# Patient Record
Sex: Male | Born: 1945
Health system: Southern US, Community
[De-identification: ages and names within clinical notes are randomized; demographics above are authoritative.]

## PROBLEM LIST (undated history)

## (undated) DIAGNOSIS — Z85528 Personal history of other malignant neoplasm of kidney: Secondary | ICD-10-CM

## (undated) DIAGNOSIS — I739 Peripheral vascular disease, unspecified: Secondary | ICD-10-CM

## (undated) DIAGNOSIS — C679 Malignant neoplasm of bladder, unspecified: Secondary | ICD-10-CM

## (undated) DIAGNOSIS — F32A Depression, unspecified: Secondary | ICD-10-CM

## (undated) DIAGNOSIS — J189 Pneumonia, unspecified organism: Secondary | ICD-10-CM

## (undated) DIAGNOSIS — F419 Anxiety disorder, unspecified: Secondary | ICD-10-CM

## (undated) DIAGNOSIS — J449 Chronic obstructive pulmonary disease, unspecified: Secondary | ICD-10-CM

## (undated) DIAGNOSIS — K219 Gastro-esophageal reflux disease without esophagitis: Secondary | ICD-10-CM

## (undated) DIAGNOSIS — F329 Major depressive disorder, single episode, unspecified: Secondary | ICD-10-CM

## (undated) HISTORY — PX: CHOLECYSTECTOMY: SHX55

## (undated) HISTORY — PX: HERNIA REPAIR: SHX51

## (undated) HISTORY — DX: Pneumonia, unspecified organism: J18.9

## (undated) HISTORY — DX: Personal history of other malignant neoplasm of kidney: Z85.528

## (undated) HISTORY — DX: Malignant neoplasm of bladder, unspecified: C67.9

## (undated) HISTORY — PX: TONSILLECTOMY: SUR1361

---

## 1994-03-25 HISTORY — PX: PROSTATECTOMY: SHX69

## 1994-03-25 HISTORY — PX: CYSTECTOMY W/ CONTINENT DIVERSION: SUR360

## 1997-08-15 ENCOUNTER — Ambulatory Visit (HOSPITAL_COMMUNITY): Admission: RE | Admit: 1997-08-15 | Discharge: 1997-08-16 | Payer: Self-pay | Admitting: Surgery

## 1998-03-25 HISTORY — PX: COLONOSCOPY: SHX174

## 2002-01-08 ENCOUNTER — Inpatient Hospital Stay (HOSPITAL_COMMUNITY): Admission: EM | Admit: 2002-01-08 | Discharge: 2002-01-14 | Payer: Self-pay

## 2002-04-29 ENCOUNTER — Ambulatory Visit (HOSPITAL_COMMUNITY): Admission: RE | Admit: 2002-04-29 | Discharge: 2002-04-29 | Payer: Self-pay | Admitting: Gastroenterology

## 2005-02-11 ENCOUNTER — Emergency Department: Payer: Self-pay | Admitting: Emergency Medicine

## 2005-02-11 ENCOUNTER — Other Ambulatory Visit: Payer: Self-pay

## 2005-09-20 ENCOUNTER — Emergency Department: Payer: Self-pay | Admitting: Emergency Medicine

## 2007-09-16 ENCOUNTER — Other Ambulatory Visit: Payer: Self-pay

## 2007-09-16 ENCOUNTER — Emergency Department: Payer: Self-pay | Admitting: Emergency Medicine

## 2008-06-12 ENCOUNTER — Emergency Department: Payer: Self-pay | Admitting: Emergency Medicine

## 2010-09-25 ENCOUNTER — Inpatient Hospital Stay: Payer: Self-pay | Admitting: Internal Medicine

## 2011-03-29 ENCOUNTER — Emergency Department: Payer: Self-pay | Admitting: *Deleted

## 2011-03-29 LAB — COMPREHENSIVE METABOLIC PANEL
Alkaline Phosphatase: 70 U/L (ref 50–136)
Bilirubin,Total: 0.6 mg/dL (ref 0.2–1.0)
Calcium, Total: 9.4 mg/dL (ref 8.5–10.1)
Chloride: 109 mmol/L — ABNORMAL HIGH (ref 98–107)
Co2: 21 mmol/L (ref 21–32)
Creatinine: 1.08 mg/dL (ref 0.60–1.30)
EGFR (African American): >60
EGFR (Non-African Amer.): >60
Osmolality: 281 (ref 275–301)
Potassium: 4.2 mmol/L (ref 3.5–5.1)
SGPT (ALT): 18 U/L

## 2011-03-29 LAB — CBC
HCT: 48.2 % (ref 40.0–52.0)
MCV: 90 fL (ref 80–100)
Platelet: 158 10*3/uL (ref 150–440)
RBC: 5.37 10*6/uL (ref 4.40–5.90)
WBC: 6.7 10*3/uL (ref 3.8–10.6)

## 2011-03-29 LAB — TROPONIN I: Troponin-I: <0.02 ng/mL

## 2011-04-12 ENCOUNTER — Inpatient Hospital Stay: Payer: Self-pay | Admitting: Surgery

## 2011-04-12 LAB — COMPREHENSIVE METABOLIC PANEL
Albumin: 4.1 g/dL (ref 3.4–5.0)
Anion Gap: 11 (ref 7–16)
BUN: 15 mg/dL (ref 7–18)
Bilirubin,Total: 0.8 mg/dL (ref 0.2–1.0)
Chloride: 102 mmol/L (ref 98–107)
Co2: 25 mmol/L (ref 21–32)
EGFR (African American): >60
EGFR (Non-African Amer.): >60
Glucose: 134 mg/dL — ABNORMAL HIGH (ref 65–99)
Osmolality: 278 (ref 275–301)
Potassium: 4.9 mmol/L (ref 3.5–5.1)
SGOT(AST): 15 U/L (ref 15–37)
Sodium: 138 mmol/L (ref 136–145)

## 2011-04-12 LAB — CBC WITH DIFFERENTIAL/PLATELET
Basophil #: 0 10*3/uL (ref 0.0–0.1)
Basophil %: 0.2 %
Eosinophil #: 0 10*3/uL (ref 0.0–0.7)
HCT: 50 % (ref 40.0–52.0)
HGB: 16.8 g/dL (ref 13.0–18.0)
Lymphocyte #: 0.8 10*3/uL — ABNORMAL LOW (ref 1.0–3.6)
Lymphocyte %: 6.5 %
MCH: 30.1 pg (ref 26.0–34.0)
MCHC: 33.7 g/dL (ref 32.0–36.0)
MCV: 89 fL (ref 80–100)
Neutrophil #: 10.7 10*3/uL — ABNORMAL HIGH (ref 1.4–6.5)

## 2011-04-13 LAB — CBC WITH DIFFERENTIAL/PLATELET
Basophil %: 0.1 %
Eosinophil #: 0 10*3/uL (ref 0.0–0.7)
Eosinophil %: 0.2 %
HCT: 50.3 % (ref 40.0–52.0)
HGB: 16.8 g/dL (ref 13.0–18.0)
Lymphocyte #: 1.5 10*3/uL (ref 1.0–3.6)
Lymphocyte %: 12.2 %
MCH: 29.9 pg (ref 26.0–34.0)
MCV: 90 fL (ref 80–100)
Monocyte #: 0.7 10*3/uL (ref 0.0–0.7)
Monocyte %: 5.3 %
Neutrophil #: 10.3 10*3/uL — ABNORMAL HIGH (ref 1.4–6.5)
Neutrophil %: 82.2 %
Platelet: 227 10*3/uL (ref 150–440)
RBC: 5.62 10*6/uL (ref 4.40–5.90)
WBC: 12.6 10*3/uL — ABNORMAL HIGH (ref 3.8–10.6)

## 2011-04-13 LAB — URINALYSIS, COMPLETE
Bacteria: NONE SEEN
Bilirubin,UR: NEGATIVE
Glucose,UR: NEGATIVE mg/dL (ref 0–75)
Nitrite: NEGATIVE
Protein: 100
Specific Gravity: 1.034 (ref 1.003–1.030)
Squamous Epithelial: NONE SEEN

## 2011-04-13 LAB — COMPREHENSIVE METABOLIC PANEL
Albumin: 3.6 g/dL (ref 3.4–5.0)
Alkaline Phosphatase: 62 U/L (ref 50–136)
Anion Gap: 14 (ref 7–16)
BUN: 17 mg/dL (ref 7–18)
Bilirubin,Total: 0.7 mg/dL (ref 0.2–1.0)
Calcium, Total: 9.2 mg/dL (ref 8.5–10.1)
Chloride: 99 mmol/L (ref 98–107)
Co2: 27 mmol/L (ref 21–32)
Creatinine: 1.2 mg/dL (ref 0.60–1.30)
EGFR (African American): >60
Glucose: 149 mg/dL — ABNORMAL HIGH (ref 65–99)
Osmolality: 284 (ref 275–301)
Potassium: 4.6 mmol/L (ref 3.5–5.1)
Sodium: 140 mmol/L (ref 136–145)

## 2011-04-14 LAB — CBC WITH DIFFERENTIAL/PLATELET
Basophil #: 0 10*3/uL (ref 0.0–0.1)
Basophil %: 0.4 %
Eosinophil %: 1.1 %
HCT: 42.7 % (ref 40.0–52.0)
HGB: 14.3 g/dL (ref 13.0–18.0)
Lymphocyte #: 1.6 10*3/uL (ref 1.0–3.6)
MCH: 29.7 pg (ref 26.0–34.0)
MCV: 89 fL (ref 80–100)
Monocyte #: 0.6 10*3/uL (ref 0.0–0.7)
Monocyte %: 8 %
Neutrophil #: 4.7 10*3/uL (ref 1.4–6.5)
RDW: 14.4 % (ref 11.5–14.5)
WBC: 6.9 10*3/uL (ref 3.8–10.6)

## 2011-06-07 ENCOUNTER — Telehealth: Payer: Self-pay

## 2011-06-07 NOTE — Telephone Encounter (Signed)
I spoke with pt and he stated he has his last CXR on disk and will bring it to his OV on Monday with MW. Nothing further was needed

## 2011-06-10 ENCOUNTER — Ambulatory Visit (INDEPENDENT_AMBULATORY_CARE_PROVIDER_SITE_OTHER): Payer: Medicare Other | Admitting: Internal Medicine

## 2011-06-10 ENCOUNTER — Encounter: Payer: Self-pay | Admitting: Internal Medicine

## 2011-06-10 VITALS — BP 132/88 | HR 88 | Temp 97.8°F | Ht 68.0 in | Wt 181.2 lb

## 2011-06-10 DIAGNOSIS — R911 Solitary pulmonary nodule: Secondary | ICD-10-CM

## 2011-06-10 DIAGNOSIS — J449 Chronic obstructive pulmonary disease, unspecified: Secondary | ICD-10-CM

## 2011-06-10 DIAGNOSIS — F17200 Nicotine dependence, unspecified, uncomplicated: Secondary | ICD-10-CM

## 2011-06-10 NOTE — Progress Notes (Signed)
  Subjective:    Patient ID: Brady Smith, male    DOB: 15-Mar-1946  MRN: [id number]  HPI  42 yowm smoker with new onset cough/sob Aug 2012 > admitted to [facility] x 3 with dx of ? pna vs aecopd  with last admit Apr 24 2011(records requested) > some better refer by Dr Asencion Partridge 06/10/11 to pulmonary clinic  06/10/2011 1st pulmonary ov still actively smoking very sendentary denies cough but sob with more than slow adls.  No active sinus or hb symptoms. SOB variable and not directly related to coughing, some better p saba but technique very poor (see below).  All started acutely in Aug 2012 and never the same since but admits he is quite a bit better each time he leaves hosp p exacerbation and continues baseline doe at time of first ov s another flare since d/c despite still actively smoking.   Sleeping ok without nocturnal  or early am exacerbation  of respiratory  c/o's or need for noct saba. Also denies any obvious fluctuation of symptoms with weather or environmental changes or other aggravating or alleviating factors except as outlined above   Review of Systems  Constitutional: Negative for fever and unexpected weight change.  HENT: Negative for ear pain, nosebleeds, congestion, sore throat, rhinorrhea, sneezing, trouble swallowing, dental problem, postnasal drip and sinus pressure.   Eyes: Negative for redness and itching.  Respiratory: Positive for shortness of breath. Negative for cough, chest tightness and wheezing.   Cardiovascular: Negative for palpitations and leg swelling.  Gastrointestinal: Negative for nausea and vomiting.  Genitourinary: Negative for dysuria.  Musculoskeletal: Negative for joint swelling.  Skin: Negative for rash.  Neurological: Negative for headaches.  Hematological: Does not bruise/bleed easily.  Psychiatric/Behavioral: Negative for dysphoric mood. The patient is not nervous/anxious.        Objective:   Physical Exam Obese wm poor dentition ambulatory nad  but  failed to answer a single question asked in a straightforward manner, tending to go off on tangents or answer questions with ambiguous medical terms or diagnoses and seemed perplexed to the point of bewilderment  when asked the same question more than once for clarification.  HEENT mild turbinate edema.  Oropharynx no thrush or excess pnd or cobblestoning.  No JVD or cervical adenopathy. Mild accessory muscle hypertrophy. Trachea midline, nl thryroid. Chest was mildly  hyperinflated by percussion with diminished breath sounds and moderate increased exp time without wheeze. Hoover sign positive at inspinspiration. Regular rate and rhythm without murmur gallop or rub or increase P2 or edema.  Abd:pot belly deformity  no hsm, nl excursion. Ext warm without cyanosis or clubbing.    cxr 03/29/11 7 mm LL SPN     Assessment & Plan:

## 2011-06-10 NOTE — Patient Instructions (Signed)
Stop smoking before it stops you, this is the most important aspect of your care  We are obtaining your records from your last hospitalization  Please schedule a follow up office visit in 4 weeks, sooner if needed with cxr and pfts

## 2011-06-11 DIAGNOSIS — R911 Solitary pulmonary nodule: Secondary | ICD-10-CM | POA: Insufficient documentation

## 2011-06-11 DIAGNOSIS — J449 Chronic obstructive pulmonary disease, unspecified: Secondary | ICD-10-CM | POA: Insufficient documentation

## 2011-06-11 DIAGNOSIS — J441 Chronic obstructive pulmonary disease with (acute) exacerbation: Secondary | ICD-10-CM | POA: Insufficient documentation

## 2011-06-11 DIAGNOSIS — F17200 Nicotine dependence, unspecified, uncomplicated: Secondary | ICD-10-CM | POA: Insufficient documentation

## 2011-06-11 HISTORY — DX: Chronic obstructive pulmonary disease with (acute) exacerbation: J44.1

## 2011-06-11 NOTE — Assessment & Plan Note (Signed)
Discussed in detail all the  indications, usual  risks and alternatives  relative to the benefits with patient who agrees to proceed with conservative f/u for now, placed in tickle file for recall

## 2011-06-11 NOTE — Assessment & Plan Note (Signed)
Extremely poor hfa technique gives me hope that he really isn't that bad and needs to focus his attention on smoking cessation  I took an extended  opportunity with this patient to outline the consequences of continued cigarette use  in airway disorders based on all the data we have from the multiple national lung health studies (perfomed over decades at millions of dollars in cost)  indicating that smoking cessation, not choice of inhalers or physicians, is the most important aspect of care.    We need a baseline pft  The proper method of use, as well as anticipated side effects, of a metered-dose inhaler are discussed and demonstrated to the patient. Improved effectiveness after extensive coaching during this visit to a level of approximately  50% but no better

## 2011-06-11 NOTE — Assessment & Plan Note (Signed)

## 2011-06-24 ENCOUNTER — Telehealth: Payer: Self-pay | Admitting: Internal Medicine

## 2011-06-24 NOTE — Telephone Encounter (Signed)
Received copies from [facility] ,on  06/24/11. Forwarded 3 pages to Dr. Leota Jacobsen review.

## 2011-07-12 ENCOUNTER — Ambulatory Visit: Payer: Medicare Other | Admitting: Internal Medicine

## 2011-07-19 ENCOUNTER — Telehealth: Payer: Self-pay | Admitting: Internal Medicine

## 2011-07-19 ENCOUNTER — Telehealth: Payer: Self-pay | Admitting: *Deleted

## 2011-07-19 DIAGNOSIS — J449 Chronic obstructive pulmonary disease, unspecified: Secondary | ICD-10-CM

## 2011-07-19 DIAGNOSIS — E854 Organ-limited amyloidosis: Secondary | ICD-10-CM

## 2011-07-19 DIAGNOSIS — R911 Solitary pulmonary nodule: Secondary | ICD-10-CM

## 2011-07-19 NOTE — Telephone Encounter (Signed)
LMTCB for pt- he just needs ov with May with CXR and PFT's.

## 2011-07-19 NOTE — Telephone Encounter (Signed)
Pt requests to schedule his pft and cxr at [facility], he also wants to schedule appt after this with dr Kendrick Fries.Raylene Everts

## 2011-07-19 NOTE — Telephone Encounter (Signed)
Error.Brady Smith ° °

## 2011-07-19 NOTE — Telephone Encounter (Signed)
Dr. Sherene Sires, is this okay to schedule PFT and cxr at [facility] and then have the pt f/u with Dr. Kendrick Fries afterwards- he lives in [place]. Please advise, thanks!

## 2011-07-19 NOTE — Telephone Encounter (Signed)
Message copied by Christen Butter on Fri Jul 19, 2011 12:03 PM ------      Message from: Nyoka Cowden      Created: Tue Jun 11, 2011  9:05 AM       Make sure he's had ov with cxr by now

## 2011-07-19 NOTE — Telephone Encounter (Signed)
Yes ok with me

## 2011-07-22 NOTE — Telephone Encounter (Signed)
Orders sent to [facility] for cxr and PFT LMTCB for pt

## 2011-07-23 ENCOUNTER — Telehealth: Payer: Self-pay | Admitting: Internal Medicine

## 2011-07-23 NOTE — Telephone Encounter (Signed)
ATC pt at # provided above - NA and no option to leave VM.  WCB

## 2011-07-23 NOTE — Telephone Encounter (Signed)
I spoke with pt and he states that he has not heard anything regarding the past 9 cxr he has had sine august. According to phone note 07/19/11 pt is going to be scheduled for PFT and CXR and a consult apt will be made with Dr. Kendrick Fries since pt is wanting to see him in [place] since that is where he lives. I triad calling pt back but NA [facility]

## 2011-07-24 NOTE — Telephone Encounter (Signed)
ATC pt NA WCB 

## 2011-07-24 NOTE — Telephone Encounter (Signed)
Called cell number and left message for pt to return my call to schedule appointment for PFT, CXR and appointment with Dr. Kendrick Fries in [place].

## 2011-07-25 NOTE — Telephone Encounter (Signed)
Called sister's number again today and sister gave patient my message, however, she stated that she can't make him call. Sister stated that she felt like patient was afraid to have any tests and maybe that is why he is not returning our calls. Sister stated that she would tell patient that I called again. I will attempt to contact patient after 5:00 pm at the other phone number listed.

## 2011-07-26 NOTE — Telephone Encounter (Signed)
ATc pt na [facility]

## 2011-07-26 NOTE — Telephone Encounter (Signed)
Attempted to reach patient on [phone] and did not reach anyone.

## 2011-07-30 NOTE — Telephone Encounter (Signed)
ATC NA wcb 

## 2011-07-31 ENCOUNTER — Encounter: Payer: Self-pay | Admitting: *Deleted

## 2011-07-31 NOTE — Telephone Encounter (Signed)
We have made several attempts to reach this pt, I have called his brother back again this morning and the pt does not live at this address or phone number but he does get his mail there, the pt does not have a permanent address this is why he uses his brother's information--per the brother. I explained as much as I could about why we are calling without breaking any HIPPA violations, by this I mean I told the brother it is very important that he call us back to set him an appt with dr Kendrick Fries in [place], which was a request from the patient since this would be closer to home, I also told the brother that there were repeated test that were needed and we really needed to take care of this for the patient.  I told the brother at this point we will sign off on this message since the brother and the sister are getting tired of our repeated calls to them, the brother states he understands why we are calling and both him and the sister have given the patient the repeated messages we have left, the brother states " you guys have done your part and so have we but we can not make him call back to take care of this". I stated to the brother that I understand his frustration and will not call back for this matter but instead we will mail a letter to the address we have on file and if this is his (the brother) Brady Smith he would just give the letter to patient when he sees him next. At this point we will sign off on this message. After this conversation with the patient's brother I spoke to [facility] and she will generate the letter and will mail this out today. At this point we will wait on the patient to call us back since we have no other numbers or addresses to contact him at. Letter was done while I typed this and has Smith placed in the outgoing mail box.

## 2011-07-31 NOTE — Telephone Encounter (Signed)
Called [phone] - a gentleman answered and was advised that Lacinda Axon does not live there.    Called pt's sister, Nickolas Madrid, at [phone] - spoke with pt's sister and she will ask him to call our office back.

## 2013-02-01 ENCOUNTER — Observation Stay: Payer: Self-pay | Admitting: Surgery

## 2013-02-01 LAB — CK TOTAL AND CKMB (NOT AT ARMC): CK, Total: 79 U/L (ref 35–232)

## 2013-02-01 LAB — COMPREHENSIVE METABOLIC PANEL
Albumin: 4 g/dL (ref 3.4–5.0)
Anion Gap: 5 — ABNORMAL LOW (ref 7–16)
BUN: 19 mg/dL — ABNORMAL HIGH (ref 7–18)
Calcium, Total: 9.6 mg/dL (ref 8.5–10.1)
Chloride: 102 mmol/L (ref 98–107)
Creatinine: 1.1 mg/dL (ref 0.60–1.30)
EGFR (African American): >60
EGFR (Non-African Amer.): >60
Glucose: 130 mg/dL — ABNORMAL HIGH (ref 65–99)
Potassium: 4.2 mmol/L (ref 3.5–5.1)
SGOT(AST): 19 U/L (ref 15–37)
SGPT (ALT): 23 U/L (ref 12–78)
Total Protein: 7.6 g/dL (ref 6.4–8.2)

## 2013-02-01 LAB — URINALYSIS, COMPLETE
Bilirubin,UR: NEGATIVE
Blood: NEGATIVE
Nitrite: NEGATIVE
Specific Gravity: 1.01 (ref 1.003–1.030)
Squamous Epithelial: NONE SEEN
WBC UR: 23 /HPF (ref 0–5)

## 2013-02-01 LAB — CBC
HCT: 48.5 % (ref 40.0–52.0)
HGB: 16.8 g/dL (ref 13.0–18.0)
MCHC: 34.7 g/dL (ref 32.0–36.0)
MCV: 92 fL (ref 80–100)
RDW: 13.9 % (ref 11.5–14.5)

## 2013-02-02 LAB — CBC WITH DIFFERENTIAL/PLATELET
Basophil #: 0 10*3/uL (ref 0.0–0.1)
Basophil %: 0.5 %
Eosinophil %: 0.8 %
HCT: 45.6 % (ref 40.0–52.0)
Lymphocyte #: 0.7 10*3/uL — ABNORMAL LOW (ref 1.0–3.6)
Lymphocyte %: 8.8 %
MCH: 31.9 pg (ref 26.0–34.0)
MCHC: 34.4 g/dL (ref 32.0–36.0)
Monocyte #: 0.7 x10 3/mm (ref 0.2–1.0)
Monocyte %: 9.2 %
Neutrophil %: 80.7 %
Platelet: 190 10*3/uL (ref 150–440)
RBC: 4.91 10*6/uL (ref 4.40–5.90)
RDW: 13.8 % (ref 11.5–14.5)
WBC: 7.7 10*3/uL (ref 3.8–10.6)

## 2013-02-02 LAB — BASIC METABOLIC PANEL
BUN: 18 mg/dL (ref 7–18)
Calcium, Total: 8.9 mg/dL (ref 8.5–10.1)
Chloride: 106 mmol/L (ref 98–107)
Co2: 26 mmol/L (ref 21–32)
Creatinine: 1.23 mg/dL (ref 0.60–1.30)
EGFR (African American): >60
EGFR (Non-African Amer.): >60
Glucose: 127 mg/dL — ABNORMAL HIGH (ref 65–99)
Potassium: 4.4 mmol/L (ref 3.5–5.1)
Sodium: 136 mmol/L (ref 136–145)

## 2013-07-27 ENCOUNTER — Inpatient Hospital Stay: Payer: Self-pay | Admitting: Internal Medicine

## 2013-07-27 LAB — COMPREHENSIVE METABOLIC PANEL
ALBUMIN: 3.9 g/dL (ref 3.4–5.0)
ALK PHOS: 81 U/L
AST: 17 U/L (ref 15–37)
Anion Gap: 4 — ABNORMAL LOW (ref 7–16)
BILIRUBIN TOTAL: 0.5 mg/dL (ref 0.2–1.0)
BUN: 8 mg/dL (ref 7–18)
CHLORIDE: 105 mmol/L (ref 98–107)
CO2: 26 mmol/L (ref 21–32)
Calcium, Total: 9.2 mg/dL (ref 8.5–10.1)
Creatinine: 1.26 mg/dL (ref 0.60–1.30)
EGFR (African American): >60
EGFR (Non-African Amer.): 59 — ABNORMAL LOW
GLUCOSE: 106 mg/dL — AB (ref 65–99)
Osmolality: 269 (ref 275–301)
POTASSIUM: 4.6 mmol/L (ref 3.5–5.1)
SGPT (ALT): 20 U/L (ref 12–78)
Sodium: 135 mmol/L — ABNORMAL LOW (ref 136–145)
TOTAL PROTEIN: 7.5 g/dL (ref 6.4–8.2)

## 2013-07-27 LAB — CBC WITH DIFFERENTIAL/PLATELET
Basophil #: 0.1 x10 3/mm 3
Basophil %: 0.8 %
Eosinophil #: 0 x10 3/mm 3
Eosinophil %: 0.2 %
HCT: 50.5 %
HGB: 16.7 g/dL
Lymphocyte %: 7.6 %
Lymphs Abs: 0.9 x10 3/mm 3 — ABNORMAL LOW
MCH: 31.2 pg
MCHC: 33.2 g/dL
MCV: 94 fL
Monocyte #: 0.6 "x10 3/mm "
Monocyte %: 5.5 %
Neutrophil #: 9.7 x10 3/mm 3 — ABNORMAL HIGH
Neutrophil %: 85.9 %
Platelet: 211 x10 3/mm 3
RBC: 5.37 x10 6/mm 3
RDW: 14.4 %
WBC: 11.3 x10 3/mm 3 — ABNORMAL HIGH

## 2013-07-27 LAB — URINALYSIS, COMPLETE
BILIRUBIN, UR: NEGATIVE
GLUCOSE, UR: NEGATIVE mg/dL (ref 0–75)
Hyaline Cast: 1
Ketone: NEGATIVE
LEUKOCYTE ESTERASE: NEGATIVE
Nitrite: NEGATIVE
PH: 7 (ref 4.5–8.0)
PROTEIN: NEGATIVE
RBC,UR: 6 /HPF (ref 0–5)
Specific Gravity: 1.01 (ref 1.003–1.030)
Squamous Epithelial: NONE SEEN
WBC UR: 15 /HPF (ref 0–5)

## 2013-07-27 LAB — LIPASE, BLOOD: Lipase: 99 U/L (ref 73–393)

## 2013-07-28 LAB — CBC WITH DIFFERENTIAL/PLATELET
BASOS ABS: 0 10*3/uL (ref 0.0–0.1)
Basophil %: 0.5 %
EOS ABS: 0.1 10*3/uL (ref 0.0–0.7)
EOS PCT: 1.1 %
HCT: 45 % (ref 40.0–52.0)
HGB: 14.9 g/dL (ref 13.0–18.0)
Lymphocyte #: 1.3 10*3/uL (ref 1.0–3.6)
Lymphocyte %: 16.1 %
MCH: 31.2 pg (ref 26.0–34.0)
MCHC: 33.2 g/dL (ref 32.0–36.0)
MCV: 94 fL (ref 80–100)
Monocyte #: 0.6 x10 3/mm (ref 0.2–1.0)
Monocyte %: 7 %
Neutrophil #: 6 10*3/uL (ref 1.4–6.5)
Neutrophil %: 75.3 %
PLATELETS: 164 10*3/uL (ref 150–440)
RBC: 4.79 10*6/uL (ref 4.40–5.90)
RDW: 14.6 % — AB (ref 11.5–14.5)
WBC: 8 10*3/uL (ref 3.8–10.6)

## 2013-07-28 LAB — BASIC METABOLIC PANEL
Anion Gap: 4 — ABNORMAL LOW (ref 7–16)
BUN: 6 mg/dL — ABNORMAL LOW (ref 7–18)
CALCIUM: 8.7 mg/dL (ref 8.5–10.1)
Chloride: 111 mmol/L — ABNORMAL HIGH (ref 98–107)
Co2: 26 mmol/L (ref 21–32)
Creatinine: 1.13 mg/dL (ref 0.60–1.30)
EGFR (African American): >60
GLUCOSE: 86 mg/dL (ref 65–99)
OSMOLALITY: 278 (ref 275–301)
POTASSIUM: 4.6 mmol/L (ref 3.5–5.1)
SODIUM: 141 mmol/L (ref 136–145)

## 2014-07-14 ENCOUNTER — Emergency Department
Admit: 2014-07-14 | Disposition: A | Discharge status: Home / Self Care | Payer: Self-pay | Admitting: Emergency Medicine

## 2014-07-15 NOTE — H&P (Signed)
Subjective/Chief Complaint vomiting and abdominal pain   History of Present Illness 69 y/o male with history of large ventral hernia for many years presents with 2 days of abdominal pain , nausea and emesis.  passing flatus and had small BM this am. work-up in ER concerning for sbo secondary to incisional ventral hernia. He does not think that hernia is any larger than it has been over the last several years. complex past surgical hx consisting of cystectomy and bladder reconstruction 20 years ago and at lest two previous hernia repairs with mesh. he tends to meander during the history taking.   Past History bladder CA tobacco abuse and dependence cholesyctectomy  hernia.   Past Med/Surgical Hx:  Raynaud's Disease:   Pancreatitis:   Cancer, Bladder:   Hypercholesterolemia:   Other, see comments: Pt reports Dr Humphrey Rolls told him he has a clogged artery.  Cholecystectomy:   ALLERGIES:  NKA: None   Other Allergies none   Family and Social History:  Family History Non-Contributory   Social History positive  tobacco, positive  tobacco (Current within 1 year), positive ETOH, negative Illicit drugs   + Tobacco Current (within 1 year)   Place of Living Home   Review of Systems:  Subjective/Chief Complaint see above   Abdominal Pain Yes   Diarrhea No   Constipation No   Nausea/Vomiting Yes   Physical Exam:  GEN obese   HEENT pale conjunctivae, PERRL   NECK supple   RESP normal resp effort  clear BS   CARD regular rate   ABD denies tenderness  positive hernia  soft  massive ventral hernia, soft and non-tender, partially reducible.   LYMPH negative neck   EXTR negative cyanosis/clubbing, negative edema   SKIN normal to palpation   PSYCH A+O to time, place, person   Lab Results: Hepatic:  10-Nov-14 10:50   Bilirubin, Total 0.9  Alkaline Phosphatase 79  SGPT (ALT) 23  SGOT (AST) 19  Total Protein, Serum 7.6  Albumin, Serum 4.0  Lab:  10-Nov-14 11:00    Lactic Acid, Cardiopulmonary 1.3 (Result(s) reported on 01 Feb 2013 at 11:05AM.)  Routine Chem:  10-Nov-14 10:50   Lipase 81 (Result(s) reported on 01 Feb 2013 at 11:30AM.)  Glucose, Serum  130  BUN  19  Creatinine (comp) 1.10  Sodium, Serum  135  Potassium, Serum 4.2  Chloride, Serum 102  CO2, Serum 28  Calcium (Total), Serum 9.6  Osmolality (calc) 274  eGFR (African American) >60  eGFR (Non-African American) >60 (eGFR values <45m/min/1.73 m2 may be an indication of chronic kidney disease (CKD). Calculated eGFR is useful in patients with stable renal function. The eGFR calculation will not be reliable in acutely ill patients when serum creatinine is changing rapidly. It is not useful in  patients on dialysis. The eGFR calculation may not be applicable to patients at the low and high extremes of body sizes, pregnant women, and vegetarians.)  Anion Gap  5  Cardiac:  10-Nov-14 10:50   CK, Total 79  CPK-MB, Serum 1.4 (Result(s) reported on 01 Feb 2013 at 11:33AM.)  Routine UA:  10-Nov-14 12:58   Color (UA) Amber  Clarity (UA) Cloudy  Glucose (UA) Negative  Bilirubin (UA) Negative  Ketones (UA) Trace  Specific Gravity (UA) 1.010  Blood (UA) Negative  pH (UA) 7.0  Protein (UA) 100 mg/dL  Nitrite (UA) Negative  Leukocyte Esterase (UA) Negative (Result(s) reported on 01 Feb 2013 at 01:41PM.)  RBC (UA) 5 /HPF  WBC (UA) 23 /  HPF  Bacteria (UA) TRACE  Epithelial Cells (UA) NONE SEEN  Mucous (UA) PRESENT  Amorphous Crystal (UA) PRESENT (Result(s) reported on 01 Feb 2013 at 01:41PM.)  Routine Hem:  10-Nov-14 10:50   WBC (CBC)  12.6  RBC (CBC) 5.28  Hemoglobin (CBC) 16.8  Hematocrit (CBC) 48.5  Platelet Count (CBC) 188 (Result(s) reported on 01 Feb 2013 at 11:18AM.)  MCV 92  MCH 31.9  MCHC 34.7  RDW 13.9   Radiology Results: XRay:    10-Nov-14 11:26, Abdomen 3 Way Includes PA Chest  Abdomen 3 Way Includes PA Chest  REASON FOR EXAM:    ab pain, vomiting  COMMENTS:        PROCEDURE: DXR - DXR ABDOMEN 3-WAY (INCL PA CXR)  - Feb 01 2013 11:26AM     CLINICAL DATA:  Abdominal pain, vomiting    EXAM:  ACUTE ABDOMEN SERIES    COMPARISON:  04/13/2011    FINDINGS:  Cardiomediastinal silhouette is unremarkable. No acute infiltrate or  pleural effusion. No pulmonary edema.  No free abdominal air. Distended small bowel loops are noted mid  abdomen with some air-fluid level suspicious for early bowel  obstruction or significant ileus.     IMPRESSION:  Distended small bowel loops are noted mid abdomen with air-fluid  level suspicious for early bowel obstruction or significant ileus.  Unremarkable stress that no acute disease within chest. No acute  disease within chest. No free abdominal air.      Electronically Signed    By: Lahoma Crocker M.D.    On: 02/01/2013 13:16     Verified By: Ephraim Hamburger, M.D.,  [place]:  PACS Image    10-Nov-14 13:06, CT Abdomen and Pelvis With Contrast  PACS Image  CT:  CT Abdomen and Pelvis With Contrast  REASON FOR EXAM:    (1) ab pain r/o SBO; (2) ab pain r/o SBo  COMMENTS:       PROCEDURE: CT  - CT ABDOMEN / PELVIS  W  - Feb 01 2013  1:06PM     CLINICAL DATA:  Generalized abdominal pain. Nausea and vomiting.  Bloating. History pancreatitis. Bladdercancer. Prior  cholecystectomy. Rule out small bowel obstruction.    EXAM:  CT ABDOMEN AND PELVIS WITH CONTRAST    TECHNIQUE:  Multidetector CT imaging of the abdomen and pelvis was performed  using the standard protocol following bolus administration of  intravenous contrast.    CONTRAST:  125 cc Isovue 370.    COMPARISON:  02/01/2013 plain film examination. 04/12/2011 CT.    FINDINGS:  Anterior abdominal wall hernia contains transverse colon, omentum  and small bowel loops. The small bowel loops enter the hernia twice.  The 2nd time small bowel enters the hernia causes obstruction with  proximal small bowel dilation and distal decompression. Prior  distal  small bowel resection with surgical staples noted.    No free intraperitoneal air or abnormal fluid collection.  Right base parenchymal changes have changed minimally since prior  exam with pleural thickening which may represent scarring.    Right coronary artery calcification.    Atherosclerotic type changes of the abdominal aorta with abdominal  aortic aneurysm measuring 3.2 x 2.9 cm versus prior 3 x 2.6 cm.  Complex plaque of the abdominal aorta. Fusiform dilation of the  celiac artery and moderate to marked narrowing of the superior  mesenteric artery. Narrowing of the right renal artery. Prominent  narrowing of the iliac arteries.    Thickening of the bladder wall  previously noted is not appreciated  on present examination. Left bladder wall diverticulum. Limited for  detecting bladder wall mucosal abnormality. No pelvic adenopathy.  Top-normal size gastrohepatic lymph node with short axis dimension  of 1 cm minimally changed.    Mild thickening distal esophagus. Etiology indeterminate.    Nonspecific tiny low-density lesion within the right aspect the  liver stable. Post cholecystectomy.    Splenomegaly with the spleen spanning over 15.8 cm.    Tiny renal lesions too small to adequately characterize and without  change possibly small cysts. No hydronephrosis.    No worrisome pancreatic lesion.  Adrenal gland enlargement greater left may represent hyperplasia and  is unchanged.    L5 pars defect bilaterally.  No worrisome bony destructive lesion.     IMPRESSION:  Anterior abdominal wall hernia contains transverse colon, omentum  and small bowel loops. Thesmallbowel loops enter the hernia twice.  The 2nd time small bowel enters the hernia causes obstruction with  proximal small bowel dilation and distal decompression. Prior distal  small bowel resection with surgical staples noted.    Atherosclerotic type changes of the abdominal aorta with abdominal  aortic  aneurysm measuring 3.2 x 2.9 cm versus prior 3 x 2.6 cm.  Complex plaque of the abdominal aorta. Fusiform dilation of the  celiac artery and moderate to marked narrowing of the superior  mesentericartery. Narrowing of the right renal artery. Prominent  narrowing of the iliac arteries.    Thickening of the bladder wall previously noted is not appreciated  on present examination. Left bladder wall diverticulum. Limited for  detecting bladder wall mucosal abnormality. No pelvic adenopathy.    Top-normal size gastrohepatic lymph node with short axis dimension  of 1 cm minimally changed.    Mild thickening distal esophagus. Etiology indeterminate.    Nonspecific tiny low-density lesion within the right aspect the  liver stable. Post cholecystectomy.  Splenomegaly with the spleen spanning over 15.8 cm.    Tiny renal lesions too small to adequately characterize and without  change possibly small cysts. No hydronephrosis.    No worrisome pancreatic lesion.    Adrenal gland enlargement greater left may represent hyperplasia and  is unchanged.      Electronically Signed    By: Chauncey Cruel M.D.    On: 02/01/2013 14:10     Verified By: Doug Sou, M.D.,    Assessment/Admission Diagnosis 69 y/o male with chronic massive hernia and nausea and emesis. although CT scan shows a transition point clinically patient does not appear obstructed.   Plan admit, hydrate, NGT if emesis continues and re-exam.  Overall very poor surgical candidate given size of hernia and significant tobacco abuse.  explained to him in detail.   Electronic Signatures: Sherri Rad (MD)  (Signed [phone] 17:16)  Authored: CHIEF COMPLAINT and HISTORY, PAST MEDICAL/SURGIAL HISTORY, ALLERGIES, Other Allergies, FAMILY AND SOCIAL HISTORY, REVIEW OF SYSTEMS, PHYSICAL EXAM, LABS, Radiology, ASSESSMENT AND PLAN   Last Updated: 10-Nov-14 17:16 by Sherri Rad (MD)

## 2014-07-15 NOTE — Discharge Summary (Signed)
PATIENT NAME:  Brady Smith, Brady Smith MR#:  [id number] DATE OF BIRTH:  [birth year]  DATE OF ADMISSION:  02/01/2013  DATE OF DISCHARGE:  02/02/2013  HOSPITAL COURSE SUMMARY: The patient was admitted with increasingly symptomatic ventral hernia. He had no signs of bowel obstruction. On hospital day #1, the patient continued to refuse an NG tube, despite having emesis. I did not think, based on his physical examination, that the patient required operative intervention. I recommended a nasogastric tube, as he continued to have emesis. X-rays at the time demonstrated passage of contrast in the colon along with several loops of dilated bowel. The patient desires an opinion elsewhere. I recommended to him continued observation as well as nasogastric tube insertion. The patient refused later, then signed himself out [place].     ____________________________ Jeannette How. Marina Gravel, MD mab:mr D: 02/07/2013 16:54:22 ET T: 02/07/2013 20:39:03 ET JOB#: [id number]  cc: Elta Guadeloupe A. Marina Gravel, MD, <Dictator> Tristan Proto A Raylynn Hersh MD ELECTRONICALLY SIGNED 02/08/2013 21:26

## 2014-07-16 NOTE — Consult Note (Signed)
PATIENT NAME:  Brady Smith, Brady Smith MR#:  [id number] DATE OF BIRTH:  1945-06-05  DATE OF CONSULTATION:  07/28/2013  CONSULTING PHYSICIAN:  Elta Guadeloupe A. Marina Gravel, MD  HISTORY OF PRESENT ILLNESS: A 69 year old white male with a known, chronically incarcerated massive ventral hernia, who I admitted back in November of last year with a similar type episode of abdominal pain and distention, who then gets admitted to the medical service with a several day history of nausea, abdominal pain, and worsening pain in his abdomen. Work-up in the Emergency Room consisted of a CT scan of the abdomen and pelvis with oral and IV contrast, which demonstrated a stable-appearing, large anterior abdominal wall hernia with partial small bowel obstruction. 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 He has had a cholecystectomy and appendectomy in the past. The hernia is of long-standing duration. Of note, the patient continues to smoke daily. He drinks beer on a daily basis. He smokes anywhere between 1/2 pack and 1 pack of cigarettes a day. He states that he is slowly diminishing how much he smokes. Of note, during his last admission, the patient was recommended for further treatment and left AGAINST MEDICAL ADVICE. The patient admits to passing gas today. No x-rays as yet have been performed.   ALLERGIES: None.   MEDICATIONS: At present none.   PAST MEDICAL HISTORY: As described above.   PAST SURGICAL HISTORY: As described above.   SOCIAL HISTORY: Smokes and drinks. No drug use.  REVIEW OF SYSTEMS: As described above.   PHYSICAL EXAMINATION: GENERAL: The patient is alert and oriented. His history is rather meandering during the process of the investigation.  VITAL SIGNS: Stable.  LUNGS: Clear.  ABDOMEN: Soft, distended with a large complex ventral wall hernia. It is partially reducible. It is unchanged in my opinion from previous examination in November.  EXTREMITIES: Warm and  well perfused.   DIAGNOSTIC DATA:  Laboratory values were reviewed. CT scan was reviewed.   IMPRESSION: Partial small bowel obstruction, intermittent. Chronically incarcerated massive ventral hernia and a very poor surgical candidate due to his continued tobacco abuse and dependence.   PLAN: Obtain 2-way of the abdomen. I suspect that as he is passing gas and really has a soft abdomen, his diet can be advanced to clears. I would recommend a tertiary care referral to a hernia center either at [place], [facility], or [facility] for consideration of complex abdominal wall hernia repair in this patient with significant comorbidities.   TOTAL TIME SPENT: 60 minutes.   ____________________________ Jeannette How Marina Gravel, MD mab:jcm D: 07/28/2013 17:20:13 ET T: 07/28/2013 20:27:15 ET JOB#: [id number]  cc: Elta Guadeloupe A. Marina Gravel, MD, <Dictator> Rube Sanchez A Rapheal Masso MD ELECTRONICALLY SIGNED 07/30/2013 10:10

## 2014-07-16 NOTE — H&P (Signed)
PATIENT NAME:  Brady Smith, Brady Smith MR#:  [id number] DATE OF BIRTH:  [birth year]  DATE OF ADMISSION:  07/27/2013  PRIMARY CARE PHYSICIAN:  Dr. Halina Maidens.  CHIEF COMPLAINT:  Abdominal pain in his hernia.   HISTORY OF PRESENT ILLNESS:  This is a 69 year old man who has had a hernia for numerous years.  He comes in with 10 out of 10 in intensity pain in his hernia site for the past 2-1/2 to three days.  He has had some intermittent nausea and vomiting.  He had a bowel movement yesterday at 6:00 p.m.  In the ER, he had a CT scan of the abdomen and pelvis with contrast that showed a large anterior wall hernia containing small bowel and colon and partial small bowel obstruction.  Stable surgical changes with reconstructed bladder, moderate hydronephrosis and bladder distention.  ER physician spoke with Dr. Marina Gravel on call who recommended a medical admission.   PAST MEDICAL HISTORY:   1.  Bladder cancer status post reconstructive surgery.   2.  Tobacco abuse.  3.  Ventral hernia.   PAST SURGICAL HISTORY:  Bladder reconstruction surgery with cholecystectomy and appendectomy at the same time.   ALLERGIES:  No known drug allergies.   MEDICATIONS:  None.   FAMILY HISTORY:  Father killed in a train accident.  Mother died at 63, possible MI.  She had breast cancer and stomach cancer.   SOCIAL HISTORY:  The patient smokes 1 pack per day, occasional beer.  No drug use.  Retired Environmental health practitioner.   REVIEW OF SYSTEMS:  CONSTITUTIONAL:  No fever, chills, or sweats.  Positive for fatigue.  EYES:  Decreased vision.  EARS, NOSE, MOUTH AND THROAT:  Decreased hearing, left ear.  Positive for sore throat.  No difficulty swallowing.  CARDIOVASCULAR:  Positive for chest pain.  No palpitations.  RESPIRATORY:  Positive for shortness of breath.  No cough.  No sputum.  No hemoptysis.  GASTROINTESTINAL:  Positive for nausea.  Positive for vomiting.  Positive for abdominal pain.  GENITOURINARY:  No burning on urination.  No  hematuria.  MUSCULOSKELETAL:  Positive for joint pain.  INTEGUMENT:  No rashes.  Positive for itching.  NEUROLOGIC:  No fainting or blackouts.  PSYCHIATRIC:  No anxiety or depression.  ENDOCRINE:  No thyroid problems.  HEMATOLOGIC AND LYMPHATIC:  No anemia.   PHYSICAL EXAMINATION: VITAL SIGNS:  Temperature 97.6, pulse 70, respirations 18, blood pressure 165/86, pulse ox 99% on room air.  GENERAL:  No respiratory distress.  EYES:  Conjunctivae and lids normal.  Pupils equal, round, and reactive to light.  Extraocular muscles intact.  No nystagmus.  EARS, NOSE, MOUTH AND THROAT:  Tympanic membranes:  No erythema.  Nasal mucosa:  No erythema.  Throat:  No erythema.  No exudate seen.  Lips and gums:  No lesions.  LUNGS:  Decreased breath sounds bilaterally.  No rhonchi, rales, or wheeze heard.  CARDIOVASCULAR:  S1, S2 soft.  No gallops, rubs, or murmurs heard.  Carotid upstroke 2+ bilaterally and no bruits.  Dorsalis pedis pulses 1+ bilaterally.  ABDOMEN:  Soft.  Positive tenderness and guarding over the hernia.  Decreased bowel sounds.  No organomegaly/splenomegaly.  EXTREMITIES:  No edema.  No cyanosis.  SKIN:  No ulcers or lesions.  Face looks like a long time smoker.  PSYCHIATRIC:  The patient is oriented to person, place, and time.  NEUROLOGIC:  Cranial nerves II through XII grossly intact.  Deep tendon reflexes 1+ bilateral lower extremities.   LABORATORY  AND RADIOLOGICAL DATA:  Lipase 99, glucose 106, BUN 8, creatinine 1.26, sodium 135, potassium 4.6, chloride 105, CO2 26, calcium 9.2.  Liver function tests normal range.  White blood cell count 11.3, hemoglobin and hematocrit 16.7 and 50.5, platelet count of 211.  Urinalysis 1+ blood, otherwise negative.  CT scan of the abdomen and pelvis shows large anterior abdominal wall hernia, appears stable, contains small bowel and colon and there is a partial small bowel obstruction.   ASSESSMENT AND PLAN: 1.  Partial small bowel obstruction in a  large ventral hernia.  Surgical consult ordered by Emergency Room physician.  Management as per surgery, if surgery is needed.  The patient is a moderate risk secondary to chronic obstructive pulmonary disease.  Trial of conservative management with intravenous fluids, NG tube and pain medications.  I am not sure if the patient will want to undergo surgery.  2.  Tobacco abuse and likely chronic obstructive pulmonary disease.  We will order a nicotine patch.  Smoking cessation counseling done three minutes by me.  3.  Elevated blood pressure, likely secondary to pain.  We will continue to monitor.  4.  As needed pain medications intravenous, as needed intravenous nausea medications.  Time spent on admission 55 minutes.   CODE STATUS:  THE PATIENT IS A FULL CODE.    ____________________________ Tana Conch. Leslye Peer, MD rjw:ea D: 07/27/2013 16:51:25 ET T: 07/27/2013 17:31:31 ET JOB#: [id number]  cc: Tana Conch. Leslye Peer, MD, <Dictator> Halina Maidens, MD Marisue Brooklyn MD ELECTRONICALLY SIGNED 08/01/2013 14:46

## 2014-07-16 NOTE — Discharge Summary (Signed)
Dates of Admission and Diagnosis:  Date of Admission 27-Jul-2013   Date of Discharge 29-Jul-2013   Admitting Diagnosis abdominal pain.   Final Diagnosis large abdominal wall hernea- with partial small bowel obstruction- resolving obstruction. Abdominal wall hernea- need to go to tertiary care surgical center as it will be a complicated sx. Tobacco abuse. High blood pressure due to pain.    Chief Complaint/History of Present Illness a 69 year old man who has had a hernia for numerous years.  He comes in with 10 out of 10 in intensity pain in his hernia site for the past 2-1/2 to three days.  He has had some intermittent nausea and vomiting.  He had a bowel movement yesterday at 6:00 p.m.  In the ER, he had a CT scan of the abdomen and pelvis with contrast that showed a large anterior wall hernia containing small bowel and colon and partial small bowel obstruction.  Stable surgical changes with reconstructed bladder, moderate hydronephrosis and bladder distention.  ER physician spoke with Dr. Marina Gravel on call who recommended a medical admission.   Allergies:  No Known Allergies:   Hepatic:  05-May-15 10:41   Bilirubin, Total 0.5  Alkaline Phosphatase 81 (45-117 NOTE: New Reference Range 02/12/13)  SGPT (ALT) 20  SGOT (AST) 17  Total Protein, Serum 7.5  Albumin, Serum 3.9  Routine Chem:  05-May-15 10:41   Glucose, Serum  106  BUN 8  Creatinine (comp) 1.26  Sodium, Serum  135  Potassium, Serum 4.6  Chloride, Serum 105  CO2, Serum 26  Calcium (Total), Serum 9.2  Anion Gap  4  Osmolality (calc) 269  eGFR (African American) >60  eGFR (Non-African American)  59 (eGFR values <60m/min/1.73 m2 may be an indication of chronic kidney disease (CKD). Calculated eGFR is useful in patients with stable renal function. The eGFR calculation will not be reliable in acutely ill patients when serum creatinine is changing rapidly. It is not useful in  patients on dialysis. The eGFR calculation  may not be applicable to patients at the low and high extremes of body sizes, pregnant women, and vegetarians.)  Lipase 99 (Result(s) reported on 27 Jul 2013 at 12:20PM.)  06-May-15 04:44   Glucose, Serum 86  BUN  6  Creatinine (comp) 1.13  Sodium, Serum 141  Potassium, Serum 4.6  Chloride, Serum  111  CO2, Serum 26  Calcium (Total), Serum 8.7  Anion Gap  4  Osmolality (calc) 278  eGFR (African American) >60  eGFR (Non-African American) >60 (eGFR values <664mmin/1.73 m2 may be an indication of chronic kidney disease (CKD). Calculated eGFR is useful in patients with stable renal function. The eGFR calculation will not be reliable in acutely ill patients when serum creatinine is changing rapidly. It is not useful in  patients on dialysis. The eGFR calculation may not be applicable to patients at the low and high extremes of body sizes, pregnant women, and vegetarians.)  Routine UA:  05-May-15 10:41   Color (UA) Yellow  Clarity (UA) Hazy  Glucose (UA) Negative  Bilirubin (UA) Negative  Ketones (UA) Negative  Specific Gravity (UA) 1.010  Blood (UA) 1+  pH (UA) 7.0  Protein (UA) Negative  Nitrite (UA) Negative  Leukocyte Esterase (UA) Negative (Result(s) reported on 27 Jul 2013 at 11:37AM.)  RBC (UA) 6 /HPF  WBC (UA) 15 /HPF  Bacteria (UA) TRACE  Epithelial Cells (UA) NONE SEEN  Mucous (UA) PRESENT  Hyaline Cast (UA) 1 /LPF (Result(s) reported on 27 Jul 2013 at 11:37AM.)  Routine Hem:  05-May-15 10:41   WBC (CBC)  11.3  RBC (CBC) 5.37  Hemoglobin (CBC) 16.7  Hematocrit (CBC) 50.5  Platelet Count (CBC) 211  MCV 94  MCH 31.2  MCHC 33.2  RDW 14.4  Neutrophil % 85.9  Lymphocyte % 7.6  Monocyte % 5.5  Eosinophil % 0.2  Basophil % 0.8  Neutrophil #  9.7  Lymphocyte #  0.9  Monocyte # 0.6  Eosinophil # 0.0  Basophil # 0.1 (Result(s) reported on 27 Jul 2013 at 11:14AM.)  06-May-15 04:44   WBC (CBC) 8.0  RBC (CBC) 4.79  Hemoglobin (CBC) 14.9  Hematocrit (CBC)  45.0  Platelet Count (CBC) 164  MCV 94  MCH 31.2  MCHC 33.2  RDW  14.6  Neutrophil % 75.3  Lymphocyte % 16.1  Monocyte % 7.0  Eosinophil % 1.1  Basophil % 0.5  Neutrophil # 6.0  Lymphocyte # 1.3  Monocyte # 0.6  Eosinophil # 0.1  Basophil # 0.0 (Result(s) reported on 28 Jul 2013 at 05:27AM.)   PERTINENT RADIOLOGY STUDIES: LabUnknown:    07-May-15 08:21, Abdomen Flat and Erect  PACS Image   CT:    05-May-15 15:13, CT Abdomen and Pelvis With Contrast  CT Abdomen and Pelvis With Contrast   REASON FOR EXAM:    (1) Abdominal pain; (2) Abdominal pain;    NOTE:   Nursing to Give Oral CT Contras  COMMENTS:       PROCEDURE: CT  - CT ABDOMEN / PELVIS  W  - Jul 27 2013  3:13PM     CLINICAL DATA:  Mid abdominal pain.    EXAM:  CT ABDOMEN AND PELVIS WITH CONTRAST    TECHNIQUE:  Multidetector CT imaging of the abdomen and pelvis was performed  using the standard protocol following bolus administration of  intravenous contrast.  CONTRAST:  100 cc Isovue 370    COMPARISON:  02/01/2013    FINDINGS:  The lung bases demonstrate a small right effusion with overlying  atelectasis. The left lung base is clear. The heart is normal in  size. No pericardial effusion.    The liver demonstrates mild diffuse fatty infiltration. No focal  hepatic lesions or intrahepatic biliary dilatation. The gallbladder  is surgically absent. No common bile duct dilatation. The pancreas  is normal. The spleen is normal in size. No focal lesions. Stable  small adrenal gland nodules, likely benign adenomas. The kidneys are  unremarkable. There is mild bilateral hydroureter and  hydronephrosis. The neobladder is distended.    The stomach and duodenum are unremarkable. There is a partial small  bowel obstruction due to the anterior abdominal wall hernia which  contains transverse colon and small bowel loops. The small bowel  loops exiting hernia are normal in caliber/decompressed and  proximally loops  are dilated in hernia and proximal to the hernia.  There is also fluid in the hernia sac and some inflammationof the  fat. No pneumatosis or free air.    No mesenteric or retroperitoneal mass or adenopathy. There is a  stable focal saccular aneurysm of the infrarenal aorta with maximal  measurements of 3.0 x 2.8 cm.    The reconstructed bladder is unremarkable except for mild  distention. No pelvic mass, adenopathy or free pelvic fluid  collections.     IMPRESSION:  .    1. Large anterior abdominal wall hernia appears stable. It contains  small bowel and colon and there is a partial small bowel  obstruction.  2.  Stable focal saccular aneurysm of the infrarenal abdominal aorta.  3. Small right effusion overlying atelectasis.  4. Stable surgical changes with reconstructed bladder. Moderate  hydroureteronephrosis and bladder distention.    Electronically Signed    By: Kalman Jewels M.D.    On: 07/27/2013 15:24         Verified By: Marlane Hatcher, M.D.,   Pertinent Past History:  Pertinent Past History 1.  Bladder cancer status post reconstructive surgery.   2.  Tobacco abuse.  3.  Ventral hernia.   Hospital Course:  Hospital Course *  Partial small bowel obstruction in a large ventral hernia.  Surgical consult appreciated, not a good candidate.    Conservative management-tolerated diet now.   need to go to [facility] care center as out pt.    * Tobacco abuse and likely chronic obstructive pulmonary disease.  nicotine patch.      curently no wheezing.  *  Elevated blood pressure, likely secondary to pain. continue to monitor.   Condition on Discharge Stable   Code Status:  Code Status Full Code   DISCHARGE INSTRUCTIONS HOME MEDS:  Medication Reconciliation: Patient's Home Medications at Discharge:     Medication Instructions  nicotine 21 mg/24 hr transdermal film, extended release  1 patch transdermal once a day     Physician's Instructions:  Diet  Regular   Activity Limitations As tolerated   Return to Work Not Applicable   Time frame for Follow Up Appointment 2-4 weeks   Other Comments advised to follow at [facility] for hernea repair surgery- if pt wish, But pt refusing to have a surgery.   Electronic Signatures: Vaughan Basta (MD)  (Signed 11-May-15 00:08)  Authored: ADMISSION DATE AND DIAGNOSIS, CHIEF COMPLAINT/HPI, Allergies, PERTINENT LABS, PERTINENT RADIOLOGY STUDIES, PERTINENT PAST HISTORY, HOSPITAL COURSE, DISCHARGE INSTRUCTIONS HOME MEDS, PATIENT INSTRUCTIONS   Last Updated: 11-May-15 00:08 by Vaughan Basta (MD)

## 2014-07-17 NOTE — Consult Note (Signed)
Brief Consult Note: Diagnosis: abd pain and VH.   Patient was seen by consultant.   Consult note dictated.   Recommend further assessment or treatment.   Discussed with Attending MD.   Comments: Noncompliant pt refusing NG tube for multiple emesis. "I just want pain medecine". Pt also has signif orthopnea and noct dyspnea suggestive of untreated CHF. Discussed with ED MD. Rec IM see pt for medical problems. If pt agrees to admission, will again rec NG decompression and repeat films.  Electronic Signatures: Florene Glen (MD)  (Signed 18-Jan-13 20:05)  Authored: Brief Consult Note   Last Updated: 18-Jan-13 20:05 by Florene Glen (MD)

## 2014-07-17 NOTE — Consult Note (Signed)
PATIENT NAME:  Brady Smith, Brady Smith MR#:  [id number] DATE OF BIRTH:  1945/09/14  DATE OF CONSULTATION:  04/12/2011  REFERRING PHYSICIAN:  Dr Burt Knack CONSULTING PHYSICIAN:  Baxter Hire, MD PRIMARY CARE PHYSICIAN:  Was Dr Lamonte Sakai but patient says he does not see her anymore and currently is unassigned.    REASON FOR CONSULTATION: Preop evaluation.   HISTORY OF PRESENT ILLNESS: This is a 69 year old male who comes in with abdominal pain and has a very large ventral hernia which is going to require surgical repair. The patient does have a history of chronic obstructive pulmonary disease although he says he does not know it.  He smokes 2 packs of cigarettes a day, wheezes occasionally, uses an inhaler at night that he has had ever since he got out of the hospital in July with chronic obstructive pulmonary disease exacerbation. He has had no recent history of chest pain or any other type of cardiac symptoms. He states he was catheterized by Dr. Humphrey Rolls years ago and he thought he said he had heart disease. I can find no records back to 2000 of any heart catheterizations.     PAST MEDICAL HISTORY:  1. Raynaud disease.  2. Chronic obstructive pulmonary disease.  3. History of bladder cancer.   SURGICAL HISTORY: Cholecystectomy.   ALLERGIES: No known drug allergies.   CURRENT MEDICATIONS:  An inhaler of some type at bedtime.   FAMILY HISTORY: Significant for diabetes and hypertension.   SOCIAL HISTORY: He smokes 2 packs of cigarettes a day. Uses alcohol occasionally.   REVIEW OF SYSTEMS: CONSTITUTIONAL: He has had no fever or chills. EYES: No blurred vision. ENT: No hearing loss. CARDIOVASCULAR: No chest pain. PULMONARY: He has had wheezing and shortness of breath. GI: He has had abdominal pain but no nausea, vomiting, or diarrhea. GU: No dysuria. ENDOCRINE: No heat or cold intolerance. INTEGUMENT: No rash. MUSCULOSKELETAL: Occasional joint pain. NEUROLOGIC: No numbness or weakness.   PHYSICAL  EXAMINATION:  VITAL SIGNS: Temperature is 97.9, pulse is 71, respirations 18, blood pressure 162/87.   GENERAL: This is a well-nourished white male in no acute distress.   HEENT: The pupils are equal, round, and reactive to light. The sclerae are mildly injected. Oral mucosa is moist. Oropharynx is clear. Nasopharynx is clear.   NECK: Supple. No JVD, lymphadenopathy, no thyromegaly.   CARDIOVASCULAR: Regular rate and rhythm. No murmurs, rubs, or gallops.   LUNGS: Very faint expiratory wheezing. No dullness to percussion. He is not using accessory muscles.   ABDOMEN: Soft, is distended around the umbilical area with a large ventral hernia that is tender. Bowel sounds are positive. No hepatosplenomegaly.   EXTREMITIES: There is no edema. No joint deformity.   NEUROLOGIC: Cranial nerves II through XII are intact. He is alert and oriented x4.   SKIN: Moist with no rash.   LABORATORY, DIAGNOSTIC, AND RADIOLOGICAL DATA: BUN is 15, creatinine is 1.12, sodium 138, potassium 4.9. White blood cells 11.9, hemoglobin 16.8.   ASSESSMENT AND PLAN:  1. Preoperative evaluation. Patient is at moderate risk for surgical procedures because of his chronic obstructive pulmonary disease history.  He is currently not having any exacerbation. I am going to go ahead and boost him up to some maintenance medications. EKG does not show any abnormalities so at this point I would recommend proceeding with the surgery.  2. Chronic obstructive pulmonary disease. He is not having an exacerbation.  I am going boost up his maintenance medications to include Advair and Spiriva  and just add some nebulizers p.r.n.  3. Tobacco abuse. We will go ahead and give him a nicotine patch. I counseled him on smoking cessation for about 10 minutes.  4. Ventral hernia. He is scheduled for surgical repair by Dr. Burt Knack and we will follow him postoperatively.   TIME SPENT ON CONSULTATION: 45 minutes.        ____________________________ Baxter Hire, MD jdj:vtd D: 04/12/2011 21:23:16 ET T: 04/13/2011 08:23:34 ET  Job entered as incorrect work type JOB#: [id number]  cc: Baxter Hire, MD, <Dictator> Baxter Hire MD ELECTRONICALLY SIGNED 04/15/2011 9:07

## 2014-07-17 NOTE — Consult Note (Signed)
Brief Consult Note: Diagnosis: 1. Preop, 2. COPD, 3. Tob Abuse, 4. Hernia.   Patient was seen by consultant.   Consult note dictated.   Recommend to proceed with surgery or procedure.   Orders entered.   Comments: Moderate risk because of COPD history. Currently controlled. Will boost up maintanace meds and add PRN.  Electronic Signatures: Harrel Lemon D (MD)  (Signed 18-Jan-13 21:18)  Authored: Brief Consult Note   Last Updated: 18-Jan-13 21:18 by Baxter Hire (MD)

## 2014-07-17 NOTE — H&P (Signed)
PATIENT NAME:  Brady Smith, Brady Smith MR#:  [id number] DATE OF BIRTH:  07-12-1945  DATE OF ADMISSION:  04/12/2011  CHIEF COMPLAINT: Abdominal pain.   HISTORY OF PRESENT ILLNESS: This is a patient with the acute onset of abdominal pain started approximately 12 hours ago. He has never had an episode like this before but he does state that he has had a ventral hernia which is essentially unchanged for 20 years. He had some sort of complex reconstructive procedure for bladder cancer involving bowel, kidney, ureter and bladder in [place] 20 years ago. He may have also had a ventral hernia repair subsequent to that or at the same time, possibly with mesh. It is not clear to me at this point based on the patient's difficult history.   He describes multiple emeses. He is vague in his questioning and answering but has apparently had multiple emeses today. He is passing flatus. Has not had a bowel movement in two days. When asked where his pain is he point from his sternal notch at his neck all the way to his pubis and to his anterior axillary lines, basically his whole abdomen and chest but when questioned he points to an incarcerated ventral hernia but states that that is unchanged from his 20 year appearance. He denies melena or hematochezia, no hematemesis. Denies fevers or chills.   PAST MEDICAL HISTORY: Patient has not seen a doctor in at least eight years. He apparently has coronary artery disease and has had a cardiac catheterization in the past by Dr. Humphrey Rolls but has not seen Dr. Humphrey Rolls in eight years. He has had bladder cancer but because he does not see a physician on a regular basis he is not taking any medicines at this time. Is not medicated for any problems. States that he also has bronchitis and certainly uses tobacco in excess.   PAST SURGICAL HISTORY: Complex repair for bladder cancer as above. No other surgery is noted. Possibly gallbladder removal and possible ventral hernia.   ALLERGIES: None.    MEDICATIONS: None.   FAMILY HISTORY: Noncontributory.   SOCIAL HISTORY: Patient states he smokes 2 packs of cigarettes per day. Does not drink much alcohol.   REVIEW OF SYSTEMS: Other than that mentioned in the history of present illness most of the review of systems is negative. A 10 system review is performed, however, the patient does have significant orthopnea and nocturnal dyspnea when lying flat suggestive of congestive heart failure.   PHYSICAL EXAMINATION:  GENERAL: Healthy-appearing patient, no acute distress.   VITAL SIGNS: Temperature 97.9, pulse 73, respirations 18, blood pressure 165/90, 96% room air sat.   HEENT: Poor dentition. No scleral icterus.   NECK: No palpable neck nodes.   CHEST: Bilateral rhonchi, bilateral rales.   CARDIAC: Regular rate and rhythm.   ABDOMEN: Soft, nondistended. There is a huge chronic-appearing incarcerated ventral hernia involving most of the periumbilical area measuring at least 30 cm in size. It is not completely reducible. It is minimally tender without peritoneal signs. No other masses. No other changes to his abdomen. He does have a long midline scar.   EXTREMITIES: Moderate edema.   NEUROLOGIC: Grossly intact.   INTEGUMENT: No jaundice.   LABORATORY, DIAGNOSTIC AND RADIOLOGICAL DATA: Laboratory values are within normal limits with the exception of a white blood cell count at 11.9, hemoglobin and hematocrit 16.8 and 50 with a platelet count of 247. CT scan is personally reviewed. It demonstrates a large widemouthed ventral hernia involving stomach and mostly small  bowel, possibly colonic and a small pleural effusion on the right.   ASSESSMENT AND PLAN: This is a patient with multiple medical problems that are not currently being treated. He clearly has tobacco abuse and coronary artery disease by history, probable chronic obstructive pulmonary disease and bronchitis. The patient does not have signs on the CT scan suggestive of  obstruction, but he is vomiting. He thinks he has food poisoning and just wants pain medicine. Initially refused hospitalization and refused nasogastric decompression. I left him with his family to discuss his options after discussing his options with him in detail. I received a call back stating that he was agreeable to nasogastric decompression and admission. I came back to the Emergency Room and saw him a second time and discussed plans for admission.  I would recommend NG decompression, hydration and re-examination as well as repeat KUB. I do not see signs of an acute process in his abdomen except that he is having abdominal pain at this time. There is no sign of exacerbation of his incarceration, certainly no signs of dead bowel. With that in mind plan would be as above.   With his multiple medical problems and probable congestive heart failure I have discussed with and asked internal medicine to see the patient in consultation for general care as well as potential for clearance if surgery is needed.  ____________________________ Jerrol Banana Burt Knack, MD rec:cms D: 04/12/2011 21:32:33 ET T: 04/13/2011 07:52:28 ET JOB#: [id number]  cc: Jerrol Banana. Burt Knack, MD, <Dictator> Florene Glen MD ELECTRONICALLY SIGNED 04/15/2011 6:51

## 2014-07-17 NOTE — H&P (Signed)
Subjective/Chief Complaint abd pain    History of Present Illness 12 hours of abd [pain, multiple emesis 20 years of inc VH that has not changed no BM in 2-3 days passing flatus no F/C    Past History PSH: colon bladder kidney surgery with complex reconstruction of unclear type 20 years ago in [place], [facility] repair with mesh PMH : CAD, Tob abuse, bronchitis No meds at this time, has not seen a doctor in 8 years +    Past Medical Health Coronary Artery Disease, Smoking   Past Med/Surgical Hx:  Raynaud's Disease:   Pancreatitis:   Cancer, Bladder:   Hypercholesterolemia:   Other, see comments: Pt reports Dr Humphrey Rolls told him he has a clogged artery.  Cholecystectomy:   Gall Bladder:   ALLERGIES:  NKA: None  Family and Social History:   Family History Negative    Social History positive  tobacco, negative ETOH, retired    + Tobacco Current (within 1 year)  2 packs per day    Place of Living Home   Review of Systems:   Subjective/Chief Complaint abd pain    Fever/Chills No    Cough Yes    Sputum No    Abdominal Pain Yes    Diarrhea No    Constipation Yes    Nausea/Vomiting Yes    SOB/DOE Yes    Chest Pain Yes    Dysuria No    Tolerating Diet No  Nauseated  Vomiting   Physical Exam:   GEN NAD, smells of smoke    HEENT pink conjunctivae    NECK supple    RESP normal resp effort  wheezing  rhonchi    CARD regular rate    ABD positive tenderness  positive hernia  soft  soft, inc VH min tenderness, no peritoneal signs    LYMPH negative neck    EXTR positive cyanosis/clubbing    SKIN normal to palpation    [facility] alert   Routine Chem:  18-Jan-13 15:00    Glucose, Serum 134   BUN 15   Creatinine (comp) 1.12   Sodium, Serum 138   Potassium, Serum 4.9   Chloride, Serum 102   CO2, Serum 25   Calcium (Total), Serum 9.8  Hepatic:  18-Jan-13 15:00    Bilirubin, Total 0.8   Alkaline Phosphatase 63   SGPT (ALT) 21   SGOT (AST) 15    Total Protein, Serum 8.0   Albumin, Serum 4.1  Routine Chem:  18-Jan-13 15:00    Osmolality (calc) 278   eGFR (African American) >60   eGFR (Non-African American) >60   Anion Gap 11  Routine Hem:  18-Jan-13 15:00    WBC (CBC) 11.9   RBC (CBC) 5.59   Hemoglobin (CBC) 16.8   Hematocrit (CBC) 50.0   Platelet Count (CBC) 247   MCV 89   MCH 30.1   MCHC 33.7   RDW 14.7   Neutrophil % 90.1   Lymphocyte % 6.5   Monocyte % 3.1   Eosinophil % 0.1   Basophil % 0.2   Neutrophil # 10.7   Lymphocyte # 0.8   Monocyte # 0.4   Eosinophil # 0.0   Basophil # 0.0  Routine Chem:  18-Jan-13 15:00    Lipase 86   Radiology Results: XRay:    04-Jul-12 08:24, Chest PA and Lateral   Chest PA and Lateral   REASON FOR EXAM:    cough, pneumonia, f/u cxr  COMMENTS:  PROCEDURE: DXR - DXR CHEST PA (OR AP) AND LATERAL  - Sep 26 2010  8:24AM     RESULT: Comparison is made to a prior study dated 09/24/2010.  Persistent   area of increased density projects in the right lower lobe and right   middle lobe. There is blunting of the costophrenic angle. No few focal   regions of consolidation are identified. The cardiac silhouette is within   normal limits. There is volume loss within the right lung base. The  visualized bony skeleton is unremarkable.     IMPRESSION:      Right lower and middle lobe infiltrates. Continued surveillance     evaluation is recommended. There also appears to be  findings consistent   with an concomitant small effusion.    Thank you for the opportunity to contribute to the care of your patient.           Verified By: Mikki Santee, M.D., MD    04-Jan-13 06:31, Chest PA and Lateral   Chest PA and Lateral   REASON FOR EXAM:    SOB  COMMENTS:       PROCEDURE: DXR - DXR CHEST PA (OR AP) AND LATERAL  - Mar 29 2011  6:31AM     RESULT: Comparison: None    Findings:     PA and lateral chest radiographs are provided. There is right perihilar   interstitial  hazy airspace disease concerning for pneumonia. The   remainder the lungs are clear. There is no pleural effusion or   pneumothorax. There is a small 7 mm left lower lobe pulmonary nodule. The   heart and mediastinum are unremarkable.  The osseous structures are   unremarkable.  IMPRESSION:     Right perihilar interstitial hazy airspace disease concerning for   pneumonia. Recommend followup radiography to document complete resolution   following adequate medical therapy. If there is not complete resolution,   then recommend further evaluation with CT of the chest to exclude   underlying pathology.    7 mm left lower lobe pulmonary nodule. Recommend followup radiography in   6 months to document stability.          Verified By: Jennette Banker, M.D., MD  CT:    18-Jan-13 18:08, CT Abdomen and Pelvis With Contrast   CT Abdomen and Pelvis With Contrast   REASON FOR EXAM:    (1) hernia, eval for obstruction; (2) hernia, eval   for obstruction  COMMENTS:       PROCEDURE: CT  - CT ABDOMEN / PELVIS  W  - Apr 12 2011  6:08PM     RESULT: History: Hernia. Evaluate for obstruction.    Comparison Study: PriorCT of 09/17/2007.    Findings: Standard CT performed with 100 cc of Isovue-[id number]. Small   right-sided pleural effusion is present. There is mild right base   atelectasis versus pneumonia. Liver normal. Surgical clips gallbladder   fossa. Spleen normal. Mild adrenal hyperplasia. By history the patient   has a neobladder.  No hydronephrosis. Mild ureteral prominence noted.     Noted in the bladder or neobladder is left bladder lateral wall   thickening. This could be tumor. Also noted is herniation ofthe anterior   stomach in the upper anterior abdominal wall in the midline. There is a   large supraumbilical hernia with herniation of small and large bowel.   There is associated small bowel distention. Mesenteric vessels and portal   vein  patent. No free air. Aorta measures 2.9 cm in  maximal diameter and   contains  chronic vascular changes. Renal vascular disease noted.    IMPRESSION:   1. Upper anterior abdominal wall new gastric hernia with herniation of a   small portion of the stomach. Noevidence of gastric obstruction.  2. Persistent supraumbilical large hernia with herniation of small and   large bowel. There is associated small bowel distention and/or   obstruction. The colon is nondistended.  3. Thickening of the left lateral bladder/neobladder wall ,tumor cannot     be excluded.  4. Small right pleural effusion with right base atelectasis and/or   pneumonia.          Verified By: Osa Craver, M.D., MD     Assessment/Admission Diagnosis Chronic inc VH, possibly recurrent. New abd pain. No peritoneal signs. Mult emesis but no signs of obstruction on CT. Will admit and repeat exam and KUB. Mult untreated med probs incl CAD and prob CHF, tob abuse. Has orthopnea and noct dyspnea Has not seen MD in 8 years. Will ask IM to see pt as well.   Electronic Signatures: Florene Glen (MD)  (Signed 18-Jan-13 21:25)  Authored: CHIEF COMPLAINT and HISTORY, PAST MEDICAL/SURGIAL HISTORY, ALLERGIES, FAMILY AND SOCIAL HISTORY, REVIEW OF SYSTEMS, PHYSICAL EXAM, LABS, Radiology, ASSESSMENT AND PLAN   Last Updated: 18-Jan-13 21:25 by Florene Glen (MD)

## 2014-08-29 ENCOUNTER — Encounter: Payer: Self-pay | Admitting: Internal Medicine

## 2014-08-30 ENCOUNTER — Ambulatory Visit (INDEPENDENT_AMBULATORY_CARE_PROVIDER_SITE_OTHER): Payer: Medicare PPO | Admitting: Internal Medicine

## 2014-08-30 ENCOUNTER — Encounter: Payer: Self-pay | Admitting: Internal Medicine

## 2014-08-30 VITALS — BP 120/70 | HR 72 | Ht 67.0 in | Wt 163.4 lb

## 2014-08-30 DIAGNOSIS — R1012 Left upper quadrant pain: Secondary | ICD-10-CM

## 2014-08-30 MED ORDER — OMEPRAZOLE 20 MG PO CPDR: 20.0000 mg | DELAYED_RELEASE_CAPSULE | Freq: Every day | ORAL | Status: DC

## 2014-08-30 NOTE — Patient Instructions (Signed)
Reduce intake of alcohol - limit to 1-2 beers per day Avoid aspirin, except for one 81 mg baby aspirin, avoid advil, aleve, Goody powders and BC powdersPeptic Ulcer A peptic ulcer is a sore in the lining of your esophagus (esophageal ulcer), stomach (gastric ulcer), or in the first part of your small intestine (duodenal ulcer). The ulcer causes erosion into the deeper tissue. CAUSES  Normally, the lining of the stomach and the small intestine protects itself from the acid that digests food. The protective lining can be damaged by:  An infection caused by a bacterium called Helicobacter pylori (H. pylori).  Regular use of nonsteroidal anti-inflammatory drugs (NSAIDs), such as ibuprofen or aspirin.  Smoking tobacco. Other risk factors include being older than 53, drinking alcohol excessively, and having a family history of ulcer disease.  SYMPTOMS   Burning pain or gnawing in the area between the chest and the belly button.  Heartburn.  Nausea and vomiting.  Bloating. The pain can be worse on an empty stomach and at night. If the ulcer results in bleeding, it can cause:  Black, tarry stools.  Vomiting of bright red blood.  Vomiting of coffee-ground-looking materials. DIAGNOSIS  A diagnosis is usually made based upon your history and an exam. Other tests and procedures may be performed to find the cause of the ulcer. Finding a cause will help determine the best treatment. Tests and procedures may include:  Blood tests, stool tests, or breath tests to check for the bacterium H. pylori.  An upper gastrointestinal (GI) series of the esophagus, stomach, and small intestine.  An endoscopy to examine the esophagus, stomach, and small intestine.  A biopsy. TREATMENT  Treatment may include:  Eliminating the cause of the ulcer, such as smoking, NSAIDs, or alcohol.  Medicines to reduce the amount of acid in your digestive tract.  Antibiotic medicines if the ulcer is caused by the H.  pylori bacterium.  An upper endoscopy to treat a bleeding ulcer.  Surgery if the bleeding is severe or if the ulcer created a hole somewhere in the digestive system. HOME CARE INSTRUCTIONS   Avoid tobacco, alcohol, and caffeine. Smoking can increase the acid in the stomach, and continued smoking will impair the healing of ulcers.  Avoid foods and drinks that seem to cause discomfort or aggravate your ulcer.  Only take medicines as directed by your caregiver. Do not substitute over-the-counter medicines for prescription medicines without talking to your caregiver.  Keep any follow-up appointments and tests as directed. SEEK MEDICAL CARE IF:   Your do not improve within 7 days of starting treatment.  You have ongoing indigestion or heartburn. SEEK IMMEDIATE MEDICAL CARE IF:   You have sudden, sharp, or persistent abdominal pain.  You have bloody or dark black, tarry stools.  You vomit blood or vomit that looks like coffee grounds.  You become light-headed, weak, or feel faint.  You become sweaty or clammy. MAKE SURE YOU:   Understand these instructions.  Will watch your condition.  Will get help right away if you are not doing well or get worse. Document Released: 03/08/2000 Document Revised: 07/26/2013 Document Reviewed: 10/09/2011 [facility] Patient Information 2015 [place], [place]. This information is not intended to replace advice given to you by your health care provider. Make sure you discuss any questions you have with your health care provider.

## 2014-08-30 NOTE — Progress Notes (Signed)
Date:  08/30/2014   Name:  Brady Smith   DOB:  06-Apr-1945   MRN:  [id number]   Chief Complaint: No chief complaint on file.   History of Present Illness:  This is a 69 y.o. male who is presenting with complaint of nausea.  It started three weeks ago after taking a pain medication that came from the ER for broken rib.  He took 9 pills over a couple of days.  He has had no vomiting.  No associated abdominal pain, no change in symptoms with food, no change in bowel habits.  He has hx of SBO but no hx of ulcer.  Review of Systems:  Review of Systems  Constitutional: Negative for fever, chills and appetite change (eating regular diet without problems).  Respiratory: Negative for cough and shortness of breath.   Gastrointestinal: Positive for nausea, abdominal pain and constipation (after drinking milk). Negative for vomiting, diarrhea and blood in stool.  Skin: Positive for rash.    Patient Active Problem List   Diagnosis Date Noted  . COPD (chronic obstructive pulmonary disease) 06/11/2011  . Smoker 06/11/2011  . Pulmonary nodule 06/11/2011    Prior to Admission medications   Medication Sig Start Date End Date Taking? Authorizing Provider  albuterol (VENTOLIN HFA) 108 (90 BASE) MCG/ACT inhaler Inhale 2 puffs into the lungs every 6 (six) hours as needed.   Yes Historical Provider, MD    No Known Allergies  Past Surgical History  Procedure Laterality Date  . Hernia repair    . Cholecystectomy    . Cystectomy w/ continent diversion  1996  . Prostatectomy  1996  . Tonsillectomy      History  Substance Use Topics  . Smoking status: Current Every Day Smoker -- 2.00 packs/day for 57 years    Types: Cigarettes  . Smokeless tobacco: Not on file     Comment: patient not ready to quit  . Alcohol Use: 8.4 oz/week    14 Standard drinks or equivalent per week     Medication list has been reviewed and updated.  Physical Examination:  Physical Exam  Constitutional: He appears  well-developed and well-nourished.  Neck: Normal range of motion. Neck supple.  Cardiovascular: Normal rate, regular rhythm and normal heart sounds.   Pulmonary/Chest: Effort normal and breath sounds normal. No respiratory distress. He has no wheezes.  Abdominal: Soft. Bowel sounds are normal. He exhibits distension. There is tenderness (mild to palpation; no guarding) in the right upper quadrant. There is no rebound. A hernia is present. Hernia confirmed positive in the ventral area (large soft ventra hernia).  Lymphadenopathy:    He has no cervical adenopathy.  Nursing note and vitals reviewed.   BP 120/70 mmHg  Pulse 72  Ht 5\' 7"  (1.702 m)  Wt 163 lb 6.4 oz (74.118 kg)  BMI 25.59 kg/m2  Assessment and Plan:  1. Left upper quadrant pain Suspect gastritis Pt cautioned against alcohol and nsaids - H. pylori antibody, IgG - CBC with Differential - omeprazole (PRILOSEC) 20 MG capsule; Take 1 capsule (20 mg total) by mouth daily.  Dispense: 30 capsule; Refill: [place], MD [facility]  08/30/2014

## 2014-08-31 ENCOUNTER — Other Ambulatory Visit: Payer: Self-pay | Admitting: Internal Medicine

## 2014-08-31 DIAGNOSIS — K2970 Gastritis, unspecified, without bleeding: Principal | ICD-10-CM

## 2014-08-31 DIAGNOSIS — B9681 Helicobacter pylori [H. pylori] as the cause of diseases classified elsewhere: Secondary | ICD-10-CM

## 2014-08-31 LAB — CBC WITH DIFFERENTIAL/PLATELET
Basophils Absolute: 0 x10E3/uL (ref 0.0–0.2)
Basos: 0 %
EOS (ABSOLUTE): 0.1 x10E3/uL (ref 0.0–0.4)
Eos: 2 %
Hematocrit: 45.5 % (ref 37.5–51.0)
Hemoglobin: 15.5 g/dL (ref 12.6–17.7)
Immature Grans (Abs): 0 x10E3/uL (ref 0.0–0.1)
Immature Granulocytes: 0 %
Lymphocytes Absolute: 1.6 x10E3/uL (ref 0.7–3.1)
Lymphs: 19 %
MCH: 32 pg (ref 26.6–33.0)
MCHC: 34.1 g/dL (ref 31.5–35.7)
MCV: 94 fL (ref 79–97)
Monocytes Absolute: 0.7 x10E3/uL (ref 0.1–0.9)
Monocytes: 8 %
Neutrophils Absolute: 6.3 x10E3/uL (ref 1.4–7.0)
Neutrophils: 71 %
Platelets: 176 x10E3/uL (ref 150–379)
RBC: 4.85 x10E6/uL (ref 4.14–5.80)
RDW: 13.4 % (ref 12.3–15.4)
WBC: 8.8 x10E3/uL (ref 3.4–10.8)

## 2014-08-31 LAB — H. PYLORI ANTIBODY, IGG: H Pylori IgG: >8 U/mL — ABNORMAL HIGH (ref 0.0–0.8)

## 2014-08-31 MED ORDER — CLARITHROMYCIN 500 MG PO TABS: 500.0000 mg | ORAL_TABLET | Freq: Two times a day (BID) | ORAL | Status: DC

## 2014-08-31 MED ORDER — AMOXICILLIN 500 MG PO TABS: 1000.0000 mg | ORAL_TABLET | Freq: Two times a day (BID) | ORAL | Status: DC

## 2015-01-12 DIAGNOSIS — D3131 Benign neoplasm of right choroid: Secondary | ICD-10-CM | POA: Diagnosis not present

## 2015-01-13 DIAGNOSIS — E7849 Other hyperlipidemia: Secondary | ICD-10-CM | POA: Insufficient documentation

## 2015-01-13 DIAGNOSIS — E785 Hyperlipidemia, unspecified: Secondary | ICD-10-CM | POA: Insufficient documentation

## 2015-01-13 DIAGNOSIS — G5600 Carpal tunnel syndrome, unspecified upper limb: Secondary | ICD-10-CM | POA: Insufficient documentation

## 2015-01-13 DIAGNOSIS — F17200 Nicotine dependence, unspecified, uncomplicated: Secondary | ICD-10-CM | POA: Insufficient documentation

## 2015-01-13 DIAGNOSIS — F409 Phobic anxiety disorder, unspecified: Secondary | ICD-10-CM | POA: Insufficient documentation

## 2015-01-13 DIAGNOSIS — Z8603 Personal history of neoplasm of uncertain behavior: Secondary | ICD-10-CM | POA: Insufficient documentation

## 2015-01-13 DIAGNOSIS — Z87898 Personal history of other specified conditions: Secondary | ICD-10-CM | POA: Insufficient documentation

## 2015-01-13 DIAGNOSIS — Z8551 Personal history of malignant neoplasm of bladder: Secondary | ICD-10-CM | POA: Insufficient documentation

## 2015-01-13 DIAGNOSIS — Z8719 Personal history of other diseases of the digestive system: Secondary | ICD-10-CM | POA: Insufficient documentation

## 2015-01-16 ENCOUNTER — Telehealth: Payer: Self-pay

## 2015-01-16 ENCOUNTER — Other Ambulatory Visit: Payer: Self-pay | Admitting: Internal Medicine

## 2015-01-16 ENCOUNTER — Encounter: Payer: Self-pay | Admitting: Internal Medicine

## 2015-01-16 DIAGNOSIS — Z1211 Encounter for screening for malignant neoplasm of colon: Secondary | ICD-10-CM

## 2015-01-16 NOTE — Telephone Encounter (Signed)
Patient states went to eye doctor at [facility] and they told him he needed to call you for referral for colonoscopy. dr

## 2015-01-19 ENCOUNTER — Encounter: Payer: Self-pay | Admitting: Gastroenterology

## 2015-01-24 NOTE — Telephone Encounter (Signed)
error 

## 2015-02-03 ENCOUNTER — Telehealth: Payer: Self-pay | Admitting: Gastroenterology

## 2015-02-03 NOTE — Telephone Encounter (Signed)
Patient has an office appt 11/29

## 2015-02-03 NOTE — Telephone Encounter (Signed)
colonoscopy

## 2015-02-08 ENCOUNTER — Ambulatory Visit (INDEPENDENT_AMBULATORY_CARE_PROVIDER_SITE_OTHER): Payer: Medicare PPO | Admitting: Internal Medicine

## 2015-02-08 ENCOUNTER — Encounter: Payer: Self-pay | Admitting: Internal Medicine

## 2015-02-08 VITALS — BP 120/70 | HR 72 | Ht 67.0 in | Wt 166.8 lb

## 2015-02-08 DIAGNOSIS — J449 Chronic obstructive pulmonary disease, unspecified: Secondary | ICD-10-CM | POA: Diagnosis not present

## 2015-02-08 DIAGNOSIS — C678 Malignant neoplasm of overlapping sites of bladder: Secondary | ICD-10-CM

## 2015-02-08 DIAGNOSIS — D3131 Benign neoplasm of right choroid: Secondary | ICD-10-CM | POA: Diagnosis not present

## 2015-02-08 DIAGNOSIS — D3130 Benign neoplasm of unspecified choroid: Secondary | ICD-10-CM | POA: Insufficient documentation

## 2015-02-08 NOTE — Progress Notes (Signed)
Date:  02/08/2015   Name:  Brady Smith   DOB:  [birth year]   MRN:  [id number]   Chief Complaint: No chief complaint on file.  Patient is here to discuss findings from his ophthalmology visit. The ophthalmologist noted a choroidal nevus which he states indicates high risk of colon cancer. It appears that his last colonoscopy was in 2000 and was normal. He denies any change in his bowel habits or blood in his stool. He has an appointment in 2 weeks for a GI consultation. COPD - patient reports doing well with respect to his breathing. He denies productive cough, wheezing or change in shortness of breath. He has a rescue inhaler that he uses intermittently. He had stopped using it when a friend told him it might make his eye disease worse. On review of the ophthalmology note, he has early cataracts, the choroidal nevus but no other abnormality. History of bladder cancer - he is status post radical prostatectomy and urinary bladder resection. He had an ileostomy diverted to the urethra in 1996. He reports no current urinary issues with bleeding, odor, or dysuria.   Review of Systems  Constitutional: Negative for fever and unexpected weight change.  HENT: Negative for hearing loss.   Eyes: Negative for visual disturbance.  Respiratory: Negative for cough and chest tightness.   Cardiovascular: Positive for chest pain.  Gastrointestinal: Positive for abdominal distention. Negative for abdominal pain, diarrhea, constipation and blood in stool.  Genitourinary: Negative for dysuria, hematuria, difficulty urinating and penile pain.  Psychiatric/Behavioral: Negative for dysphoric mood. The patient is not nervous/anxious.     Patient Active Problem List   Diagnosis Date Noted  . Choroidal nevus, right eye 02/08/2015  . Carpal tunnel syndrome 01/13/2015  . H/O gastrointestinal disease 01/13/2015  . Phobia 01/13/2015  . Malignant neoplasm of overlapping sites of bladder ([place]) 01/13/2015  . Compulsive  tobacco user syndrome 01/13/2015  . Familial multiple lipoprotein-type hyperlipidemia 01/13/2015  . COPD (chronic obstructive pulmonary disease) ([place]) 06/11/2011  . Smoker 06/11/2011  . Pulmonary nodule 06/11/2011    Prior to Admission medications   Medication Sig Start Date End Date Taking? Authorizing Provider  albuterol (VENTOLIN HFA) 108 (90 BASE) MCG/ACT inhaler Inhale 2 puffs into the lungs every 6 (six) hours as needed.   Yes Historical Provider, MD  omeprazole (PRILOSEC) 20 MG capsule Take 1 capsule (20 mg total) by mouth daily. Patient not taking: Reported on 02/08/2015 08/30/14   Glean Hess, MD    No Known Allergies  Past Surgical History  Procedure Laterality Date  . Hernia repair    . Cholecystectomy    . Cystectomy w/ continent diversion  1996  . Prostatectomy  1996  . Tonsillectomy    . Colonoscopy  2000    Social History  Substance Use Topics  . Smoking status: Current Every Day Smoker -- 2.00 packs/day for 57 years    Types: Cigarettes  . Smokeless tobacco: None     Comment: patient not ready to quit  . Alcohol Use: 8.4 oz/week    14 Standard drinks or equivalent per week    Medication list has been reviewed and updated.   Physical Exam  Constitutional: He appears well-developed and well-nourished.  Neck: Normal range of motion. Neck supple.  Cardiovascular: Normal rate, regular rhythm and normal heart sounds.   Pulmonary/Chest: Effort normal. He has decreased breath sounds. He has no wheezes. He has no rhonchi.  Abdominal: Soft. Bowel sounds are normal. He exhibits distension.  Lymphadenopathy:    He has no cervical adenopathy.  Nursing note and vitals reviewed.   BP 120/70 mmHg  Pulse 72  Ht 5\' 7"  (1.702 m)  Wt 166 lb 12.8 oz (75.66 kg)  BMI 26.12 kg/m2  Assessment and Plan: 1. Choroidal nevus, right eye GI consultation for possible colonoscopy I recommend he keep the consultation appointment as his procedure might be more complicated  from his previous abdominal surgeries  2. Chronic obstructive pulmonary disease, unspecified COPD type ([place]) Stable; may resume use of albuterol as needed  3. Malignant neoplasm of overlapping sites of bladder [facility]) Doing well status post surgery in [place], MD [facility]  02/08/2015

## 2015-02-21 ENCOUNTER — Ambulatory Visit: Payer: Medicare PPO | Admitting: Gastroenterology

## 2015-03-10 ENCOUNTER — Encounter: Payer: Self-pay | Admitting: Internal Medicine

## 2015-05-17 ENCOUNTER — Inpatient Hospital Stay
Admission: EM | Admit: 2015-05-17 | Discharge: 2015-06-08 | DRG: 329 | Disposition: A | Discharge status: Skilled Nursing Facility | Payer: Medicare PPO | Attending: Internal Medicine | Admitting: Internal Medicine

## 2015-05-17 ENCOUNTER — Emergency Department: Payer: Medicare PPO

## 2015-05-17 DIAGNOSIS — K660 Peritoneal adhesions (postprocedural) (postinfection): Secondary | ICD-10-CM | POA: Diagnosis present

## 2015-05-17 DIAGNOSIS — Z906 Acquired absence of other parts of urinary tract: Secondary | ICD-10-CM

## 2015-05-17 DIAGNOSIS — M6281 Muscle weakness (generalized): Secondary | ICD-10-CM | POA: Diagnosis not present

## 2015-05-17 DIAGNOSIS — I4891 Unspecified atrial fibrillation: Secondary | ICD-10-CM | POA: Diagnosis present

## 2015-05-17 DIAGNOSIS — J690 Pneumonitis due to inhalation of food and vomit: Secondary | ICD-10-CM | POA: Diagnosis not present

## 2015-05-17 DIAGNOSIS — J441 Chronic obstructive pulmonary disease with (acute) exacerbation: Secondary | ICD-10-CM | POA: Diagnosis not present

## 2015-05-17 DIAGNOSIS — J158 Pneumonia due to other specified bacteria: Secondary | ICD-10-CM | POA: Diagnosis not present

## 2015-05-17 DIAGNOSIS — R739 Hyperglycemia, unspecified: Secondary | ICD-10-CM | POA: Diagnosis not present

## 2015-05-17 DIAGNOSIS — R502 Drug induced fever: Secondary | ICD-10-CM | POA: Diagnosis not present

## 2015-05-17 DIAGNOSIS — F1010 Alcohol abuse, uncomplicated: Secondary | ICD-10-CM | POA: Diagnosis not present

## 2015-05-17 DIAGNOSIS — J9690 Respiratory failure, unspecified, unspecified whether with hypoxia or hypercapnia: Secondary | ICD-10-CM | POA: Diagnosis not present

## 2015-05-17 DIAGNOSIS — F1014 Alcohol abuse with alcohol-induced mood disorder: Secondary | ICD-10-CM | POA: Diagnosis not present

## 2015-05-17 DIAGNOSIS — K450 Other specified abdominal hernia with obstruction, without gangrene: Secondary | ICD-10-CM | POA: Diagnosis present

## 2015-05-17 DIAGNOSIS — T361X5A Adverse effect of cephalosporins and other beta-lactam antibiotics, initial encounter: Secondary | ICD-10-CM | POA: Diagnosis not present

## 2015-05-17 DIAGNOSIS — E860 Dehydration: Secondary | ICD-10-CM | POA: Diagnosis present

## 2015-05-17 DIAGNOSIS — K469 Unspecified abdominal hernia without obstruction or gangrene: Secondary | ICD-10-CM | POA: Diagnosis not present

## 2015-05-17 DIAGNOSIS — R0602 Shortness of breath: Secondary | ICD-10-CM | POA: Diagnosis not present

## 2015-05-17 DIAGNOSIS — J9621 Acute and chronic respiratory failure with hypoxia: Secondary | ICD-10-CM | POA: Diagnosis not present

## 2015-05-17 DIAGNOSIS — Y92230 Patient room in hospital as the place of occurrence of the external cause: Secondary | ICD-10-CM | POA: Diagnosis not present

## 2015-05-17 DIAGNOSIS — D696 Thrombocytopenia, unspecified: Secondary | ICD-10-CM | POA: Diagnosis present

## 2015-05-17 DIAGNOSIS — F17210 Nicotine dependence, cigarettes, uncomplicated: Secondary | ICD-10-CM | POA: Diagnosis present

## 2015-05-17 DIAGNOSIS — G928 Other toxic encephalopathy: Secondary | ICD-10-CM

## 2015-05-17 DIAGNOSIS — R339 Retention of urine, unspecified: Secondary | ICD-10-CM | POA: Diagnosis not present

## 2015-05-17 DIAGNOSIS — G9340 Encephalopathy, unspecified: Secondary | ICD-10-CM

## 2015-05-17 DIAGNOSIS — I10 Essential (primary) hypertension: Secondary | ICD-10-CM | POA: Diagnosis present

## 2015-05-17 DIAGNOSIS — J449 Chronic obstructive pulmonary disease, unspecified: Secondary | ICD-10-CM | POA: Diagnosis not present

## 2015-05-17 DIAGNOSIS — R579 Shock, unspecified: Secondary | ICD-10-CM | POA: Diagnosis not present

## 2015-05-17 DIAGNOSIS — J9600 Acute respiratory failure, unspecified whether with hypoxia or hypercapnia: Secondary | ICD-10-CM

## 2015-05-17 DIAGNOSIS — F10231 Alcohol dependence with withdrawal delirium: Secondary | ICD-10-CM | POA: Diagnosis not present

## 2015-05-17 DIAGNOSIS — J9811 Atelectasis: Secondary | ICD-10-CM | POA: Diagnosis not present

## 2015-05-17 DIAGNOSIS — I739 Peripheral vascular disease, unspecified: Secondary | ICD-10-CM | POA: Diagnosis present

## 2015-05-17 DIAGNOSIS — E870 Hyperosmolality and hypernatremia: Secondary | ICD-10-CM | POA: Diagnosis present

## 2015-05-17 DIAGNOSIS — Z85528 Personal history of other malignant neoplasm of kidney: Secondary | ICD-10-CM

## 2015-05-17 DIAGNOSIS — Z7982 Long term (current) use of aspirin: Secondary | ICD-10-CM | POA: Diagnosis not present

## 2015-05-17 DIAGNOSIS — R059 Cough, unspecified: Secondary | ICD-10-CM

## 2015-05-17 DIAGNOSIS — K56609 Unspecified intestinal obstruction, unspecified as to partial versus complete obstruction: Secondary | ICD-10-CM

## 2015-05-17 DIAGNOSIS — R509 Fever, unspecified: Secondary | ICD-10-CM | POA: Diagnosis not present

## 2015-05-17 DIAGNOSIS — Z8551 Personal history of malignant neoplasm of bladder: Secondary | ICD-10-CM

## 2015-05-17 DIAGNOSIS — K5660 Unspecified intestinal obstruction: Secondary | ICD-10-CM | POA: Diagnosis not present

## 2015-05-17 DIAGNOSIS — R0989 Other specified symptoms and signs involving the circulatory and respiratory systems: Secondary | ICD-10-CM | POA: Diagnosis not present

## 2015-05-17 DIAGNOSIS — R1312 Dysphagia, oropharyngeal phase: Secondary | ICD-10-CM | POA: Diagnosis not present

## 2015-05-17 DIAGNOSIS — R471 Dysarthria and anarthria: Secondary | ICD-10-CM | POA: Diagnosis not present

## 2015-05-17 DIAGNOSIS — R079 Chest pain, unspecified: Secondary | ICD-10-CM | POA: Diagnosis not present

## 2015-05-17 DIAGNOSIS — R05 Cough: Secondary | ICD-10-CM | POA: Diagnosis not present

## 2015-05-17 DIAGNOSIS — K436 Other and unspecified ventral hernia with obstruction, without gangrene: Secondary | ICD-10-CM

## 2015-05-17 DIAGNOSIS — K55069 Acute infarction of intestine, part and extent unspecified: Secondary | ICD-10-CM | POA: Diagnosis not present

## 2015-05-17 DIAGNOSIS — R140 Abdominal distension (gaseous): Secondary | ICD-10-CM | POA: Diagnosis not present

## 2015-05-17 DIAGNOSIS — R4701 Aphasia: Secondary | ICD-10-CM

## 2015-05-17 DIAGNOSIS — R1084 Generalized abdominal pain: Secondary | ICD-10-CM | POA: Diagnosis not present

## 2015-05-17 DIAGNOSIS — Z8249 Family history of ischemic heart disease and other diseases of the circulatory system: Secondary | ICD-10-CM

## 2015-05-17 DIAGNOSIS — Z452 Encounter for adjustment and management of vascular access device: Secondary | ICD-10-CM | POA: Diagnosis not present

## 2015-05-17 DIAGNOSIS — R112 Nausea with vomiting, unspecified: Secondary | ICD-10-CM

## 2015-05-17 DIAGNOSIS — J189 Pneumonia, unspecified organism: Secondary | ICD-10-CM

## 2015-05-17 DIAGNOSIS — Z743 Need for continuous supervision: Secondary | ICD-10-CM | POA: Diagnosis not present

## 2015-05-17 DIAGNOSIS — K449 Diaphragmatic hernia without obstruction or gangrene: Secondary | ICD-10-CM | POA: Diagnosis not present

## 2015-05-17 DIAGNOSIS — I9581 Postprocedural hypotension: Secondary | ICD-10-CM | POA: Diagnosis not present

## 2015-05-17 DIAGNOSIS — K913 Postprocedural intestinal obstruction: Secondary | ICD-10-CM | POA: Diagnosis not present

## 2015-05-17 DIAGNOSIS — J440 Chronic obstructive pulmonary disease with acute lower respiratory infection: Secondary | ICD-10-CM | POA: Diagnosis present

## 2015-05-17 DIAGNOSIS — K460 Unspecified abdominal hernia with obstruction, without gangrene: Secondary | ICD-10-CM | POA: Diagnosis not present

## 2015-05-17 DIAGNOSIS — Z95828 Presence of other vascular implants and grafts: Secondary | ICD-10-CM

## 2015-05-17 DIAGNOSIS — G9341 Metabolic encephalopathy: Secondary | ICD-10-CM | POA: Diagnosis not present

## 2015-05-17 DIAGNOSIS — K55029 Acute infarction of small intestine, extent unspecified: Secondary | ICD-10-CM | POA: Diagnosis present

## 2015-05-17 DIAGNOSIS — K5669 Other intestinal obstruction: Secondary | ICD-10-CM | POA: Diagnosis not present

## 2015-05-17 DIAGNOSIS — Z825 Family history of asthma and other chronic lower respiratory diseases: Secondary | ICD-10-CM | POA: Diagnosis not present

## 2015-05-17 DIAGNOSIS — E876 Hypokalemia: Secondary | ICD-10-CM | POA: Diagnosis present

## 2015-05-17 DIAGNOSIS — Z01818 Encounter for other preprocedural examination: Secondary | ICD-10-CM

## 2015-05-17 DIAGNOSIS — C679 Malignant neoplasm of bladder, unspecified: Secondary | ICD-10-CM | POA: Diagnosis not present

## 2015-05-17 DIAGNOSIS — R4182 Altered mental status, unspecified: Secondary | ICD-10-CM | POA: Diagnosis not present

## 2015-05-17 DIAGNOSIS — Z9889 Other specified postprocedural states: Secondary | ICD-10-CM | POA: Diagnosis not present

## 2015-05-17 DIAGNOSIS — Z978 Presence of other specified devices: Secondary | ICD-10-CM

## 2015-05-17 DIAGNOSIS — J9601 Acute respiratory failure with hypoxia: Secondary | ICD-10-CM | POA: Diagnosis not present

## 2015-05-17 DIAGNOSIS — N390 Urinary tract infection, site not specified: Secondary | ICD-10-CM

## 2015-05-17 DIAGNOSIS — I714 Abdominal aortic aneurysm, without rupture: Secondary | ICD-10-CM | POA: Diagnosis present

## 2015-05-17 DIAGNOSIS — K439 Ventral hernia without obstruction or gangrene: Secondary | ICD-10-CM | POA: Diagnosis not present

## 2015-05-17 LAB — COMPREHENSIVE METABOLIC PANEL
ALBUMIN: 4.1 g/dL (ref 3.5–5.0)
ALT: 16 U/L — AB (ref 17–63)
ANION GAP: 9 (ref 5–15)
AST: 19 U/L (ref 15–41)
Alkaline Phosphatase: 58 U/L (ref 38–126)
BUN: 11 mg/dL (ref 6–20)
CHLORIDE: 104 mmol/L (ref 101–111)
CO2: 27 mmol/L (ref 22–32)
Calcium: 9.3 mg/dL (ref 8.9–10.3)
Creatinine, Ser: 0.86 mg/dL (ref 0.61–1.24)
GFR calc non Af Amer: >60 mL/min (ref >60)
GLUCOSE: 125 mg/dL — AB (ref 65–99)
POTASSIUM: 4.1 mmol/L (ref 3.5–5.1)
Sodium: 140 mmol/L (ref 135–145)
Total Bilirubin: 1.1 mg/dL (ref 0.3–1.2)
Total Protein: 7.5 g/dL (ref 6.5–8.1)

## 2015-05-17 LAB — CBC
HCT: 55.2 % — ABNORMAL HIGH (ref 40.0–52.0)
HEMOGLOBIN: 18.4 g/dL — AB (ref 13.0–18.0)
MCH: 31.7 pg (ref 26.0–34.0)
MCHC: 33.3 g/dL (ref 32.0–36.0)
MCV: 95.1 fL (ref 80.0–100.0)
Platelets: 185 10*3/uL (ref 150–440)
RBC: 5.8 MIL/uL (ref 4.40–5.90)
RDW: 13.4 % (ref 11.5–14.5)
WBC: 8.9 10*3/uL (ref 3.8–10.6)

## 2015-05-17 LAB — APTT: aPTT: 29 seconds (ref 24–36)

## 2015-05-17 LAB — URINALYSIS COMPLETE WITH MICROSCOPIC (ARMC ONLY)
BACTERIA UA: NONE SEEN
Bilirubin Urine: NEGATIVE
Glucose, UA: NEGATIVE mg/dL
LEUKOCYTES UA: NEGATIVE
NITRITE: NEGATIVE
PROTEIN: 30 mg/dL — AB
Specific Gravity, Urine: 1.01 (ref 1.005–1.030)
Squamous Epithelial / LPF: NONE SEEN
pH: 7 (ref 5.0–8.0)

## 2015-05-17 LAB — TYPE AND SCREEN
ABO/RH(D): O POS
Antibody Screen: NEGATIVE

## 2015-05-17 LAB — PROTIME-INR
INR: 1.01
Prothrombin Time: 13.5 seconds (ref 11.4–15.0)

## 2015-05-17 MED ORDER — IOHEXOL 300 MG/ML  SOLN
100.0000 mL | Freq: Once | INTRAMUSCULAR | Status: AC | PRN
  Administered 2015-05-17: 100 mL via INTRAVENOUS

## 2015-05-17 MED ORDER — ONDANSETRON HCL 4 MG/2ML IJ SOLN
4.0000 mg | Freq: Once | INTRAMUSCULAR | Status: AC
  Administered 2015-05-17: 4 mg via INTRAVENOUS
  Filled 2015-05-17: qty 2

## 2015-05-17 MED ORDER — IOHEXOL 240 MG/ML SOLN
25.0000 mL | Freq: Once | INTRAMUSCULAR | Status: AC | PRN
  Administered 2015-05-17: 25 mL via ORAL

## 2015-05-17 MED ORDER — HYDROMORPHONE HCL 1 MG/ML IJ SOLN
0.5000 mg | Freq: Once | INTRAMUSCULAR | Status: AC
  Administered 2015-05-17: 0.5 mg via INTRAVENOUS
  Filled 2015-05-17: qty 1

## 2015-05-17 MED ORDER — SODIUM CHLORIDE 0.9 % IV BOLUS (SEPSIS)
1000.0000 mL | Freq: Once | INTRAVENOUS | Status: AC
Start: 2015-05-17 — End: 2015-05-17
  Administered 2015-05-17: 1000 mL via INTRAVENOUS

## 2015-05-17 NOTE — ED Provider Notes (Signed)
[facility] Emergency Department Provider Note  ____________________________________________  Time seen: Approximately 9:26 PM  I have reviewed the triage vital signs and the nursing notes.   HISTORY  Chief Complaint Chest Pain and Abdominal Pain  The patient history is limited due to the patient being unable to answer basic questions about his past medical history.  HPI Brady Smith is a 70 y.o. male male with a history of bladder CA status post cystectomy with diversion, hernia repairpresenting with abdominal pain and "bumps."  Last night, the patient ate 2 cans of corn and afterward states that he developed an increasing amount of swelling in his midline abdominal hernia with a new "bump". He has associated pain, nausea and vomiting. No diarrhea, or constipation. No fever or chills.   Past Medical History  Diagnosis Date  . History of kidney cancer   . Bladder cancer ([place])   . Pneumonia     Patient Active Problem List   Diagnosis Date Noted  . Choroidal nevus, right eye 02/08/2015  . Carpal tunnel syndrome 01/13/2015  . H/O gastrointestinal disease 01/13/2015  . Phobia 01/13/2015  . History of primary malignant neoplasm of urinary bladder 01/13/2015  . Compulsive tobacco user syndrome 01/13/2015  . Familial multiple lipoprotein-type hyperlipidemia 01/13/2015  . COPD (chronic obstructive pulmonary disease) ([place]) 06/11/2011  . Smoker 06/11/2011  . Pulmonary nodule 06/11/2011    Past Surgical History  Procedure Laterality Date  . Hernia repair    . Cholecystectomy    . Cystectomy w/ continent diversion  1996  . Prostatectomy  1996  . Tonsillectomy    . Colonoscopy  2000    Current Outpatient Rx  Name  Route  Sig  Dispense  Refill  . albuterol (VENTOLIN HFA) 108 (90 BASE) MCG/ACT inhaler   Inhalation   Inhale 2 puffs into the lungs every 6 (six) hours as needed.         Marland Kitchen omeprazole (PRILOSEC) 20 MG capsule   Oral   Take 1 capsule  (20 mg total) by mouth daily. Patient not taking: Reported on 02/08/2015   30 capsule   3     Allergies Review of patient's allergies indicates no known allergies.  Family History  Problem Relation Age of Onset  . COPD Sister   . Heart disease Mother   . Heart disease Father     Social History Social History  Substance Use Topics  . Smoking status: Current Every Day Smoker -- 2.00 packs/day for 57 years    Types: Cigarettes  . Smokeless tobacco: None     Comment: patient not ready to quit  . Alcohol Use: 8.4 oz/week    14 Standard drinks or equivalent per week    Review of Systems Constitutional: No fever/chills. No lightheadedness or syncope. Eyes: No visual changes. ENT: No sore throat. Cardiovascular: Denies chest pain, palpitations. Respiratory: Denies shortness of breath.  No cough. Gastrointestinal: Positive abdominal pain.  Positive abdominal mass. Positive nausea, positive vomiting.  No diarrhea.  No constipation. Genitourinary: Negative for dysuria. Musculoskeletal: Negative for back pain. Skin: Negative for rash. Neurological: Negative for headaches, focal weakness or numbness.  10-point ROS otherwise negative.  ____________________________________________   PHYSICAL EXAM:  VITAL SIGNS: ED Triage Vitals  Enc Vitals Group     BP 05/17/15 2046 152/86 mmHg     Pulse Rate 05/17/15 2046 71     Resp 05/17/15 2046 18     Temp 05/17/15 2046 98.5 F (36.9 C)  Temp Source 05/17/15 2046 Oral     SpO2 05/17/15 2046 98 %     Weight 05/17/15 2046 170 lb (77.111 kg)     Height 05/17/15 2046 5\' 8"  (1.727 m)     Head Cir --      Peak Flow --      Pain Score 05/17/15 2058 10     Pain Loc --      Pain Edu? --      Excl. in [place]? --     Constitutional: Patient is alert and answering some questions appropriately. He is chronically ill appearing and uncomfortable but nontoxic.  Eyes: Conjunctivae are normal.  EOMI. no scleral icterus. Head: Atraumatic. Nose:  No congestion/rhinnorhea. Mouth/Throat: Mucous membranes are dry.  Neck: No stridor.  Supple.   Cardiovascular: Normal rate, regular rhythm. No murmurs, rubs or gallops.  Respiratory: Normal respiratory effort.  No retractions. Lungs CTAB.  No wheezes, rales or ronchi. Gastrointestinal: Abdomen is soft. The patient has a large multiloculated mass that extends throughout the entire midline and is the size of a small basketball. I do not hear bowel sounds over the mass. He has significant tenderness to palpation but no rebound, guarding or peritoneal signs. He does have an old well-healed Surgical incision. Musculoskeletal: No LE edema.  Neurologic:  Normal speech and language. No gross focal neurologic deficits are appreciated.  Skin:  Skin is warm, dry and intact. No rash noted. Psychiatric: Mood and affect are normal. Speech and behavior are normal.  Normal judgement.  ____________________________________________   LABS (all labs ordered are listed, but only abnormal results are displayed)  Labs Reviewed  CBC - Abnormal; Notable for the following:    Hemoglobin 18.4 (*)    HCT 55.2 (*)    All other components within normal limits  COMPREHENSIVE METABOLIC PANEL - Abnormal; Notable for the following:    Glucose, Bld 125 (*)    ALT 16 (*)    All other components within normal limits  URINALYSIS COMPLETEWITH MICROSCOPIC ([facility] ONLY) - Abnormal; Notable for the following:    Color, Urine YELLOW (*)    APPearance CLEAR (*)    Ketones, ur TRACE (*)    Hgb urine dipstick 1+ (*)    Protein, ur 30 (*)    All other components within normal limits  APTT  PROTIME-INR  TYPE AND SCREEN  ABO/RH   ____________________________________________  EKG  Not indicated ____________________________________________  RADIOLOGY  Ct Abdomen Pelvis W Contrast  05/17/2015  CLINICAL DATA:  Generalized abdominal and chest pain since this afternoon. History bladder cancer. EXAM: CT ABDOMEN AND PELVIS  WITH CONTRAST TECHNIQUE: Multidetector CT imaging of the abdomen and pelvis was performed using the standard protocol following bolus administration of intravenous contrast. CONTRAST:  159mL OMNIPAQUE IOHEXOL 300 MG/ML  SOLN COMPARISON:  07/27/2013.  04/12/2011 FINDINGS: Lower chest: Stable appearance of chronic right pleural thickening posteriorly. Hepatobiliary: No focal abnormality within the liver parenchyma. Gallbladder surgically absent. No intrahepatic or extrahepatic biliary dilation. Pancreas: No focal mass lesion. No dilatation of the main duct. No intraparenchymal cyst. No peripancreatic edema. Spleen: No splenomegaly. No focal mass lesion. Adrenals/Urinary Tract: Stable tiny bilateral adrenal nodules consistent with adenomas. Cortical scarring noted in the right kidney. No enhancing lesion in either kidney. Stable small right renal cysts. Left kidney is unremarkable. Mild right hydronephrosis is associated with right hydroureter with the right ureter reimplanted along the left anterior bladder wall. On previous exams, at this reimplanted ureter, potentially a conduit, had thin walls  but demonstrates a thick enhancing wall on today's study. There is some mild wall thickening in the right aspect of the neobladder. Left ureter also appears to tiny into the conduit. Stomach/Bowel: Stomach is mildly distended. Duodenum is normally positioned as is the ligament of Treitz. Multiple small bowel loops extend through a complex ventral hernia that also contains transverse colon. There is a dilated small bowel loop measuring up to 3.8 cm that tracks into the hernia with decompressed, non-opacified small bowel exiting the hernia sac. The terminal ileum is normal. The appendix is not visualized, but there is no edema or inflammation in the region of the cecum. Transverse colon extends into the hernia sac without proximal colonic dilatation. Diverticular change noted in the left colon without diverticulitis.  Vascular/Lymphatic: Abdominal aorta measures up to 3.0 cm and diameter, consistent with aneurysm. Atherosclerotic calcification is associated in the abdominal aortic wall. There is no gastrohepatic or hepatoduodenal ligament lymphadenopathy. No intraperitoneal or retroperitoneal lymphadenopathy. No pelvic sidewall lymphadenopathy. Reproductive: Prostate gland appears to be surgically absent. Other: No intraperitoneal free fluid. Musculoskeletal: Bone windows reveal no worrisome lytic or sclerotic osseous lesions. Bilateral pars interarticularis defects are seen at L5. IMPRESSION: Large complex ventral hernia as seen on multiple previous imaging studies. Small bowel and colon is contained within the hernia sac and on today's study, and there is a distended contrast filled small bowel loop that appears to track into the hernia where multiple dilated small bowel loops are present. Small bowel leaving the hernia sac is decompressed and non-opacified. As such, imaging features are suspicious for incarcerated small bowel with obstruction related to the ventral hernia. Patient appears to be status post cystectomy with urinary diversion to a neobladder. There is some wall thickening in the urinary conduit as it ties into the neobladder, but this is nonspecific. On previous studies, the wall to this structure was very thin and the wall thickening today makes inflammation a concern. There is also some new wall thickening associated with the right aspect of the neobladder. Stable tiny bilateral adrenal nodules, likely representing adenomas. Abdominal aortic atherosclerosis with 3.0 cm aneurysm, stable. Electronically Signed   By: Misty Stanley M.D.   On: 05/17/2015 23:32    ____________________________________________   PROCEDURES  Procedure(s) performed: None  Critical Care performed: No ____________________________________________   INITIAL IMPRESSION / ASSESSMENT AND PLAN / ED COURSE  Pertinent labs &  imaging results that were available during my care of the patient were reviewed by me and considered in my medical decision making (see chart for details).  70 y.o. male with a history of multiple abdominal surgeries presenting with large midline abdominal mass and pain. The patient likely has had a previous abdominal wall hernia, which may be worsened today. I am concern for incarceration given the amount of pain that he has. We will get a CT scan to evaluate further. In the meantime, I will initiate symptomatically treatment with a medicine, nausea medicine and IV fluids. The patient will remain nothing by mouth until we have the results of his diagnostic workup.  ----------------------------------------- 11:45 PM on 05/17/2015 -----------------------------------------   The patient has evidence of incarcerated bowel in his hernia on CT scan. Clinically, the patient has hemodynamic stable and his pain has significantly improved in the emergency department. I've spoken with Dr. Adonis Huguenin, who will come evaluate the patient and review his CT scan. His physician will be made by Dr. Adonis Huguenin for intervention at [facility] versus adenopathy facility.  ____________________________________________  FINAL CLINICAL IMPRESSION(S) /  ED DIAGNOSES  Final diagnoses:  Incarcerated hernia of abdominal cavity  Non-intractable vomiting with nausea, vomiting of unspecified type      NEW MEDICATIONS STARTED DURING THIS VISIT:  New Prescriptions   No medications on file     Eula Listen, MD 05/17/15 [id number]

## 2015-05-17 NOTE — ED Notes (Signed)
Pt arrived via EMS c/o abdominal and chest pain. Per EMS pt has abdoimnal distention due to hernia, pt reported to EMS having diverticulitis as well and had eaten 2 cans of corn which further increased his abdominal pain.

## 2015-05-18 ENCOUNTER — Inpatient Hospital Stay: Payer: Medicare PPO

## 2015-05-18 DIAGNOSIS — K436 Other and unspecified ventral hernia with obstruction, without gangrene: Secondary | ICD-10-CM

## 2015-05-18 DIAGNOSIS — I10 Essential (primary) hypertension: Secondary | ICD-10-CM | POA: Diagnosis present

## 2015-05-18 DIAGNOSIS — K660 Peritoneal adhesions (postprocedural) (postinfection): Secondary | ICD-10-CM | POA: Diagnosis present

## 2015-05-18 DIAGNOSIS — R502 Drug induced fever: Secondary | ICD-10-CM | POA: Diagnosis not present

## 2015-05-18 DIAGNOSIS — J9600 Acute respiratory failure, unspecified whether with hypoxia or hypercapnia: Secondary | ICD-10-CM | POA: Diagnosis not present

## 2015-05-18 DIAGNOSIS — D696 Thrombocytopenia, unspecified: Secondary | ICD-10-CM | POA: Diagnosis present

## 2015-05-18 DIAGNOSIS — K56609 Unspecified intestinal obstruction, unspecified as to partial versus complete obstruction: Secondary | ICD-10-CM | POA: Diagnosis present

## 2015-05-18 DIAGNOSIS — G9341 Metabolic encephalopathy: Secondary | ICD-10-CM | POA: Diagnosis not present

## 2015-05-18 DIAGNOSIS — J158 Pneumonia due to other specified bacteria: Secondary | ICD-10-CM | POA: Diagnosis not present

## 2015-05-18 DIAGNOSIS — R339 Retention of urine, unspecified: Secondary | ICD-10-CM | POA: Diagnosis not present

## 2015-05-18 DIAGNOSIS — E876 Hypokalemia: Secondary | ICD-10-CM | POA: Diagnosis present

## 2015-05-18 DIAGNOSIS — K460 Unspecified abdominal hernia with obstruction, without gangrene: Secondary | ICD-10-CM | POA: Diagnosis not present

## 2015-05-18 DIAGNOSIS — Z8249 Family history of ischemic heart disease and other diseases of the circulatory system: Secondary | ICD-10-CM | POA: Diagnosis not present

## 2015-05-18 DIAGNOSIS — E860 Dehydration: Secondary | ICD-10-CM | POA: Diagnosis present

## 2015-05-18 DIAGNOSIS — J9621 Acute and chronic respiratory failure with hypoxia: Secondary | ICD-10-CM | POA: Diagnosis not present

## 2015-05-18 DIAGNOSIS — N390 Urinary tract infection, site not specified: Secondary | ICD-10-CM | POA: Diagnosis not present

## 2015-05-18 DIAGNOSIS — T361X5A Adverse effect of cephalosporins and other beta-lactam antibiotics, initial encounter: Secondary | ICD-10-CM | POA: Diagnosis not present

## 2015-05-18 DIAGNOSIS — K913 Postprocedural intestinal obstruction: Secondary | ICD-10-CM | POA: Diagnosis not present

## 2015-05-18 DIAGNOSIS — Z85528 Personal history of other malignant neoplasm of kidney: Secondary | ICD-10-CM | POA: Diagnosis not present

## 2015-05-18 DIAGNOSIS — Z7982 Long term (current) use of aspirin: Secondary | ICD-10-CM | POA: Diagnosis not present

## 2015-05-18 DIAGNOSIS — F10231 Alcohol dependence with withdrawal delirium: Secondary | ICD-10-CM | POA: Diagnosis not present

## 2015-05-18 DIAGNOSIS — Z9889 Other specified postprocedural states: Secondary | ICD-10-CM | POA: Diagnosis not present

## 2015-05-18 DIAGNOSIS — R739 Hyperglycemia, unspecified: Secondary | ICD-10-CM | POA: Diagnosis not present

## 2015-05-18 DIAGNOSIS — Z8551 Personal history of malignant neoplasm of bladder: Secondary | ICD-10-CM | POA: Diagnosis not present

## 2015-05-18 DIAGNOSIS — J690 Pneumonitis due to inhalation of food and vomit: Secondary | ICD-10-CM | POA: Diagnosis not present

## 2015-05-18 DIAGNOSIS — F17210 Nicotine dependence, cigarettes, uncomplicated: Secondary | ICD-10-CM | POA: Diagnosis present

## 2015-05-18 DIAGNOSIS — K450 Other specified abdominal hernia with obstruction, without gangrene: Secondary | ICD-10-CM | POA: Diagnosis present

## 2015-05-18 DIAGNOSIS — F1010 Alcohol abuse, uncomplicated: Secondary | ICD-10-CM | POA: Diagnosis not present

## 2015-05-18 DIAGNOSIS — I9581 Postprocedural hypotension: Secondary | ICD-10-CM | POA: Diagnosis not present

## 2015-05-18 DIAGNOSIS — I714 Abdominal aortic aneurysm, without rupture: Secondary | ICD-10-CM | POA: Diagnosis present

## 2015-05-18 DIAGNOSIS — J440 Chronic obstructive pulmonary disease with acute lower respiratory infection: Secondary | ICD-10-CM | POA: Diagnosis present

## 2015-05-18 DIAGNOSIS — K5669 Other intestinal obstruction: Secondary | ICD-10-CM | POA: Diagnosis not present

## 2015-05-18 DIAGNOSIS — I739 Peripheral vascular disease, unspecified: Secondary | ICD-10-CM | POA: Diagnosis present

## 2015-05-18 DIAGNOSIS — K55029 Acute infarction of small intestine, extent unspecified: Secondary | ICD-10-CM | POA: Diagnosis present

## 2015-05-18 DIAGNOSIS — I4891 Unspecified atrial fibrillation: Secondary | ICD-10-CM | POA: Diagnosis present

## 2015-05-18 DIAGNOSIS — E870 Hyperosmolality and hypernatremia: Secondary | ICD-10-CM | POA: Diagnosis present

## 2015-05-18 DIAGNOSIS — Z825 Family history of asthma and other chronic lower respiratory diseases: Secondary | ICD-10-CM | POA: Diagnosis not present

## 2015-05-18 DIAGNOSIS — J441 Chronic obstructive pulmonary disease with (acute) exacerbation: Secondary | ICD-10-CM | POA: Diagnosis not present

## 2015-05-18 DIAGNOSIS — G9340 Encephalopathy, unspecified: Secondary | ICD-10-CM | POA: Diagnosis not present

## 2015-05-18 DIAGNOSIS — Z906 Acquired absence of other parts of urinary tract: Secondary | ICD-10-CM | POA: Diagnosis not present

## 2015-05-18 DIAGNOSIS — Y92230 Patient room in hospital as the place of occurrence of the external cause: Secondary | ICD-10-CM | POA: Diagnosis not present

## 2015-05-18 DIAGNOSIS — J9601 Acute respiratory failure with hypoxia: Secondary | ICD-10-CM | POA: Diagnosis not present

## 2015-05-18 LAB — CBC
HEMATOCRIT: 52 % (ref 40.0–52.0)
Hemoglobin: 17.5 g/dL (ref 13.0–18.0)
MCH: 31.9 pg (ref 26.0–34.0)
MCHC: 33.6 g/dL (ref 32.0–36.0)
MCV: 95 fL (ref 80.0–100.0)
Platelets: 172 10*3/uL (ref 150–440)
RBC: 5.47 MIL/uL (ref 4.40–5.90)
RDW: 13.7 % (ref 11.5–14.5)
WBC: 9.1 10*3/uL (ref 3.8–10.6)

## 2015-05-18 LAB — COMPREHENSIVE METABOLIC PANEL
ALT: 13 U/L — ABNORMAL LOW (ref 17–63)
AST: 13 U/L — AB (ref 15–41)
Albumin: 3.8 g/dL (ref 3.5–5.0)
Alkaline Phosphatase: 55 U/L (ref 38–126)
Anion gap: 9 (ref 5–15)
BILIRUBIN TOTAL: 1 mg/dL (ref 0.3–1.2)
BUN: 11 mg/dL (ref 6–20)
CALCIUM: 8.7 mg/dL — AB (ref 8.9–10.3)
CO2: 25 mmol/L (ref 22–32)
Chloride: 106 mmol/L (ref 101–111)
Creatinine, Ser: 0.91 mg/dL (ref 0.61–1.24)
GFR calc Af Amer: >60 mL/min (ref >60)
Glucose, Bld: 110 mg/dL — ABNORMAL HIGH (ref 65–99)
POTASSIUM: 3.9 mmol/L (ref 3.5–5.1)
Sodium: 140 mmol/L (ref 135–145)
TOTAL PROTEIN: 6.8 g/dL (ref 6.5–8.1)

## 2015-05-18 LAB — ABO/RH: ABO/RH(D): O POS

## 2015-05-18 LAB — MAGNESIUM: MAGNESIUM: 2 mg/dL (ref 1.7–2.4)

## 2015-05-18 LAB — PHOSPHORUS: Phosphorus: 4.2 mg/dL (ref 2.5–4.6)

## 2015-05-18 LAB — PROTIME-INR
INR: 1.13
PROTHROMBIN TIME: 14.7 s (ref 11.4–15.0)

## 2015-05-18 LAB — APTT: aPTT: 29 seconds (ref 24–36)

## 2015-05-18 MED ORDER — ONDANSETRON HCL 4 MG/2ML IJ SOLN: 4.0000 mg | Freq: Four times a day (QID) | INTRAMUSCULAR | Status: DC | PRN

## 2015-05-18 MED ORDER — LORAZEPAM 2 MG/ML IJ SOLN
0.0000 mg | Freq: Four times a day (QID) | INTRAMUSCULAR | Status: AC
  Administered 2015-05-18 – 2015-05-20 (×3): 2 mg via INTRAVENOUS
  Filled 2015-05-18 (×4): qty 1

## 2015-05-18 MED ORDER — THIAMINE HCL 100 MG/ML IJ SOLN
100.0000 mg | Freq: Every day | INTRAMUSCULAR | Status: DC
Start: 2015-05-18 — End: 2015-05-30
  Administered 2015-05-18 – 2015-05-29 (×10): 100 mg via INTRAVENOUS
  Filled 2015-05-18 (×11): qty 2

## 2015-05-18 MED ORDER — BUDESONIDE 0.25 MG/2ML IN SUSP
0.2500 mg | Freq: Two times a day (BID) | RESPIRATORY_TRACT | Status: DC
  Administered 2015-05-18 – 2015-05-21 (×7): 0.25 mg via RESPIRATORY_TRACT
  Filled 2015-05-18 (×8): qty 2

## 2015-05-18 MED ORDER — TIOTROPIUM BROMIDE MONOHYDRATE 18 MCG IN CAPS
18.0000 ug | ORAL_CAPSULE | Freq: Every day | RESPIRATORY_TRACT | Status: DC
  Administered 2015-05-18 – 2015-05-19 (×2): 18 ug via RESPIRATORY_TRACT
  Filled 2015-05-18: qty 5

## 2015-05-18 MED ORDER — NICOTINE 21 MG/24HR TD PT24
21.0000 mg | MEDICATED_PATCH | Freq: Every day | TRANSDERMAL | Status: DC
  Administered 2015-05-18 – 2015-05-19 (×2): 21 mg via TRANSDERMAL
  Filled 2015-05-18 (×2): qty 1

## 2015-05-18 MED ORDER — FOLIC ACID 1 MG PO TABS
1.0000 mg | ORAL_TABLET | Freq: Every day | ORAL | Status: DC
  Filled 2015-05-18: qty 1

## 2015-05-18 MED ORDER — HYDROMORPHONE HCL 1 MG/ML IJ SOLN
0.5000 mg | INTRAMUSCULAR | Status: DC | PRN
  Administered 2015-05-18 – 2015-05-19 (×10): 0.5 mg via INTRAVENOUS
  Filled 2015-05-18 (×10): qty 1

## 2015-05-18 MED ORDER — LABETALOL HCL 5 MG/ML IV SOLN
10.0000 mg | INTRAVENOUS | Status: DC | PRN
  Filled 2015-05-18: qty 4

## 2015-05-18 MED ORDER — NICOTINE POLACRILEX 2 MG MT GUM
2.0000 mg | CHEWING_GUM | OROMUCOSAL | Status: DC | PRN
  Filled 2015-05-18: qty 1

## 2015-05-18 MED ORDER — DIPHENHYDRAMINE HCL 12.5 MG/5ML PO ELIX
12.5000 mg | ORAL_SOLUTION | Freq: Four times a day (QID) | ORAL | Status: DC | PRN
  Filled 2015-05-18: qty 5

## 2015-05-18 MED ORDER — ALBUTEROL SULFATE (2.5 MG/3ML) 0.083% IN NEBU: 3.0000 mL | INHALATION_SOLUTION | RESPIRATORY_TRACT | Status: DC | PRN

## 2015-05-18 MED ORDER — KCL IN DEXTROSE-NACL 20-5-0.9 MEQ/L-%-% IV SOLN
INTRAVENOUS | Status: DC
  Administered 2015-05-18 – 2015-05-19 (×4): via INTRAVENOUS
  Filled 2015-05-18 (×5): qty 1000

## 2015-05-18 MED ORDER — NICOTINE 21 MG/24HR TD PT24: 21.0000 mg | MEDICATED_PATCH | Freq: Every day | TRANSDERMAL | Status: DC

## 2015-05-18 MED ORDER — DIPHENHYDRAMINE HCL 50 MG/ML IJ SOLN: 12.5000 mg | Freq: Four times a day (QID) | INTRAMUSCULAR | Status: DC | PRN

## 2015-05-18 MED ORDER — ENOXAPARIN SODIUM 40 MG/0.4ML ~~LOC~~ SOLN
40.0000 mg | SUBCUTANEOUS | Status: DC
  Administered 2015-05-18 – 2015-05-21 (×3): 40 mg via SUBCUTANEOUS
  Filled 2015-05-18 (×3): qty 0.4

## 2015-05-18 MED ORDER — ADULT MULTIVITAMIN W/MINERALS CH
1.0000 | ORAL_TABLET | Freq: Every day | ORAL | Status: DC
  Filled 2015-05-18: qty 1

## 2015-05-18 MED ORDER — ALBUTEROL SULFATE (2.5 MG/3ML) 0.083% IN NEBU
3.0000 mL | INHALATION_SOLUTION | RESPIRATORY_TRACT | Status: DC
  Administered 2015-05-18 – 2015-05-21 (×16): 3 mL via RESPIRATORY_TRACT
  Filled 2015-05-18 (×16): qty 3

## 2015-05-18 MED ORDER — LORAZEPAM 2 MG/ML IJ SOLN
1.0000 mg | Freq: Four times a day (QID) | INTRAMUSCULAR | Status: DC | PRN
  Administered 2015-05-19 – 2015-05-21 (×5): 1 mg via INTRAVENOUS
  Filled 2015-05-18 (×7): qty 1

## 2015-05-18 MED ORDER — HYDROMORPHONE HCL 1 MG/ML IJ SOLN
0.5000 mg | Freq: Once | INTRAMUSCULAR | Status: AC
  Administered 2015-05-18: 0.5 mg via INTRAVENOUS
  Filled 2015-05-18: qty 1

## 2015-05-18 MED ORDER — LACTATED RINGERS IV SOLN
INTRAVENOUS | Status: DC
  Administered 2015-05-18 – 2015-05-20 (×5): via INTRAVENOUS
  Administered 2015-05-20: 1 mL via INTRAVENOUS
  Administered 2015-05-20 – 2015-05-21 (×2): via INTRAVENOUS

## 2015-05-18 MED ORDER — KCL IN DEXTROSE-NACL 20-5-0.45 MEQ/L-%-% IV SOLN
INTRAVENOUS | Status: DC
  Administered 2015-05-18: 12:00:00 via INTRAVENOUS
  Filled 2015-05-18 (×3): qty 1000

## 2015-05-18 MED ORDER — PANTOPRAZOLE SODIUM 40 MG IV SOLR
40.0000 mg | Freq: Every day | INTRAVENOUS | Status: DC
  Administered 2015-05-18 – 2015-05-21 (×4): 40 mg via INTRAVENOUS
  Filled 2015-05-18 (×4): qty 40

## 2015-05-18 MED ORDER — LORAZEPAM 0.5 MG PO TABS: 1.0000 mg | ORAL_TABLET | Freq: Four times a day (QID) | ORAL | Status: DC | PRN

## 2015-05-18 MED ORDER — HYDRALAZINE HCL 20 MG/ML IJ SOLN: 10.0000 mg | INTRAMUSCULAR | Status: DC | PRN

## 2015-05-18 MED ORDER — LORAZEPAM 2 MG/ML IJ SOLN
0.0000 mg | Freq: Two times a day (BID) | INTRAMUSCULAR | Status: DC
  Administered 2015-05-21: 1 mg via INTRAVENOUS

## 2015-05-18 MED ORDER — VITAMIN B-1 100 MG PO TABS
100.0000 mg | ORAL_TABLET | Freq: Every day | ORAL | Status: DC
Start: 2015-05-18 — End: 2015-05-21
  Filled 2015-05-18: qty 1

## 2015-05-18 MED ORDER — ONDANSETRON 8 MG PO TBDP
4.0000 mg | ORAL_TABLET | Freq: Four times a day (QID) | ORAL | Status: DC | PRN
  Filled 2015-05-18: qty 1

## 2015-05-18 MED ORDER — MORPHINE SULFATE (PF) 2 MG/ML IV SOLN
2.0000 mg | INTRAVENOUS | Status: DC | PRN
  Administered 2015-05-18: 2 mg via INTRAVENOUS
  Filled 2015-05-18: qty 1

## 2015-05-18 MED ORDER — CLONIDINE HCL 0.3 MG/24HR TD PTWK
0.3000 mg | MEDICATED_PATCH | TRANSDERMAL | Status: DC
  Administered 2015-05-18: 0.3 mg via TRANSDERMAL
  Filled 2015-05-18: qty 1

## 2015-05-18 NOTE — Care Management (Signed)
RNCM consult placed for concern of living situations.  CSW notified.  RNCM signing off

## 2015-05-18 NOTE — ED Notes (Signed)
Report called to RN on floor. Pt may be transported to room 209 once there is a bed in room.

## 2015-05-18 NOTE — H&P (Signed)
Patient ID: Brady Smith, male   DOB: 1945-10-17, 70 y.o.   MRN: [id number]  CC: Hernia  HPI Brady Smith is a 70 y.o. male who presents to emergency department for a worsening abdominal hernia. Patient states that after eating a large volume of corn last night he started having some nausea followed by swelling in his midline and a new area of "bump". He states that he throughout numerous times but has not since he came to the emergency department today. He's had a known history of a very large midline hernia and this is his fourth presentation to our group for this identical complaint. Patient states that he was told that the last 2 visits that he should seek his hernia repair at a [facility]. He did not follow up with this advice. Patient currently only complaining of abdominal pain at the site of his hernia. He denies any current nausea, vomiting, chest pain, shortness of breath. He has been passing flatus and his last bowel movement was earlier today albeit small per the patient. Patient admits to smoking 2 packs of cigarettes per day. Patient also states that he drinks one to 2 cans of beer per day as well.  HPI  Past Medical History  Diagnosis Date  . History of kidney cancer   . Bladder cancer ([place])   . Pneumonia    COPD  Past Surgical History  Procedure Laterality Date  . Hernia repair    . Cholecystectomy    . Cystectomy w/ continent diversion  1996  . Prostatectomy  1996  . Tonsillectomy    . Colonoscopy  2000    Family History  Problem Relation Age of Onset  . COPD Sister   . Heart disease Mother   . Heart disease Father     Social History Social History  Substance Use Topics  . Smoking status: Current Every Day Smoker -- 2.00 packs/day for 57 years    Types: Cigarettes  . Smokeless tobacco: None     Comment: patient not ready to quit  . Alcohol Use: 8.4 oz/week    14 Standard drinks or equivalent per week    No Known Allergies  No current  facility-administered medications for this encounter.   Current Outpatient Prescriptions  Medication Sig Dispense Refill  . albuterol (VENTOLIN HFA) 108 (90 BASE) MCG/ACT inhaler Inhale 2 puffs into the lungs every 6 (six) hours as needed.    Marland Kitchen aspirin EC 81 MG tablet Take 81 mg by mouth every other day.    Marland Kitchen omeprazole (PRILOSEC) 20 MG capsule Take 1 capsule (20 mg total) by mouth daily. (Patient not taking: Reported on 02/08/2015) 30 capsule 3     Review of Systems A multi-point review of systems was asked and was negative except for the findings documented in the history of present illness  Physical Exam Blood pressure 152/86, pulse 71, temperature 98.5 F (36.9 C), temperature source Oral, resp. rate 18, height 5\' 8"  (1.727 m), weight 77.111 kg (170 lb), SpO2 98 %. CONSTITUTIONAL: Resting in bed in no acute distress. EYES: Pupils are equal, round, and reactive to light, Sclera are non-icteric. EARS, NOSE, MOUTH AND THROAT: The oropharynx is clear. The oral mucosa is pink and moist. Patient has poor dentition with multiple missing teeth. Hearing is intact to voice. LYMPH NODES:  Lymph nodes in the neck are grossly normal. RESPIRATORY:  Lungs are coarse in all fields. There is normal respiratory effort, with equal breath sounds bilaterally, and  without pathologic use of accessory muscles. Chronic cough. CARDIOVASCULAR: Heart is regular without murmurs, gallops, or rubs. GI: The abdomen is soft, and nondistended. There is an obvious very large midline hernia. The hernia contents protrude more with every cough the patient makes. The majority of the hernia contents are soft, however there are some tense areas of the lower midline. It is tender to palpation. There is no obvious skin changes or warmth to the hernia. GU: Rectal deferred.   MUSCULOSKELETAL: Normal muscle strength and tone. No cyanosis or edema.   SKIN: Turgor is good with nicotine staining and changes of chronic tobacco  use. NEUROLOGIC: Motor and sensation is grossly normal. Cranial nerves are grossly intact. PSYCH:  Oriented to person, place and time. Patient appeared to have flight of ideas during my interview.  Data Reviewed I personally reviewed the patient's labs and images. Patient's labs are primarily within normal limits without any obvious electrolyte abnormalities and no evidence of leukocytosis. CT scan shows a very large ventral hernia with fascial defects measuring at least 9 x 8 cm. There are contrast-containing dilated loops of small bowel going into the hernia with evidence of decompressed small bowel coming out of the hernia. This is consistent with a small bowel obstruction secondary to the hernia itself. There is no clear transition point within the hernia contents. There is no evidence of free fluid or free air. I have personally reviewed the patient's imaging, laboratory findings and medical records.    Assessment    Small bowel obstruction    Plan    70 year old male with a chronic, very large ventral incisional hernia. Appears to have a small bowel obstruction secondary to this. Patient's hernia contents border on loss of abdominal domain. Discussed with the patient his current options. He desires admission and understands that should he have recurrence of his nausea or vomiting that an NG tube will be placed for decompression. Plan for IV hydration, nothing by mouth, as needed medications. Given his chronic tobacco use and chronicity of his hernia any hernia operation would be difficult. Should he require operative intervention it would be to release the bowel obstruction with the understanding that likely would not repair his hernia.     Time spent with the patient was 60 minutes, with more than 50% of the time spent in face-to-face education, counseling and care coordination.     Clayburn Pert, MD FACS General Surgeon 05/18/2015, 12:22 AM

## 2015-05-18 NOTE — Progress Notes (Signed)
Called by nursing secondary to patient "acting strange" Patient rolling around on the floor attempted to gag himself per nursing staff.  Upon questioning the patient he is verbally combative about his lack of pain control as I did not order him the Dilaudid he received in the emergency department. Patient stating that his hernia has "gotten harder" since he was admitted and not better asking "when he do something?"  On exam his hernia is unchanged from admission  Discussed with the patient that we would changes pain medications and place an NG tube. Patient voiced understanding.  This may represent the beginnings of DTs. Will also order a CIWA protocol  Clayburn Pert, MD [place] [facility]

## 2015-05-18 NOTE — ED Provider Notes (Signed)
-----------------------------------------   12:17 AM on 05/18/2015 -----------------------------------------  Gen. surgery has seen the patient, they will be admitting to their service for continued treatment and workup.  Harvest Dark, MD 05/18/15 [phone]

## 2015-05-18 NOTE — Plan of Care (Signed)
Pt now agreeing to NG placement - just wants to have his pain controlled, but also doesn't want to "tied down?" Pt stated he voided at about 6AM - but not documented by staff (not sure if pt info is accurate).  Bladder scan showed 984cc - Dr. Burt Knack called and gave orders for 29F foley placement.  Order placed. Pt informed.  Pt refusing SCD and continues to be extremely impulsive.

## 2015-05-18 NOTE — ED Notes (Signed)
Pt transported to room 209 via stretcher by RN at this time.

## 2015-05-18 NOTE — Progress Notes (Signed)
CC: [facility] Subjective: Patient with a long-standing chronically incarcerated ventral hernia with no acute processes. Patient states she's not had a bowel movement since yesterday he vomited once yesterday has not vomited since and his abdominal pain is unchanged from prior to admission. Is no new abdominal pain that he has not been experiencing but he is asking for the lot it specifically. Patient has been seen by virtually all the physicians on our service and they have all recommended evaluation at either [facility] for ventral hernia repair that would require abdominal wall reconstruction and the potential for loss of domain. He has never Had an appointment with [facility] and they have been made for him in the past. He continues to smoke extremely heavily  Objective: Vital signs in last 24 hours: Temp:  [97.8 F (36.6 C)-98.5 F (36.9 C)] 97.8 F (36.6 C) (02/23 0527) Pulse Rate:  [61-78] 61 (02/23 0527) Resp:  [16-18] 18 (02/23 0527) BP: (134-179)/(66-89) 176/88 mmHg (02/23 1145) SpO2:  [92 %-98 %] 92 % (02/23 1134) Weight:  [170 lb (77.111 kg)-174 lb 1.6 oz (78.971 kg)] 174 lb 1.6 oz (78.971 kg) (02/23 0723)    Intake/Output from previous day:   Intake/Output this shift: Total I/O In: -  Out: 1200 [Urine:1200]  Physical exam:  Patient appears comfortable he has staining of his fingers and lips of with nicotine. Vital signs are reviewed and stable. Abdomen is nondistended and nontympanitic but he has is rather hard non-erythematous huge ventral hernia extending to the left side of midline which is completely nontender but the patient will not allow you to try to reduce the hernia and he is combative during the exam.  Lab Results: CBC   Recent Labs  05/17/15 2151 05/18/15 0423  WBC 8.9 9.1  HGB 18.4* 17.5  HCT 55.2* 52.0  PLT 185 172   BMET  Recent Labs  05/17/15 2151 05/18/15 0423  NA 140 140  K 4.1 3.9  CL 104 106  CO2 27 25  GLUCOSE 125* 110*  BUN  11 11  CREATININE 0.86 0.91  CALCIUM 9.3 8.7*   PT/INR  Recent Labs  05/17/15 2151 05/18/15 0423  LABPROT 13.5 14.7  INR 1.01 1.13   ABG No results for input(s): PHART, HCO3 in the last 72 hours.  Invalid input(s): PCO2, PO2  Studies/Results: Ct Abdomen Pelvis W Contrast  05/17/2015  CLINICAL DATA:  Generalized abdominal and chest pain since this afternoon. History bladder cancer. EXAM: CT ABDOMEN AND PELVIS WITH CONTRAST TECHNIQUE: Multidetector CT imaging of the abdomen and pelvis was performed using the standard protocol following bolus administration of intravenous contrast. CONTRAST:  182mL OMNIPAQUE IOHEXOL 300 MG/ML  SOLN COMPARISON:  07/27/2013.  04/12/2011 FINDINGS: Lower chest: Stable appearance of chronic right pleural thickening posteriorly. Hepatobiliary: No focal abnormality within the liver parenchyma. Gallbladder surgically absent. No intrahepatic or extrahepatic biliary dilation. Pancreas: No focal mass lesion. No dilatation of the main duct. No intraparenchymal cyst. No peripancreatic edema. Spleen: No splenomegaly. No focal mass lesion. Adrenals/Urinary Tract: Stable tiny bilateral adrenal nodules consistent with adenomas. Cortical scarring noted in the right kidney. No enhancing lesion in either kidney. Stable small right renal cysts. Left kidney is unremarkable. Mild right hydronephrosis is associated with right hydroureter with the right ureter reimplanted along the left anterior bladder wall. On previous exams, at this reimplanted ureter, potentially a conduit, had thin walls but demonstrates a thick enhancing wall on today's study. There is some mild wall thickening in the right  aspect of the neobladder. Left ureter also appears to tiny into the conduit. Stomach/Bowel: Stomach is mildly distended. Duodenum is normally positioned as is the ligament of Treitz. Multiple small bowel loops extend through a complex ventral hernia that also contains transverse colon. There is a  dilated small bowel loop measuring up to 3.8 cm that tracks into the hernia with decompressed, non-opacified small bowel exiting the hernia sac. The terminal ileum is normal. The appendix is not visualized, but there is no edema or inflammation in the region of the cecum. Transverse colon extends into the hernia sac without proximal colonic dilatation. Diverticular change noted in the left colon without diverticulitis. Vascular/Lymphatic: Abdominal aorta measures up to 3.0 cm and diameter, consistent with aneurysm. Atherosclerotic calcification is associated in the abdominal aortic wall. There is no gastrohepatic or hepatoduodenal ligament lymphadenopathy. No intraperitoneal or retroperitoneal lymphadenopathy. No pelvic sidewall lymphadenopathy. Reproductive: Prostate gland appears to be surgically absent. Other: No intraperitoneal free fluid. Musculoskeletal: Bone windows reveal no worrisome lytic or sclerotic osseous lesions. Bilateral pars interarticularis defects are seen at L5. IMPRESSION: Large complex ventral hernia as seen on multiple previous imaging studies. Small bowel and colon is contained within the hernia sac and on today's study, and there is a distended contrast filled small bowel loop that appears to track into the hernia where multiple dilated small bowel loops are present. Small bowel leaving the hernia sac is decompressed and non-opacified. As such, imaging features are suspicious for incarcerated small bowel with obstruction related to the ventral hernia. Patient appears to be status post cystectomy with urinary diversion to a neobladder. There is some wall thickening in the urinary conduit as it ties into the neobladder, but this is nonspecific. On previous studies, the wall to this structure was very thin and the wall thickening today makes inflammation a concern. There is also some new wall thickening associated with the right aspect of the neobladder. Stable tiny bilateral adrenal nodules,  likely representing adenomas. Abdominal aortic atherosclerosis with 3.0 cm aneurysm, stable. Electronically Signed   By: Misty Stanley M.D.   On: 05/17/2015 23:32   Dg Abd Portable 2v  05/18/2015  CLINICAL DATA:  Patient with generalized abdominal pain. Abdominal discomfort. EXAM: PORTABLE ABDOMEN - 2 VIEW COMPARISON:  CT abdomen pelvis 05/17/2015 FINDINGS: Enteric tube tip and side-port project over the stomach. Cholecystectomy clips. Lung bases are clear. There multiple prominent gaseous distended loops of small bowel within the central abdomen measuring up to 4.3 cm. Stool and gas demonstrated within the colon. Pelvic surgical clips. Lumbar spine degenerative changes. IMPRESSION: Enteric tube tip and side-port project over the stomach. Multiple gaseous distended loops of small bowel within the central abdomen concerning for early and/or partial bowel obstruction. Electronically Signed   By: Lovey Newcomer M.D.   On: 05/18/2015 11:19    Anti-infectives: Anti-infectives    None      Assessment/Plan:  CT scan is personally reviewed and discussed with Dr. Adonis Huguenin. Again this patient has seen multiple surgeons over the last few years myself included we have all recommended that he go to a tertiary center currently there are no acute processes but he has a nasogastric tube and a Foley catheter in place I will repeat a KUB an attempt reduction again but this is a long-standing incarceration and will not likely reduce without surgical intervention which we are not capable of providing at [facility]. The, patient rate from this patient is extreme with his smoking and likely loss of  domain. This was reviewed with the patient but he remains somewhat belligerent evasive and uncooperative.Florene Glen, MD, FACS  05/18/2015

## 2015-05-18 NOTE — Consult Note (Signed)
[facility] [place] NAME: Brady Smith    MR#:  [id number]  DATE OF BIRTH:  Apr 04, 1945  DATE OF ADMISSION:  05/17/2015  PRIMARY CARE PHYSICIAN: Halina Maidens, MD   REQUESTING/REFERRING PHYSICIAN: Dr. Adonis Huguenin  CHIEF COMPLAINT:   Chief Complaint  Patient presents with  . Chest Pain  . Abdominal Pain    HISTORY OF PRESENT ILLNESS:  Brady Smith  is a 70 y.o. male with a known history of bladder cancer, COPD, chronic respiratory failure, was supposed to be on oxygen, however, did not use it, who presents to the hospital with complaints of worsening abdominal hernia. I wanted to medical records. Patient ate large amount of corn and started having nausea and his hernia swelled up and became hard. He vomited numerous times at home and presented to the hospital for evaluation. I wanted to medical records. Patient had last bowel movement, small today . CT scan of abdomen and pelvis revealed ventral hernia with small bowel and colon was then hernia sac, incarcerated small bowel,  neobladder with some thickening of the wall.  Labs were unremarkable except of elevated hemoglobin and hematocrit levels signifying dehydration . NG tube was placed and patient is draining green colored gastric content. He is somewhat agitated and restless and not able to provide review of systems, not able to tell me if his pain is better after NG tube placement. He apparently had urinary retention with bladder scan revealing around 900 cc of urine, Foley catheter was recommended which patient initially refused as well, but after longer talk agreed. Patient's blood pressure is ranging between 160s to 200s. He denies any significant shortness of breath except of a chronic admits of some cough with whitish phlegm production which is normal for him. Denies any wheezing. Admits of smoking approximately 2 packs a day.  PAST MEDICAL HISTORY:   Past Medical History  Diagnosis Date   . History of kidney cancer   . Bladder cancer ([place])   . Pneumonia     PAST SURGICAL HISTOIRY:   Past Surgical History  Procedure Laterality Date  . Hernia repair    . Cholecystectomy    . Cystectomy w/ continent diversion  1996  . Prostatectomy  1996  . Tonsillectomy    . Colonoscopy  2000    SOCIAL HISTORY:   Social History  Substance Use Topics  . Smoking status: Current Every Day Smoker -- 2.00 packs/day for 57 years    Types: Cigarettes  . Smokeless tobacco: Not on file     Comment: patient not ready to quit  . Alcohol Use: 8.4 oz/week    14 Standard drinks or equivalent per week    FAMILY HISTORY:   Family History  Problem Relation Age of Onset  . COPD Sister   . Heart disease Mother   . Heart disease Father     DRUG ALLERGIES:  No Known Allergies  REVIEW OF SYSTEMS:  Unable to obtain due to patient's restlessness and agitation, complains of hernia being really hard some discomfort in suprapubic area, intermittent cough with some normal whitish phlegm production, some chronic shortness of breath .admits of catheterizing his bladder intermittently at home .     MEDICATIONS AT HOME:   Prior to Admission medications   Medication Sig Start Date End Date Taking? Authorizing Provider  albuterol (VENTOLIN HFA) 108 (90 BASE) MCG/ACT inhaler Inhale 2 puffs into the lungs every 6 (six) hours as needed.   Yes Historical  Provider, MD  aspirin EC 81 MG tablet Take 81 mg by mouth every other day.   Yes Historical Provider, MD  omeprazole (PRILOSEC) 20 MG capsule Take 1 capsule (20 mg total) by mouth daily. Patient not taking: Reported on 02/08/2015 08/30/14   Glean Hess, MD      VITAL SIGNS:  Blood pressure 179/83, pulse 61, temperature 97.8 F (36.6 C), temperature source Oral, resp. rate 18, height 5\' 8"  (1.727 m), weight 78.971 kg (174 lb 1.6 oz), SpO2 92 %.  PHYSICAL EXAMINATION:  GENERAL:  70 y.o.-year-old patient lying in the bed in mild to moderate  distress, intermittently restless and agitated, poorly cooperative.  EYES: Pupils equal, round, reactive to light and accommodation. No scleral icterus. Extraocular muscles intact.  HEENT: Head atraumatic, normocephalic. Oropharynx and nasopharynx clear. . Dry oral mucosa  NECK:  Supple, no jugular venous distention. No thyroid enlargement, no tenderness.  LUNgs: Diminished beath sounds bilaterally, no wheezing,  few rales,rhonchi , NO crepitationS. No use of accessory muscles of respiration.  CARDIOVASCULAR: S1, S2 normal. No murmurs, rubs, or gallops.  ABDOMEN: SofT, Diffusely tender, mostly around ventral hernia. Which is hard and painful to palpation, NONdistended. Bowel sounds present. No organomegaly or mass.  EXTREMITIES: No pedal edema, cyanosis, or clubbing.  NEUROLOGIC: Cranial nerves II through XII are intact. Muscle strength 5/5 in all extremities. Sensation intact. Gait not checked.  PSYCHIATRIC: The patient is alert and oriented x 3.  SKIN: No obvious rash, lesion, or ulcer.   LABORATORY PANEL:   CBC  Recent Labs Lab 05/18/15 0423  WBC 9.1  HGB 17.5  HCT 52.0  PLT 172   ------------------------------------------------------------------------------------------------------------------  Chemistries   Recent Labs Lab 05/18/15 0423  NA 140  K 3.9  CL 106  CO2 25  GLUCOSE 110*  BUN 11  CREATININE 0.91  CALCIUM 8.7*  MG 2.0  AST 13*  ALT 13*  ALKPHOS 55  BILITOT 1.0   ------------------------------------------------------------------------------------------------------------------  Cardiac Enzymes No results for input(s): TROPONINI in the last 168 hours. ------------------------------------------------------------------------------------------------------------------  RADIOLOGY:  Ct Abdomen Pelvis W Contrast  05/17/2015  CLINICAL DATA:  Generalized abdominal and chest pain since this afternoon. History bladder cancer. EXAM: CT ABDOMEN AND PELVIS WITH  CONTRAST TECHNIQUE: Multidetector CT imaging of the abdomen and pelvis was performed using the standard protocol following bolus administration of intravenous contrast. CONTRAST:  164mL OMNIPAQUE IOHEXOL 300 MG/ML  SOLN COMPARISON:  07/27/2013.  04/12/2011 FINDINGS: Lower chest: Stable appearance of chronic right pleural thickening posteriorly. Hepatobiliary: No focal abnormality within the liver parenchyma. Gallbladder surgically absent. No intrahepatic or extrahepatic biliary dilation. Pancreas: No focal mass lesion. No dilatation of the main duct. No intraparenchymal cyst. No peripancreatic edema. Spleen: No splenomegaly. No focal mass lesion. Adrenals/Urinary Tract: Stable tiny bilateral adrenal nodules consistent with adenomas. Cortical scarring noted in the right kidney. No enhancing lesion in either kidney. Stable small right renal cysts. Left kidney is unremarkable. Mild right hydronephrosis is associated with right hydroureter with the right ureter reimplanted along the left anterior bladder wall. On previous exams, at this reimplanted ureter, potentially a conduit, had thin walls but demonstrates a thick enhancing wall on today's study. There is some mild wall thickening in the right aspect of the neobladder. Left ureter also appears to tiny into the conduit. Stomach/Bowel: Stomach is mildly distended. Duodenum is normally positioned as is the ligament of Treitz. Multiple small bowel loops extend through a complex ventral hernia that also contains transverse colon. There is a dilated small  bowel loop measuring up to 3.8 cm that tracks into the hernia with decompressed, non-opacified small bowel exiting the hernia sac. The terminal ileum is normal. The appendix is not visualized, but there is no edema or inflammation in the region of the cecum. Transverse colon extends into the hernia sac without proximal colonic dilatation. Diverticular change noted in the left colon without diverticulitis.  Vascular/Lymphatic: Abdominal aorta measures up to 3.0 cm and diameter, consistent with aneurysm. Atherosclerotic calcification is associated in the abdominal aortic wall. There is no gastrohepatic or hepatoduodenal ligament lymphadenopathy. No intraperitoneal or retroperitoneal lymphadenopathy. No pelvic sidewall lymphadenopathy. Reproductive: Prostate gland appears to be surgically absent. Other: No intraperitoneal free fluid. Musculoskeletal: Bone windows reveal no worrisome lytic or sclerotic osseous lesions. Bilateral pars interarticularis defects are seen at L5. IMPRESSION: Large complex ventral hernia as seen on multiple previous imaging studies. Small bowel and colon is contained within the hernia sac and on today's study, and there is a distended contrast filled small bowel loop that appears to track into the hernia where multiple dilated small bowel loops are present. Small bowel leaving the hernia sac is decompressed and non-opacified. As such, imaging features are suspicious for incarcerated small bowel with obstruction related to the ventral hernia. Patient appears to be status post cystectomy with urinary diversion to a neobladder. There is some wall thickening in the urinary conduit as it ties into the neobladder, but this is nonspecific. On previous studies, the wall to this structure was very thin and the wall thickening today makes inflammation a concern. There is also some new wall thickening associated with the right aspect of the neobladder. Stable tiny bilateral adrenal nodules, likely representing adenomas. Abdominal aortic atherosclerosis with 3.0 cm aneurysm, stable. Electronically Signed   By: Misty Stanley M.D.   On: 05/17/2015 23:32    EKG:   Orders placed or performed in visit on 07/27/13  . EKG 12-Lead    IMPRESSION AND PLAN:    Principal Problem:   Small bowel obstruction (HCC) Active Problems:   Incarcerated ventral hernia   Essential hypertension, malignant   Urinary  retention   UTI (urinary tract infection)  #1. Small bowel obstruction due to incarcerated ventral hernia, and G-tube is placed, further management per surgery, continue pain medications as well as IV fluids . #2. COPD, no obvious exacerbation, continue patient on budesonide, tiotropium, albuterol inhalers, Advair, oxygen as needed. #3. Urinary retention, Foley catheter is being placed, follow urinary output #4. Pyuria. Suspected acute cystitis without hematuria, urinary cultures and initiate patient on the Rocephin If febrile or elevated white blood cell count or urinary cultures came back positive #5, malignant essential hypertension. Initiate patient on clonidine patch, add labetalol as needed intravenously. #6. Dehydration. Follow with IV fluid administration 7 tobacco abuse counseling. Discussed this patient for approximately 3-4 minutes, refused a nicotine replacement therapy, placing nicotine and Nicorette orders for agreeable      All the records are reviewed and case discussed with Consulting provider. Management plans discussed with the patient, family and they are in agreement.  CODStatus full code TOTAL TIME TAKING CARE OF THIS PAT50 minutes.    Theodoro Grist M.D on 05/18/2015 at 9:31 AM  Between 7am to 6pm - Pager - [phone]  After 6pm go to [email or web address] - password [email or web address] [facility]  [facility]  Office  [phone]  CC: Primary care Physician: Halina Maidens, MD

## 2015-05-19 ENCOUNTER — Inpatient Hospital Stay: Payer: Medicare PPO

## 2015-05-19 LAB — BASIC METABOLIC PANEL
Anion gap: 5 (ref 5–15)
BUN: 14 mg/dL (ref 6–20)
CHLORIDE: 110 mmol/L (ref 101–111)
CO2: 26 mmol/L (ref 22–32)
Calcium: 8.4 mg/dL — ABNORMAL LOW (ref 8.9–10.3)
Creatinine, Ser: 0.9 mg/dL (ref 0.61–1.24)
GFR calc Af Amer: >60 mL/min (ref >60)
GFR calc non Af Amer: >60 mL/min (ref >60)
GLUCOSE: 186 mg/dL — AB (ref 65–99)
POTASSIUM: 4.3 mmol/L (ref 3.5–5.1)
Sodium: 141 mmol/L (ref 135–145)

## 2015-05-19 LAB — CBC WITH DIFFERENTIAL/PLATELET
Basophils Absolute: 0 10*3/uL (ref 0–0.1)
Basophils Relative: 0 %
Eosinophils Absolute: 0 10*3/uL (ref 0–0.7)
Eosinophils Relative: 0 %
HCT: 48.5 % (ref 40.0–52.0)
HEMOGLOBIN: 16.4 g/dL (ref 13.0–18.0)
LYMPHS ABS: 0.3 10*3/uL — AB (ref 1.0–3.6)
LYMPHS PCT: 2 %
MCH: 32 pg (ref 26.0–34.0)
MCHC: 33.9 g/dL (ref 32.0–36.0)
MCV: 94.5 fL (ref 80.0–100.0)
Monocytes Absolute: 1.2 10*3/uL — ABNORMAL HIGH (ref 0.2–1.0)
Monocytes Relative: 9 %
NEUTROS PCT: 89 %
Neutro Abs: 12.8 10*3/uL — ABNORMAL HIGH (ref 1.4–6.5)
Platelets: 155 10*3/uL (ref 150–440)
RBC: 5.13 MIL/uL (ref 4.40–5.90)
RDW: 13.5 % (ref 11.5–14.5)
WBC: 14.3 10*3/uL — AB (ref 3.8–10.6)

## 2015-05-19 LAB — GLUCOSE, CAPILLARY
GLUCOSE-CAPILLARY: 198 mg/dL — AB (ref 65–99)
GLUCOSE-CAPILLARY: 174 mg/dL — AB (ref 65–99)
Glucose-Capillary: 164 mg/dL — ABNORMAL HIGH (ref 65–99)

## 2015-05-19 LAB — BLOOD GAS, ARTERIAL
Acid-Base Excess: 0.6 mmol/L (ref 0.0–3.0)
Bicarbonate: 23.3 mEq/L (ref 21.0–28.0)
FIO2: 21
O2 SAT: 89.3 %
PCO2 ART: 32 mmHg (ref 32.0–48.0)
PO2 ART: 53 mmHg — AB (ref 83.0–108.0)
Patient temperature: 37
pH, Arterial: 7.47 — ABNORMAL HIGH (ref 7.350–7.450)

## 2015-05-19 LAB — URINE CULTURE
Culture: NO GROWTH
Special Requests: NORMAL

## 2015-05-19 MED ORDER — HYDROMORPHONE HCL 1 MG/ML IJ SOLN
0.5000 mg | INTRAMUSCULAR | Status: DC | PRN
Start: 2015-05-19 — End: 2015-05-21
  Administered 2015-05-19 – 2015-05-21 (×2): 0.5 mg via INTRAVENOUS
  Filled 2015-05-19 (×2): qty 1

## 2015-05-19 MED ORDER — SODIUM CHLORIDE 0.9 % IJ SOLN
INTRAMUSCULAR | Status: AC
  Filled 2015-05-19: qty 10

## 2015-05-19 NOTE — Progress Notes (Signed)
No urine output noted since 10:50 this am. Bladder scan was showing 999 ml. So I called Dr. Dahlia Byes and he instructed me to flush it. So I flushed it with sterile water and now it;s draining nicely. The irrigated urine has a lot of sediments and mucous particles in it. It is still draining at this time.

## 2015-05-19 NOTE — Progress Notes (Signed)
At the request of Dr. Perrin Maltese I visited with the patient as he had never seen the patient before and was concerned about his abdominal exam and mental status. I had seen the patient once yesterday and Dr. Adonis Huguenin and seen the patient before and Dr. Adonis Huguenin and I discussed his mental status and abdominal exam as well.  Dr. Perrin Maltese was concerned about peritonitis from his incarcerated ventral hernia as well as his mental status.  A personally reviewed his vital signs he is afebrile and heart rate shows mild tachycardia but a normal blood pressure. Normal respiratory rate.  Patient is awake and alert and answers questions somewhat disoriented but his mental status exam is essentially unchanged from yesterday. Abdominal exam is unchanged from yesterday as well he has this hard chronically incarcerated ventral hernia which is minimally tender it is soft cephalad and hard inferior but his remainder of his abdominal exam especially on the right side away from this hernia is completely soft nontender without guarding or rebound or percussion tenderness.  I also personally reviewed his labs showing a slight increase in his white blood cell count but he is not acidotic  Assessment plan this patient with a chronically incarcerated ventral hernia and his exam is unchanged from yesterday both his mental status and his abdominal exam is consistent. My concern is that he could be withdrawing from alcohol use but I'm not sure of that.  This patient represents a very difficult situation in that he has been referred over and over again over the past several years to tertiary center to have his hernia repaired as no surgeon at [facility] felt that he was a candidate for surgery at this facility because of the loss of domain and the difficulty with his smoking and COPD. Skin infected mesh and recurrence and never getting off the ventilator due to loss of domain has been discussed with him in the past  both by other surgeons and by me personally yesterday as well as on prior visits. He has never Had an appointment with his tertiary centers that we have sent him to. This speaks indirectly to his desire to have this repaired at all and whether or not he can give consent to an operation that no one at [facility] believes he should have year.  Difficult condition was discussed with Dr. Perrin Maltese who is in agreement with my assessment and he was present during my exam.

## 2015-05-19 NOTE — Plan of Care (Signed)
Problem: Fluid Volume: Goal: Ability to maintain a balanced intake and output will improve Outcome: Not Progressing Pt is NPO but is noncompliant and continues to drink water from the sink.

## 2015-05-19 NOTE — Progress Notes (Signed)
Contacted Dr. Adonis Huguenin. Pt is noncompliant with fall protocol and repeatedly gets out of bed. Pt continues to try and remove IV. Sitter ordered for pt safety.

## 2015-05-19 NOTE — Progress Notes (Signed)
Spoke with Dr. Adonis Huguenin regarding pts behavior. Pt is attempting to remove IV, reporting that he has places to go. Explained to pt the time of night and the IV was needed while he was admitted. No new orders for pt at this time.

## 2015-05-19 NOTE — Consult Note (Signed)
[facility] [place] NAME: Brady Smith    MR#:  [id number]  DATE OF BIRTH:  1946-01-22  DATE OF ADMISSION:  05/17/2015  PRIMARY CARE PHYSICIAN: Halina Maidens, MD   REQUESTING/REFERRING PHYSICIAN: Dr. Adonis Huguenin  CHIEF COMPLAINT:   Chief Complaint  Patient presents with  . Chest Pain  . Abdominal Pain    HISTORY OF PRESENT ILLNESS:  Brady Smith  is a 70 y.o. male with a known history of bladder cancer, COPD, chronic respiratory failure, was supposed to be on oxygen, however, did not use it, who presents to the hospital with complaints of worsening abdominal hernia. I wanted to medical records. Patient ate large amount of corn and started having nausea and his hernia swelled up and became hard. He vomited numerous times at home and presented to the hospital for evaluation. I wanted to medical records. Patient had last bowel movement, small today . CT scan of abdomen and pelvis revealed ventral hernia with small bowel and colon was then hernia sac, incarcerated small bowel,  neobladder with some thickening of the wall.  Labs were unremarkable except of elevated hemoglobin and hematocrit levels signifying dehydration . NG tube was placed initially, however, now NG tube was discontinued . Patient is somnolent and difficult to arouse, however, receiving Dilaudid. Denies any significant discomfort except of abdominal pain mostly on palpation. Denies any shortness of breath. Admits of improvement of his wheezing PAST MEDICAL HISTORY:   Past Medical History  Diagnosis Date  . History of kidney cancer   . Bladder cancer ([place])   . Pneumonia     PAST SURGICAL HISTOIRY:   Past Surgical History  Procedure Laterality Date  . Hernia repair    . Cholecystectomy    . Cystectomy w/ continent diversion  1996  . Prostatectomy  1996  . Tonsillectomy    . Colonoscopy  2000    SOCIAL HISTORY:   Social History  Substance Use Topics  . Smoking  status: Current Every Day Smoker -- 2.00 packs/day for 57 years    Types: Cigarettes  . Smokeless tobacco: Not on file     Comment: patient not ready to quit  . Alcohol Use: 8.4 oz/week    14 Standard drinks or equivalent per week    FAMILY HISTORY:   Family History  Problem Relation Age of Onset  . COPD Sister   . Heart disease Mother   . Heart disease Father     DRUG ALLERGIES:  No Known Allergies  REVIEW OF SYSTEMS:  Unable to obtain due to patient's somnolence .     MEDICATIONS AT HOME:   Prior to Admission medications   Medication Sig Start Date End Date Taking? Authorizing Provider  albuterol (VENTOLIN HFA) 108 (90 BASE) MCG/ACT inhaler Inhale 2 puffs into the lungs every 6 (six) hours as needed.   Yes Historical Provider, MD  aspirin EC 81 MG tablet Take 81 mg by mouth every other day.   Yes Historical Provider, MD  omeprazole (PRILOSEC) 20 MG capsule Take 1 capsule (20 mg total) by mouth daily. Patient not taking: Reported on 02/08/2015 08/30/14   Glean Hess, MD      VITAL SIGNS:  Blood pressure 164/76, pulse 91, temperature 98.1 F (36.7 C), temperature source Oral, resp. rate 18, height 5\' 8"  (1.727 m), weight 78.971 kg (174 lb 1.6 oz), SpO2 93 %.  PHYSICAL EXAMINATION:  GENERAL:  71 y.o.-year-old patient lying in the bed , sleepy ,  but able to open his eyes and converses, answers questions appropriately , EYES: Pupils equal, round, reactive to light and accommodation. No scleral icterus. Extraocular muscles intact.  HEENT: Head atraumatic, normocephalic. Oropharynx and nasopharynx clear. . Dry oral mucosa  NECK:  Supple, no jugular venous distention. No thyroid enlargement, no tenderness.  LUNgsBetter air entrance bilaterally, no wheezing,  few rales,rhonchi , NO crepitationS. No use of accessory muscles of respiration.  CARDIOVASCULAR: S1, S2 normal. No murmurs, rubs, or gallops.  ABDOMEN: SofTwith some voluntary guarding in left lower quadrant , tender  around ventral hernia, which is hard and painful to palpation, NONdistended. Bowel sounds present. No organomegaly or mass.  EXTREMITIES: No pedal edema, cyanosis, or clubbing.  NEUROLOGIC: Cranial nerves II through XII are intact. Muscle strength 5/5 in all extremities. Sensation intact. Gait not checked.  PSYCHIATRIC: The patient isomnolent, difficult to assess his orientation due to somnolence, although able to answer questions appropriately and follow commands intermittently   SKIN: No obvious rash, lesion, or ulcer.   LABORATORY PANEL:   CBC  Recent Labs Lab 05/19/15 0719  WBC 14.3*  HGB 16.4  HCT 48.5  PLT 155   ------------------------------------------------------------------------------------------------------------------  Chemistries   Recent Labs Lab 05/18/15 0423 05/19/15 0719  NA 140 141  K 3.9 4.3  CL 106 110  CO2 25 26  GLUCOSE 110* 186*  BUN 11 14  CREATININE 0.91 0.90  CALCIUM 8.7* 8.4*  MG 2.0  --   AST 13*  --   ALT 13*  --   ALKPHOS 55  --   BILITOT 1.0  --    ------------------------------------------------------------------------------------------------------------------  Cardiac Enzymes No results for input(s): TROPONINI in the last 168 hours. ------------------------------------------------------------------------------------------------------------------  RADIOLOGY:  Ct Abdomen Pelvis W Contrast  05/17/2015  CLINICAL DATA:  Generalized abdominal and chest pain since this afternoon. History bladder cancer. EXAM: CT ABDOMEN AND PELVIS WITH CONTRAST TECHNIQUE: Multidetector CT imaging of the abdomen and pelvis was performed using the standard protocol following bolus administration of intravenous contrast. CONTRAST:  148mL OMNIPAQUE IOHEXOL 300 MG/ML  SOLN COMPARISON:  07/27/2013.  04/12/2011 FINDINGS: Lower chest: Stable appearance of chronic right pleural thickening posteriorly. Hepatobiliary: No focal abnormality within the liver parenchyma.  Gallbladder surgically absent. No intrahepatic or extrahepatic biliary dilation. Pancreas: No focal mass lesion. No dilatation of the main duct. No intraparenchymal cyst. No peripancreatic edema. Spleen: No splenomegaly. No focal mass lesion. Adrenals/Urinary Tract: Stable tiny bilateral adrenal nodules consistent with adenomas. Cortical scarring noted in the right kidney. No enhancing lesion in either kidney. Stable small right renal cysts. Left kidney is unremarkable. Mild right hydronephrosis is associated with right hydroureter with the right ureter reimplanted along the left anterior bladder wall. On previous exams, at this reimplanted ureter, potentially a conduit, had thin walls but demonstrates a thick enhancing wall on today's study. There is some mild wall thickening in the right aspect of the neobladder. Left ureter also appears to tiny into the conduit. Stomach/Bowel: Stomach is mildly distended. Duodenum is normally positioned as is the ligament of Treitz. Multiple small bowel loops extend through a complex ventral hernia that also contains transverse colon. There is a dilated small bowel loop measuring up to 3.8 cm that tracks into the hernia with decompressed, non-opacified small bowel exiting the hernia sac. The terminal ileum is normal. The appendix is not visualized, but there is no edema or inflammation in the region of the cecum. Transverse colon extends into the hernia sac without proximal colonic dilatation. Diverticular change noted  in the left colon without diverticulitis. Vascular/Lymphatic: Abdominal aorta measures up to 3.0 cm and diameter, consistent with aneurysm. Atherosclerotic calcification is associated in the abdominal aortic wall. There is no gastrohepatic or hepatoduodenal ligament lymphadenopathy. No intraperitoneal or retroperitoneal lymphadenopathy. No pelvic sidewall lymphadenopathy. Reproductive: Prostate gland appears to be surgically absent. Other: No intraperitoneal free  fluid. Musculoskeletal: Bone windows reveal no worrisome lytic or sclerotic osseous lesions. Bilateral pars interarticularis defects are seen at L5. IMPRESSION: Large complex ventral hernia as seen on multiple previous imaging studies. Small bowel and colon is contained within the hernia sac and on today's study, and there is a distended contrast filled small bowel loop that appears to track into the hernia where multiple dilated small bowel loops are present. Small bowel leaving the hernia sac is decompressed and non-opacified. As such, imaging features are suspicious for incarcerated small bowel with obstruction related to the ventral hernia. Patient appears to be status post cystectomy with urinary diversion to a neobladder. There is some wall thickening in the urinary conduit as it ties into the neobladder, but this is nonspecific. On previous studies, the wall to this structure was very thin and the wall thickening today makes inflammation a concern. There is also some new wall thickening associated with the right aspect of the neobladder. Stable tiny bilateral adrenal nodules, likely representing adenomas. Abdominal aortic atherosclerosis with 3.0 cm aneurysm, stable. Electronically Signed   By: Misty Stanley M.D.   On: 05/17/2015 23:32   Dg Abd 2 Views  05/19/2015  CLINICAL DATA:  Ventral hernia with obstruction EXAM: ABDOMEN - 2 VIEW COMPARISON:  05/18/2015 FINDINGS: NG tube has been removed. Again noted gaseous distended small bowel loops mid abdomen suspicious for ileus or partial bowel obstruction. Postcholecystectomy surgical clips. Moderate stool and gas noted in right colon and splenic flexure of the colon. Surgical clips are noted within pelvis. No evidence of free abdominal air. IMPRESSION: Again noted gaseous distended small bowel loops mid abdomen suspicious for ileus or partial bowel obstruction. Postcholecystectomy surgical clips. Moderate stool and gas noted in right colon and splenic flexure  of the colon. Electronically Signed   By: Lahoma Crocker M.D.   On: 05/19/2015 08:18   Dg Abd Portable 2v  05/18/2015  CLINICAL DATA:  Patient with generalized abdominal pain. Abdominal discomfort. EXAM: PORTABLE ABDOMEN - 2 VIEW COMPARISON:  CT abdomen pelvis 05/17/2015 FINDINGS: Enteric tube tip and side-port project over the stomach. Cholecystectomy clips. Lung bases are clear. There multiple prominent gaseous distended loops of small bowel within the central abdomen measuring up to 4.3 cm. Stool and gas demonstrated within the colon. Pelvic surgical clips. Lumbar spine degenerative changes. IMPRESSION: Enteric tube tip and side-port project over the stomach. Multiple gaseous distended loops of small bowel within the central abdomen concerning for early and/or partial bowel obstruction. Electronically Signed   By: Lovey Newcomer M.D.   On: 05/18/2015 11:19    EKG:   Orders placed or performed in visit on 07/27/13  . EKG 12-Lead    IMPRESSION AND PLAN:    Principal Problem:   Small bowel obstruction (HCC) Active Problems:   Incarcerated ventral hernia   Essential hypertension, malignant   Urinary retention   UTI (urinary tract infection)  #1. Small bowel obstruction due to incarcerated ventral hernia, management per surgery, decrease opiate frequency, continue IV fluids #2. COPD, no obvious exacerbation, continue patient on budesonide, tiotropium, albuterol inhalers, Advair, oxygen as needed. get ABGs to rule out CO2 retention as patient is  somnolent   #3. Urinary retention, Foley catheter was placed, good urinary output #4. Pyuria.  Initially suspected acute cystitis without hematuria, , but urinary cultures are negative so far , patient is not on antibiotic  #5, malignant essential hypertension. Continuene  Clonidine patch, labetalol as needed intravenously. #6. Dehydration. Improved  clinically with IV fluid administration 7 tobacco abuse counseling. Discussed this patient for approximately  3-4 minutes, refused a nicotine replacement #8. Altered mental status, rule out CO2 retention, getting ABGs       All the records are reviewed and case discussed with Consulting provider. Management plans discussed with the patient, family and they are in agreement.  CODStatus full code TOTAL TIME TAKING CARE OF THIS PAT50 minutes.    Theodoro Grist M.D on 05/19/2015 at 2:00 PM  Between 7am to 6pm - Pager - [phone]  After 6pm go to [email or web address] - password [email or web address] [facility]  [facility]  Office  [phone]  CC: Primary care Physician: Halina Maidens, MD

## 2015-05-19 NOTE — Progress Notes (Addendum)
Pt has removed NG tube. Pt is refusing to have NG tube reinserted. Dr. Adonis Huguenin notified of pts refusal. Pt continues to remain irritable and is noncompliant with CIWA questions. Pt is noncompliant with NPO orders and has gotten out of bed repeatedly and drinks water from the sink. Pt reports that his mouth is dry. Pt has been offered oral rinse and has refused reporting that he doesn't like it.

## 2015-05-20 ENCOUNTER — Inpatient Hospital Stay: Payer: Medicare PPO

## 2015-05-20 ENCOUNTER — Inpatient Hospital Stay: Payer: Medicare PPO | Admitting: Anesthesiology

## 2015-05-20 ENCOUNTER — Encounter
Admission: EM | Disposition: A | Discharge status: Skilled Nursing Facility | Payer: Self-pay | Source: Home / Self Care | Attending: General Surgery

## 2015-05-20 HISTORY — PX: BOWEL RESECTION: SHX1257

## 2015-05-20 HISTORY — PX: LAPAROTOMY: SHX154

## 2015-05-20 LAB — BASIC METABOLIC PANEL
Anion gap: 7 (ref 5–15)
BUN: 19 mg/dL (ref 6–20)
CO2: 22 mmol/L (ref 22–32)
Calcium: 8.4 mg/dL — ABNORMAL LOW (ref 8.9–10.3)
Chloride: 112 mmol/L — ABNORMAL HIGH (ref 101–111)
Creatinine, Ser: 0.96 mg/dL (ref 0.61–1.24)
GFR calc Af Amer: >60 mL/min (ref >60)
GFR calc non Af Amer: >60 mL/min (ref >60)
Glucose, Bld: 143 mg/dL — ABNORMAL HIGH (ref 65–99)
Potassium: 3.5 mmol/L (ref 3.5–5.1)
Sodium: 141 mmol/L (ref 135–145)

## 2015-05-20 LAB — BLOOD GAS, ARTERIAL
ACID-BASE DEFICIT: 0.8 mmol/L (ref 0.0–2.0)
Allens test (pass/fail): POSITIVE — AB
BICARBONATE: 26.2 meq/L (ref 21.0–28.0)
FIO2: 40
LHR: 14 {breaths}/min
MECHVT: 500 mL
O2 Saturation: 93.2 %
PATIENT TEMPERATURE: 37
PCO2 ART: 52 mmHg — AB (ref 32.0–48.0)
PH ART: 7.31 — AB (ref 7.350–7.450)
pO2, Arterial: 74 mmHg — ABNORMAL LOW (ref 83.0–108.0)

## 2015-05-20 LAB — CBC
HCT: 49.8 % (ref 40.0–52.0)
Hemoglobin: 16.7 g/dL (ref 13.0–18.0)
MCH: 31.9 pg (ref 26.0–34.0)
MCHC: 33.6 g/dL (ref 32.0–36.0)
MCV: 95 fL (ref 80.0–100.0)
PLATELETS: 151 10*3/uL (ref 150–440)
RBC: 5.24 MIL/uL (ref 4.40–5.90)
RDW: 13.6 % (ref 11.5–14.5)
WBC: 16 10*3/uL — AB (ref 3.8–10.6)

## 2015-05-20 LAB — MRSA PCR SCREENING: MRSA BY PCR: NEGATIVE

## 2015-05-20 LAB — GLUCOSE, CAPILLARY: GLUCOSE-CAPILLARY: 137 mg/dL — AB (ref 65–99)

## 2015-05-20 SURGERY — LAPAROTOMY, EXPLORATORY
Anesthesia: General | Site: Abdomen

## 2015-05-20 MED ORDER — PHENYLEPHRINE HCL 10 MG/ML IJ SOLN
INTRAMUSCULAR | Status: DC | PRN
  Administered 2015-05-20: 100 ug via INTRAVENOUS
  Administered 2015-05-20 (×2): 200 ug via INTRAVENOUS
  Administered 2015-05-20 (×2): 100 ug via INTRAVENOUS

## 2015-05-20 MED ORDER — ERTAPENEM SODIUM 1 G IJ SOLR
250.0000 mg | Freq: Once | INTRAMUSCULAR | Status: DC
  Filled 2015-05-20: qty 0.25

## 2015-05-20 MED ORDER — SUCCINYLCHOLINE CHLORIDE 20 MG/ML IJ SOLN
INTRAMUSCULAR | Status: DC | PRN
  Administered 2015-05-20: 140 mg via INTRAVENOUS

## 2015-05-20 MED ORDER — PHENYLEPHRINE HCL 10 MG/ML IJ SOLN
0.0000 ug/min | INTRAMUSCULAR | Status: DC
  Filled 2015-05-20: qty 1

## 2015-05-20 MED ORDER — FUROSEMIDE 10 MG/ML IJ SOLN
40.0000 mg | Freq: Once | INTRAMUSCULAR | Status: AC
  Administered 2015-05-20: 40 mg via INTRAVENOUS
  Filled 2015-05-20: qty 4

## 2015-05-20 MED ORDER — DEXAMETHASONE SODIUM PHOSPHATE 10 MG/ML IJ SOLN
INTRAMUSCULAR | Status: DC | PRN
  Administered 2015-05-20: 10 mg via INTRAVENOUS

## 2015-05-20 MED ORDER — PROPOFOL 10 MG/ML IV BOLUS
INTRAVENOUS | Status: DC | PRN
  Administered 2015-05-20: 100 mg via INTRAVENOUS

## 2015-05-20 MED ORDER — CHLORHEXIDINE GLUCONATE 0.12% ORAL RINSE (MEDLINE KIT)
15.0000 mL | Freq: Two times a day (BID) | OROMUCOSAL | Status: DC
  Administered 2015-05-20 – 2015-05-28 (×15): 15 mL via OROMUCOSAL
  Filled 2015-05-20 (×18): qty 15

## 2015-05-20 MED ORDER — LIDOCAINE HCL (CARDIAC) 20 MG/ML IV SOLN
INTRAVENOUS | Status: DC | PRN
  Administered 2015-05-20: 30 mg via INTRAVENOUS

## 2015-05-20 MED ORDER — MIDAZOLAM HCL 2 MG/2ML IJ SOLN
INTRAMUSCULAR | Status: DC | PRN
  Administered 2015-05-20 (×2): 2 mg via INTRAVENOUS
  Administered 2015-05-20: 3 mg via INTRAVENOUS

## 2015-05-20 MED ORDER — ACETAMINOPHEN 10 MG/ML IV SOLN
INTRAVENOUS | Status: AC
  Filled 2015-05-20: qty 100

## 2015-05-20 MED ORDER — FENTANYL CITRATE (PF) 100 MCG/2ML IJ SOLN
INTRAMUSCULAR | Status: DC | PRN
  Administered 2015-05-20 (×3): 100 ug via INTRAVENOUS
  Administered 2015-05-20 (×2): 50 ug via INTRAVENOUS
  Administered 2015-05-20: 100 ug via INTRAVENOUS

## 2015-05-20 MED ORDER — ERTAPENEM SODIUM 1 G IJ SOLR
1.0000 g | INTRAMUSCULAR | Status: DC
  Administered 2015-05-21: 1 g via INTRAVENOUS
  Filled 2015-05-20 (×2): qty 1

## 2015-05-20 MED ORDER — ACETAMINOPHEN 10 MG/ML IV SOLN
INTRAVENOUS | Status: DC | PRN
  Administered 2015-05-20: 1000 mg via INTRAVENOUS

## 2015-05-20 MED ORDER — ONDANSETRON HCL 4 MG/2ML IJ SOLN
INTRAMUSCULAR | Status: DC | PRN
  Administered 2015-05-20: 4 mg via INTRAVENOUS

## 2015-05-20 MED ORDER — SODIUM CHLORIDE 0.9 % IV SOLN
1.0000 g | Freq: Once | INTRAVENOUS | Status: DC
  Filled 2015-05-20: qty 1

## 2015-05-20 MED ORDER — DEXMEDETOMIDINE HCL IN NACL 200 MCG/50ML IV SOLN
INTRAVENOUS | Status: DC | PRN
  Administered 2015-05-20: 1 ug/kg/h via INTRAVENOUS

## 2015-05-20 MED ORDER — SODIUM CHLORIDE 0.9 % IV SOLN
1.0000 g | INTRAVENOUS | Status: DC | PRN
  Administered 2015-05-20: 1 g via INTRAVENOUS

## 2015-05-20 MED ORDER — ROCURONIUM BROMIDE 100 MG/10ML IV SOLN
INTRAVENOUS | Status: DC | PRN
  Administered 2015-05-20 (×2): 50 mg via INTRAVENOUS

## 2015-05-20 MED ORDER — CEFAZOLIN SODIUM 1 G IJ SOLR
INTRAMUSCULAR | Status: AC
  Filled 2015-05-20: qty 20

## 2015-05-20 MED ORDER — DIAZEPAM 5 MG/ML IJ SOLN
5.0000 mg | Freq: Once | INTRAMUSCULAR | Status: AC
Start: 2015-05-20 — End: 2015-05-20
  Administered 2015-05-20: 5 mg via INTRAVENOUS
  Filled 2015-05-20: qty 2

## 2015-05-20 MED ORDER — CEFAZOLIN SODIUM-DEXTROSE 2-3 GM-% IV SOLR
INTRAVENOUS | Status: DC | PRN
  Administered 2015-05-20: 2 g via INTRAVENOUS

## 2015-05-20 MED ORDER — DEXMEDETOMIDINE HCL IN NACL 400 MCG/100ML IV SOLN
0.4000 ug/kg/h | INTRAVENOUS | Status: DC
  Administered 2015-05-20: 0.4 ug/kg/h via INTRAVENOUS
  Administered 2015-05-21: 1.2 ug/kg/h via INTRAVENOUS
  Filled 2015-05-20 (×3): qty 100

## 2015-05-20 MED ORDER — ANTISEPTIC ORAL RINSE SOLUTION (CORINZ)
7.0000 mL | Freq: Four times a day (QID) | OROMUCOSAL | Status: DC
  Administered 2015-05-21 – 2015-05-29 (×34): 7 mL via OROMUCOSAL
  Filled 2015-05-20 (×38): qty 7

## 2015-05-20 MED ORDER — LACTATED RINGERS IV SOLN
INTRAVENOUS | Status: DC | PRN
  Administered 2015-05-20: 13:00:00 via INTRAVENOUS

## 2015-05-20 MED ORDER — PHENYLEPHRINE HCL 10 MG/ML IJ SOLN
20.0000 mg | INTRAVENOUS | Status: DC | PRN
  Administered 2015-05-20: 15 ug/min via INTRAVENOUS

## 2015-05-20 SURGICAL SUPPLY — 43 items
BULB RESERV EVAC DRAIN JP 100C (MISCELLANEOUS) ×6 IMPLANT
CANISTER SUCT 1200ML W/VALVE (MISCELLANEOUS) IMPLANT
CANISTER SUCT 3000ML (MISCELLANEOUS) ×3 IMPLANT
CATH TRAY 16F METER LATEX (MISCELLANEOUS) IMPLANT
CHLORAPREP W/TINT 26ML (MISCELLANEOUS) ×3 IMPLANT
DRAIN CHANNEL JP 15F RND 16 (MISCELLANEOUS) IMPLANT
DRAIN CHANNEL JP 19F (MISCELLANEOUS) ×6 IMPLANT
DRAPE LAPAROTOMY 100X77 ABD (DRAPES) ×3 IMPLANT
DRSG OPSITE POSTOP 4X12 (GAUZE/BANDAGES/DRESSINGS) ×3 IMPLANT
DRSG OPSITE POSTOP 4X8 (GAUZE/BANDAGES/DRESSINGS) IMPLANT
ELECT CAUTERY BLADE 6.4 (BLADE) ×3 IMPLANT
ELECT REM PT RETURN 9FT ADLT (ELECTROSURGICAL) ×3
ELECTRODE REM PT RTRN 9FT ADLT (ELECTROSURGICAL) ×2 IMPLANT
GAUZE SPONGE 4X4 12PLY STRL (GAUZE/BANDAGES/DRESSINGS) ×3 IMPLANT
GLOVE BIO SURGEON STRL SZ7.5 (GLOVE) ×12 IMPLANT
GLOVE INDICATOR 8.0 STRL GRN (GLOVE) ×15 IMPLANT
GOWN STRL REUS W/ TWL LRG LVL3 (GOWN DISPOSABLE) ×4 IMPLANT
GOWN STRL REUS W/TWL LRG LVL3 (GOWN DISPOSABLE) ×2
KIT RM TURNOVER STRD PROC AR (KITS) ×6 IMPLANT
LABEL OR SOLS (LABEL) ×3 IMPLANT
LIGASURE IMPACT 36 18CM CVD LR (INSTRUMENTS) ×3 IMPLANT
MESH BIO-A  10X30 SYN MA ×1 IMPLANT
MESH BIO-A 10X30 SYN MA ×2 IMPLANT
NS IRRIG 1000ML POUR BTL (IV SOLUTION) ×9 IMPLANT
PACK BASIN MAJOR ARMC (MISCELLANEOUS) ×3 IMPLANT
PACK COLON CLEAN CLOSURE (MISCELLANEOUS) ×3 IMPLANT
RELOAD PROXIMATE 75MM BLUE (ENDOMECHANICALS) ×9 IMPLANT
SHEARS HARMONIC STRL 23CM (MISCELLANEOUS) IMPLANT
SPONGE DRAIN TRACH 4X4 STRL 2S (GAUZE/BANDAGES/DRESSINGS) ×3 IMPLANT
SPONGE LAP 18X18 5 PK (GAUZE/BANDAGES/DRESSINGS) ×3 IMPLANT
STAPLER PROXIMATE 75MM BLUE (STAPLE) ×3 IMPLANT
STAPLER SKIN PROX 35W (STAPLE) ×3 IMPLANT
SUT ETHIBOND 0 MO6 C/R (SUTURE) ×3 IMPLANT
SUT ETHILON 3-0 KS 30 BLK (SUTURE) ×6 IMPLANT
SUT PDS AB 1 TP1 96 (SUTURE) ×3 IMPLANT
SUT SILK 2 0 SH (SUTURE) ×3 IMPLANT
SUT SILK 3 0 (SUTURE) ×1
SUT SILK 3-0 (SUTURE) ×1
SUT SILK 3-0 18XBRD TIE 12 (SUTURE) ×2 IMPLANT
SUT SILK 3-0 SH-1 18XCR BRD (SUTURE) ×2
SUT VIC AB 3-0 SH 27 (SUTURE) ×1
SUT VIC AB 3-0 SH 27X BRD (SUTURE) ×2 IMPLANT
SUTURE SILK 3-0 SH-1 18XCR BRD (SUTURE) ×2 IMPLANT

## 2015-05-20 NOTE — Progress Notes (Signed)
Pt is starting to become more agitated,  PRN ativan and schedule ativan has been given to pt throughout the shift. Pt. Is beginning to pull at foley catheter and IV line, and is trying to get out of bed. Safety mitts were placed with safety sitter at bedside. Doctor Sudini was notified and made aware of pt behavior, new orders for Valium IV 5 mg once. Will continue to monitor pt.  Angus Seller

## 2015-05-20 NOTE — Anesthesia Preprocedure Evaluation (Signed)
Anesthesia Evaluation  Patient identified by MRN, date of birth, ID band Patient confused    Reviewed: Allergy & Precautions, Patient's Chart, lab work & pertinent test results, Unable to perform ROS - Chart review only  Airway Mallampati: III       Dental   Pulmonary COPD,  oxygen dependent, Current Smoker,    + rhonchi        Cardiovascular hypertension, Pt. on medications      Neuro/Psych Anxiety    GI/Hepatic negative GI ROS, Neg liver ROS,   Endo/Other  negative endocrine ROS  Renal/GU negative Renal ROS     Musculoskeletal   Abdominal   Peds  Hematology negative hematology ROS (+)   Anesthesia Other Findings Pt confused and combative, unable to communicate adequately.  Reproductive/Obstetrics                             Anesthesia Physical Anesthesia Plan  ASA: III and emergent  Anesthesia Plan: General   Post-op Pain Management:    Induction: Intravenous and Rapid sequence  Airway Management Planned: Oral ETT  Additional Equipment:   Intra-op Plan:   Post-operative Plan:   Informed Consent:   Plan Discussed with:   Anesthesia Plan Comments: (Unable to get consent secondary to patient's mental status.)        Anesthesia Quick Evaluation

## 2015-05-20 NOTE — Brief Op Note (Signed)
05/17/2015 - 05/20/2015  4:37 PM  PATIENT:  Brady Smith  70 y.o. male  PRE-OPERATIVE DIAGNOSIS:  chronically incarcerated ventral hernia, bowel obstruction,  POST-OPERATIVE DIAGNOSIS:  Strangulated central ventral hernia in middle of chronically incarcerated ventral hernia  PROCEDURE:  Procedure(s): EXPLORATORY LAPAROTOMY (N/A) SMALL BOWEL RESECTION  SURGEON:  Surgeon(s) and Role:    * Clayburn Pert, MD - Primary  PHYSICIAN ASSISTANT:   ASSISTANTS: none   ANESTHESIA:   general  EBL:  Total I/O In: 4000 [I.V.:4000] Out: 1850 [Urine:1150; Other:500; Blood:200]  BLOOD ADMINISTERED:none  DRAINS: (9fr) Blake drain(s) in the right and left subcutaneous flaps   LOCAL MEDICATIONS USED:  NONE  SPECIMEN:  Source of Specimen:  small bowel  DISPOSITION OF SPECIMEN:  PATHOLOGY  COUNTS:  YES  TOURNIQUET:  * No tourniquets in log *  DICTATION: .Dragon Dictation  PLAN OF CARE: transfer to ICU  PATIENT DISPOSITION:  ICU - intubated and hemodynamically stable.   Delay start of Pharmacological VTE agent (>24hrs) due to surgical blood loss or risk of bleeding: no

## 2015-05-20 NOTE — Progress Notes (Signed)
  Finally able to reach patient's sister via phone. She agrees to proceed with surgery and she will come to the hospital. Phone number for mobile phone in system is correct.  Clayburn Pert, MD FACS General Surgeon [facility]

## 2015-05-20 NOTE — Transfer of Care (Signed)
Immediate Anesthesia Transfer of Care Note  Patient: Brady Smith  Procedure(s) Performed: Procedure(s): EXPLORATORY LAPAROTOMY (N/A) SMALL BOWEL RESECTION  Patient Location: PACU and ICU  Anesthesia Type:General  Level of Consciousness: sedated  Airway & Oxygen Therapy: Patient remains intubated per anesthesia plan and Patient placed on Ventilator (see vital sign flow sheet for setting)  Post-op Assessment: Report given to RN and Post -op Vital signs reviewed and stable  Post vital signs: Reviewed  Last Vitals:  Filed Vitals:   05/20/15 0227 05/20/15 1700  BP: 152/91 135/65  Pulse: 67 55  Temp:  36.6 C  Resp:  12    Complications: No apparent anesthesia complications

## 2015-05-20 NOTE — Progress Notes (Signed)
CC: Hernia Subjective: Patient lethargic and barely responsive on exam today. Per nursing he had just received when necessary medications for agitation. He has been afebrile and combative throughout the night.  Objective: Vital signs in last 24 hours: Temp:  [98.6 F (37 C)-98.9 F (37.2 C)] 98.9 F (37.2 C) (02/24 2114) Pulse Rate:  [67-117] 67 (02/25 0227) Resp:  [20-22] 20 (02/24 2114) BP: (152-161)/(83-94) 152/91 mmHg (02/25 0227) SpO2:  [87 %-97 %] 94 % (02/25 0715)    Intake/Output from previous day: 02/24 0701 - 02/25 0700 In: 2581 [I.V.:2581] Out: 2500 [Urine:2500] Intake/Output this shift: Total I/O In: 0  Out: 1000 [Urine:1000]  Physical exam:  Gen. laying in bed obviously medicated Chest: Clear to auscultation Heart: Regular rate and rhythm Out: Midline hernia is hard on palpation, hot, erythematous and blanching. Abdomen away from the hernia is soft but the hernia itself is hard  Lab Results: CBC   Recent Labs  05/19/15 0719 05/20/15 0556  WBC 14.3* 16.0*  HGB 16.4 16.7  HCT 48.5 49.8  PLT 155 151   BMET  Recent Labs  05/19/15 0719 05/20/15 0556  NA 141 141  K 4.3 3.5  CL 110 112*  CO2 26 22  GLUCOSE 186* 143*  BUN 14 19  CREATININE 0.90 0.96  CALCIUM 8.4* 8.4*   PT/INR  Recent Labs  05/17/15 2151 05/18/15 0423  LABPROT 13.5 14.7  INR 1.01 1.13   ABG  Recent Labs  05/19/15 0915  PHART 7.47*  HCO3 23.3    Studies/Results: Dg Chest Port 1 View  05/20/2015  CLINICAL DATA:  Acute onset of cough.  Initial encounter. EXAM: PORTABLE CHEST 1 VIEW COMPARISON:  Chest radiograph from 07/14/2014 FINDINGS: The lungs are well-aerated. Peribronchial thickening is noted. Mild bibasilar opacities may reflect mild interstitial edema or possibly pneumonia. There is no evidence of pleural effusion or pneumothorax. The cardiomediastinal silhouette is within normal limits. No acute osseous abnormalities are seen. IMPRESSION: Peribronchial  thickening noted. Mild bibasilar airspace opacities may reflect mild interstitial edema or possibly pneumonia. Electronically Signed   By: Garald Balding M.D.   On: 05/20/2015 05:00   Dg Abd 2 Views  05/20/2015  CLINICAL DATA:  70 year old male -followup small bowel obstruction. EXAM: ABDOMEN - 2 VIEW COMPARISON:  05/19/2015 and prior radiographs.  05/17/2015 CT FINDINGS: Dilated small bowel loops within the abdomen again noted. Gas and stool in the colon again noted. Cholecystectomy clips again identified. IMPRESSION: Little significant change in dilated small bowel loops. Electronically Signed   By: Margarette Canada M.D.   On: 05/20/2015 09:59   Dg Abd 2 Views  05/19/2015  CLINICAL DATA:  Ventral hernia with obstruction EXAM: ABDOMEN - 2 VIEW COMPARISON:  05/18/2015 FINDINGS: NG tube has been removed. Again noted gaseous distended small bowel loops mid abdomen suspicious for ileus or partial bowel obstruction. Postcholecystectomy surgical clips. Moderate stool and gas noted in right colon and splenic flexure of the colon. Surgical clips are noted within pelvis. No evidence of free abdominal air. IMPRESSION: Again noted gaseous distended small bowel loops mid abdomen suspicious for ileus or partial bowel obstruction. Postcholecystectomy surgical clips. Moderate stool and gas noted in right colon and splenic flexure of the colon. Electronically Signed   By: Lahoma Crocker M.D.   On: 05/19/2015 08:18    Anti-infectives: Anti-infectives    None      Assessment/Plan:  70 year old male admitted with was thought to be a partial small bowel obstruction and a chronically incarcerated ventral  hernia. Hernia today is hardware before the been soft. Is also showing signs of possible bowel ischemia with blanching erythema and rubor. Given this in the setting of increasing delirium and increasing leukocytosis I'm concerned for the viability of the bowel trapped within the hernia. Patient is not lucid and cannot consent  for himself and his emergency contact cannot be obtained as the phone number listed for emergency contact rings the patient's cell phone in his room. Given this the patient is requiring emergent surgery to relieve his obstruction hopefully before he loses any bowel viability. Given the patient's current delirium likely secondary to delirium tremens plan to keep him intubated postoperatively. Patient also with a poor pulmonary history will likely be very difficult to extubate. We'll proceed to the operating room emergently.  Makella Buckingham T. Adonis Huguenin, MD, FACS  05/20/2015

## 2015-05-20 NOTE — Anesthesia Procedure Notes (Signed)
Procedure Name: Intubation Date/Time: 05/20/2015 1:35 PM Performed by: Sinda Du Pre-anesthesia Checklist: Patient identified, Patient being monitored, Timeout performed, Emergency Drugs available and Suction available Patient Re-evaluated:Patient Re-evaluated prior to inductionOxygen Delivery Method: Circle system utilized Preoxygenation: Pre-oxygenation with 100% oxygen Intubation Type: IV induction, Rapid sequence and Cricoid Pressure applied Ventilation: Mask ventilation with difficulty Laryngoscope Size: Mac, 3 and Glidescope Grade View: Grade I Tube type: Oral Tube size: 7.0 mm Number of attempts: 2 Airway Equipment and Method: Stylet and Video-laryngoscopy Placement Confirmation: ETT inserted through vocal cords under direct vision,  positive ETCO2 and breath sounds checked- equal and bilateral Secured at: 21 cm Tube secured with: Tape Dental Injury: Teeth and Oropharynx as per pre-operative assessment  Comments: Difficulty due to large dried mucous plugs throughtout oral pharynx and obstructing cords.  Better visualization with glidescope.

## 2015-05-20 NOTE — Progress Notes (Signed)
Spoke to Dr. Earleen Newport- Patient seems unresponsive due to sedation received from OR procedure- will wean off precedex drip at tolerated.

## 2015-05-20 NOTE — Progress Notes (Addendum)
Upon of reassessment pt.'s breath sounds are course and a more rapid breathing instead of clear and diminished breath sound on earlier assessment. Doctor Marcille Blanco notified of assessment, new orders to give lasix IV 40mg  once, hold fluids, and a chest port view. Will continue to monitor pt.  Brady Smith

## 2015-05-20 NOTE — Progress Notes (Signed)
Patient ID: Brady Smith, male   DOB: 06-08-1945, 70 y.o.   MRN: [id number] [facility] PROGRESS NOTE  Brady Smith [id number] DOB: Aug 11, 1945 DOA: 05/17/2015 PCP: Halina Maidens, MD  HPI/Subjective: I was asked too see patient post-operatively by Dr Verdell Carmine. Patient intubated and sedated.   Objective: Filed Vitals:   05/20/15 0227 05/20/15 1700  BP: 152/91 135/65  Pulse: 67 55  Temp:  97.8 F (36.6 C)  Resp:  12    Filed Weights   05/18/15 0339 05/18/15 0723 05/20/15 1700  Weight: 78.926 kg (174 lb) 78.971 kg (174 lb 1.6 oz) 76.749 kg (169 lb 3.2 oz)    ROS: Review of Systems  Unable to perform ROS Patient intubated and sedated Exam: Physical Exam  Constitutional: He is sedated and intubated.  HENT:  Nose: No mucosal edema.  Eyes:  Pupils pinpoint  Neck: Neck supple. Carotid bruit is not present.  Cardiovascular: Regular rhythm, S1 normal and S2 normal.   Pulses:      Dorsalis pedis pulses are 1+ on the right side, and 1+ on the left side.  Respiratory: No accessory muscle usage. He is intubated. He has no decreased breath sounds. He has no wheezes. He has no rhonchi. He has no rales.  GI: Soft. Bowel sounds are decreased. There is generalized tenderness.  Musculoskeletal:       Right ankle: He exhibits swelling.       Left ankle: He exhibits swelling.  Lymphadenopathy:    He has no cervical adenopathy.  Neurological:  Sedated, unable to arouse at this point  Skin:  2 abdominal drains seen  Psychiatric:  sedated      Data Reviewed: Basic Metabolic Panel:  Recent Labs Lab 05/17/15 2151 05/18/15 0423 05/19/15 0719 05/20/15 0556  NA 140 140 141 141  K 4.1 3.9 4.3 3.5  CL 104 106 110 112*  CO2 27 25 26 22   GLUCOSE 125* 110* 186* 143*  BUN 11 11 14 19   CREATININE 0.86 0.91 0.90 0.96  CALCIUM 9.3 8.7* 8.4* 8.4*  MG  --  2.0  --   --   PHOS  --  4.2  --   --    Liver Function Tests:  Recent Labs Lab 05/17/15 2151 05/18/15 0423   AST 19 13*  ALT 16* 13*  ALKPHOS 58 55  BILITOT 1.1 1.0  PROT 7.5 6.8  ALBUMIN 4.1 3.8   CBC:  Recent Labs Lab 05/17/15 2151 05/18/15 0423 05/19/15 0719 05/20/15 0556  WBC 8.9 9.1 14.3* 16.0*  NEUTROABS  --   --  12.8*  --   HGB 18.4* 17.5 16.4 16.7  HCT 55.2* 52.0 48.5 49.8  MCV 95.1 95.0 94.5 95.0  PLT 185 172 155 151    CBG:  Recent Labs Lab 05/19/15 0747 05/19/15 1141 05/19/15 1703  GLUCAP 164* 198* 174*    Recent Results (from the past 240 hour(s))  Urine culture     Status: None   Collection Time: 05/18/15  9:21 AM  Result Value Ref Range Status   Specimen Description URINE, CATHETERIZED  Final   Special Requests Normal  Final   Culture NO GROWTH 1 DAY  Final   Report Status 05/19/2015 FINAL  Final     Studies: Dg Chest Port 1 View  05/20/2015  CLINICAL DATA:  Acute onset of cough.  Initial encounter. EXAM: PORTABLE CHEST 1 VIEW COMPARISON:  Chest radiograph from 07/14/2014 FINDINGS: The lungs are well-aerated. Peribronchial thickening is noted. Mild  bibasilar opacities may reflect mild interstitial edema or possibly pneumonia. There is no evidence of pleural effusion or pneumothorax. The cardiomediastinal silhouette is within normal limits. No acute osseous abnormalities are seen. IMPRESSION: Peribronchial thickening noted. Mild bibasilar airspace opacities may reflect mild interstitial edema or possibly pneumonia. Electronically Signed   By: Garald Balding M.D.   On: 05/20/2015 05:00   Dg Abd 2 Views  05/20/2015  CLINICAL DATA:  70 year old male -followup small bowel obstruction. EXAM: ABDOMEN - 2 VIEW COMPARISON:  05/19/2015 and prior radiographs.  05/17/2015 CT FINDINGS: Dilated small bowel loops within the abdomen again noted. Gas and stool in the colon again noted. Cholecystectomy clips again identified. IMPRESSION: Little significant change in dilated small bowel loops. Electronically Signed   By: Margarette Canada M.D.   On: 05/20/2015 09:59   Dg Abd 2  Views  05/19/2015  CLINICAL DATA:  Ventral hernia with obstruction EXAM: ABDOMEN - 2 VIEW COMPARISON:  05/18/2015 FINDINGS: NG tube has been removed. Again noted gaseous distended small bowel loops mid abdomen suspicious for ileus or partial bowel obstruction. Postcholecystectomy surgical clips. Moderate stool and gas noted in right colon and splenic flexure of the colon. Surgical clips are noted within pelvis. No evidence of free abdominal air. IMPRESSION: Again noted gaseous distended small bowel loops mid abdomen suspicious for ileus or partial bowel obstruction. Postcholecystectomy surgical clips. Moderate stool and gas noted in right colon and splenic flexure of the colon. Electronically Signed   By: Lahoma Crocker M.D.   On: 05/19/2015 08:18    Scheduled Meds: . albuterol  3 mL Inhalation Q4H  . [START ON 05/21/2015] antiseptic oral rinse  7 mL Mouth Rinse QID  . budesonide (PULMICORT) nebulizer solution  0.25 mg Nebulization BID  . chlorhexidine gluconate  15 mL Mouth Rinse BID  . cloNIDine  0.3 mg Transdermal Weekly  . enoxaparin (LOVENOX) injection  40 mg Subcutaneous Q24H  . ertapenem (INVANZ) IV  250 mg Intravenous Once  . ertapenem (INVANZ) IV  1 g Intravenous Once  . folic acid  1 mg Oral Daily  . LORazepam  0-4 mg Intravenous Q12H  . multivitamin with minerals  1 tablet Oral Daily  . nicotine  21 mg Transdermal Daily  . pantoprazole (PROTONIX) IV  40 mg Intravenous QHS  . thiamine  100 mg Oral Daily   Or  . thiamine  100 mg Intravenous Daily  . tiotropium  18 mcg Inhalation Daily   Continuous Infusions: . dexmedetomidine    . lactated ringers 1 mL (05/20/15 0046)    Assessment/Plan:  1. Post- operative shock.  Patient on Phenylepherine and ivf.  Clonidine patch already removed as per nursing staff 2. Alcohol withdrawal- surgery would like to keep on ventilator through the weekend. Continue precedex drip. Prn ativan. 3. Acute respiratory failure with hypoxia.  Hx copd.  Continue ventilator at current settings. 4. Hx copd- contniue nebulizers 5. Small bowel obstruction with necrotic bowel. S/p surgery 6. Urinary retension- foley catheter placed.  Will need flomax as soon as we can order after pressors stopped  Code Status:     Code Status Orders        Start     Ordered   05/18/15 0352  Full code   Continuous     05/18/15 0351    Code Status History    Date Active Date Inactive Code Status Order ID Comments User Context   This patient has a current code status but no historical code status.  Advance Directive Documentation        Most Recent Value   Type of Advance Directive  Healthcare Power of Attorney   Pre-existing out of facility DNR order (yellow form or pink MOST form)     "MOST" Form in Place?       Disposition Plan: TBD  Consultants:  Surgery  Antibiotics:  invanz  Time spent: 32 minutes critical care time  Loletha Grayer  [facility]

## 2015-05-20 NOTE — Op Note (Signed)
Pre-operative Diagnosis: strangulated ventral hernia  Post-operative Diagnosis: Strangulated multiloculated ventral hernia with necrotic loops of small bowel  Surgeon: Clayburn Pert   Assistants: None  Anesthesia: General endotracheal anesthesia  ASA Class: 3  Surgeon: Clayburn Pert, MD FACS  Anesthesia: Gen. with endotracheal tube  Assistant: None  Procedure Details  The patient was seen again in the Holding Room. The benefits, complications, treatment options, and expected outcomes were discussed with the patient. The risks of bleeding, infection, recurrence of symptoms, failure to resolve symptoms,  bowel injury, any of which could require further surgery were reviewed with the patient's sister over the phone.   The patient was taken to Operating Room, identified as Brady Smith and the procedure verified.  A Time Out was held and the above information confirmed.  Prior to the induction of general anesthesia, antibiotic prophylaxis was administered. VTE prophylaxis was in place. General endotracheal anesthesia was then administered and tolerated well. After the induction, the abdomen was prepped with Chloraprep and draped in the sterile fashion. The patient was positioned in the supine position.  The patient's large midline hernia was again noted to be hard and hot. A midline incision was made cephalad to this where the fascia was able to be identified free from the area of incarceration. This was gradually taking caudad to the hernia. Upon reaching the area of the hernia the immediate odor of necrotic bowel was appreciated. Meticulous dissection was required to identify the multiloculated hernia as there were multiple fascial defects encountered. The largest area that also contained the area of necrotic bowel was sharply entered into releasing the fascia that had the bowel trapped. The hernia sacs were bluntly dissected out from the superficial attachments and made hemostatic with  direct electrocautery. This dissection was very tedious and care was taken to not injure the bowel in all directions.  Once the hernia contents were freed from all other superficial attachments and from the fascia the numerous areas of bowel to bowel adhesions were also identified and sharply lysed with Metzenbaum scissors. The area of necrotic bowel was noted to be contiguous with approximately 20 cm of bowel necrosis. This section of bowel was then excised using a 75 mm blue load GIA stapler. The mesentery was excised using a large open jawed LigaSure device. The necrotic loops of small bowel were passed off the field without any enteric spillage. Adequate approximation of the proximal and distal small bowel were able be accomplished and stay sutures were placed of 3-0 silk suture. The bowel was then opened up sharply on both sides and antimesenteric staple anastomosis was made with additional fire of the 75 mm blue load stapler. The enterotomy was closed also with the 75 load blue load stapler without any difficulty. There was a patulous anastomosis at the completion of this procedure. The area of small bowel was then returned into the patient's abdomen and the fascia was freed off circumferentially. Additional fascial defects were noted cephalad upon doing so. These were also dissected back into the abdomen and freed off circumferentially.  Once the fascia was freed in all directions attention was then turned to the prior hernia sac. There was a necrotic area on the patient's left side which was dissected free to healthy tissue. This was noted to be overlying a synthetic mesh that appeared to be in the prefascial plane. The entirety of the synthetic mesh was then dissected free in all directions and appeared to be circumferential around the prior hernia. This large  mesh dissection created a large superficial planes in all directions from the surgical site. We then copiously irrigated both the abdomen and the  superficial tissues until her irrigation returned clear.  Once this was done our fascial defect was again investigated and noted be a little return to midline with Coker forceps. The actual fascial defect was noted to be approximately 18 cm from top to bottom. At this point a 30 x 10 cm piece of Bio-A bio-synthetic mesh was brought to the field and able be placed in the abdomen without difficult. It was secured circumferentially in a subfascial plane with interrupted 0 Ethibond sutures. This provided good coverage of the entirety of the visible fascial defect.   At this point we proceeded to go for a clean closure. Midline fascia was then able to be reapproximated with interrupted figure-of-eight sutures with #1 PDS. Upon closure of the fascia two 34 [place] Blake drains were brought up to the field and placed in the bilateral dissected planes from the prior mesh removal. The skin was then able be reapproximated with staples and covered with a honeycomb dressing. The drains were held in place with 3-0 nylon sutures and drain sponges were placed over these. The drains were then hooked to bulb suction.  All counts are correct of the of the procedure and there were no immediate complications. Given the patient's multiple medical problems the decision was made not to x-ray in the operating room and transferred patient to the ICU intubated and sedated.  Findings: Large midline incisional hernia with necrotic small bowel   Estimated Blood Loss: 100 mL         Drains: Bilateral 19 French Blake drains         Specimens: Small bowel          Complications: None                  Condition: Intubated and sedated to the ICU   Clayburn Pert, MD, FACS

## 2015-05-21 ENCOUNTER — Inpatient Hospital Stay: Payer: Medicare PPO

## 2015-05-21 DIAGNOSIS — F1010 Alcohol abuse, uncomplicated: Secondary | ICD-10-CM

## 2015-05-21 DIAGNOSIS — Z720 Tobacco use: Secondary | ICD-10-CM

## 2015-05-21 DIAGNOSIS — J9600 Acute respiratory failure, unspecified whether with hypoxia or hypercapnia: Secondary | ICD-10-CM

## 2015-05-21 DIAGNOSIS — Z9889 Other specified postprocedural states: Secondary | ICD-10-CM

## 2015-05-21 LAB — BASIC METABOLIC PANEL
ANION GAP: 7 (ref 5–15)
BUN: 30 mg/dL — ABNORMAL HIGH (ref 6–20)
CHLORIDE: 115 mmol/L — AB (ref 101–111)
CO2: 23 mmol/L (ref 22–32)
CREATININE: 1.12 mg/dL (ref 0.61–1.24)
Calcium: 7.9 mg/dL — ABNORMAL LOW (ref 8.9–10.3)
GFR calc non Af Amer: >60 mL/min (ref >60)
Glucose, Bld: 142 mg/dL — ABNORMAL HIGH (ref 65–99)
POTASSIUM: 3.6 mmol/L (ref 3.5–5.1)
SODIUM: 145 mmol/L (ref 135–145)

## 2015-05-21 LAB — PHOSPHORUS: PHOSPHORUS: 3.2 mg/dL (ref 2.5–4.6)

## 2015-05-21 LAB — CBC
HCT: 44.7 % (ref 40.0–52.0)
HEMOGLOBIN: 14.9 g/dL (ref 13.0–18.0)
MCH: 32.1 pg (ref 26.0–34.0)
MCHC: 33.3 g/dL (ref 32.0–36.0)
MCV: 96.3 fL (ref 80.0–100.0)
Platelets: 139 10*3/uL — ABNORMAL LOW (ref 150–440)
RBC: 4.64 MIL/uL (ref 4.40–5.90)
RDW: 13.6 % (ref 11.5–14.5)
WBC: 6.1 10*3/uL (ref 3.8–10.6)

## 2015-05-21 LAB — MAGNESIUM: MAGNESIUM: 2.2 mg/dL (ref 1.7–2.4)

## 2015-05-21 MED ORDER — DEXMEDETOMIDINE HCL IN NACL 400 MCG/100ML IV SOLN
0.0000 ug/kg/h | INTRAVENOUS | Status: AC
  Administered 2015-05-21: 1.252 ug/kg/h via INTRAVENOUS
  Administered 2015-05-21: 1 ug/kg/h via INTRAVENOUS
  Administered 2015-05-21: 0.7 ug/kg/h via INTRAVENOUS
  Administered 2015-05-22: 1.2 ug/kg/h via INTRAVENOUS
  Administered 2015-05-22: 0.5 ug/kg/h via INTRAVENOUS
  Filled 2015-05-21 (×7): qty 100

## 2015-05-21 MED ORDER — LORAZEPAM 2 MG/ML IJ SOLN
0.5000 mg | INTRAMUSCULAR | Status: DC | PRN
  Administered 2015-05-22 – 2015-05-29 (×10): 1 mg via INTRAVENOUS
  Filled 2015-05-21 (×10): qty 1

## 2015-05-21 MED ORDER — HYDRALAZINE HCL 20 MG/ML IJ SOLN: 10.0000 mg | INTRAMUSCULAR | Status: DC | PRN

## 2015-05-21 MED ORDER — IPRATROPIUM-ALBUTEROL 0.5-2.5 (3) MG/3ML IN SOLN
3.0000 mL | Freq: Four times a day (QID) | RESPIRATORY_TRACT | Status: DC
  Administered 2015-05-21 – 2015-06-05 (×60): 3 mL via RESPIRATORY_TRACT
  Filled 2015-05-21 (×58): qty 3

## 2015-05-21 MED ORDER — ALBUTEROL SULFATE (2.5 MG/3ML) 0.083% IN NEBU
2.5000 mg | INHALATION_SOLUTION | RESPIRATORY_TRACT | Status: DC | PRN
  Administered 2015-05-30: 2.5 mg via RESPIRATORY_TRACT
  Filled 2015-05-21: qty 3

## 2015-05-21 MED ORDER — METOPROLOL TARTRATE 1 MG/ML IV SOLN
2.5000 mg | INTRAVENOUS | Status: DC | PRN
Start: 2015-05-21 — End: 2015-05-29
  Administered 2015-05-23: 5 mg via INTRAVENOUS
  Filled 2015-05-21: qty 5

## 2015-05-21 MED ORDER — ENOXAPARIN SODIUM 40 MG/0.4ML ~~LOC~~ SOLN
40.0000 mg | SUBCUTANEOUS | Status: DC
  Administered 2015-05-21 – 2015-05-29 (×9): 40 mg via SUBCUTANEOUS
  Filled 2015-05-21 (×9): qty 0.4

## 2015-05-21 MED ORDER — DEXMEDETOMIDINE HCL IN NACL 400 MCG/100ML IV SOLN
0.2000 ug/kg/h | INTRAVENOUS | Status: DC
  Filled 2015-05-21: qty 50

## 2015-05-21 MED ORDER — FENTANYL CITRATE (PF) 100 MCG/2ML IJ SOLN
50.0000 ug | INTRAMUSCULAR | Status: DC | PRN
  Administered 2015-05-23 – 2015-05-27 (×2): 50 ug via INTRAVENOUS

## 2015-05-21 MED ORDER — FENTANYL CITRATE (PF) 100 MCG/2ML IJ SOLN: 50.0000 ug | INTRAMUSCULAR | Status: DC | PRN

## 2015-05-21 MED ORDER — FENTANYL 2500MCG IN NS 250ML (10MCG/ML) PREMIX INFUSION
0.0000 ug/h | INTRAVENOUS | Status: DC
  Administered 2015-05-21 – 2015-05-22 (×2): 50 ug/h via INTRAVENOUS
  Administered 2015-05-23 – 2015-05-24 (×3): 200 ug/h via INTRAVENOUS
  Administered 2015-05-25: 50 ug/h via INTRAVENOUS
  Filled 2015-05-21 (×7): qty 250

## 2015-05-21 MED ORDER — LORAZEPAM 2 MG/ML IJ SOLN: 1.0000 mg | INTRAMUSCULAR | Status: DC | PRN

## 2015-05-21 NOTE — Progress Notes (Signed)
RN made Dr. Verdell Carmine aware that patient went into afib this morning but that rate is controlled in the 80's. MD acknowledged and stated that he would look in patient's chart about echo and cardiology consult. No orders given at this time.

## 2015-05-21 NOTE — Progress Notes (Signed)
[place] [facility] [place] NAME: Brady Smith    MR#:  [id number]  DATE OF BIRTH:  1945-10-08  SUBJECTIVE:   Patient is here due to an incarcerated ventral hernia. Status post strangulated ventral hernia repair yesterday. Remains intubated and sedated as patient was going through alcohol withdrawal prior to surgery. Pt. Noted to be in a. Fib but rate controlled.   REVIEW OF SYSTEMS:    Review of Systems  Unable to perform ROS: dementia    Nutrition: NPO Tolerating Diet: No Tolerating PT: Await Eval.     DRUG ALLERGIES:  No Known Allergies  VITALS:  Blood pressure 95/66, pulse 88, temperature 99.8 F (37.7 C), temperature source Oral, resp. rate 22, height 5\' 8"  (1.727 m), weight 76.749 kg (169 lb 3.2 oz), SpO2 97 %.  PHYSICAL EXAMINATION:   Physical Exam  GENERAL:  70 y.o.-year-old patient lying in the bed intubated & sedated.   EYES: Pupils equal, round, reactive to light. No scleral icterus.  HEENT: Head atraumatic, normocephalic.  ET tube in place.  NECK:  Supple, no jugular venous distention. No thyroid enlargement, no tenderness.  LUNGS: Normal breath sounds bilaterally, no wheezing, rales, rhonchi. No use of accessory muscles of respiration.  CARDIOVASCULAR: S1, S2 normal. No murmurs, rubs, or gallops.  ABDOMEN: Soft, nondistended. Hypoactive bowel sounds. Positive mid abdominal dressing. Bilateral JP drains in place. EXTREMITIES: No cyanosis, clubbing or edema b/l.    NEUROLOGIC: Sedated and intubated. PSYCHIATRIC: Sedated and intubated.  SKIN: No obvious rash, lesion, or ulcer.    LABORATORY PANEL:   CBC  Recent Labs Lab 05/21/15 0737  WBC 6.1  HGB 14.9  HCT 44.7  PLT 139*   ------------------------------------------------------------------------------------------------------------------  Chemistries   Recent Labs Lab 05/18/15 0423  05/21/15 0737  NA 140  < > 145  K 3.9  < > 3.6  CL 106  < > 115*   CO2 25  < > 23  GLUCOSE 110*  < > 142*  BUN 11  < > 30*  CREATININE 0.91  < > 1.12  CALCIUM 8.7*  < > 7.9*  MG 2.0  --  2.2  AST 13*  --   --   ALT 13*  --   --   ALKPHOS 55  --   --   BILITOT 1.0  --   --   < > = values in this interval not displayed. ------------------------------------------------------------------------------------------------------------------  Cardiac Enzymes No results for input(s): TROPONINI in the last 168 hours. ------------------------------------------------------------------------------------------------------------------  RADIOLOGY:  Dg Chest Port 1 View  05/21/2015  CLINICAL DATA:  Intubated, smoker EXAM: PORTABLE CHEST 1 VIEW COMPARISON:  05/20/2015 FINDINGS: Endotracheal tube is 5 cm above the carina. NG tube is in the stomach. The heart is borderline in size. Bibasilar atelectasis or infiltrates, right greater than left. Possible small right effusion. IMPRESSION: Bibasilar atelectasis or infiltrates, right greater than left. Small right effusion. Electronically Signed   By: Rolm Baptise M.D.   On: 05/21/2015 08:13   Dg Chest Port 1 View  05/20/2015  CLINICAL DATA:  Acute onset of cough.  Initial encounter. EXAM: PORTABLE CHEST 1 VIEW COMPARISON:  Chest radiograph from 07/14/2014 FINDINGS: The lungs are well-aerated. Peribronchial thickening is noted. Mild bibasilar opacities may reflect mild interstitial edema or possibly pneumonia. There is no evidence of pleural effusion or pneumothorax. The cardiomediastinal silhouette is within normal limits. No acute osseous abnormalities are seen. IMPRESSION: Peribronchial thickening noted. Mild bibasilar airspace opacities may reflect  mild interstitial edema or possibly pneumonia. Electronically Signed   By: Garald Balding M.D.   On: 05/20/2015 05:00   Dg Abd 2 Views  05/20/2015  CLINICAL DATA:  70 year old male -followup small bowel obstruction. EXAM: ABDOMEN - 2 VIEW COMPARISON:  05/19/2015 and prior radiographs.   05/17/2015 CT FINDINGS: Dilated small bowel loops within the abdomen again noted. Gas and stool in the colon again noted. Cholecystectomy clips again identified. IMPRESSION: Little significant change in dilated small bowel loops. Electronically Signed   By: Margarette Canada M.D.   On: 05/20/2015 09:59     ASSESSMENT AND PLAN:   70 year old male with past medical history of bladder cancer, pneumonia, alcohol abuse, hypertension who presented to the hospital due to abdominal pain and noted to have a incarcerated ventral hernia.  #1 incarcerated ventral hernia-patient is status post Laparotomy and removal of necrotic bowel and incarcerated hernia repair.  - cont. Care as per Gen. Surgery.   #2 alcohol withdrawal-continue Precedex drip for now. -Patient will likely need to be in CIWA protocol once extubated.  #3 acute respiratory failure-patient was intubated after surgery yesterday. -Currently only at 40% FiO2 and stable pulmonary is following the patient and continue vent management as per them.  #4 atrial fibrillation-patient was noted to be in atrial fibrillation earlier today. He is rate controlled. -We'll monitor and if continues to have A. fib we'll get echocardiogram. Hold off on anticoagulation given the fact the patient had recent surgery.  #5 history of COPD-continue duo nebs when necessary. - cont. pulmicort nebs.   All the records are reviewed and case discussed with Care Management/Social Workerr. Management plans discussed with the patient, family and they are in agreement.  CODE STATUS: Full Code  DVT Prophylaxis: Lovenox  TOTAL Critical Care TIME TAKING CARE OF THIS PATIENT: 30 minutes.   POSSIBLE D/C unclear and DEPENDING ON CLINICAL CONDITION.   Henreitta Leber M.D on 05/21/2015 at 3:34 PM  Between 7am to 6pm - Pager - [phone]  After 6pm go to [email or web address] - password [email or web address]  Office  [phone]  CC: Primary care physician;  Halina Maidens, MD

## 2015-05-21 NOTE — Progress Notes (Signed)
1 Day Post-Op   Subjective:  Patient did well overnight after export her laparotomy with movable of necrotic bowel and repair of ventral hernia. Remains intubated and sedated. No acute events overnight secondary to above.  Vital signs in last 24 hours: Temp:  [97.7 F (36.5 C)-98.9 F (37.2 C)] 98.9 F (37.2 C) (02/26 0808) Pulse Rate:  [55-114] 75 (02/26 0900) Resp:  [12-31] 23 (02/26 0900) BP: (90-140)/(54-81) 100/65 mmHg (02/26 0900) SpO2:  [94 %-100 %] 95 % (02/26 0900) FiO2 (%):  [40 %] 40 % (02/26 0915) Weight:  [76.749 kg (169 lb 3.2 oz)] 76.749 kg (169 lb 3.2 oz) (02/25 1700)    Intake/Output from previous day: 02/25 0701 - 02/26 0700 In: 6291.2 [I.V.:6291.2] Out: 2940 [Urine:1950; Emesis/NG output:100; Drains:190; Blood:200]   Physical exam: Gen.: No acute distress, intubated and sedated Chest: Clear breath sounds bilaterally that are mechanical and ventilated Heart: Regular rate and rhythm GI: abdomen is soft, midline staples approximated without obvious drainage, bilateral lower quadrant JP drains that are in the superficial tissues draining a serosanguineous fluid, unable to assess tenderness secondary to sedation, mild erythema to the left side of the abdomen that is improved from preop and correlates to the area of necrotic bowel that was removed.  Lab Results:  CBC  Recent Labs  05/20/15 0556 05/21/15 0737  WBC 16.0* 6.1  HGB 16.7 14.9  HCT 49.8 44.7  PLT 151 139*   CMP     Component Value Date/Time   NA 145 05/21/2015 0737   NA 141 07/28/2013 0444   K 3.6 05/21/2015 0737   K 4.6 07/28/2013 0444   CL 115* 05/21/2015 0737   CL 111* 07/28/2013 0444   CO2 23 05/21/2015 0737   CO2 26 07/28/2013 0444   GLUCOSE 142* 05/21/2015 0737   GLUCOSE 86 07/28/2013 0444   BUN 30* 05/21/2015 0737   BUN 6* 07/28/2013 0444   CREATININE 1.12 05/21/2015 0737   CREATININE 1.13 07/28/2013 0444   CALCIUM 7.9* 05/21/2015 0737   CALCIUM 8.7 07/28/2013 0444   PROT 6.8  05/18/2015 0423   PROT 7.5 07/27/2013 1041   ALBUMIN 3.8 05/18/2015 0423   ALBUMIN 3.9 07/27/2013 1041   AST 13* 05/18/2015 0423   AST 17 07/27/2013 1041   ALT 13* 05/18/2015 0423   ALT 20 07/27/2013 1041   ALKPHOS 55 05/18/2015 0423   ALKPHOS 81 07/27/2013 1041   BILITOT 1.0 05/18/2015 0423   BILITOT 0.5 07/27/2013 1041   GFRNONAA >60 05/21/2015 0737   GFRNONAA >60 07/28/2013 0444   GFRNONAA >60 04/13/2011 0325   GFRAA >60 05/21/2015 0737   GFRAA >60 07/28/2013 0444   GFRAA >60 04/13/2011 0325   PT/INR No results for input(s): LABPROT, INR in the last 72 hours.  Studies/Results: Dg Chest Port 1 View  05/21/2015  CLINICAL DATA:  Intubated, smoker EXAM: PORTABLE CHEST 1 VIEW COMPARISON:  05/20/2015 FINDINGS: Endotracheal tube is 5 cm above the carina. NG tube is in the stomach. The heart is borderline in size. Bibasilar atelectasis or infiltrates, right greater than left. Possible small right effusion. IMPRESSION: Bibasilar atelectasis or infiltrates, right greater than left. Small right effusion. Electronically Signed   By: Rolm Baptise M.D.   On: 05/21/2015 08:13   Dg Chest Port 1 View  05/20/2015  CLINICAL DATA:  Acute onset of cough.  Initial encounter. EXAM: PORTABLE CHEST 1 VIEW COMPARISON:  Chest radiograph from 07/14/2014 FINDINGS: The lungs are well-aerated. Peribronchial thickening is noted. Mild bibasilar opacities may  reflect mild interstitial edema or possibly pneumonia. There is no evidence of pleural effusion or pneumothorax. The cardiomediastinal silhouette is within normal limits. No acute osseous abnormalities are seen. IMPRESSION: Peribronchial thickening noted. Mild bibasilar airspace opacities may reflect mild interstitial edema or possibly pneumonia. Electronically Signed   By: Garald Balding M.D.   On: 05/20/2015 05:00   Dg Abd 2 Views  05/20/2015  CLINICAL DATA:  70 year old male -followup small bowel obstruction. EXAM: ABDOMEN - 2 VIEW COMPARISON:  05/19/2015 and  prior radiographs.  05/17/2015 CT FINDINGS: Dilated small bowel loops within the abdomen again noted. Gas and stool in the colon again noted. Cholecystectomy clips again identified. IMPRESSION: Little significant change in dilated small bowel loops. Electronically Signed   By: Margarette Canada M.D.   On: 05/20/2015 09:59    Assessment/Plan: 70 year old male 1 day status post export her laparotomy secondary to strangulated ventral hernia. Appreciate internal medicine and critical care assistance with this patient. Would recommend continuing patient on his sedation with the patient for a cystoscopy 24 hours to help with the postoperative recovery of a complex ventral hernia repair. Patient will remain at high risk for recurrence secondary to his poor nutrition and chronic substance abuse. Continue NG tube to suction for now if there is evidence of resolution of bowel obstruction could possibly use it for feeds if he is not able be extubated to feed himself. Continue Foley catheter while intubated and sedated. Okay to start DVT prophylaxis. Patient was given Invanz yesterday the operating room secondary to the discovery of necrotic bowel. We'll continue for a ten-day course of antibiotics.   Clayburn Pert, MD FACS General Surgeon  05/21/2015

## 2015-05-21 NOTE — Consult Note (Signed)
PULMONARY / CRITICAL CARE MEDICINE   Name: Brady Smith MRN: [id number] DOB: [birth year]    ADMISSION DATE:  05/17/2015 CONSULTATION DATE:  02/26  REFERRING MD:  Adonis Huguenin  PT PROFILE: 5 M with history of heavy EtOH and heavy smoking history admitted early 02/23 with incarcerated ventral hernia and SBO. Underwent laparotomy 02/25 and was noted to have a difficult airway. Prior to surgery, there was concern for alcohol withdraw. Dr Adonis Huguenin desires that he remain on vent at least through 02/26 due to concern about stability of surgical wound.   MAJOR EVENTS/TEST RESULTS: 02/23 Admission 02/23 IM consult 02/25 Laparotomy, strangulated small bowel, small bowel resection  INDWELLING DEVICES:: ETT 02/25 >>   MICRO DATA: Urine 02/23 >> NEG MRSA PCR 02/25 >> NEG  ANTIMICROBIALS:  Ertapenem 02/25 >>    HISTORY OF PRESENT ILLNESS:   Pt unable to provide history. Admission note and hospital records reviewed in detail  PAST MEDICAL HISTORY :  He  has a past medical history of History of kidney cancer; Bladder cancer ([place]); and Pneumonia.  PAST SURGICAL HISTORY: He  has past surgical history that includes Hernia repair; Cholecystectomy; Cystectomy w/ continent diversion (1996); Prostatectomy (1996); Tonsillectomy; and Colonoscopy (2000).  No Known Allergies  No current facility-administered medications on file prior to encounter.   Current Outpatient Prescriptions on File Prior to Encounter  Medication Sig  . albuterol (VENTOLIN HFA) 108 (90 BASE) MCG/ACT inhaler Inhale 2 puffs into the lungs every 6 (six) hours as needed.  Marland Kitchen omeprazole (PRILOSEC) 20 MG capsule Take 1 capsule (20 mg total) by mouth daily. (Patient not taking: Reported on 02/08/2015)    FAMILY HISTORY:  His has no family status information on file.   SOCIAL HISTORY: He  reports that he has been smoking Cigarettes.  He has a 114 pack-year smoking history. He does not have any smokeless tobacco history on file. He  reports that he drinks about 8.4 oz of alcohol per week. He reports that he does not use illicit drugs.  REVIEW OF SYSTEMS:   Level 5 caveat  SUBJECTIVE:    VITAL SIGNS: BP 99/60 mmHg  Pulse 89  Temp(Src) 99.8 F (37.7 C) (Oral)  Resp 23  Ht 5\' 8"  (1.727 m)  Wt 169 lb 3.2 oz (76.749 kg)  BMI 25.73 kg/m2  SpO2 96%  HEMODYNAMICS:    VENTILATOR SETTINGS: Vent Mode:  [-] PSV FiO2 (%):  [40 %] 40 % Set Rate:  [14 bmp] 14 bmp Vt Set:  [500 mL] 500 mL PEEP:  [5 cmH20] 5 cmH20 Pressure Support:  [19 cmH20] 19 cmH20 Plateau Pressure:  [14 cmH20] 14 cmH20  INTAKE / OUTPUT: I/O last 3 completed shifts: In: 7495.2 [I.V.:7495.2] Out: [id number] [Urine:4200; Emesis/NG output:100; Drains:190; Other:500; Blood:200]  PHYSICAL EXAMINATION: General: appears older than chronological age, intubated, RASS -3 Neuro: CNs intact, MAEs HEENT: NCAT Cardiovascular: reg, no M Lungs: no wheezes, no adventitious sounds Abdomen: post op changes, abd soft, diminished to absent BS Ext: warm, no edema  LABS:  BMET  Recent Labs Lab 05/19/15 0719 05/20/15 0556 05/21/15 0737  NA 141 141 145  K 4.3 3.5 3.6  CL 110 112* 115*  CO2 26 22 23   BUN 14 19 30*  CREATININE 0.90 0.96 1.12  GLUCOSE 186* 143* 142*    Electrolytes  Recent Labs Lab 05/18/15 0423 05/19/15 0719 05/20/15 0556 05/21/15 0737  CALCIUM 8.7* 8.4* 8.4* 7.9*  MG 2.0  --   --  2.2  PHOS 4.2  --   --  3.2    CBC  Recent Labs Lab 05/19/15 0719 05/20/15 0556 05/21/15 0737  WBC 14.3* 16.0* 6.1  HGB 16.4 16.7 14.9  HCT 48.5 49.8 44.7  PLT 155 151 139*    Coag's  Recent Labs Lab 05/17/15 2151 05/18/15 0423  APTT 29 29  INR 1.01 1.13    Sepsis Markers No results for input(s): LATICACIDVEN, PROCALCITON, O2SATVEN in the last 168 hours.  ABG  Recent Labs Lab 05/19/15 0915 05/20/15 1800  PHART 7.47* 7.31*  PCO2ART 32 52*  PO2ART 53* 74*    Liver Enzymes  Recent Labs Lab 05/17/15 2151  05/18/15 0423  AST 19 13*  ALT 16* 13*  ALKPHOS 58 55  BILITOT 1.1 1.0  ALBUMIN 4.1 3.8    Cardiac Enzymes No results for input(s): TROPONINI, PROBNP in the last 168 hours.  Glucose  Recent Labs Lab 05/19/15 0747 05/19/15 1141 05/19/15 1703 05/20/15 1758  GLUCAP 164* 198* 174* 137*    CXR: Minimal RLL atx   ASSESSMENT / PLAN:  PULMONARY A: Post op vent status  Heavy smoker without prior dx of COPD, no overt airflow obstruction P:   Cont vent support - settings reviewed and/or adjusted Work in PSV mode as tolerated Cont vent bundle Daily SBT if/when meets criteria Cont nebulized steroids and bronchodilators Per Dr Adonis Huguenin, no extubation today  CARDIOVASCULAR A:  Transient post op hypotension - resolved P:  MAP goal > 65 mmHg  RENAL A:   Mild hypernatremia Borderline hypokalemia P:   Monitor BMET intermittently Monitor I/Os Correct electrolytes as indicated Cont LR @ 75 cc/hr  GASTROINTESTINAL A:   Post laparotomy P:   SUP: IV PPI Nutrition timing and route to be determined by surgery  HEMATOLOGIC A:   Very mild thrombocytopenia P:  DVT px: LMWH Monitor CBC intermittently Transfuse per usual ICU guidelines  INFECTIOUS A:   Post laparotomy, small bowel infarction P:   Monitor temp, WBC count Micro and abx as above  ENDOCRINE A:   Mild hyperglycemia without prior dx of DM P:   Monitor glu on chem panels Consider SSI for glu > 180  NEUROLOGIC A:   Heavy EtOH use Post op pain P:   RASS goal: -2, -3 Cont Precedex infusion Initiate fentanyl infusion 02/26 PRN lorazepam  FAMILY  No family @ bedside   Discussed with Dr Adonis Huguenin CCM time: 33 mins  Merton Border, MD PCCM service Mobile [phone] Pager [phone]   05/21/2015, 1:19 PM

## 2015-05-21 NOTE — Anesthesia Postprocedure Evaluation (Signed)
Anesthesia Post Note  Patient: Brady Smith  Procedure(s) Performed: Procedure(s) (LRB): EXPLORATORY LAPAROTOMY (N/A) SMALL BOWEL RESECTION  Patient location during evaluation: ICU Anesthesia Type: General Level of consciousness: obtunded/minimal responses and patient remains intubated per anesthesia plan Pain management: pain level controlled Vital Signs Assessment: post-procedure vital signs reviewed and stable Respiratory status: patient on ventilator - see flowsheet for VS and patient remains intubated per anesthesia plan Cardiovascular status: stable Anesthetic complications: no    Last Vitals:  Filed Vitals:   05/21/15 0600 05/21/15 0700  BP: 97/65 101/71  Pulse: 97 89  Temp:    Resp: 27 26    Last Pain:  Filed Vitals:   05/21/15 0711  PainSc: 0-No pain                 KEPHART,WILLIAM K

## 2015-05-22 ENCOUNTER — Encounter: Payer: Self-pay | Admitting: General Surgery

## 2015-05-22 ENCOUNTER — Inpatient Hospital Stay: Payer: Medicare PPO

## 2015-05-22 DIAGNOSIS — J9601 Acute respiratory failure with hypoxia: Secondary | ICD-10-CM

## 2015-05-22 DIAGNOSIS — K460 Unspecified abdominal hernia with obstruction, without gangrene: Secondary | ICD-10-CM

## 2015-05-22 LAB — COMPREHENSIVE METABOLIC PANEL
ALT: 41 U/L (ref 17–63)
AST: 64 U/L — AB (ref 15–41)
Albumin: 1.9 g/dL — ABNORMAL LOW (ref 3.5–5.0)
Alkaline Phosphatase: 28 U/L — ABNORMAL LOW (ref 38–126)
Anion gap: 6 (ref 5–15)
BUN: 36 mg/dL — ABNORMAL HIGH (ref 6–20)
CALCIUM: 8.1 mg/dL — AB (ref 8.9–10.3)
CHLORIDE: 116 mmol/L — AB (ref 101–111)
CO2: 29 mmol/L (ref 22–32)
CREATININE: 1.2 mg/dL (ref 0.61–1.24)
GFR calc non Af Amer: 60 mL/min — ABNORMAL LOW (ref >60)
Glucose, Bld: 118 mg/dL — ABNORMAL HIGH (ref 65–99)
POTASSIUM: 3.7 mmol/L (ref 3.5–5.1)
SODIUM: 151 mmol/L — AB (ref 135–145)
Total Bilirubin: 0.8 mg/dL (ref 0.3–1.2)
Total Protein: 5.2 g/dL — ABNORMAL LOW (ref 6.5–8.1)

## 2015-05-22 LAB — BLOOD GAS, ARTERIAL
ACID-BASE DEFICIT: 2.7 mmol/L — AB (ref 0.0–2.0)
Allens test (pass/fail): POSITIVE — AB
Bicarbonate: 20.6 mEq/L — ABNORMAL LOW (ref 21.0–28.0)
FIO2: 0.4
O2 SAT: 98.3 %
PCO2 ART: 31 mmHg — AB (ref 32.0–48.0)
PEEP/CPAP: 5 cmH2O
PH ART: 7.43 (ref 7.350–7.450)
PO2 ART: 107 mmHg (ref 83.0–108.0)
PRESSURE SUPPORT: 14 cmH2O
Patient temperature: 37

## 2015-05-22 LAB — CBC
HCT: 42.4 % (ref 40.0–52.0)
Hemoglobin: 14.1 g/dL (ref 13.0–18.0)
MCH: 32.2 pg (ref 26.0–34.0)
MCHC: 33.4 g/dL (ref 32.0–36.0)
MCV: 96.4 fL (ref 80.0–100.0)
PLATELETS: 116 10*3/uL — AB (ref 150–440)
RBC: 4.39 MIL/uL — AB (ref 4.40–5.90)
RDW: 14.1 % (ref 11.5–14.5)
WBC: 5.7 10*3/uL (ref 3.8–10.6)

## 2015-05-22 LAB — URINALYSIS COMPLETE WITH MICROSCOPIC (ARMC ONLY)
BACTERIA UA: NONE SEEN
Bilirubin Urine: NEGATIVE
GLUCOSE, UA: NEGATIVE mg/dL
Leukocytes, UA: NEGATIVE
NITRITE: NEGATIVE
Protein, ur: NEGATIVE mg/dL
SPECIFIC GRAVITY, URINE: 1.015 (ref 1.005–1.030)
pH: 6 (ref 5.0–8.0)

## 2015-05-22 LAB — MAGNESIUM: MAGNESIUM: 2.4 mg/dL (ref 1.7–2.4)

## 2015-05-22 LAB — GLUCOSE, CAPILLARY
GLUCOSE-CAPILLARY: 114 mg/dL — AB (ref 65–99)
GLUCOSE-CAPILLARY: 120 mg/dL — AB (ref 65–99)
GLUCOSE-CAPILLARY: 125 mg/dL — AB (ref 65–99)
Glucose-Capillary: 111 mg/dL — ABNORMAL HIGH (ref 65–99)

## 2015-05-22 LAB — PHOSPHORUS: PHOSPHORUS: 3.1 mg/dL (ref 2.5–4.6)

## 2015-05-22 MED ORDER — PANTOPRAZOLE SODIUM 40 MG IV SOLR
40.0000 mg | INTRAVENOUS | Status: DC
  Administered 2015-05-22 – 2015-05-29 (×8): 40 mg via INTRAVENOUS
  Filled 2015-05-22 (×8): qty 40

## 2015-05-22 MED ORDER — ACETAMINOPHEN 650 MG RE SUPP
650.0000 mg | RECTAL | Status: DC | PRN
  Administered 2015-05-22 – 2015-06-04 (×8): 650 mg via RECTAL
  Filled 2015-05-22 (×8): qty 1

## 2015-05-22 MED ORDER — INSULIN ASPART 100 UNIT/ML ~~LOC~~ SOLN
2.0000 [IU] | SUBCUTANEOUS | Status: DC
Start: 2015-05-22 — End: 2015-05-30
  Administered 2015-05-22: 2 [IU] via SUBCUTANEOUS
  Administered 2015-05-23: 4 [IU] via SUBCUTANEOUS
  Administered 2015-05-23 – 2015-05-24 (×8): 2 [IU] via SUBCUTANEOUS
  Administered 2015-05-24 – 2015-05-26 (×5): 4 [IU] via SUBCUTANEOUS
  Administered 2015-05-26: 2 [IU] via SUBCUTANEOUS
  Administered 2015-05-26 – 2015-05-27 (×3): 4 [IU] via SUBCUTANEOUS
  Administered 2015-05-27 (×2): 2 [IU] via SUBCUTANEOUS
  Administered 2015-05-27: 4 [IU] via SUBCUTANEOUS
  Administered 2015-05-27: 2 [IU] via SUBCUTANEOUS
  Administered 2015-05-27 – 2015-05-28 (×2): 4 [IU] via SUBCUTANEOUS
  Administered 2015-05-28: 2 [IU] via SUBCUTANEOUS
  Administered 2015-05-28 (×2): 4 [IU] via SUBCUTANEOUS
  Administered 2015-05-28 (×2): 2 [IU] via SUBCUTANEOUS
  Administered 2015-05-29: 4 [IU] via SUBCUTANEOUS
  Administered 2015-05-29: 2 [IU] via SUBCUTANEOUS
  Administered 2015-05-29 (×3): 4 [IU] via SUBCUTANEOUS
  Administered 2015-05-30 (×2): 2 [IU] via SUBCUTANEOUS
  Filled 2015-05-22: qty 2
  Filled 2015-05-22 (×2): qty 4
  Filled 2015-05-22: qty 2
  Filled 2015-05-22 (×2): qty 4
  Filled 2015-05-22 (×2): qty 2
  Filled 2015-05-22: qty 4
  Filled 2015-05-22: qty 2
  Filled 2015-05-22 (×2): qty 4
  Filled 2015-05-22: qty 3
  Filled 2015-05-22 (×3): qty 4
  Filled 2015-05-22 (×4): qty 2
  Filled 2015-05-22 (×5): qty 4
  Filled 2015-05-22: qty 2
  Filled 2015-05-22: qty 4
  Filled 2015-05-22 (×2): qty 2
  Filled 2015-05-22: qty 4
  Filled 2015-05-22: qty 3
  Filled 2015-05-22 (×3): qty 2
  Filled 2015-05-22: qty 4
  Filled 2015-05-22: qty 2

## 2015-05-22 MED ORDER — DIAZEPAM 5 MG PO TABS: 5.0000 mg | ORAL_TABLET | Freq: Four times a day (QID) | ORAL | Status: DC

## 2015-05-22 MED ORDER — DIAZEPAM 5 MG/ML IJ SOLN
5.0000 mg | Freq: Four times a day (QID) | INTRAMUSCULAR | Status: DC
  Administered 2015-05-22 – 2015-05-28 (×24): 5 mg via INTRAVENOUS
  Filled 2015-05-22 (×25): qty 2

## 2015-05-22 MED ORDER — BUDESONIDE 0.5 MG/2ML IN SUSP
0.5000 mg | Freq: Two times a day (BID) | RESPIRATORY_TRACT | Status: DC
  Administered 2015-05-22 – 2015-06-08 (×34): 0.5 mg via RESPIRATORY_TRACT
  Filled 2015-05-22 (×36): qty 2

## 2015-05-22 MED ORDER — TRACE MINERALS CR-CU-MN-SE-ZN 10-1000-500-60 MCG/ML IV SOLN
INTRAVENOUS | Status: AC
  Administered 2015-05-22: 19:00:00 via INTRAVENOUS
  Filled 2015-05-22: qty 960

## 2015-05-22 MED ORDER — DEXTROSE 5 % IV SOLN
INTRAVENOUS | Status: DC
  Administered 2015-05-22 – 2015-05-25 (×7): via INTRAVENOUS

## 2015-05-22 MED ORDER — PANTOPRAZOLE SODIUM 40 MG PO PACK: 40.0000 mg | PACK | ORAL | Status: DC

## 2015-05-22 MED ORDER — SODIUM CHLORIDE 0.9 % IV SOLN
1.0000 g | INTRAVENOUS | Status: DC
  Administered 2015-05-22 – 2015-05-27 (×6): 1 g via INTRAVENOUS
  Filled 2015-05-22 (×7): qty 1

## 2015-05-22 NOTE — Progress Notes (Signed)
[facility] NAME: Brady Smith    MR#:  [id number]  DATE OF BIRTH:  05-18-1945  SUBJECTIVE:   Patient is here due to an incarcerated ventral hernia. Status post strangulated ventral hernia repair POD # 2. Remains intubated and sedated as patient going through alcohol withdrawal prior to surgery. Remains in atrial fibrillation but rate controlled. Patient had a fever earlier today.  REVIEW OF SYSTEMS:    Review of Systems  Unable to perform ROS: dementia    Nutrition: NPO Tolerating Diet: No Tolerating PT: Await Eval.     DRUG ALLERGIES:  No Known Allergies  VITALS:  Blood pressure 110/73, pulse 87, temperature 101.5 F (38.6 C), temperature source Rectal, resp. rate 18, height 5\' 8"  (1.727 m), weight 76.476 kg (168 lb 9.6 oz), SpO2 97 %.  PHYSICAL EXAMINATION:   Physical Exam  GENERAL:  70 y.o.-year-old patient lying in the bed intubated & sedated.   EYES: Pupils equal, round, reactive to light. No scleral icterus.  HEENT: Head atraumatic, normocephalic.  ET tube in place.  NECK:  Supple, no jugular venous distention. No thyroid enlargement, no tenderness.  LUNGS: Normal breath sounds bilaterally, no wheezing, rales, rhonchi. No use of accessory muscles of respiration.  CARDIOVASCULAR: S1, S2 Irregular. No murmurs, rubs, or gallops.  ABDOMEN: Soft, nondistended. Hypoactive bowel sounds. Positive mid abdominal dressing. Bilateral JP drains in place. EXTREMITIES: No cyanosis, clubbing or edema b/l.    NEUROLOGIC: Sedated and intubated. PSYCHIATRIC: Sedated and intubated.  SKIN: No obvious rash, lesion, or ulcer.    LABORATORY PANEL:   CBC  Recent Labs Lab 05/22/15 0522  WBC 5.7  HGB 14.1  HCT 42.4  PLT 116*   ------------------------------------------------------------------------------------------------------------------  Chemistries   Recent Labs Lab 05/22/15 0522 05/22/15 0524  NA 151*  --    K 3.7  --   CL 116*  --   CO2 29  --   GLUCOSE 118*  --   BUN 36*  --   CREATININE 1.20  --   CALCIUM 8.1*  --   MG  --  2.4  AST 64*  --   ALT 41  --   ALKPHOS 28*  --   BILITOT 0.8  --    ------------------------------------------------------------------------------------------------------------------  Cardiac Enzymes No results for input(s): TROPONINI in the last 168 hours. ------------------------------------------------------------------------------------------------------------------  RADIOLOGY:  Dg Chest Port 1 View  05/22/2015  CLINICAL DATA:  Respiratory failure EXAM: PORTABLE CHEST 1 VIEW COMPARISON:  05/21/2015 FINDINGS: Endotracheal tube and NG tube are unchanged. Stable cardiac silhouette. There is increase in bibasilar opacities. Upper lungs are clear. IMPRESSION: 1. Stable support apparatus. 2. Increased bibasilar linear opacities representing atelectasis versus infiltrate. Electronically Signed   By: Suzy Bouchard M.D.   On: 05/22/2015 07:28   Dg Chest Port 1 View  05/21/2015  CLINICAL DATA:  Intubated, smoker EXAM: PORTABLE CHEST 1 VIEW COMPARISON:  05/20/2015 FINDINGS: Endotracheal tube is 5 cm above the carina. NG tube is in the stomach. The heart is borderline in size. Bibasilar atelectasis or infiltrates, right greater than left. Possible small right effusion. IMPRESSION: Bibasilar atelectasis or infiltrates, right greater than left. Small right effusion. Electronically Signed   By: Rolm Baptise M.D.   On: 05/21/2015 08:13     ASSESSMENT AND PLAN:   70 year old male with past medical history of bladder cancer, pneumonia, alcohol abuse, hypertension who presented to the hospital due to abdominal pain and noted to have a incarcerated ventral  hernia.  #1 incarcerated ventral hernia-patient is status post Laparotomy and removal of necrotic bowel and incarcerated hernia repair. POD # 2.  - cont. Care as per Gen. Surgery.   #2 alcohol withdrawal-continue  Precedex drip for now. -Patient will likely need to be in CIWA protocol once extubated.  #3 acute respiratory failure-patient was intubated after surgery on 2/25. -Currently only at 40% FiO2 and stable pulmonary is following the patient and continue vent management as per them.  #4 atrial fibrillation-remains in atrial fibrillation but rate controlled. -I will check a two-dimensional echocardiogram. Hold on long long-term anticoagulation given the fact the patient recent surgery.  #5 fever of unknown origin-patient had a fever of 101 earlier today. He has had blood, urine cultures and sputum culture sent. -Continue empiric ertapenem. Follow cultures, follow fever curve. Off vasopressors.  #6 history of COPD-continue duo nebs when necessary. - cont. pulmicort nebs.   #7 hypernatremia-switch fluids to D5W and follow sodium.  All the records are reviewed and case discussed with Care Management/Social Workerr. Management plans discussed with the patient, family and they are in agreement.  CODE STATUS: Full Code  DVT Prophylaxis: Lovenox  TOTAL Critical Care TIME TAKING CARE OF THIS PATIENT: 30 minutes.   POSSIBLE D/C unclear and DEPENDING ON CLINICAL CONDITION.   Henreitta Leber M.D on 05/22/2015 at 3:28 PM  Between 7am to 6pm - Pager - [phone]  After 6pm go to [email or web address] - password [email or web address]  Office  [phone]  CC: Primary care physician; Halina Maidens, MD

## 2015-05-22 NOTE — Progress Notes (Signed)
Dr. Oletta Darter spoke with Vesta Mixer, RN from [facility] and discussed if PICC line should be pulled back or not.  After discussing Dr. Oletta Darter told Vesta Mixer, RN that it was okay to leave PICC in current position and okay to use line.

## 2015-05-22 NOTE — Progress Notes (Signed)
RN discussed with Dr. Mortimer Fries during rounds that patient's foley is not draining and that irrigation needs to be done. MD gave order to irrigate foley PRN.

## 2015-05-22 NOTE — Progress Notes (Signed)
RN called [facility] and made Rep. Aware that PICC line needs to be pulled back 3cm per Dr. Oletta Darter.  Rep. To call Vesta Mixer, RN.

## 2015-05-22 NOTE — Progress Notes (Signed)
Dr. Dahlia Byes in room to assess patient. RN made MD aware that patient has fever but no tylenol ordered and RN made MD aware that patient's foley was changed out this morning due to not draining and a lot of mucous that was clogging foley. MD gave order for urine culture and UA along with tylenol suppository.

## 2015-05-22 NOTE — Care Management (Signed)
Patient transferred to icu post op laparotomy for necrotic bowel repair for postoperative shock.  He is on CIWA protocol and on precedex drip.  There is a care management referral present to address home environment.  ?Lives in a boarding house and concern whether it is safe.  Spoke with patient's sister Arnold Long and patient does not have a HCPOA.  Patient'soler  brother Leston Holthus "handles things" for patient.  There were no concerns about patient's current living conditions prior to this episode of illness but there would be concerns about him returning there at discharge "if he needs any help."  Arnold Long says that she has been told that patient may need to go to a skilled nursing home at discharge.  Discussed with Arnold Long that it is very possible this will be needed but too early in the hospitalization to make specific plans for skilled nursing.  She does acknowledge that patient does drink daily.

## 2015-05-22 NOTE — Progress Notes (Signed)
[facility] Progress Note Patient Name: COTY KACZMARCZYK DOB: 1945-12-17 MRN: [id number]   Date of Service  05/22/2015  HPI/Events of Note  Asked to review CXR for R arm PICC line. R arm PICC line tip in proximal R atrium.   eICU Interventions  Would pull PICC line back by 3 cm. OK to use once pulled back.      Intervention Category Intermediate Interventions: Diagnostic test evaluation  Lysle Dingwall 05/22/2015, 6:34 PM

## 2015-05-22 NOTE — Progress Notes (Signed)
PARENTERAL NUTRITION CONSULT NOTE - INITIAL  Pharmacy Consult for electrolytes and insulin management Indication: ICU patient requiring mechanical ventilation and TPN  No Known Allergies  Patient Measurements: Height: 5\' 8"  (172.7 cm) Weight: 168 lb 9.6 oz (76.476 kg) IBW/kg (Calculated) : 68.4   Vital Signs: Temp: 101.5 F (38.6 C) (02/27 1345) Temp Source: Rectal (02/27 1345) BP: 110/73 mmHg (02/27 1400) Pulse Rate: 87 (02/27 1400) Intake/Output from previous day: 02/26 0701 - 02/27 0700 In: 2711.6 [I.V.:2631.6; NG/GT:30; IV Piggyback:50] Out: 2250 [id number]; Emesis/NG output:100; Drains:375] Intake/Output from this shift: Total I/O In: 688.5 [I.V.:678.5; Other:10] Out: 350 [Urine:300; Drains:50]  Labs:  Recent Labs  05/20/15 0556 05/21/15 0737 05/22/15 0522  WBC 16.0* 6.1 5.7  HGB 16.7 14.9 14.1  HCT 49.8 44.7 42.4  PLT 151 139* 116*     Recent Labs  05/20/15 0556 05/21/15 0737 05/22/15 0522 05/22/15 0524  NA 141 145 151*  --   K 3.5 3.6 3.7  --   CL 112* 115* 116*  --   CO2 22 23 29   --   GLUCOSE 143* 142* 118*  --   BUN 19 30* 36*  --   CREATININE 0.96 1.12 1.20  --   CALCIUM 8.4* 7.9* 8.1*  --   MG  --  2.2  --  2.4  PHOS  --  3.2  --  3.1  PROT  --   --  5.2*  --   ALBUMIN  --   --  1.9*  --   AST  --   --  64*  --   ALT  --   --  41  --   ALKPHOS  --   --  28*  --   BILITOT  --   --  0.8  --    Estimated Creatinine Clearance: 56.2 mL/min (by C-G formula based on Cr of 1.2).    Recent Labs  05/19/15 1703 05/20/15 1758  GLUCAP 174* 137*    Medical History: Past Medical History  Diagnosis Date  . History of kidney cancer   . Bladder cancer ([place])   . Pneumonia     Medications:  Scheduled:  . antiseptic oral rinse  7 mL Mouth Rinse QID  . budesonide (PULMICORT) nebulizer solution  0.5 mg Nebulization BID  . chlorhexidine gluconate  15 mL Mouth Rinse BID  . diazepam  5 mg Intravenous Q6H  . enoxaparin (LOVENOX) injection  40  mg Subcutaneous Q24H  . ertapenem (INVANZ) IV  250 mg Intravenous Once  . ertapenem (INVANZ) IV  1 g Intravenous Once  . ertapenem  1 g Intravenous Q24H  . insulin aspart  2-6 Units Subcutaneous 6 times per day  . ipratropium-albuterol  3 mL Nebulization Q6H  . pantoprazole (PROTONIX) IV  40 mg Intravenous Q24H  . thiamine  100 mg Intravenous Daily   Infusions:  . Marland KitchenTPN (CLINIMIX-E) Adult    . dextrose 75 mL/hr at 05/22/15 0830  . fentaNYL infusion INTRAVENOUS 150 mcg/hr (05/22/15 1400)    Insulin Requirements in the past 24 hours:  0  Current Nutrition:  TPN 5/20 at 79mL/hr   Plan:  Will start patient on SSI Q4hr while patient is on TPN. Will obtain electrolytes with am labs.   Pharmacy will continue to monitor and adjust per consult.    Eline Geng L 05/22/2015,2:36 PM

## 2015-05-22 NOTE — Progress Notes (Signed)
Initial Nutrition Assessment    INTERVENTION:   PN: PICC line ordered and pending placement; per report during ICU rounds, pt likely to be on vent several more days. Recommend starting 5%AA/20%Dextrose at rate of 40 ml/hr, goal rate of 85 ml/hr providing 2040 mL fluid, 102 g of protein, 1795 kcals. Recommend checking TPN labs, pharmacy consult for electrolyte and glucose management assistance, daily weights, I/O. If TG wdl, recommend addition of 20% lipids 2x weekly for additional kcals. Meets >80% estimated kcals, protein needs. Continue to assess  NUTRITION DIAGNOSIS:   Inadequate oral intake related to acute illness, altered GI function as evidenced by NPO status.  GOAL:   Provide needs based on ASPEN/SCCM guidelines, Patient will meet greater than or equal to 90% of their needs  MONITOR:    (PN, Energy Intake, Anthropometrics, Electrolyte/Renal Profile, Glucose Profile)  REASON FOR ASSESSMENT:   Ventilator, Consult New TPN/TNA  ASSESSMENT:    Pt admitted with strangulated multiloculated ventral hernia with necrotic loops of small bowel s/p small bowel resection on 2/25; pt remains on vent postop ; pt with EtOH abuse likely withdraw  Past Medical History  Diagnosis Date  . History of kidney cancer   . Bladder cancer ([place])   . Pneumonia    Diet Order:   NPO  Energy Intake: NPO day 5  Digestive System: abdomen distended/taut, NG to LIS with dark green drainage, no BM, BS faint   Recent Labs Lab 05/18/15 0423  05/20/15 0556 05/21/15 0737 05/22/15 0522 05/22/15 0524  NA 140  < > 141 145 151*  --   K 3.9  < > 3.5 3.6 3.7  --   CL 106  < > 112* 115* 116*  --   CO2 25  < > 22 23 29   --   BUN 11  < > 19 30* 36*  --   CREATININE 0.91  < > 0.96 1.12 1.20  --   CALCIUM 8.7*  < > 8.4* 7.9* 8.1*  --   MG 2.0  --   --  2.2  --  2.4  PHOS 4.2  --   --  3.2  --  3.1  GLUCOSE 110*  < > 143* 142* 118*  --   < > = values in this interval not displayed.  Glucose Profile:    Recent Labs  05/19/15 1703 05/20/15 1758  GLUCAP 174* 137*   Meds: D5 at 75 ml/hr (306 kcals)  Height:   Ht Readings from Last 1 Encounters:  05/18/15 5\' 8"  (1.727 m)    Weight:   Wt Readings from Last 1 Encounters:  05/20/15 169 lb 3.2 oz (76.749 kg)   Wt Readings from Last 10 Encounters:  05/20/15 169 lb 3.2 oz (76.749 kg)  02/08/15 166 lb 12.8 oz (75.66 kg)  08/30/14 163 lb 6.4 oz (74.118 kg)  06/10/11 181 lb 3.2 oz (82.192 kg)    BMI:  Body mass index is 25.73 kg/(m^2).  Estimated Nutritional Needs:   Kcal:  2138 kcals (Ve: 13.8, tmax: 38.8) using wt of 76.8 kg  Protein:  116-154 g(1.5-2.0 g/kg)   Fluid:  1925-2310 mL (25-30 ml/kg)     HIGH Care Level  Kerman Passey MS, RD, LDN [phone] Pager  [phone] Weekend/On-Call Pager

## 2015-05-22 NOTE — Progress Notes (Signed)
2 Days Post-Op   Subjective: pt intubated and sedated Febrile last night not unexpected given his injury and necrosis of the bowel   Vital signs in last 24 hours: Temp:  [98.5 F (36.9 C)-102.3 F (39.1 C)] 101.5 F (38.6 C) (02/27 1345) Pulse Rate:  [54-112] 87 (02/27 1400) Resp:  [16-28] 18 (02/27 1400) BP: (99-123)/(66-82) 110/73 mmHg (02/27 1400) SpO2:  [90 %-97 %] 95 % (02/27 1400) FiO2 (%):  [40 %] 40 % (02/27 1205) Weight:  [76.476 kg (168 lb 9.6 oz)] 76.476 kg (168 lb 9.6 oz) (02/27 1418)    Intake/Output from previous day: 02/26 0701 - 02/27 0700 In: 2711.6 [I.V.:2631.6; NG/GT:30; IV Piggyback:50] Out: 2250 [Urine:1775; Emesis/NG output:100; Drains:375] NAD sedated GI: Incision clean dry and intact, JP with serous drainage. There is significant erythema on the hernia sac this is obvious really related to a necrosis of the bowel otherwise abdomen is soft.  Lab Results:  CBC  Recent Labs  05/21/15 0737 05/22/15 0522  WBC 6.1 5.7  HGB 14.9 14.1  HCT 44.7 42.4  PLT 139* 116*   CMP     Component Value Date/Time   NA 151* 05/22/2015 0522   NA 141 07/28/2013 0444   K 3.7 05/22/2015 0522   K 4.6 07/28/2013 0444   CL 116* 05/22/2015 0522   CL 111* 07/28/2013 0444   CO2 29 05/22/2015 0522   CO2 26 07/28/2013 0444   GLUCOSE 118* 05/22/2015 0522   GLUCOSE 86 07/28/2013 0444   BUN 36* 05/22/2015 0522   BUN 6* 07/28/2013 0444   CREATININE 1.20 05/22/2015 0522   CREATININE 1.13 07/28/2013 0444   CALCIUM 8.1* 05/22/2015 0522   CALCIUM 8.7 07/28/2013 0444   PROT 5.2* 05/22/2015 0522   PROT 7.5 07/27/2013 1041   ALBUMIN 1.9* 05/22/2015 0522   ALBUMIN 3.9 07/27/2013 1041   AST 64* 05/22/2015 0522   AST 17 07/27/2013 1041   ALT 41 05/22/2015 0522   ALT 20 07/27/2013 1041   ALKPHOS 28* 05/22/2015 0522   ALKPHOS 81 07/27/2013 1041   BILITOT 0.8 05/22/2015 0522   BILITOT 0.5 07/27/2013 1041   GFRNONAA 60* 05/22/2015 0522   GFRNONAA >60 07/28/2013 0444   GFRNONAA >60 04/13/2011 0325   GFRAA >60 05/22/2015 0522   GFRAA >60 07/28/2013 0444   GFRAA >60 04/13/2011 0325   PT/INR No results for input(s): LABPROT, INR in the last 72 hours.  Studies/Results: Dg Chest Port 1 View  05/22/2015  CLINICAL DATA:  Respiratory failure EXAM: PORTABLE CHEST 1 VIEW COMPARISON:  05/21/2015 FINDINGS: Endotracheal tube and NG tube are unchanged. Stable cardiac silhouette. There is increase in bibasilar opacities. Upper lungs are clear. IMPRESSION: 1. Stable support apparatus. 2. Increased bibasilar linear opacities representing atelectasis versus infiltrate. Electronically Signed   By: Suzy Bouchard M.D.   On: 05/22/2015 07:28   Dg Chest Port 1 View  05/21/2015  CLINICAL DATA:  Intubated, smoker EXAM: PORTABLE CHEST 1 VIEW COMPARISON:  05/20/2015 FINDINGS: Endotracheal tube is 5 cm above the carina. NG tube is in the stomach. The heart is borderline in size. Bibasilar atelectasis or infiltrates, right greater than left. Possible small right effusion. IMPRESSION: Bibasilar atelectasis or infiltrates, right greater than left. Small right effusion. Electronically Signed   By: Rolm Baptise M.D.   On: 05/21/2015 08:13    Assessment/Plan: Status post complex hernia repair w  bowel bowel resection for incarcerated ventral hernia Febrile likely from the necrosis of the bowel. We will send a urine  culture for now I do not think that we need to add vancomycin necessarily. Clinically there is no evidence of narrowing or any other infection at this time. Continue NG tube for suspected ileus, May start TPN and a PICC line. I appreciate  critical care team assistance with ventilatory and critical care management   Caroleen Hamman, MD [place]  05/22/2015

## 2015-05-22 NOTE — Progress Notes (Signed)
RN notified Ginger, at [facility] of consult for PICC line.

## 2015-05-22 NOTE — Consult Note (Signed)
PULMONARY / CRITICAL CARE MEDICINE   Name: Brady Smith MRN: [id number] DOB: [birth year]    ADMISSION DATE:  05/17/2015 CONSULTATION DATE:  02/26  REFERRING MD:  Adonis Huguenin  PT PROFILE: 4 M with history of heavy EtOH and heavy smoking history admitted early 02/23 with incarcerated ventral hernia and SBO. Underwent laparotomy 02/25 and was noted to have a difficult airway. Prior to surgery, there was concern for alcohol withdraw. Dr Adonis Huguenin desires that he remain on vent at least through 02/26 due to concern about stability of surgical wound.   MAJOR EVENTS/TEST RESULTS: 02/23 Admission 02/23 IM consult 02/25 Laparotomy, strangulated small bowel, small bowel resection  INDWELLING DEVICES:: ETT 02/25 >>   MICRO DATA: Urine 02/23 >> NEG MRSA PCR 02/25 >> NEG  ANTIMICROBIALS:  Ertapenem 02/25 >>    HISTORY OF PRESENT ILLNESS:   Remains intubated,sedated. Going through ETOH withdrawal Critically ill, prognosis is gaurded    VITAL SIGNS: BP 120/73 mmHg  Pulse 92  Temp(Src) 101.8 F (38.8 C) (Axillary)  Resp 19  Ht 5\' 8"  (1.727 m)  Wt 169 lb 3.2 oz (76.749 kg)  BMI 25.73 kg/m2  SpO2 97%  HEMODYNAMICS:    VENTILATOR SETTINGS: Vent Mode:  [-] PSV FiO2 (%):  [40 %] 40 % PEEP:  [5 cmH20] 5 cmH20 Pressure Support:  [19 cmH20] 19 cmH20 Plateau Pressure:  [18 cmH20] 18 cmH20  INTAKE / OUTPUT: I/O last 3 completed shifts: In: [id number] [I.V.:4414; NG/GT:30; IV Piggyback:50] Out: [id number] [Urine:2575; Emesis/NG output:200; Drains:565]  PHYSICAL EXAMINATION: General: appears older than chronological age, intubated, RASS -3 Neuro: CNs intact, MAEs HEENT: NCAT Cardiovascular: reg, no M Lungs: no wheezes, no adventitious sounds Abdomen: post op changes, abd soft, diminished to absent BS Ext: warm, no edema  LABS:  BMET  Recent Labs Lab 05/20/15 0556 05/21/15 0737 05/22/15 0522  NA 141 145 151*  K 3.5 3.6 3.7  CL 112* 115* 116*  CO2 22 23 29   BUN 19 30* 36*   CREATININE 0.96 1.12 1.20  GLUCOSE 143* 142* 118*    Electrolytes  Recent Labs Lab 05/18/15 0423  05/20/15 0556 05/21/15 0737 05/22/15 0522  CALCIUM 8.7*  < > 8.4* 7.9* 8.1*  MG 2.0  --   --  2.2  --   PHOS 4.2  --   --  3.2  --   < > = values in this interval not displayed.  CBC  Recent Labs Lab 05/20/15 0556 05/21/15 0737 05/22/15 0522  WBC 16.0* 6.1 5.7  HGB 16.7 14.9 14.1  HCT 49.8 44.7 42.4  PLT 151 139* 116*    Coag's  Recent Labs Lab 05/17/15 2151 05/18/15 0423  APTT 29 29  INR 1.01 1.13    Sepsis Markers No results for input(s): LATICACIDVEN, PROCALCITON, O2SATVEN in the last 168 hours.  ABG  Recent Labs Lab 05/19/15 0915 05/20/15 1800  PHART 7.47* 7.31*  PCO2ART 32 52*  PO2ART 53* 74*    Liver Enzymes  Recent Labs Lab 05/17/15 2151 05/18/15 0423 05/22/15 0522  AST 19 13* 64*  ALT 16* 13* 41  ALKPHOS 58 55 28*  BILITOT 1.1 1.0 0.8  ALBUMIN 4.1 3.8 1.9*    Cardiac Enzymes No results for input(s): TROPONINI, PROBNP in the last 168 hours.  Glucose  Recent Labs Lab 05/19/15 0747 05/19/15 1141 05/19/15 1703 05/20/15 1758  GLUCAP 164* 198* 174* 137*    CXR: Minimal RLL atx   ASSESSMENT / PLAN: 70 yo white male with acute s/p laparotomy  for acute small bowel infarction complicated by acute resp failure and metabolic encephalopathy and acute etoh withdrawal requiring vent support  PULMONARY A: Post op vent status  Heavy smoker without prior dx of COPD, no overt airflow obstruction P:   Cont vent support - settings reviewed and/or adjusted Work in PSV mode as tolerated Cont vent bundle Daily SBT if/when meets criteria Cont nebulized steroids and bronchodilators -remains encephalopathic  CARDIOVASCULAR A:  Transient post op hypotension - resolved P:  MAP goal > 65 mmHg  RENAL A:   -hypernatremia Borderline hypokalemia P:   Monitor BMET intermittently Monitor I/Os Correct electrolytes as indicated Cont LR  @ 75 cc/hr-consider changing to D5W  GASTROINTESTINAL A:   Post laparotomy P:   SUP: IV PPI Nutrition timing and route to be determined by surgery  HEMATOLOGIC A:   Very mild thrombocytopenia P:  DVT px: LMWH Monitor CBC intermittently Transfuse per usual ICU guidelines  INFECTIOUS A:   Post laparotomy, small bowel infarction P:   Monitor temp, WBC count Micro and abx as above  ENDOCRINE A:   Mild hyperglycemia without prior dx of DM P:   Monitor glu on chem panels Consider SSI for glu > 180  NEUROLOGIC A:   Heavy EtOH use Post op pain P:   RASS goal: -2, -3 Cont Precedex infusion Initiate fentanyl infusion 02/26 PRN lorazepam-will start scheduled IV valium  FAMILY  No family @ bedside  The Patient requires high complexity decision making for assessment and support, frequent evaluation and titration of therapies, application of advanced monitoring technologies and extensive interpretation of multiple databases. Critical Care Time devoted to patient care services described in this note is 40 minutes.   Overall, patient is critically ill, prognosis is guarded.  Patient with Multiorgan failure and at high risk for cardiac arrest and death.    Corrin Parker, M.D.  [facility]  Medical Director [place] Director [place] Department        05/22/2015, 8:25 AM

## 2015-05-23 ENCOUNTER — Inpatient Hospital Stay (HOSPITAL_COMMUNITY)
Admit: 2015-05-23 | Discharge: 2015-05-23 | Disposition: A | Discharge status: Home / Self Care | Payer: Medicare PPO | Attending: Specialist | Admitting: Specialist

## 2015-05-23 DIAGNOSIS — I4891 Unspecified atrial fibrillation: Secondary | ICD-10-CM

## 2015-05-23 DIAGNOSIS — G9340 Encephalopathy, unspecified: Secondary | ICD-10-CM

## 2015-05-23 DIAGNOSIS — G928 Other toxic encephalopathy: Secondary | ICD-10-CM

## 2015-05-23 DIAGNOSIS — F1010 Alcohol abuse, uncomplicated: Secondary | ICD-10-CM

## 2015-05-23 DIAGNOSIS — J9600 Acute respiratory failure, unspecified whether with hypoxia or hypercapnia: Secondary | ICD-10-CM

## 2015-05-23 LAB — MAGNESIUM: Magnesium: 2.5 mg/dL — ABNORMAL HIGH (ref 1.7–2.4)

## 2015-05-23 LAB — CBC
HEMATOCRIT: 38.5 % — AB (ref 40.0–52.0)
HEMOGLOBIN: 12.7 g/dL — AB (ref 13.0–18.0)
MCH: 32 pg (ref 26.0–34.0)
MCHC: 33 g/dL (ref 32.0–36.0)
MCV: 97 fL (ref 80.0–100.0)
Platelets: 104 10*3/uL — ABNORMAL LOW (ref 150–440)
RBC: 3.96 MIL/uL — AB (ref 4.40–5.90)
RDW: 14.4 % (ref 11.5–14.5)
WBC: 6.8 10*3/uL (ref 3.8–10.6)

## 2015-05-23 LAB — GLUCOSE, CAPILLARY
GLUCOSE-CAPILLARY: 124 mg/dL — AB (ref 65–99)
Glucose-Capillary: 112 mg/dL — ABNORMAL HIGH (ref 65–99)
Glucose-Capillary: 116 mg/dL — ABNORMAL HIGH (ref 65–99)
Glucose-Capillary: 131 mg/dL — ABNORMAL HIGH (ref 65–99)
Glucose-Capillary: 135 mg/dL — ABNORMAL HIGH (ref 65–99)
Glucose-Capillary: 153 mg/dL — ABNORMAL HIGH (ref 65–99)
Glucose-Capillary: 160 mg/dL — ABNORMAL HIGH (ref 65–99)

## 2015-05-23 LAB — COMPREHENSIVE METABOLIC PANEL
ALK PHOS: 37 U/L — AB (ref 38–126)
ALT: 55 U/L (ref 17–63)
AST: 47 U/L — AB (ref 15–41)
Albumin: 1.8 g/dL — ABNORMAL LOW (ref 3.5–5.0)
Anion gap: 3 — ABNORMAL LOW (ref 5–15)
BUN: 36 mg/dL — AB (ref 6–20)
CALCIUM: 7.7 mg/dL — AB (ref 8.9–10.3)
CHLORIDE: 117 mmol/L — AB (ref 101–111)
CO2: 31 mmol/L (ref 22–32)
CREATININE: 0.96 mg/dL (ref 0.61–1.24)
GFR calc non Af Amer: >60 mL/min (ref >60)
GLUCOSE: 114 mg/dL — AB (ref 65–99)
Potassium: 3.9 mmol/L (ref 3.5–5.1)
SODIUM: 151 mmol/L — AB (ref 135–145)
Total Bilirubin: 0.9 mg/dL (ref 0.3–1.2)
Total Protein: 4.9 g/dL — ABNORMAL LOW (ref 6.5–8.1)

## 2015-05-23 LAB — URINE CULTURE: Culture: NO GROWTH

## 2015-05-23 LAB — PHOSPHORUS: PHOSPHORUS: 3.8 mg/dL (ref 2.5–4.6)

## 2015-05-23 LAB — TRIGLYCERIDES: Triglycerides: 81 mg/dL (ref <150)

## 2015-05-23 MED ORDER — TRACE MINERALS CR-CU-MN-SE-ZN 10-1000-500-60 MCG/ML IV SOLN
INTRAVENOUS | Status: AC
  Administered 2015-05-23: 18:00:00 via INTRAVENOUS
  Filled 2015-05-23: qty 960

## 2015-05-23 NOTE — Consult Note (Signed)
PULMONARY / CRITICAL CARE MEDICINE   Name: Brady Smith MRN: [id number] DOB: April 11, 1945    ADMISSION DATE:  05/17/2015 CONSULTATION DATE:  02/26  REFERRING MD:  Adonis Huguenin  PT PROFILE: 43 M with history of heavy EtOH and heavy smoking history admitted early 02/23 with incarcerated ventral hernia and SBO. Underwent laparotomy 02/25 and was noted to have a difficult airway. Prior to surgery, there was concern for alcohol withdraw. Dr Adonis Huguenin desires that he remain on vent at least through 02/26 due to concern about stability of surgical wound.   MAJOR EVENTS/TEST RESULTS: VENT DAY #5 02/23 Admission 02/23 IM consult 02/25 Laparotomy, strangulated small bowel, small bowel resection  INDWELLING DEVICES:: ETT 02/25 >>   MICRO DATA: Urine 02/23 >> NEG MRSA PCR 02/25 >> NEG  ANTIMICROBIALS:  Ertapenem 02/25 >>    HISTORY OF PRESENT ILLNESS:   Remains intubated,sedated. Going through ETOH withdrawal Critically ill, prognosis is gaurded-will attempt SAT    VITAL SIGNS: BP 138/75 mmHg  Pulse 95  Temp(Src) 100.2 F (37.9 C) (Rectal)  Resp 14  Ht 5\' 8"  (1.727 m)  Wt 168 lb 14.4 oz (76.613 kg)  BMI 25.69 kg/m2  SpO2 97%  HEMODYNAMICS:    VENTILATOR SETTINGS: Vent Mode:  [-] PRVC FiO2 (%):  [40 %] 40 % Set Rate:  [14 bmp] 14 bmp Vt Set:  [500 mL] 500 mL PEEP:  [5 cmH20] 5 cmH20 Pressure Support:  [14 cmH20] 14 cmH20  INTAKE / OUTPUT: I/O last 3 completed shifts: In: 4159.5 [I.V.:3550.8; Other:40; IV Piggyback:50] Out: 4200 [Urine:3665; Emesis/NG output:250; Drains:285]  PHYSICAL EXAMINATION: General: appears older than chronological age, intubated, RASS -3 Neuro: CNs intact, MAEs HEENT: NCAT Cardiovascular: reg, no M Lungs: no wheezes, no adventitious sounds Abdomen: post op changes, abd soft, diminished to absent BS Ext: warm, no edema  LABS:  BMET  Recent Labs Lab 05/21/15 0737 05/22/15 0522 05/23/15 0614  NA 145 151* 151*  K 3.6 3.7 3.9  CL 115*  116* 117*  CO2 23 29 31   BUN 30* 36* 36*  CREATININE 1.12 1.20 0.96  GLUCOSE 142* 118* 114*    Electrolytes  Recent Labs Lab 05/21/15 0737 05/22/15 0522 05/22/15 0524 05/23/15 0614  CALCIUM 7.9* 8.1*  --  7.7*  MG 2.2  --  2.4 2.5*  PHOS 3.2  --  3.1 3.8    CBC  Recent Labs Lab 05/21/15 0737 05/22/15 0522 05/23/15 0614  WBC 6.1 5.7 6.8  HGB 14.9 14.1 12.7*  HCT 44.7 42.4 38.5*  PLT 139* 116* 104*    Coag's  Recent Labs Lab 05/17/15 2151 05/18/15 0423  APTT 29 29  INR 1.01 1.13    Sepsis Markers No results for input(s): LATICACIDVEN, PROCALCITON, O2SATVEN in the last 168 hours.  ABG  Recent Labs Lab 05/19/15 0915 05/20/15 1800 05/22/15 1100  PHART 7.47* 7.31* 7.43  PCO2ART 32 52* 31*  PO2ART 53* 74* 107    Liver Enzymes  Recent Labs Lab 05/18/15 0423 05/22/15 0522 05/23/15 0614  AST 13* 64* 47*  ALT 13* 41 55  ALKPHOS 55 28* 37*  BILITOT 1.0 0.8 0.9  ALBUMIN 3.8 1.9* 1.8*    Cardiac Enzymes No results for input(s): TROPONINI, PROBNP in the last 168 hours.  Glucose  Recent Labs Lab 05/22/15 1751 05/22/15 1943 05/22/15 2354 05/23/15 0317 05/23/15 0624 05/23/15 0728  GLUCAP 111* 125* 120* 124* 116* 112*    CXR: Minimal RLL atx   ASSESSMENT / PLAN: 69 yo white male with  acute s/p laparotomy for acute small bowel infarction complicated by acute resp failure and metabolic encephalopathy and acute etoh withdrawal requiring vent support  PULMONARY A: Post op vent status  Heavy smoker without prior dx of COPD, no overt airflow obstruction P:   Cont vent support - settings reviewed and/or adjusted Work in PSV mode as tolerated Cont vent bundle Daily SBT if/when meets criteria Cont nebulized steroids and bronchodilators -remains encephalopathic-will wean sedation and assess neuro status  CARDIOVASCULAR A:  Transient post op hypotension - resolved P:  MAP goal > 65 mmHg  RENAL A:   -hypernatremia Borderline  hypokalemia P:   Monitor BMET intermittently Monitor I/Os Correct electrolytes as indicated changed to d5w yesterday NA=remains  151  GASTROINTESTINAL A:   Post laparotomy P:   SUP: IV PPI Started TPN, picc line placed  HEMATOLOGIC A:   Very mild thrombocytopenia P:  DVT px: LMWH Monitor CBC intermittently Transfuse per usual ICU guidelines  INFECTIOUS A:   Post laparotomy, small bowel infarction P:   Monitor temp, WBC count Micro and abx as above  ENDOCRINE A:   Mild hyperglycemia without prior dx of DM P:   Monitor glu on chem panels Consider SSI for glu > 180  NEUROLOGIC A:   Heavy EtOH use Post op pain P:   RASS goal: -2, -3 Cont Precedex infusion Initiate fentanyl infusion 02/26 Continue scheduled IV valium  FAMILY  No family @ bedside  The Patient requires high complexity decision making for assessment and support, frequent evaluation and titration of therapies, application of advanced monitoring technologies and extensive interpretation of multiple databases. Critical Care Time devoted to patient care services described in this note is 40 minutes.   Overall, patient is critically ill, prognosis is guarded.  Patient with Multiorgan failure and at high risk for cardiac arrest and death.    Corrin Parker, M.D.  [facility]  Medical Director [place] Director [place] Department        05/23/2015, 8:30 AM

## 2015-05-23 NOTE — Progress Notes (Signed)
*  PRELIMINARY RESULTS* Echocardiogram 2D Echocardiogram has been performed.  Brady Smith 05/23/2015, 2:28 PM

## 2015-05-23 NOTE — Progress Notes (Signed)
Nutrition Follow-up    INTERVENTION:   PN: pt hypernatremic with sodium >150, on D5 infusion to correct. With current electrolyte imbalances, do not recommend increasing TPN rate. Discussed during ICU rounds, MD Kasa agrees. Continue 5%AA/20%Dextrose at rate of 40 ml/hr at present time, continue to assess   NUTRITION DIAGNOSIS:   Inadequate oral intake related to acute illness, altered GI function as evidenced by NPO status.  GOAL:   Provide needs based on ASPEN/SCCM guidelines  MONITOR:    (PN, Energy Intake, Anthropometrics, Electrolyte/Renal Profile, Glucose Profile)  REASON FOR ASSESSMENT:   Ventilator, Consult New TPN/TNA  ASSESSMENT:    Pt remains on vent  Diet Order:  .TPN (CLINIMIX-E) Adult, NPO  PN: 5%AA/20%Dextrose at rate of 40 ml/hr via right double lumen PICC  Digestive System: NG with minimal output, abdomen soft, no flatus, no BM, faint BS   Recent Labs Lab 05/21/15 0737 05/22/15 0522 05/22/15 0524 05/23/15 0614  NA 145 151*  --  151*  K 3.6 3.7  --  3.9  CL 115* 116*  --  117*  CO2 23 29  --  31  BUN 30* 36*  --  36*  CREATININE 1.12 1.20  --  0.96  CALCIUM 7.9* 8.1*  --  7.7*  MG 2.2  --  2.4 2.5*  PHOS 3.2  --  3.1 3.8  GLUCOSE 142* 118*  --  114*  Corrected calcium: 9.46 (albumin 1.8)   Glucose Profile:  Recent Labs  05/23/15 0317 05/23/15 0624 05/23/15 0728  GLUCAP 124* 116* 112*   Meds: ss novolog, D5 at 100 ml/hr   Height:   Ht Readings from Last 1 Encounters:  05/18/15 5\' 8"  (1.727 m)    Weight:   Wt Readings from Last 1 Encounters:  05/23/15 168 lb 14.4 oz (76.613 kg)   BMI:  Body mass index is 25.69 kg/(m^2).  Estimated Nutritional Needs:   Kcal:  2138 kcals (Ve: 13.8, tmax: 38.8) using wt of 76.8 kg  Protein:  116-154 g(1.5-2.0 g/kg)   Fluid:  1925-2310 mL (25-30 ml/kg)   HIGH Care Level  Kerman Passey MS, RD, LDN [phone] Pager  [phone] Weekend/On-Call Pager

## 2015-05-23 NOTE — Progress Notes (Signed)
[facility] [place] NAME: Brady Smith    MR#:  [id number]  DATE OF BIRTH:  1945/06/20  SUBJECTIVE:   Patient is here due to an incarcerated ventral hernia. Status post strangulated ventral hernia repair POD # 3. Remains intubated and sedated and tried on SBT this a.m. But did not tolerate it and therefore not ready for extubation. Remains in A. Fib but rate controlled.   REVIEW OF SYSTEMS:    Review of Systems  Unable to perform ROS: dementia    Nutrition: NPO Tolerating Diet: No Tolerating PT: Await Eval.     DRUG ALLERGIES:  No Known Allergies  VITALS:  Blood pressure 98/69, pulse 94, temperature 100.8 F (38.2 C), temperature source Rectal, resp. rate 14, height 5\' 8"  (1.727 m), weight 76.613 kg (168 lb 14.4 oz), SpO2 97 %.  PHYSICAL EXAMINATION:   Physical Exam  GENERAL:  70 y.o.-year-old patient lying in the bed intubated & sedated.   EYES: Pupils equal, round, reactive to light. No scleral icterus.  HEENT: Head atraumatic, normocephalic.  ET tube in place.  NECK:  Supple, no jugular venous distention. No thyroid enlargement, no tenderness.  LUNGS: Normal breath sounds bilaterally, no wheezing, rales, rhonchi. No use of accessory muscles of respiration.  CARDIOVASCULAR: S1, S2 Irregular. No murmurs, rubs, or gallops.  ABDOMEN: Soft, nondistended. Hypoactive bowel sounds. Positive mid abdominal staples in place. Bilateral JP drains in place. EXTREMITIES: No cyanosis, clubbing or edema b/l.    NEUROLOGIC: Sedated and intubated. PSYCHIATRIC: Sedated and intubated.  SKIN: No obvious rash, lesion, or ulcer.    LABORATORY PANEL:   CBC  Recent Labs Lab 05/23/15 0614  WBC 6.8  HGB 12.7*  HCT 38.5*  PLT 104*   ------------------------------------------------------------------------------------------------------------------  Chemistries   Recent Labs Lab 05/23/15 0614  NA 151*  K 3.9  CL 117*  CO2 31   GLUCOSE 114*  BUN 36*  CREATININE 0.96  CALCIUM 7.7*  MG 2.5*  AST 47*  ALT 55  ALKPHOS 37*  BILITOT 0.9   ------------------------------------------------------------------------------------------------------------------  Cardiac Enzymes No results for input(s): TROPONINI in the last 168 hours. ------------------------------------------------------------------------------------------------------------------  RADIOLOGY:  Dg Chest 1 View  05/22/2015  CLINICAL DATA:  Line placement. EXAM: CHEST 1 VIEW COMPARISON:  Earlier today. FINDINGS: 1742 hours. Endotracheal tube terminates 4.7 cm above carina.Nasogastric tube terminates at the gastric body. A right-sided PICC line is new, and terminates at the low SVC versus cavoatrial junction. Midline trachea. Normal heart size for level of inspiration. No pleural effusion or pneumothorax. Low lung volumes with improved bibasilar airspace disease. IMPRESSION: Right-sided PICC line terminates at the low SVC versus superior caval/atrial junction. No pneumothorax. Improved bibasilar aeration, with mild atelectasis remaining. Electronically Signed   By: Abigail Miyamoto M.D.   On: 05/22/2015 17:59   Dg Chest Port 1 View  05/22/2015  CLINICAL DATA:  Respiratory failure EXAM: PORTABLE CHEST 1 VIEW COMPARISON:  05/21/2015 FINDINGS: Endotracheal tube and NG tube are unchanged. Stable cardiac silhouette. There is increase in bibasilar opacities. Upper lungs are clear. IMPRESSION: 1. Stable support apparatus. 2. Increased bibasilar linear opacities representing atelectasis versus infiltrate. Electronically Signed   By: Suzy Bouchard M.D.   On: 05/22/2015 07:28     ASSESSMENT AND PLAN:   70 year old male with past medical history of bladder cancer, pneumonia, alcohol abuse, hypertension who presented to the hospital due to abdominal pain and noted to have a incarcerated ventral hernia.  #1 incarcerated ventral hernia-patient is  status post Laparotomy and  removal of necrotic bowel and incarcerated hernia repair. POD # 3.  - cont. Care as per Gen. Surgery. Remains off enteric feeds for now.    #2 alcohol withdrawal-Patient will likely need to be in CIWA protocol once extubated. - cont. Fentanyl gtt for now for sedation.    #3 acute respiratory failure-patient was intubated after surgery on 2/25. -Currently only at 40% FiO2 and stable pulmonary is following the patient and they attempted a SBT today but patient failed and therefore they will reattempt tomorrow.  - cont. Care as per Pulm.   #4 atrial fibrillation-remains in atrial fibrillation but rate controlled. -Echo done and results pending and will follow.  -Hold on long long-term anticoagulation given the fact the patient recent surgery.  #5 fever of unknown origin-patient had a fever of 101 two days ago. He has had blood, urine cultures and sputum culture which are (-) so far.  -Continue empiric ertapenem. Afebrile overnight. Off vasopressors.  #6 history of COPD-continue duo nebs when necessary. - cont. pulmicort nebs.   #7 hypernatremia-cont. D5W and follow sodium.   All the records are reviewed and case discussed with Care Management/Social Workerr. Management plans discussed with the patient, family and they are in agreement.  CODE STATUS: Full Code  DVT Prophylaxis: Lovenox  TOTAL Critical Care TIME TAKING CARE OF THIS PATIENT: 30 minutes.   POSSIBLE D/C unclear and DEPENDING ON CLINICAL CONDITION.   Henreitta Leber M.D on 05/23/2015 at 3:42 PM  Between 7am to 6pm - Pager - [phone]  After 6pm go to [email or web address] - password [email or web address] [facility]  Office  [phone]  CC: Primary care physician; Halina Maidens, MD

## 2015-05-23 NOTE — Progress Notes (Signed)
CC: POD # 3 SB resection and ventral hernia repair   Subjective: Patient had an episode of agitation last night currently Morcom. Only getting fentanyl drip. TPN was started. Spiked a temperature yesterday  Objective: Vital signs in last 24 hours: Temp:  [99.7 F (37.6 C)-102.3 F (39.1 C)] 100.2 F (37.9 C) (02/28 0800) Pulse Rate:  [54-124] 95 (02/28 0800) Resp:  [8-21] 14 (02/28 0800) BP: (91-143)/(62-85) 138/75 mmHg (02/28 0800) SpO2:  [92 %-99 %] 95 % (02/28 0850) FiO2 (%):  [40 %] 40 % (02/28 0850) Weight:  [76.476 kg (168 lb 9.6 oz)-76.613 kg (168 lb 14.4 oz)] 76.613 kg (168 lb 14.4 oz) (02/28 0500)    Intake/Output from previous day: 02/27 0701 - 02/28 0700 In: 2864.4 [I.V.:2255.7; IV Piggyback:50; TPN:518.7] Out: 2800 [Urine:2365; Emesis/NG output:250; Drains:185] Intake/Output this shift: Total I/O In: 132.5 [I.V.:92.5; TPN:40] Out: 315 [Urine:300; Drains:15]  Physical exam:  Patient is sedated however he follows some commands Abdomen: Incision clean dry and intact the erythema on the abdominal wall has improved as compared to yesterday. JPs with serosanguineous fluid. Otherwise soft  Lab Results: CBC   Recent Labs  05/22/15 0522 05/23/15 0614  WBC 5.7 6.8  HGB 14.1 12.7*  HCT 42.4 38.5*  PLT 116* 104*   BMET  Recent Labs  05/22/15 0522 05/23/15 0614  NA 151* 151*  K 3.7 3.9  CL 116* 117*  CO2 29 31  GLUCOSE 118* 114*  BUN 36* 36*  CREATININE 1.20 0.96  CALCIUM 8.1* 7.7*   PT/INR No results for input(s): LABPROT, INR in the last 72 hours. ABG  Recent Labs  05/20/15 1800 05/22/15 1100  PHART 7.31* 7.43  HCO3 26.2 20.6*    Studies/Results: Dg Chest 1 View  05/22/2015  CLINICAL DATA:  Line placement. EXAM: CHEST 1 VIEW COMPARISON:  Earlier today. FINDINGS: 1742 hours. Endotracheal tube terminates 4.7 cm above carina.Nasogastric tube terminates at the gastric body. A right-sided PICC line is new, and terminates at the low SVC versus  cavoatrial junction. Midline trachea. Normal heart size for level of inspiration. No pleural effusion or pneumothorax. Low lung volumes with improved bibasilar airspace disease. IMPRESSION: Right-sided PICC line terminates at the low SVC versus superior caval/atrial junction. No pneumothorax. Improved bibasilar aeration, with mild atelectasis remaining. Electronically Signed   By: Abigail Miyamoto M.D.   On: 05/22/2015 17:59   Dg Chest Port 1 View  05/22/2015  CLINICAL DATA:  Respiratory failure EXAM: PORTABLE CHEST 1 VIEW COMPARISON:  05/21/2015 FINDINGS: Endotracheal tube and NG tube are unchanged. Stable cardiac silhouette. There is increase in bibasilar opacities. Upper lungs are clear. IMPRESSION: 1. Stable support apparatus. 2. Increased bibasilar linear opacities representing atelectasis versus infiltrate. Electronically Signed   By: Suzy Bouchard M.D.   On: 05/22/2015 07:28    Anti-infectives: Anti-infectives    Start     Dose/Rate Route Frequency Ordered Stop   05/22/15 1800  ertapenem (INVANZ) 1 g in sodium chloride 0.9 % 50 mL IVPB     1 g 100 mL/hr over 30 Minutes Intravenous Every 24 hours 05/22/15 1022     05/21/15 1500  ertapenem (INVANZ) 1 g in sodium chloride 0.9 % 50 mL IVPB  Status:  Discontinued     1 g 100 mL/hr over 30 Minutes Intravenous Every 24 hours 05/20/15 1807 05/22/15 1022   05/20/15 1445  ertapenem (INVANZ) 1 g in sodium chloride 0.9 % 50 mL IVPB     1 g 100 mL/hr over 30 Minutes Intravenous  Once 05/20/15 1436     05/20/15 1415  ertapenem (INVANZ) 0.25 g in sodium chloride 0.9 % 50 mL IVPB     250 mg 100 mL/hr over 30 Minutes Intravenous  Once 05/20/15 1411        Assessment/Plan: Pt is  post laparotomy, bowel resection and ventral hernia repair in a patient with COPD and active DTs. Difficult situation. I appreciate critical care team. We will let the critical care team managed weaning off the ventilator as tolerated. As well as his mental status will allow  it. He is inappropriate antibiotic coverage. Do not recommend any further imaging from abdominal perspective. Continue NG tube and TPN for now. Depending on ileus may en ascertained doing trophic feeds to starting tomorrow. We will keep an eye on platelets   Caroleen Hamman, MD, [facility]  05/23/2015

## 2015-05-23 NOTE — Progress Notes (Signed)
Chaplain rounded the unit and provided a compassionate presence, support and silent prayer.. Chaplain Madalena Kesecker [phone] 

## 2015-05-23 NOTE — Progress Notes (Signed)
Pt. Getting an echo during ventilator assessment.

## 2015-05-23 NOTE — Progress Notes (Signed)
Pt in PSV mode, RR 7, increased ETCo2 42, unable to arouse pt, apnea alarm. Placed pt back on PRVC, vt 500, rr 14, FIO2 40, peep 5.

## 2015-05-23 NOTE — Progress Notes (Addendum)
[facility] NOTE   Pharmacy Consult for electrolytes and insulin management Indication: ICU patient requiring mechanical ventilation and TPN  No Known Allergies  Patient Measurements: Height: 5\' 8"  (172.7 cm) Weight: 168 lb 14.4 oz (76.613 kg) IBW/kg (Calculated) : 68.4   Vital Signs: Temp: 100.8 F (38.2 C) (02/28 1400) Temp Source: Rectal (02/28 0800) BP: 98/69 mmHg (02/28 1400) Pulse Rate: 94 (02/28 1400) Intake/Output from previous day: 02/27 0701 - 02/28 0700 In: 2864.4 [I.V.:2255.7; IV Piggyback:50; TPN:518.7] Out: 2800 [Urine:2365; Emesis/NG output:250; Drains:185] Intake/Output from this shift: Total I/O In: 899.6 [I.V.:659.6; TPN:240] Out: 865 [Urine:800; Drains:65]  Labs:  Recent Labs  05/21/15 0737 05/22/15 0522 05/23/15 0614  WBC 6.1 5.7 6.8  HGB 14.9 14.1 12.7*  HCT 44.7 42.4 38.5*  PLT 139* 116* 104*     Recent Labs  05/21/15 0737 05/22/15 0522 05/22/15 0524 05/23/15 0614  NA 145 151*  --  151*  K 3.6 3.7  --  3.9  CL 115* 116*  --  117*  CO2 23 29  --  31  GLUCOSE 142* 118*  --  114*  BUN 30* 36*  --  36*  CREATININE 1.12 1.20  --  0.96  CALCIUM 7.9* 8.1*  --  7.7*  MG 2.2  --  2.4 2.5*  PHOS 3.2  --  3.1 3.8  PROT  --  5.2*  --  4.9*  ALBUMIN  --  1.9*  --  1.8*  AST  --  64*  --  47*  ALT  --  41  --  55  ALKPHOS  --  28*  --  37*  BILITOT  --  0.8  --  0.9  TRIG  --   --   --  81   Estimated Creatinine Clearance: 70.3 mL/min (by C-G formula based on Cr of 0.96).    Recent Labs  05/23/15 0624 05/23/15 0728 05/23/15 1145  GLUCAP 116* 112* 160*    Medical History: Past Medical History  Diagnosis Date  . History of kidney cancer   . Bladder cancer ([place])   . Pneumonia     Medications:  Scheduled:  . antiseptic oral rinse  7 mL Mouth Rinse QID  . budesonide (PULMICORT) nebulizer solution  0.5 mg Nebulization BID  . chlorhexidine gluconate  15 mL Mouth Rinse BID  . diazepam  5 mg Intravenous Q6H  .  enoxaparin (LOVENOX) injection  40 mg Subcutaneous Q24H  . ertapenem (INVANZ) IV  250 mg Intravenous Once  . ertapenem (INVANZ) IV  1 g Intravenous Once  . ertapenem  1 g Intravenous Q24H  . insulin aspart  2-6 Units Subcutaneous 6 times per day  . ipratropium-albuterol  3 mL Nebulization Q6H  . pantoprazole (PROTONIX) IV  40 mg Intravenous Q24H  . thiamine  100 mg Intravenous Daily   Infusions:  . Marland KitchenTPN (CLINIMIX-E) Adult 40 mL/hr at 05/23/15 1300  . Marland KitchenTPN (CLINIMIX-E) Adult    . dextrose 100 mL/hr at 05/23/15 1300  . fentaNYL infusion INTRAVENOUS 200 mcg/hr (05/23/15 1300)    Insulin Requirements in the past 24 hours:  8  Current Nutrition:  TPN 5/20 at 29mL/hr   Plan:  Will continue patient on SSI Q4hr while patient is on TPN. Will obtain electrolytes with am labs.   Pharmacy will continue to monitor and adjust per consult.    Keidrick Murty L 05/23/2015,3:42 PM

## 2015-05-24 ENCOUNTER — Inpatient Hospital Stay: Payer: Medicare PPO

## 2015-05-24 DIAGNOSIS — G9340 Encephalopathy, unspecified: Secondary | ICD-10-CM

## 2015-05-24 LAB — BLOOD GAS, ARTERIAL
ACID-BASE EXCESS: 4.8 mmol/L — AB (ref 0.0–3.0)
ALLENS TEST (PASS/FAIL): POSITIVE — AB
Bicarbonate: 30.4 mEq/L — ABNORMAL HIGH (ref 21.0–28.0)
FIO2: 0.4
O2 SAT: 93.7 %
PEEP/CPAP: 5 cmH2O
PO2 ART: 69 mmHg — AB (ref 83.0–108.0)
PRESSURE SUPPORT: 5 cmH2O
Patient temperature: 37
pCO2 arterial: 48 mmHg (ref 32.0–48.0)
pH, Arterial: 7.41 (ref 7.350–7.450)

## 2015-05-24 LAB — BASIC METABOLIC PANEL
Anion gap: 3 — ABNORMAL LOW (ref 5–15)
BUN: 29 mg/dL — AB (ref 6–20)
CHLORIDE: 111 mmol/L (ref 101–111)
CO2: 32 mmol/L (ref 22–32)
Calcium: 7.7 mg/dL — ABNORMAL LOW (ref 8.9–10.3)
Creatinine, Ser: 0.96 mg/dL (ref 0.61–1.24)
GFR calc non Af Amer: >60 mL/min (ref >60)
Glucose, Bld: 145 mg/dL — ABNORMAL HIGH (ref 65–99)
POTASSIUM: 4 mmol/L (ref 3.5–5.1)
SODIUM: 146 mmol/L — AB (ref 135–145)

## 2015-05-24 LAB — CBC
HEMATOCRIT: 37.8 % — AB (ref 40.0–52.0)
HEMOGLOBIN: 12.5 g/dL — AB (ref 13.0–18.0)
MCH: 32.5 pg (ref 26.0–34.0)
MCHC: 33.1 g/dL (ref 32.0–36.0)
MCV: 98.3 fL (ref 80.0–100.0)
Platelets: 96 10*3/uL — ABNORMAL LOW (ref 150–440)
RBC: 3.85 MIL/uL — AB (ref 4.40–5.90)
RDW: 14 % (ref 11.5–14.5)
WBC: 5.9 10*3/uL (ref 3.8–10.6)

## 2015-05-24 LAB — GLUCOSE, CAPILLARY
GLUCOSE-CAPILLARY: 133 mg/dL — AB (ref 65–99)
GLUCOSE-CAPILLARY: 125 mg/dL — AB (ref 65–99)
GLUCOSE-CAPILLARY: 148 mg/dL — AB (ref 65–99)
Glucose-Capillary: 130 mg/dL — ABNORMAL HIGH (ref 65–99)
Glucose-Capillary: 134 mg/dL — ABNORMAL HIGH (ref 65–99)

## 2015-05-24 LAB — PHOSPHORUS: Phosphorus: 3.8 mg/dL (ref 2.5–4.6)

## 2015-05-24 LAB — MAGNESIUM: MAGNESIUM: 2.5 mg/dL — AB (ref 1.7–2.4)

## 2015-05-24 LAB — SURGICAL PATHOLOGY

## 2015-05-24 MED ORDER — DEXMEDETOMIDINE HCL IN NACL 400 MCG/100ML IV SOLN
0.4000 ug/kg/h | INTRAVENOUS | Status: DC
  Administered 2015-05-24 (×2): 0.8 ug/kg/h via INTRAVENOUS
  Administered 2015-05-25: 0.9 ug/kg/h via INTRAVENOUS
  Administered 2015-05-25: 0.4 ug/kg/h via INTRAVENOUS
  Administered 2015-05-26 – 2015-05-28 (×4): 0.3 ug/kg/h via INTRAVENOUS
  Filled 2015-05-24 (×8): qty 100

## 2015-05-24 MED ORDER — TRACE MINERALS CR-CU-MN-SE-ZN 10-1000-500-60 MCG/ML IV SOLN
INTRAVENOUS | Status: AC
  Administered 2015-05-24: 18:00:00 via INTRAVENOUS
  Filled 2015-05-24: qty 960

## 2015-05-24 NOTE — Progress Notes (Signed)
Nutrition Follow-up    INTERVENTION:   PN: discussed nutritional poc during ICU rounds, sodium improved, MD Kasa requesting TPN remain at current rate of 40 ml/hr with no titration today as currently trying to wean pt. This was also discussed with MD Pabon. TPN to remain at 40 ml/hr at this time. Assess for titration to goal rate on follow-up   NUTRITION DIAGNOSIS:   Inadequate oral intake related to acute illness, altered GI function as evidenced by NPO status. Being addressed via TPN  GOAL:   Provide needs based on ASPEN/SCCM guidelines  MONITOR:    (PN, Energy Intake, Anthropometrics, Electrolyte/Renal Profile, Glucose Profile)  REASON FOR ASSESSMENT:   Ventilator, Consult New TPN/TNA  ASSESSMENT:    Pt remains on vent, pressure support trial this AM. POD 4 for SB resection for incarcerated ventral hernia  Diet Order:  .TPN (CLINIMIX-E) Adult .TPN (CLINIMIX-E) Adult  PN: 5%AA/20% Dextrose infusing at 40 ml/hr via right double lumen PICC  Digestive System: mild ileus postop per MD notes, NG to LIS, no flatus, no BM  Urine Volume: UOP 2150 mL in past 24 hours, 775 mL documented thus far today  Recent Labs Lab 05/22/15 0522 05/22/15 0524 05/23/15 0614 05/24/15 0455  NA 151*  --  151* 146*  K 3.7  --  3.9 4.0  CL 116*  --  117* 111  CO2 29  --  31 32  BUN 36*  --  36* 29*  CREATININE 1.20  --  0.96 0.96  CALCIUM 8.1*  --  7.7* 7.7*  MG  --  2.4 2.5* 2.5*  PHOS  --  3.1 3.8 3.8  GLUCOSE 118*  --  114* 145*    Glucose Profile:  Recent Labs  05/24/15 0437 05/24/15 0751 05/24/15 1122  GLUCAP 130* 133* 125*   Nutritional Anemia Profile:  CBC Latest Ref Rng 05/24/2015 05/23/2015 05/22/2015  WBC 3.8 - 10.6 K/uL 5.9 6.8 5.7  Hemoglobin 13.0 - 18.0 g/dL 12.5(L) 12.7(L) 14.1  Hematocrit 40.0 - 52.0 % 37.8(L) 38.5(L) 42.4  Platelets 150 - 440 K/uL 96(L) 104(L) 116(L)    Meds: D5 at 100 ml/hr, ss novolog  Height:   Ht Readings from Last 1 Encounters:   05/18/15 5\' 8"  (1.727 m)    Weight:   Wt Readings from Last 1 Encounters:  05/24/15 172 lb 13.5 oz (78.4 kg)    Filed Weights   05/23/15 0500 05/24/15 0523 05/24/15 0535  Weight: 168 lb 14.4 oz (76.613 kg) 171 lb 11.8 oz (77.9 kg) 172 lb 13.5 oz (78.4 kg)    BMI:  Body mass index is 26.29 kg/(m^2).  Estimated Nutritional Needs:   Kcal:  2138 kcals (Ve: 13.8, tmax: 38.8) using wt of 76.8 kg  Protein:  116-154 g(1.5-2.0 g/kg)   Fluid:  1925-2310 mL (25-30 ml/kg)   HIGH Care Level  Kerman Passey MS, RD, LDN [phone] Pager  [phone] Weekend/On-Call Pager

## 2015-05-24 NOTE — Progress Notes (Signed)
POD # 4 SB resection for incarcerated ventral hernia  VSS, tachy and febrile likely from DT He follows commands intermittently but had significant agitation Oxygenation is adequate  PE: Sedated in no acute distress Abdomen: Incision is clean dry and intact erythema and abdominal wall has significantly improved. Serous drainage from JPs no evidence of infection. Decreased bowel sounds  A/P Mild ileus postoperatively as expected. For now continue NG tube and continue TPN Today broad-spectrum antibiotics and supportive care with ventilator management and sedation. Appreciate critical care team care help Watch PL ( ? HIT vs portal HTN)

## 2015-05-24 NOTE — Progress Notes (Signed)
PARENTERAL NUTRITION CONSULT NOTE   Pharmacy Consult for electrolytes and insulin management Indication: ICU patient requiring mechanical ventilation and TPN  No Known Allergies  Patient Measurements: Height: 5\' 8"  (172.7 cm) Weight: 172 lb 13.5 oz (78.4 kg) IBW/kg (Calculated) : 68.4   Vital Signs: Temp: 100.2 F (37.9 C) (03/01 1500) Temp Source: Rectal (03/01 0800) BP: 153/91 mmHg (03/01 1500) Pulse Rate: 121 (03/01 1500) Intake/Output from previous day: 02/28 0701 - 03/01 0700 In: 3635.6 [I.V.:2699.6; TPN:926] Out: 2400 [Urine:2150; Emesis/NG output:100; Drains:150] Intake/Output from this shift: Total I/O In: 1048.2 [I.V.:651.5; TPN:396.7] Out: 865 [Urine:775; Drains:90]  Labs:  Recent Labs  05/22/15 0522 05/23/15 0614 05/24/15 0455  WBC 5.7 6.8 5.9  HGB 14.1 12.7* 12.5*  HCT 42.4 38.5* 37.8*  PLT 116* 104* 96*     Recent Labs  05/22/15 0522 05/22/15 0524 05/23/15 0614 05/24/15 0455  NA 151*  --  151* 146*  K 3.7  --  3.9 4.0  CL 116*  --  117* 111  CO2 29  --  31 32  GLUCOSE 118*  --  114* 145*  BUN 36*  --  36* 29*  CREATININE 1.20  --  0.96 0.96  CALCIUM 8.1*  --  7.7* 7.7*  MG  --  2.4 2.5* 2.5*  PHOS  --  3.1 3.8 3.8  PROT 5.2*  --  4.9*  --   ALBUMIN 1.9*  --  1.8*  --   AST 64*  --  47*  --   ALT 41  --  55  --   ALKPHOS 28*  --  37*  --   BILITOT 0.8  --  0.9  --   TRIG  --   --  81  --    Estimated Creatinine Clearance: 70.3 mL/min (by C-G formula based on Cr of 0.96).    Recent Labs  05/24/15 0437 05/24/15 0751 05/24/15 1122  GLUCAP 130* 133* 125*    Medical History: Past Medical History  Diagnosis Date  . History of kidney cancer   . Bladder cancer ([place])   . Pneumonia     Medications:  Scheduled:  . antiseptic oral rinse  7 mL Mouth Rinse QID  . budesonide (PULMICORT) nebulizer solution  0.5 mg Nebulization BID  . chlorhexidine gluconate  15 mL Mouth Rinse BID  . diazepam  5 mg Intravenous Q6H  . enoxaparin  (LOVENOX) injection  40 mg Subcutaneous Q24H  . ertapenem (INVANZ) IV  250 mg Intravenous Once  . ertapenem (INVANZ) IV  1 g Intravenous Once  . ertapenem  1 g Intravenous Q24H  . insulin aspart  2-6 Units Subcutaneous 6 times per day  . ipratropium-albuterol  3 mL Nebulization Q6H  . pantoprazole (PROTONIX) IV  40 mg Intravenous Q24H  . thiamine  100 mg Intravenous Daily   Infusions:  . Marland KitchenTPN (CLINIMIX-E) Adult 40 mL/hr at 05/24/15 1200  . Marland KitchenTPN (CLINIMIX-E) Adult    . dextrose 100 mL/hr at 05/24/15 1200  . fentaNYL infusion INTRAVENOUS Stopped (05/24/15 1022)    Insulin Requirements in the past 24 hours:  18  Current Nutrition:  TPN 5/20 at 73mL/hr   Plan:  Will continue patient on SSI Q4hr while patient is on TPN. Electrolytes are WNL except magnesium is slightly elevated. WIll f/u am labs.   Pharmacy will continue to monitor and adjust per consult.    Ulice Dash D 05/24/2015,3:11 PM

## 2015-05-24 NOTE — Progress Notes (Signed)
[facility] NAME: Brady Smith    MR#:  [id number]  DATE OF BIRTH:  October 30, 1945  SUBJECTIVE:   Patient is here due to an incarcerated ventral hernia. Status post strangulated ventral hernia repair POD # 4. Extubated this a.m. But remains encephalopathic.  Remains in A. Fib but rate controlled.   REVIEW OF SYSTEMS:    Review of Systems  Unable to perform ROS: dementia    Nutrition: NPO Tolerating Diet: No Tolerating PT: Await Eval.     DRUG ALLERGIES:  No Known Allergies  VITALS:  Blood pressure 153/91, pulse 121, temperature 100.2 F (37.9 C), temperature source Rectal, resp. rate 21, height 5\' 8"  (1.727 m), weight 78.4 kg (172 lb 13.5 oz), SpO2 94 %.  PHYSICAL EXAMINATION:   Physical Exam  GENERAL:  70 y.o.-year-old patient lying in the bed encephalopathic   EYES: Pupils equal, round, reactive to light. No scleral icterus.  HEENT: Head atraumatic, normocephalic. Dry oral mucosa NECK:  Supple, no jugular venous distention. No thyroid enlargement, no tenderness.  LUNGS: Normal breath sounds bilaterally, no wheezing, rales, rhonchi. No use of accessory muscles of respiration.  CARDIOVASCULAR: S1, S2 Irregular. No murmurs, rubs, or gallops.  ABDOMEN: Soft, nondistended. Hypoactive bowel sounds. Positive mid abdominal staples in place. Bilateral JP drains in place. EXTREMITIES: No cyanosis, clubbing or edema b/l.    NEUROLOGIC: Lethargic and encephalopathic. PSYCHIATRIC: Lethargic and encephalopathic.  SKIN: No obvious rash, lesion, or ulcer.    LABORATORY PANEL:   CBC  Recent Labs Lab 05/24/15 0455  WBC 5.9  HGB 12.5*  HCT 37.8*  PLT 96*   ------------------------------------------------------------------------------------------------------------------  Chemistries   Recent Labs Lab 05/23/15 0614 05/24/15 0455  NA 151* 146*  K 3.9 4.0  CL 117* 111  CO2 31 32  GLUCOSE 114* 145*  BUN 36* 29*   CREATININE 0.96 0.96  CALCIUM 7.7* 7.7*  MG 2.5* 2.5*  AST 47*  --   ALT 55  --   ALKPHOS 37*  --   BILITOT 0.9  --    ------------------------------------------------------------------------------------------------------------------  Cardiac Enzymes No results for input(s): TROPONINI in the last 168 hours. ------------------------------------------------------------------------------------------------------------------  RADIOLOGY:  Dg Chest 1 View  05/22/2015  CLINICAL DATA:  Line placement. EXAM: CHEST 1 VIEW COMPARISON:  Earlier today. FINDINGS: 1742 hours. Endotracheal tube terminates 4.7 cm above carina.Nasogastric tube terminates at the gastric body. A right-sided PICC line is new, and terminates at the low SVC versus cavoatrial junction. Midline trachea. Normal heart size for level of inspiration. No pleural effusion or pneumothorax. Low lung volumes with improved bibasilar airspace disease. IMPRESSION: Right-sided PICC line terminates at the low SVC versus superior caval/atrial junction. No pneumothorax. Improved bibasilar aeration, with mild atelectasis remaining. Electronically Signed   By: Abigail Miyamoto M.D.   On: 05/22/2015 17:59   Dg Chest Port 1 View  05/24/2015  CLINICAL DATA:  Acute respiratory failure EXAM: PORTABLE CHEST 1 VIEW COMPARISON:  05/22/2015 FINDINGS: Right-sided PICC line, endotracheal tube and nasogastric catheter are all again seen and stable in appearance. Cardiac shadow is within normal limits. Increasing bibasilar atelectasis is noted. No bony abnormality is seen. IMPRESSION: Increasing bibasilar atelectasis. Electronically Signed   By: Inez Catalina M.D.   On: 05/24/2015 07:42     ASSESSMENT AND PLAN:   70 year old male with past medical history of bladder cancer, pneumonia, alcohol abuse, hypertension who presented to the hospital due to abdominal pain and noted to have a incarcerated ventral  hernia.  #1 incarcerated ventral hernia-patient is status post  Laparotomy and removal of necrotic bowel and incarcerated hernia repair. POD # 4.  - cont. Care as per Gen. Surgery. Started on TPN now.   #2 alcohol withdrawal-extubated now.  Cont. CIWA.   #3 acute respiratory failure-patient was intubated after surgery on 2/25. -extubated today. Cont. Aggressive Pulm. Toileting.    - cont. Care as per Pulm.   #4 atrial fibrillation-remains in atrial fibrillation but rate controlled. -Echo done and shows normal EF.    -Hold on long long-term anticoagulation given the fact the patient recent surgery.  #5 fever of unknown origin-patient continues to have low-grade fever. Cultures are all negative so far.  -Continue empiric ertapenem.  Off vasopressors. -Follow fever curve  #6 history of COPD-continue duo nebs when necessary. - cont. pulmicort nebs.   #7 hypernatremia-improving with D5W.  All the records are reviewed and case discussed with Care Management/Social Workerr. Management plans discussed with the patient, family and they are in agreement.  CODE STATUS: Full Code  DVT Prophylaxis: Lovenox  TOTAL TIME TAKING CARE OF THIS PATIENT: 30 minutes.   POSSIBLE D/C unclear and DEPENDING ON CLINICAL CONDITION.   Henreitta Leber M.D on 05/24/2015 at 5:55 PM  Between 7am to 6pm - Pager - [phone]  After 6pm go to [email or web address] - password [email or web address] [facility]  Office  [phone]  CC: Primary care physician; Halina Maidens, MD

## 2015-05-24 NOTE — Progress Notes (Signed)
RT to room to NT suction patient due to audible rhonchi and patient unable to cough effectively.  RT used 76f suction catheter down right nare times 2 with moderate amount thick pink tinged, tan sputum.  Patient tolerated well, sats remained mid 90's on 2LPM [place].  Will continue to monitor.

## 2015-05-24 NOTE — Consult Note (Signed)
PULMONARY / CRITICAL CARE MEDICINE   Name: CHARITY SCHILL MRN: [id number] DOB: 03-11-1946    ADMISSION DATE:  05/17/2015 CONSULTATION DATE:  02/26  REFERRING MD:  Adonis Huguenin  PT PROFILE: 63 M with history of heavy EtOH and heavy smoking history admitted early 02/23 with incarcerated ventral hernia and SBO. Underwent laparotomy 02/25 and was noted to have a difficult airway. Prior to surgery, there was concern for alcohol withdraw. Dr Adonis Huguenin desires that he remain on vent at least through 02/26 due to concern about stability of surgical wound.  Failure to wean from vent due to delerium  MAJOR EVENTS/TEST RESULTS: VENT DAY #6 02/23 Admission 02/23 IM consult 02/25 Laparotomy, strangulated small bowel, small bowel resection  INDWELLING DEVICES:: ETT 02/25 >>   MICRO DATA: Urine 02/23 >> NEG MRSA PCR 02/25 >> NEG  ANTIMICROBIALS:  Ertapenem 02/25 >>    HISTORY OF PRESENT ILLNESS:   Remains intubated,sedated. Going through ETOH withdrawal Critically ill, prognosis is gaurded-will attempt SAT again today    VITAL SIGNS: BP 98/67 mmHg  Pulse 91  Temp(Src) 100.6 F (38.1 C) (Rectal)  Resp 12  Ht 5\' 8"  (1.727 m)  Wt 172 lb 13.5 oz (78.4 kg)  BMI 26.29 kg/m2  SpO2 99%  HEMODYNAMICS:    VENTILATOR SETTINGS: Vent Mode:  [-] PRVC FiO2 (%):  [40 %] 40 % Set Rate:  [14 bmp] 14 bmp Vt Set:  [500 mL] 500 mL PEEP:  [5 cmH20] 5 cmH20 Plateau Pressure:  [13 cmH20] 13 cmH20  INTAKE / OUTPUT: I/O last 3 completed shifts: In: [id number] [I.V.:3879.3; Other:30] Out: [id number] [Urine:3815; Emesis/NG output:250; Drains:285]  PHYSICAL EXAMINATION: General: appears older than chronological age, intubated, RASS -3 Neuro: CNs intact, MAEs HEENT: NCAT Cardiovascular: reg, no M Lungs: no wheezes, no adventitious sounds Abdomen: post op changes, abd soft, diminished to absent BS Ext: warm, no edema  LABS:  BMET  Recent Labs Lab 05/22/15 0522 05/23/15 0614 05/24/15 0455  NA 151* 151*  146*  K 3.7 3.9 4.0  CL 116* 117* 111  CO2 29 31 32  BUN 36* 36* 29*  CREATININE 1.20 0.96 0.96  GLUCOSE 118* 114* 145*    Electrolytes  Recent Labs Lab 05/22/15 0522 05/22/15 0524 05/23/15 0614 05/24/15 0455  CALCIUM 8.1*  --  7.7* 7.7*  MG  --  2.4 2.5* 2.5*  PHOS  --  3.1 3.8 3.8    CBC  Recent Labs Lab 05/22/15 0522 05/23/15 0614 05/24/15 0455  WBC 5.7 6.8 5.9  HGB 14.1 12.7* 12.5*  HCT 42.4 38.5* 37.8*  PLT 116* 104* 96*    Coag's  Recent Labs Lab 05/17/15 2151 05/18/15 0423  APTT 29 29  INR 1.01 1.13    Sepsis Markers No results for input(s): LATICACIDVEN, PROCALCITON, O2SATVEN in the last 168 hours.  ABG  Recent Labs Lab 05/19/15 0915 05/20/15 1800 05/22/15 1100  PHART 7.47* 7.31* 7.43  PCO2ART 32 52* 31*  PO2ART 53* 74* 107    Liver Enzymes  Recent Labs Lab 05/18/15 0423 05/22/15 0522 05/23/15 0614  AST 13* 64* 47*  ALT 13* 41 55  ALKPHOS 55 28* 37*  BILITOT 1.0 0.8 0.9  ALBUMIN 3.8 1.9* 1.8*    Cardiac Enzymes No results for input(s): TROPONINI, PROBNP in the last 168 hours.  Glucose  Recent Labs Lab 05/23/15 1145 05/23/15 1654 05/23/15 1942 05/23/15 2342 05/24/15 0437 05/24/15 0751  GLUCAP 160* 135* 131* 153* 130* 133*    CXR: Minimal RLL atx  ASSESSMENT / PLAN: 70 yo white male with acute s/p laparotomy for acute small bowel infarction complicated by acute resp failure and metabolic encephalopathy and acute etoh withdrawal requiring vent support  PULMONARY A: Post op vent status  Heavy smoker without prior dx of COPD, no overt airflow obstruction P:   Cont vent support - settings reviewed and/or adjusted Work in PSV mode as tolerated Cont vent bundle Daily SBT if/when meets criteria Cont nebulized steroids and bronchodilators -remains encephalopathic-will wean sedation and assess neuro status  CARDIOVASCULAR A:  Transient post op hypotension - resolved P:  MAP goal > 65 mmHg  RENAL A:    -hypernatremia Borderline hypokalemia P:   Monitor BMET intermittently Monitor I/Os Correct electrolytes as indicated changed to d5w  NA=151.Marland Kitchen..151....146   GASTROINTESTINAL A:   Post laparotomy P:   SUP: IV PPI Started TPN, picc line placed  HEMATOLOGIC A:   Very mild thrombocytopenia P:  DVT px: LMWH Monitor CBC intermittently Transfuse per usual ICU guidelines  INFECTIOUS A:   Post laparotomy, small bowel infarction P:   Monitor temp, WBC count Micro and abx as above  ENDOCRINE A:   Mild hyperglycemia without prior dx of DM P:   Monitor glu on chem panels Consider SSI for glu > 180  NEUROLOGIC A:   Heavy EtOH use Post op pain P:   RASS goal: -2, -3 Cont Precedex infusion Initiate fentanyl infusion 02/26 Continue scheduled IV valium  FAMILY  No family @ bedside  The Patient requires high complexity decision making for assessment and support, frequent evaluation and titration of therapies, application of advanced monitoring technologies and extensive interpretation of multiple databases. Critical Care Time devoted to patient care services described in this note is 40 minutes.   Overall, patient is critically ill, prognosis is guarded.  Patient with Multiorgan failure and at high risk for cardiac arrest and death.    Corrin Parker, M.D.  [facility]  Medical Director [place] Director [place] Department        05/24/2015, 9:50 AM

## 2015-05-24 NOTE — Progress Notes (Signed)
RT to room to extubate patient per Dr. Mortimer Fries order.  Patient suctioned prior to extubation, extubated, and placed on 2LPM [place] without complication.  Will continue to monitor.  O2 sat 95% on 2LPM [place].

## 2015-05-24 NOTE — Care Management (Signed)
Extubated today. Remains on Precedex infusion, TPN and IV antibiotics

## 2015-05-24 NOTE — Progress Notes (Signed)
RT again to room to NT suction patient due to audible rhonchi and patient unable to clear his own secretions.  RT used 65f catheter down right nare times 2 with return of large amount bright red, tan thick secretions.  Patient tolerated well, O2 sats 93% on 3LPM [place].  Will continue to monitor.

## 2015-05-25 ENCOUNTER — Inpatient Hospital Stay: Payer: Medicare PPO

## 2015-05-25 DIAGNOSIS — R339 Retention of urine, unspecified: Secondary | ICD-10-CM

## 2015-05-25 LAB — BLOOD GAS, ARTERIAL
ALLENS TEST (PASS/FAIL): POSITIVE — AB
Acid-Base Excess: 6.6 mmol/L — ABNORMAL HIGH (ref 0.0–3.0)
Bicarbonate: 31.3 mEq/L — ABNORMAL HIGH (ref 21.0–28.0)
FIO2: 0.4
LHR: 15 {breaths}/min
O2 SAT: 96.8 %
PCO2 ART: 44 mmHg (ref 32.0–48.0)
PEEP: 5 cmH2O
Patient temperature: 37
VT: 500 mL
pH, Arterial: 7.46 — ABNORMAL HIGH (ref 7.350–7.450)
pO2, Arterial: 84 mmHg (ref 83.0–108.0)

## 2015-05-25 LAB — BASIC METABOLIC PANEL
Anion gap: 4 — ABNORMAL LOW (ref 5–15)
BUN: 22 mg/dL — AB (ref 6–20)
CHLORIDE: 106 mmol/L (ref 101–111)
CO2: 32 mmol/L (ref 22–32)
Calcium: 7.3 mg/dL — ABNORMAL LOW (ref 8.9–10.3)
Creatinine, Ser: 0.75 mg/dL (ref 0.61–1.24)
GFR calc Af Amer: >60 mL/min (ref >60)
GFR calc non Af Amer: >60 mL/min (ref >60)
GLUCOSE: 167 mg/dL — AB (ref 65–99)
POTASSIUM: 3.4 mmol/L — AB (ref 3.5–5.1)
SODIUM: 142 mmol/L (ref 135–145)

## 2015-05-25 LAB — MAGNESIUM: MAGNESIUM: 2.2 mg/dL (ref 1.7–2.4)

## 2015-05-25 LAB — CBC
HCT: 35.5 % — ABNORMAL LOW (ref 40.0–52.0)
Hemoglobin: 12 g/dL — ABNORMAL LOW (ref 13.0–18.0)
MCH: 32 pg (ref 26.0–34.0)
MCHC: 33.9 g/dL (ref 32.0–36.0)
MCV: 94.4 fL (ref 80.0–100.0)
Platelets: 110 10*3/uL — ABNORMAL LOW (ref 150–440)
RBC: 3.76 MIL/uL — ABNORMAL LOW (ref 4.40–5.90)
RDW: 13.4 % (ref 11.5–14.5)
WBC: 6.4 10*3/uL (ref 3.8–10.6)

## 2015-05-25 LAB — GLUCOSE, CAPILLARY
GLUCOSE-CAPILLARY: 189 mg/dL — AB (ref 65–99)
GLUCOSE-CAPILLARY: 106 mg/dL — AB (ref 65–99)
Glucose-Capillary: 140 mg/dL — ABNORMAL HIGH (ref 65–99)
Glucose-Capillary: 159 mg/dL — ABNORMAL HIGH (ref 65–99)
Glucose-Capillary: 165 mg/dL — ABNORMAL HIGH (ref 65–99)
Glucose-Capillary: 90 mg/dL (ref 65–99)

## 2015-05-25 LAB — CULTURE, RESPIRATORY W GRAM STAIN

## 2015-05-25 LAB — PHOSPHORUS: Phosphorus: 2.4 mg/dL — ABNORMAL LOW (ref 2.5–4.6)

## 2015-05-25 MED ORDER — POTASSIUM PHOSPHATES 15 MMOLE/5ML IV SOLN
10.0000 mmol | Freq: Once | INTRAVENOUS | Status: AC
  Administered 2015-05-25: 10 mmol via INTRAVENOUS
  Filled 2015-05-25 (×2): qty 3.33

## 2015-05-25 MED ORDER — MIDAZOLAM HCL 2 MG/2ML IJ SOLN
4.0000 mg | Freq: Once | INTRAMUSCULAR | Status: AC
  Administered 2015-05-25: 4 mg via INTRAVENOUS

## 2015-05-25 MED ORDER — VECURONIUM BROMIDE 10 MG IV SOLR
INTRAVENOUS | Status: AC
  Administered 2015-05-25: 10 mg via INTRAVENOUS
  Filled 2015-05-25: qty 10

## 2015-05-25 MED ORDER — VECURONIUM BROMIDE 10 MG IV SOLR
10.0000 mg | Freq: Once | INTRAVENOUS | Status: AC
  Administered 2015-05-25: 10 mg via INTRAVENOUS

## 2015-05-25 MED ORDER — FENTANYL CITRATE (PF) 100 MCG/2ML IJ SOLN
200.0000 ug | Freq: Once | INTRAMUSCULAR | Status: AC
  Administered 2015-05-25: 200 ug via INTRAVENOUS

## 2015-05-25 MED ORDER — SODIUM CHLORIDE 0.9 % IV BOLUS (SEPSIS)
1000.0000 mL | Freq: Once | INTRAVENOUS | Status: AC
  Administered 2015-05-25: 1000 mL via INTRAVENOUS

## 2015-05-25 MED ORDER — FENTANYL CITRATE (PF) 100 MCG/2ML IJ SOLN
INTRAMUSCULAR | Status: AC
  Administered 2015-05-25: 200 ug via INTRAVENOUS
  Filled 2015-05-25: qty 4

## 2015-05-25 MED ORDER — MIDAZOLAM HCL 2 MG/2ML IJ SOLN
INTRAMUSCULAR | Status: AC
  Administered 2015-05-25: 4 mg via INTRAVENOUS
  Filled 2015-05-25: qty 4

## 2015-05-25 MED ORDER — M.V.I. ADULT IV INJ
INJECTION | INTRAVENOUS | Status: AC
  Administered 2015-05-25: 22:00:00 via INTRAVENOUS
  Filled 2015-05-25: qty 1992

## 2015-05-25 MED ORDER — STERILE WATER FOR INJECTION IJ SOLN
INTRAMUSCULAR | Status: AC
  Administered 2015-05-25: 08:00:00
  Filled 2015-05-25: qty 10

## 2015-05-25 MED ORDER — POTASSIUM CHLORIDE 10 MEQ/50ML IV SOLN
10.0000 meq | INTRAVENOUS | Status: AC
  Administered 2015-05-25 (×2): 10 meq via INTRAVENOUS
  Filled 2015-05-25 (×2): qty 50

## 2015-05-25 NOTE — Plan of Care (Signed)
Problem: Nutrition: Goal: Adequate nutrition will be maintained Outcome: Progressing Pt is on TPN for nutrition

## 2015-05-25 NOTE — Progress Notes (Signed)
POD # 5   Severity yesterday but now this morning struggling and reintubated Fevers are subsiding is still has some intermittent agitation. Actual aspiration secondary to worsening of the respiratory status.  Physical exam He is tachypneic and gasping for air. Abd: Soft, incision clean dry and intact JPs with serosanguineous drainage. No peritonitis. DECREASE bs EXT; well perfused  A/p Any issues respiratory failure not patient being reintubated. Continue current antibiotic therapy. DT remains an issue. From surgical perspective he continues to have an ileus and will keep NG tube for now. Continue TPN

## 2015-05-25 NOTE — Progress Notes (Signed)
Patient had to be reintubated this morning. Sister notified via phone about change in condition.

## 2015-05-25 NOTE — Care Management (Signed)
had to be re intubated

## 2015-05-25 NOTE — Procedures (Signed)
Endotracheal Intubation: Patient required placement of an artificial airway secondary to resp failure.   Consent: Emergent.   Hand washing performed prior to starting the procedure.   Medications administered for sedation prior to procedure: Midazolam 4 mg IV,  Vecuronium 10 mg IV, Fentanyl 100 mcg IV.   Procedure: A time out procedure was called and correct patient, name, & ID confirmed. Needed supplies and equipment were assembled and checked to include ETT, 10 ml syringe, Glidescope, Mac and Miller blades, suction, oxygen and bag mask valve, end tidal CO2 monitor. Patient was positioned to align the mouth and pharynx to facilitate visualization of the glottis.  Heart rate, SpO2 and blood pressure was continuously monitored during the procedure. Pre-oxygenation was conducted prior to intubation and endotracheal tube was placed through the vocal cords into the trachea.  During intubation an assistant applied gentle pressure to the cricoid cartilage.   The artificial airway was placed under direct visualization via glidescope route using a 8.0 ETT on the first attempt.    ETT was secured at 23 cm mark.    Placement was confirmed by auscuitation of lungs with good breath sounds bilaterally and no stomach sounds.  Condensation was noted on endotracheal tube.  Pulse ox 96%.  CO2 detector in place with appropriate color change.   Complications: None .   Operator: Matea Stanard.   Chest radiograph ordered and pending.    Corrin Parker, M.D.  [facility]  Medical Director [place] Director [facility] Cardio-P[facility] Department

## 2015-05-25 NOTE — Progress Notes (Signed)
Family meeting held with Dr. Delana Meyer and patient's son and daughter. Intubation explained to them in detail. I reiterated that he was not a surgical candidate. At this point they want to continue medical management with antibiotics NG tube and nothing by mouth. They do know that the prognosis is guarded but they are not ready to give up. Approximately Brady Smith exam has not changed and his abdomen and overall status is the same as yesterday. He does continue to spike some temperatures but his abdomen is soft and they might be some tenderness. We will continue to follow and respect family wishes.

## 2015-05-25 NOTE — Progress Notes (Signed)
Initial Nutrition Assessment    INTERVENTION:   PN: discussed TPN plan with MD Pabon and MD Kasa; plan to increase to goal of 83 ml/hr (1992 mL of fluid, 100 g of protein, 1755 kcals); discussed current D5 infusion with MD Kasa, MD gave orders to discontinue as sodium wdl and TPN rate increased. Potassium and phosphrous being supplemented   NUTRITION DIAGNOSIS:   Inadequate oral intake related to acute illness, altered GI function as evidenced by NPO status. Being addressed via TPN   GOAL:   Provide needs based on ASPEN/SCCM guidelines   MONITOR:    (PN, Energy Intake, Anthropometrics, Electrolyte/Renal Profile, Glucose Profile)  REASON FOR ASSESSMENT:   Ventilator, Consult New TPN/TNA  ASSESSMENT:    Pt s/p extubation yesterday but required reintubation this AM, pt with increased WOB with abdominal distention and increased NG output  Diet Order:  .TPN (CLINIMIX-E) Adult .TPN (CLINIMIX-E) Adult  PN: 5%AA/20%Dextrose infusing at rate of 40 ml/hr via PICC line  Digestive System: abdomen distended, no BM, NG-LIS  Urine Volume: UOP 1925 mL in past 24 hours   Recent Labs Lab 05/23/15 0614 05/24/15 0455 05/25/15 0411  NA 151* 146* 142  K 3.9 4.0 3.4*  CL 117* 111 106  CO2 31 32 32  BUN 36* 29* 22*  CREATININE 0.96 0.96 0.75  CALCIUM 7.7* 7.7* 7.3*  MG 2.5* 2.5* 2.2  PHOS 3.8 3.8 2.4*  GLUCOSE 114* 145* 167*    Glucose Profile:   Recent Labs  05/25/15 0410 05/25/15 0740 05/25/15 1149  GLUCAP 159* 140* 189*   Meds: ss novolog  Height:   Ht Readings from Last 1 Encounters:  05/18/15 5\' 8"  (1.727 m)    Weight:   Wt Readings from Last 1 Encounters:  05/25/15 174 lb 9.7 oz (79.2 kg)    Filed Weights   05/24/15 0523 05/24/15 0535 05/25/15 0500  Weight: 171 lb 11.8 oz (77.9 kg) 172 lb 13.5 oz (78.4 kg) 174 lb 9.7 oz (79.2 kg)    BMI:  Body mass index is 26.55 kg/(m^2).  Estimated Nutritional Needs:   Kcal:  1852 kcals (Ve: 6.5,. Tmax: 38.3)  using wt of 79.2 kg  Protein:  116-154 g(1.5-2.0 g/kg)   Fluid:  1925-2310 mL (25-30 ml/kg)   HIGH Care Level  Kerman Passey MS, RD, LDN [phone] Pager  [phone] Weekend/On-Call Pager

## 2015-05-25 NOTE — Progress Notes (Signed)
Patient reintubated by Dr. Mortimer Fries using 3 lopro blade on glidoscope with 8.0 ETT to 24 @ lip.  ETT placement verified and secured, then placed patient on servo vent without complication.  Will continue to monitor.

## 2015-05-25 NOTE — Consult Note (Signed)
PULMONARY / CRITICAL CARE MEDICINE   Name: Brady Smith MRN: [id number] DOB: June 22, 1945    ADMISSION DATE:  05/17/2015 CONSULTATION DATE:  02/26  REFERRING MD:  Adonis Huguenin  PT PROFILE: 16 M with history of heavy EtOH and heavy smoking history admitted early 02/23 with incarcerated ventral hernia and SBO. Underwent laparotomy 02/25 and was noted to have a difficult airway. Prior to surgery, there was concern for alcohol withdraw. Dr Adonis Huguenin desires that he remain on vent at least through 02/26 due to concern about stability of surgical wound.  Failure to wean from vent due to delerium  MAJOR EVENTS/TEST RESULTS: VENT DAY #6 02/23 Admission 02/23 IM consult 02/25 Laparotomy, strangulated small bowel, small bowel resection 03/01 EXTUBATED 03/02 RE-INTUBATED  INDWELLING DEVICES:: ETT 02/25 >>   MICRO DATA: Urine 02/23 >> NEG MRSA PCR 02/25 >> NEG  ANTIMICROBIALS:  Ertapenem 02/25 >>    HISTORY OF PRESENT ILLNESS:   Patient was successfully extubated last night,however this AM had increased WOB, using accessory muscles to breathe unable to protect airway,  abd distended, NG had increased output Patient emergently intubated, placed on MV support    VITAL SIGNS: BP 94/60 mmHg  Pulse 49  Temp(Src) 100 F (37.8 C) (Rectal)  Resp 31  Ht 5\' 8"  (1.727 m)  Wt 174 lb 9.7 oz (79.2 kg)  BMI 26.55 kg/m2  SpO2 96%  HEMODYNAMICS:    VENTILATOR SETTINGS: Vent Mode:  [-]  FiO2 (%):  [28 %] 28 %  INTAKE / OUTPUT: I/O last 3 completed shifts: In: 5305.4 [I.V.:2554.7; Other:10; IV Piggyback:50] Out: 3205 [Urine:2950; Emesis/NG output:50; Drains:205]  PHYSICAL EXAMINATION: General: appears older than chronological age, intubated,  Neuro: gcs<8T HEENT: NCAT Cardiovascular: reg, no M Lungs: no wheezes, no adventitious sounds Abdomen: post op changes, abd soft, diminished to absent BS Ext: warm, no edema  LABS:  BMET  Recent Labs Lab 05/23/15 0614 05/24/15 0455  05/25/15 0411  NA 151* 146* 142  K 3.9 4.0 3.4*  CL 117* 111 106  CO2 31 32 32  BUN 36* 29* 22*  CREATININE 0.96 0.96 0.75  GLUCOSE 114* 145* 167*    Electrolytes  Recent Labs Lab 05/23/15 0614 05/24/15 0455 05/25/15 0411  CALCIUM 7.7* 7.7* 7.3*  MG 2.5* 2.5* 2.2  PHOS 3.8 3.8 2.4*    CBC  Recent Labs Lab 05/23/15 0614 05/24/15 0455 05/25/15 0411  WBC 6.8 5.9 6.4  HGB 12.7* 12.5* 12.0*  HCT 38.5* 37.8* 35.5*  PLT 104* 96* 110*    Coag's No results for input(s): APTT, INR in the last 168 hours.  Sepsis Markers No results for input(s): LATICACIDVEN, PROCALCITON, O2SATVEN in the last 168 hours.  ABG  Recent Labs Lab 05/20/15 1800 05/22/15 1100 05/24/15 1144  PHART 7.31* 7.43 7.41  PCO2ART 52* 31* 48  PO2ART 74* 107 69*    Liver Enzymes  Recent Labs Lab 05/22/15 0522 05/23/15 0614  AST 64* 47*  ALT 41 55  ALKPHOS 28* 37*  BILITOT 0.8 0.9  ALBUMIN 1.9* 1.8*    Cardiac Enzymes No results for input(s): TROPONINI, PROBNP in the last 168 hours.  Glucose  Recent Labs Lab 05/24/15 1122 05/24/15 1639 05/24/15 1947 05/25/15 0007 05/25/15 0410 05/25/15 0740  GLUCAP 125* 134* 148* 165* 159* 140*    CXR: Minimal RLL atx   ASSESSMENT / PLAN: 70 yo white male with acute s/p laparotomy for acute small bowel infarction complicated by acute resp failure and metabolic encephalopathy and acute etoh withdrawal requiring vent  support  PULMONARY A: Post op vent status  Heavy smoker without prior dx of COPD, no overt airflow obstruction P:   Cont vent support - settings reviewed and/or adjusted Cont vent bundle Cont nebulized steroids and bronchodilators -will plan for weaning trails in next 48-72 hrs -patient will likley need trach  CARDIOVASCULAR A:  Transient post op hypotension - resolved P:  MAP goal > 65 mmHg  RENAL A:   -hypernatremia Borderline hypokalemia P:   Monitor BMET intermittently Monitor I/Os Correct electrolytes  as indicated changed to d5w  NA=151.Marland Kitchen..151....146...142   GASTROINTESTINAL A:   Post laparotomy P:   SUP: IV PPI Started TPN, picc line placed -NG to suction  HEMATOLOGIC A:   Very mild thrombocytopenia P:  DVT px: LMWH Monitor CBC intermittently Transfuse per usual ICU guidelines  INFECTIOUS A:   Post laparotomy, small bowel infarction P:   Monitor temp, WBC count Micro and abx as above  ENDOCRINE A:   Mild hyperglycemia without prior dx of DM P:   Monitor glu on chem panels Consider SSI for glu > 180  NEUROLOGIC A:   Heavy ETOH use Post op pain P:   RASS goal: -2, -3 Cont Precedex infusion Initiate fentanyl infusion 02/26 Continue scheduled IV valium  FAMILY  No family @ bedside  The Patient requires high complexity decision making for assessment and support, frequent evaluation and titration of therapies, application of advanced monitoring technologies and extensive interpretation of multiple databases. Critical Care Time devoted to patient care services described in this note is 45 minutes.   Overall, patient is critically ill, prognosis is guarded.  Patient with Multiorgan failure and at high risk for cardiac arrest and death.  Plan for another trial of extubation in 1-2 days  Trinika Cortese Patricia Pesa, M.D.  [facility]  Medical Director [place] Director [place] Department        05/25/2015, 8:42 AM

## 2015-05-25 NOTE — Progress Notes (Signed)
Pt picked up from Judson Roch, RN this afternoon. Pt remains intubated on 40% and follows commands. Fine crackles and diminished lower lobes to lung auscultation. Pink-tinged secretions from subglottic. OG to suction with 522ml of green-brown output. Bowel sounds are faint and his surgical incision is open to air with no drainage. Right and left JP drains are charged and are both reddened at insertion site with slight mucus discharge. BP has been soft with Precedex and fentanyl; which has been carefully titrated. TPN is infusing with rate change due at 1900. Low grade fever has been noted with a tmax of 100.4. Urine out put has been very minimal with 60 ml total output this shift. Warren Lacy was called with the nurse to report to elink MD Dr. Lamonte Sakai. Report given to next shift RN, Lorriane Shire.

## 2015-05-25 NOTE — Progress Notes (Signed)
[place] [place] NAME: Brady Smith    MR#:  [id number]  DATE OF BIRTH:  12-16-1945  SUBJECTIVE:   Patient is here due to an incarcerated ventral hernia. Status post strangulated ventral hernia repair POD # 5 Patient was reintubated earlier today. Remains sedated & intubated now.    REVIEW OF SYSTEMS:    Review of Systems  Unable to perform ROS: dementia    Nutrition: NPO on TPN Tolerating Diet: No Tolerating PT: Await Eval.     DRUG ALLERGIES:  No Known Allergies  VITALS:  Blood pressure 77/53, pulse 75, temperature 99.3 F (37.4 C), temperature source Rectal, resp. rate 22, height 5\' 8"  (1.727 m), weight 79.2 kg (174 lb 9.7 oz), SpO2 97 %.  PHYSICAL EXAMINATION:   Physical Exam  GENERAL:  70 y.o.-year-old patient lying in the bed sedated & intubated.    EYES: Pupils equal, round, reactive to light. No scleral icterus.  HEENT: Head atraumatic, normocephalic. ET and NG tube in place.   NECK:  Supple, no jugular venous distention. No thyroid enlargement, no tenderness.  LUNGS: Normal breath sounds bilaterally, no wheezing, rales, rhonchi. No use of accessory muscles of respiration.  CARDIOVASCULAR: S1, S2 Irregular. No murmurs, rubs, or gallops.  ABDOMEN: Soft, nondistended. Hypoactive bowel sounds. Positive mid abdominal staples in place. Bilateral JP drains in place. EXTREMITIES: No cyanosis, clubbing or edema b/l.    NEUROLOGIC: Lethargic and encephalopathic & sedated PSYCHIATRIC: Lethargic and encephalopathic & sedated.  SKIN: No obvious rash, lesion, or ulcer.    LABORATORY PANEL:   CBC  Recent Labs Lab 05/25/15 0411  WBC 6.4  HGB 12.0*  HCT 35.5*  PLT 110*   ------------------------------------------------------------------------------------------------------------------  Chemistries   Recent Labs Lab 05/23/15 0614  05/25/15 0411  NA 151*  < > 142  K 3.9  < > 3.4*  CL 117*  < > 106  CO2 31   < > 32  GLUCOSE 114*  < > 167*  BUN 36*  < > 22*  CREATININE 0.96  < > 0.75  CALCIUM 7.7*  < > 7.3*  MG 2.5*  < > 2.2  AST 47*  --   --   ALT 55  --   --   ALKPHOS 37*  --   --   BILITOT 0.9  --   --   < > = values in this interval not displayed. ------------------------------------------------------------------------------------------------------------------  Cardiac Enzymes No results for input(s): TROPONINI in the last 168 hours. ------------------------------------------------------------------------------------------------------------------  RADIOLOGY:  Portable Chest Xray  05/25/2015  CLINICAL DATA:  Hypoxia EXAM: PORTABLE CHEST 1 VIEW COMPARISON:  May 24, 2015 FINDINGS: Endotracheal tube tip is 2.5 cm above the carina. Central catheter tip is in the superior vena cava. Nasogastric tube tip and side port are in the stomach. No pneumothorax. There are small pleural effusions bilaterally with patchy bibasilar atelectasis. There is slight interstitial edema in the bases. Heart is borderline prominent with pulmonary vascularity within normal limits. No adenopathy evident. IMPRESSION: Tube and catheter positions as described without pneumothorax. Suspect a degree of congestive heart failure. There is patchy bibasilar atelectasis as well. No airspace consolidation evident. Electronically Signed   By: Lowella Grip III M.D.   On: 05/25/2015 09:14   Dg Chest Port 1 View  05/24/2015  CLINICAL DATA:  Acute respiratory failure EXAM: PORTABLE CHEST 1 VIEW COMPARISON:  05/22/2015 FINDINGS: Right-sided PICC line, endotracheal tube and nasogastric catheter are all again seen and stable  in appearance. Cardiac shadow is within normal limits. Increasing bibasilar atelectasis is noted. No bony abnormality is seen. IMPRESSION: Increasing bibasilar atelectasis. Electronically Signed   By: Inez Catalina M.D.   On: 05/24/2015 07:42     ASSESSMENT AND PLAN:   70 year old male with past medical history of  bladder cancer, pneumonia, alcohol abuse, hypertension who presented to the hospital due to abdominal pain and noted to have a incarcerated ventral hernia.  #1 incarcerated ventral hernia-patient is status post Laparotomy and removal of necrotic bowel and incarcerated hernia repair. POD # 5.  -Noted to have a postoperative ileus today and had NG tube placed. -Continue TPN, and further care as per general surgery.  #2 alcohol withdrawal-continue Precedex drip for now. -cont. CIWA protocol.   #3 acute respiratory failure-patient reintubated yesterday due to encephalopathy and not noted to have ileus and possible aspiration pneumonia.  - sputum Cx + for Citrobacter.  Cont. Ertapenem.  - cont. Vent management as per Pulm.  Currently on 40% FiO2.   #4 atrial fibrillation-remains in atrial fibrillation but rate controlled. -Echo done and shows normal EF.    -Hold on long long-term anticoagulation given the fact the patient recent surgery.  #5 fever of unknown origin-fever now resolved. Suspected to be due to aspiration pneumonia. Continue ertapenem. -Follow fever curve.  #6 history of COPD-continue duo nebs when necessary. - cont. pulmicort nebs.   #7 hypernatremia-normalized now with D5W.   All the records are reviewed and case discussed with Care Management/Social Workerr. Management plans discussed with the patient, family and they are in agreement.  CODE STATUS: Full Code  DVT Prophylaxis: Lovenox  TOTAL Critical Care TIME TAKING CARE OF THIS PATIENT: 30 minutes.   POSSIBLE D/C unclear and DEPENDING ON CLINICAL CONDITION.   Henreitta Leber M.D on 05/25/2015 at 4:49 PM  Between 7am to 6pm - Pager - [phone]  After 6pm go to [email or web address] - password [email or web address] [place] [facility]  Office  [phone]  CC: Primary care physician; Halina Maidens, MD

## 2015-05-25 NOTE — Progress Notes (Signed)
PARENTERAL NUTRITION CONSULT NOTE   Pharmacy Consult for electrolytes and insulin management Indication: ICU patient requiring mechanical ventilation and TPN  No Known Allergies  Patient Measurements: Height: 5\' 8"  (172.7 cm) Weight: 174 lb 9.7 oz (79.2 kg) IBW/kg (Calculated) : 68.4   Vital Signs: Temp: 99.3 F (37.4 C) (03/02 1130) BP: 77/53 mmHg (03/02 1130) Pulse Rate: 75 (03/02 1130) Intake/Output from previous day: 03/01 0701 - 03/02 0700 In: 3535.4 [I.V.:1234.7; IV Piggyback:50; TPN:2250.7] Out: 2080 [Urine:1925; Drains:155] Intake/Output from this shift:    Labs:  Recent Labs  05/23/15 0614 05/24/15 0455 05/25/15 0411  WBC 6.8 5.9 6.4  HGB 12.7* 12.5* 12.0*  HCT 38.5* 37.8* 35.5*  PLT 104* 96* 110*     Recent Labs  05/23/15 0614 05/24/15 0455 05/25/15 0411  NA 151* 146* 142  K 3.9 4.0 3.4*  CL 117* 111 106  CO2 31 32 32  GLUCOSE 114* 145* 167*  BUN 36* 29* 22*  CREATININE 0.96 0.96 0.75  CALCIUM 7.7* 7.7* 7.3*  MG 2.5* 2.5* 2.2  PHOS 3.8 3.8 2.4*  PROT 4.9*  --   --   ALBUMIN 1.8*  --   --   AST 47*  --   --   ALT 55  --   --   ALKPHOS 37*  --   --   BILITOT 0.9  --   --   TRIG 81  --   --    Estimated Creatinine Clearance: 84.3 mL/min (by C-G formula based on Cr of 0.75).    Recent Labs  05/25/15 0007 05/25/15 0410 05/25/15 0740  GLUCAP 165* 159* 140*    Medical History: Past Medical History  Diagnosis Date  . History of kidney cancer   . Bladder cancer ([place])   . Pneumonia     Medications:  Scheduled:  . antiseptic oral rinse  7 mL Mouth Rinse QID  . budesonide (PULMICORT) nebulizer solution  0.5 mg Nebulization BID  . chlorhexidine gluconate  15 mL Mouth Rinse BID  . diazepam  5 mg Intravenous Q6H  . enoxaparin (LOVENOX) injection  40 mg Subcutaneous Q24H  . ertapenem (INVANZ) IV  250 mg Intravenous Once  . ertapenem (INVANZ) IV  1 g Intravenous Once  . ertapenem  1 g Intravenous Q24H  . insulin aspart  2-6 Units  Subcutaneous 6 times per day  . ipratropium-albuterol  3 mL Nebulization Q6H  . pantoprazole (PROTONIX) IV  40 mg Intravenous Q24H  . potassium phosphate IVPB (mmol)  10 mmol Intravenous Once  . sodium chloride  1,000 mL Intravenous Once  . thiamine  100 mg Intravenous Daily   Infusions:  . Marland KitchenTPN (CLINIMIX-E) Adult 40 mL/hr at 05/24/15 1800  . Marland KitchenTPN (CLINIMIX-E) Adult    . dexmedetomidine 0.4 mcg/kg/hr (05/25/15 0841)  . dextrose 100 mL/hr at 05/25/15 0147  . fentaNYL infusion INTRAVENOUS 50 mcg/hr (05/25/15 0841)    Insulin Requirements in the past 24 hours:  14  Current Nutrition:  TPN 5/20 at 11mL/hr   Plan:  Will continue patient on SSI Q4hr while patient is on TPN. Potassium and phos are slightly low so will replace with potassium phosphate 10 mmol iv once and  f/u am labs.   Pharmacy will continue to monitor and adjust per consult.    Ulice Dash D 05/25/2015,11:58 AM

## 2015-05-25 NOTE — Progress Notes (Signed)
[facility] ICU Electrolyte Replacement Protocol  Patient Name: Brady Smith DOB: [birth year] MRN: [id number]  Date of Service  05/25/2015   HPI/Events of Note    Recent Labs Lab 05/21/15 0737 05/22/15 0522 05/22/15 0524 05/23/15 0614 05/24/15 0455 05/25/15 0411  NA 145 151*  --  151* 146* 142  K 3.6 3.7  --  3.9 4.0 3.4*  CL 115* 116*  --  117* 111 106  CO2 23 29  --  31 32 32  GLUCOSE 142* 118*  --  114* 145* 167*  BUN 30* 36*  --  36* 29* 22*  CREATININE 1.12 1.20  --  0.96 0.96 0.75  CALCIUM 7.9* 8.1*  --  7.7* 7.7* 7.3*  MG 2.2  --  2.4 2.5* 2.5* 2.2  PHOS 3.2  --  3.1 3.8 3.8 2.4*    Estimated Creatinine Clearance: 84.3 mL/min (by C-G formula based on Cr of 0.75).  Intake/Output      03/01 0701 - 03/02 0700   I.V. (mL/kg) 1217.1 (15.4)   IV Piggyback 50   TPN 930.7   Total Intake(mL/kg) 2197.8 (27.8)   Urine (mL/kg/hr) 1925 (1)   Drains 155 (0.1)   Total Output 2080   Net +117.8        - I/O DETAILED x24h    Total I/O In: 163.1 [I.V.:163.1] Out: 580 [Urine:550; Drains:30] - I/O THIS SHIFT    ASSESSMENT   eICURN Interventions  K+ replaced per protocol. MD notified   ASSESSMENT: MAJOR ELECTROLYTE    Lorene Dy 05/25/2015, 5:52 AM

## 2015-05-26 LAB — BASIC METABOLIC PANEL
Anion gap: 5 (ref 5–15)
BUN: 23 mg/dL — AB (ref 6–20)
CHLORIDE: 106 mmol/L (ref 101–111)
CO2: 30 mmol/L (ref 22–32)
Calcium: 7.3 mg/dL — ABNORMAL LOW (ref 8.9–10.3)
Creatinine, Ser: 0.88 mg/dL (ref 0.61–1.24)
GFR calc Af Amer: >60 mL/min (ref >60)
GFR calc non Af Amer: >60 mL/min (ref >60)
GLUCOSE: 145 mg/dL — AB (ref 65–99)
POTASSIUM: 4 mmol/L (ref 3.5–5.1)
Sodium: 141 mmol/L (ref 135–145)

## 2015-05-26 LAB — PHOSPHORUS: Phosphorus: 2.9 mg/dL (ref 2.5–4.6)

## 2015-05-26 LAB — MAGNESIUM: Magnesium: 2.2 mg/dL (ref 1.7–2.4)

## 2015-05-26 LAB — GLUCOSE, CAPILLARY
GLUCOSE-CAPILLARY: 157 mg/dL — AB (ref 65–99)
GLUCOSE-CAPILLARY: 130 mg/dL — AB (ref 65–99)
GLUCOSE-CAPILLARY: 167 mg/dL — AB (ref 65–99)
GLUCOSE-CAPILLARY: 120 mg/dL — AB (ref 65–99)
Glucose-Capillary: 156 mg/dL — ABNORMAL HIGH (ref 65–99)
Glucose-Capillary: 79 mg/dL (ref 65–99)

## 2015-05-26 MED ORDER — SODIUM CHLORIDE 0.9 % IV SOLN
INTRAVENOUS | Status: DC
  Administered 2015-05-26 – 2015-05-28 (×3): via INTRAVENOUS
  Filled 2015-05-26 (×9): qty 200

## 2015-05-26 MED ORDER — TRACE MINERALS CR-CU-MN-SE-ZN 10-1000-500-60 MCG/ML IV SOLN
INTRAVENOUS | Status: AC
  Administered 2015-05-26: 20:00:00 via INTRAVENOUS
  Filled 2015-05-26: qty 1992

## 2015-05-26 MED ORDER — SODIUM CHLORIDE 0.9 % IV SOLN: INTRAVENOUS | Status: DC

## 2015-05-26 NOTE — Progress Notes (Signed)
[facility] NOTE   Pharmacy Consult for electrolytes and insulin management Indication: ICU patient requiring mechanical ventilation and TPN  No Known Allergies  Patient Measurements: Height: 5\' 8"  (172.7 cm) Weight: 180 lb 5.4 oz (81.8 kg) IBW/kg (Calculated) : 68.4   Vital Signs: Temp: 100.4 F (38 C) (03/03 0600) Temp Source: Rectal (03/03 0400) BP: 84/54 mmHg (03/03 0600) Pulse Rate: 73 (03/03 0600) Intake/Output from previous day: 03/02 0701 - 03/03 0700 In: 2403.8 [I.V.:367.9; IV Piggyback:300; TPN:1735.8] Out: 2890 [Urine:2360; Emesis/NG output:500; Drains:30] Intake/Output from this shift:    Labs:  Recent Labs  05/24/15 0455 05/25/15 0411  WBC 5.9 6.4  HGB 12.5* 12.0*  HCT 37.8* 35.5*  PLT 96* 110*     Recent Labs  05/24/15 0455 05/25/15 0411 05/26/15 0455  NA 146* 142 141  K 4.0 3.4* 4.0  CL 111 106 106  CO2 32 32 30  GLUCOSE 145* 167* 145*  BUN 29* 22* 23*  CREATININE 0.96 0.75 0.88  CALCIUM 7.7* 7.3* 7.3*  MG 2.5* 2.2 2.2  PHOS 3.8 2.4* 2.9   Estimated Creatinine Clearance: 76.6 mL/min (by C-G formula based on Cr of 0.88).    Recent Labs  05/26/15 0027 05/26/15 0423 05/26/15 0726  GLUCAP 79 157* 130*    Medical History: Past Medical History  Diagnosis Date  . History of kidney cancer   . Bladder cancer ([place])   . Pneumonia     Medications:  Scheduled:  . antiseptic oral rinse  7 mL Mouth Rinse QID  . budesonide (PULMICORT) nebulizer solution  0.5 mg Nebulization BID  . chlorhexidine gluconate  15 mL Mouth Rinse BID  . diazepam  5 mg Intravenous Q6H  . enoxaparin (LOVENOX) injection  40 mg Subcutaneous Q24H  . ertapenem (INVANZ) IV  250 mg Intravenous Once  . ertapenem (INVANZ) IV  1 g Intravenous Once  . ertapenem  1 g Intravenous Q24H  . insulin aspart  2-6 Units Subcutaneous 6 times per day  . ipratropium-albuterol  3 mL Nebulization Q6H  . pantoprazole (PROTONIX) IV  40 mg Intravenous Q24H  .  thiamine  100 mg Intravenous Daily   Infusions:  . Marland KitchenTPN (CLINIMIX-E) Adult 83 mL/hr at 05/26/15 0600  . dexmedetomidine 0.3 mcg/kg/hr (05/26/15 0909)  . fentanyl 2500 mcg in NS 250 mL (10 mcg/mL) infusion 2.5 mL/hr at 05/26/15 0600    Insulin Requirements in the past 24 hours:  10  Current Nutrition:  TPN 5/20 at 83 mL/hr   Plan:  Will continue patient on SSI Q4hr while patient is on TPN. Electrolytes are WNL. Will f/u AM labs.   Pharmacy will continue to monitor and adjust per consult.    Ulice Dash D 05/26/2015,9:52 AM

## 2015-05-26 NOTE — Progress Notes (Signed)
Patient bladder scanner per Elink order; bladder scan results were 219. Results called back to Dr. West Carbo.

## 2015-05-26 NOTE — Progress Notes (Signed)
Folley irrigated with 60 mls; and foley drained 1450 of yellow urine with sediment. Results of foley irrigation called back to Dr. West Carbo; no new orders received.

## 2015-05-26 NOTE — Progress Notes (Signed)
Notified Dr. Adonis Huguenin of patient with absent right dorsalis pedis pulse and coldness noted to Right foot along with erythema noted to incision site. No new orders received at this time. Nursing to continue monitoring.

## 2015-05-26 NOTE — Consult Note (Signed)
PULMONARY / CRITICAL CARE MEDICINE   Name: Brady Smith MRN: [id number] DOB: 11/27/1945    ADMISSION DATE:  05/17/2015 CONSULTATION DATE:  02/26  REFERRING MD:  Adonis Huguenin  PT PROFILE: 70 M with history of heavy EtOH and heavy smoking history admitted early 02/23 with incarcerated ventral hernia and SBO. Underwent laparotomy 02/25 and was noted to have a difficult airway. Prior to surgery, there was concern for alcohol withdraw. Dr Adonis Huguenin desires that he remain on vent at least through 02/26 due to concern about stability of surgical wound.  Failure to wean from vent due to delerium  MAJOR EVENTS/TEST RESULTS: VENT DAY #6 02/23 Admission 02/23 IM consult 02/25 Laparotomy, strangulated small bowel, small bowel resection 03/01 EXTUBATED 03/02 RE-INTUBATED  INDWELLING DEVICES:: ETT 02/25 >>   MICRO DATA: Urine 02/23 >> NEG MRSA PCR 02/25 >> NEG REsp culture-CITROBACTER SPECIES ANTIMICROBIALS:  Ertapenem 02/25 >>    HISTORY OF PRESENT ILLNESS:   Patient is intubated,sedated On full vent support NG with bilious drainage last night    VITAL SIGNS: BP 84/54 mmHg  Pulse 73  Temp(Src) 100.4 F (38 C) (Rectal)  Resp 20  Ht 5\' 8"  (1.727 m)  Wt 180 lb 5.4 oz (81.8 kg)  BMI 27.43 kg/m2  SpO2 97%  HEMODYNAMICS:    VENTILATOR SETTINGS: Vent Mode:  [-] PRVC FiO2 (%):  [35 %-40 %] 35 % Set Rate:  [15 bmp] 15 bmp Vt Set:  [500 mL] 500 mL PEEP:  [5 cmH20] 5 cmH20 Plateau Pressure:  [16 cmH20-17 cmH20] 16 cmH20  INTAKE / OUTPUT: I/O last 3 completed shifts: In: 3904.4 [I.V.:548.6; IV Piggyback:300] Out: 36 [Urine:2910; Emesis/NG output:500; Drains:60]  PHYSICAL EXAMINATION: General: appears older than chronological age, intubated,  Neuro: gcs<8T HEENT: NCAT Cardiovascular: reg, no M Lungs: no wheezes, no adventitious sounds Abdomen: post op changes, abd soft, diminished to absent BS Ext: warm, no edema  LABS:  BMET  Recent Labs Lab 05/24/15 0455  05/25/15 0411 05/26/15 0455  NA 146* 142 141  K 4.0 3.4* 4.0  CL 111 106 106  CO2 32 32 30  BUN 29* 22* 23*  CREATININE 0.96 0.75 0.88  GLUCOSE 145* 167* 145*    Electrolytes  Recent Labs Lab 05/24/15 0455 05/25/15 0411 05/26/15 0455  CALCIUM 7.7* 7.3* 7.3*  MG 2.5* 2.2 2.2  PHOS 3.8 2.4* 2.9    CBC  Recent Labs Lab 05/23/15 0614 05/24/15 0455 05/25/15 0411  WBC 6.8 5.9 6.4  HGB 12.7* 12.5* 12.0*  HCT 38.5* 37.8* 35.5*  PLT 104* 96* 110*    Coag's No results for input(s): APTT, INR in the last 168 hours.  Sepsis Markers No results for input(s): LATICACIDVEN, PROCALCITON, O2SATVEN in the last 168 hours.  ABG  Recent Labs Lab 05/22/15 1100 05/24/15 1144 05/25/15 1544  PHART 7.43 7.41 7.46*  PCO2ART 31* 48 44  PO2ART 107 69* 84    Liver Enzymes  Recent Labs Lab 05/22/15 0522 05/23/15 0614  AST 64* 47*  ALT 41 55  ALKPHOS 28* 37*  BILITOT 0.8 0.9  ALBUMIN 1.9* 1.8*    Cardiac Enzymes No results for input(s): TROPONINI, PROBNP in the last 168 hours.  Glucose  Recent Labs Lab 05/25/15 1149 05/25/15 1637 05/25/15 2018 05/26/15 0027 05/26/15 0423 05/26/15 0726  GLUCAP 189* 106* 90 79 157* 130*    CXR: Minimal RLL atx   ASSESSMENT / PLAN: 70 yo white male with acute s/p laparotomy for acute small bowel infarction complicated by acute resp failure  and metabolic encephalopathy and acute etoh withdrawal requiring vent support with CITROBACTER PNEUMONIA  PULMONARY A: Post op vent status  Heavy smoker without prior dx of COPD, no overt airflow obstruction P:   Cont vent support - settings reviewed and/or adjusted Cont vent bundle Cont nebulized steroids and bronchodilators -will plan for weaning trails in next 48-72 hrs -patient may end up with trach  CARDIOVASCULAR A:  Transient post op hypotension - resolved P:  MAP goal > 65 mmHg  RENAL A:   -hypernatremia Borderline hypokalemia P:   Monitor BMET  intermittently Monitor I/Os Correct electrolytes as indicated changed to d5w  NA=151.Marland Kitchen..151....146...142.Marland KitchenMarland Kitchen141   GASTROINTESTINAL A:   Post laparotomy P:   SUP: IV PPI Started TPN, picc line placed -NG to suction  HEMATOLOGIC A:   Very mild thrombocytopenia P:  DVT px: LMWH Monitor CBC intermittently Transfuse per usual ICU guidelines  INFECTIOUS A:   Post laparotomy, small bowel infarction P:   Monitor temp, WBC count Micro and abx as above  ENDOCRINE A:   Mild hyperglycemia without prior dx of DM P:   Monitor glu on chem panels Consider SSI for glu > 180  NEUROLOGIC A:   Heavy ETOH use Post op pain P:   RASS goal: -2, -3 Cont Precedex infusion Initiate fentanyl infusion 02/26 Continue scheduled IV valium  FAMILY  No family @ bedside  The Patient requires high complexity decision making for assessment and support, frequent evaluation and titration of therapies, application of advanced monitoring technologies and extensive interpretation of multiple databases. Critical Care Time devoted to patient care services described in this note is 45 minutes.   Overall, patient is critically ill, prognosis is guarded.  Patient with Multiorgan failure and at high risk for cardiac arrest and death.  Plan for another trial of extubation in 1-2 days  Takerra Lupinacci Patricia Pesa, M.D.  [facility]  Medical Director [place] Director [place] Department        05/26/2015, 9:41 AM

## 2015-05-26 NOTE — Progress Notes (Signed)
[place] [facility] [place] NAME: Brady Smith    MR#:  [id number]  DATE OF BIRTH:  Jul 27, 1945  SUBJECTIVE:   Patient is here due to an incarcerated ventral hernia. Status post strangulated ventral hernia repair POD # 6 Ileus improving. NG tube drainage improving. Remains intubated but following commands today.  REVIEW OF SYSTEMS:    Review of Systems  Unable to perform ROS: dementia    Nutrition: NPO on TPN Tolerating Diet: No Tolerating PT: Await Eval.     DRUG ALLERGIES:  No Known Allergies  VITALS:  Blood pressure 84/54, pulse 73, temperature 100.4 F (38 C), temperature source Rectal, resp. rate 20, height 5\' 8"  (1.727 m), weight 81.8 kg (180 lb 5.4 oz), SpO2 98 %.  PHYSICAL EXAMINATION:   Physical Exam  GENERAL:  70 y.o.-year-old patient lying in the bed sedated & intubated.    EYES: Pupils equal, round, reactive to light. No scleral icterus.  HEENT: Head atraumatic, normocephalic. ET and NG tube in place.   NECK:  Supple, no jugular venous distention. No thyroid enlargement, no tenderness.  LUNGS: Normal breath sounds bilaterally, no wheezing, rales, rhonchi. No use of accessory muscles of respiration.  CARDIOVASCULAR: S1, S2 Irregular. No murmurs, rubs, or gallops.  ABDOMEN: Soft, nondistended. Hypoactive bowel sounds. Positive mid abdominal staples in place. Bilateral JP drains in place with serosanguineous drainage. EXTREMITIES: No cyanosis, clubbing or edema b/l.    NEUROLOGIC: Sedated and intubated but follows simple commands PSYCHIATRIC: Sedated and intubated but follows simple commands SKIN: No obvious rash, lesion, or ulcer.    LABORATORY PANEL:   CBC  Recent Labs Lab 05/25/15 0411  WBC 6.4  HGB 12.0*  HCT 35.5*  PLT 110*   ------------------------------------------------------------------------------------------------------------------  Chemistries   Recent Labs Lab 05/23/15 0614  05/26/15 0455   NA 151*  < > 141  K 3.9  < > 4.0  CL 117*  < > 106  CO2 31  < > 30  GLUCOSE 114*  < > 145*  BUN 36*  < > 23*  CREATININE 0.96  < > 0.88  CALCIUM 7.7*  < > 7.3*  MG 2.5*  < > 2.2  AST 47*  --   --   ALT 55  --   --   ALKPHOS 37*  --   --   BILITOT 0.9  --   --   < > = values in this interval not displayed. ------------------------------------------------------------------------------------------------------------------  Cardiac Enzymes No results for input(s): TROPONINI in the last 168 hours. ------------------------------------------------------------------------------------------------------------------  RADIOLOGY:  Portable Chest Xray  05/25/2015  CLINICAL DATA:  Hypoxia EXAM: PORTABLE CHEST 1 VIEW COMPARISON:  May 24, 2015 FINDINGS: Endotracheal tube tip is 2.5 cm above the carina. Central catheter tip is in the superior vena cava. Nasogastric tube tip and side port are in the stomach. No pneumothorax. There are small pleural effusions bilaterally with patchy bibasilar atelectasis. There is slight interstitial edema in the bases. Heart is borderline prominent with pulmonary vascularity within normal limits. No adenopathy evident. IMPRESSION: Tube and catheter positions as described without pneumothorax. Suspect a degree of congestive heart failure. There is patchy bibasilar atelectasis as well. No airspace consolidation evident. Electronically Signed   By: Lowella Grip III M.D.   On: 05/25/2015 09:14     ASSESSMENT AND PLAN:   70 year old male with past medical history of bladder cancer, pneumonia, alcohol abuse, hypertension who presented to the hospital due to abdominal pain and noted  to have a incarcerated ventral hernia.  #1 incarcerated ventral hernia-patient is status post Laparotomy and removal of necrotic bowel and incarcerated hernia repair. POD # 6.  -Noted to have a postoperative ileus which is improving. NG tube drainage improved.  -Continue TPN, and further care  as per general surgery.  #2 alcohol withdrawal-continue Precedex drip for now. -cont. CIWA protocol when extubated.   #3 acute respiratory failure-patient reintubated 3/1 due to encephalopathy and not noted to have ileus and possible aspiration pneumonia.  - sputum Cx + for Citrobacter.  Cont. Ertapenem.  - cont. Vent management as per Pulm.  Currently on 35% FiO2.  - cont. Weaning as per Pulmonary  #4 atrial fibrillation-remains in atrial fibrillation but rate controlled. -Echo done and shows normal EF.    -Hold on long long-term anticoagulation given the fact the patient recent surgery.  #5 fever of unknown origin- still having some low grade fevers. - Suspected to be due to aspiration pneumonia. Continue ertapenem. - hemodynamically stable.   #6 history of COPD-continue duo nebs when necessary. - cont. pulmicort nebs.   #7 hypernatremia- improved w/ D5W   All the records are reviewed and case discussed with Care Management/Social Workerr. Management plans discussed with the patient, family and they are in agreement.  CODE STATUS: Full Code  DVT Prophylaxis: Lovenox  TOTAL Critical Care TIME TAKING CARE OF THIS PATIENT: 30 minutes.   POSSIBLE D/C unclear and DEPENDING ON CLINICAL CONDITION.   Henreitta Leber M.D on 05/26/2015 at 4:40 PM  Between 7am to 6pm - Pager - [phone]  After 6pm go to [email or web address] - password [email or web address] [facility]  Office  [phone]  CC: Primary care physician; Halina Maidens, MD

## 2015-05-26 NOTE — Progress Notes (Signed)
6 Days Post-Op  Subjective: Status post exploratory laparotomy small bowel resection antral hernia repair patient remains intubated awakens to voice but is heavily sedated due to DTs  Objective: Vital signs in last 24 hours: Temp:  [99.1 F (37.3 C)-100.8 F (38.2 C)] 100.4 F (38 C) (03/03 0600) Pulse Rate:  [49-103] 73 (03/03 0600) Resp:  [15-31] 20 (03/03 0600) BP: (77-185)/(51-121) 84/54 mmHg (03/03 0600) SpO2:  [94 %-100 %] 98 % (03/03 0600) FiO2 (%):  [35 %-40 %] 35 % (03/03 0400) Weight:  [180 lb 5.4 oz (81.8 kg)] 180 lb 5.4 oz (81.8 kg) (03/03 0500)    Intake/Output from previous day: 03/02 0701 - 03/03 0700 In: 2403.8 [I.V.:367.9; IV Piggyback:300; TPN:1735.8] Out: 2890 [Urine:2360; Emesis/NG output:500; Drains:30] Intake/Output this shift:    Physical exam:  Wound is clean drains are functional abdomen is nontender  Lab Results: CBC   Recent Labs  05/24/15 0455 05/25/15 0411  WBC 5.9 6.4  HGB 12.5* 12.0*  HCT 37.8* 35.5*  PLT 96* 110*   BMET  Recent Labs  05/24/15 0455 05/25/15 0411  NA 146* 142  K 4.0 3.4*  CL 111 106  CO2 32 32  GLUCOSE 145* 167*  BUN 29* 22*  CREATININE 0.96 0.75  CALCIUM 7.7* 7.3*   PT/INR No results for input(s): LABPROT, INR in the last 72 hours. ABG  Recent Labs  05/24/15 1144 05/25/15 1544  PHART 7.41 7.46*  HCO3 30.4* 31.3*    Studies/Results: Portable Chest Xray  05/25/2015  CLINICAL DATA:  Hypoxia EXAM: PORTABLE CHEST 1 VIEW COMPARISON:  May 24, 2015 FINDINGS: Endotracheal tube tip is 2.5 cm above the carina. Central catheter tip is in the superior vena cava. Nasogastric tube tip and side port are in the stomach. No pneumothorax. There are small pleural effusions bilaterally with patchy bibasilar atelectasis. There is slight interstitial edema in the bases. Heart is borderline prominent with pulmonary vascularity within normal limits. No adenopathy evident. IMPRESSION: Tube and catheter positions as described  without pneumothorax. Suspect a degree of congestive heart failure. There is patchy bibasilar atelectasis as well. No airspace consolidation evident. Electronically Signed   By: Lowella Grip III M.D.   On: 05/25/2015 09:14    Anti-infectives: Anti-infectives    Start     Dose/Rate Route Frequency Ordered Stop   05/22/15 1800  ertapenem (INVANZ) 1 g in sodium chloride 0.9 % 50 mL IVPB     1 g 100 mL/hr over 30 Minutes Intravenous Every 24 hours 05/22/15 1022     05/21/15 1500  ertapenem (INVANZ) 1 g in sodium chloride 0.9 % 50 mL IVPB  Status:  Discontinued     1 g 100 mL/hr over 30 Minutes Intravenous Every 24 hours 05/20/15 1807 05/22/15 1022   05/20/15 1445  ertapenem (INVANZ) 1 g in sodium chloride 0.9 % 50 mL IVPB     1 g 100 mL/hr over 30 Minutes Intravenous  Once 05/20/15 1436     05/20/15 1415  ertapenem (INVANZ) 0.25 g in sodium chloride 0.9 % 50 mL IVPB     250 mg 100 mL/hr over 30 Minutes Intravenous  Once 05/20/15 1411        Assessment/Plan: s/p Procedure(s): EXPLORATORY LAPAROTOMY SMALL BOWEL RESECTION   Medical problems are holding this patient back from recovery at this point his abdominal exam is improved and his wound is clean. I don't expect any further improvement until he is extubated and could be mobilized. We'll continue current care  Antonisha Waskey  Melchor Amour, MD, FACS  05/26/2015

## 2015-05-26 NOTE — Progress Notes (Signed)
Nutrition Follow-up     INTERVENTION:   PN: recommend continuing 5%AA/20%Dextrose at rate of 83 ml/hr, continue to assess   NUTRITION DIAGNOSIS:   Inadequate oral intake related to acute illness, altered GI function as evidenced by NPO status. Being addressed via TPN  GOAL:   Provide needs based on ASPEN/SCCM guidelines  MONITOR:    (PN, Energy Intake, Anthropometrics, Electrolyte/Renal Profile, Glucose Profile)  REASON FOR ASSESSMENT:   Ventilator, Consult New TPN/TNA  ASSESSMENT:    Pt remains on vent  Diet Order:  .TPN (CLINIMIX-E) Adult .TPN (CLINIMIX-E) Adult   PN: 5%AA/20%Dextrose at rate of 83 ml/hr via PICC  Digestive System: abdomen distended, BS hypoactive, NG with 500 mL out  Urine Volume: UOP 2360 mL   Recent Labs Lab 05/24/15 0455 05/25/15 0411 05/26/15 0455  NA 146* 142 141  K 4.0 3.4* 4.0  CL 111 106 106  CO2 32 32 30  BUN 29* 22* 23*  CREATININE 0.96 0.75 0.88  CALCIUM 7.7* 7.3* 7.3*  MG 2.5* 2.2 2.2  PHOS 3.8 2.4* 2.9  GLUCOSE 145* 167* 145*    Glucose Profile:  Recent Labs  05/26/15 0027 05/26/15 0423 05/26/15 0726  GLUCAP 79 157* 130*   Meds: ss novolog, fentanyl, precedex  Height:   Ht Readings from Last 1 Encounters:  05/18/15 5\' 8"  (1.727 m)    Weight:   Wt Readings from Last 1 Encounters:  05/26/15 180 lb 5.4 oz (81.8 kg)    Filed Weights   05/24/15 0535 05/25/15 0500 05/26/15 0500  Weight: 172 lb 13.5 oz (78.4 kg) 174 lb 9.7 oz (79.2 kg) 180 lb 5.4 oz (81.8 kg)    BMI:  Body mass index is 27.43 kg/(m^2).  Estimated Nutritional Needs:   Kcal:  1852 kcals (Ve: 6.5,. Tmax: 38.3) using wt of 79.2 kg  Protein:  116-154 g(1.5-2.0 g/kg)   Fluid:  1925-2310 mL (25-30 ml/kg)   HIGH Care Level  Kerman Passey MS, RD, LDN [phone] Pager  [phone] Weekend/On-Call Pager

## 2015-05-26 NOTE — Care Management (Signed)
Barriers to DC: reintubated, TPN, continuous precedx for ciwa.  allow patient to have gi rest before reattempt extubation

## 2015-05-26 NOTE — Progress Notes (Signed)
Dr. Adonis Huguenin at patient bedside. Surgeon made aware of erythema and drainage noted to incision site along with coldness and absence of right pedal pulse. No new orders received. Nursing to continue to monitor.

## 2015-05-27 ENCOUNTER — Inpatient Hospital Stay: Payer: Medicare PPO

## 2015-05-27 ENCOUNTER — Encounter: Payer: Self-pay | Admitting: Radiology

## 2015-05-27 LAB — CBC WITH DIFFERENTIAL/PLATELET
Basophils Absolute: 0 10*3/uL (ref 0–0.1)
Basophils Relative: 0 %
Eosinophils Absolute: 0.1 10*3/uL (ref 0–0.7)
Eosinophils Relative: 1 %
HEMATOCRIT: 36.3 % — AB (ref 40.0–52.0)
Hemoglobin: 12 g/dL — ABNORMAL LOW (ref 13.0–18.0)
LYMPHS PCT: 5 %
Lymphs Abs: 0.4 10*3/uL — ABNORMAL LOW (ref 1.0–3.6)
MCH: 32.3 pg (ref 26.0–34.0)
MCHC: 33.1 g/dL (ref 32.0–36.0)
MCV: 97.6 fL (ref 80.0–100.0)
MONO ABS: 0.4 10*3/uL (ref 0.2–1.0)
MONOS PCT: 5 %
NEUTROS ABS: 6 10*3/uL (ref 1.4–6.5)
Neutrophils Relative %: 89 %
Platelets: 195 10*3/uL (ref 150–440)
RBC: 3.72 MIL/uL — ABNORMAL LOW (ref 4.40–5.90)
RDW: 13.6 % (ref 11.5–14.5)
WBC: 6.8 10*3/uL (ref 3.8–10.6)

## 2015-05-27 LAB — BASIC METABOLIC PANEL
ANION GAP: 3 — AB (ref 5–15)
BUN: 24 mg/dL — AB (ref 6–20)
CALCIUM: 7.4 mg/dL — AB (ref 8.9–10.3)
CO2: 32 mmol/L (ref 22–32)
CREATININE: 0.8 mg/dL (ref 0.61–1.24)
Chloride: 106 mmol/L (ref 101–111)
GFR calc Af Amer: >60 mL/min (ref >60)
GLUCOSE: 151 mg/dL — AB (ref 65–99)
Potassium: 3.7 mmol/L (ref 3.5–5.1)
Sodium: 141 mmol/L (ref 135–145)

## 2015-05-27 LAB — CULTURE, BLOOD (ROUTINE X 2)
Culture: NO GROWTH
Culture: NO GROWTH

## 2015-05-27 LAB — GLUCOSE, CAPILLARY
Glucose-Capillary: 134 mg/dL — ABNORMAL HIGH (ref 65–99)
Glucose-Capillary: 136 mg/dL — ABNORMAL HIGH (ref 65–99)
Glucose-Capillary: 138 mg/dL — ABNORMAL HIGH (ref 65–99)
Glucose-Capillary: 147 mg/dL — ABNORMAL HIGH (ref 65–99)
Glucose-Capillary: 154 mg/dL — ABNORMAL HIGH (ref 65–99)
Glucose-Capillary: 168 mg/dL — ABNORMAL HIGH (ref 65–99)
Glucose-Capillary: 176 mg/dL — ABNORMAL HIGH (ref 65–99)

## 2015-05-27 LAB — MAGNESIUM: Magnesium: 2.1 mg/dL (ref 1.7–2.4)

## 2015-05-27 LAB — PHOSPHORUS: Phosphorus: 3.5 mg/dL (ref 2.5–4.6)

## 2015-05-27 MED ORDER — IOHEXOL 350 MG/ML SOLN
100.0000 mL | Freq: Once | INTRAVENOUS | Status: AC | PRN
  Administered 2015-05-27: 100 mL via INTRAVENOUS

## 2015-05-27 MED ORDER — TRACE MINERALS CR-CU-MN-SE-ZN 10-1000-500-60 MCG/ML IV SOLN
INTRAVENOUS | Status: AC
  Administered 2015-05-27: 19:00:00 via INTRAVENOUS
  Filled 2015-05-27: qty 1992

## 2015-05-27 MED ORDER — IOHEXOL 240 MG/ML SOLN
25.0000 mL | INTRAMUSCULAR | Status: AC
Start: 2015-05-27 — End: 2015-05-27
  Administered 2015-05-27 (×2): 25 mL via ORAL

## 2015-05-27 NOTE — Progress Notes (Signed)
PULMONARY / CRITICAL CARE MEDICINE   Name: Brady Smith MRN: [id number] DOB: [birth year]    ADMISSION DATE:  05/17/2015 CONSULTATION DATE:  02/26  REFERRING MD:  Adonis Huguenin  History of present illness: 70 M with history of heavy EtOH and heavy smoking history admitted early 02/23 with incarcerated ventral hernia and SBO. Underwent laparotomy 02/25 and was noted to have a difficult airway. Prior to surgery, there was concern for alcohol withdraw. Dr Adonis Huguenin desires that he remain on vent at least through 02/26 due to concern about stability of surgical wound.  Failure to wean from vent due to delerium  Subjective  No acute issues overnight. Awake and following basic commands  VITAL SIGNS: BP 89/59 mmHg  Pulse 72  Temp(Src) 101.3 F (38.5 C) (Core (Comment))  Resp 23  Ht 5\' 8"  (1.727 m)  Wt 178 lb 12.7 oz (81.1 kg)  BMI 27.19 kg/m2  SpO2 98%  HEMODYNAMICS:    VENTILATOR SETTINGS: Vent Mode:  [-] PRVC FiO2 (%):  [35 %] 35 % Set Rate:  [15 bmp] 15 bmp Vt Set:  [500 mL] 500 mL PEEP:  [5 cmH20] 5 cmH20 Plateau Pressure:  [15 cmH20] 15 cmH20  INTAKE / OUTPUT: I/O last 3 completed shifts: In: 5777.1 [I.V.:545.1; JL:3343820; IV Piggyback:50] Out: [id number] [Urine:3150; Stool:1000]  PHYSICAL EXAMINATION: General: NAD Neuro: Moves extremities, follows basic commands HEENT: NCAT Cardiovascular: RRR, S1/S2, no MRG Lungs: Bilateral airflow, mild expiratory wheezes, coarse rhonchi, diminished breath sounds bilaterally Abdomen: post op changes, abd soft, diminished to absent BS Ext: warm, no edema  LABS:  BMET  Recent Labs Lab 05/25/15 0411 05/26/15 0455 05/27/15 0331  NA 142 141 141  K 3.4* 4.0 3.7  CL 106 106 106  CO2 32 30 32  BUN 22* 23* 24*  CREATININE 0.75 0.88 0.80  GLUCOSE 167* 145* 151*    Electrolytes  Recent Labs Lab 05/25/15 0411 05/26/15 0455 05/27/15 0331  CALCIUM 7.3* 7.3* 7.4*  MG 2.2 2.2 2.1  PHOS 2.4* 2.9 3.5    CBC  Recent Labs Lab  05/23/15 0614 05/24/15 0455 05/25/15 0411  WBC 6.8 5.9 6.4  HGB 12.7* 12.5* 12.0*  HCT 38.5* 37.8* 35.5*  PLT 104* 96* 110*    Coag's No results for input(s): APTT, INR in the last 168 hours.  Sepsis Markers No results for input(s): LATICACIDVEN, PROCALCITON, O2SATVEN in the last 168 hours.  ABG  Recent Labs Lab 05/22/15 1100 05/24/15 1144 05/25/15 1544  PHART 7.43 7.41 7.46*  PCO2ART 31* 48 44  PO2ART 107 69* 84    Liver Enzymes  Recent Labs Lab 05/22/15 0522 05/23/15 0614  AST 64* 47*  ALT 41 55  ALKPHOS 28* 37*  BILITOT 0.8 0.9  ALBUMIN 1.9* 1.8*    Cardiac Enzymes No results for input(s): TROPONINI, PROBNP in the last 168 hours.  Glucose  Recent Labs Lab 05/26/15 1622 05/26/15 2008 05/26/15 2331 05/27/15 0340 05/27/15 0719 05/27/15 1136  GLUCAP 167* 120* 156* 138* 168* 176*    Ct Abdomen Pelvis W Contrast  05/27/2015  CLINICAL DATA:  One week postop from bowel resection for bowel obstruction. Fever. Bladder carcinoma. EXAM: CT ABDOMEN AND PELVIS WITH CONTRAST TECHNIQUE: Multidetector CT imaging of the abdomen and pelvis was performed using the standard protocol following bolus administration of intravenous contrast. CONTRAST:  132mL OMNIPAQUE IOHEXOL 350 MG/ML SOLN, 1 OMNIPAQUE IOHEXOL 240 MG/ML SOLN COMPARISON:  05/17/2015 FINDINGS: Lower chest: New small bilateral pleural effusions and bibasilar atelectasis demonstrated. Hepatobiliary: No masses or other  significant abnormality. Prior cholecystectomy noted. No evidence of biliary dilatation. Pancreas: No mass, inflammatory changes, or other significant abnormality. Spleen: Within normal limits in size and appearance. Adrenals/Urinary Tract: No evidence of adrenal or renal masses. Tiny sub-cm right renal cyst remains stable. No evidence of hydronephrosis. Patient has undergone cystectomy with ileal loop diversion into pelvic neobladder. A Foley catheter is seen within the neobladder which is decompressed.  Stomach/Bowel: Recent postsurgical changes are seen from repair of large ventral abdominal wall hernia. Mild wall thickening of the transverse colon is seen near the site of the hernia repair likely due to postop edema. No evidence of obstruction. There is no evidence of recurrent hernia. No evidence of abscess or free air. Vascular/Lymphatic: No pathologically enlarged lymph nodes. 3.0 cm infrarenal abdominal aortic aneurysm remains stable. No evidence of aneurysm leak or rupture. Reproductive: No mass or other significant abnormality. Other: None. Musculoskeletal:  No suspicious bone lesions identified. IMPRESSION: Expected postoperative changes from repair recent ventral abdominal wall hernia. Low residual transverse colonic wall thickening near the hernia site is likely due to postop edema. No evidence of bowel obstruction or abscess. New small bilateral pleural effusions and bibasilar atelectasis. Stable postop changes from previous cystectomy and ileal loop diversion into pelvic neobladder. No evidence of hydronephrosis. No evidence of recurrent or metastatic carcinoma. Stable 3.0 cm infrarenal abdominal aortic aneurysm. Recommend followup by ultrasound in 3 years. This recommendation follows ACR consensus guidelines: White Paper of the ACR Incidental Findings Committee II on Vascular Findings. Natasha Mead Coll Radiol 2013; 10:789-794 Electronically Signed   By: Earle Gell M.D.   On: 05/27/2015 16:07   MAJOR EVENTS/TEST RESULTS: VENT DAY #6 02/23 Admission 02/23 IM consult 02/25 Laparotomy, strangulated small bowel, small bowel resection 03/01 EXTUBATED 03/02 RE-INTUBATED  INDWELLING DEVICES:: ETT 02/25 >>   MICRO DATA: Urine 02/23 >> NEG MRSA PCR 02/25 >> NEG REsp culture-CITROBACTER SPECIES ANTIMICROBIALS:  Ertapenem 02/25 >>   Discussion 70 yo white male with acute s/p laparotomy for acute small bowel infarction complicated by acute resp failure and metabolic encephalopathy and acute etoh  withdrawal requiring vent support with CITROBACTER PNEUMONIA  ASSESSMENT / PLAN: PULMONARY A: Post op vent status  Heavy smoker without prior dx of COPD, no overt airflow obstruction P:   Cont vent support - settings reviewed and/or adjusted Cont vent bundle Cont nebulized steroids and bronchodilators -Weaning trials as tolerated -Patient may need trach due to multiple failed SBT trials  CARDIOVASCULAR A:  Transient post op hypotension - resolved P:  MAP goal > 65 mmHg  RENAL A:   -Hypernatremia-resolved Borderline hypokalemia-resolved P:   Monitor BMET intermittently Monitor I/Os Correct electrolytes as indicated D/C D5W   GASTROINTESTINAL A:   Acute small bowel infarction S/P laparotomy P:   SUP: IV PPI Continue TPN NGT to suction  HEMATOLOGIC A:   Very mild thrombocytopenia-resolved P:  DVT px: LMWH Monitor CBC intermittently Transfuse per usual ICU guidelines  INFECTIOUS A:   Abdominal infection s/p laparotomy for small bowel infarction P:   Monitor temp, WBC count Micro and abx as above  ENDOCRINE A:   Mild hyperglycemia without prior dx of DM P:   Blood glucose monitoring every 4 hours with sliding scale insulin coverage per protocol   NEUROLOGIC A:   Heavy ETOH use Post op pain P:   RASS goal: -2, -3 Cont Precedex infusion Initiate fentanyl infusion 02/26 Continue scheduled IV valium  FAMILY  No family @ bedside  Best Practice: Code Status:  Full.  Diet: NPO/TPN GI prophylaxis:  PPI. VTE prophylaxis:  SCD's / Lovenox.  [facility] ANP-BC Pulmonary and [place] Pager [phone]    05/27/2015, 12:08 PM  The patient was seen and examined with the nurse practitioner, I have reviewed and agree with the assessment and plan as stated above.  Marda Stalker, M.D.   Critical Care Attestation.  I have personally obtained a history, examined the patient, evaluated laboratory and imaging  results, formulated the assessment and plan and placed orders. The Patient requires high complexity decision making for assessment and support, frequent evaluation and titration of therapies, application of advanced monitoring technologies and extensive interpretation of multiple databases. The patient has critical illness that could lead imminently to failure of 1 or more organ systems and requires the highest level of physician preparedness to intervene.  Critical Care Time devoted to patient care services described in this note is 35 minutes and is exclusive of time spent in procedures.

## 2015-05-27 NOTE — Progress Notes (Signed)
Nutrition Follow-up   INTERVENTION:   PN: Recommend continuing 5%AA/20%Dextrose at 72mL/hr as ordered. Will continue to follow and make recommendations accordingly.   NUTRITION DIAGNOSIS:   Inadequate oral intake related to acute illness, altered GI function as evidenced by NPO status.Being addressed via TPN.  GOAL:   Provide needs based on ASPEN/SCCM guidelines  MONITOR:    (PN, Energy Intake, Anthropometrics, Electrolyte/Renal Profile, Glucose Profile)  REASON FOR ASSESSMENT:   Ventilator, Consult New TPN/TNA  ASSESSMENT:    Pt remains sedated on the vent. POD7. RD notes per surgical MD note this am, if unable to extubate could possibly start EN as NG tube output is decreased. Plan to continue TPN until able to tolerate EN.  Diet Order:  .TPN (CLINIMIX-E) Adult .TPN (CLINIMIX-E) Adult    PN: tolerating 5%AA/20%Dextrose at 50mL/hr   Gastrointestinal Profile: abdomen distended, faint BS  UOP: 855mL, >2L yesterday documented  Scheduled Medications:  . antiseptic oral rinse  7 mL Mouth Rinse QID  . budesonide (PULMICORT) nebulizer solution  0.5 mg Nebulization BID  . chlorhexidine gluconate  15 mL Mouth Rinse BID  . diazepam  5 mg Intravenous Q6H  . enoxaparin (LOVENOX) injection  40 mg Subcutaneous Q24H  . ertapenem (INVANZ) IV  250 mg Intravenous Once  . ertapenem (INVANZ) IV  1 g Intravenous Once  . ertapenem  1 g Intravenous Q24H  . insulin aspart  2-6 Units Subcutaneous 6 times per day  . ipratropium-albuterol  3 mL Nebulization Q6H  . pantoprazole (PROTONIX) IV  40 mg Intravenous Q24H  . thiamine  100 mg Intravenous Daily    Continuous Medications:  . Marland KitchenTPN (CLINIMIX-E) Adult 83 mL/hr at 05/27/15 0700  . Marland KitchenTPN (CLINIMIX-E) Adult    . dexmedetomidine 0.3 mcg/kg/hr (05/27/15 0700)  . fentanyl 2500 mcg in NS 250 mL (10 mcg/mL) infusion 10 mL/hr at 05/27/15 0813     Electrolyte/Renal Profile and Glucose Profile:   Recent Labs Lab 05/25/15 0411  05/26/15 0455 05/27/15 0331  NA 142 141 141  K 3.4* 4.0 3.7  CL 106 106 106  CO2 32 30 32  BUN 22* 23* 24*  CREATININE 0.75 0.88 0.80  CALCIUM 7.3* 7.3* 7.4*  MG 2.2 2.2 2.1  PHOS 2.4* 2.9 3.5  GLUCOSE 167* 145* 151*   Protein Profile:  Recent Labs Lab 05/22/15 0522 05/23/15 0614  ALBUMIN 1.9* 1.8*     Weight Trend since Admission: Filed Weights   05/25/15 0500 05/26/15 0500 05/27/15 0700  Weight: 174 lb 9.7 oz (79.2 kg) 180 lb 5.4 oz (81.8 kg) 178 lb 12.7 oz (81.1 kg)     BMI:  Body mass index is 27.19 kg/(m^2).  Estimated Nutritional Needs:   Kcal:  1852 kcals (Ve: 6.5,. Tmax: 38.3) using wt of 79.2 kg  Protein:  116-154 g(1.5-2.0 g/kg)   Fluid:  1925-2310 mL (25-30 ml/kg)    HIGH Care Level  Brady Smith, RD, LDN Pager [phone] Weekend/On-Call Pager [phone]

## 2015-05-27 NOTE — Progress Notes (Signed)
Patient ID: Brady Smith, male   DOB: Jun 06, 1945, 70 y.o.   MRN: [id number] [facility] PROGRESS NOTE  Brady Smith [id number] DOB: 1945/11/24 DOA: 05/17/2015 PCP: Halina Maidens, MD  HPI/Subjective: Patient follows some simple commands and able to lift arm off the bed to command  Objective: Filed Vitals:   05/27/15 1000 05/27/15 1100  BP: 82/56 89/59  Pulse: 61 72  Temp: 101.3 F (38.5 C) 101.3 F (38.5 C)  Resp: 22 23    Filed Weights   05/25/15 0500 05/26/15 0500 05/27/15 0700  Weight: 79.2 kg (174 lb 9.7 oz) 81.8 kg (180 lb 5.4 oz) 81.1 kg (178 lb 12.7 oz)    ROS: Review of Systems  Unable to perform ROS very linited with patient being on ventilator Exam: Physical Exam  HENT:  Nose: No mucosal edema.  Mouth/Throat: No oropharyngeal exudate or posterior oropharyngeal edema.  Eyes: Conjunctivae, EOM and lids are normal. Pupils are equal, round, and reactive to light.  Neck: No JVD present. Carotid bruit is not present. No edema present. No thyroid mass and no thyromegaly present.  Cardiovascular: S1 normal and S2 normal.  Exam reveals no gallop.   No murmur heard. Pulses:      Dorsalis pedis pulses are 2+ on the right side, and 2+ on the left side.  Respiratory: No respiratory distress. He has wheezes in the right lower field and the left lower field. He has no rhonchi. He has no rales.  GI: Soft. He exhibits distension. Bowel sounds are decreased. There is generalized tenderness.  Musculoskeletal:       Right ankle: He exhibits no swelling.       Left ankle: He exhibits no swelling.  Lymphadenopathy:    He has no cervical adenopathy.  Neurological: He is alert.  Able to lift up arms up off the bed  Skin: Skin is warm. No rash noted. Nails show no clubbing.  Psychiatric: He has a normal mood and affect.      Data Reviewed: Basic Metabolic Panel:  Recent Labs Lab 05/23/15 0614 05/24/15 0455 05/25/15 0411 05/26/15 0455 05/27/15 0331  NA  151* 146* 142 141 141  K 3.9 4.0 3.4* 4.0 3.7  CL 117* 111 106 106 106  CO2 31 32 32 30 32  GLUCOSE 114* 145* 167* 145* 151*  BUN 36* 29* 22* 23* 24*  CREATININE 0.96 0.96 0.75 0.88 0.80  CALCIUM 7.7* 7.7* 7.3* 7.3* 7.4*  MG 2.5* 2.5* 2.2 2.2 2.1  PHOS 3.8 3.8 2.4* 2.9 3.5   Liver Function Tests:  Recent Labs Lab 05/22/15 0522 05/23/15 0614  AST 64* 47*  ALT 41 55  ALKPHOS 28* 37*  BILITOT 0.8 0.9  PROT 5.2* 4.9*  ALBUMIN 1.9* 1.8*   CBC:  Recent Labs Lab 05/21/15 0737 05/22/15 0522 05/23/15 0614 05/24/15 0455 05/25/15 0411  WBC 6.1 5.7 6.8 5.9 6.4  HGB 14.9 14.1 12.7* 12.5* 12.0*  HCT 44.7 42.4 38.5* 37.8* 35.5*  MCV 96.3 96.4 97.0 98.3 94.4  PLT 139* 116* 104* 96* 110*    CBG:  Recent Labs Lab 05/26/15 2008 05/26/15 2331 05/27/15 0340 05/27/15 0719 05/27/15 1136  GLUCAP 120* 156* 138* 168* 176*    Recent Results (from the past 240 hour(s))  Urine culture     Status: None   Collection Time: 05/18/15  9:21 AM  Result Value Ref Range Status   Specimen Description URINE, CATHETERIZED  Final   Special Requests Normal  Final   Culture  NO GROWTH 1 DAY  Final   Report Status 05/19/2015 FINAL  Final  MRSA PCR Screening     Status: None   Collection Time: 05/20/15  5:08 PM  Result Value Ref Range Status   MRSA by PCR NEGATIVE NEGATIVE Final    Comment:        The GeneXpert MRSA Assay (FDA approved for NASAL specimens only), is one component of a comprehensive MRSA colonization surveillance program. It is not intended to diagnose MRSA infection nor to guide or monitor treatment for MRSA infections.   Culture, respiratory (NON-Expectorated)     Status: None   Collection Time: 05/22/15 10:29 AM  Result Value Ref Range Status   Specimen Description TRACHEAL ASPIRATE  Final   Special Requests NONE  Final   Gram Stain   Final    FEW WBC SEEN RARE SQUAMOUS EPITHELIAL CELLS PRESENT NO ORGANISMS SEEN    Culture LIGHT GROWTH CITROBACTER BRAAKII   Final   Report Status 05/25/2015 FINAL  Final   Organism ID, Bacteria CITROBACTER BRAAKII  Final      Susceptibility   Citrobacter braakii - MIC*    CEFAZOLIN >=64 RESISTANT Resistant     CEFEPIME <=1 SENSITIVE Sensitive     CEFTAZIDIME <=1 SENSITIVE Sensitive     CEFTRIAXONE <=1 SENSITIVE Sensitive     CIPROFLOXACIN <=0.25 SENSITIVE Sensitive     GENTAMICIN <=1 SENSITIVE Sensitive     IMIPENEM 0.5 SENSITIVE Sensitive     TRIMETH/SULFA <=20 SENSITIVE Sensitive     PIP/TAZO <=4 SENSITIVE Sensitive     * LIGHT GROWTH CITROBACTER BRAAKII  CULTURE, BLOOD (ROUTINE X 2) w Reflex to PCR ID Panel     Status: None   Collection Time: 05/22/15 10:37 AM  Result Value Ref Range Status   Specimen Description BLOOD LT [facility]  Final   Special Requests BOTTLES DRAWN AEROBIC AND ANAEROBIC 7ML ANA 7MLAER  Final   Culture NO GROWTH 5 DAYS  Final   Report Status 05/27/2015 FINAL  Final  CULTURE, BLOOD (ROUTINE X 2) w Reflex to PCR ID Panel     Status: None   Collection Time: 05/22/15 10:50 AM  Result Value Ref Range Status   Specimen Description BLOOD RT AC  Final   Special Requests   Final    BOTTLES DRAWN AEROBIC AND ANAEROBIC 7ML ANA 7ML AER   Culture NO GROWTH 5 DAYS  Final   Report Status 05/27/2015 FINAL  Final  Urine culture     Status: None   Collection Time: 05/22/15 11:47 AM  Result Value Ref Range Status   Specimen Description URINE, RANDOM  Final   Special Requests NONE  Final   Culture NO GROWTH 1 DAY  Final   Report Status 05/23/2015 FINAL  Final     Scheduled Meds: . antiseptic oral rinse  7 mL Mouth Rinse QID  . budesonide (PULMICORT) nebulizer solution  0.5 mg Nebulization BID  . chlorhexidine gluconate  15 mL Mouth Rinse BID  . diazepam  5 mg Intravenous Q6H  . enoxaparin (LOVENOX) injection  40 mg Subcutaneous Q24H  . ertapenem (INVANZ) IV  250 mg Intravenous Once  . ertapenem (INVANZ) IV  1 g Intravenous Once  . ertapenem  1 g Intravenous Q24H  . insulin aspart  2-6 Units  Subcutaneous 6 times per day  . ipratropium-albuterol  3 mL Nebulization Q6H  . pantoprazole (PROTONIX) IV  40 mg Intravenous Q24H  . thiamine  100 mg Intravenous Daily   Continuous  Infusions: . Marland KitchenTPN (CLINIMIX-E) Adult 83 mL/hr at 05/27/15 0700  . Marland KitchenTPN (CLINIMIX-E) Adult    . dexmedetomidine 0.3 mcg/kg/hr (05/27/15 0700)  . fentanyl 2500 mcg in NS 250 mL (10 mcg/mL) infusion 10 mL/hr at 05/27/15 0813    Assessment/Plan:  1. Persistent fever. We'll get a CT scan of the abdomen and pelvis to rule out abscess. Case discussed with Dr. Tollie Pizza surgery covering. We'll get a chest x-ray. Send another set of blood cultures. 2. Incarcerated ventral hernia. Status post removal of necrotic bowel and incarcerated hernia repair. Patient currently on TPN 3. Alcohol withdrawal. Patient should be through withdrawal process by now. 4. Acute respiratory failure with hypoxia. Patient was reintubated 05/24/2015 secondary to encephalopathy, ileus and possible aspiration pneumonia. Sputum culture positive for Citrobacter on ertapenem. Patient currently on 35% FiO2. 5. Atrial fibrillation. Rate controlled. Hold anticoagulation at this point. 6. History of COPD with slight expiratory wheeze continue Pulmicort nebulizers 7. Hypernatremia improved with D5W  Code Status:     Code Status Orders        Start     Ordered   05/18/15 0352  Full code   Continuous     05/18/15 0351    Code Status History    Date Active Date Inactive Code Status Order ID Comments User Context   This patient has a current code status but no historical code status.    Advance Directive Documentation        Most Recent Value   Type of Advance Directive  Healthcare Power of Attorney   Pre-existing out of facility DNR order (yellow form or pink MOST form)     "MOST" Form in Place?       Disposition Plan: To be determined  Consultants:  Critical care specialist  Surgery  Antibiotics:  Ertapenem  Time spent: 35 minutes  critical care time  Loletha Grayer  [facility]

## 2015-05-27 NOTE — Progress Notes (Signed)
7 Days Post-Op   Subjective:  Patient remains intubated sedated. Per nursing one attempt to wean sedation patient will thrash and pulled things in bed. He does arouse to stimuli even while on sedation. Otherwise no acute events overnight.  Vital signs in last 24 hours: Temp:  [100 F (37.8 C)-101.3 F (38.5 C)] 100.6 F (38.1 C) (03/04 0600) Pulse Rate:  [64-106] 82 (03/04 0600) Resp:  [18-29] 24 (03/04 0600) BP: (82-168)/(54-103) 121/66 mmHg (03/04 0600) SpO2:  [97 %-100 %] 98 % (03/04 0600) FiO2 (%):  [35 %] 35 % (03/04 0400)    Intake/Output from previous day: 03/03 0701 - 03/04 0700 In: 4281 [I.V.:359.4; IV Piggyback:50; TPN:3646.6] Out: 1850 [Urine:850; Stool:1000]  Physical exam: General: Resting in bed intubated and sedated no acute distress Chest: Coarse breath sounds throughout, mechanically ventilated Heart: Regular rate and rhythm GI: Midline incision well approximated with staples. Minimal serous drainage from the bottom aspect of the wound. Bilateral JP drains draining serosanguineous fluid. Some chronic skin changes from his previously incarcerated hernia. No blanching erythema or signs of peritonitis on exam  Extremities: Right foot is slightly cooler than the left. There is a palpable PT pulse bilaterally and a palpable DP pulse on the left but no palpable DP pulse on the right. The foot itself is pink with Refill of 2 seconds.  Lab Results:  CBC  Recent Labs  05/25/15 0411  WBC 6.4  HGB 12.0*  HCT 35.5*  PLT 110*   CMP     Component Value Date/Time   NA 141 05/27/2015 0331   NA 141 07/28/2013 0444   K 3.7 05/27/2015 0331   K 4.6 07/28/2013 0444   CL 106 05/27/2015 0331   CL 111* 07/28/2013 0444   CO2 32 05/27/2015 0331   CO2 26 07/28/2013 0444   GLUCOSE 151* 05/27/2015 0331   GLUCOSE 86 07/28/2013 0444   BUN 24* 05/27/2015 0331   BUN 6* 07/28/2013 0444   CREATININE 0.80 05/27/2015 0331   CREATININE 1.13 07/28/2013 0444   CALCIUM 7.4*  05/27/2015 0331   CALCIUM 8.7 07/28/2013 0444   PROT 4.9* 05/23/2015 0614   PROT 7.5 07/27/2013 1041   ALBUMIN 1.8* 05/23/2015 0614   ALBUMIN 3.9 07/27/2013 1041   AST 47* 05/23/2015 0614   AST 17 07/27/2013 1041   ALT 55 05/23/2015 0614   ALT 20 07/27/2013 1041   ALKPHOS 37* 05/23/2015 0614   ALKPHOS 81 07/27/2013 1041   BILITOT 0.9 05/23/2015 0614   BILITOT 0.5 07/27/2013 1041   GFRNONAA >60 05/27/2015 0331   GFRNONAA >60 07/28/2013 0444   GFRNONAA >60 04/13/2011 0325   GFRAA >60 05/27/2015 0331   GFRAA >60 07/28/2013 0444   GFRAA >60 04/13/2011 0325   PT/INR No results for input(s): LABPROT, INR in the last 72 hours.  Studies/Results: Portable Chest Xray  05/25/2015  CLINICAL DATA:  Hypoxia EXAM: PORTABLE CHEST 1 VIEW COMPARISON:  May 24, 2015 FINDINGS: Endotracheal tube tip is 2.5 cm above the carina. Central catheter tip is in the superior vena cava. Nasogastric tube tip and side port are in the stomach. No pneumothorax. There are small pleural effusions bilaterally with patchy bibasilar atelectasis. There is slight interstitial edema in the bases. Heart is borderline prominent with pulmonary vascularity within normal limits. No adenopathy evident. IMPRESSION: Tube and catheter positions as described without pneumothorax. Suspect a degree of congestive heart failure. There is patchy bibasilar atelectasis as well. No airspace consolidation evident. Electronically Signed   By: Gwyndolyn Saxon  Jasmine December III M.D.   On: 05/25/2015 09:14    Assessment/Plan: 70 year old male now 7 days status post exploratory laparotomy with small bowel resection, anastomosis, ventral hernia repair secondary to a strangulated loop of small bowel within a chronic midline ventral hernia. Patient appears to be resting comfortably on current sedation. Appreciate internal medicine and critical care assistance with this complicated patient. Nursing called last night due to concerns of the right foot being cool the  left. When evaluated last night through the same temperature this morning the right foot is slightly cooler than the left. There is no mottling or signs of concern for viability of the foot. Would obtain a routine vascular consult over the next couple of days as the patient progresses from his abdominal surgery.  Likely remove one of his superficial JP drains tomorrow as long as output remains minimal. Ventilatory management per critical care. Continue on TPN until able to tolerate enteral nutrition. If unable to extubate today could possibly start enteral feeds through his NG tube as his NG output has been low.   Clayburn Pert, MD FACS General Surgeon  05/27/2015

## 2015-05-27 NOTE — Progress Notes (Signed)
[place] NOTE   Pharmacy Consult for electrolytes and insulin management Indication: ICU patient requiring mechanical ventilation and TPN  No Known Allergies  Patient Measurements: Height: 5\' 8"  (172.7 cm) Weight: 180 lb 5.4 oz (81.8 kg) IBW/kg (Calculated) : 68.4   Vital Signs: Temp: 100.6 F (38.1 C) (03/04 0600) Temp Source: Core (Comment) (03/03 2000) BP: 121/66 mmHg (03/04 0600) Pulse Rate: 82 (03/04 0600) Intake/Output from previous day: 03/03 0701 - 03/04 0700 In: 4281 [I.V.:359.4; IV Piggyback:50; [id number] Out: 1850 [Urine:850; Stool:1000] Intake/Output from this shift:    Labs:  Recent Labs  05/25/15 0411  WBC 6.4  HGB 12.0*  HCT 35.5*  PLT 110*     Recent Labs  05/25/15 0411 05/26/15 0455 05/27/15 0331  NA 142 141 141  K 3.4* 4.0 3.7  CL 106 106 106  CO2 32 30 32  GLUCOSE 167* 145* 151*  BUN 22* 23* 24*  CREATININE 0.75 0.88 0.80  CALCIUM 7.3* 7.3* 7.4*  MG 2.2 2.2 2.1  PHOS 2.4* 2.9 3.5   Estimated Creatinine Clearance: 84.3 mL/min (by C-G formula based on Cr of 0.8).    Recent Labs  05/26/15 2008 05/26/15 2331 05/27/15 0340  GLUCAP 120* 156* 138*    Medical History: Past Medical History  Diagnosis Date  . History of kidney cancer   . Bladder cancer ([place])   . Pneumonia     Medications:  Scheduled:  . antiseptic oral rinse  7 mL Mouth Rinse QID  . budesonide (PULMICORT) nebulizer solution  0.5 mg Nebulization BID  . chlorhexidine gluconate  15 mL Mouth Rinse BID  . diazepam  5 mg Intravenous Q6H  . enoxaparin (LOVENOX) injection  40 mg Subcutaneous Q24H  . ertapenem (INVANZ) IV  250 mg Intravenous Once  . ertapenem (INVANZ) IV  1 g Intravenous Once  . ertapenem  1 g Intravenous Q24H  . insulin aspart  2-6 Units Subcutaneous 6 times per day  . ipratropium-albuterol  3 mL Nebulization Q6H  . pantoprazole (PROTONIX) IV  40 mg Intravenous Q24H  . thiamine  100 mg Intravenous Daily   Infusions:  .  Marland KitchenTPN (CLINIMIX-E) Adult 83 mL/hr at 05/27/15 0600  . dexmedetomidine 0.301 mcg/kg/hr (05/27/15 0600)  . fentanyl 2500 mcg in NS 250 mL (10 mcg/mL) infusion 10 mL/hr at 05/27/15 0600    Insulin Requirements in the past 24 hours:  10  Current Nutrition:  TPN 5/20 at 83 mL/hr   Plan:  Will continue patient on SSI Q4hr while patient is on TPN. Electrolytes are WNL. Will f/u AM labs.   Pharmacy will continue to monitor and adjust per consult.    Laural Benes, Pharm.D., BCPS Clinical Pharmacist 05/27/2015,7:01 AM

## 2015-05-28 ENCOUNTER — Inpatient Hospital Stay: Payer: Medicare PPO

## 2015-05-28 LAB — CBC
HCT: 32.8 % — ABNORMAL LOW (ref 40.0–52.0)
Hemoglobin: 11.1 g/dL — ABNORMAL LOW (ref 13.0–18.0)
MCH: 31.7 pg (ref 26.0–34.0)
MCHC: 33.8 g/dL (ref 32.0–36.0)
MCV: 94 fL (ref 80.0–100.0)
Platelets: 206 10*3/uL (ref 150–440)
RBC: 3.49 MIL/uL — ABNORMAL LOW (ref 4.40–5.90)
RDW: 13.3 % (ref 11.5–14.5)
WBC: 6.1 10*3/uL (ref 3.8–10.6)

## 2015-05-28 LAB — BLOOD GAS, ARTERIAL
Acid-Base Excess: 6.6 mmol/L — ABNORMAL HIGH (ref 0.0–3.0)
Allens test (pass/fail): POSITIVE — AB
BICARBONATE: 31.9 meq/L — AB (ref 21.0–28.0)
FIO2: 0.35
Mode: POSITIVE
O2 SAT: 95.9 %
PATIENT TEMPERATURE: 37
PEEP: 5 cmH2O
PH ART: 7.44 (ref 7.350–7.450)
PO2 ART: 78 mmHg — AB (ref 83.0–108.0)
PRESSURE SUPPORT: 5 cmH2O
pCO2 arterial: 47 mmHg (ref 32.0–48.0)

## 2015-05-28 LAB — GLUCOSE, CAPILLARY
GLUCOSE-CAPILLARY: 147 mg/dL — AB (ref 65–99)
GLUCOSE-CAPILLARY: 178 mg/dL — AB (ref 65–99)
Glucose-Capillary: 149 mg/dL — ABNORMAL HIGH (ref 65–99)
Glucose-Capillary: 157 mg/dL — ABNORMAL HIGH (ref 65–99)
Glucose-Capillary: 160 mg/dL — ABNORMAL HIGH (ref 65–99)
Glucose-Capillary: 176 mg/dL — ABNORMAL HIGH (ref 65–99)

## 2015-05-28 LAB — MAGNESIUM: MAGNESIUM: 2.2 mg/dL (ref 1.7–2.4)

## 2015-05-28 LAB — BASIC METABOLIC PANEL
ANION GAP: 5 (ref 5–15)
BUN: 23 mg/dL — ABNORMAL HIGH (ref 6–20)
CALCIUM: 7.4 mg/dL — AB (ref 8.9–10.3)
CO2: 31 mmol/L (ref 22–32)
CREATININE: 0.86 mg/dL (ref 0.61–1.24)
Chloride: 102 mmol/L (ref 101–111)
GFR calc non Af Amer: >60 mL/min (ref >60)
Glucose, Bld: 179 mg/dL — ABNORMAL HIGH (ref 65–99)
Potassium: 3.6 mmol/L (ref 3.5–5.1)
SODIUM: 138 mmol/L (ref 135–145)

## 2015-05-28 LAB — PHOSPHORUS: PHOSPHORUS: 3.6 mg/dL (ref 2.5–4.6)

## 2015-05-28 MED ORDER — NALOXONE HCL 0.4 MG/ML IJ SOLN
INTRAMUSCULAR | Status: AC
  Filled 2015-05-28: qty 1

## 2015-05-28 MED ORDER — DIAZEPAM 5 MG/ML IJ SOLN: 2.5000 mg | Freq: Four times a day (QID) | INTRAMUSCULAR | Status: DC

## 2015-05-28 MED ORDER — VITAL HIGH PROTEIN PO LIQD
1000.0000 mL | ORAL | Status: DC
  Administered 2015-05-28 (×3)
  Administered 2015-05-28: 1000 mL
  Administered 2015-05-28 – 2015-05-29 (×9)
  Administered 2015-05-29: 1000 mL
  Administered 2015-05-29: 05:00:00

## 2015-05-28 MED ORDER — CIPROFLOXACIN IN D5W 400 MG/200ML IV SOLN
400.0000 mg | Freq: Two times a day (BID) | INTRAVENOUS | Status: DC
  Administered 2015-05-28 – 2015-05-31 (×6): 400 mg via INTRAVENOUS
  Filled 2015-05-28 (×7): qty 200

## 2015-05-28 MED ORDER — DIAZEPAM 5 MG/ML IJ SOLN
2.5000 mg | Freq: Two times a day (BID) | INTRAMUSCULAR | Status: DC
  Administered 2015-05-28 – 2015-05-29 (×3): 2.5 mg via INTRAVENOUS
  Filled 2015-05-28 (×3): qty 2

## 2015-05-28 MED ORDER — STERILE WATER FOR INJECTION IJ SOLN
INTRAMUSCULAR | Status: AC
  Administered 2015-05-28: 10 mL
  Filled 2015-05-28: qty 10

## 2015-05-28 MED ORDER — ACETAMINOPHEN 160 MG/5ML PO SOLN
650.0000 mg | Freq: Four times a day (QID) | ORAL | Status: DC | PRN
  Administered 2015-05-28: 650 mg
  Filled 2015-05-28: qty 20.3

## 2015-05-28 MED ORDER — ALTEPLASE 2 MG IJ SOLR
2.0000 mg | Freq: Once | INTRAMUSCULAR | Status: AC
  Administered 2015-05-28: 2 mg
  Filled 2015-05-28: qty 2

## 2015-05-28 MED ORDER — FREE WATER
30.0000 mL | Freq: Three times a day (TID) | Status: DC
  Administered 2015-05-28 – 2015-05-29 (×4): 30 mL

## 2015-05-28 MED ORDER — FENTANYL CITRATE (PF) 100 MCG/2ML IJ SOLN: 100.0000 ug | INTRAMUSCULAR | Status: DC | PRN

## 2015-05-28 MED ORDER — TRACE MINERALS CR-CU-MN-SE-ZN 10-1000-500-60 MCG/ML IV SOLN
INTRAVENOUS | Status: AC
  Administered 2015-05-28: 18:00:00 via INTRAVENOUS
  Filled 2015-05-28: qty 1992

## 2015-05-28 NOTE — Progress Notes (Signed)
Placed on high fowlers position cuff deflated suctioned orally and endotracheally and then extubated to 2 lpm O2 [place]. No adverse reaction noted

## 2015-05-28 NOTE — Progress Notes (Signed)
PARENTER[facility] NUTRITION CONSULT NOTE   Pharmacy Consult for electrolytes and insulin management Indication: ICU patient requiring mechanical ventilation and TPN  No Known Allergies  Patient Measurements: Height: 5\' 8"  (172.7 cm) Weight: 175 lb 0.7 oz (79.4 kg) IBW/kg (Calculated) : 68.4   Vital Signs: Temp: 100.4 F (38 C) (03/05 0600) Temp Source: Rectal (03/05 0600) BP: 132/76 mmHg (03/05 0600) Pulse Rate: 87 (03/05 0600) Intake/Output from previous day: 03/04 0701 - 03/05 0700 In: 3313.3 [I.V.:366.8; TPN:2946.5] Out: [id number] [Urine:4270; Emesis/NG output:150; Drains:75] Intake/Output from this shift:    Labs:  Recent Labs  05/27/15 1247 05/28/15 0918  WBC 6.8 6.1  HGB 12.0* 11.1*  HCT 36.3* 32.8*  PLT 195 206     Recent Labs  05/26/15 0455 05/27/15 0331 05/28/15 0918  NA 141 141 138  K 4.0 3.7 3.6  CL 106 106 102  CO2 30 32 31  GLUCOSE 145* 151* 179*  BUN 23* 24* 23*  CREATININE 0.88 0.80 0.86  CALCIUM 7.3* 7.4* 7.4*  MG 2.2 2.1 2.2  PHOS 2.9 3.5 3.6   Estimated Creatinine Clearance: 78.4 mL/min (by C-G formula based on Cr of 0.86).    Recent Labs  05/27/15 2336 05/28/15 0353 05/28/15 0807  GLUCAP 147* 160* 147*    Medical History: Past Medical History  Diagnosis Date  . History of kidney cancer   . Bladder cancer ([place])   . Pneumonia     Medications:  Scheduled:  . antiseptic oral rinse  7 mL Mouth Rinse QID  . budesonide (PULMICORT) nebulizer solution  0.5 mg Nebulization BID  . chlorhexidine gluconate  15 mL Mouth Rinse BID  . diazepam  5 mg Intravenous Q6H  . enoxaparin (LOVENOX) injection  40 mg Subcutaneous Q24H  . ertapenem (INVANZ) IV  250 mg Intravenous Once  . ertapenem (INVANZ) IV  1 g Intravenous Once  . ertapenem  1 g Intravenous Q24H  . insulin aspart  2-6 Units Subcutaneous 6 times per day  . ipratropium-albuterol  3 mL Nebulization Q6H  . naloxone      . pantoprazole (PROTONIX) IV  40 mg Intravenous Q24H  . thiamine   100 mg Intravenous Daily   Infusions:  . Marland KitchenTPN (CLINIMIX-E) Adult 83 mL/hr at 05/28/15 0600  . dexmedetomidine 1 mcg/kg/hr (05/28/15 0959)  . fentanyl 2500 mcg in NS 250 mL (10 mcg/mL) infusion 5 mL/hr at 05/28/15 0959    Insulin Requirements in the past 24 hours:  14  Current Nutrition:  TPN 5/20 at 83 mL/hr   Plan:  Will continue patient on SSI Q4hr while patient is on TPN. Electrolytes are WNL. Will f/u AM labs.   Pharmacy will continue to monitor and adjust per consult.    Jamiyla Ishee D, Pharm.D., BCPS Clinical Pharmacist 05/28/2015,10:19 AM

## 2015-05-28 NOTE — Progress Notes (Signed)
Nutrition Follow-up   INTERVENTION:   EN: Received consult from MD to start EN via NG tube. Will start trophic feeds of Vital High Protein at 34mL/hr and monitor tolerance before recommending goal rate secondary to GI function.   PN: Will wean off TPN once pt able to tolerate EN via NGtube. Continue 5%AA/20% Dextrose at 31mL/hr this evening and will reassess rate for weaning tomorrow pending tolerance of trophic feeds. Current rate of TPN providing 95% of caloric needs, 86% of protein needs.    NUTRITION DIAGNOSIS:   Inadequate oral intake related to acute illness, altered GI function as evidenced by NPO status. Being addressed with PN/EN.  GOAL:   Provide needs based on ASPEN/SCCM guidelines  MONITOR:    (PN, Energy Intake, Anthropometrics, Electrolyte/Renal Profile, Glucose Profile)  REASON FOR ASSESSMENT:   Ventilator, Consult New TPN/TNA  ASSESSMENT:    Pt remains intubated. Minimal output via NG tube in past 24 hours.  Diet Order:  .TPN (CLINIMIX-E) Adult .TPN (CLINIMIX-E) Adult    PN: pt tolerating 5%AA/20%Dextrose at 78mL/hr   Gastrointestinal Profile: distended, faint BS, tender abdomen per Nsg documentation Last BM: 05/26/2015  UOP: 4261mL in last 24 hours   Scheduled Medications:  . antiseptic oral rinse  7 mL Mouth Rinse QID  . budesonide (PULMICORT) nebulizer solution  0.5 mg Nebulization BID  . chlorhexidine gluconate  15 mL Mouth Rinse BID  . diazepam  5 mg Intravenous Q6H  . enoxaparin (LOVENOX) injection  40 mg Subcutaneous Q24H  . ertapenem (INVANZ) IV  250 mg Intravenous Once  . ertapenem (INVANZ) IV  1 g Intravenous Once  . ertapenem  1 g Intravenous Q24H  . feeding supplement (VITAL HIGH PROTEIN)  1,000 mL Per Tube Q24H  . free water  30 mL Per Tube 3 times per day  . insulin aspart  2-6 Units Subcutaneous 6 times per day  . ipratropium-albuterol  3 mL Nebulization Q6H  . naloxone      . pantoprazole (PROTONIX) IV  40 mg Intravenous Q24H  .  thiamine  100 mg Intravenous Daily    Continuous Medications:  . Marland KitchenTPN (CLINIMIX-E) Adult 83 mL/hr at 05/28/15 0600  . Marland KitchenTPN (CLINIMIX-E) Adult    . dexmedetomidine 0.7 mcg/kg/hr (05/28/15 1233)  . fentanyl 2500 mcg in NS 250 mL (10 mcg/mL) infusion Stopped (05/28/15 1045)     Electrolyte/Renal Profile and Glucose Profile:   Recent Labs Lab 05/26/15 0455 05/27/15 0331 05/28/15 0918  NA 141 141 138  K 4.0 3.7 3.6  CL 106 106 102  CO2 30 32 31  BUN 23* 24* 23*  CREATININE 0.88 0.80 0.86  CALCIUM 7.3* 7.4* 7.4*  MG 2.2 2.1 2.2  PHOS 2.9 3.5 3.6  GLUCOSE 145* 151* 179*   Protein Profile:  Recent Labs Lab 05/22/15 0522 05/23/15 0614  ALBUMIN 1.9* 1.8*     Weight Trend since Admission: Filed Weights   05/26/15 0500 05/27/15 0700 05/28/15 0500  Weight: 180 lb 5.4 oz (81.8 kg) 178 lb 12.7 oz (81.1 kg) 175 lb 0.7 oz (79.4 kg)     BMI:  Body mass index is 26.62 kg/(m^2).  Estimated Nutritional Needs:   Kcal:  1852 kcals (Ve: 6.5,. Tmax: 38.3) using wt of 79.2 kg  Protein:  116-154 g(1.5-2.0 g/kg)   Fluid:  1925-2310 mL (25-30 ml/kg)    HIGH Care Level  Dwyane Luo, RD, LDN Pager [phone] Weekend/On-Call Pager [phone]

## 2015-05-28 NOTE — Progress Notes (Signed)
Patient ID: Brady Smith, male   DOB: 06-23-1945, 70 y.o.   MRN: [id number] Pt remains sedated, on vent. VSS. Good u/o. Labs are stable. Abdomen is mildly distended but soft. Incision looks clean and intact. Drainage has been minimal. One drain removed. May consider enteral feeding via ngt.

## 2015-05-28 NOTE — Progress Notes (Addendum)
Patient ID: Brady Smith, male   DOB: 01/31/1946, 70 y.o.   MRN: [id number] [facility] PROGRESS NOTE  ARIAS UPLINGER [id number] DOB: [birth year] DOA: 05/17/2015 PCP: Halina Maidens, MD  HPI/Subjective: Patient is lethargic. Unable to get much response from him today.  Objective: Filed Vitals:   05/28/15 0900 05/28/15 1000  BP: 96/76 102/69  Pulse: 69 71  Temp: 99.5 F (37.5 C) 99.5 F (37.5 C)  Resp: 19 17    Filed Weights   05/26/15 0500 05/27/15 0700 05/28/15 0500  Weight: 81.8 kg (180 lb 5.4 oz) 81.1 kg (178 lb 12.7 oz) 79.4 kg (175 lb 0.7 oz)    ROS: Review of Systems  Unable to perform ROS  unable to obtain secondary to patient being on ventilator  Exam: Physical Exam  HENT:  Nose: No mucosal edema.  Unable to look in the mouth today  Eyes: Conjunctivae and lids are normal. Pupils are equal, round, and reactive to light.  Neck: No JVD present. Carotid bruit is not present. No edema present. No thyroid mass and no thyromegaly present.  Cardiovascular: S1 normal and S2 normal.  Exam reveals no gallop.   No murmur heard. Pulses:      Dorsalis pedis pulses are 0 on the right side, and 1+ on the left side.  Respiratory: No respiratory distress. He has wheezes in the right lower field and the left lower field. He has no rhonchi. He has no rales.  GI: Soft. He exhibits distension. Bowel sounds are decreased. There is generalized tenderness.  Musculoskeletal:       Right ankle: He exhibits no swelling.       Left ankle: He exhibits no swelling.  Lymphadenopathy:    He has no cervical adenopathy.  Neurological: He is alert.  Able to lift up arms up off the bed  Skin: Skin is warm. Nails show no clubbing.  Right foot cool to touch  Psychiatric: He has a normal mood and affect.      Data Reviewed: Basic Metabolic Panel:  Recent Labs Lab 05/24/15 0455 05/25/15 0411 05/26/15 0455 05/27/15 0331 05/28/15 0918  NA 146* 142 141 141 138  K 4.0 3.4*  4.0 3.7 3.6  CL 111 106 106 106 102  CO2 32 32 30 32 31  GLUCOSE 145* 167* 145* 151* 179*  BUN 29* 22* 23* 24* 23*  CREATININE 0.96 0.75 0.88 0.80 0.86  CALCIUM 7.7* 7.3* 7.3* 7.4* 7.4*  MG 2.5* 2.2 2.2 2.1 2.2  PHOS 3.8 2.4* 2.9 3.5 3.6   Liver Function Tests:  Recent Labs Lab 05/22/15 0522 05/23/15 0614  AST 64* 47*  ALT 41 55  ALKPHOS 28* 37*  BILITOT 0.8 0.9  PROT 5.2* 4.9*  ALBUMIN 1.9* 1.8*   CBC:  Recent Labs Lab 05/23/15 0614 05/24/15 0455 05/25/15 0411 05/27/15 1247 05/28/15 0918  WBC 6.8 5.9 6.4 6.8 6.1  NEUTROABS  --   --   --  6.0  --   HGB 12.7* 12.5* 12.0* 12.0* 11.1*  HCT 38.5* 37.8* 35.5* 36.3* 32.8*  MCV 97.0 98.3 94.4 97.6 94.0  PLT 104* 96* 110* 195 206    CBG:  Recent Labs Lab 05/27/15 2304 05/27/15 2336 05/28/15 0353 05/28/15 0807 05/28/15 1116  GLUCAP 154* 147* 160* 147* 178*    Recent Results (from the past 240 hour(s))  MRSA PCR Screening     Status: None   Collection Time: 05/20/15  5:08 PM  Result Value Ref Range Status  MRSA by PCR NEGATIVE NEGATIVE Final    Comment:        The GeneXpert MRSA Assay (FDA approved for NASAL specimens only), is one component of a comprehensive MRSA colonization surveillance program. It is not intended to diagnose MRSA infection nor to guide or monitor treatment for MRSA infections.   Culture, respiratory (NON-Expectorated)     Status: None   Collection Time: 05/22/15 10:29 AM  Result Value Ref Range Status   Specimen Description TRACHEAL ASPIRATE  Final   Special Requests NONE  Final   Gram Stain   Final    FEW WBC SEEN RARE SQUAMOUS EPITHELIAL CELLS PRESENT NO ORGANISMS SEEN    Culture LIGHT GROWTH CITROBACTER BRAAKII  Final   Report Status 05/25/2015 FINAL  Final   Organism ID, Bacteria CITROBACTER BRAAKII  Final      Susceptibility   Citrobacter braakii - MIC*    CEFAZOLIN >=64 RESISTANT Resistant     CEFEPIME <=1 SENSITIVE Sensitive     CEFTAZIDIME <=1 SENSITIVE  Sensitive     CEFTRIAXONE <=1 SENSITIVE Sensitive     CIPROFLOXACIN <=0.25 SENSITIVE Sensitive     GENTAMICIN <=1 SENSITIVE Sensitive     IMIPENEM 0.5 SENSITIVE Sensitive     TRIMETH/SULFA <=20 SENSITIVE Sensitive     PIP/TAZO <=4 SENSITIVE Sensitive     * LIGHT GROWTH CITROBACTER BRAAKII  CULTURE, BLOOD (ROUTINE X 2) w Reflex to PCR ID Panel     Status: None   Collection Time: 05/22/15 10:37 AM  Result Value Ref Range Status   Specimen Description BLOOD LT [facility]  Final   Special Requests BOTTLES DRAWN AEROBIC AND ANAEROBIC 7ML ANA 7MLAER  Final   Culture NO GROWTH 5 DAYS  Final   Report Status 05/27/2015 FINAL  Final  CULTURE, BLOOD (ROUTINE X 2) w Reflex to PCR ID Panel     Status: None   Collection Time: 05/22/15 10:50 AM  Result Value Ref Range Status   Specimen Description BLOOD RT AC  Final   Special Requests   Final    BOTTLES DRAWN AEROBIC AND ANAEROBIC 7ML ANA 7ML AER   Culture NO GROWTH 5 DAYS  Final   Report Status 05/27/2015 FINAL  Final  Urine culture     Status: None   Collection Time: 05/22/15 11:47 AM  Result Value Ref Range Status   Specimen Description URINE, RANDOM  Final   Special Requests NONE  Final   Culture NO GROWTH 1 DAY  Final   Report Status 05/23/2015 FINAL  Final  CULTURE, BLOOD (ROUTINE X 2) w Reflex to PCR ID Panel     Status: None (Preliminary result)   Collection Time: 05/27/15  1:26 PM  Result Value Ref Range Status   Specimen Description BLOOD RIGHT HAND  Final   Special Requests BOTTLES DRAWN AEROBIC AND ANAEROBIC 3CC  Final   Culture NO GROWTH < 24 HOURS  Final   Report Status PENDING  Incomplete  CULTURE, BLOOD (ROUTINE X 2) w Reflex to PCR ID Panel     Status: None (Preliminary result)   Collection Time: 05/27/15  1:33 PM  Result Value Ref Range Status   Specimen Description BLOOD LEFT HAND  Final   Special Requests BOTTLES DRAWN AEROBIC AND ANAEROBIC 5CC  Final   Culture NO GROWTH < 24 HOURS  Final   Report Status PENDING  Incomplete      Scheduled Meds: . antiseptic oral rinse  7 mL Mouth Rinse QID  . budesonide (PULMICORT)  nebulizer solution  0.5 mg Nebulization BID  . chlorhexidine gluconate  15 mL Mouth Rinse BID  . diazepam  2.5 mg Intravenous Q6H  . enoxaparin (LOVENOX) injection  40 mg Subcutaneous Q24H  . feeding supplement (VITAL HIGH PROTEIN)  1,000 mL Per Tube Q24H  . free water  30 mL Per Tube 3 times per day  . insulin aspart  2-6 Units Subcutaneous 6 times per day  . ipratropium-albuterol  3 mL Nebulization Q6H  . naloxone      . pantoprazole (PROTONIX) IV  40 mg Intravenous Q24H  . thiamine  100 mg Intravenous Daily   Continuous Infusions: . Marland KitchenTPN (CLINIMIX-E) Adult 83 mL/hr at 05/28/15 0600  . Marland KitchenTPN (CLINIMIX-E) Adult    . dexmedetomidine Stopped (05/28/15 1400)  . fentanyl 2500 mcg in NS 250 mL (10 mcg/mL) infusion Stopped (05/28/15 1045)    Assessment/Plan:  1. Persistent fever. CT scan of the abdomen and pelvis yesterday unremarkable for source of fever. Chest x-ray negative. Blood cultures so far negative to date. I will get rid of the Invanz just in case this is drug fever. I will put on IV Cipro to cover the Citrobacter growing in the sputum culture. 2. Incarcerated ventral hernia. Status post removal of necrotic bowel and incarcerated hernia repair. Patient currently on TPN 3. Alcohol withdrawal. Patient should be through withdrawal process by now. Decrease the standing dose IV Valium to 2.5 mg IV twice a day 4. Acute respiratory failure with hypoxia. Patient was reintubated 05/24/2015 secondary to encephalopathy, ileus and possible aspiration pneumonia. Sputum culture positive for Citrobacter on ertapenem. Patient currently on 30% FiO2. Pulmonary currently doing weaning process. Mental status is still impaired at this point. 5. Atrial fibrillation. Rate controlled. Hold anticoagulation at this point. 6. History of COPD on  Pulmicort nebulizers 7. Hypernatremia improved with D5W 8. Decreased  pulse right lower extremity. Vascular surgery consultation placed.  Code Status:     Code Status Orders        Start     Ordered   05/18/15 0352  Full code   Continuous     05/18/15 0351    Code Status History    Date Active Date Inactive Code Status Order ID Comments User Context   This patient has a current code status but no historical code status.    Advance Directive Documentation        Most Recent Value   Type of Advance Directive  Healthcare Power of Attorney   Pre-existing out of facility DNR order (yellow form or pink MOST form)     "MOST" Form in Place?       Disposition Plan: To be determined  Consultants:  Critical care specialist  Surgery  Antibiotics:  Change antibiotics to Cipro  Time spent: 32 minutes critical care time  Loletha Grayer  [facility] Hospitalists

## 2015-05-28 NOTE — Progress Notes (Addendum)
PULMONARY / CRITICAL CARE MEDICINE   Name: Brady Smith MRN: [id number] DOB: 09/01/1945    ADMISSION DATE:  05/17/2015 CONSULTATION DATE:  02/26  REFERRING MD:  Adonis Huguenin  History of present illness: 70 M with history of heavy EtOH and heavy smoking history admitted early 02/23 with incarcerated ventral hernia and SBO. Underwent laparotomy 02/25 and was noted to have a difficult airway. Prior to surgery, there was concern for alcohol withdraw. Dr Adonis Huguenin desires that he remain on vent at least through 02/26 due to concern about stability of surgical wound.  Failure to wean from vent due to delerium  Subjective  No acute issues overnight. More awake and following basic commands. Low grade fever this morning. MAPs still in the low 60s  VITAL SIGNS: BP 111/62 mmHg  Pulse 78  Temp(Src) 100.4 F (38 C) (Rectal)  Resp 22  Ht 5\' 8"  (1.727 m)  Wt 175 lb 0.7 oz (79.4 kg)  BMI 26.62 kg/m2  SpO2 95%  HEMODYNAMICS:    VENTILATOR SETTINGS: Vent Mode:  [-] PSV;CPAP FiO2 (%):  [35 %] 35 % Set Rate:  [15 bmp] 15 bmp Vt Set:  [500 mL] 500 mL PEEP:  [5 cmH20] 5 cmH20 Pressure Support:  [5 cmH20] 5 cmH20 Plateau Pressure:  [18 cmH20] 18 cmH20  INTAKE / OUTPUT: I/O last 3 completed shifts: In: 7409.9 [I.V.:508.8; Other:225] Out: [id number]; Emesis/NG output:150; Drains:75]  PHYSICAL EXAMINATION: General: NAD, awake on the vent Neuro: Moves all extremities on command, follows basic commands HEENT: NCAT, PERRLA, oral mucosa moist Cardiovascular: RRR, S1/S2, no MRG Lungs: Bilateral airflow, mild expiratory wheezes, coarse rhonchi, diminished breath sounds bilaterally Abdomen:Non-distended; hypoactive bowel sounds, dressing and left JP drain in place with small serous-sanguinous drainage Ext: warm, no edema  LABS:  BMET  Recent Labs Lab 05/26/15 0455 05/27/15 0331 05/28/15 0918  NA 141 141 138  K 4.0 3.7 3.6  CL 106 106 102  CO2 30 32 31  BUN 23* 24* 23*  CREATININE  0.88 0.80 0.86  GLUCOSE 145* 151* 179*    Electrolytes  Recent Labs Lab 05/26/15 0455 05/27/15 0331 05/28/15 0918  CALCIUM 7.3* 7.4* 7.4*  MG 2.2 2.1 2.2  PHOS 2.9 3.5 3.6    CBC  Recent Labs Lab 05/25/15 0411 05/27/15 1247 05/28/15 0918  WBC 6.4 6.8 6.1  HGB 12.0* 12.0* 11.1*  HCT 35.5* 36.3* 32.8*  PLT 110* 195 206    Coag's No results for input(s): APTT, INR in the last 168 hours.  Sepsis Markers No results for input(s): LATICACIDVEN, PROCALCITON, O2SATVEN in the last 168 hours.  ABG  Recent Labs Lab 05/24/15 1144 05/25/15 1544 05/28/15 1222  PHART 7.41 7.46* 7.44  PCO2ART 48 44 47  PO2ART 69* 84 78*    Liver Enzymes  Recent Labs Lab 05/22/15 0522 05/23/15 0614  AST 64* 47*  ALT 41 55  ALKPHOS 28* 37*  BILITOT 0.8 0.9  ALBUMIN 1.9* 1.8*    Cardiac Enzymes No results for input(s): TROPONINI, PROBNP in the last 168 hours.  Glucose  Recent Labs Lab 05/27/15 2304 05/27/15 2336 05/28/15 0353 05/28/15 0807 05/28/15 1116 05/28/15 1539  GLUCAP 154* 147* 160* 147* 178* 157*    Ct Abdomen Pelvis W Contrast  05/27/2015  CLINICAL DATA:  One week postop from bowel resection for bowel obstruction. Fever. Bladder carcinoma. EXAM: CT ABDOMEN AND PELVIS WITH CONTRAST TECHNIQUE: Multidetector CT imaging of the abdomen and pelvis was performed using the standard protocol following bolus administration of intravenous contrast.  CONTRAST:  178mL OMNIPAQUE IOHEXOL 350 MG/ML SOLN, 1 OMNIPAQUE IOHEXOL 240 MG/ML SOLN COMPARISON:  05/17/2015 FINDINGS: Lower chest: New small bilateral pleural effusions and bibasilar atelectasis demonstrated. Hepatobiliary: No masses or other significant abnormality. Prior cholecystectomy noted. No evidence of biliary dilatation. Pancreas: No mass, inflammatory changes, or other significant abnormality. Spleen: Within normal limits in size and appearance. Adrenals/Urinary Tract: No evidence of adrenal or renal masses. Tiny sub-cm  right renal cyst remains stable. No evidence of hydronephrosis. Patient has undergone cystectomy with ileal loop diversion into pelvic neobladder. A Foley catheter is seen within the neobladder which is decompressed. Stomach/Bowel: Recent postsurgical changes are seen from repair of large ventral abdominal wall hernia. Mild wall thickening of the transverse colon is seen near the site of the hernia repair likely due to postop edema. No evidence of obstruction. There is no evidence of recurrent hernia. No evidence of abscess or free air. Vascular/Lymphatic: No pathologically enlarged lymph nodes. 3.0 cm infrarenal abdominal aortic aneurysm remains stable. No evidence of aneurysm leak or rupture. Reproductive: No mass or other significant abnormality. Other: None. Musculoskeletal:  No suspicious bone lesions identified. IMPRESSION: Expected postoperative changes from repair recent ventral abdominal wall hernia. Low residual transverse colonic wall thickening near the hernia site is likely due to postop edema. No evidence of bowel obstruction or abscess. New small bilateral pleural effusions and bibasilar atelectasis. Stable postop changes from previous cystectomy and ileal loop diversion into pelvic neobladder. No evidence of hydronephrosis. No evidence of recurrent or metastatic carcinoma. Stable 3.0 cm infrarenal abdominal aortic aneurysm. Recommend followup by ultrasound in 3 years. This recommendation follows ACR consensus guidelines: White Paper of the ACR Incidental Findings Committee II on Vascular Findings. Natasha [employer] Radiol 2013; 10:789-794 Electronically Signed   By: Earle Gell M.D.   On: 05/27/2015 16:07   Dg Chest Port 1 View  05/28/2015  CLINICAL DATA:  Acute respiratory failure, history bladder cancer, renal cancer, pneumonia, smoking EXAM: PORTABLE CHEST 1 VIEW COMPARISON:  Portable exam 0608 hours compared to 05/25/2015 FINDINGS: Tip of endotracheal tube projects 1.8 cm above carina. Nasogastric tube  coiled in proximal stomach. RIGHT arm PICC line tip projects over SVC. Upper normal heart size. Stable mediastinal contours and pulmonary vascularity. Perihilar to basilar infiltrates again identified question mild pulmonary edema. Bibasilar atelectasis. Cannot completely exclude small RIGHT pleural effusion. No pneumothorax. IMPRESSION: Question mild pulmonary edema with coexistent bibasilar atelectasis. Electronically Signed   By: Lavonia Dana M.D.   On: 05/28/2015 08:06   MAJOR EVENTS/TEST RESULTS: VENT DAY #6 02/23 Admission 02/23 IM consult 02/25 Laparotomy, strangulated small bowel, small bowel resection 03/01 EXTUBATED 03/02 RE-INTUBATED 03/05:Extubated  INDWELLING DEVICES:: ETT 02/25 >> 03/05  MICRO DATA: Urine 02/23 >> NEG MRSA PCR 02/25 >> NEG REsp culture-CITROBACTER SPECIES  ANTIMICROBIALS:  Ertapenem 02/25 >>03/05 Cipro for citrobacter PNA 03/05>  Discussion 70 yo white male with acute s/p laparotomy for acute small bowel infarction complicated by acute resp failure and metabolic encephalopathy and acute etoh withdrawal requiring vent support with CITROBACTER PNEUMONIA; now extubated  ASSESSMENT / PLAN: PULMONARY A: Post op vent status  Heavy smoker without prior dx of COPD, no overt airflow obstruction P:   -Extubated -Supplemental O2 to keep SaO2 >90% -Cont nebulized steroids and bronchodilators -Patient is at increased risk for re-intubation; monitor closely  CARDIOVASCULAR A:  Transient post op hypotension - improved post-extubation P:  -MAP goal > 65 mmHg -Hemodynamic monitoring per ICU protocol  RENAL A:   No acute issues P:  Monitor BMET intermittently Monitor I/Os Correct electrolytes as indicated  GASTROINTESTINAL A:   Acute small bowel infarction S/P laparotomy P:   -SUP: IV PPI -Continue TPN -NGT to suction  HEMATOLOGIC A:   Very mild thrombocytopenia-resolved P:  DVT px: LMWH Monitor CBC intermittently Transfuse per usual ICU  guidelines  INFECTIOUS A:   Abdominal infection s/p laparotomy for small bowel infarction P:   -Monitor temp, WBC count -Micro and abx as above  ENDOCRINE A:   Mild hyperglycemia without prior dx of DM P:   -Blood glucose monitoring every 4 hours with sliding scale insulin coverage per protocol   NEUROLOGIC A:   Heavy ETOH use Post op pain P:   RASS goal: n/a Cont low dose Precedex infusion for agitation PRN fentanyl for pain Continue scheduled IV valium   FAMILY  No family @ bedside  Best Practice: Code Status:  Full. Diet: NPO/TPN GI prophylaxis:  PPI. VTE prophylaxis:  SCD's / Lovenox.  Magdalene S. Tukov ANP-BC Pulmonary and [place] Pager [phone]    05/28/2015, 4:01 PM   The patient was seen and examined with the nurse practitioner, I reviewed her assessment and plan and agree with the assessment and plan as stated above.  Marda Stalker M.D.  Critical Care Attestation.  I have personally obtained a history, examined the patient, evaluated laboratory and imaging results, formulated the assessment and plan and placed orders. The Patient requires high complexity decision making for assessment and support, frequent evaluation and titration of therapies, application of advanced monitoring technologies and extensive interpretation of multiple databases. The patient has critical illness that could lead imminently to failure of 1 or more organ systems and requires the highest level of physician preparedness to intervene.  Critical Care Time devoted to patient care services described in this note is 35 minutes and is exclusive of time spent in procedures.

## 2015-05-29 LAB — GLUCOSE, CAPILLARY
GLUCOSE-CAPILLARY: 205 mg/dL — AB (ref 65–99)
GLUCOSE-CAPILLARY: 85 mg/dL (ref 65–99)
GLUCOSE-CAPILLARY: 41 mg/dL — AB (ref 65–99)
Glucose-Capillary: 122 mg/dL — ABNORMAL HIGH (ref 65–99)
Glucose-Capillary: 158 mg/dL — ABNORMAL HIGH (ref 65–99)
Glucose-Capillary: 164 mg/dL — ABNORMAL HIGH (ref 65–99)
Glucose-Capillary: 192 mg/dL — ABNORMAL HIGH (ref 65–99)
Glucose-Capillary: 42 mg/dL — CL (ref 65–99)

## 2015-05-29 LAB — PHOSPHORUS: PHOSPHORUS: 3.1 mg/dL (ref 2.5–4.6)

## 2015-05-29 LAB — BASIC METABOLIC PANEL
ANION GAP: 6 (ref 5–15)
BUN: 20 mg/dL (ref 6–20)
CHLORIDE: 103 mmol/L (ref 101–111)
CO2: 31 mmol/L (ref 22–32)
Calcium: 7.7 mg/dL — ABNORMAL LOW (ref 8.9–10.3)
Creatinine, Ser: 0.82 mg/dL (ref 0.61–1.24)
GFR calc non Af Amer: >60 mL/min (ref >60)
Glucose, Bld: 192 mg/dL — ABNORMAL HIGH (ref 65–99)
POTASSIUM: 3.4 mmol/L — AB (ref 3.5–5.1)
Sodium: 140 mmol/L (ref 135–145)

## 2015-05-29 LAB — RAPID HIV SCREEN (HIV 1/2 AB+AG)
HIV 1/2 ANTIBODIES: NONREACTIVE
HIV-1 P24 Antigen - HIV24: NONREACTIVE

## 2015-05-29 LAB — MAGNESIUM: Magnesium: 2.1 mg/dL (ref 1.7–2.4)

## 2015-05-29 MED ORDER — TRACE MINERALS CR-CU-MN-SE-ZN 10-1000-500-60 MCG/ML IV SOLN
INTRAVENOUS | Status: DC
  Filled 2015-05-29: qty 960

## 2015-05-29 MED ORDER — CETYLPYRIDINIUM CHLORIDE 0.05 % MT LIQD
7.0000 mL | Freq: Two times a day (BID) | OROMUCOSAL | Status: DC
  Administered 2015-05-29 – 2015-06-06 (×17): 7 mL via OROMUCOSAL

## 2015-05-29 MED ORDER — DEXTROSE 50 % IV SOLN
25.0000 mL | Freq: Once | INTRAVENOUS | Status: AC
  Administered 2015-05-29: 25 mL via INTRAVENOUS

## 2015-05-29 MED ORDER — FREE WATER
200.0000 mL | Freq: Three times a day (TID) | Status: DC
  Administered 2015-05-29 – 2015-05-31 (×5): 200 mL

## 2015-05-29 MED ORDER — CHLORHEXIDINE GLUCONATE 0.12 % MT SOLN
15.0000 mL | Freq: Two times a day (BID) | OROMUCOSAL | Status: DC
  Administered 2015-05-29 – 2015-06-08 (×19): 15 mL via OROMUCOSAL
  Filled 2015-05-29 (×14): qty 15

## 2015-05-29 MED ORDER — VITAL 1.5 CAL PO LIQD: 1000.0000 mL | ORAL | Status: DC

## 2015-05-29 MED ORDER — ALTEPLASE 2 MG IJ SOLR
2.0000 mg | Freq: Once | INTRAMUSCULAR | Status: AC
  Administered 2015-05-29: 2 mg
  Filled 2015-05-29: qty 2

## 2015-05-29 MED ORDER — DIAZEPAM 5 MG/ML IJ SOLN: 2.5000 mg | Freq: Every day | INTRAMUSCULAR | Status: DC

## 2015-05-29 MED ORDER — DEXTROSE 50 % IV SOLN
INTRAVENOUS | Status: AC
  Administered 2015-05-29: 25 mL via INTRAVENOUS
  Filled 2015-05-29: qty 50

## 2015-05-29 MED ORDER — POTASSIUM CHLORIDE 10 MEQ/100ML IV SOLN
10.0000 meq | INTRAVENOUS | Status: AC
Start: 2015-05-29 — End: 2015-05-29
  Administered 2015-05-29 (×2): 10 meq via INTRAVENOUS
  Filled 2015-05-29 (×2): qty 100

## 2015-05-29 NOTE — Consult Note (Signed)
Brady Smith Consult Note  MRN : [id number]  Brady Smith is a 70 y.o. (1945-03-28) male who presents with chief complaint of  Chief Complaint  Patient presents with  . Chest Pain  . Abdominal Pain  .  History of Present Illness: I am asked by Dr. Leslye Peer to see the patient due to non-palpable right pedal pulses.  He had a bowel resection over a week ago for an incarcerated ventral hernia with necrotic bowel. He had a prolonged postoperative course secondary to alcohol withdrawals and has just come off the ventilator this past weekend. He remains very lethargic and is unable to provide any history and all of the history is obtained from the previous medical record. It was noted that his right foot did not have palpable pedal pulses and was slightly cooler than the left. He does not have any open ulcerations, infection, gangrenous changes, or nonviable tissue of the right foot that I can see. He is unable to provide any history in terms of whether or not he has claudication or describes any pain. The nurses have used the appropriate padding devices on the right heel to avoid any pressure ulcerations. He does have a palpable pulse in the left foot although it is not prominent. He has multiple atherosclerotic risk factors including ongoing heavy tobacco use. I do not see any reported history of previous Smith surgery interventions or evaluations.  Current Facility-Administered Medications  Medication Dose Route Frequency Provider Last Rate Last Dose  . Marland KitchenTPN (CLINIMIX-E) Adult   Intravenous Continuous TPN Seeplaputhur Robinette Haines, MD 83 mL/hr at 05/29/15 0700    . acetaminophen (TYLENOL) solution 650 mg  650 mg Per Tube Q6H PRN Loletha Grayer, MD   650 mg at 05/28/15 1645  . acetaminophen (TYLENOL) suppository 650 mg  650 mg Rectal Q4H PRN Jules Husbands, MD   650 mg at 05/28/15 2332  . albuterol (PROVENTIL) (2.5 MG/3ML) 0.083% nebulizer solution 2.5 mg  2.5 mg  Nebulization Q3H PRN Wilhelmina Mcardle, MD      . antiseptic oral rinse solution (CORINZ)  7 mL Mouth Rinse QID Clayburn Pert, MD   7 mL at 05/29/15 0340  . budesonide (PULMICORT) nebulizer solution 0.5 mg  0.5 mg Nebulization BID Flora Lipps, MD   0.5 mg at 05/29/15 0802  . ciprofloxacin (CIPRO) IVPB 400 mg  400 mg Intravenous Q12H Loletha Grayer, MD   400 mg at 05/29/15 0338  . dexmedetomidine (PRECEDEX) 400 MCG/100ML (4 mcg/mL) infusion  0.4-1.2 mcg/kg/hr Intravenous Titrated Flora Lipps, MD   Stopped at 05/28/15 1400  . diazepam (VALIUM) injection 2.5 mg  2.5 mg Intravenous Q12H Loletha Grayer, MD   2.5 mg at 05/28/15 2139  . enoxaparin (LOVENOX) injection 40 mg  40 mg Subcutaneous Q24H Wilhelmina Mcardle, MD   40 mg at 05/28/15 0914  . feeding supplement (VITAL HIGH PROTEIN) liquid 1,000 mL  1,000 mL Per Tube Q24H Seeplaputhur Robinette Haines, MD      . fentaNYL (SUBLIMAZE) injection 100 mcg  100 mcg Intravenous Q2H PRN Mikael Spray, NP      . free water 30 mL  30 mL Per Tube 3 times per day Christene Lye, MD   30 mL at 05/29/15 0600  . hydrALAZINE (APRESOLINE) injection 10-40 mg  10-40 mg Intravenous Q4H PRN Wilhelmina Mcardle, MD      . insulin aspart (novoLOG) injection 2-6 Units  2-6 Units Subcutaneous 6 times per day Audrie Lia  Simpson, [facility]   4 Units at 05/29/15 0854  . ipratropium-albuterol (DUONEB) 0.5-2.5 (3) MG/3ML nebulizer solution 3 mL  3 mL Nebulization Q6H Wilhelmina Mcardle, MD   3 mL at 05/29/15 0800  . LORazepam (ATIVAN) injection 0.5-1 mg  0.5-1 mg Intravenous Q1H PRN Wilhelmina Mcardle, MD   1 mg at 05/29/15 0151  . metoprolol (LOPRESSOR) injection 2.5-5 mg  2.5-5 mg Intravenous Q3H PRN Wilhelmina Mcardle, MD   5 mg at 05/23/15 1720  . ondansetron (ZOFRAN-ODT) disintegrating tablet 4 mg  4 mg Oral Q6H PRN Clayburn Pert, MD       Or  . ondansetron [facility]) injection 4 mg  4 mg Intravenous Q6H PRN Clayburn Pert, MD      . pantoprazole (PROTONIX) injection 40 mg  40 mg Intravenous  Q24H Flora Lipps, MD   40 mg at 05/28/15 1827  . potassium chloride 10 mEq in 100 mL IVPB  10 mEq Intravenous Q1 Hr x 2 Clayburn Pert, MD      . thiamine (B-1) injection 100 mg  100 mg Intravenous Daily Clayburn Pert, MD   100 mg at 05/28/15 1106    Past Medical History  Diagnosis Date  . History of kidney cancer   . Bladder cancer ([place])   . Pneumonia     Past Surgical History  Procedure Laterality Date  . Hernia repair    . Cholecystectomy    . Cystectomy w/ continent diversion  1996  . Prostatectomy  1996  . Tonsillectomy    . Colonoscopy  2000  . Laparotomy N/A 05/20/2015    Procedure: EXPLORATORY LAPAROTOMY;  Surgeon: Clayburn Pert, MD;  Location: [facility] ORS;  Service: General;  Laterality: N/A;  . Bowel resection  05/20/2015    Procedure: SMALL BOWEL RESECTION;  Surgeon: Clayburn Pert, MD;  Location: [facility] ORS;  Service: General;;    Social History Social History  Substance Use Topics  . Smoking status: Current Every Day Smoker -- 2.00 packs/day for 57 years    Types: Cigarettes  . Smokeless tobacco: None     Comment: patient not ready to quit  . Alcohol Use: 8.4 oz/week    14 Standard drinks or equivalent per week    Family History Family History  Problem Relation Age of Onset  . COPD Sister   . Heart disease Mother   . Heart disease Father   no history of clotting disorders or bleeding disorders  No Known Allergies   REVIEW OF SYSTEMS (Negative unless checked)  Constitutional: [] Weight loss  [] Fever  [] Chills Cardiac: [] Chest pain   [] Chest pressure   [] Palpitations   [] Shortness of breath when laying flat   [] Shortness of breath at rest   [] Shortness of breath with exertion. Smith:  [] Pain in legs with walking   [] Pain in legs at rest   [] Pain in legs when laying flat   [] Claudication   [] Pain in feet when walking  [] Pain in feet at rest  [] Pain in feet when laying flat   [] History of DVT   [] Phlebitis   [] Swelling in legs   [] Varicose veins    [] Non-healing ulcers Pulmonary:   [] Uses home oxygen   [] Productive cough   [] Hemoptysis   [x] Wheeze  [x] COPD   [] Asthma Neurologic:  [] Dizziness  [] Blackouts   [] Seizures   [] History of stroke   [] History of TIA  [] Aphasia   [] Temporary blindness   [] Dysphagia   [] Weakness or numbness in arms   [] Weakness or numbness in legs  Musculoskeletal:  [] Arthritis   [] Joint swelling   [] Joint pain   [] Low back pain Hematologic:  [] Easy bruising  [] Easy bleeding   [] Hypercoagulable state   [] Anemic  [] Hepatitis Gastrointestinal:  [x] Blood in stool   [] Vomiting blood  [] Gastroesophageal reflux/heartburn   [] Difficulty swallowing. Genitourinary:  [] Chronic kidney disease   [] Difficult urination  [] Frequent urination  [] Burning with urination   [] Blood in urine Skin:  [] Rashes   [] Ulcers   [] Wounds Psychological:  [] History of anxiety   []  History of major depression.  Physical Examination  Filed Vitals:   05/29/15 0500 05/29/15 0600 05/29/15 0700 05/29/15 0804  BP: 140/81 151/70 158/73   Pulse: 93 93 95   Temp: 99.9 F (37.7 C) 99.9 F (37.7 C) 99.9 F (37.7 C)   TempSrc: Rectal Rectal Rectal   Resp: 21 28 31    Height:      Weight:      SpO2: 96% 97% 95% 96%   Body mass index is 26.02 kg/(m^2). Gen:  WD/WN, appears older than stated age. Head: Thorp/AT, No temporalis wasting. Prominent temp pulse not noted. Ear/Nose/Throat: Hearing grossly intact, nares w/o erythema or drainage, oropharynx w/o Erythema/Exudate Eyes: PERRLA, EOMI.  Neck: Supple, no nuchal rigidity.  No JVD.  Pulmonary:  Good air movement, coarse breath sounds present bilaterally with rhonchi in the bases Cardiac: RRR, normal S1, S2, no Murmurs, rubs or gallops. Smith:  Vessel Right Left  Radial Palpable Palpable  Ulnar Palpable Palpable  Brachial Palpable Palpable  Carotid Palpable, without bruit Palpable, without bruit  Aorta Not palpable N/A  Femoral Palpable Palpable  Popliteal  not Palpable  not Palpable  PT  not  Palpable  not Palpable  DP  not Palpable Palpable   Gastrointestinal: soft, non-tender/non-distended. No guarding/reflex. No masses, surgical incisions, or scars. Musculoskeletal: M/S 5/5 throughout.  No open wounds or gangrenous changes of the lower extremities. No deformity or atrophy. No edema. Neurologic: Difficult to assess due to his poor mental status but seems to move all 4 extremities without focal deficit. Not really following commands Psychiatric: Patient is lethargic and unable to answer questions due to poor mental status. Dermatologic: No rashes or ulcers noted.  No cellulitis or open wounds. Lymph : No Cervical, Axillary, or Inguinal lymphadenopathy.      CBC Lab Results  Component Value Date   WBC 6.1 05/28/2015   HGB 11.1* 05/28/2015   HCT 32.8* 05/28/2015   MCV 94.0 05/28/2015   PLT 206 05/28/2015    BMET    Component Value Date/Time   NA 140 05/29/2015 0822   NA 141 07/28/2013 0444   K 3.4* 05/29/2015 0822   K 4.6 07/28/2013 0444   CL 103 05/29/2015 0822   CL 111* 07/28/2013 0444   CO2 31 05/29/2015 0822   CO2 26 07/28/2013 0444   GLUCOSE 192* 05/29/2015 0822   GLUCOSE 86 07/28/2013 0444   BUN 20 05/29/2015 0822   BUN 6* 07/28/2013 0444   CREATININE 0.82 05/29/2015 0822   CREATININE 1.13 07/28/2013 0444   CALCIUM 7.7* 05/29/2015 0822   CALCIUM 8.7 07/28/2013 0444   GFRNONAA >60 05/29/2015 0822   GFRNONAA >60 07/28/2013 0444   GFRNONAA >60 04/13/2011 0325   GFRAA >60 05/29/2015 0822   GFRAA >60 07/28/2013 0444   GFRAA >60 04/13/2011 0325   Estimated Creatinine Clearance: 82.3 mL/min (by C-G formula based on Cr of 0.82).  COAG Lab Results  Component Value Date   INR 1.13 05/18/2015   INR 1.01 05/17/2015  Radiology Dg Chest 1 View  05/22/2015  CLINICAL DATA:  Line placement. EXAM: CHEST 1 VIEW COMPARISON:  Earlier today. FINDINGS: 1742 hours. Endotracheal tube terminates 4.7 cm above carina.Nasogastric tube terminates at the gastric body.  A right-sided PICC line is new, and terminates at the low SVC versus cavoatrial junction. Midline trachea. Normal heart size for level of inspiration. No pleural effusion or pneumothorax. Low lung volumes with improved bibasilar airspace disease. IMPRESSION: Right-sided PICC line terminates at the low SVC versus superior caval/atrial junction. No pneumothorax. Improved bibasilar aeration, with mild atelectasis remaining. Electronically Signed   By: Abigail Miyamoto M.D.   On: 05/22/2015 17:59   Ct Abdomen Pelvis W Contrast  05/27/2015  CLINICAL DATA:  One week postop from bowel resection for bowel obstruction. Fever. Bladder carcinoma. EXAM: CT ABDOMEN AND PELVIS WITH CONTRAST TECHNIQUE: Multidetector CT imaging of the abdomen and pelvis was performed using the standard protocol following bolus administration of intravenous contrast. CONTRAST:  148mL OMNIPAQUE IOHEXOL 350 MG/ML SOLN, 1 OMNIPAQUE IOHEXOL 240 MG/ML SOLN COMPARISON:  05/17/2015 FINDINGS: Lower chest: New small bilateral pleural effusions and bibasilar atelectasis demonstrated. Hepatobiliary: No masses or other significant abnormality. Prior cholecystectomy noted. No evidence of biliary dilatation. Pancreas: No mass, inflammatory changes, or other significant abnormality. Spleen: Within normal limits in size and appearance. Adrenals/Urinary Tract: No evidence of adrenal or renal masses. Tiny sub-cm right renal cyst remains stable. No evidence of hydronephrosis. Patient has undergone cystectomy with ileal loop diversion into pelvic neobladder. A Foley catheter is seen within the neobladder which is decompressed. Stomach/Bowel: Recent postsurgical changes are seen from repair of large ventral abdominal wall hernia. Mild wall thickening of the transverse colon is seen near the site of the hernia repair likely due to postop edema. No evidence of obstruction. There is no evidence of recurrent hernia. No evidence of abscess or free air. Smith/Lymphatic: No  pathologically enlarged lymph nodes. 3.0 cm infrarenal abdominal aortic aneurysm remains stable. No evidence of aneurysm leak or rupture. Reproductive: No mass or other significant abnormality. Other: None. Musculoskeletal:  No suspicious bone lesions identified. IMPRESSION: Expected postoperative changes from repair recent ventral abdominal wall hernia. Low residual transverse colonic wall thickening near the hernia site is likely due to postop edema. No evidence of bowel obstruction or abscess. New small bilateral pleural effusions and bibasilar atelectasis. Stable postop changes from previous cystectomy and ileal loop diversion into pelvic neobladder. No evidence of hydronephrosis. No evidence of recurrent or metastatic carcinoma. Stable 3.0 cm infrarenal abdominal aortic aneurysm. Recommend followup by ultrasound in 3 years. This recommendation follows ACR consensus guidelines: White Paper of the ACR Incidental Findings Committee II on Smith Findings. Natasha [facility] Coll Radiol 2013; 10:[id number] Electronically Signed   By: Earle Gell M.D.   On: 05/27/2015 16:07   Ct Abdomen Pelvis W Contrast  05/17/2015  CLINICAL DATA:  Generalized abdominal and chest pain since this afternoon. History bladder cancer. EXAM: CT ABDOMEN AND PELVIS WITH CONTRAST TECHNIQUE: Multidetector CT imaging of the abdomen and pelvis was performed using the standard protocol following bolus administration of intravenous contrast. CONTRAST:  122mL OMNIPAQUE IOHEXOL 300 MG/ML  SOLN COMPARISON:  07/27/2013.  04/12/2011 FINDINGS: Lower chest: Stable appearance of chronic right pleural thickening posteriorly. Hepatobiliary: No focal abnormality within the liver parenchyma. Gallbladder surgically absent. No intrahepatic or extrahepatic biliary dilation. Pancreas: No focal mass lesion. No dilatation of the main duct. No intraparenchymal cyst. No peripancreatic edema. Spleen: No splenomegaly. No focal mass lesion. Adrenals/Urinary Tract: Stable tiny  bilateral  adrenal nodules consistent with adenomas. Cortical scarring noted in the right kidney. No enhancing lesion in either kidney. Stable small right renal cysts. Left kidney is unremarkable. Mild right hydronephrosis is associated with right hydroureter with the right ureter reimplanted along the left anterior bladder wall. On previous exams, at this reimplanted ureter, potentially a conduit, had thin walls but demonstrates a thick enhancing wall on today's study. There is some mild wall thickening in the right aspect of the neobladder. Left ureter also appears to tiny into the conduit. Stomach/Bowel: Stomach is mildly distended. Duodenum is normally positioned as is the ligament of Treitz. Multiple small bowel loops extend through a complex ventral hernia that also contains transverse colon. There is a dilated small bowel loop measuring up to 3.8 cm that tracks into the hernia with decompressed, non-opacified small bowel exiting the hernia sac. The terminal ileum is normal. The appendix is not visualized, but there is no edema or inflammation in the region of the cecum. Transverse colon extends into the hernia sac without proximal colonic dilatation. Diverticular change noted in the left colon without diverticulitis. Smith/Lymphatic: Abdominal aorta measures up to 3.0 cm and diameter, consistent with aneurysm. Atherosclerotic calcification is associated in the abdominal aortic wall. There is no gastrohepatic or hepatoduodenal ligament lymphadenopathy. No intraperitoneal or retroperitoneal lymphadenopathy. No pelvic sidewall lymphadenopathy. Reproductive: Prostate gland appears to be surgically absent. Other: No intraperitoneal free fluid. Musculoskeletal: Bone windows reveal no worrisome lytic or sclerotic osseous lesions. Bilateral pars interarticularis defects are seen at L5. IMPRESSION: Large complex ventral hernia as seen on multiple previous imaging studies. Small bowel and colon is contained within  the hernia sac and on today's study, and there is a distended contrast filled small bowel loop that appears to track into the hernia where multiple dilated small bowel loops are present. Small bowel leaving the hernia sac is decompressed and non-opacified. As such, imaging features are suspicious for incarcerated small bowel with obstruction related to the ventral hernia. Patient appears to be status post cystectomy with urinary diversion to a neobladder. There is some wall thickening in the urinary conduit as it ties into the neobladder, but this is nonspecific. On previous studies, the wall to this structure was very thin and the wall thickening today makes inflammation a concern. There is also some new wall thickening associated with the right aspect of the neobladder. Stable tiny bilateral adrenal nodules, likely representing adenomas. Abdominal aortic atherosclerosis with 3.0 cm aneurysm, stable. Electronically Signed   By: Misty Stanley M.D.   On: 05/17/2015 23:32   Dg Chest Port 1 View  05/28/2015  CLINICAL DATA:  Acute respiratory failure, history bladder cancer, renal cancer, pneumonia, smoking EXAM: PORTABLE CHEST 1 VIEW COMPARISON:  Portable exam 0608 hours compared to 05/25/2015 FINDINGS: Tip of endotracheal tube projects 1.8 cm above carina. Nasogastric tube coiled in proximal stomach. RIGHT arm PICC line tip projects over SVC. Upper normal heart size. Stable mediastinal contours and pulmonary vascularity. Perihilar to basilar infiltrates again identified question mild pulmonary edema. Bibasilar atelectasis. Cannot completely exclude small RIGHT pleural effusion. No pneumothorax. IMPRESSION: Question mild pulmonary edema with coexistent bibasilar atelectasis. Electronically Signed   By: Lavonia Dana M.D.   On: 05/28/2015 08:06   Portable Chest Xray  05/25/2015  CLINICAL DATA:  Hypoxia EXAM: PORTABLE CHEST 1 VIEW COMPARISON:  May 24, 2015 FINDINGS: Endotracheal tube tip is 2.5 cm above the carina.  Central catheter tip is in the superior vena cava. Nasogastric tube tip and side port are  in the stomach. No pneumothorax. There are small pleural effusions bilaterally with patchy bibasilar atelectasis. There is slight interstitial edema in the bases. Heart is borderline prominent with pulmonary vascularity within normal limits. No adenopathy evident. IMPRESSION: Tube and catheter positions as described without pneumothorax. Suspect a degree of congestive heart failure. There is patchy bibasilar atelectasis as well. No airspace consolidation evident. Electronically Signed   By: Lowella Grip III M.D.   On: 05/25/2015 09:14   Dg Chest Port 1 View  05/24/2015  CLINICAL DATA:  Acute respiratory failure EXAM: PORTABLE CHEST 1 VIEW COMPARISON:  05/22/2015 FINDINGS: Right-sided PICC line, endotracheal tube and nasogastric catheter are all again seen and stable in appearance. Cardiac shadow is within normal limits. Increasing bibasilar atelectasis is noted. No bony abnormality is seen. IMPRESSION: Increasing bibasilar atelectasis. Electronically Signed   By: Inez Catalina M.D.   On: 05/24/2015 07:42   Dg Chest Port 1 View  05/22/2015  CLINICAL DATA:  Respiratory failure EXAM: PORTABLE CHEST 1 VIEW COMPARISON:  05/21/2015 FINDINGS: Endotracheal tube and NG tube are unchanged. Stable cardiac silhouette. There is increase in bibasilar opacities. Upper lungs are clear. IMPRESSION: 1. Stable support apparatus. 2. Increased bibasilar linear opacities representing atelectasis versus infiltrate. Electronically Signed   By: Suzy Bouchard M.D.   On: 05/22/2015 07:28   Dg Chest Port 1 View  05/21/2015  CLINICAL DATA:  Intubated, smoker EXAM: PORTABLE CHEST 1 VIEW COMPARISON:  05/20/2015 FINDINGS: Endotracheal tube is 5 cm above the carina. NG tube is in the stomach. The heart is borderline in size. Bibasilar atelectasis or infiltrates, right greater than left. Possible small right effusion. IMPRESSION: Bibasilar  atelectasis or infiltrates, right greater than left. Small right effusion. Electronically Signed   By: Rolm Baptise M.D.   On: 05/21/2015 08:13   Dg Chest Port 1 View  05/20/2015  CLINICAL DATA:  Acute onset of cough.  Initial encounter. EXAM: PORTABLE CHEST 1 VIEW COMPARISON:  Chest radiograph from 07/14/2014 FINDINGS: The lungs are well-aerated. Peribronchial thickening is noted. Mild bibasilar opacities may reflect mild interstitial edema or possibly pneumonia. There is no evidence of pleural effusion or pneumothorax. The cardiomediastinal silhouette is within normal limits. No acute osseous abnormalities are seen. IMPRESSION: Peribronchial thickening noted. Mild bibasilar airspace opacities may reflect mild interstitial edema or possibly pneumonia. Electronically Signed   By: Garald Balding M.D.   On: 05/20/2015 05:00   Dg Abd 2 Views  05/20/2015  CLINICAL DATA:  70 year old male -followup small bowel obstruction. EXAM: ABDOMEN - 2 VIEW COMPARISON:  05/19/2015 and prior radiographs.  05/17/2015 CT FINDINGS: Dilated small bowel loops within the abdomen again noted. Gas and stool in the colon again noted. Cholecystectomy clips again identified. IMPRESSION: Little significant change in dilated small bowel loops. Electronically Signed   By: Margarette Canada M.D.   On: 05/20/2015 09:59   Dg Abd 2 Views  05/19/2015  CLINICAL DATA:  Ventral hernia with obstruction EXAM: ABDOMEN - 2 VIEW COMPARISON:  05/18/2015 FINDINGS: NG tube has been removed. Again noted gaseous distended small bowel loops mid abdomen suspicious for ileus or partial bowel obstruction. Postcholecystectomy surgical clips. Moderate stool and gas noted in right colon and splenic flexure of the colon. Surgical clips are noted within pelvis. No evidence of free abdominal air. IMPRESSION: Again noted gaseous distended small bowel loops mid abdomen suspicious for ileus or partial bowel obstruction. Postcholecystectomy surgical clips. Moderate stool and  gas noted in right colon and splenic flexure of the colon. Electronically Signed  By: Lahoma Crocker M.D.   On: 05/19/2015 08:18   Dg Abd Portable 2v  05/18/2015  CLINICAL DATA:  Patient with generalized abdominal pain. Abdominal discomfort. EXAM: PORTABLE ABDOMEN - 2 VIEW COMPARISON:  CT abdomen pelvis 05/17/2015 FINDINGS: Enteric tube tip and side-port project over the stomach. Cholecystectomy clips. Lung bases are clear. There multiple prominent gaseous distended loops of small bowel within the central abdomen measuring up to 4.3 cm. Stool and gas demonstrated within the colon. Pelvic surgical clips. Lumbar spine degenerative changes. IMPRESSION: Enteric tube tip and side-port project over the stomach. Multiple gaseous distended loops of small bowel within the central abdomen concerning for early and/or partial bowel obstruction. Electronically Signed   By: Lovey Newcomer M.D.   On: 05/18/2015 11:19      Assessment/Plan 1. Diminished pulses RLE. Consistent with significant peripheral arterial disease. No ulcerations, open wounds, infection, or gangrenous changes that would require immediate evaluation and intervention. Difficult to assess the patient's symptoms of claudication or pain due to impaired mental status at this time. If he becomes more awake and complains of symptoms worrisome for ischemic rest pain, angiogram can be performed while in the hospital. If this is not the case, outpatient follow-up with ABIs and lower extremity arterial duplex would be recommended for further evaluation without critical limb threatening symptoms. and continue to use heel protectors and other padding to avoid pressure ulcerations on both lower extremities please. 2. Small abdominal aortic aneurysm measuring just over 3.0 cm in maximal diameter. Represents no immediate risk to the patient, but certainly will need to be followed in the future and likely be checked on an annual basis with duplex and I will be happy to  arrange this at his follow-up visit. 3. Incarcerated ventral hernia with small bowel resection. Postoperative patient with prolonged course secondary to alcohol withdrawal. Seems to be doing reasonably well from a bowel resection standpoint 4. Alcohol withdrawal. Has now been in the hospital well over a week and is extubated. This may be contributing to his mental status, but he appears to be out of the eminent life-threatening portion of the withdrawals. 5. Tobacco dependence. Certainly has risk factors for peripheral arterial disease and this would be a primary 1. He is not able to be counseled on tobacco cessation at this point due to his mental status, but this needs to be addressed going forward as well. 6. Bladder cancer history.     DEW,JASON, MD  05/29/2015 9:31 AM

## 2015-05-29 NOTE — Progress Notes (Signed)
PULMONARY / CRITICAL CARE MEDICINE   Name: Brady Smith MRN: [id number] DOB: 07-Nov-1945    ADMISSION DATE:  05/17/2015 CONSULTATION DATE:  02/26  REFERRING MD:  Adonis Huguenin   Subjective Extubated yesterday, lethargic but arousable No acute issues overnight. onminimal oxygen support, weak cough reflex   VITAL SIGNS: BP 158/73 mmHg  Pulse 95  Temp(Src) 99.9 F (37.7 C) (Rectal)  Resp 31  Ht 5\' 8"  (1.727 m)  Wt 171 lb 1.2 oz (77.6 kg)  BMI 26.02 kg/m2  SpO2 96%  HEMODYNAMICS:    VENTILATOR SETTINGS: Vent Mode:  [-] PSV;CPAP FiO2 (%):  [35 %] 35 % PEEP:  [5 cmH20] 5 cmH20 Pressure Support:  [5 cmH20] 5 cmH20  INTAKE / OUTPUT: I/O last 3 completed shifts: In: 3627.8 [I.V.:176; NG/GT:218.2; IV Piggyback:200] Out: 5715 [Urine:5500; Emesis/NG output:150; Drains:65]   ROS limited due to lethargy  PHYSICAL EXAMINATION: General: NAD Neuro: Moves extremities, follows basic commands HEENT: NCAT Cardiovascular: RRR, S1/S2, no MRG Lungs: Bilateral airflow, mild expiratory wheezes, coarse rhonchi, diminished breath sounds bilaterally Abdomen: post op changes, abd soft, diminished to absent BS Ext: warm, no edema  LABS:  BMET  Recent Labs Lab 05/27/15 0331 05/28/15 0918 05/29/15 0822  NA 141 138 140  K 3.7 3.6 3.4*  CL 106 102 103  CO2 32 31 31  BUN 24* 23* 20  CREATININE 0.80 0.86 0.82  GLUCOSE 151* 179* 192*    Electrolytes  Recent Labs Lab 05/27/15 0331 05/28/15 0918 05/29/15 0822  CALCIUM 7.4* 7.4* 7.7*  MG 2.1 2.2 2.1  PHOS 3.5 3.6 3.1    CBC  Recent Labs Lab 05/25/15 0411 05/27/15 1247 05/28/15 0918  WBC 6.4 6.8 6.1  HGB 12.0* 12.0* 11.1*  HCT 35.5* 36.3* 32.8*  PLT 110* 195 206    Coag's No results for input(s): APTT, INR in the last 168 hours.  Sepsis Markers No results for input(s): LATICACIDVEN, PROCALCITON, O2SATVEN in the last 168 hours.  ABG  Recent Labs Lab 05/24/15 1144 05/25/15 1544 05/28/15 1222  PHART 7.41 7.46*  7.44  PCO2ART 48 44 47  PO2ART 69* 84 78*    Liver Enzymes  Recent Labs Lab 05/23/15 0614  AST 47*  ALT 55  ALKPHOS 37*  BILITOT 0.9  ALBUMIN 1.8*    Cardiac Enzymes No results for input(s): TROPONINI, PROBNP in the last 168 hours.  Glucose  Recent Labs Lab 05/28/15 1116 05/28/15 1539 05/28/15 1955 05/28/15 2343 05/29/15 0336 05/29/15 0725  GLUCAP 178* 157* 176* 149* 192* 158*    No results found. MAJOR EVENTS/TEST RESULTS: VENT DAY #6 02/23 Admission 02/23 IM consult 02/25 Laparotomy, strangulated small bowel, small bowel resection 03/01 EXTUBATED 03/02 RE-INTUBATED 03/05 EXTUBATED  INDWELLING DEVICES:: ETT 02/25 >>   MICRO DATA: Urine 02/23 >> NEG MRSA PCR 02/25 >> NEG REsp culture-CITROBACTER SPECIES ANTIMICROBIALS:  Ertapenem 02/25 >>   Discussion 70 yo white male with acute s/p laparotomy for acute small bowel infarction complicated by acute resp failure and metabolic encephalopathy and acute etoh withdrawal requiring vent support with CITROBACTER PNEUMONIA  ASSESSMENT / PLAN: PULMONARY -extubated, aspiration precautions -oxygen as needed     GASTROINTESTINAL A:   Acute small bowel infarction S/P laparotomy P:   Started on TF's, plan to wean off TPN PICC line to be assessed  HEMATOLOGIC A:   Very mild thrombocytopenia-resolved P:  DVT px: LMWH Monitor CBC intermittently Transfuse per usual ICU guidelines  INFECTIOUS Citrobacter pneumonia -iv abx as prescribed -will discuss LOT with pharmacy  ENDOCRINE A:   Mild hyperglycemia without prior dx of DM P:   Blood glucose monitoring every 4 hours with sliding scale insulin coverage per protocol   NEUROLOGIC A:   Heavy ETOH use Post op pain -sedatives as needed   Plan today: PT and speech therapy consults, out of bed to chair Start TF's and assess tolerance     The Patient requires high complexity decision making for assessment and support, frequent evaluation and  titration of therapies.   Corrin Parker, M.D.  [facility]  Medical Director [place] Director [facility] Cardio-Pulmonary Department

## 2015-05-29 NOTE — Progress Notes (Signed)
70 yr old s/p incarcerated ventral hernia, extubated yesterday.  He is slowly improving but remains delirious, however answered questions appropraitely this AM. Denied pain  Filed Vitals:   05/29/15 1700 05/29/15 1800  BP: 159/76 151/73  Pulse: 91 93  Temp: 100.2 F (37.9 C) 100.2 F (37.9 C)  Resp: 29 31   I/O last 3 completed shifts: In: 3756.4 [Other:40; NG/GT:403.2; IV Piggyback:400] Out: 6273 [Urine:6230; Drains:43]     PE:  Gen: NAD Res: crackles in bases Cardio: rRR [id number], wound clean and dry, JP drain with serous drainage Ext: 2+ pulses, no edema  CBC Latest Ref Rng 05/28/2015 05/27/2015 05/25/2015  WBC 3.8 - 10.6 K/uL 6.1 6.8 6.4  Hemoglobin 13.0 - 18.0 g/dL 11.1(L) 12.0(L) 12.0(L)  Hematocrit 40.0 - 52.0 % 32.8(L) 36.3(L) 35.5(L)  Platelets 150 - 440 K/uL 206 195 110(L)   CMP Latest Ref Rng 05/29/2015 05/28/2015 05/27/2015  Glucose 65 - 99 mg/dL 192(H) 179(H) 151(H)  BUN 6 - 20 mg/dL 20 23(H) 24(H)  Creatinine 0.61 - 1.24 mg/dL 0.82 0.86 0.80  Sodium 135 - 145 mmol/L 140 138 141  Potassium 3.5 - 5.1 mmol/L 3.4(L) 3.6 3.7  Chloride 101 - 111 mmol/L 103 102 106  CO2 22 - 32 mmol/L 31 31 32  Calcium 8.9 - 10.3 mg/dL 7.7(L) 7.4(L) 7.4(L)    A/P: improving from surgical standpoint but still with ICU delirium, EtOH withdrawal likely less of a factor at this point.  He can increase TF to goal and d/c tpn. Speech and physical therpay ordered

## 2015-05-29 NOTE — Plan of Care (Signed)
Problem: Education: Goal: Knowledge of [facility] Education information/materials will improve Outcome: Not Progressing Patient still confused, occasional agitation. Sister visited today and updated on progress.   Problem: Pain Managment: Goal: General experience of comfort will improve Outcome: Progressing Patient tachypneic but unlabored breathing.  Problem: Physical Regulation: Goal: Ability to maintain clinical measurements within normal limits will improve Outcome: Progressing Vital signs stable on 2 L nasal cannula. No prn hydralazine given this shift.

## 2015-05-29 NOTE — Evaluation (Signed)
Physical Therapy Evaluation Patient Details Name: Brady Smith MRN: [id number] DOB: [birth year] Today's Date: 05/29/2015   History of Present Illness  presented to ER secondary to worsening abdominal hernia (previous recommended for follow up at tertiary care center); admitted with SBO related to incarcerated ventral hernia.  Status post exploratory laparotomy with small bowel resection (2/25); post-op course complicated by post-op shock, ETOH withdrawal, prolonged intubation (2/25-3/1, 3/2-3/5), afib rate-controlled and mild ileus.  Intermittent restless and agitated, requiring ativan/precedex for management.  Clinical Impression  Upon evaluation, patient opens eyes to voice and answers some questions; oriented to self only.  Intermittently follows simple, one-step commands; often requires hand-over-hand assist for task initiation.  Bilat UE/LEs globally weak and deconditioned, with strength only 2+ to 3-/5 throughout all extremities.  Active assist required to complete all bed-level activities.  Patient unsafe/unable to attempt additional mobility progression due to poor command following, poor orientation and decreased alertness.  Anticipate heavy assist +2 when appropriate. Would benefit from skilled PT to address above deficits and promote optimal return to PLOF; recommend transition to STR upon discharge from acute hospitalization.      Follow Up Recommendations SNF    Equipment Recommendations       Recommendations for Other Services       Precautions / Restrictions Precautions Precautions: Fall Precaution Comments: NPO, NGT, L JP drain, CIWA Restrictions Weight Bearing Restrictions: No      Mobility  Bed Mobility Overal bed mobility: Needs Assistance Bed Mobility: Rolling Rolling: Mod assist         General bed mobility comments: hand-over-hand assist for UE placement and task initiation  Transfers                 General transfer comment: unsafe/unable to  attempt due to poor command following, poor orientation and decreased alertness  Ambulation/Gait             General Gait Details: unsafe/unable to attempt due to poor command following, poor orientation and decreased alertness  Stairs            Wheelchair Mobility    Modified Rankin (Stroke Patients Only)       Balance                                             Pertinent Vitals/Pain Pain Assessment: Faces Faces Pain Scale: Hurts little more Pain Location: abdominal pain Pain Descriptors / Indicators: Aching;Grimacing Pain Intervention(s): Limited activity within patient's tolerance;Monitored during session;Repositioned    Home Living                   Additional Comments: Patient unable to provide, aside from the fact that he lives in [place].  Per chart, is resident of boarding home (no additional details available at this time).    Prior Function           Comments: Patient unable to provide; will verity with family/facility as able.     Hand Dominance        Extremity/Trunk Assessment   Upper Extremity Assessment: Generalized weakness (globally weak and deconditioned, strength grossly 3-/5)           Lower Extremity Assessment: Generalized weakness (globally weak and deconditioned; strength grossly 2+ to 3-/5 throughout)         Communication   Communication:  (speech garbled, poor phonation; limited intelligibility)  Cognition Arousal/Alertness: Lethargic Behavior During Therapy: Restless Overall Cognitive Status: Difficult to assess (oriented to self only; intermittently follows simple, one-step commands (50%); poor processing and task initiation)                      General Comments      Exercises Other Exercises Other Exercises: Supine UE/LE therex, 1x10, act assist ROM for strength/flexibility:  shoulder flex/ext/abduct/adduct/IR/ER, elbow flex/ext, forearm pronation/supination; ankle PF/DF,  heel slides, hip abduct/adduct.  Constant cuing for alertness and active effort with all therex.      Assessment/Plan    PT Assessment Patient needs continued PT services  PT Diagnosis Difficulty walking;Generalized weakness;Acute pain   PT Problem List Decreased strength;Decreased range of motion;Decreased activity tolerance;Decreased balance;Decreased mobility;Decreased coordination;Decreased cognition;Decreased knowledge of use of DME;Decreased knowledge of precautions;Cardiopulmonary status limiting activity;Impaired sensation;Decreased safety awareness;Decreased skin integrity;Pain  PT Treatment Interventions DME instruction;Gait training;Stair training;Functional mobility training;Therapeutic activities;Therapeutic exercise;Balance training;Patient/family education;Cognitive remediation   PT Goals (Current goals can be found in the Care Plan section) Acute Rehab PT Goals PT Goal Formulation: Patient unable to participate in goal setting Time For Goal Achievement: 06/12/15 Potential to Achieve Goals: Fair Additional Goals Additional Goal #1: Assess and establish goals for OOB activity as appropriate.    Frequency Min 2X/week   Barriers to discharge Decreased caregiver support;Inaccessible home environment      Co-evaluation               End of Session   Activity Tolerance: Patient limited by lethargy Patient left: in bed;with call bell/phone within reach;with bed alarm set Nurse Communication: Mobility status         Time: 1120-1136 PT Time Calculation (min) (ACUTE ONLY): 16 min   Charges:   PT Evaluation $PT Eval Moderate Complexity: 1 Procedure PT Treatments $Therapeutic Exercise: 8-22 mins   PT G Codes:        Olivia Pavelko H. Owens Shark, PT, DPT, NCS 05/29/2015, 12:13 PM [phone]

## 2015-05-29 NOTE — Evaluation (Signed)
Clinical/Bedside Swallow Evaluation Patient Details  Name: Brady Smith MRN: [id number] Date of Birth: [birth year]  Today's Date: 05/29/2015 Time: SLP Start Time (ACUTE ONLY): 1400 SLP Stop Time (ACUTE ONLY): 1500 SLP Time Calculation (min) (ACUTE ONLY): 60 min  Past Medical History:  Past Medical History  Diagnosis Date  . History of kidney cancer   . Bladder cancer ([place])   . Pneumonia    Past Surgical History:  Past Surgical History  Procedure Laterality Date  . Hernia repair    . Cholecystectomy    . Cystectomy w/ continent diversion  1996  . Prostatectomy  1996  . Tonsillectomy    . Colonoscopy  2000  . Laparotomy N/A 05/20/2015    Procedure: EXPLORATORY LAPAROTOMY;  Surgeon: Clayburn Pert, MD;  Location: [facility] ORS;  Service: General;  Laterality: N/A;  . Bowel resection  05/20/2015    Procedure: SMALL BOWEL RESECTION;  Surgeon: Clayburn Pert, MD;  Location: [facility] ORS;  Service: General;;   HPI:  Pt is a 70 y.o. male w/ h/o kidney and bladder Ca, COPD smoking ~2 packs of cigarettes daily(currenlty), pneumonia, and other medical issues who presents to emergency department for a worsening abdominal hernia. Patient states that after eating a large volume of corn last night, he started having some nausea followed by swelling in his midline and a new area of "bump". He's had a known history of a very large midline hernia and this is his fourth presentation to our group for this identical complaint. Patient states that he was told that the last 2 visits that he should seek his hernia repair but he did not follow up with this advice. Patient currently only complaining of abdominal pain at the site of his hernia. He denies any current nausea, vomiting, chest pain, shortness of breath. He has been passing flatus and his last bowel movement was earlier today albeit small per the patient. Patient admits to smoking 2 packs of cigarettes per day. Patient also states that he drinks one to 2 cans of  beer per day as well. During this hospitalization, pt had bowel surgery and remained orally intubated ~1 week d/t respiratory status. He had a bowel resection over a week ago for an incarcerated ventral hernia with necrotic bowel. He had a prolonged postoperative course secondary to alcohol withdrawals and has just come off the ventilator this past weekend. He remains very lethargic and is unable to provide any history and all of the history is obtained from the previous medical record. It was noted that his right foot did not have palpable pedal pulses and was slightly cooler than the left. He does not have any open ulcerations, infection, gangrenous changes, or nonviable tissue of the right foot that I can see. He is unable to provide any history in terms of whether or not he has claudication or describes any pain. The nurses have used the appropriate padding devices on the right heel to avoid any pressure ulcerations. He does have a palpable pulse in the left foot although it is not prominent. Continues w/ the NG in place along w/ TPN for nutritional support. Pt is drowsy; weaning from Valium per NSG; acute encephalopathy per MD note. Pt seen for BSE w/ NG in place.    Assessment / Plan / Recommendation Clinical Impression  Pt appears at high risk for aspiration at this time sec. to lethargy and decreased alertness for follow through w/ task of oral intake. Unable to fully assess degree of oropharyngeal phase dyspahgia  sec. to his overall presenation. Pt exhibited delayed and immediate coughing w/ tsps of thin liquids(water) x2/2 trials; throat clearing w/ trials of ice chips. Suspect pt's lethargy plays a significant part in his presentation w/ po trials, although noted pt has been orally intubated for an extended time frame. Pt also exhibited refusal behaviors when offered ice chips/po's. Due to pt's overt s/s of apsiration w/ trials of ice chips and thin liquids via tsp, as well as pt's lethargy and need  for max. verbal/tactile cues for follow through w/ tasks, rec. continued NPO status w/ nutritional supplement via NG and TPN. Rec. ST f/u for ongoing assessment of pt's swallow function and ability to initiate an oral diet. NSG updated.     Aspiration Risk  Severe aspiration risk    Diet Recommendation  NPO; strict aspiration precautions; Oral Care frequently d/t NPO status  Medication Administration: Via alternative means    Other  Recommendations Recommended Consults:  (dietician following) Oral Care Recommendations: Oral care QID;Staff/trained caregiver to provide oral care (NPO) Other Recommendations:  (TBD)   Follow up Recommendations  Skilled Nursing facility (TBD)    Frequency and Duration min 3x week  2 weeks       Prognosis Prognosis for Safe Diet Advancement: Guarded Barriers to Reach Goals:  (medical status; lethargy)      Swallow Study   General Date of Onset: 05/17/15 HPI: Pt is a 70 y.o. male w/ h/o kidney and bladder Ca, COPD smoking ~2 packs of cigarettes daily(currenlty), pneumonia, and other medical issues who presents to emergency department for a worsening abdominal hernia. Patient states that after eating a large volume of corn last night, he started having some nausea followed by swelling in his midline and a new area of "bump". He's had a known history of a very large midline hernia and this is his fourth presentation to our group for this identical complaint. Patient states that he was told that the last 2 visits that he should seek his hernia repair but he did not follow up with this advice. Patient currently only complaining of abdominal pain at the site of his hernia. He denies any current nausea, vomiting, chest pain, shortness of breath. He has been passing flatus and his last bowel movement was earlier today albeit small per the patient. Patient admits to smoking 2 packs of cigarettes per day. Patient also states that he drinks one to 2 cans of beer per day as  well. During this hospitalization, pt had bowel surgery and remained orally intubated ~1 week d/t respiratory status. He had a bowel resection over a week ago for an incarcerated ventral hernia with necrotic bowel. He had a prolonged postoperative course secondary to alcohol withdrawals and has just come off the ventilator this past weekend. He remains very lethargic and is unable to provide any history and all of the history is obtained from the previous medical record. It was noted that his right foot did not have palpable pedal pulses and was slightly cooler than the left. He does not have any open ulcerations, infection, gangrenous changes, or nonviable tissue of the right foot that I can see. He is unable to provide any history in terms of whether or not he has claudication or describes any pain. The nurses have used the appropriate padding devices on the right heel to avoid any pressure ulcerations. He does have a palpable pulse in the left foot although it is not prominent. Continues w/ the NG in place along w/  TPN for nutritional support. Pt is drowsy; weaning from Valium per NSG; acute encephalopathy per MD note. Pt seen for BSE w/ NG in place.  Type of Study: Bedside Swallow Evaluation Previous Swallow Assessment: none Diet Prior to this Study: Regular;Thin liquids (suspected) Temperature Spikes Noted: Yes (101.3; wbc 6.1) Respiratory Status: Nasal cannula (3 liters) History of Recent Intubation: Yes Length of Intubations (days): 7 days Date extubated: 05/28/15 Behavior/Cognition: Confused;Lethargic/Drowsy;Requires cueing;Doesn't follow directions Oral Cavity Assessment: Dry Oral Care Completed by SLP: Yes Oral Cavity - Dentition: Missing dentition (front lower; some molars) Vision:  (n/a) Self-Feeding Abilities: Total assist Patient Positioning: Upright in bed Baseline Vocal Quality:  (mostly nonverba; phonations x2 mumbled) Volitional Cough: Cognitively unable to elicit Volitional  Swallow: Unable to elicit    Oral/Motor/Sensory Function Overall Oral Motor/Sensory Function:  (overall weakness and reduced movements d/t lethargy)   Ice Chips Ice chips: Impaired Presentation: Spoon (fed; 2 trials) Oral Phase Impairments: Poor awareness of bolus;Reduced labial seal;Reduced lingual movement/coordination Oral Phase Functional Implications: Prolonged oral transit Pharyngeal Phase Impairments: Throat Clearing - Delayed Other Comments: required max cues to attend to task; pt then closed lips and turned head when offered further trials.    Thin Liquid Thin Liquid: Impaired Presentation: Spoon (fed; 2 trials) Oral Phase Impairments: Reduced labial seal;Reduced lingual movement/coordination;Poor awareness of bolus Oral Phase Functional Implications: Prolonged oral transit (anterior spillage) Pharyngeal  Phase Impairments: Cough - Immediate;Cough - Delayed (x2/2 trials) Other Comments: required max verbal cues to attend to task    Nectar Thick Nectar Thick Liquid: Not tested   Honey Thick Honey Thick Liquid: Not tested   Puree Puree: Not tested   Solid   GO   Solid: Not tested       [name], MS, CCC-SLP  Lui Bellis 05/29/2015,4:00 PM

## 2015-05-29 NOTE — Progress Notes (Signed)
PICC RN came to change out PICC line per MD after lumen unable to flush and had had required two doses of tPA during the night to unclog it. PICC RN assessed current PICC line and changed end caps with RN. Lumen now with blood return and easily flushed. MD, Dr. Mortimer Fries updated, and discontinued changing out PICC line.

## 2015-05-29 NOTE — Progress Notes (Signed)
PARENTERAL NUTRITION CONSULT NOTE   Pharmacy Consult for electrolytes and insulin management Indication: ICU patient requiring mechanical ventilation and TPN  No Known Allergies  Patient Measurements: Height: 5\' 8"  (172.7 cm) Weight: 171 lb 1.2 oz (77.6 kg) IBW/kg (Calculated) : 68.4   Vital Signs: Temp: 99.9 F (37.7 C) (03/06 0700) Temp Source: Rectal (03/06 0700) BP: 158/73 mmHg (03/06 0700) Pulse Rate: 95 (03/06 0700) Intake/Output from previous day: 03/05 0701 - 03/06 0700 In: 2538.8 [NG/GT:218.2; IV Piggyback:200; TPN:2120.7] Out: 3650 [Urine:3630; Drains:20] Intake/Output from this shift:    Labs:  Recent Labs  05/27/15 1247 05/28/15 0918  WBC 6.8 6.1  HGB 12.0* 11.1*  HCT 36.3* 32.8*  PLT 195 206     Recent Labs  05/27/15 0331 05/28/15 0918 05/29/15 0822  NA 141 138 140  K 3.7 3.6 3.4*  CL 106 102 103  CO2 32 31 31  GLUCOSE 151* 179* 192*  BUN 24* 23* 20  CREATININE 0.80 0.86 0.82  CALCIUM 7.4* 7.4* 7.7*  MG 2.1 2.2 2.1  PHOS 3.5 3.6 3.1   Estimated Creatinine Clearance: 82.3 mL/min (by C-G formula based on Cr of 0.82).    Recent Labs  05/28/15 2343 05/29/15 0336 05/29/15 0725  GLUCAP 149* 192* 158*    Medical History: Past Medical History  Diagnosis Date  . History of kidney cancer   . Bladder cancer ([place])   . Pneumonia     Medications:  Scheduled:  . antiseptic oral rinse  7 mL Mouth Rinse QID  . budesonide (PULMICORT) nebulizer solution  0.5 mg Nebulization BID  . ciprofloxacin  400 mg Intravenous Q12H  . diazepam  2.5 mg Intravenous Q12H  . enoxaparin (LOVENOX) injection  40 mg Subcutaneous Q24H  . feeding supplement (VITAL HIGH PROTEIN)  1,000 mL Per Tube Q24H  . free water  30 mL Per Tube 3 times per day  . insulin aspart  2-6 Units Subcutaneous 6 times per day  . ipratropium-albuterol  3 mL Nebulization Q6H  . pantoprazole (PROTONIX) IV  40 mg Intravenous Q24H  . potassium chloride  10 mEq Intravenous Q1 Hr x 2  .  thiamine  100 mg Intravenous Daily   Infusions:  . Marland KitchenTPN (CLINIMIX-E) Adult 83 mL/hr at 05/29/15 0700  . dexmedetomidine Stopped (05/28/15 1400)    Insulin Requirements in the past 24 hours:  22  Current Nutrition:  TPN 5/20 at 83 mL/hr   Plan:   Will continue patient on SSI Q4hr while patient is on TPN. Will replace potassium with 2 runs of KCl 10 meq iv x 2. Will f/u AM labs.   Pharmacy will continue to monitor and adjust per consult.    Ulice Dash, PharmD Clinical Pharmacist   05/29/2015,11:47 AM

## 2015-05-29 NOTE — Consult Note (Signed)
[facility] Infectious Disease     Reason for Consult:Fevers   Referring Physician: Earleen Newport Date of Admission:  05/17/2015   Principal Problem:   Small bowel obstruction ([place]) Active Problems:   Incarcerated ventral hernia   Essential hypertension, malignant   Urinary retention   UTI (urinary tract infection)   Acute respiratory failure (HCC)   ETOH abuse   Acute encephalopathy   HPI: Brady Smith is a 70 y.o. male history of bladder cancer, ETOH, COPD, chronic respiratory failure, admitted initially with incarcerated ventral hernia. He underwent surgery 2/25 with noted necortic loops of SB  He was intubated then extubate but required reintubation 3/2. He has had recurrent fevers to 101.  He was extubated this AM and is currently lethargic but able to arouse. He has NGT and PICC line in place and foley cath.   Ertapenem 2/26-3/4 Cipro 3/4- current    Past Medical History  Diagnosis Date  . History of kidney cancer   . Bladder cancer ([place])   . Pneumonia    Past Surgical History  Procedure Laterality Date  . Hernia repair    . Cholecystectomy    . Cystectomy w/ continent diversion  1996  . Prostatectomy  1996  . Tonsillectomy    . Colonoscopy  2000  . Laparotomy N/A 05/20/2015    Procedure: EXPLORATORY LAPAROTOMY;  Surgeon: Clayburn Pert, MD;  Location: [facility] ORS;  Service: General;  Laterality: N/A;  . Bowel resection  05/20/2015    Procedure: SMALL BOWEL RESECTION;  Surgeon: Clayburn Pert, MD;  Location: [facility] ORS;  Service: General;;   Social History  Substance Use Topics  . Smoking status: Current Every Day Smoker -- 2.00 packs/day for 57 years    Types: Cigarettes  . Smokeless tobacco: None     Comment: patient not ready to quit  . Alcohol Use: 8.4 oz/week    14 Standard drinks or equivalent per week   Family History  Problem Relation Age of Onset  . COPD Sister   . Heart disease Mother   . Heart disease Father     Allergies: No Known  Allergies  Current antibiotics: Antibiotics Given (last 72 hours)    Date/Time Action Medication Dose Rate   05/26/15 1852 Given   ertapenem (INVANZ) 1 g in sodium chloride 0.9 % 50 mL IVPB 1 g 100 mL/hr   05/27/15 1738 Given   ertapenem (INVANZ) 1 g in sodium chloride 0.9 % 50 mL IVPB 1 g 100 mL/hr   05/28/15 1510 Given   ciprofloxacin (CIPRO) IVPB 400 mg 400 mg 200 mL/hr   05/29/15 0338 Given   ciprofloxacin (CIPRO) IVPB 400 mg 400 mg 200 mL/hr      MEDICATIONS: . antiseptic oral rinse  7 mL Mouth Rinse q12n4p  . budesonide (PULMICORT) nebulizer solution  0.5 mg Nebulization BID  . chlorhexidine  15 mL Mouth Rinse BID  . ciprofloxacin  400 mg Intravenous Q12H  . [START ON 05/30/2015] diazepam  2.5 mg Intravenous QHS  . enoxaparin (LOVENOX) injection  40 mg Subcutaneous Q24H  . free water  200 mL Per Tube 3 times per day  . insulin aspart  2-6 Units Subcutaneous 6 times per day  . ipratropium-albuterol  3 mL Nebulization Q6H  . pantoprazole (PROTONIX) IV  40 mg Intravenous Q24H  . thiamine  100 mg Intravenous Daily    Review of Systems - 11 systems reviewed and negative per HPI   OBJECTIVE: Temp:  [99.9 F (37.7 C)-101.3 F (38.5  C)] 99.9 F (37.7 C) (03/06 0700) Pulse Rate:  [77-99] 95 (03/06 0700) Resp:  [21-35] 31 (03/06 0700) BP: (103-158)/(61-84) 152/77 mmHg (03/06 1204) SpO2:  [94 %-98 %] 95 % (03/06 1204) Weight:  [77.6 kg (171 lb 1.2 oz)] 77.6 kg (171 lb 1.2 oz) (03/06 0230) Physical Exam  Constitutional: groggy but arousable.  HENT: perrla,  Mouth/Throat: Oropharynx is clear and moist. No oropharyngeal exudate. NGT in place Cardiovascular: Normal rate, regular rhythm and normal heart sounds. Pulmonary/Chest: bil rhonchi  Abdominal: Soft. Bowel sounds are normal.  Mild diffuse TTP, midline incision with staples but very clean and no drainage. JP drain LLQ qith ss drainage He has no cervical adenopathy.  Neurological: He is lethargic EXT no edema, RLE with  cool to touch, some mottling  Skin: Skin is warm and dry. No rash noted. No erythema.  Psychiatric: lethargic  Access- Picc RUE, NGT, Foley      LABS: Results for orders placed or performed during the hospital encounter of 05/17/15 (from the past 48 hour(s))  Glucose, capillary     Status: Abnormal   Collection Time: 05/27/15  3:59 PM  Result Value Ref Range   Glucose-Capillary 136 (H) 65 - 99 mg/dL  Glucose, capillary     Status: Abnormal   Collection Time: 05/27/15  7:42 PM  Result Value Ref Range   Glucose-Capillary 134 (H) 65 - 99 mg/dL  Glucose, capillary     Status: Abnormal   Collection Time: 05/27/15 11:04 PM  Result Value Ref Range   Glucose-Capillary 154 (H) 65 - 99 mg/dL  Glucose, capillary     Status: Abnormal   Collection Time: 05/27/15 11:36 PM  Result Value Ref Range   Glucose-Capillary 147 (H) 65 - 99 mg/dL  Glucose, capillary     Status: Abnormal   Collection Time: 05/28/15  3:53 AM  Result Value Ref Range   Glucose-Capillary 160 (H) 65 - 99 mg/dL  Glucose, capillary     Status: Abnormal   Collection Time: 05/28/15  8:07 AM  Result Value Ref Range   Glucose-Capillary 147 (H) 65 - 99 mg/dL  Basic metabolic panel     Status: Abnormal   Collection Time: 05/28/15  9:18 AM  Result Value Ref Range   Sodium 138 135 - 145 mmol/L   Potassium 3.6 3.5 - 5.1 mmol/L   Chloride 102 101 - 111 mmol/L   CO2 31 22 - 32 mmol/L   Glucose, Bld 179 (H) 65 - 99 mg/dL   BUN 23 (H) 6 - 20 mg/dL   Creatinine, Ser 0.86 0.61 - 1.24 mg/dL   Calcium 7.4 (L) 8.9 - 10.3 mg/dL   GFR calc non Af Amer >60 >60 mL/min   GFR calc Af Amer >60 >60 mL/min    Comment: (NOTE) The eGFR has been calculated using the CKD EPI equation. This calculation has not been validated in all clinical situations. eGFR's persistently <60 mL/min signify possible Chronic Kidney Disease.    Anion gap 5 5 - 15  Magnesium     Status: None   Collection Time: 05/28/15  9:18 AM  Result Value Ref Range    Magnesium 2.2 1.7 - 2.4 mg/dL  Phosphorus     Status: None   Collection Time: 05/28/15  9:18 AM  Result Value Ref Range   Phosphorus 3.6 2.5 - 4.6 mg/dL  CBC     Status: Abnormal   Collection Time: 05/28/15  9:18 AM  Result Value Ref Range   WBC 6.1  3.8 - 10.6 K/uL   RBC 3.49 (L) 4.40 - 5.90 MIL/uL   Hemoglobin 11.1 (L) 13.0 - 18.0 g/dL   HCT 32.8 (L) 40.0 - 52.0 %   MCV 94.0 80.0 - 100.0 fL   MCH 31.7 26.0 - 34.0 pg   MCHC 33.8 32.0 - 36.0 g/dL   RDW 13.3 11.5 - 14.5 %   Platelets 206 150 - 440 K/uL  Glucose, capillary     Status: Abnormal   Collection Time: 05/28/15 11:16 AM  Result Value Ref Range   Glucose-Capillary 178 (H) 65 - 99 mg/dL  Blood gas, arterial     Status: Abnormal   Collection Time: 05/28/15 12:22 PM  Result Value Ref Range   FIO2 0.35    Delivery systems VENTILATOR    Mode CONTINUOUS POSITIVE AIRWAY PRESSURE    Peep/cpap 5.0 cm H20   Pressure support 5 cm H20   pH, Arterial 7.44 7.350 - 7.450   pCO2 arterial 47 32.0 - 48.0 mmHg   pO2, Arterial 78 (L) 83.0 - 108.0 mmHg   Bicarbonate 31.9 (H) 21.0 - 28.0 mEq/L   Acid-Base Excess 6.6 (H) 0.0 - 3.0 mmol/L   O2 Saturation 95.9 %   Patient temperature 37.0    Collection site RIGHT BRACHIAL    Sample type ARTERIAL DRAW    Allens test (pass/fail) POSITIVE (A) PASS  Glucose, capillary     Status: Abnormal   Collection Time: 05/28/15  3:39 PM  Result Value Ref Range   Glucose-Capillary 157 (H) 65 - 99 mg/dL  Glucose, capillary     Status: Abnormal   Collection Time: 05/28/15  7:55 PM  Result Value Ref Range   Glucose-Capillary 176 (H) 65 - 99 mg/dL  Glucose, capillary     Status: Abnormal   Collection Time: 05/28/15 11:43 PM  Result Value Ref Range   Glucose-Capillary 149 (H) 65 - 99 mg/dL  Glucose, capillary     Status: Abnormal   Collection Time: 05/29/15  3:36 AM  Result Value Ref Range   Glucose-Capillary 192 (H) 65 - 99 mg/dL  Glucose, capillary     Status: Abnormal   Collection Time: 05/29/15   7:25 AM  Result Value Ref Range   Glucose-Capillary 158 (H) 65 - 99 mg/dL  Basic metabolic panel     Status: Abnormal   Collection Time: 05/29/15  8:22 AM  Result Value Ref Range   Sodium 140 135 - 145 mmol/L   Potassium 3.4 (L) 3.5 - 5.1 mmol/L   Chloride 103 101 - 111 mmol/L   CO2 31 22 - 32 mmol/L   Glucose, Bld 192 (H) 65 - 99 mg/dL   BUN 20 6 - 20 mg/dL   Creatinine, Ser 0.82 0.61 - 1.24 mg/dL   Calcium 7.7 (L) 8.9 - 10.3 mg/dL   GFR calc non Af Amer >60 >60 mL/min   GFR calc Af Amer >60 >60 mL/min    Comment: (NOTE) The eGFR has been calculated using the CKD EPI equation. This calculation has not been validated in all clinical situations. eGFR's persistently <60 mL/min signify possible Chronic Kidney Disease.    Anion gap 6 5 - 15  Magnesium     Status: None   Collection Time: 05/29/15  8:22 AM  Result Value Ref Range   Magnesium 2.1 1.7 - 2.4 mg/dL  Phosphorus     Status: None   Collection Time: 05/29/15  8:22 AM  Result Value Ref Range   Phosphorus 3.1 2.5 -  4.6 mg/dL  Glucose, capillary     Status: Abnormal   Collection Time: 05/29/15 11:52 AM  Result Value Ref Range   Glucose-Capillary 205 (H) 65 - 99 mg/dL   No components found for: ESR, C REACTIVE PROTEIN MICRO: Recent Results (from the past 720 hour(s))  Urine culture     Status: None   Collection Time: 05/18/15  9:21 AM  Result Value Ref Range Status   Specimen Description URINE, CATHETERIZED  Final   Special Requests Normal  Final   Culture NO GROWTH 1 DAY  Final   Report Status 05/19/2015 FINAL  Final  MRSA PCR Screening     Status: None   Collection Time: 05/20/15  5:08 PM  Result Value Ref Range Status   MRSA by PCR NEGATIVE NEGATIVE Final    Comment:        The GeneXpert MRSA Assay (FDA approved for NASAL specimens only), is one component of a comprehensive MRSA colonization surveillance program. It is not intended to diagnose MRSA infection nor to guide or monitor treatment for MRSA  infections.   Culture, respiratory (NON-Expectorated)     Status: None   Collection Time: 05/22/15 10:29 AM  Result Value Ref Range Status   Specimen Description TRACHEAL ASPIRATE  Final   Special Requests NONE  Final   Gram Stain   Final    FEW WBC SEEN RARE SQUAMOUS EPITHELIAL CELLS PRESENT NO ORGANISMS SEEN    Culture LIGHT GROWTH CITROBACTER BRAAKII  Final   Report Status 05/25/2015 FINAL  Final   Organism ID, Bacteria CITROBACTER BRAAKII  Final      Susceptibility   Citrobacter braakii - MIC*    CEFAZOLIN >=64 RESISTANT Resistant     CEFEPIME <=1 SENSITIVE Sensitive     CEFTAZIDIME <=1 SENSITIVE Sensitive     CEFTRIAXONE <=1 SENSITIVE Sensitive     CIPROFLOXACIN <=0.25 SENSITIVE Sensitive     GENTAMICIN <=1 SENSITIVE Sensitive     IMIPENEM 0.5 SENSITIVE Sensitive     TRIMETH/SULFA <=20 SENSITIVE Sensitive     PIP/TAZO <=4 SENSITIVE Sensitive     * LIGHT GROWTH CITROBACTER BRAAKII  CULTURE, BLOOD (ROUTINE X 2) w Reflex to PCR ID Panel     Status: None   Collection Time: 05/22/15 10:37 AM  Result Value Ref Range Status   Specimen Description BLOOD LT [facility]  Final   Special Requests BOTTLES DRAWN AEROBIC AND ANAEROBIC 7ML ANA 7MLAER  Final   Culture NO GROWTH 5 DAYS  Final   Report Status 05/27/2015 FINAL  Final  CULTURE, BLOOD (ROUTINE X 2) w Reflex to PCR ID Panel     Status: None   Collection Time: 05/22/15 10:50 AM  Result Value Ref Range Status   Specimen Description BLOOD RT AC  Final   Special Requests   Final    BOTTLES DRAWN AEROBIC AND ANAEROBIC 7ML ANA 7ML AER   Culture NO GROWTH 5 DAYS  Final   Report Status 05/27/2015 FINAL  Final  Urine culture     Status: None   Collection Time: 05/22/15 11:47 AM  Result Value Ref Range Status   Specimen Description URINE, RANDOM  Final   Special Requests NONE  Final   Culture NO GROWTH 1 DAY  Final   Report Status 05/23/2015 FINAL  Final  CULTURE, BLOOD (ROUTINE X 2) w Reflex to PCR ID Panel     Status: None  (Preliminary result)   Collection Time: 05/27/15  1:26 PM  Result Value Ref Range  Status   Specimen Description BLOOD RIGHT HAND  Final   Special Requests BOTTLES DRAWN AEROBIC AND ANAEROBIC 3CC  Final   Culture NO GROWTH 2 DAYS  Final   Report Status PENDING  Incomplete  CULTURE, BLOOD (ROUTINE X 2) w Reflex to PCR ID Panel     Status: None (Preliminary result)   Collection Time: 05/27/15  1:33 PM  Result Value Ref Range Status   Specimen Description BLOOD LEFT HAND  Final   Special Requests BOTTLES DRAWN AEROBIC AND ANAEROBIC 5CC  Final   Culture NO GROWTH 2 DAYS  Final   Report Status PENDING  Incomplete    IMAGING: Dg Chest 1 View  05/22/2015  CLINICAL DATA:  Line placement. EXAM: CHEST 1 VIEW COMPARISON:  Earlier today. FINDINGS: 1742 hours. Endotracheal tube terminates 4.7 cm above carina.Nasogastric tube terminates at the gastric body. A right-sided PICC line is new, and terminates at the low SVC versus cavoatrial junction. Midline trachea. Normal heart size for level of inspiration. No pleural effusion or pneumothorax. Low lung volumes with improved bibasilar airspace disease. IMPRESSION: Right-sided PICC line terminates at the low SVC versus superior caval/atrial junction. No pneumothorax. Improved bibasilar aeration, with mild atelectasis remaining. Electronically Signed   By: Abigail Miyamoto M.D.   On: 05/22/2015 17:59   Ct Abdomen Pelvis W Contrast  05/27/2015  CLINICAL DATA:  One week postop from bowel resection for bowel obstruction. Fever. Bladder carcinoma. EXAM: CT ABDOMEN AND PELVIS WITH CONTRAST TECHNIQUE: Multidetector CT imaging of the abdomen and pelvis was performed using the standard protocol following bolus administration of intravenous contrast. CONTRAST:  17m OMNIPAQUE IOHEXOL 350 MG/ML SOLN, 1 OMNIPAQUE IOHEXOL 240 MG/ML SOLN COMPARISON:  05/17/2015 FINDINGS: Lower chest: New small bilateral pleural effusions and bibasilar atelectasis demonstrated. Hepatobiliary: No  masses or other significant abnormality. Prior cholecystectomy noted. No evidence of biliary dilatation. Pancreas: No mass, inflammatory changes, or other significant abnormality. Spleen: Within normal limits in size and appearance. Adrenals/Urinary Tract: No evidence of adrenal or renal masses. Tiny sub-cm right renal cyst remains stable. No evidence of hydronephrosis. Patient has undergone cystectomy with ileal loop diversion into pelvic neobladder. A Foley catheter is seen within the neobladder which is decompressed. Stomach/Bowel: Recent postsurgical changes are seen from repair of large ventral abdominal wall hernia. Mild wall thickening of the transverse colon is seen near the site of the hernia repair likely due to postop edema. No evidence of obstruction. There is no evidence of recurrent hernia. No evidence of abscess or free air. Vascular/Lymphatic: No pathologically enlarged lymph nodes. 3.0 cm infrarenal abdominal aortic aneurysm remains stable. No evidence of aneurysm leak or rupture. Reproductive: No mass or other significant abnormality. Other: None. Musculoskeletal:  No suspicious bone lesions identified. IMPRESSION: Expected postoperative changes from repair recent ventral abdominal wall hernia. Low residual transverse colonic wall thickening near the hernia site is likely due to postop edema. No evidence of bowel obstruction or abscess. New small bilateral pleural effusions and bibasilar atelectasis. Stable postop changes from previous cystectomy and ileal loop diversion into pelvic neobladder. No evidence of hydronephrosis. No evidence of recurrent or metastatic carcinoma. Stable 3.0 cm infrarenal abdominal aortic aneurysm. Recommend followup by ultrasound in 3 years. This recommendation follows ACR consensus guidelines: White Paper of the ACR Incidental Findings Committee II on Vascular Findings. JNatasha MeadColl Radiol 2013; 10:789-794 Electronically Signed   By: JEarle GellM.D.   On: 05/27/2015 16:07    Ct Abdomen Pelvis W Contrast  05/17/2015  CLINICAL DATA:  Generalized abdominal and chest pain since this afternoon. History bladder cancer. EXAM: CT ABDOMEN AND PELVIS WITH CONTRAST TECHNIQUE: Multidetector CT imaging of the abdomen and pelvis was performed using the standard protocol following bolus administration of intravenous contrast. CONTRAST:  165m OMNIPAQUE IOHEXOL 300 MG/ML  SOLN COMPARISON:  07/27/2013.  04/12/2011 FINDINGS: Lower chest: Stable appearance of chronic right pleural thickening posteriorly. Hepatobiliary: No focal abnormality within the liver parenchyma. Gallbladder surgically absent. No intrahepatic or extrahepatic biliary dilation. Pancreas: No focal mass lesion. No dilatation of the main duct. No intraparenchymal cyst. No peripancreatic edema. Spleen: No splenomegaly. No focal mass lesion. Adrenals/Urinary Tract: Stable tiny bilateral adrenal nodules consistent with adenomas. Cortical scarring noted in the right kidney. No enhancing lesion in either kidney. Stable small right renal cysts. Left kidney is unremarkable. Mild right hydronephrosis is associated with right hydroureter with the right ureter reimplanted along the left anterior bladder wall. On previous exams, at this reimplanted ureter, potentially a conduit, had thin walls but demonstrates a thick enhancing wall on today's study. There is some mild wall thickening in the right aspect of the neobladder. Left ureter also appears to tiny into the conduit. Stomach/Bowel: Stomach is mildly distended. Duodenum is normally positioned as is the ligament of Treitz. Multiple small bowel loops extend through a complex ventral hernia that also contains transverse colon. There is a dilated small bowel loop measuring up to 3.8 cm that tracks into the hernia with decompressed, non-opacified small bowel exiting the hernia sac. The terminal ileum is normal. The appendix is not visualized, but there is no edema or inflammation in the region  of the cecum. Transverse colon extends into the hernia sac without proximal colonic dilatation. Diverticular change noted in the left colon without diverticulitis. Vascular/Lymphatic: Abdominal aorta measures up to 3.0 cm and diameter, consistent with aneurysm. Atherosclerotic calcification is associated in the abdominal aortic wall. There is no gastrohepatic or hepatoduodenal ligament lymphadenopathy. No intraperitoneal or retroperitoneal lymphadenopathy. No pelvic sidewall lymphadenopathy. Reproductive: Prostate gland appears to be surgically absent. Other: No intraperitoneal free fluid. Musculoskeletal: Bone windows reveal no worrisome lytic or sclerotic osseous lesions. Bilateral pars interarticularis defects are seen at L5. IMPRESSION: Large complex ventral hernia as seen on multiple previous imaging studies. Small bowel and colon is contained within the hernia sac and on today's study, and there is a distended contrast filled small bowel loop that appears to track into the hernia where multiple dilated small bowel loops are present. Small bowel leaving the hernia sac is decompressed and non-opacified. As such, imaging features are suspicious for incarcerated small bowel with obstruction related to the ventral hernia. Patient appears to be status post cystectomy with urinary diversion to a neobladder. There is some wall thickening in the urinary conduit as it ties into the neobladder, but this is nonspecific. On previous studies, the wall to this structure was very thin and the wall thickening today makes inflammation a concern. There is also some new wall thickening associated with the right aspect of the neobladder. Stable tiny bilateral adrenal nodules, likely representing adenomas. Abdominal aortic atherosclerosis with 3.0 cm aneurysm, stable. Electronically Signed   By: EMisty StanleyM.D.   On: 05/17/2015 23:32   Dg Chest Port 1 View  05/28/2015  CLINICAL DATA:  Acute respiratory failure, history bladder  cancer, renal cancer, pneumonia, smoking EXAM: PORTABLE CHEST 1 VIEW COMPARISON:  Portable exam 0608 hours compared to 05/25/2015 FINDINGS: Tip of endotracheal tube projects 1.8 cm above carina. Nasogastric tube coiled in proximal stomach.  RIGHT arm PICC line tip projects over SVC. Upper normal heart size. Stable mediastinal contours and pulmonary vascularity. Perihilar to basilar infiltrates again identified question mild pulmonary edema. Bibasilar atelectasis. Cannot completely exclude small RIGHT pleural effusion. No pneumothorax. IMPRESSION: Question mild pulmonary edema with coexistent bibasilar atelectasis. Electronically Signed   By: Lavonia Dana M.D.   On: 05/28/2015 08:06   Portable Chest Xray  05/25/2015  CLINICAL DATA:  Hypoxia EXAM: PORTABLE CHEST 1 VIEW COMPARISON:  May 24, 2015 FINDINGS: Endotracheal tube tip is 2.5 cm above the carina. Central catheter tip is in the superior vena cava. Nasogastric tube tip and side port are in the stomach. No pneumothorax. There are small pleural effusions bilaterally with patchy bibasilar atelectasis. There is slight interstitial edema in the bases. Heart is borderline prominent with pulmonary vascularity within normal limits. No adenopathy evident. IMPRESSION: Tube and catheter positions as described without pneumothorax. Suspect a degree of congestive heart failure. There is patchy bibasilar atelectasis as well. No airspace consolidation evident. Electronically Signed   By: Lowella Grip III M.D.   On: 05/25/2015 09:14   Dg Chest Port 1 View  05/24/2015  CLINICAL DATA:  Acute respiratory failure EXAM: PORTABLE CHEST 1 VIEW COMPARISON:  05/22/2015 FINDINGS: Right-sided PICC line, endotracheal tube and nasogastric catheter are all again seen and stable in appearance. Cardiac shadow is within normal limits. Increasing bibasilar atelectasis is noted. No bony abnormality is seen. IMPRESSION: Increasing bibasilar atelectasis. Electronically Signed   By: Inez Catalina M.D.   On: 05/24/2015 07:42   Dg Chest Port 1 View  05/22/2015  CLINICAL DATA:  Respiratory failure EXAM: PORTABLE CHEST 1 VIEW COMPARISON:  05/21/2015 FINDINGS: Endotracheal tube and NG tube are unchanged. Stable cardiac silhouette. There is increase in bibasilar opacities. Upper lungs are clear. IMPRESSION: 1. Stable support apparatus. 2. Increased bibasilar linear opacities representing atelectasis versus infiltrate. Electronically Signed   By: Suzy Bouchard M.D.   On: 05/22/2015 07:28   Dg Chest Port 1 View  05/21/2015  CLINICAL DATA:  Intubated, smoker EXAM: PORTABLE CHEST 1 VIEW COMPARISON:  05/20/2015 FINDINGS: Endotracheal tube is 5 cm above the carina. NG tube is in the stomach. The heart is borderline in size. Bibasilar atelectasis or infiltrates, right greater than left. Possible small right effusion. IMPRESSION: Bibasilar atelectasis or infiltrates, right greater than left. Small right effusion. Electronically Signed   By: Rolm Baptise M.D.   On: 05/21/2015 08:13   Dg Chest Port 1 View  05/20/2015  CLINICAL DATA:  Acute onset of cough.  Initial encounter. EXAM: PORTABLE CHEST 1 VIEW COMPARISON:  Chest radiograph from 07/14/2014 FINDINGS: The lungs are well-aerated. Peribronchial thickening is noted. Mild bibasilar opacities may reflect mild interstitial edema or possibly pneumonia. There is no evidence of pleural effusion or pneumothorax. The cardiomediastinal silhouette is within normal limits. No acute osseous abnormalities are seen. IMPRESSION: Peribronchial thickening noted. Mild bibasilar airspace opacities may reflect mild interstitial edema or possibly pneumonia. Electronically Signed   By: Garald Balding M.D.   On: 05/20/2015 05:00   Dg Abd 2 Views  05/20/2015  CLINICAL DATA:  70 year old male -followup small bowel obstruction. EXAM: ABDOMEN - 2 VIEW COMPARISON:  05/19/2015 and prior radiographs.  05/17/2015 CT FINDINGS: Dilated small bowel loops within the abdomen again  noted. Gas and stool in the colon again noted. Cholecystectomy clips again identified. IMPRESSION: Little significant change in dilated small bowel loops. Electronically Signed   By: Margarette Canada M.D.   On: 05/20/2015 09:59  Dg Abd 2 Views  05/19/2015  CLINICAL DATA:  Ventral hernia with obstruction EXAM: ABDOMEN - 2 VIEW COMPARISON:  05/18/2015 FINDINGS: NG tube has been removed. Again noted gaseous distended small bowel loops mid abdomen suspicious for ileus or partial bowel obstruction. Postcholecystectomy surgical clips. Moderate stool and gas noted in right colon and splenic flexure of the colon. Surgical clips are noted within pelvis. No evidence of free abdominal air. IMPRESSION: Again noted gaseous distended small bowel loops mid abdomen suspicious for ileus or partial bowel obstruction. Postcholecystectomy surgical clips. Moderate stool and gas noted in right colon and splenic flexure of the colon. Electronically Signed   By: Lahoma Crocker M.D.   On: 05/19/2015 08:18   Dg Abd Portable 2v  05/18/2015  CLINICAL DATA:  Patient with generalized abdominal pain. Abdominal discomfort. EXAM: PORTABLE ABDOMEN - 2 VIEW COMPARISON:  CT abdomen pelvis 05/17/2015 FINDINGS: Enteric tube tip and side-port project over the stomach. Cholecystectomy clips. Lung bases are clear. There multiple prominent gaseous distended loops of small bowel within the central abdomen measuring up to 4.3 cm. Stool and gas demonstrated within the colon. Pelvic surgical clips. Lumbar spine degenerative changes. IMPRESSION: Enteric tube tip and side-port project over the stomach. Multiple gaseous distended loops of small bowel within the central abdomen concerning for early and/or partial bowel obstruction. Electronically Signed   By: Lovey Newcomer M.D.   On: 05/18/2015 11:19    Assessment:   Brady Smith is a 70 y.o. male with complicated post op course after incarcerated ventral hernia, with persistent fevers but nml wbc. Only + cx is  sputum with citrobacter. S/p 7 days ertapenem and now on cipro. CT abd neg for abscess. Some concern of ETOH and wd but is well into hospitalization  Possible sources include abd, PNA, sinusitis with NGT in place, picc line infection.   Recommendations Continue cipro for another 2 days and monitor cultures If improving stop cipro on 3/8 and follow off abx.  Monitor for increasing fevers or wbc off abx Thank you very much for allowing me to participate in the care of this patient. Please call with questions.   Cheral Marker. Ola Spurr, MD

## 2015-05-29 NOTE — Progress Notes (Signed)
Patient ID: Brady Smith, male   DOB: 1946-03-05, 70 y.o.   MRN: [id number] [facility] PROGRESS NOTE  Brady Smith [id number] DOB: November 22, 1945 DOA: 05/17/2015 PCP: Halina Maidens, MD  HPI/Subjective: Patient is awake. He is difficult to understand. He is trying to talk but is mumbling. He shakes his head yes or no to some yes or no questions  Objective: Filed Vitals:   05/29/15 0700 05/29/15 1204  BP: 158/73 152/77  Pulse: 95   Temp: 99.9 F (37.7 C)   Resp: 31     Filed Weights   05/27/15 0700 05/28/15 0500 05/29/15 0230  Weight: 81.1 kg (178 lb 12.7 oz) 79.4 kg (175 lb 0.7 oz) 77.6 kg (171 lb 1.2 oz)    ROS: Review of Systems  Unable to perform ROS Respiratory: Positive for shortness of breath. Negative for cough.   Cardiovascular: Negative for chest pain.  Gastrointestinal: Negative for abdominal pain.  Neurological: Negative for headaches.   Limited secondary to altered mental status Exam: Physical Exam  HENT:  Nose: No mucosal edema.  Mouth/Throat: No oropharyngeal exudate or posterior oropharyngeal edema.  Eyes: Conjunctivae and lids are normal. Pupils are equal, round, and reactive to light.  Neck: No JVD present. Carotid bruit is not present. No edema present. No thyroid mass and no thyromegaly present.  Cardiovascular: S1 normal, S2 normal and normal heart sounds.  Exam reveals no gallop.   No murmur heard. Pulses:      Dorsalis pedis pulses are 0 on the right side, and 1+ on the left side.  Respiratory: No respiratory distress. He has no wheezes. He has rhonchi in the right lower field and the left lower field. He has no rales.  GI: Soft. He exhibits distension. Bowel sounds are decreased. There is no tenderness.  Musculoskeletal:       Right ankle: He exhibits no swelling.       Left ankle: He exhibits no swelling.  Lymphadenopathy:    He has no cervical adenopathy.  Neurological: He is alert.  Able to lift up arms up off the bed  Skin:  Skin is warm. Nails show no clubbing.  Right foot cool to touch  Psychiatric: His speech is slurred. He is slowed.      Data Reviewed: Basic Metabolic Panel:  Recent Labs Lab 05/25/15 0411 05/26/15 0455 05/27/15 0331 05/28/15 0918 05/29/15 0822  NA 142 141 141 138 140  K 3.4* 4.0 3.7 3.6 3.4*  CL 106 106 106 102 103  CO2 32 30 32 31 31  GLUCOSE 167* 145* 151* 179* 192*  BUN 22* 23* 24* 23* 20  CREATININE 0.75 0.88 0.80 0.86 0.82  CALCIUM 7.3* 7.3* 7.4* 7.4* 7.7*  MG 2.2 2.2 2.1 2.2 2.1  PHOS 2.4* 2.9 3.5 3.6 3.1   Liver Function Tests:  Recent Labs Lab 05/23/15 0614  AST 47*  ALT 55  ALKPHOS 37*  BILITOT 0.9  PROT 4.9*  ALBUMIN 1.8*   CBC:  Recent Labs Lab 05/23/15 0614 05/24/15 0455 05/25/15 0411 05/27/15 1247 05/28/15 0918  WBC 6.8 5.9 6.4 6.8 6.1  NEUTROABS  --   --   --  6.0  --   HGB 12.7* 12.5* 12.0* 12.0* 11.1*  HCT 38.5* 37.8* 35.5* 36.3* 32.8*  MCV 97.0 98.3 94.4 97.6 94.0  PLT 104* 96* 110* 195 206    CBG:  Recent Labs Lab 05/28/15 1955 05/28/15 2343 05/29/15 0336 05/29/15 0725 05/29/15 1152  GLUCAP 176* 149* 192* 158* 205*  Recent Results (from the past 240 hour(s))  MRSA PCR Screening     Status: None   Collection Time: 05/20/15  5:08 PM  Result Value Ref Range Status   MRSA by PCR NEGATIVE NEGATIVE Final    Comment:        The GeneXpert MRSA Assay (FDA approved for NASAL specimens only), is one component of a comprehensive MRSA colonization surveillance program. It is not intended to diagnose MRSA infection nor to guide or monitor treatment for MRSA infections.   Culture, respiratory (NON-Expectorated)     Status: None   Collection Time: 05/22/15 10:29 AM  Result Value Ref Range Status   Specimen Description TRACHEAL ASPIRATE  Final   Special Requests NONE  Final   Gram Stain   Final    FEW WBC SEEN RARE SQUAMOUS EPITHELIAL CELLS PRESENT NO ORGANISMS SEEN    Culture LIGHT GROWTH CITROBACTER BRAAKII  Final    Report Status 05/25/2015 FINAL  Final   Organism ID, Bacteria CITROBACTER BRAAKII  Final      Susceptibility   Citrobacter braakii - MIC*    CEFAZOLIN >=64 RESISTANT Resistant     CEFEPIME <=1 SENSITIVE Sensitive     CEFTAZIDIME <=1 SENSITIVE Sensitive     CEFTRIAXONE <=1 SENSITIVE Sensitive     CIPROFLOXACIN <=0.25 SENSITIVE Sensitive     GENTAMICIN <=1 SENSITIVE Sensitive     IMIPENEM 0.5 SENSITIVE Sensitive     TRIMETH/SULFA <=20 SENSITIVE Sensitive     PIP/TAZO <=4 SENSITIVE Sensitive     * LIGHT GROWTH CITROBACTER BRAAKII  CULTURE, BLOOD (ROUTINE X 2) w Reflex to PCR ID Panel     Status: None   Collection Time: 05/22/15 10:37 AM  Result Value Ref Range Status   Specimen Description BLOOD LT [facility]  Final   Special Requests BOTTLES DRAWN AEROBIC AND ANAEROBIC 7ML ANA 7MLAER  Final   Culture NO GROWTH 5 DAYS  Final   Report Status 05/27/2015 FINAL  Final  CULTURE, BLOOD (ROUTINE X 2) w Reflex to PCR ID Panel     Status: None   Collection Time: 05/22/15 10:50 AM  Result Value Ref Range Status   Specimen Description BLOOD RT AC  Final   Special Requests   Final    BOTTLES DRAWN AEROBIC AND ANAEROBIC 7ML ANA 7ML AER   Culture NO GROWTH 5 DAYS  Final   Report Status 05/27/2015 FINAL  Final  Urine culture     Status: None   Collection Time: 05/22/15 11:47 AM  Result Value Ref Range Status   Specimen Description URINE, RANDOM  Final   Special Requests NONE  Final   Culture NO GROWTH 1 DAY  Final   Report Status 05/23/2015 FINAL  Final  CULTURE, BLOOD (ROUTINE X 2) w Reflex to PCR ID Panel     Status: None (Preliminary result)   Collection Time: 05/27/15  1:26 PM  Result Value Ref Range Status   Specimen Description BLOOD RIGHT HAND  Final   Special Requests BOTTLES DRAWN AEROBIC AND ANAEROBIC 3CC  Final   Culture NO GROWTH 2 DAYS  Final   Report Status PENDING  Incomplete  CULTURE, BLOOD (ROUTINE X 2) w Reflex to PCR ID Panel     Status: None (Preliminary result)   Collection  Time: 05/27/15  1:33 PM  Result Value Ref Range Status   Specimen Description BLOOD LEFT HAND  Final   Special Requests BOTTLES DRAWN AEROBIC AND ANAEROBIC 5CC  Final   Culture NO GROWTH  2 DAYS  Final   Report Status PENDING  Incomplete     Scheduled Meds: . antiseptic oral rinse  7 mL Mouth Rinse q12n4p  . budesonide (PULMICORT) nebulizer solution  0.5 mg Nebulization BID  . chlorhexidine  15 mL Mouth Rinse BID  . ciprofloxacin  400 mg Intravenous Q12H  . diazepam  2.5 mg Intravenous Q12H  . enoxaparin (LOVENOX) injection  40 mg Subcutaneous Q24H  . free water  30 mL Per Tube 3 times per day  . insulin aspart  2-6 Units Subcutaneous 6 times per day  . ipratropium-albuterol  3 mL Nebulization Q6H  . pantoprazole (PROTONIX) IV  40 mg Intravenous Q24H  . thiamine  100 mg Intravenous Daily   Continuous Infusions: . Marland KitchenTPN (CLINIMIX-E) Adult 83 mL/hr at 05/29/15 0700  . Marland KitchenTPN (CLINIMIX-E) Adult    . dexmedetomidine Stopped (05/28/15 1400)  . feeding supplement (VITAL 1.5 CAL)      Assessment/Plan:  1. Persistent fever. Fever curve trending better. His last dose of IV Invanz was on Saturday. This could be a drug fever. I did switch to IV Cipro yesterday secondary to Citrobacter in the sputum. Repeat chest x-ray negative. Repeat blood cultures negative. CT scan of the abdomen and pelvis negative. Awaiting ID consult on whether to even continue antibiotics. 2. Incarcerated ventral hernia. Status post removal of necrotic bowel and incarcerated hernia repair. Patient currently on TPN and low-dose tube feeding 3. Alcohol withdrawal. Patient should be through withdrawal process by now. Decrease the standing dose IV Valium to 2.5 mg IV daily at bedtime 4. Acute respiratory failure with hypoxia. Patient was reintubated 05/24/2015 secondary to encephalopathy. Patient extubated 05/28/2015. Currently breathing comfortably on nasal cannula 5. Atrial fibrillation. Rate controlled. Hold anticoagulation  at this point. 6. History of COPD on  Pulmicort nebulizers 7. Hypernatremia improved with D5W 8. Decreased pulse right lower extremity. Vascular surgery consultation appreciated. Likely peripheral vascular disease. 9. Acute encephalopathy. Likely secondary to alcohol withdrawal. Still waiting for mental status to improve. If no improvement may consider a CT scan of the head.  Code Status:     Code Status Orders        Start     Ordered   05/18/15 0352  Full code   Continuous     05/18/15 0351    Code Status History    Date Active Date Inactive Code Status Order ID Comments User Context   This patient has a current code status but no historical code status.    Advance Directive Documentation        Most Recent Value   Type of Advance Directive  Healthcare Power of Attorney   Pre-existing out of facility DNR order (yellow form or pink MOST form)     "MOST" Form in Place?       Disposition Plan: To be determined  Consultants:  Critical care specialist  Surgery  Vascular surgery  Infectious disease  Antibiotics:  Changed antibiotics to Cipro  Time spent: 24 minutes  Loletha Grayer  [facility]

## 2015-05-29 NOTE — Care Management (Signed)
Barriers to discharge: Extubated within last 24 hours.  Very drowsy, vascular consult for nonpalpable pulse  right pedal- asymptomatic of this, TPN. Temp 100.2. Paged attending to discuss the possibility of ltac

## 2015-05-29 NOTE — Progress Notes (Addendum)
Nutrition Follow-up   INTERVENTION:   Coordination of Care: Pt tolerating trophic feeds enterally via NG tube. Spoke with SLP who reports pt unable to advance diet at this time. MD Kasa aware that consult per surgical team ok with weaning off TPN and increasing EN at this time. Pt now extubated from vent. MD Loflin agreeable to weaning off TPN and going to goal on TF. Now recommending Vital 1.5 at 54mL/hr goal rate to provide 1980kcals, 90g protein and 101mL of free water with additional flushes of 262mL TID, totaling 1645mL of free water. Will recommend starting Vital 1.5 at 8mL/hr at this time and increasing to goal rate as tolerated. Will decrease TPN by half, 5%AA/20%Dextrose at 13mL/hr for the remainder of the order; TPN will discontinue at 17:59 this evening.  NUTRITION DIAGNOSIS:   Inadequate oral intake related to acute illness, altered GI function as evidenced by NPO status.  GOAL:   Provide needs based on ASPEN/SCCM guidelines   MONITOR:    (PN, Energy Intake, Anthropometrics, Electrolyte/Renal Profile, Glucose Profile)  REASON FOR ASSESSMENT:   Ventilator, Consult New TPN/TNA  ASSESSMENT:    Pt extubated yesterday. Tolerating trophic feeds via NG tube currently. SLP consult pending for possible swallowing evaluation.  Diet Order:  .TPN (CLINIMIX-E) Adult .TPN (CLINIMIX-E) Adult    Current Nutrition: Pt tolerating Vital High Protein at 38mL/hr with free water of 36mL q 8 hours currently.    Gastrointestinal Profile: per Nsg documentation, distended abdomen, hypoactive BS, tender abdomen Last BM: 05/29/2015 soft brown small stool   Scheduled Medications:  . antiseptic oral rinse  7 mL Mouth Rinse q12n4p  . budesonide (PULMICORT) nebulizer solution  0.5 mg Nebulization BID  . chlorhexidine  15 mL Mouth Rinse BID  . ciprofloxacin  400 mg Intravenous Q12H  . [START ON 05/30/2015] diazepam  2.5 mg Intravenous QHS  . enoxaparin (LOVENOX) injection  40 mg Subcutaneous  Q24H  . free water  30 mL Per Tube 3 times per day  . insulin aspart  2-6 Units Subcutaneous 6 times per day  . ipratropium-albuterol  3 mL Nebulization Q6H  . pantoprazole (PROTONIX) IV  40 mg Intravenous Q24H  . thiamine  100 mg Intravenous Daily    Continuous Medications:  . Marland KitchenTPN (CLINIMIX-E) Adult 83 mL/hr at 05/29/15 0700  . Marland KitchenTPN (CLINIMIX-E) Adult    . feeding supplement (VITAL 1.5 CAL)       Electrolyte/Renal Profile and Glucose Profile:   Recent Labs Lab 05/27/15 0331 05/28/15 0918 05/29/15 0822  NA 141 138 140  K 3.7 3.6 3.4*  CL 106 102 103  CO2 32 31 31  BUN 24* 23* 20  CREATININE 0.80 0.86 0.82  CALCIUM 7.4* 7.4* 7.7*  MG 2.1 2.2 2.1  PHOS 3.5 3.6 3.1  GLUCOSE 151* 179* 192*   Protein Profile:   Recent Labs Lab 05/23/15 0614  ALBUMIN 1.8*    Weight Trend since Admission: Filed Weights   05/27/15 0700 05/28/15 0500 05/29/15 0230  Weight: 178 lb 12.7 oz (81.1 kg) 175 lb 0.7 oz (79.4 kg) 171 lb 1.2 oz (77.6 kg)    BMI:  Body mass index is 26.02 kg/(m^2).  Estimated Nutritional Needs:   Kcal:  BEE: 1510kcals, TEE: (IF 1.1-1.3)(AF 1.2) 1993-2356kcals  Protein:  93-116g protein (1.2-1.5g/kg)  Fluid:  1925-2310 mL (25-30 ml/kg)    HIGH Care Level  Dwyane Luo, RD, LDN Pager [phone] Weekend/On-Call Pager [phone]

## 2015-05-30 ENCOUNTER — Inpatient Hospital Stay: Payer: Medicare PPO

## 2015-05-30 LAB — GLUCOSE, CAPILLARY
GLUCOSE-CAPILLARY: 130 mg/dL — AB (ref 65–99)
GLUCOSE-CAPILLARY: 131 mg/dL — AB (ref 65–99)
GLUCOSE-CAPILLARY: 131 mg/dL — AB (ref 65–99)
GLUCOSE-CAPILLARY: 148 mg/dL — AB (ref 65–99)
GLUCOSE-CAPILLARY: 182 mg/dL — AB (ref 65–99)
GLUCOSE-CAPILLARY: 71 mg/dL (ref 65–99)
Glucose-Capillary: 136 mg/dL — ABNORMAL HIGH (ref 65–99)

## 2015-05-30 LAB — BASIC METABOLIC PANEL
Anion gap: 7 (ref 5–15)
BUN: 21 mg/dL — ABNORMAL HIGH (ref 6–20)
CO2: 30 mmol/L (ref 22–32)
Calcium: 8 mg/dL — ABNORMAL LOW (ref 8.9–10.3)
Chloride: 106 mmol/L (ref 101–111)
Creatinine, Ser: 0.75 mg/dL (ref 0.61–1.24)
GFR calc Af Amer: >60 mL/min (ref >60)
GLUCOSE: 103 mg/dL — AB (ref 65–99)
Potassium: 3.6 mmol/L (ref 3.5–5.1)
Sodium: 143 mmol/L (ref 135–145)

## 2015-05-30 LAB — PHOSPHORUS: PHOSPHORUS: 5.3 mg/dL — AB (ref 2.5–4.6)

## 2015-05-30 LAB — MAGNESIUM: Magnesium: 2.2 mg/dL (ref 1.7–2.4)

## 2015-05-30 MED ORDER — ENOXAPARIN SODIUM 40 MG/0.4ML ~~LOC~~ SOLN
40.0000 mg | SUBCUTANEOUS | Status: DC
  Administered 2015-05-31 – 2015-06-08 (×9): 40 mg via SUBCUTANEOUS
  Filled 2015-05-30 (×9): qty 0.4

## 2015-05-30 MED ORDER — DIAZEPAM 2 MG PO TABS
2.0000 mg | ORAL_TABLET | Freq: Every evening | ORAL | Status: DC | PRN
  Administered 2015-05-30 – 2015-05-31 (×2): 2 mg via ORAL
  Filled 2015-05-30 (×3): qty 1

## 2015-05-30 MED ORDER — PANTOPRAZOLE SODIUM 40 MG PO PACK
40.0000 mg | PACK | Freq: Every day | ORAL | Status: DC
  Administered 2015-05-30 – 2015-05-31 (×2): 40 mg
  Filled 2015-05-30 (×3): qty 20

## 2015-05-30 MED ORDER — VITAMIN B-1 100 MG PO TABS: 100.0000 mg | ORAL_TABLET | Freq: Every day | ORAL | Status: DC

## 2015-05-30 MED ORDER — INSULIN ASPART 100 UNIT/ML ~~LOC~~ SOLN: 2.0000 [IU] | Freq: Four times a day (QID) | SUBCUTANEOUS | Status: DC

## 2015-05-30 MED ORDER — INSULIN ASPART 100 UNIT/ML ~~LOC~~ SOLN
1.0000 [IU] | SUBCUTANEOUS | Status: DC
  Administered 2015-05-30: 2 [IU] via SUBCUTANEOUS
  Administered 2015-05-30 – 2015-05-31 (×3): 1 [IU] via SUBCUTANEOUS
  Administered 2015-05-31: 2 [IU] via SUBCUTANEOUS
  Administered 2015-05-31: 1 [IU] via SUBCUTANEOUS
  Administered 2015-06-01: 2 [IU] via SUBCUTANEOUS
  Administered 2015-06-01 – 2015-06-03 (×3): 1 [IU] via SUBCUTANEOUS
  Filled 2015-05-30: qty 1
  Filled 2015-05-30: qty 2
  Filled 2015-05-30 (×2): qty 1
  Filled 2015-05-30: qty 2
  Filled 2015-05-30: qty 1
  Filled 2015-05-30: qty 2
  Filled 2015-05-30: qty 1
  Filled 2015-05-30: qty 2
  Filled 2015-05-30: qty 1
  Filled 2015-05-30: qty 2

## 2015-05-30 MED ORDER — DIAZEPAM 5 MG/ML PO CONC: 2.0000 mg | Freq: Every evening | ORAL | Status: DC | PRN

## 2015-05-30 MED ORDER — VITAMIN B-1 100 MG PO TABS
100.0000 mg | ORAL_TABLET | Freq: Every day | ORAL | Status: DC
  Administered 2015-05-30 – 2015-05-31 (×2): 100 mg via ORAL
  Filled 2015-05-30 (×2): qty 1

## 2015-05-30 NOTE — Progress Notes (Signed)
Nutrition Follow-up   INTERVENTION:   Medical Food Supplement: recommend addition of nutritional supplement if able to advance diet EN: if pt not safe for po intake/unable to advance diet, recommend continuing TF via NG   NUTRITION DIAGNOSIS:   Inadequate oral intake related to acute illness, altered GI function as evidenced by NPO status.   GOAL:   Provide needs based on ASPEN/SCCM guidelines  MONITOR:    (PN, Energy Intake, Anthropometrics, Electrolyte/Renal Profile, Glucose Profile)  REASON FOR ASSESSMENT:   Ventilator, Consult New TPN/TNA  ASSESSMENT:    Pt s/p extubation, pt more alert  Diet Order:   NPO, SLP to re-evaluate diet advancement today  EN: tolerating Vital 1.5 at rate of 55 ml/hr  Digestive System: no signs of TF intolerance   Recent Labs Lab 05/28/15 0918 05/29/15 0822 05/30/15 0400  NA 138 140 143  K 3.6 3.4* 3.6  CL 102 103 106  CO2 31 31 30   BUN 23* 20 21*  CREATININE 0.86 0.82 0.75  CALCIUM 7.4* 7.7* 8.0*  MG 2.2 2.1 2.2  PHOS 3.6 3.1 5.3*  GLUCOSE 179* 192* 103*    Glucose Profile:  Recent Labs  05/30/15 0333 05/30/15 0730 05/30/15 1124  GLUCAP 71 131* 130*   Meds: ss novolog  Height:   Ht Readings from Last 1 Encounters:  05/18/15 5\' 8"  (1.727 m)    Weight:   Wt Readings from Last 1 Encounters:  05/30/15 160 lb 0.9 oz (72.6 kg)    Filed Weights   05/28/15 0500 05/29/15 0230 05/30/15 0500  Weight: 175 lb 0.7 oz (79.4 kg) 171 lb 1.2 oz (77.6 kg) 160 lb 0.9 oz (72.6 kg)    BMI:  Body mass index is 24.34 kg/(m^2).  Estimated Nutritional Needs:   Kcal:  BEE: 1510kcals, TEE: (IF 1.1-1.3)(AF 1.2) 1993-2356kcals  Protein:  93-116g protein (1.2-1.5g/kg)  Fluid:  1925-2310 mL (25-30 ml/kg)   HIGH Care Level  Kerman Passey MS, RD, LDN [phone] Pager  [phone] Weekend/On-Call Pager

## 2015-05-30 NOTE — Progress Notes (Signed)
[facility] ADULT ICU REPLACEMENT PROTOCOL FOR AM LAB REPLACEMENT ONLY  The patient does not apply for the [facility] Adult ICU Electrolyte Replacment Protocol based on the criteria listed below:     Is urine output >/= 0.5 ml/kg/hr for the last 6 hours? No. Patient's UOP is 0 ml/kg/hr  Abnormal electrolyte(s):K3.6  If a panic level lab has been reported, has the CCM MD in charge been notified? Yes.  .   Physician:  S Sommer,MD  Vear Clock 05/30/2015 6:35 AM

## 2015-05-30 NOTE — Progress Notes (Signed)
Inpatient Diabetes Program Recommendations  AACE/ADA: New Consensus Statement on Inpatient Glycemic Control (2015)  Target Ranges:  Prepandial:   less than 140 mg/dL      Peak postprandial:   less than 180 mg/dL (1-2 hours)      Critically ill patients:  140 - 180 mg/dL   Review of Glycemic Control  Results for TRAE, HORWITZ (MRN [id number]) as of 05/30/2015 12:04  Ref. Range 05/29/2015 16:07 05/29/2015 19:35 05/29/2015 21:54 05/29/2015 23:28 05/29/2015 23:30 05/29/2015 23:56 05/30/2015 03:33 05/30/2015 07:30  Glucose-Capillary Latest Ref Range: 65-99 mg/dL 164 (H) 122 (H) 85 41 (LL) 42 (LL) 136 (H) 71 131 (H)    Consider changing Novolog correction to sensitive correction scale 1-3 units q4h, in the phase 3 ICU Glycemic control order set.  Please be sure insulin is given within 1 hour of checking the blood sugar to avoid hypoglycemia.  Gentry Fitz, RN, BA, MHA, CDE Diabetes Coordinator Inpatient Diabetes Program  [phone] (Team Pager) [phone] ([place]) 05/30/2015 12:07 PM

## 2015-05-30 NOTE — Progress Notes (Signed)
70 yr old s/p incarcerated ventral hernia, extubated yesterday.  Much improved today from a mental status standpoint able to ask questions and answered appropriately. Patient states that he is very thirsty and would like to try working with speech therapy today. Patient does state that he is having some pain in his abdomen but states it is much better than it was when he initially came in.  Filed Vitals:   05/30/15 1700 05/30/15 1800  BP: 137/111 155/89  Pulse: 60   Temp:    Resp: 30 29   I/O last 3 completed shifts: In: 1732.6 [Other:100; NG/GT:440; IV Piggyback:400] Out: [id number] [Urine:3250; Drains:23]     PE:  Gen: NAD Res: crackles in bases Cardio: rRR [id number], wound clean and dry, JP drain with serous drainage Ext: 2+ pulses, no edema  CBC Latest Ref Rng 05/28/2015 05/27/2015 05/25/2015  WBC 3.8 - 10.6 K/uL 6.1 6.8 6.4  Hemoglobin 13.0 - 18.0 g/dL 11.1(L) 12.0(L) 12.0(L)  Hematocrit 40.0 - 52.0 % 32.8(L) 36.3(L) 35.5(L)  Platelets 150 - 440 K/uL 206 195 110(L)   CMP Latest Ref Rng 05/30/2015 05/29/2015 05/28/2015  Glucose 65 - 99 mg/dL 103(H) 192(H) 179(H)  BUN 6 - 20 mg/dL 21(H) 20 23(H)  Creatinine 0.61 - 1.24 mg/dL 0.75 0.82 0.86  Sodium 135 - 145 mmol/L 143 140 138  Potassium 3.5 - 5.1 mmol/L 3.6 3.4(L) 3.6  Chloride 101 - 111 mmol/L 106 103 102  CO2 22 - 32 mmol/L 30 31 31   Calcium 8.9 - 10.3 mg/dL 8.0(L) 7.7(L) 7.4(L)    A/P: Marked improvement in mental status today. Continue tube feeds at goal and attempts to work with speech therapy can advance his diet as they see fit. Continue working with physical therapy as she can. Patient could likely be transferred to an LTAC or to a SNF soon.

## 2015-05-30 NOTE — Progress Notes (Signed)
[facility]  Daily Progress Note   Subjective  - 10 Days Post-Op  Still lethargic and not answering questions or providing any history.  Just basically writhing in the bed confused.  Objective Filed Vitals:   05/30/15 0300 05/30/15 0400 05/30/15 0500 05/30/15 0836  BP: 134/74 137/77    Pulse: 82 84 79   Temp: 96.1 F (35.6 C) 95.9 F (35.5 C) 96.3 F (35.7 C)   TempSrc:      Resp: 29 27 19    Height:      Weight:   72.6 kg (160 lb 0.9 oz)   SpO2: 92% 95% 96% 95%    Intake/Output Summary (Last 24 hours) at 05/30/15 0916 Last data filed at 05/30/15 [id number]  Gross per 24 hour  Intake 1031.6 ml  Output   2373 ml  Net -1341.4 ml    PULM  CTAB CV  RRR VASC  No palpable pedal pulses on the right.  Laboratory CBC    Component Value Date/Time   WBC 6.1 05/28/2015 0918   WBC 8.8 08/30/2014 1612   WBC 8.0 07/28/2013 0444   HGB 11.1* 05/28/2015 0918   HGB 14.9 07/28/2013 0444   HCT 32.8* 05/28/2015 0918   HCT 45.5 08/30/2014 1612   HCT 45.0 07/28/2013 0444   PLT 206 05/28/2015 0918   PLT 176 08/30/2014 1612   PLT 164 07/28/2013 0444    BMET    Component Value Date/Time   NA 143 05/30/2015 0400   NA 141 07/28/2013 0444   K 3.6 05/30/2015 0400   K 4.6 07/28/2013 0444   CL 106 05/30/2015 0400   CL 111* 07/28/2013 0444   CO2 30 05/30/2015 0400   CO2 26 07/28/2013 0444   GLUCOSE 103* 05/30/2015 0400   GLUCOSE 86 07/28/2013 0444   BUN 21* 05/30/2015 0400   BUN 6* 07/28/2013 0444   CREATININE 0.75 05/30/2015 0400   CREATININE 1.13 07/28/2013 0444   CALCIUM 8.0* 05/30/2015 0400   CALCIUM 8.7 07/28/2013 0444   GFRNONAA >60 05/30/2015 0400   GFRNONAA >60 07/28/2013 0444   GFRNONAA >60 04/13/2011 0325   GFRAA >60 05/30/2015 0400   GFRAA >60 07/28/2013 0444   GFRAA >60 04/13/2011 0325    Assessment/Planning:    PAD with absent pedal pulses RLE  Unable to assess symptoms due to mental status.  No skin loss/gangrenous changes at this  point.  No urgent need for intervention as best I can tell.  Can follow up in the office unless complains of right foot/leg pain more when his mental status clears.  ETOH withdrawal.  Seems stable.  Has protected airway.  MS remains poor.    DEW,JASON  05/30/2015, 9:16 AM

## 2015-05-30 NOTE — Progress Notes (Signed)
Patient ID: Brady Smith, male   DOB: Feb 15, 1946, 70 y.o.   MRN: [id number] [facility] PROGRESS NOTE  Brady Smith [id number] DOB: Jul 30, 1945 DOA: 05/17/2015 PCP: Halina Maidens, MD  HPI/Subjective: Patient is awake and alert. He is able to answer some more questions today. He actually asked me my name.  Objective: Filed Vitals:   05/30/15 0400 05/30/15 0500  BP: 137/77   Pulse: 84 79  Temp: 95.9 F (35.5 C) 96.3 F (35.7 C)  Resp: 27 19    Filed Weights   05/28/15 0500 05/29/15 0230 05/30/15 0500  Weight: 79.4 kg (175 lb 0.7 oz) 77.6 kg (171 lb 1.2 oz) 72.6 kg (160 lb 0.9 oz)    ROS: Review of Systems  Unable to perform ROS Respiratory: Positive for shortness of breath. Negative for cough.   Cardiovascular: Negative for chest pain.  Gastrointestinal: Negative for abdominal pain.  Neurological: Negative for headaches.   Limited secondary to altered mental status Exam: Physical Exam  HENT:  Nose: No mucosal edema.  Mouth/Throat: No oropharyngeal exudate or posterior oropharyngeal edema.  Eyes: Conjunctivae and lids are normal. Pupils are equal, round, and reactive to light.  Neck: No JVD present. Carotid bruit is not present. No edema present. No thyroid mass and no thyromegaly present.  Cardiovascular: S1 normal, S2 normal and normal heart sounds.  Exam reveals no gallop.   No murmur heard. Pulses:      Dorsalis pedis pulses are 0 on the right side, and 1+ on the left side.  Respiratory: No respiratory distress. He has no wheezes. He has rhonchi in the right lower field and the left lower field. He has no rales.  GI: Soft. He exhibits distension. Bowel sounds are decreased. There is no tenderness.  Musculoskeletal:       Right ankle: He exhibits no swelling.       Left ankle: He exhibits no swelling.  Lymphadenopathy:    He has no cervical adenopathy.  Neurological: He is alert.  Able to lift up arms up off the bed  Skin: Skin is warm. Nails show  no clubbing.  Right foot cool to touch  Psychiatric: His speech is slurred. He is slowed.      Data Reviewed: Basic Metabolic Panel:  Recent Labs Lab 05/26/15 0455 05/27/15 0331 05/28/15 0918 05/29/15 0822 05/30/15 0400  NA 141 141 138 140 143  K 4.0 3.7 3.6 3.4* 3.6  CL 106 106 102 103 106  CO2 30 32 31 31 30   GLUCOSE 145* 151* 179* 192* 103*  BUN 23* 24* 23* 20 21*  CREATININE 0.88 0.80 0.86 0.82 0.75  CALCIUM 7.3* 7.4* 7.4* 7.7* 8.0*  MG 2.2 2.1 2.2 2.1 2.2  PHOS 2.9 3.5 3.6 3.1 5.3*   CBC:  Recent Labs Lab 05/24/15 0455 05/25/15 0411 05/27/15 1247 05/28/15 0918  WBC 5.9 6.4 6.8 6.1  NEUTROABS  --   --  6.0  --   HGB 12.5* 12.0* 12.0* 11.1*  HCT 37.8* 35.5* 36.3* 32.8*  MCV 98.3 94.4 97.6 94.0  PLT 96* 110* 195 206    CBG:  Recent Labs Lab 05/29/15 2330 05/29/15 2356 05/30/15 0333 05/30/15 0730 05/30/15 1124  GLUCAP 42* 136* 71 131* 130*    Recent Results (from the past 240 hour(s))  MRSA PCR Screening     Status: None   Collection Time: 05/20/15  5:08 PM  Result Value Ref Range Status   MRSA by PCR NEGATIVE NEGATIVE Final  Comment:        The GeneXpert MRSA Assay (FDA approved for NASAL specimens only), is one component of a comprehensive MRSA colonization surveillance program. It is not intended to diagnose MRSA infection nor to guide or monitor treatment for MRSA infections.   Culture, respiratory (NON-Expectorated)     Status: None   Collection Time: 05/22/15 10:29 AM  Result Value Ref Range Status   Specimen Description TRACHEAL ASPIRATE  Final   Special Requests NONE  Final   Gram Stain   Final    FEW WBC SEEN RARE SQUAMOUS EPITHELIAL CELLS PRESENT NO ORGANISMS SEEN    Culture LIGHT GROWTH CITROBACTER BRAAKII  Final   Report Status 05/25/2015 FINAL  Final   Organism ID, Bacteria CITROBACTER BRAAKII  Final      Susceptibility   Citrobacter braakii - MIC*    CEFAZOLIN >=64 RESISTANT Resistant     CEFEPIME <=1 SENSITIVE  Sensitive     CEFTAZIDIME <=1 SENSITIVE Sensitive     CEFTRIAXONE <=1 SENSITIVE Sensitive     CIPROFLOXACIN <=0.25 SENSITIVE Sensitive     GENTAMICIN <=1 SENSITIVE Sensitive     IMIPENEM 0.5 SENSITIVE Sensitive     TRIMETH/SULFA <=20 SENSITIVE Sensitive     PIP/TAZO <=4 SENSITIVE Sensitive     * LIGHT GROWTH CITROBACTER BRAAKII  CULTURE, BLOOD (ROUTINE X 2) w Reflex to PCR ID Panel     Status: None   Collection Time: 05/22/15 10:37 AM  Result Value Ref Range Status   Specimen Description BLOOD LT [facility]  Final   Special Requests BOTTLES DRAWN AEROBIC AND ANAEROBIC 7ML ANA 7MLAER  Final   Culture NO GROWTH 5 DAYS  Final   Report Status 05/27/2015 FINAL  Final  CULTURE, BLOOD (ROUTINE X 2) w Reflex to PCR ID Panel     Status: None   Collection Time: 05/22/15 10:50 AM  Result Value Ref Range Status   Specimen Description BLOOD RT AC  Final   Special Requests   Final    BOTTLES DRAWN AEROBIC AND ANAEROBIC 7ML ANA 7ML AER   Culture NO GROWTH 5 DAYS  Final   Report Status 05/27/2015 FINAL  Final  Urine culture     Status: None   Collection Time: 05/22/15 11:47 AM  Result Value Ref Range Status   Specimen Description URINE, RANDOM  Final   Special Requests NONE  Final   Culture NO GROWTH 1 DAY  Final   Report Status 05/23/2015 FINAL  Final  CULTURE, BLOOD (ROUTINE X 2) w Reflex to PCR ID Panel     Status: None (Preliminary result)   Collection Time: 05/27/15  1:26 PM  Result Value Ref Range Status   Specimen Description BLOOD RIGHT HAND  Final   Special Requests BOTTLES DRAWN AEROBIC AND ANAEROBIC 3CC  Final   Culture NO GROWTH 3 DAYS  Final   Report Status PENDING  Incomplete  CULTURE, BLOOD (ROUTINE X 2) w Reflex to PCR ID Panel     Status: None (Preliminary result)   Collection Time: 05/27/15  1:33 PM  Result Value Ref Range Status   Specimen Description BLOOD LEFT HAND  Final   Special Requests BOTTLES DRAWN AEROBIC AND ANAEROBIC 5CC  Final   Culture NO GROWTH 3 DAYS  Final    Report Status PENDING  Incomplete     Scheduled Meds: . antiseptic oral rinse  7 mL Mouth Rinse q12n4p  . budesonide (PULMICORT) nebulizer solution  0.5 mg Nebulization BID  . chlorhexidine  15 mL Mouth Rinse BID  . ciprofloxacin  400 mg Intravenous Q12H  . [START ON 05/31/2015] enoxaparin (LOVENOX) injection  40 mg Subcutaneous Q24H  . free water  200 mL Per Tube 3 times per day  . insulin aspart  2-6 Units Subcutaneous 4 times per day  . ipratropium-albuterol  3 mL Nebulization Q6H  . pantoprazole sodium  40 mg Per Tube Daily  . thiamine  100 mg Oral Daily   Continuous Infusions: . feeding supplement (VITAL 1.5 CAL)      Assessment/Plan:  1. Drug fever to invanz. Fever curve trending better. His last dose of IV Invanz was on Saturday. Patient on IV Cipro for couple days for Citrobacter in the sputum 2. Incarcerated ventral hernia. Status post removal of necrotic bowel and incarcerated hernia repair. Patient currently on tube feeding. Speech pathologist to evaluate to see if he is able to swallow. 3. Alcohol withdrawal. Patient should be through withdrawal process by now. Decrease the standing dose IV Valium to 2.5 mg IV daily at bedtime 4. Acute respiratory failure with hypoxia. Patient was reintubated 05/24/2015 secondary to encephalopathy. Patient extubated 05/28/2015. Currently breathing comfortably on nasal cannula 5. Atrial fibrillation. Rate controlled. Hold anticoagulation at this point. 6. History of COPD on  Pulmicort nebulizers 7. Hypernatremia improved with D5W 8. Decreased pulse right lower extremity. Vascular surgery consultation appreciated. Likely peripheral vascular disease. 9. Acute encephalopathy. Likely secondary to alcohol withdrawal. Seems a little more improved today than yesterday.  Code Status:     Code Status Orders        Start     Ordered   05/18/15 0352  Full code   Continuous     05/18/15 0351    Code Status History    Date Active Date Inactive  Code Status Order ID Comments User Context   This patient has a current code status but no historical code status.    Advance Directive Documentation        Most Recent Value   Type of Advance Directive  Healthcare Power of Attorney   Pre-existing out of facility DNR order (yellow form or pink MOST form)     "MOST" Form in Place?       Disposition Plan: To be determined  Consultants:  Critical care specialist  Surgery  Vascular surgery  Infectious disease  Antibiotics:  Changed antibiotics to Cipro  Time spent: 20 minutes  Loletha Grayer  [facility]

## 2015-05-30 NOTE — Progress Notes (Signed)
Brady Smith Date of Admission:  05/17/2015     ID: Brady Smith is a 70 y.o. male with fevers, s/p incarcerated hernia surgery, PNA  Principal Problem:   Small bowel obstruction (HCC) Active Problems:   Incarcerated ventral hernia   Essential hypertension, malignant   Urinary retention   UTI (urinary tract infection)   Acute respiratory failure (HCC)   ETOH abuse   Acute encephalopathy   Subjective: More alert, no fevers   ROS  Eleven systems are reviewed and negative except per hpi  Medications:  Antibiotics Given (last 72 hours)    Date/Time Action Medication Dose Rate   05/27/15 1738 Given   ertapenem (INVANZ) 1 g in sodium chloride 0.9 % 50 mL IVPB 1 g 100 mL/hr   05/28/15 1510 Given   ciprofloxacin (CIPRO) IVPB 400 mg 400 mg 200 mL/hr   05/29/15 [id number] Given   ciprofloxacin (CIPRO) IVPB 400 mg 400 mg 200 mL/hr   05/29/15 1634 Given   ciprofloxacin (CIPRO) IVPB 400 mg 400 mg 200 mL/hr   05/30/15 [id number] Given   ciprofloxacin (CIPRO) IVPB 400 mg 400 mg 200 mL/hr     . antiseptic oral rinse  7 mL Mouth Rinse q12n4p  . budesonide (PULMICORT) nebulizer solution  0.5 mg Nebulization BID  . chlorhexidine  15 mL Mouth Rinse BID  . ciprofloxacin  400 mg Intravenous Q12H  . [START ON 05/31/2015] enoxaparin (LOVENOX) injection  40 mg Subcutaneous Q24H  . free water  200 mL Per Tube 3 times per day  . insulin aspart  1-3 Units Subcutaneous 6 times per day  . ipratropium-albuterol  3 mL Nebulization Q6H  . pantoprazole sodium  40 mg Per Tube Daily  . thiamine  100 mg Oral Daily    Objective: Vital signs in last 24 hours: Temp:  [95.9 F (35.5 C)-100.2 F (37.9 C)] 96.3 F (35.7 C) (03/07 0500) Pulse Rate:  [79-103] 79 (03/07 0500) Resp:  [19-34] 19 (03/07 0500) BP: (134-171)/(73-139) 137/77 mmHg (03/07 0400) SpO2:  [89 %-96 %] 95 % (03/07 0836) Weight:  [72.6 kg (160 lb 0.9 oz)] 72.6 kg (160 lb 0.9 oz) (03/07 0500) Constitutional:  groggy but arousable.  HENT: perrla,  Mouth/Throat: Oropharynx is clear and moist. No oropharyngeal exudate. NGT in place Cardiovascular: Normal rate, regular rhythm and normal heart sounds. Pulmonary/Chest: bil rhonchi  Abdominal: Soft. Bowel sounds are normal. Mild diffuse TTP, midline incision with staples but very clean and no drainage. JP drain LLQ qith ss drainage He has no cervical adenopathy.  Neurological: He is lethargic EXT no edema, RLE with cool to touch, some mottling  Skin: Skin is warm and dry. No rash noted. No erythema.  Psychiatric: lethargic  Access- Picc RUE, NGT, Foley   Lab Results  Recent Labs  05/28/15 0918 05/29/15 0822 05/30/15 0400  WBC 6.1  --   --   HGB 11.1*  --   --   HCT 32.8*  --   --   NA 138 140 143  K 3.6 3.4* 3.6  CL 102 103 106  CO2 31 31 30   BUN 23* 20 21*  CREATININE 0.86 0.82 0.75    Microbiology: Results for orders placed or performed during the hospital encounter of 05/17/15  Urine culture     Status: None   Collection Time: 05/18/15  9:21 AM  Result Value Ref Range Status   Specimen Description URINE, CATHETERIZED  Final   Special Requests Normal  Final  Culture NO GROWTH 1 DAY  Final   Report Status 05/19/2015 FINAL  Final  MRSA PCR Screening     Status: None   Collection Time: 05/20/15  5:08 PM  Result Value Ref Range Status   MRSA by PCR NEGATIVE NEGATIVE Final    Comment:        The GeneXpert MRSA Assay (FDA approved for NASAL specimens only), is one component of a comprehensive MRSA colonization surveillance program. It is not intended to diagnose MRSA infection nor to guide or monitor treatment for MRSA infections.   Culture, respiratory (NON-Expectorated)     Status: None   Collection Time: 05/22/15 10:29 AM  Result Value Ref Range Status   Specimen Description TRACHEAL ASPIRATE  Final   Special Requests NONE  Final   Gram Stain   Final    FEW WBC SEEN RARE SQUAMOUS EPITHELIAL CELLS PRESENT NO  ORGANISMS SEEN    Culture LIGHT GROWTH CITROBACTER BRAAKII  Final   Report Status 05/25/2015 FINAL  Final   Organism ID, Bacteria CITROBACTER BRAAKII  Final      Susceptibility   Citrobacter braakii - MIC*    CEFAZOLIN >=64 RESISTANT Resistant     CEFEPIME <=1 SENSITIVE Sensitive     CEFTAZIDIME <=1 SENSITIVE Sensitive     CEFTRIAXONE <=1 SENSITIVE Sensitive     CIPROFLOXACIN <=0.25 SENSITIVE Sensitive     GENTAMICIN <=1 SENSITIVE Sensitive     IMIPENEM 0.5 SENSITIVE Sensitive     TRIMETH/SULFA <=20 SENSITIVE Sensitive     PIP/TAZO <=4 SENSITIVE Sensitive     * LIGHT GROWTH CITROBACTER BRAAKII  CULTURE, BLOOD (ROUTINE X 2) w Reflex to PCR ID Panel     Status: None   Collection Time: 05/22/15 10:37 AM  Result Value Ref Range Status   Specimen Description BLOOD LT [facility]  Final   Special Requests BOTTLES DRAWN AEROBIC AND ANAEROBIC 7ML ANA 7MLAER  Final   Culture NO GROWTH 5 DAYS  Final   Report Status 05/27/2015 FINAL  Final  CULTURE, BLOOD (ROUTINE X 2) w Reflex to PCR ID Panel     Status: None   Collection Time: 05/22/15 10:50 AM  Result Value Ref Range Status   Specimen Description BLOOD RT AC  Final   Special Requests   Final    BOTTLES DRAWN AEROBIC AND ANAEROBIC 7ML ANA 7ML AER   Culture NO GROWTH 5 DAYS  Final   Report Status 05/27/2015 FINAL  Final  Urine culture     Status: None   Collection Time: 05/22/15 11:47 AM  Result Value Ref Range Status   Specimen Description URINE, RANDOM  Final   Special Requests NONE  Final   Culture NO GROWTH 1 DAY  Final   Report Status 05/23/2015 FINAL  Final  CULTURE, BLOOD (ROUTINE X 2) w Reflex to PCR ID Panel     Status: None (Preliminary result)   Collection Time: 05/27/15  1:26 PM  Result Value Ref Range Status   Specimen Description BLOOD RIGHT HAND  Final   Special Requests BOTTLES DRAWN AEROBIC AND ANAEROBIC 3CC  Final   Culture NO GROWTH 3 DAYS  Final   Report Status PENDING  Incomplete  CULTURE, BLOOD (ROUTINE X 2) w Reflex  to PCR ID Panel     Status: None (Preliminary result)   Collection Time: 05/27/15  1:33 PM  Result Value Ref Range Status   Specimen Description BLOOD LEFT HAND  Final   Special Requests BOTTLES DRAWN AEROBIC AND ANAEROBIC  5CC  Final   Culture NO GROWTH 3 DAYS  Final   Report Status PENDING  Incomplete     Studies/Results:  Dg Chest 1 View  05/22/2015  CLINICAL DATA:  Line placement. EXAM: CHEST 1 VIEW COMPARISON:  Earlier today. FINDINGS: 1742 hours. Endotracheal tube terminates 4.7 cm above carina.Nasogastric tube terminates at the gastric body. A right-sided PICC line is new, and terminates at the low SVC versus cavoatrial junction. Midline trachea. Normal heart size for level of inspiration. No pleural effusion or pneumothorax. Low lung volumes with improved bibasilar airspace disease. IMPRESSION: Right-sided PICC line terminates at the low SVC versus superior caval/atrial junction. No pneumothorax. Improved bibasilar aeration, with mild atelectasis remaining. Electronically Signed   By: Abigail Miyamoto M.D.   On: 05/22/2015 17:59   Ct Abdomen Pelvis W Contrast  05/27/2015  CLINICAL DATA:  One week postop from bowel resection for bowel obstruction. Fever. Bladder carcinoma. EXAM: CT ABDOMEN AND PELVIS WITH CONTRAST TECHNIQUE: Multidetector CT imaging of the abdomen and pelvis was performed using the standard protocol following bolus administration of intravenous contrast. CONTRAST:  14mL OMNIPAQUE IOHEXOL 350 MG/ML SOLN, 1 OMNIPAQUE IOHEXOL 240 MG/ML SOLN COMPARISON:  05/17/2015 FINDINGS: Lower chest: New small bilateral pleural effusions and bibasilar atelectasis demonstrated. Hepatobiliary: No masses or other significant abnormality. Prior cholecystectomy noted. No evidence of biliary dilatation. Pancreas: No mass, inflammatory changes, or other significant abnormality. Spleen: Within normal limits in size and appearance. Adrenals/Urinary Tract: No evidence of adrenal or renal masses. Tiny sub-cm  right renal cyst remains stable. No evidence of hydronephrosis. Patient has undergone cystectomy with ileal loop diversion into pelvic neobladder. A Foley catheter is seen within the neobladder which is decompressed. Stomach/Bowel: Recent postsurgical changes are seen from repair of large ventral abdominal wall hernia. Mild wall thickening of the transverse colon is seen near the site of the hernia repair likely due to postop edema. No evidence of obstruction. There is no evidence of recurrent hernia. No evidence of abscess or free air. Vascular/Lymphatic: No pathologically enlarged lymph nodes. 3.0 cm infrarenal abdominal aortic aneurysm remains stable. No evidence of aneurysm leak or rupture. Reproductive: No mass or other significant abnormality. Other: None. Musculoskeletal:  No suspicious bone lesions identified. IMPRESSION: Expected postoperative changes from repair recent ventral abdominal wall hernia. Low residual transverse colonic wall thickening near the hernia site is likely due to postop edema. No evidence of bowel obstruction or abscess. New small bilateral pleural effusions and bibasilar atelectasis. Stable postop changes from previous cystectomy and ileal loop diversion into pelvic neobladder. No evidence of hydronephrosis. No evidence of recurrent or metastatic carcinoma. Stable 3.0 cm infrarenal abdominal aortic aneurysm. Recommend followup by ultrasound in 3 years. This recommendation follows ACR consensus guidelines: White Paper of the ACR Incidental Findings Committee II on Vascular Findings. Natasha Mead Coll Radiol 2013; 10:789-794 Electronically Signed   By: Earle Gell M.D.   On: 05/27/2015 16:07   Ct Abdomen Pelvis W Contrast  05/17/2015  CLINICAL DATA:  Generalized abdominal and chest pain since this afternoon. History bladder cancer. EXAM: CT ABDOMEN AND PELVIS WITH CONTRAST TECHNIQUE: Multidetector CT imaging of the abdomen and pelvis was performed using the standard protocol following bolus  administration of intravenous contrast. CONTRAST:  133mL OMNIPAQUE IOHEXOL 300 MG/ML  SOLN COMPARISON:  07/27/2013.  04/12/2011 FINDINGS: Lower chest: Stable appearance of chronic right pleural thickening posteriorly. Hepatobiliary: No focal abnormality within the liver parenchyma. Gallbladder surgically absent. No intrahepatic or extrahepatic biliary dilation. Pancreas: No focal mass lesion. No  dilatation of the main duct. No intraparenchymal cyst. No peripancreatic edema. Spleen: No splenomegaly. No focal mass lesion. Adrenals/Urinary Tract: Stable tiny bilateral adrenal nodules consistent with adenomas. Cortical scarring noted in the right kidney. No enhancing lesion in either kidney. Stable small right renal cysts. Left kidney is unremarkable. Mild right hydronephrosis is associated with right hydroureter with the right ureter reimplanted along the left anterior bladder wall. On previous exams, at this reimplanted ureter, potentially a conduit, had thin walls but demonstrates a thick enhancing wall on today's study. There is some mild wall thickening in the right aspect of the neobladder. Left ureter also appears to tiny into the conduit. Stomach/Bowel: Stomach is mildly distended. Duodenum is normally positioned as is the ligament of Treitz. Multiple small bowel loops extend through a complex ventral hernia that also contains transverse colon. There is a dilated small bowel loop measuring up to 3.8 cm that tracks into the hernia with decompressed, non-opacified small bowel exiting the hernia sac. The terminal ileum is normal. The appendix is not visualized, but there is no edema or inflammation in the region of the cecum. Transverse colon extends into the hernia sac without proximal colonic dilatation. Diverticular change noted in the left colon without diverticulitis. Vascular/Lymphatic: Abdominal aorta measures up to 3.0 cm and diameter, consistent with aneurysm. Atherosclerotic calcification is associated in  the abdominal aortic wall. There is no gastrohepatic or hepatoduodenal ligament lymphadenopathy. No intraperitoneal or retroperitoneal lymphadenopathy. No pelvic sidewall lymphadenopathy. Reproductive: Prostate gland appears to be surgically absent. Other: No intraperitoneal free fluid. Musculoskeletal: Bone windows reveal no worrisome lytic or sclerotic osseous lesions. Bilateral pars interarticularis defects are seen at L5. IMPRESSION: Large complex ventral hernia as seen on multiple previous imaging studies. Small bowel and colon is contained within the hernia sac and on today's study, and there is a distended contrast filled small bowel loop that appears to track into the hernia where multiple dilated small bowel loops are present. Small bowel leaving the hernia sac is decompressed and non-opacified. As such, imaging features are suspicious for incarcerated small bowel with obstruction related to the ventral hernia. Patient appears to be status post cystectomy with urinary diversion to a neobladder. There is some wall thickening in the urinary conduit as it ties into the neobladder, but this is nonspecific. On previous studies, the wall to this structure was very thin and the wall thickening today makes inflammation a concern. There is also some new wall thickening associated with the right aspect of the neobladder. Stable tiny bilateral adrenal nodules, likely representing adenomas. Abdominal aortic atherosclerosis with 3.0 cm aneurysm, stable. Electronically Signed   By: Misty Stanley M.D.   On: 05/17/2015 23:32   Dg Chest Port 1 View  05/28/2015  CLINICAL DATA:  Acute respiratory failure, history bladder cancer, renal cancer, pneumonia, smoking EXAM: PORTABLE CHEST 1 VIEW COMPARISON:  Portable exam 0608 hours compared to 05/25/2015 FINDINGS: Tip of endotracheal tube projects 1.8 cm above carina. Nasogastric tube coiled in proximal stomach. RIGHT arm PICC line tip projects over SVC. Upper normal heart size.  Stable mediastinal contours and pulmonary vascularity. Perihilar to basilar infiltrates again identified question mild pulmonary edema. Bibasilar atelectasis. Cannot completely exclude small RIGHT pleural effusion. No pneumothorax. IMPRESSION: Question mild pulmonary edema with coexistent bibasilar atelectasis. Electronically Signed   By: Lavonia Dana M.D.   On: 05/28/2015 08:06   Portable Chest Xray  05/25/2015  CLINICAL DATA:  Hypoxia EXAM: PORTABLE CHEST 1 VIEW COMPARISON:  May 24, 2015 FINDINGS: Endotracheal tube  tip is 2.5 cm above the carina. Central catheter tip is in the superior vena cava. Nasogastric tube tip and side port are in the stomach. No pneumothorax. There are small pleural effusions bilaterally with patchy bibasilar atelectasis. There is slight interstitial edema in the bases. Heart is borderline prominent with pulmonary vascularity within normal limits. No adenopathy evident. IMPRESSION: Tube and catheter positions as described without pneumothorax. Suspect a degree of congestive heart failure. There is patchy bibasilar atelectasis as well. No airspace consolidation evident. Electronically Signed   By: Lowella Grip III M.D.   On: 05/25/2015 09:14   Dg Chest Port 1 View  05/24/2015  CLINICAL DATA:  Acute respiratory failure EXAM: PORTABLE CHEST 1 VIEW COMPARISON:  05/22/2015 FINDINGS: Right-sided PICC line, endotracheal tube and nasogastric catheter are all again seen and stable in appearance. Cardiac shadow is within normal limits. Increasing bibasilar atelectasis is noted. No bony abnormality is seen. IMPRESSION: Increasing bibasilar atelectasis. Electronically Signed   By: Inez Catalina M.D.   On: 05/24/2015 07:42   Dg Chest Port 1 View  05/22/2015  CLINICAL DATA:  Respiratory failure EXAM: PORTABLE CHEST 1 VIEW COMPARISON:  05/21/2015 FINDINGS: Endotracheal tube and NG tube are unchanged. Stable cardiac silhouette. There is increase in bibasilar opacities. Upper lungs are clear.  IMPRESSION: 1. Stable support apparatus. 2. Increased bibasilar linear opacities representing atelectasis versus infiltrate. Electronically Signed   By: Suzy Bouchard M.D.   On: 05/22/2015 07:28   Dg Chest Port 1 View  05/21/2015  CLINICAL DATA:  Intubated, smoker EXAM: PORTABLE CHEST 1 VIEW COMPARISON:  05/20/2015 FINDINGS: Endotracheal tube is 5 cm above the carina. NG tube is in the stomach. The heart is borderline in size. Bibasilar atelectasis or infiltrates, right greater than left. Possible small right effusion. IMPRESSION: Bibasilar atelectasis or infiltrates, right greater than left. Small right effusion. Electronically Signed   By: Rolm Baptise M.D.   On: 05/21/2015 08:13   Dg Chest Port 1 View  05/20/2015  CLINICAL DATA:  Acute onset of cough.  Initial encounter. EXAM: PORTABLE CHEST 1 VIEW COMPARISON:  Chest radiograph from 07/14/2014 FINDINGS: The lungs are well-aerated. Peribronchial thickening is noted. Mild bibasilar opacities may reflect mild interstitial edema or possibly pneumonia. There is no evidence of pleural effusion or pneumothorax. The cardiomediastinal silhouette is within normal limits. No acute osseous abnormalities are seen. IMPRESSION: Peribronchial thickening noted. Mild bibasilar airspace opacities may reflect mild interstitial edema or possibly pneumonia. Electronically Signed   By: Garald Balding M.D.   On: 05/20/2015 05:00   Dg Abd 2 Views  05/20/2015  CLINICAL DATA:  70 year old male -followup small bowel obstruction. EXAM: ABDOMEN - 2 VIEW COMPARISON:  05/19/2015 and prior radiographs.  05/17/2015 CT FINDINGS: Dilated small bowel loops within the abdomen again noted. Gas and stool in the colon again noted. Cholecystectomy clips again identified. IMPRESSION: Little significant change in dilated small bowel loops. Electronically Signed   By: Margarette Canada M.D.   On: 05/20/2015 09:59   Dg Abd 2 Views  05/19/2015  CLINICAL DATA:  Ventral hernia with obstruction EXAM:  ABDOMEN - 2 VIEW COMPARISON:  05/18/2015 FINDINGS: NG tube has been removed. Again noted gaseous distended small bowel loops mid abdomen suspicious for ileus or partial bowel obstruction. Postcholecystectomy surgical clips. Moderate stool and gas noted in right colon and splenic flexure of the colon. Surgical clips are noted within pelvis. No evidence of free abdominal air. IMPRESSION: Again noted gaseous distended small bowel loops mid abdomen suspicious for ileus  or partial bowel obstruction. Postcholecystectomy surgical clips. Moderate stool and gas noted in right colon and splenic flexure of the colon. Electronically Signed   By: Lahoma Crocker M.D.   On: 05/19/2015 08:18   Dg Abd Portable 2v  05/18/2015  CLINICAL DATA:  Patient with generalized abdominal pain. Abdominal discomfort. EXAM: PORTABLE ABDOMEN - 2 VIEW COMPARISON:  CT abdomen pelvis 05/17/2015 FINDINGS: Enteric tube tip and side-port project over the stomach. Cholecystectomy clips. Lung bases are clear. There multiple prominent gaseous distended loops of small bowel within the central abdomen measuring up to 4.3 cm. Stool and gas demonstrated within the colon. Pelvic surgical clips. Lumbar spine degenerative changes. IMPRESSION: Enteric tube tip and side-port project over the stomach. Multiple gaseous distended loops of small bowel within the central abdomen concerning for early and/or partial bowel obstruction. Electronically Signed   By: Lovey Newcomer M.D.   On: 05/18/2015 11:19    Assessment/Plan: LEMAR LYGA is a 70 y.o. male with complicated post op course after incarcerated ventral hernia, with persistent fevers but nml wbc. Only + cx is sputum with citrobacter. S/p 7 days ertapenem and now on cipro. CT abd neg for abscess. Some concern of ETOH and wd but is well into hospitalization  Possible sources include abd, PNA, sinusitis with NGT in place, picc line infection.  Clincially improving, last fever 3/6.  Recommendations Continue  cipro for another 2 days and monitor cultures If improving stop cipro on 3/8 and follow off abx.  Monitor for increasing fevers or wbc off abx Thank you very much for the consult. Will follow with you.  [place], [place]   05/30/2015, 3:09 PM

## 2015-05-30 NOTE — Care Management (Addendum)
Discussed ltac with surgeon and intensivist and state that patient could be appropriate for ltac consideration.   Rehab cnsults are pending.  Tube feeds.  Remains lethargic but is oriented and making appropriate verbal responses.

## 2015-05-30 NOTE — Progress Notes (Signed)
Speech Language Pathology Treatment: Dysphagia  Patient Details Name: Brady Smith MRN: [id number] DOB: 10-Feb-1946 Today's Date: 05/30/2015 Time: 1350-1430 SLP Time Calculation (min) (ACUTE ONLY): 40 min  Assessment / Plan / Recommendation Clinical Impression  Pt continues to present w/ increased risk for aspiration and demo. Decreased awareness for the task of po intake (eating/drinking) - exhibiting refusal behaviors despite max. Cues and encouragement by SLP and NSG. Rec. Continue w/ NPO status and ongoing assessment of swallow function as pt's Cognitive status continues to improve. NSG and MD updated.    HPI HPI: Pt is a 70 y.o. male w/ h/o kidney and bladder Ca, COPD smoking ~2 packs of cigarettes daily(currenlty), pneumonia, and other medical issues who presents to emergency department for a worsening abdominal hernia. Patient states that after eating a large volume of corn last night, he started having some nausea followed by swelling in his midline and a new area of "bump". He's had a known history of a very large midline hernia and this is his fourth presentation to our group for this identical complaint. Patient states that he was told that the last 2 visits that he should seek his hernia repair but he did not follow up with this advice. Patient currently only complaining of abdominal pain at the site of his hernia. He denies any current nausea, vomiting, chest pain, shortness of breath. He has been passing flatus and his last bowel movement was earlier today albeit small per the patient. Patient admits to smoking 2 packs of cigarettes per day. Patient also states that he drinks one to 2 cans of beer per day as well. During this hospitalization, pt had bowel surgery and remained orally intubated ~1 week d/t respiratory status. He had a bowel resection over a week ago for an incarcerated ventral hernia with necrotic bowel. He had a prolonged postoperative course secondary to alcohol withdrawals  and has just come off the ventilator this past weekend. He remains very lethargic and is unable to provide any history and all of the history is obtained from the previous medical record. It was noted that his right foot did not have palpable pedal pulses and was slightly cooler than the left. He does not have any open ulcerations, infection, gangrenous changes, or nonviable tissue of the right foot that I can see. He is unable to provide any history in terms of whether or not he has claudication or describes any pain. The nurses have used the appropriate padding devices on the right heel to avoid any pressure ulcerations. He does have a palpable pulse in the left foot although it is not prominent. Continues w/ the NG in place along w/ TPN for nutritional support. Pt is drowsy; weaned from any meds per MD. Mumbled speech per NSG and requesting "water" sometimes. NG in place for tx.        SLP Plan  Continue with current plan of care     Recommendations  Diet recommendations: NPO Liquids provided via:  (TBD) Medication Administration: Via alternative means Supervision: Full supervision/cueing for compensatory strategies Compensations:  (TBD) Postural Changes and/or Swallow Maneuvers:  (TBD)             General recommendations:  (ongoing assessment ) Oral Care Recommendations: Oral care QID;Staff/trained caregiver to provide oral care Follow up Recommendations: Skilled Nursing facility (TBD) Plan: Continue with current plan of care     GO               [name], MS,  CCC-SLP  [name] 05/30/2015, 4:55 PM

## 2015-05-30 NOTE — Progress Notes (Signed)
[facility] NOTE  Pharmacy Consult for electrolytes and glucose management  Indication: Tube Feeds/ICU Status   No Known Allergies  Patient Measurements: Height: 5\' 8"  (172.7 cm) Weight: 160 lb 0.9 oz (72.6 kg) IBW/kg (Calculated) : 68.4  Vital Signs: Temp: 96.3 F (35.7 C) (03/07 0500) BP: 137/77 mmHg (03/07 0400) Pulse Rate: 79 (03/07 0500) Intake/Output from previous day: 03/06 0701 - 03/07 0700 In: 1217.6 [NG/GT:185; IV Piggyback:200; TPN:792.6] Out: 2723 [Urine:2700; Drains:23] Intake/Output from this shift: Total I/O In: 60 [Other:60] Out: -   Labs:  Recent Labs  05/28/15 0918 05/29/15 0822 05/30/15 0400  WBC 6.1  --   --   HGB 11.1*  --   --   HCT 32.8*  --   --   PLT 206  --   --   CREATININE 0.86 0.82 0.75  MG 2.2 2.1 2.2  PHOS 3.6 3.1 5.3*   Estimated Creatinine Clearance: 84.3 mL/min (by C-G formula based on Cr of 0.75).   Recent Labs  05/30/15 0333 05/30/15 0730 05/30/15 1124  GLUCAP 71 131* 130*     Assessment: Pharmacy consulted for electrolytes and glucose management for 70 yo male s/p incarcerated hernia repair. Patient previously on TPN now on tube feeds at 90mL/hr.    Plan:  1. Electrolytes: No replacement warranted at this time. Pharmacy will continue to monitor and adjust per consult.    2. Glucose: Patient with glycemia overnight. Will decrease to sensitive sliding scale coverage Q4hr. Will continue to monitor.    Pharmacy will continue to monitor and adjust per consult.   Simpson,Michael L 05/30/2015,2:40 PM

## 2015-05-31 LAB — BASIC METABOLIC PANEL
ANION GAP: 10 (ref 5–15)
BUN: 30 mg/dL — ABNORMAL HIGH (ref 6–20)
CALCIUM: 8 mg/dL — AB (ref 8.9–10.3)
CHLORIDE: 108 mmol/L (ref 101–111)
CO2: 27 mmol/L (ref 22–32)
Creatinine, Ser: 0.83 mg/dL (ref 0.61–1.24)
GFR calc non Af Amer: >60 mL/min (ref >60)
Glucose, Bld: 168 mg/dL — ABNORMAL HIGH (ref 65–99)
Potassium: 3.5 mmol/L (ref 3.5–5.1)
SODIUM: 145 mmol/L (ref 135–145)

## 2015-05-31 LAB — GLUCOSE, CAPILLARY
GLUCOSE-CAPILLARY: 133 mg/dL — AB (ref 65–99)
GLUCOSE-CAPILLARY: 125 mg/dL — AB (ref 65–99)
Glucose-Capillary: 187 mg/dL — ABNORMAL HIGH (ref 65–99)
Glucose-Capillary: 116 mg/dL — ABNORMAL HIGH (ref 65–99)
Glucose-Capillary: 126 mg/dL — ABNORMAL HIGH (ref 65–99)

## 2015-05-31 MED ORDER — MIDAZOLAM HCL 2 MG/2ML IJ SOLN
INTRAMUSCULAR | Status: AC
  Administered 2015-05-31: 2 mg via INTRAVENOUS
  Filled 2015-05-31: qty 2

## 2015-05-31 MED ORDER — MIDAZOLAM HCL 2 MG/2ML IJ SOLN
2.0000 mg | Freq: Once | INTRAMUSCULAR | Status: AC
  Administered 2015-05-31: 2 mg via INTRAVENOUS

## 2015-05-31 MED ORDER — DILTIAZEM HCL ER COATED BEADS 120 MG PO CP24
120.0000 mg | ORAL_CAPSULE | Freq: Every day | ORAL | Status: DC
  Administered 2015-05-31 – 2015-06-08 (×9): 120 mg via ORAL
  Filled 2015-05-31 (×9): qty 1

## 2015-05-31 MED ORDER — DEXMEDETOMIDINE HCL IN NACL 400 MCG/100ML IV SOLN
0.4000 ug/kg/h | INTRAVENOUS | Status: AC
  Administered 2015-05-31: 0.4 ug/kg/h via INTRAVENOUS
  Administered 2015-06-01: 0.9 ug/kg/h via INTRAVENOUS
  Filled 2015-05-31 (×3): qty 100

## 2015-05-31 NOTE — Progress Notes (Signed)
70 yr old s/p incarcerated ventral hernia, extubated yesterday.  Much improved today from a mental status standpoint able to ask questions and answered appropriately. Patient states that he is very thirsty and would like to try working with speech therapy today. Patient does state that he is having some pain in his abdomen but states it is much better than it was when he initially came in.  Filed Vitals:   05/31/15 1700 05/31/15 1800  BP: 133/77 130/90  Pulse: 100   Temp:    Resp: 30 28   I/O last 3 completed shifts: In: 1685 [P.O.:60; Other:60; NG/GT:1165; IV Piggyback:400] Out: 560 [Urine:550; Drains:10] Total I/O In: 16.1 [I.V.:16.1] Out: -    PE:  Gen: NAD Res: crackles in bases Cardio: rRR [id number], wound clean and dry, JP drain with serous drainage Ext: 2+ pulses, no edema  CBC Latest Ref Rng 05/28/2015 05/27/2015 05/25/2015  WBC 3.8 - 10.6 K/uL 6.1 6.8 6.4  Hemoglobin 13.0 - 18.0 g/dL 11.1(L) 12.0(L) 12.0(L)  Hematocrit 40.0 - 52.0 % 32.8(L) 36.3(L) 35.5(L)  Platelets 150 - 440 K/uL 206 195 110(L)   CMP Latest Ref Rng 05/31/2015 05/30/2015 05/29/2015  Glucose 65 - 99 mg/dL 168(H) 103(H) 192(H)  BUN 6 - 20 mg/dL 30(H) 21(H) 20  Creatinine 0.61 - 1.24 mg/dL 0.83 0.75 0.82  Sodium 135 - 145 mmol/L 145 143 140  Potassium 3.5 - 5.1 mmol/L 3.5 3.6 3.4(L)  Chloride 101 - 111 mmol/L 108 106 103  CO2 22 - 32 mmol/L 27 30 31   Calcium 8.9 - 10.3 mg/dL 8.0(L) 8.0(L) 7.7(L)    A/P: Still some confusion, but abomen improving.  Will likey d/c JP drain tomorrow. Continue tube feeds at goal and attempts to work with speech therapy can advance his diet as they see fit. Continue working with physical therapy as she can. Patient could likely be transferred to an LTAC or to a SNF soon.

## 2015-05-31 NOTE — Progress Notes (Signed)
[facility] and [place], [place] spoke with Dr. Azalee Course via [place] nurse and  Dr. Azalee Course gave order to discontinue NG tube.

## 2015-05-31 NOTE — Progress Notes (Signed)
Patient rested on and off during shift. Confused conversation.  Agitated at times and restless. Sinus arrythmia and sinus tach per cardiac monitor. No respiratory distress noted. Sister visited this afternoon. Clear liquid diet ordered but patient has been too drowsy this shift to give clears.

## 2015-05-31 NOTE — Progress Notes (Signed)
Physical Therapy Treatment Patient Details Name: Brady Smith MRN: [id number] DOB: January 03, 1946 Today's Date: 05/31/2015    History of Present Illness presented to ER secondary to worsening abdominal hernia (previous recommended for follow up at tertiary care center); admitted with SBO related to incarcerated ventral hernia.  Status post exploratory laparotomy with small bowel resection (2/25); post-op course complicated by post-op shock, ETOH withdrawal, prolonged intubation (2/25-3/1, 3/2-3/5), afib rate-controlled and mild ileus.  Intermittent restless and agitated, requiring ativan/precedex for management.    PT Comments    Pt extremely lethargic; attempts verbalization, but mostly incomprehensible. Pt unable to keep eyes open and when does attempt unable to fully open. Supine bed exercises performed in very limited manner. Pt attempts initiation of movement after several cues/instruction on left in very limited range and for 5 out of 10 repetitions. PT grimaces with pain with right lower extremity passive range of motion and more so with left foot/ankle movement. Pt unable to demonstrate any active active assisted range on the right. Continue PT to progress participation in PT with range of motion, strength and hopeful progression to functional mobility.   Follow Up Recommendations  SNF     Equipment Recommendations       Recommendations for Other Services       Precautions / Restrictions Precautions Precautions: Fall Restrictions Weight Bearing Restrictions: No    Mobility  Bed Mobility               General bed mobility comments: Unable to attempt due to level of lethargy  Transfers                 General transfer comment: Unable due to level of lethargy  Ambulation/Gait             General Gait Details: uable to due to level of lethargy   Stairs            Wheelchair Mobility    Modified Rankin (Stroke Patients Only)       Balance                                     Cognition Arousal/Alertness: Lethargic Behavior During Therapy: Restless Overall Cognitive Status: Difficult to assess                      Exercises General Exercises - Lower Extremity Ankle Circles/Pumps: PROM;Left;10 reps;Supine (Moans with pain with attempt on R) Quad Sets:  (attempted; unable to follow instruction/perform) Short Arc Quad: AAROM;Left;10 reps;Supine (partial range AROM; PROM R with pain grimaces) Heel Slides: AAROM;Left;10 reps;Supine (limited range AROM; PROM R with pain grimaces) Hip ABduction/ADduction: AAROM;Left;10 reps;Supine (very limited AROM; PROM R pain grimace but less so)    General Comments General comments (skin integrity, edema, etc.): hand mitts      Pertinent Vitals/Pain Pain Assessment:  (moan with R foot, grimace with RLE moving/nods yes to pain) Pain Location: RLE Pain Intervention(s): Limited activity within patient's tolerance    Home Living                      Prior Function            PT Goals (current goals can now be found in the care plan section) Progress towards PT goals: Not progressing toward goals - comment    Frequency  Min 2X/week    PT Plan Current  plan remains appropriate    Co-evaluation             End of Session   Activity Tolerance: Patient limited by lethargy Patient left: in bed     Time: [id number] PT Time Calculation (min) (ACUTE ONLY): 19 min  Charges:  $Therapeutic Exercise: 8-22 mins                    G Codes:      Charlaine Dalton, PTA 05/31/2015, 12:11 PM

## 2015-05-31 NOTE — Progress Notes (Signed)
[facility] button pushed in room.  This RN and Sharyn Lull, RN notified Sarah, RN([facility] nurse) of patient being agitated and climbing over bed rails with increased heart rate. Judson Roch, RN to notify Dr. Milinda Hirschfeld for possible PRN order.

## 2015-05-31 NOTE — Progress Notes (Signed)
[facility] Progress Note Patient Name: Brady Smith DOB: 09/29/1945 MRN: [id number]   Date of Service  05/31/2015  HPI/Events of Note  Can recheck on patient at bedside nurse's request. Patient has increasing delirium and attempting to climb out of bed. Patient currently laying in bed recumbent with staff at bedside. Disoriented with inappropriate answers to questions.Suspect underlying delirium and patient has received IV Versed earlier today to help with delirium.  eICU Interventions  1. Start Precedex infusion 2. EKG to evaluate QTC for possible need of Haldol.     Intervention Category Major Interventions: Change in mental status - evaluation and management  Tera Partridge 05/31/2015, 7:12 PM

## 2015-05-31 NOTE — Progress Notes (Signed)
PULMONARY / CRITICAL CARE MEDICINE   Name: Brady Smith MRN: [id number] DOB: 1945-05-19    ADMISSION DATE:  05/17/2015 CONSULTATION DATE:  02/26  REFERRING MD:  Adonis Huguenin   Subjective Extubated yesterday, lethargic but arousable No acute issues overnight. onminimal oxygen support, weak cough reflex Plan to remove NG tube and start oral feeds Foley catheter removed, obtain PT consult Stop abx today   VITAL SIGNS: BP 171/77 mmHg  Pulse 121  Temp(Src) 97.6 F (36.4 C) (Oral)  Resp 31  Ht 5\' 8"  (1.727 m)  Wt 157 lb 13.6 oz (71.6 kg)  BMI 24.01 kg/m2  SpO2 96%   INTAKE / OUTPUT: I/O last 3 completed shifts: In: 1320 [Other:60; NG/GT:860; IV Piggyback:400] Out: 650 [Urine:650]   ROS limited due to lethargy  PHYSICAL EXAMINATION: General: NAD Neuro: Moves extremities, follows basic commands HEENT: NCAT Cardiovascular: RRR, S1/S2, no MRG Lungs: Bilateral airflow, mild expiratory wheezes, coarse rhonchi, diminished breath sounds bilaterally Abdomen: post op changes, abd soft, diminished to absent BS Ext: warm, no edema  LABS:  BMET  Recent Labs Lab 05/29/15 0822 05/30/15 0400 05/31/15 0440  NA 140 143 145  K 3.4* 3.6 3.5  CL 103 106 108  CO2 31 30 27   BUN 20 21* 30*  CREATININE 0.82 0.75 0.83  GLUCOSE 192* 103* 168*    Electrolytes  Recent Labs Lab 05/28/15 0918 05/29/15 0822 05/30/15 0400 05/31/15 0440  CALCIUM 7.4* 7.7* 8.0* 8.0*  MG 2.2 2.1 2.2  --   PHOS 3.6 3.1 5.3*  --     CBC  Recent Labs Lab 05/25/15 0411 05/27/15 1247 05/28/15 0918  WBC 6.4 6.8 6.1  HGB 12.0* 12.0* 11.1*  HCT 35.5* 36.3* 32.8*  PLT 110* 195 206    Coag's No results for input(s): APTT, INR in the last 168 hours.  Sepsis Markers No results for input(s): LATICACIDVEN, PROCALCITON, O2SATVEN in the last 168 hours.  ABG  Recent Labs Lab 05/24/15 1144 05/25/15 1544 05/28/15 1222  PHART 7.41 7.46* 7.44  PCO2ART 48 44 47  PO2ART 69* 84 78*    Liver  Enzymes No results for input(s): AST, ALT, ALKPHOS, BILITOT, ALBUMIN in the last 168 hours.  Cardiac Enzymes No results for input(s): TROPONINI, PROBNP in the last 168 hours.  Glucose  Recent Labs Lab 05/30/15 1705 05/30/15 2003 05/30/15 2331 05/31/15 0538 05/31/15 0745 05/31/15 1120  GLUCAP 148* 131* 182* 187* 133* 125*    Ct Head Wo Contrast  05/30/2015  CLINICAL DATA:  Acute encephalopathy. EXAM: CT HEAD WITHOUT CONTRAST TECHNIQUE: Contiguous axial images were obtained from the base of the skull through the vertex without intravenous contrast. COMPARISON:  None. FINDINGS: Moderate atrophy.  Negative for hydrocephalus. Mild chronic microvascular ischemic change in the white matter. No acute infarct. Negative for intracranial hemorrhage. No mass or edema. Mucosal edema in the paranasal sinuses. Air-fluid level sphenoid sinus. Mastoid sinus effusion bilaterally. No acute skull lesion. IMPRESSION: Atrophy and mild chronic microvascular ischemia. No acute intracranial abnormality. Sinusitis.  Bilateral mastoid sinus effusion. Electronically Signed   By: Franchot Gallo M.D.   On: 05/30/2015 18:13   MAJOR EVENTS/TEST RESULTS: VENT DAY #6 02/23 Admission 02/23 IM consult 02/25 Laparotomy, strangulated small bowel, small bowel resection 03/01 EXTUBATED 03/02 RE-INTUBATED 03/05 EXTUBATED  INDWELLING DEVICES:: ETT 02/25 >>   MICRO DATA: Urine 02/23 >> NEG MRSA PCR 02/25 >> NEG REsp culture-CITROBACTER SPECIES ANTIMICROBIALS:  Ertapenem 02/25 >>   Discussion 70 yo white male with acute s/p laparotomy for acute  small bowel infarction complicated by acute resp failure and metabolic encephalopathy and acute etoh withdrawal requiring vent support with CITROBACTER PNEUMONIA  ASSESSMENT / PLAN: PULMONARY-resp failure improved -extubated, aspiration precautions -oxygen as needed   GASTROINTESTINAL A:   Acute small bowel infarction S/P laparotomy -start oral feeds -follow gen  surgery recs  HEMATOLOGIC A:   Very mild thrombocytopenia-resolved P:  DVT px: LMWH Monitor CBC intermittently Transfuse per usual ICU guidelines  INFECTIOUS Citrobacter pneumonia -abx completed  ENDOCRINE A:   Mild hyperglycemia without prior dx of DM P:   Blood glucose monitoring every 4 hours with sliding scale insulin coverage per protocol   NEUROLOGIC A:   Heavy ETOH use Post op pain -sedatives as needed   Will assess to transfer to gen med floor in next 24 hrs Will sign off at this time. Please call back with any questions     The Patient requires high complexity decision making for assessment and support, frequent evaluation and titration of therapies.   Corrin Parker, M.D.  [facility]  Medical Director [place] Director [facility] Cardio-Pulmonary Department

## 2015-05-31 NOTE — Progress Notes (Signed)
Nutrition Follow-up     INTERVENTION:   Meals and Snacks: diet progression as tolerated Medical Food Supplement Therapy: if tolerates CL diet, recommend addition of nutritional supplement  NUTRITION DIAGNOSIS:   Inadequate oral intake related to acute illness, altered GI function as evidenced by NPO status. Being addressed as diet advanced  GOAL:   Provide needs based on ASPEN/SCCM guidelines  MONITOR:    (PN, Energy Intake, Anthropometrics, Electrolyte/Renal Profile, Glucose Profile)  REASON FOR ASSESSMENT:   Ventilator, Consult New TPN/TNA  ASSESSMENT:    Pt lethargic at times but arousable, follows commands but confused at times  Diet Order:  Diet clear liquid Room service appropriate?: Yes; Fluid consistency:: Thin   Energy Intake: no recorded po intake yet  Digestive System: pt pulled NG out   Recent Labs Lab 05/28/15 0918 05/29/15 0822 05/30/15 0400 05/31/15 0440  NA 138 140 143 145  K 3.6 3.4* 3.6 3.5  CL 102 103 106 108  CO2 31 31 30 27   BUN 23* 20 21* 30*  CREATININE 0.86 0.82 0.75 0.83  CALCIUM 7.4* 7.7* 8.0* 8.0*  MG 2.2 2.1 2.2  --   PHOS 3.6 3.1 5.3*  --   GLUCOSE 179* 192* 103* 168*    Glucose Profile:  Recent Labs  05/31/15 0538 05/31/15 0745 05/31/15 1120  GLUCAP 187* 133* 125*   Meds: ss novolog  Height:   Ht Readings from Last 1 Encounters:  05/18/15 5\' 8"  (1.727 m)    Weight:   Wt Readings from Last 1 Encounters:  05/31/15 157 lb 13.6 oz (71.6 kg)    BMI:  Body mass index is 24.01 kg/(m^2).  Estimated Nutritional Needs:   Kcal:  BEE: 1510kcals, TEE: (IF 1.1-1.3)(AF 1.2) 1993-2356kcals  Protein:  93-116g protein (1.2-1.5g/kg)  Fluid:  1925-2310 mL (25-30 ml/kg)   HIGH Care Level  Kerman Passey MS, RD, LDN [phone] Pager  [phone] Weekend/On-Call Pager

## 2015-05-31 NOTE — Progress Notes (Signed)
[place] INFECTIOUS DISEASE PROGRESS NOTE Date of Admission:  05/17/2015     ID: Brady Smith is a 70 y.o. male with fevers, s/p incarcerated hernia surgery, PNA  Principal Problem:   Small bowel obstruction (HCC) Active Problems:   Incarcerated ventral hernia   Essential hypertension, malignant   Urinary retention   UTI (urinary tract infection)   Acute respiratory failure (HCC)   ETOH abuse   Acute encephalopathy   Subjective: More alert, no fevers   ROS  Eleven systems are reviewed and negative except per hpi  Medications:  Antibiotics Given (last 72 hours)    Date/Time Action Medication Dose Rate   05/28/15 1510 Given   ciprofloxacin (CIPRO) IVPB 400 mg 400 mg 200 mL/hr   05/29/15 [id number] Given   ciprofloxacin (CIPRO) IVPB 400 mg 400 mg 200 mL/hr   05/29/15 1634 Given   ciprofloxacin (CIPRO) IVPB 400 mg 400 mg 200 mL/hr   05/30/15 [id number] Given   ciprofloxacin (CIPRO) IVPB 400 mg 400 mg 200 mL/hr   05/30/15 1652 Given   ciprofloxacin (CIPRO) IVPB 400 mg 400 mg 200 mL/hr   05/31/15 0439 Given   ciprofloxacin (CIPRO) IVPB 400 mg 400 mg 200 mL/hr     . antiseptic oral rinse  7 mL Mouth Rinse q12n4p  . budesonide (PULMICORT) nebulizer solution  0.5 mg Nebulization BID  . chlorhexidine  15 mL Mouth Rinse BID  . enoxaparin (LOVENOX) injection  40 mg Subcutaneous Q24H  . free water  200 mL Per Tube 3 times per day  . insulin aspart  1-3 Units Subcutaneous 6 times per day  . ipratropium-albuterol  3 mL Nebulization Q6H  . pantoprazole sodium  40 mg Per Tube Daily  . thiamine  100 mg Oral Daily    Objective: Vital signs in last 24 hours: Temp:  [97.6 F (36.4 C)-98.3 F (36.8 C)] 97.6 F (36.4 C) (03/08 0800) Pulse Rate:  [60-127] 118 (03/08 1200) Resp:  [17-37] 21 (03/08 1200) BP: (129-171)/(71-134) 139/86 mmHg (03/08 1200) SpO2:  [83 %-99 %] 96 % (03/08 1200) Weight:  [71.6 kg (157 lb 13.6 oz)] 71.6 kg (157 lb 13.6 oz) (03/08 0511) Constitutional: groggy but  arousable.  HENT: perrla,  Mouth/Throat: Oropharynx is clear and moist. No oropharyngeal exudate. NGT in place Cardiovascular: Normal rate, regular rhythm and normal heart sounds. Pulmonary/Chest: bil rhonchi  Abdominal: Soft. Bowel sounds are normal. Mild diffuse TTP, midline incision with staples but very clean and no drainage. JP drain LLQ qith ss drainage He has no cervical adenopathy.  Neurological: He is lethargic EXT no edema, RLE with cool to touch, some mottling  Skin: Skin is warm and dry. No rash noted. No erythema.  Psychiatric: lethargic  Access- Picc RUE, NGT, Foley   Lab Results  Recent Labs  05/30/15 0400 05/31/15 0440  NA 143 145  K 3.6 3.5  CL 106 108  CO2 30 27  BUN 21* 30*  CREATININE 0.75 0.83    Microbiology: Results for orders placed or performed during the hospital encounter of 05/17/15  Urine culture     Status: None   Collection Time: 05/18/15  9:21 AM  Result Value Ref Range Status   Specimen Description URINE, CATHETERIZED  Final   Special Requests Normal  Final   Culture NO GROWTH 1 DAY  Final   Report Status 05/19/2015 FINAL  Final  MRSA PCR Screening     Status: None   Collection Time: 05/20/15  5:08 PM  Result Value  Ref Range Status   MRSA by PCR NEGATIVE NEGATIVE Final    Comment:        The GeneXpert MRSA Assay (FDA approved for NASAL specimens only), is one component of a comprehensive MRSA colonization surveillance program. It is not intended to diagnose MRSA infection nor to guide or monitor treatment for MRSA infections.   Culture, respiratory (NON-Expectorated)     Status: None   Collection Time: 05/22/15 10:29 AM  Result Value Ref Range Status   Specimen Description TRACHEAL ASPIRATE  Final   Special Requests NONE  Final   Gram Stain   Final    FEW WBC SEEN RARE SQUAMOUS EPITHELIAL CELLS PRESENT NO ORGANISMS SEEN    Culture LIGHT GROWTH CITROBACTER BRAAKII  Final   Report Status 05/25/2015 FINAL  Final    Organism ID, Bacteria CITROBACTER BRAAKII  Final      Susceptibility   Citrobacter braakii - MIC*    CEFAZOLIN >=64 RESISTANT Resistant     CEFEPIME <=1 SENSITIVE Sensitive     CEFTAZIDIME <=1 SENSITIVE Sensitive     CEFTRIAXONE <=1 SENSITIVE Sensitive     CIPROFLOXACIN <=0.25 SENSITIVE Sensitive     GENTAMICIN <=1 SENSITIVE Sensitive     IMIPENEM 0.5 SENSITIVE Sensitive     TRIMETH/SULFA <=20 SENSITIVE Sensitive     PIP/TAZO <=4 SENSITIVE Sensitive     * LIGHT GROWTH CITROBACTER BRAAKII  CULTURE, BLOOD (ROUTINE X 2) w Reflex to PCR ID Panel     Status: None   Collection Time: 05/22/15 10:37 AM  Result Value Ref Range Status   Specimen Description BLOOD LT [facility]  Final   Special Requests BOTTLES DRAWN AEROBIC AND ANAEROBIC 7ML ANA 7MLAER  Final   Culture NO GROWTH 5 DAYS  Final   Report Status 05/27/2015 FINAL  Final  CULTURE, BLOOD (ROUTINE X 2) w Reflex to PCR ID Panel     Status: None   Collection Time: 05/22/15 10:50 AM  Result Value Ref Range Status   Specimen Description BLOOD RT AC  Final   Special Requests   Final    BOTTLES DRAWN AEROBIC AND ANAEROBIC 7ML ANA 7ML AER   Culture NO GROWTH 5 DAYS  Final   Report Status 05/27/2015 FINAL  Final  Urine culture     Status: None   Collection Time: 05/22/15 11:47 AM  Result Value Ref Range Status   Specimen Description URINE, RANDOM  Final   Special Requests NONE  Final   Culture NO GROWTH 1 DAY  Final   Report Status 05/23/2015 FINAL  Final  CULTURE, BLOOD (ROUTINE X 2) w Reflex to PCR ID Panel     Status: None (Preliminary result)   Collection Time: 05/27/15  1:26 PM  Result Value Ref Range Status   Specimen Description BLOOD RIGHT HAND  Final   Special Requests BOTTLES DRAWN AEROBIC AND ANAEROBIC 3CC  Final   Culture NO GROWTH 4 DAYS  Final   Report Status PENDING  Incomplete  CULTURE, BLOOD (ROUTINE X 2) w Reflex to PCR ID Panel     Status: None (Preliminary result)   Collection Time: 05/27/15  1:33 PM  Result Value Ref  Range Status   Specimen Description BLOOD LEFT HAND  Final   Special Requests BOTTLES DRAWN AEROBIC AND ANAEROBIC 5CC  Final   Culture NO GROWTH 4 DAYS  Final   Report Status PENDING  Incomplete     Studies/Results:  Dg Chest 1 View  05/22/2015  CLINICAL DATA:  [employer]  placement. EXAM: CHEST 1 VIEW COMPARISON:  Earlier today. FINDINGS: 1742 hours. Endotracheal tube terminates 4.7 cm above carina.Nasogastric tube terminates at the gastric body. A right-sided PICC line is new, and terminates at the low SVC versus cavoatrial junction. Midline trachea. Normal heart size for level of inspiration. No pleural effusion or pneumothorax. Low lung volumes with improved bibasilar airspace disease. IMPRESSION: Right-sided PICC line terminates at the low SVC versus superior caval/atrial junction. No pneumothorax. Improved bibasilar aeration, with mild atelectasis remaining. Electronically Signed   By: Abigail Miyamoto M.D.   On: 05/22/2015 17:59   Ct Head Wo Contrast  05/30/2015  CLINICAL DATA:  Acute encephalopathy. EXAM: CT HEAD WITHOUT CONTRAST TECHNIQUE: Contiguous axial images were obtained from the base of the skull through the vertex without intravenous contrast. COMPARISON:  None. FINDINGS: Moderate atrophy.  Negative for hydrocephalus. Mild chronic microvascular ischemic change in the white matter. No acute infarct. Negative for intracranial hemorrhage. No mass or edema. Mucosal edema in the paranasal sinuses. Air-fluid level sphenoid sinus. Mastoid sinus effusion bilaterally. No acute skull lesion. IMPRESSION: Atrophy and mild chronic microvascular ischemia. No acute intracranial abnormality. Sinusitis.  Bilateral mastoid sinus effusion. Electronically Signed   By: Franchot Gallo M.D.   On: 05/30/2015 18:13   Ct Abdomen Pelvis W Contrast  05/27/2015  CLINICAL DATA:  One week postop from bowel resection for bowel obstruction. Fever. Bladder carcinoma. EXAM: CT ABDOMEN AND PELVIS WITH CONTRAST TECHNIQUE:  Multidetector CT imaging of the abdomen and pelvis was performed using the standard protocol following bolus administration of intravenous contrast. CONTRAST:  179mL OMNIPAQUE IOHEXOL 350 MG/ML SOLN, 1 OMNIPAQUE IOHEXOL 240 MG/ML SOLN COMPARISON:  05/17/2015 FINDINGS: Lower chest: New small bilateral pleural effusions and bibasilar atelectasis demonstrated. Hepatobiliary: No masses or other significant abnormality. Prior cholecystectomy noted. No evidence of biliary dilatation. Pancreas: No mass, inflammatory changes, or other significant abnormality. Spleen: Within normal limits in size and appearance. Adrenals/Urinary Tract: No evidence of adrenal or renal masses. Tiny sub-cm right renal cyst remains stable. No evidence of hydronephrosis. Patient has undergone cystectomy with ileal loop diversion into pelvic neobladder. A Foley catheter is seen within the neobladder which is decompressed. Stomach/Bowel: Recent postsurgical changes are seen from repair of large ventral abdominal wall hernia. Mild wall thickening of the transverse colon is seen near the site of the hernia repair likely due to postop edema. No evidence of obstruction. There is no evidence of recurrent hernia. No evidence of abscess or free air. Vascular/Lymphatic: No pathologically enlarged lymph nodes. 3.0 cm infrarenal abdominal aortic aneurysm remains stable. No evidence of aneurysm leak or rupture. Reproductive: No mass or other significant abnormality. Other: None. Musculoskeletal:  No suspicious bone lesions identified. IMPRESSION: Expected postoperative changes from repair recent ventral abdominal wall hernia. Low residual transverse colonic wall thickening near the hernia site is likely due to postop edema. No evidence of bowel obstruction or abscess. New small bilateral pleural effusions and bibasilar atelectasis. Stable postop changes from previous cystectomy and ileal loop diversion into pelvic neobladder. No evidence of hydronephrosis. No  evidence of recurrent or metastatic carcinoma. Stable 3.0 cm infrarenal abdominal aortic aneurysm. Recommend followup by ultrasound in 3 years. This recommendation follows ACR consensus guidelines: White Paper of the ACR Incidental Findings Committee II on Vascular Findings. Natasha Mead Coll Radiol 2013; 10:789-794 Electronically Signed   By: Earle Gell M.D.   On: 05/27/2015 16:07   Ct Abdomen Pelvis W Contrast  05/17/2015  CLINICAL DATA:  Generalized abdominal and chest pain since this  afternoon. History bladder cancer. EXAM: CT ABDOMEN AND PELVIS WITH CONTRAST TECHNIQUE: Multidetector CT imaging of the abdomen and pelvis was performed using the standard protocol following bolus administration of intravenous contrast. CONTRAST:  19mL OMNIPAQUE IOHEXOL 300 MG/ML  SOLN COMPARISON:  07/27/2013.  04/12/2011 FINDINGS: Lower chest: Stable appearance of chronic right pleural thickening posteriorly. Hepatobiliary: No focal abnormality within the liver parenchyma. Gallbladder surgically absent. No intrahepatic or extrahepatic biliary dilation. Pancreas: No focal mass lesion. No dilatation of the main duct. No intraparenchymal cyst. No peripancreatic edema. Spleen: No splenomegaly. No focal mass lesion. Adrenals/Urinary Tract: Stable tiny bilateral adrenal nodules consistent with adenomas. Cortical scarring noted in the right kidney. No enhancing lesion in either kidney. Stable small right renal cysts. Left kidney is unremarkable. Mild right hydronephrosis is associated with right hydroureter with the right ureter reimplanted along the left anterior bladder wall. On previous exams, at this reimplanted ureter, potentially a conduit, had thin walls but demonstrates a thick enhancing wall on today's study. There is some mild wall thickening in the right aspect of the neobladder. Left ureter also appears to tiny into the conduit. Stomach/Bowel: Stomach is mildly distended. Duodenum is normally positioned as is the ligament of  Treitz. Multiple small bowel loops extend through a complex ventral hernia that also contains transverse colon. There is a dilated small bowel loop measuring up to 3.8 cm that tracks into the hernia with decompressed, non-opacified small bowel exiting the hernia sac. The terminal ileum is normal. The appendix is not visualized, but there is no edema or inflammation in the region of the cecum. Transverse colon extends into the hernia sac without proximal colonic dilatation. Diverticular change noted in the left colon without diverticulitis. Vascular/Lymphatic: Abdominal aorta measures up to 3.0 cm and diameter, consistent with aneurysm. Atherosclerotic calcification is associated in the abdominal aortic wall. There is no gastrohepatic or hepatoduodenal ligament lymphadenopathy. No intraperitoneal or retroperitoneal lymphadenopathy. No pelvic sidewall lymphadenopathy. Reproductive: Prostate gland appears to be surgically absent. Other: No intraperitoneal free fluid. Musculoskeletal: Bone windows reveal no worrisome lytic or sclerotic osseous lesions. Bilateral pars interarticularis defects are seen at L5. IMPRESSION: Large complex ventral hernia as seen on multiple previous imaging studies. Small bowel and colon is contained within the hernia sac and on today's study, and there is a distended contrast filled small bowel loop that appears to track into the hernia where multiple dilated small bowel loops are present. Small bowel leaving the hernia sac is decompressed and non-opacified. As such, imaging features are suspicious for incarcerated small bowel with obstruction related to the ventral hernia. Patient appears to be status post cystectomy with urinary diversion to a neobladder. There is some wall thickening in the urinary conduit as it ties into the neobladder, but this is nonspecific. On previous studies, the wall to this structure was very thin and the wall thickening today makes inflammation a concern. There  is also some new wall thickening associated with the right aspect of the neobladder. Stable tiny bilateral adrenal nodules, likely representing adenomas. Abdominal aortic atherosclerosis with 3.0 cm aneurysm, stable. Electronically Signed   By: Misty Stanley M.D.   On: 05/17/2015 23:32   Dg Chest Port 1 View  05/28/2015  CLINICAL DATA:  Acute respiratory failure, history bladder cancer, renal cancer, pneumonia, smoking EXAM: PORTABLE CHEST 1 VIEW COMPARISON:  Portable exam 0608 hours compared to 05/25/2015 FINDINGS: Tip of endotracheal tube projects 1.8 cm above carina. Nasogastric tube coiled in proximal stomach. RIGHT arm PICC line tip projects over  SVC. Upper normal heart size. Stable mediastinal contours and pulmonary vascularity. Perihilar to basilar infiltrates again identified question mild pulmonary edema. Bibasilar atelectasis. Cannot completely exclude small RIGHT pleural effusion. No pneumothorax. IMPRESSION: Question mild pulmonary edema with coexistent bibasilar atelectasis. Electronically Signed   By: Lavonia Dana M.D.   On: 05/28/2015 08:06   Portable Chest Xray  05/25/2015  CLINICAL DATA:  Hypoxia EXAM: PORTABLE CHEST 1 VIEW COMPARISON:  May 24, 2015 FINDINGS: Endotracheal tube tip is 2.5 cm above the carina. Central catheter tip is in the superior vena cava. Nasogastric tube tip and side port are in the stomach. No pneumothorax. There are small pleural effusions bilaterally with patchy bibasilar atelectasis. There is slight interstitial edema in the bases. Heart is borderline prominent with pulmonary vascularity within normal limits. No adenopathy evident. IMPRESSION: Tube and catheter positions as described without pneumothorax. Suspect a degree of congestive heart failure. There is patchy bibasilar atelectasis as well. No airspace consolidation evident. Electronically Signed   By: Lowella Grip III M.D.   On: 05/25/2015 09:14   Dg Chest Port 1 View  05/24/2015  CLINICAL DATA:  Acute  respiratory failure EXAM: PORTABLE CHEST 1 VIEW COMPARISON:  05/22/2015 FINDINGS: Right-sided PICC line, endotracheal tube and nasogastric catheter are all again seen and stable in appearance. Cardiac shadow is within normal limits. Increasing bibasilar atelectasis is noted. No bony abnormality is seen. IMPRESSION: Increasing bibasilar atelectasis. Electronically Signed   By: Inez Catalina M.D.   On: 05/24/2015 07:42   Dg Chest Port 1 View  05/22/2015  CLINICAL DATA:  Respiratory failure EXAM: PORTABLE CHEST 1 VIEW COMPARISON:  05/21/2015 FINDINGS: Endotracheal tube and NG tube are unchanged. Stable cardiac silhouette. There is increase in bibasilar opacities. Upper lungs are clear. IMPRESSION: 1. Stable support apparatus. 2. Increased bibasilar linear opacities representing atelectasis versus infiltrate. Electronically Signed   By: Suzy Bouchard M.D.   On: 05/22/2015 07:28   Dg Chest Port 1 View  05/21/2015  CLINICAL DATA:  Intubated, smoker EXAM: PORTABLE CHEST 1 VIEW COMPARISON:  05/20/2015 FINDINGS: Endotracheal tube is 5 cm above the carina. NG tube is in the stomach. The heart is borderline in size. Bibasilar atelectasis or infiltrates, right greater than left. Possible small right effusion. IMPRESSION: Bibasilar atelectasis or infiltrates, right greater than left. Small right effusion. Electronically Signed   By: Rolm Baptise M.D.   On: 05/21/2015 08:13   Dg Chest Port 1 View  05/20/2015  CLINICAL DATA:  Acute onset of cough.  Initial encounter. EXAM: PORTABLE CHEST 1 VIEW COMPARISON:  Chest radiograph from 07/14/2014 FINDINGS: The lungs are well-aerated. Peribronchial thickening is noted. Mild bibasilar opacities may reflect mild interstitial edema or possibly pneumonia. There is no evidence of pleural effusion or pneumothorax. The cardiomediastinal silhouette is within normal limits. No acute osseous abnormalities are seen. IMPRESSION: Peribronchial thickening noted. Mild bibasilar airspace  opacities may reflect mild interstitial edema or possibly pneumonia. Electronically Signed   By: Garald Balding M.D.   On: 05/20/2015 05:00   Dg Abd 2 Views  05/20/2015  CLINICAL DATA:  70 year old male -followup small bowel obstruction. EXAM: ABDOMEN - 2 VIEW COMPARISON:  05/19/2015 and prior radiographs.  05/17/2015 CT FINDINGS: Dilated small bowel loops within the abdomen again noted. Gas and stool in the colon again noted. Cholecystectomy clips again identified. IMPRESSION: Little significant change in dilated small bowel loops. Electronically Signed   By: Margarette Canada M.D.   On: 05/20/2015 09:59   Dg Abd 2 Views  05/19/2015  CLINICAL DATA:  Ventral hernia with obstruction EXAM: ABDOMEN - 2 VIEW COMPARISON:  05/18/2015 FINDINGS: NG tube has been removed. Again noted gaseous distended small bowel loops mid abdomen suspicious for ileus or partial bowel obstruction. Postcholecystectomy surgical clips. Moderate stool and gas noted in right colon and splenic flexure of the colon. Surgical clips are noted within pelvis. No evidence of free abdominal air. IMPRESSION: Again noted gaseous distended small bowel loops mid abdomen suspicious for ileus or partial bowel obstruction. Postcholecystectomy surgical clips. Moderate stool and gas noted in right colon and splenic flexure of the colon. Electronically Signed   By: Lahoma Crocker M.D.   On: 05/19/2015 08:18   Dg Abd Portable 2v  05/18/2015  CLINICAL DATA:  Patient with generalized abdominal pain. Abdominal discomfort. EXAM: PORTABLE ABDOMEN - 2 VIEW COMPARISON:  CT abdomen pelvis 05/17/2015 FINDINGS: Enteric tube tip and side-port project over the stomach. Cholecystectomy clips. Lung bases are clear. There multiple prominent gaseous distended loops of small bowel within the central abdomen measuring up to 4.3 cm. Stool and gas demonstrated within the colon. Pelvic surgical clips. Lumbar spine degenerative changes. IMPRESSION: Enteric tube tip and side-port project  over the stomach. Multiple gaseous distended loops of small bowel within the central abdomen concerning for early and/or partial bowel obstruction. Electronically Signed   By: Lovey Newcomer M.D.   On: 05/18/2015 11:19    Assessment/Plan: MAHAD CRILLY is a 70 y.o. male with complicated post op course after incarcerated ventral hernia, with persistent fevers but nml wbc. Only + cx is sputum with citrobacter. S/p 7 days ertapenem and now on cipro. CT abd neg for abscess. Some concern of ETOH and wd but is well into hospitalization  Possible sources include abd, PNA, sinusitis with NGT in place, picc line infection.  Clincially improving, last fever 3/6.  HD stable.  Recommendations Stop cipro 3/8 follow off abx.  Monitor for increasing fevers or wbc off abx Thank you very much for the consult.  I will sign off but please call if new fevers or leukocytosis.  Blackford, Ethel   05/31/2015, 2:11 PM

## 2015-05-31 NOTE — Progress Notes (Signed)
Patient is agitated and restless, trying to pull mitts off, and attempting to get out of bed.  Attempts to verbally redirect were unsuccessful.  Patient has PO Valium scheduled PRN for bedtime but it was given during the day time around 1700.  Spoke with Dr. Jannifer Franklin, gave verbal order to give another dose of Valium and see if there is improvement.  Will continue to monitor.

## 2015-05-31 NOTE — Progress Notes (Signed)
[place] NOTE  Pharmacy Consult for electrolytes and glucose management  Indication: Tube Feeds/ICU Status   No Known Allergies  Patient Measurements: Height: 5\' 8"  (172.7 cm) Weight: 157 lb 13.6 oz (71.6 kg) IBW/kg (Calculated) : 68.4  Vital Signs: Temp: 97.6 F (36.4 C) (03/08 0800) Temp Source: Oral (03/08 0800) BP: 139/86 mmHg (03/08 1200) Pulse Rate: 118 (03/08 1200) Intake/Output from previous day: 03/07 0701 - 03/08 0700 In: 1265 [NG/GT:805; IV Piggyback:400] Out: 550 [Urine:550] Intake/Output from this shift: Total I/O In: 305 [NG/GT:305] Out: -   Labs:  Recent Labs  05/29/15 0822 05/30/15 0400 05/31/15 0440  CREATININE 0.82 0.75 0.83  MG 2.1 2.2  --   PHOS 3.1 5.3*  --    Estimated Creatinine Clearance: 81.3 mL/min (by C-G formula based on Cr of 0.83).   Recent Labs  05/31/15 0538 05/31/15 0745 05/31/15 1120  GLUCAP 187* 133* 125*     Assessment: Pharmacy consulted for electrolytes and glucose management for 70 yo male s/p incarcerated hernia repair. Patient previously on TPN now on tube feeds at 64mL/hr.    Plan:  1. Electrolytes: No replacement warranted at this time. Pharmacy will continue to monitor and adjust per consult.    2. Glucose: Continue SSI. Will continue to monitor.    Pharmacy will continue to monitor and adjust per consult.   Ulice Dash D 05/31/2015,1:25 PM

## 2015-05-31 NOTE — Clinical Social Work Note (Signed)
Clinical Social Work Assessment  Patient Details  Name: Brady Smith MRN: [id number] Date of Birth: 04-26-1945  Date of referral:  05/31/15               Reason for consult:  Facility Placement                Permission sought to share information with:    Permission granted to share information::     Name::        Agency::     Relationship::     Contact Information:     Housing/Transportation Living arrangements for the past 2 months:  Homeless Source of Information:   (sibling) Patient Interpreter Needed:  None Criminal Activity/Legal Involvement Pertinent to Current Situation/Hospitalization:  No - Comment as needed Significant Relationships:  Siblings Lives with:  Self Do you feel safe going back to the place where you live?    Need for family participation in patient care:     Care giving concerns:  Patient has been residing out of his car and sometimes rents a room in a boarding house.   Social Worker assessment / plan:  PT has recommended rehab for patient. Patient is confused and unable to contribute to assessment at this time. Patient's brother: Brady Smith: [phone]. Brady Smith states that patient has had a difficult life and that he has a gambling problem and blows all of his money on gambling and thus has to live in his car a lot. Brady Smith is in agreement to do a bedsearch.   Employment status:  Retired Nurse, adult PT Recommendations:  [place] / Referral to community resources:  [place]  Patient/Family's Response to care:  Patient's brother expressed appreciation for CSW assistance.  Patient/Family's Understanding of and Emotional Response to Diagnosis, Current Treatment, and Prognosis:  Patient's brother has acknowledged that patient has had a difficult life and has continued to have a gambling problem but down played the ETOH abuse.   Emotional Assessment Appearance:  Appears older  than stated age Attitude/Demeanor/Rapport:  Unable to Assess Affect (typically observed):  Unable to Assess Orientation:  Oriented to Self Alcohol / Substance use:  Alcohol Use Psych involvement (Current and /or in the community):  No (Comment)  Discharge Needs  Concerns to be addressed:  Care Coordination Readmission within the last 30 days:  No Current discharge risk:  None Barriers to Discharge:  No Barriers Identified   Brady Leff, LCSW 05/31/2015, 5:37 PM

## 2015-05-31 NOTE — Progress Notes (Signed)
Patient continues to be agitated after taking NG tube out.  Patient trying to get out of bed, combative towards nurse.  RN made Dr. Mortimer Fries aware of the above mentioned and MD gave order for 2mg  of versed.

## 2015-05-31 NOTE — Clinical Social Work Note (Signed)
Patient unable to contribute to CSW assessment and thus, CSW has left a message for patient's brother: Winsor Ess: [phone] to return call to discuss discharge planning. PT has assessed and recommended SNF however currently patient is not able to participate well with PT at this time. Shela Leff MSW,LCSW [phone]

## 2015-05-31 NOTE — NC FL2 (Signed)
[place] LEVEL OF CARE SCREENING TOOL     IDENTIFICATION  Patient Name: Brady Smith Birthdate: September 10, 1945 Sex: male Admission Date (Current Location): 05/17/2015  [place] and [place] Number:  Engineering geologist and Address:  [facility], [address] [place], [place] [address]      Provider Number: [id number]  Attending Physician Name and Address:  Clayburn Pert, MD  Relative Name and Phone Number:       Current Level of Care: Hospital Recommended Level of Care: [place] Prior Approval Number:    Date Approved/Denied:   PASRR Number: [id number]  Discharge Plan: SNF    Current Diagnoses: Patient Active Problem List   Diagnosis Date Noted  . Acute respiratory failure ([place]) 05/23/2015  . ETOH abuse 05/23/2015  . Acute encephalopathy 05/23/2015  . Small bowel obstruction ([place]) 05/18/2015  . Incarcerated ventral hernia 05/18/2015  . Essential hypertension, malignant 05/18/2015  . Urinary retention 05/18/2015  . UTI (urinary tract infection) 05/18/2015  . Choroidal nevus, right eye 02/08/2015  . Carpal tunnel syndrome 01/13/2015  . H/O gastrointestinal disease 01/13/2015  . Phobia 01/13/2015  . History of primary malignant neoplasm of urinary bladder 01/13/2015  . Compulsive tobacco user syndrome 01/13/2015  . Familial multiple lipoprotein-type hyperlipidemia 01/13/2015  . COPD (chronic obstructive pulmonary disease) ([place]) 06/11/2011  . Smoker 06/11/2011  . Pulmonary nodule 06/11/2011    Orientation RESPIRATION BLADDER Height & Weight     Self  Normal, O2 (3 liters) Incontinent Weight: 157 lb 13.6 oz (71.6 kg) Height:  5\' 8"  (172.7 cm)  BEHAVIORAL SYMPTOMS/MOOD NEUROLOGICAL BOWEL NUTRITION STATUS   (none)  (none) Incontinent Diet (clear liquid)  AMBULATORY STATUS COMMUNICATION OF NEEDS Skin   Total Care Verbally Surgical wounds                       Personal Care Assistance Level of  Assistance  Total care       Total Care Assistance: Maximum assistance   Functional Limitations Info   (none)          SPECIAL CARE FACTORS FREQUENCY  PT (By licensed PT), OT (By licensed OT)                    Contractures Contractures Info: Not present    Additional Factors Info  Allergies, Code Status Code Status Info: FULL Allergies Info: NKA           Current Medications (05/31/2015):  This is the current hospital active medication list Current Facility-Administered Medications  Medication Dose Route Frequency Provider Last Rate Last Dose  . acetaminophen (TYLENOL) solution 650 mg  650 mg Per Tube Q6H PRN Loletha Grayer, MD   650 mg at 05/28/15 1645  . acetaminophen (TYLENOL) suppository 650 mg  650 mg Rectal Q4H PRN Jules Husbands, MD   650 mg at 05/28/15 2332  . albuterol (PROVENTIL) (2.5 MG/3ML) 0.083% nebulizer solution 2.5 mg  2.5 mg Nebulization Q3H PRN Wilhelmina Mcardle, MD   2.5 mg at 05/30/15 0836  . antiseptic oral rinse (CPC / CETYLPYRIDINIUM CHLORIDE 0.05%) solution 7 mL  7 mL Mouth Rinse q12n4p Flora Lipps, MD   7 mL at 05/30/15 1600  . budesonide (PULMICORT) nebulizer solution 0.5 mg  0.5 mg Nebulization BID Flora Lipps, MD   0.5 mg at 05/31/15 0831  . chlorhexidine (PERIDEX) 0.12 % solution 15 mL  15 mL Mouth Rinse BID Flora Lipps, MD  15 mL at 05/31/15 0917  . diazepam (VALIUM) tablet 2 mg  2 mg Oral QHS PRN Flora Lipps, MD   2 mg at 05/31/15 0057  . enoxaparin (LOVENOX) injection 40 mg  40 mg Subcutaneous Q24H Flora Lipps, MD   40 mg at 05/31/15 0918  . feeding supplement (VITAL 1.5 CAL) liquid 1,000 mL  1,000 mL Per Tube Continuous Hubbard Robinson, MD 55 mL/hr at 05/30/15 2000 1,000 mL at 05/30/15 2000  . free water 200 mL  200 mL Per Tube 3 times per day Hubbard Robinson, MD   200 mL at 05/31/15 0600  . hydrALAZINE (APRESOLINE) injection 10-40 mg  10-40 mg Intravenous Q4H PRN Wilhelmina Mcardle, MD      . insulin aspart (novoLOG) injection 1-3  Units  1-3 Units Subcutaneous 6 times per day Charlett Nose, [facility]   1 Units at 05/31/15 0810  . ipratropium-albuterol (DUONEB) 0.5-2.5 (3) MG/3ML nebulizer solution 3 mL  3 mL Nebulization Q6H Wilhelmina Mcardle, MD   3 mL at 05/31/15 1401  . ondansetron (ZOFRAN-ODT) disintegrating tablet 4 mg  4 mg Oral Q6H PRN Clayburn Pert, MD       Or  . ondansetron [facility]) injection 4 mg  4 mg Intravenous Q6H PRN Clayburn Pert, MD      . pantoprazole sodium (PROTONIX) 40 mg/20 mL oral suspension 40 mg  40 mg Per Tube Daily Flora Lipps, MD   40 mg at 05/31/15 0917  . thiamine (VITAMIN B-1) tablet 100 mg  100 mg Oral Daily Flora Lipps, MD   100 mg at 05/31/15 [id number]     Discharge Medications: Please see discharge summary for a list of discharge medications.  Relevant Imaging Results:  Relevant Lab Results:   Additional Information SS: [id number]  Shela Leff, LCSW

## 2015-05-31 NOTE — Progress Notes (Signed)
Patient pulled out NG tube and diet order has not been placed. RN spoke with Dr. Geoffry Paradise OR nurse and via nurse clear liquid diet was ordered by MD.

## 2015-05-31 NOTE — Progress Notes (Signed)
Dr. Milinda Hirschfeld cameraed into room and assessed patient and stated that he was going to order precedex drip. Report given to Sharyn Lull, RN who is now taking over patient's care.

## 2015-05-31 NOTE — Progress Notes (Signed)
Speech Language Pathology Treatment: Dysphagia  Patient Details Name: Brady Smith MRN: [id number] DOB: June 25, 1945 Today's Date: 05/31/2015 Time: 0901-1001 SLP Time Calculation (min) (ACUTE ONLY): 60 min  Assessment / Plan / Recommendation Clinical Impression  Pt appears at increased risk for aspiration at this time sec. to decreased alertness for follow through w/ tasks of oral intake, Confusion in general, and drowsiness. Unable to fully assess degree of oropharyngeal phase risk for aspiration sec. to the presence of the NG and min.+ pharyngeal secretions. Pt exhibited a delayed throat clearing x2 w/ straw trials of thin liquids(water) - approx. 4 ozs; none noted w/ the 1/2 tsp of puree. Of note, no decline in O2 sats (93-95%, RR or HR). Suspect pt's NG and pharyngeal secretions play a part in his presentation w/ po trials as well as his behavior and drowsiness. Pt was also orally intubated for an extended time frame. As trials continued, pt exhibited refusal behaviors when offered further po's.  MDs consulted re: option of removing NG to allow pt to initiate a po diet of thin liquids, possibly purees per MD ok sec. To GI status; strict aspiration precautions and feeding assistance w/ max. verbal/tactile cues as needed for follow through w/ tasks. Rec. nutritional supplement; meds in puree - crushed as able. Rec. ST f/u for ongoing assessment of pt's swallow function and safe toleration of an oral diet; trials to upgrade as appropriate. NSG and Dietician updated.    HPI HPI: Pt is a 70 y.o. male w/ h/o kidney and bladder Ca, COPD smoking ~2 packs of cigarettes daily(currenlty), pneumonia, and other medical issues who presents to emergency department for a worsening abdominal hernia. Patient states that after eating a large volume of corn last night, he started having some nausea followed by swelling in his midline and a new area of "bump". He's had a known history of a very large midline hernia and this  is his fourth presentation to our group for this identical complaint. Patient states that he was told that the last 2 visits that he should seek his hernia repair but he did not follow up with this advice. Patient currently only complaining of abdominal pain at the site of his hernia. He denies any current nausea, vomiting, chest pain, shortness of breath. He has been passing flatus and his last bowel movement was earlier today albeit small per the patient. Patient admits to smoking 2 packs of cigarettes per day. Patient also states that he drinks one to 2 cans of beer per day as well. During this hospitalization, pt had bowel surgery and remained orally intubated ~1 week d/t respiratory status. He had a bowel resection over a week ago for an incarcerated ventral hernia with necrotic bowel. He had a prolonged postoperative course secondary to alcohol withdrawals and has just come off the ventilator this past weekend. He remains very lethargic and is unable to provide any history and all of the history is obtained from the previous medical record. It was noted that his right foot did not have palpable pedal pulses and was slightly cooler than the left. He does not have any open ulcerations, infection, gangrenous changes, or nonviable tissue of the right foot that I can see. He is unable to provide any history in terms of whether or not he has claudication or describes any pain. The nurses have used the appropriate padding devices on the right heel to avoid any pressure ulcerations. He does have a palpable pulse in the left foot although  it is not prominent. Continues w/ the NG in place along w/ TPN for nutritional support. Pt is drowsy; weaned from any meds per MD. Mumbled speech per NSG and requesting "water" sometimes. NG in place for tx. Mod-max cues still required for follow through w/ tasks of eating/drinking during tx session d/t confusion.       SLP Plan  Continue with current plan of care      Recommendations  Diet recommendations: Thin liquid;Dysphagia 1 (puree) (TBD per MD d/t GI status) Liquids provided via: Cup;Straw Medication Administration: Crushed with puree Supervision: Full supervision/cueing for compensatory strategies;Trained caregiver to feed patient Compensations: Minimize environmental distractions;Slow rate;Small sips/bites;Follow solids with liquid (check for oral clearing) Postural Changes and/or Swallow Maneuvers: Seated upright 90 degrees;Upright 30-60 min after meal             General recommendations:  (Dietician) Oral Care Recommendations: Oral care BID;Staff/trained caregiver to provide oral care Follow up Recommendations: Skilled Nursing facility Plan: Continue with current plan of care     [place], [place], CCC-SLP  Tenya Araque 05/31/2015, 2:56 PM

## 2015-05-31 NOTE — Progress Notes (Signed)
Patient ID: Brady Smith, male   DOB: 04-03-1945, 70 y.o.   MRN: [id number] [facility] PROGRESS NOTE  Brady Smith [id number] DOB: 1945/04/20 DOA: 05/17/2015 PCP: Halina Maidens, MD  HPI/Subjective: Patient is asking where his bank card is and his cell phone. He is able to straight leg raise to command and lift his arms to command. He answers some questions. He is still difficult to understand.  Objective: Filed Vitals:   05/31/15 1300 05/31/15 1400  BP: 153/81 151/87  Pulse: 97 122  Temp:    Resp: 24 23    Filed Weights   05/29/15 0230 05/30/15 0500 05/31/15 0511  Weight: 77.6 kg (171 lb 1.2 oz) 72.6 kg (160 lb 0.9 oz) 71.6 kg (157 lb 13.6 oz)    ROS: Review of Systems  Respiratory: Positive for shortness of breath. Negative for cough.   Cardiovascular: Negative for chest pain.  Gastrointestinal: Negative for abdominal pain.  Neurological: Negative for headaches.   Limited secondary to altered mental status Exam: Physical Exam  HENT:  Nose: No mucosal edema.  Mouth/Throat: No oropharyngeal exudate or posterior oropharyngeal edema.  Eyes: Conjunctivae and lids are normal. Pupils are equal, round, and reactive to light.  Neck: No JVD present. Carotid bruit is not present. No edema present. No thyroid mass and no thyromegaly present.  Cardiovascular: S1 normal, S2 normal and normal heart sounds.  Exam reveals no gallop.   No murmur heard. Pulses:      Dorsalis pedis pulses are 0 on the right side, and 1+ on the left side.  Respiratory: No respiratory distress. He has no wheezes. He has rhonchi in the right lower field and the left lower field. He has no rales.  GI: Soft. Bowel sounds are normal. He exhibits no distension. There is no tenderness.  Musculoskeletal:       Right ankle: He exhibits no swelling.       Left ankle: He exhibits no swelling.  Lymphadenopathy:    He has no cervical adenopathy.  Neurological: He is alert.  Able to lift up arms up  off the bed  Skin: Skin is warm. Nails show no clubbing.  Right foot cool to touch  Psychiatric: His speech is slurred. He is slowed.      Data Reviewed: Basic Metabolic Panel:  Recent Labs Lab 05/26/15 0455 05/27/15 0331 05/28/15 0918 05/29/15 0822 05/30/15 0400 05/31/15 0440  NA 141 141 138 140 143 145  K 4.0 3.7 3.6 3.4* 3.6 3.5  CL 106 106 102 103 106 108  CO2 30 32 31 31 30 27   GLUCOSE 145* 151* 179* 192* 103* 168*  BUN 23* 24* 23* 20 21* 30*  CREATININE 0.88 0.80 0.86 0.82 0.75 0.83  CALCIUM 7.3* 7.4* 7.4* 7.7* 8.0* 8.0*  MG 2.2 2.1 2.2 2.1 2.2  --   PHOS 2.9 3.5 3.6 3.1 5.3*  --    CBC:  Recent Labs Lab 05/25/15 0411 05/27/15 1247 05/28/15 0918  WBC 6.4 6.8 6.1  NEUTROABS  --  6.0  --   HGB 12.0* 12.0* 11.1*  HCT 35.5* 36.3* 32.8*  MCV 94.4 97.6 94.0  PLT 110* 195 206    CBG:  Recent Labs Lab 05/30/15 2003 05/30/15 2331 05/31/15 0538 05/31/15 0745 05/31/15 1120  GLUCAP 131* 182* 187* 133* 125*    Recent Results (from the past 240 hour(s))  Culture, respiratory (NON-Expectorated)     Status: None   Collection Time: 05/22/15 10:29 AM  Result Value Ref  Range Status   Specimen Description TRACHEAL ASPIRATE  Final   Special Requests NONE  Final   Gram Stain   Final    FEW WBC SEEN RARE SQUAMOUS EPITHELIAL CELLS PRESENT NO ORGANISMS SEEN    Culture LIGHT GROWTH CITROBACTER BRAAKII  Final   Report Status 05/25/2015 FINAL  Final   Organism ID, Bacteria CITROBACTER BRAAKII  Final      Susceptibility   Citrobacter braakii - MIC*    CEFAZOLIN >=64 RESISTANT Resistant     CEFEPIME <=1 SENSITIVE Sensitive     CEFTAZIDIME <=1 SENSITIVE Sensitive     CEFTRIAXONE <=1 SENSITIVE Sensitive     CIPROFLOXACIN <=0.25 SENSITIVE Sensitive     GENTAMICIN <=1 SENSITIVE Sensitive     IMIPENEM 0.5 SENSITIVE Sensitive     TRIMETH/SULFA <=20 SENSITIVE Sensitive     PIP/TAZO <=4 SENSITIVE Sensitive     * LIGHT GROWTH CITROBACTER BRAAKII  CULTURE, BLOOD  (ROUTINE X 2) w Reflex to PCR ID Panel     Status: None   Collection Time: 05/22/15 10:37 AM  Result Value Ref Range Status   Specimen Description BLOOD LT [facility]  Final   Special Requests BOTTLES DRAWN AEROBIC AND ANAEROBIC 7ML ANA 7MLAER  Final   Culture NO GROWTH 5 DAYS  Final   Report Status 05/27/2015 FINAL  Final  CULTURE, BLOOD (ROUTINE X 2) w Reflex to PCR ID Panel     Status: None   Collection Time: 05/22/15 10:50 AM  Result Value Ref Range Status   Specimen Description BLOOD RT AC  Final   Special Requests   Final    BOTTLES DRAWN AEROBIC AND ANAEROBIC 7ML ANA 7ML AER   Culture NO GROWTH 5 DAYS  Final   Report Status 05/27/2015 FINAL  Final  Urine culture     Status: None   Collection Time: 05/22/15 11:47 AM  Result Value Ref Range Status   Specimen Description URINE, RANDOM  Final   Special Requests NONE  Final   Culture NO GROWTH 1 DAY  Final   Report Status 05/23/2015 FINAL  Final  CULTURE, BLOOD (ROUTINE X 2) w Reflex to PCR ID Panel     Status: None (Preliminary result)   Collection Time: 05/27/15  1:26 PM  Result Value Ref Range Status   Specimen Description BLOOD RIGHT HAND  Final   Special Requests BOTTLES DRAWN AEROBIC AND ANAEROBIC 3CC  Final   Culture NO GROWTH 4 DAYS  Final   Report Status PENDING  Incomplete  CULTURE, BLOOD (ROUTINE X 2) w Reflex to PCR ID Panel     Status: None (Preliminary result)   Collection Time: 05/27/15  1:33 PM  Result Value Ref Range Status   Specimen Description BLOOD LEFT HAND  Final   Special Requests BOTTLES DRAWN AEROBIC AND ANAEROBIC 5CC  Final   Culture NO GROWTH 4 DAYS  Final   Report Status PENDING  Incomplete     Scheduled Meds: . antiseptic oral rinse  7 mL Mouth Rinse q12n4p  . budesonide (PULMICORT) nebulizer solution  0.5 mg Nebulization BID  . chlorhexidine  15 mL Mouth Rinse BID  . enoxaparin (LOVENOX) injection  40 mg Subcutaneous Q24H  . free water  200 mL Per Tube 3 times per day  . insulin aspart  1-3 Units  Subcutaneous 6 times per day  . ipratropium-albuterol  3 mL Nebulization Q6H  . pantoprazole sodium  40 mg Per Tube Daily  . thiamine  100 mg Oral Daily  Continuous Infusions: . feeding supplement (VITAL 1.5 CAL) 1,000 mL (05/30/15 2000)    Assessment/Plan:  1. Acute encephalopathy - likely from long-standing alcoholism and alcohol withdrawal. CT scan of the head was negative. Still waiting for the patient to swallow better.  2. Drug fever to invanz. Fever curve trended better. Now off antibiotics 3. Incarcerated ventral hernia. Status post removal of necrotic bowel and incarcerated hernia repair. Speech pathologist wanted the NG tube out to see if the patient is able to swallow. Patient placed on clear liquid diet 4. Alcohol withdrawal. Valium made when necessary at night. 5. Acute respiratory failure with hypoxia. Patient was reintubated 05/24/2015 secondary to encephalopathy. Patient extubated 05/28/2015. Currently breathing comfortably on nasal cannula. Nebulizer treatments 6. Atrial fibrillation. Start Cardizem CD. Hold anticoagulation at this point. 7. History of COPD on  Pulmicort nebulizers 8. Hypernatremia improved 9. Decreased pulse right lower extremity. Vascular surgery consultation appreciated. Likely peripheral vascular disease. 10. Weakness- needs physical therapy and likely rehabilitation   Code Status:     Code Status Orders        Start     Ordered   05/18/15 0352  Full code   Continuous     05/18/15 0351    Code Status History    Date Active Date Inactive Code Status Order ID Comments User Context   This patient has a current code status but no historical code status.    Advance Directive Documentation        Most Recent Value   Type of Advance Directive  Healthcare Power of Attorney   Pre-existing out of facility DNR order (yellow form or pink MOST form)     "MOST" Form in Place?       Disposition Plan: Determination on whether the patient can eat on  his own prior to discharge out to a rehabilitation  Consultants:  Critical care specialist  Surgery  Vascular surgery  Infectious disease  Time spent: 22 minutes  [place], [place] [facility]

## 2015-06-01 LAB — CBC
HEMATOCRIT: 35 % — AB (ref 40.0–52.0)
Hemoglobin: 11.6 g/dL — ABNORMAL LOW (ref 13.0–18.0)
MCH: 31.7 pg (ref 26.0–34.0)
MCHC: 33.2 g/dL (ref 32.0–36.0)
MCV: 95.3 fL (ref 80.0–100.0)
PLATELETS: 242 10*3/uL (ref 150–440)
RBC: 3.67 MIL/uL — ABNORMAL LOW (ref 4.40–5.90)
RDW: 13.5 % (ref 11.5–14.5)
WBC: 5 10*3/uL (ref 3.8–10.6)

## 2015-06-01 LAB — GLUCOSE, CAPILLARY
GLUCOSE-CAPILLARY: 114 mg/dL — AB (ref 65–99)
GLUCOSE-CAPILLARY: 115 mg/dL — AB (ref 65–99)
GLUCOSE-CAPILLARY: 158 mg/dL — AB (ref 65–99)
Glucose-Capillary: 121 mg/dL — ABNORMAL HIGH (ref 65–99)
Glucose-Capillary: 99 mg/dL (ref 65–99)
Glucose-Capillary: 109 mg/dL — ABNORMAL HIGH (ref 65–99)

## 2015-06-01 LAB — BASIC METABOLIC PANEL
ANION GAP: 5 (ref 5–15)
BUN: 37 mg/dL — AB (ref 6–20)
CALCIUM: 8 mg/dL — AB (ref 8.9–10.3)
CO2: 28 mmol/L (ref 22–32)
CREATININE: 0.79 mg/dL (ref 0.61–1.24)
Chloride: 109 mmol/L (ref 101–111)
GFR calc Af Amer: >60 mL/min (ref >60)
GLUCOSE: 122 mg/dL — AB (ref 65–99)
Potassium: 3.8 mmol/L (ref 3.5–5.1)
Sodium: 142 mmol/L (ref 135–145)

## 2015-06-01 LAB — CULTURE, BLOOD (ROUTINE X 2)
CULTURE: NO GROWTH
CULTURE: NO GROWTH

## 2015-06-01 LAB — MAGNESIUM: Magnesium: 2.4 mg/dL (ref 1.7–2.4)

## 2015-06-01 LAB — PHOSPHORUS: Phosphorus: 5.5 mg/dL — ABNORMAL HIGH (ref 2.5–4.6)

## 2015-06-01 MED ORDER — HALOPERIDOL LACTATE 5 MG/ML IJ SOLN
5.0000 mg | Freq: Four times a day (QID) | INTRAMUSCULAR | Status: DC | PRN
  Administered 2015-06-02: 5 mg via INTRAVENOUS
  Filled 2015-06-01 (×2): qty 1

## 2015-06-01 MED ORDER — THIAMINE HCL 100 MG/ML IJ SOLN
100.0000 mg | Freq: Every day | INTRAMUSCULAR | Status: DC
  Administered 2015-06-01 – 2015-06-08 (×8): 100 mg via INTRAVENOUS
  Filled 2015-06-01 (×8): qty 2

## 2015-06-01 MED ORDER — QUETIAPINE FUMARATE 25 MG PO TABS
50.0000 mg | ORAL_TABLET | Freq: Two times a day (BID) | ORAL | Status: DC
  Administered 2015-06-01 – 2015-06-02 (×3): 50 mg via ORAL
  Filled 2015-06-01 (×3): qty 2

## 2015-06-01 MED ORDER — LORAZEPAM 2 MG/ML IJ SOLN
1.0000 mg | INTRAMUSCULAR | Status: DC | PRN
  Administered 2015-06-01 – 2015-06-02 (×4): 1 mg via INTRAVENOUS
  Filled 2015-06-01 (×3): qty 1

## 2015-06-01 MED ORDER — LORAZEPAM 2 MG/ML IJ SOLN
1.0000 mg | INTRAMUSCULAR | Status: DC | PRN
  Administered 2015-06-01: 1 mg via INTRAVENOUS
  Filled 2015-06-01: qty 1

## 2015-06-01 NOTE — Progress Notes (Signed)
RN spoke with Dr. Azalee Course and MD gave order for RN to remove JP drain and to start seroquel 50mg  PO BID if patient will cooperate and able to take.

## 2015-06-01 NOTE — Progress Notes (Signed)
Nutrition Follow-up    INTERVENTION:   Meals and Snacks: await diet progression   NUTRITION DIAGNOSIS:   Inadequate oral intake related to acute illness, altered GI function as evidenced by NPO status.  GOAL:   Provide needs based on ASPEN/SCCM guidelines  MONITOR:    (PN, Energy Intake, Anthropometrics, Electrolyte/Renal Profile, Glucose Profile)  REASON FOR ASSESSMENT:   Ventilator, Consult New TPN/TNA  ASSESSMENT:   Pt increased agitation and delerium during the night, restarted on precedex, this AM pt lethargic, unable to safely take po  Diet Order:  Diet NPO time specified   Energy Intake: pt unable to tolerate CL diet yesterday due to mental status   Recent Labs Lab 05/29/15 0822 05/30/15 0400 05/31/15 0440 06/01/15 0453  NA 140 143 145 142  K 3.4* 3.6 3.5 3.8  CL 103 106 108 109  CO2 31 30 27 28   BUN 20 21* 30* 37*  CREATININE 0.82 0.75 0.83 0.79  CALCIUM 7.7* 8.0* 8.0* 8.0*  MG 2.1 2.2  --  2.4  PHOS 3.1 5.3*  --  5.5*  GLUCOSE 192* 103* 168* 122*    Glucose Profile:  Recent Labs  06/01/15 0416 06/01/15 0731 06/01/15 1222  GLUCAP 121* 115* 114*   Meds: ss novolog, thiamine,  Height:   Ht Readings from Last 1 Encounters:  05/18/15 5\' 8"  (1.727 m)    Weight:   Wt Readings from Last 1 Encounters:  06/01/15 157 lb 13.6 oz (71.6 kg)    BMI:  Body mass index is 24.01 kg/(m^2).  Estimated Nutritional Needs:   Kcal:  BEE: 1510kcals, TEE: (IF 1.1-1.3)(AF 1.2) 1993-2356kcals  Protein:  93-116g protein (1.2-1.5g/kg)  Fluid:  1925-2310 mL (25-30 ml/kg)   HIGH Care Level  Kerman Passey MS, RD, LDN [phone] Pager  [phone] Weekend/On-Call Pager

## 2015-06-01 NOTE — Progress Notes (Addendum)
70 yr old POD#12  incarcerated ventral hernia repair.  Patient had another acute delirium of the overnight requiring Precedex. Patient still confused today but able to open eyes and answer questions however he does not know where he is what's going on. Patient does state that he is not having any abdominal pain and that he is thirsty  Filed Vitals:   06/01/15 1900 06/01/15 2000  BP: 114/73 118/74  Pulse: 104 100  Temp:    Resp: 24 26   I/O last 3 completed shifts: In: 633.8 [P.O.:60; I.V.:213.8; NG/GT:360] Out: 10 [Drains:10]     PE:  Gen: NAD Res: crackles in bases Cardio: rRR [id number], wound clean and dry, JP drain with serous drainage Ext:  no edema, discoloration of R 5th toe  CBC Latest Ref Rng 06/01/2015 05/28/2015 05/27/2015  WBC 3.8 - 10.6 K/uL 5.0 6.1 6.8  Hemoglobin 13.0 - 18.0 g/dL 11.6(L) 11.1(L) 12.0(L)  Hematocrit 40.0 - 52.0 % 35.0(L) 32.8(L) 36.3(L)  Platelets 150 - 440 K/uL 242 206 195   CMP Latest Ref Rng 06/01/2015 05/31/2015 05/30/2015  Glucose 65 - 99 mg/dL 122(H) 168(H) 103(H)  BUN 6 - 20 mg/dL 37(H) 30(H) 21(H)  Creatinine 0.61 - 1.24 mg/dL 0.79 0.83 0.75  Sodium 135 - 145 mmol/L 142 145 143  Potassium 3.5 - 5.1 mmol/L 3.8 3.5 3.6  Chloride 101 - 111 mmol/L 109 108 106  CO2 22 - 32 mmol/L 28 27 30   Calcium 8.9 - 10.3 mg/dL 8.0(L) 8.0(L) 8.0(L)    A/P: 70 yr old POD#12  incarcerated ventral hernia repair.  Improved from a pure surgical standpoint JP drain discontinued complaining of abdominal pain.  Acute delirium: Whether there is partially due to some alcohol withdrawal orders standard ICU delirium seems to still be waxing and waning scuffs with Dr. Mortimer Fries today using some by mouth Seroquel and Haldol for his agitation, Precedex is now been weaned off and still using some when necessary benzos for agitation as well.  GI: Patient able to be coaxed into swallowing his pills, however with mental status unable to give him liquids to drink. IF His mental status does  not allow for safe PO nutrition in the next couple of days we'll need to place a Dobbhoff tube to restart feeds   Patient could likely be transferred to an LTAC or to a SNF soon.

## 2015-06-01 NOTE — Progress Notes (Signed)
Speech Therapy Note: reviewed chart notes; consulted NSG and Dietician re: pt's status. Pt was agitated and given sedating meds last evening and is currently too lethargic to safely take po's. MD has made pt NPO.  ST services will f/u w/ pt's status tomorrow for appropriateness for po's/diet. Rec. Oral care regularly during the day while NPO.

## 2015-06-01 NOTE — Progress Notes (Signed)
Patient has some redness and irritate on perineum due to wearing a brief.  Received an order to place a condom cath.  Noted some drainage and thick discharge from penis area with last change.  Spoke with Dr. Marcille Blanco and received an order to obtain a urine sample.  Patient became agitated and combative during bed change but calmed down with an increase in precedex drip, has been resting most of the night with stable vital signs and no signs of distress.  Will continue to monitor.

## 2015-06-01 NOTE — Progress Notes (Signed)
Inpatient Diabetes Program Recommendations  AACE/ADA: New Consensus Statement on Inpatient Glycemic Control (2015)  Target Ranges:  Prepandial:   less than 140 mg/dL      Peak postprandial:   less than 180 mg/dL (1-2 hours)      Critically ill patients:  140 - 180 mg/dL   Review of Glycemic Control  Results for ANGAS, SHINDLEDECKER (MRN [id number]) as of 06/01/2015 10:45  Ref. Range 05/31/2015 15:50 05/31/2015 20:00 06/01/2015 00:44 06/01/2015 04:16 06/01/2015 07:31  Glucose-Capillary Latest Ref Range: 65-99 mg/dL 116 (H) 126 (H) 158 (H) 121 (H) 115 (H)    Agree with current orders, 1-3 units of Novolog q4h.  Will follow.   Gentry Fitz, RN, BA, MHA, CDE Diabetes Coordinator Inpatient Diabetes Program  [phone] (Team Pager) [phone] ([place]) 06/01/2015 10:52 AM

## 2015-06-01 NOTE — Progress Notes (Signed)
Patient very agitated, almost climbed out of bed. RN and Adolm Joseph, NT assisted patient back in bed but patient was not very cooperative. PRN ativan given and patient is calmer at this time. Dr. Azalee Course came by and witnessed agitation. MD to place order for PRN haldol.

## 2015-06-01 NOTE — Progress Notes (Signed)
[facility]  Daily Progress Note   Subjective  - 12 Days Post-Op  Remains agitated even on precedex. Unable to provide history  Discoloration of right fifth toe now present.  Objective Filed Vitals:   06/01/15 1206 06/01/15 1300 06/01/15 1400 06/01/15 1520  BP: 103/66 100/75 108/87 116/80  Pulse:   81 76  Temp: 97.7 F (36.5 C)     TempSrc: Axillary     Resp:  28 25 23   Height:      Weight:      SpO2:  100% 98% 99%    Intake/Output Summary (Last 24 hours) at 06/01/15 1618 Last data filed at 06/01/15 1130  Gross per 24 hour  Intake 213.75 ml  Output     10 ml  Net 203.75 ml    PULM  CTAB CV  RRR VASC  No palpable pedal pulses on the right.  Fifth toe on the right now dusky, which is new from earlier this week MS remains poor and he is agitated and all over the bed even on precedex.   Laboratory CBC    Component Value Date/Time   WBC 5.0 06/01/2015 0453   WBC 8.8 08/30/2014 1612   WBC 8.0 07/28/2013 0444   HGB 11.6* 06/01/2015 0453   HGB 14.9 07/28/2013 0444   HCT 35.0* 06/01/2015 0453   HCT 45.5 08/30/2014 1612   HCT 45.0 07/28/2013 0444   PLT 242 06/01/2015 0453   PLT 176 08/30/2014 1612   PLT 164 07/28/2013 0444    BMET    Component Value Date/Time   NA 142 06/01/2015 0453   NA 141 07/28/2013 0444   K 3.8 06/01/2015 0453   K 4.6 07/28/2013 0444   CL 109 06/01/2015 0453   CL 111* 07/28/2013 0444   CO2 28 06/01/2015 0453   CO2 26 07/28/2013 0444   GLUCOSE 122* 06/01/2015 0453   GLUCOSE 86 07/28/2013 0444   BUN 37* 06/01/2015 0453   BUN 6* 07/28/2013 0444   CREATININE 0.79 06/01/2015 0453   CREATININE 1.13 07/28/2013 0444   CALCIUM 8.0* 06/01/2015 0453   CALCIUM 8.7 07/28/2013 0444   GFRNONAA >60 06/01/2015 0453   GFRNONAA >60 07/28/2013 0444   GFRNONAA >60 04/13/2011 0325   GFRAA >60 06/01/2015 0453   GFRAA >60 07/28/2013 0444   GFRAA >60 04/13/2011 0325    Assessment/Planning:    Right fifth toe  discoloration.  Possible ischemia.  Pulses in right foot not palpable.  With heavy tobacco use and other issues, significant PAD is likely  Not at all calm on Precedex, and would be hesitant to put him under anesthesia for an angiogram at this point.  Watch toe for demarcation  May be able to do angiogram next week if mental status clears more  Heel protectors and avoidance of pressure at this point.    Batu Cassin  06/01/2015, 4:18 PM

## 2015-06-01 NOTE — Progress Notes (Signed)
Patient ID: Brady Smith, male   DOB: Aug 01, 1945, 70 y.o.   MRN: [id number] [facility] PROGRESS NOTE  ALISHER BELTON [id number] DOB: Dec 12, 1945 DOA: 05/17/2015 PCP: Halina Maidens, MD  HPI/Subjective: Patient on Precedex drip. Patient is lethargic and unable to give me any history at this time.  Objective: Filed Vitals:   06/01/15 1400 06/01/15 1520  BP: 108/87 116/80  Pulse: 81 76  Temp:    Resp: 25 23    Filed Weights   05/30/15 0500 05/31/15 0511 06/01/15 [id number]  Weight: 72.6 kg (160 lb 0.9 oz) 71.6 kg (157 lb 13.6 oz) 71.6 kg (157 lb 13.6 oz)    ROS: Review of Systems  Unable to perform ROS  Limited secondary to altered mental status Exam: Physical Exam  Constitutional: He appears lethargic.  HENT:  Nose: No mucosal edema.  Mouth/Throat: No oropharyngeal exudate or posterior oropharyngeal edema.  Eyes: Conjunctivae and lids are normal. Pupils are equal, round, and reactive to light.  Neck: No JVD present. Carotid bruit is not present. No edema present. No thyroid mass and no thyromegaly present.  Cardiovascular: S1 normal, S2 normal and normal heart sounds.  Exam reveals no gallop.   No murmur heard. Pulses:      Dorsalis pedis pulses are 0 on the right side, and 1+ on the left side.  Respiratory: No respiratory distress. He has no wheezes. He has rhonchi in the right lower field and the left lower field. He has no rales.  GI: Soft. Bowel sounds are normal. He exhibits no distension. There is no tenderness.  Musculoskeletal:       Right ankle: He exhibits no swelling.       Left ankle: He exhibits no swelling.  Lymphadenopathy:    He has no cervical adenopathy.  Neurological: He appears lethargic.  Skin: Skin is warm. Nails show no clubbing.  Right foot cool to touch. Right fifth toe starting to turn a brownish blackish discoloration  Psychiatric:  Unable to assess      Data Reviewed: Basic Metabolic Panel:  Recent Labs Lab 05/27/15 0331  05/28/15 0918 05/29/15 0822 05/30/15 0400 05/31/15 0440 06/01/15 0453  NA 141 138 140 143 145 142  K 3.7 3.6 3.4* 3.6 3.5 3.8  CL 106 102 103 106 108 109  CO2 32 31 31 30 27 28   GLUCOSE 151* 179* 192* 103* 168* 122*  BUN 24* 23* 20 21* 30* 37*  CREATININE 0.80 0.86 0.82 0.75 0.83 0.79  CALCIUM 7.4* 7.4* 7.7* 8.0* 8.0* 8.0*  MG 2.1 2.2 2.1 2.2  --  2.4  PHOS 3.5 3.6 3.1 5.3*  --  5.5*   CBC:  Recent Labs Lab 05/27/15 1247 05/28/15 0918 06/01/15 0453  WBC 6.8 6.1 5.0  NEUTROABS 6.0  --   --   HGB 12.0* 11.1* 11.6*  HCT 36.3* 32.8* 35.0*  MCV 97.6 94.0 95.3  PLT 195 206 242    CBG:  Recent Labs Lab 05/31/15 2000 06/01/15 0044 06/01/15 0416 06/01/15 0731 06/01/15 1222  GLUCAP 126* 158* 121* 115* 114*    Recent Results (from the past 240 hour(s))  CULTURE, BLOOD (ROUTINE X 2) w Reflex to PCR ID Panel     Status: None   Collection Time: 05/27/15  1:26 PM  Result Value Ref Range Status   Specimen Description BLOOD RIGHT HAND  Final   Special Requests BOTTLES DRAWN AEROBIC AND ANAEROBIC 3CC  Final   Culture NO GROWTH 5 DAYS  Final  Report Status 06/01/2015 FINAL  Final  CULTURE, BLOOD (ROUTINE X 2) w Reflex to PCR ID Panel     Status: None   Collection Time: 05/27/15  1:33 PM  Result Value Ref Range Status   Specimen Description BLOOD LEFT HAND  Final   Special Requests BOTTLES DRAWN AEROBIC AND ANAEROBIC 5CC  Final   Culture NO GROWTH 5 DAYS  Final   Report Status 06/01/2015 FINAL  Final     Scheduled Meds: . antiseptic oral rinse  7 mL Mouth Rinse q12n4p  . budesonide (PULMICORT) nebulizer solution  0.5 mg Nebulization BID  . chlorhexidine  15 mL Mouth Rinse BID  . diltiazem  120 mg Oral Daily  . enoxaparin (LOVENOX) injection  40 mg Subcutaneous Q24H  . insulin aspart  1-3 Units Subcutaneous 6 times per day  . ipratropium-albuterol  3 mL Nebulization Q6H  . pantoprazole sodium  40 mg Per Tube Daily  . QUEtiapine  50 mg Oral BID  . thiamine IV  100 mg  Intravenous Daily    Assessment/Plan:  1. Acute encephalopathy - likely from long-standing alcoholism and alcohol withdrawal. Patient on Precedex drip. Hopefully will come off Precedex shortly and cervical and be given. 2. Drug fever to invanz. Fever curve trended better. Now off antibiotics 3. Incarcerated ventral hernia. Status post removal of necrotic bowel and incarcerated hernia repair. Patient may end up needing a PEG tube. 4. Alcohol withdrawal. Valium made when necessary at night. 5. Acute respiratory failure with hypoxia. Patient was reintubated 05/24/2015 secondary to encephalopathy. Patient extubated 05/28/2015. Currently breathing comfortably on nasal cannula. Nebulizer treatments 6. Atrial fibrillation. Start Cardizem CD. Hold anticoagulation at this point. 7. History of COPD on  Pulmicort nebulizers 8. Hypernatremia improved 9. Decreased pulse right lower extremity. Vascular surgery consultation appreciated. Likely peripheral vascular disease. Right fifth toe starting to turn blackish discoloration 10. Weakness- needs physical therapy and likely rehabilitation   Code Status:     Code Status Orders        Start     Ordered   05/18/15 0352  Full code   Continuous     05/18/15 0351    Code Status History    Date Active Date Inactive Code Status Order ID Comments User Context   This patient has a current code status but no historical code status.    Advance Directive Documentation        Most Recent Value   Type of Advance Directive  Healthcare Power of Attorney   Pre-existing out of facility DNR order (yellow form or pink MOST form)     "MOST" Form in Place?       Disposition Plan: Determination on whether the patient can eat on his own prior to discharge out to a rehabilitation  Consultants:  Critical care specialist  Surgery  Vascular surgery  Infectious disease  Time spent: 20 minutes  [place], [place]  Hospitalists

## 2015-06-01 NOTE — Care Management (Signed)
Barrier to discharge- increased agitation and back on Precedex

## 2015-06-01 NOTE — Progress Notes (Signed)
[place] NOTE  Pharmacy Consult for electrolytes and glucose management  Indication: Tube Feeds/ICU Status   No Known Allergies  Patient Measurements: Height: 5\' 8"  (172.7 cm) Weight: 157 lb 13.6 oz (71.6 kg) IBW/kg (Calculated) : 68.4  Vital Signs: Temp: 97.5 F (36.4 C) (03/09 0800) Temp Source: Axillary (03/09 0800) BP: 87/60 mmHg (03/09 0800) Pulse Rate: 66 (03/09 0800) Intake/Output from previous day: 03/08 0701 - 03/09 0700 In: 589.1 [P.O.:60; I.V.:169.1; NG/GT:360] Out: 10 [Drains:10] Intake/Output from this shift: Total I/O In: 12.5 [I.V.:12.5] Out: -   Labs:  Recent Labs  05/30/15 0400 05/31/15 0440 06/01/15 0453  WBC  --   --  5.0  HGB  --   --  11.6*  HCT  --   --  35.0*  PLT  --   --  242  CREATININE 0.75 0.83 0.79  MG 2.2  --  2.4  PHOS 5.3*  --  5.5*   Estimated Creatinine Clearance: 84.3 mL/min (by C-G formula based on Cr of 0.79).   Recent Labs  06/01/15 0044 06/01/15 0416 06/01/15 0731  GLUCAP 158* 121* 115*     Assessment: Pharmacy consulted for electrolytes and glucose management for 70 yo male s/p incarcerated hernia repair. Patient previously on TPN now on tube feeds at 35mL/hr.    Plan:  1. Electrolytes: No replacement warranted at this time. Pharmacy will continue to monitor and adjust per consult.    2. Glucose: Continue SSI. Will continue to monitor.    Pharmacy will continue to monitor and adjust per consult.   Vena Rua 06/01/2015,8:24 AM

## 2015-06-01 NOTE — Progress Notes (Signed)
PULMONARY / CRITICAL CARE MEDICINE   Name: Brady Smith MRN: [id number] DOB: 21-Apr-1945    ADMISSION DATE:  05/17/2015 CONSULTATION DATE:  02/26  REFERRING MD:  Adonis Huguenin   Subjective Extubated yesterday, lethargic started on precedex last night due to severe agitation Unable to take PO meds   VITAL SIGNS: BP 95/63 mmHg  Pulse 74  Temp(Src) 97.5 F (36.4 C) (Axillary)  Resp 23  Ht 5\' 8"  (1.727 m)  Wt 157 lb 13.6 oz (71.6 kg)  BMI 24.01 kg/m2  SpO2 95%   INTAKE / OUTPUT: I/O last 3 completed shifts: In: 1394.1 [P.O.:60; I.V.:169.1; NG/GT:965; IV Piggyback:200] Out: 10 [Drains:10]   ROS limited due to lethargy  PHYSICAL EXAMINATION: General: NAD, lethargic Neuro: Moves extremities, follows basic commands HEENT: NCAT Cardiovascular: RRR, S1/S2, no MRG Lungs: Bilateral airflow, mild expiratory wheezes, coarse rhonchi, diminished breath sounds bilaterally Abdomen: post op changes, abd soft, diminished to absent BS Ext: warm, no edema  LABS:  BMET  Recent Labs Lab 05/30/15 0400 05/31/15 0440 06/01/15 0453  NA 143 145 142  K 3.6 3.5 3.8  CL 106 108 109  CO2 30 27 28   BUN 21* 30* 37*  CREATININE 0.75 0.83 0.79  GLUCOSE 103* 168* 122*    Electrolytes  Recent Labs Lab 05/29/15 0822 05/30/15 0400 05/31/15 0440 06/01/15 0453  CALCIUM 7.7* 8.0* 8.0* 8.0*  MG 2.1 2.2  --  2.4  PHOS 3.1 5.3*  --  5.5*    CBC  Recent Labs Lab 05/27/15 1247 05/28/15 0918 06/01/15 0453  WBC 6.8 6.1 5.0  HGB 12.0* 11.1* 11.6*  HCT 36.3* 32.8* 35.0*  PLT 195 206 242    Coag's No results for input(s): APTT, INR in the last 168 hours.  Sepsis Markers No results for input(s): LATICACIDVEN, PROCALCITON, O2SATVEN in the last 168 hours.  ABG  Recent Labs Lab 05/25/15 1544 05/28/15 1222  PHART 7.46* 7.44  PCO2ART 44 47  PO2ART 84 78*    Liver Enzymes No results for input(s): AST, ALT, ALKPHOS, BILITOT, ALBUMIN in the last 168 hours.  Cardiac Enzymes No  results for input(s): TROPONINI, PROBNP in the last 168 hours.  Glucose  Recent Labs Lab 05/31/15 1120 05/31/15 1550 05/31/15 2000 06/01/15 0044 06/01/15 0416 06/01/15 0731  GLUCAP 125* 116* 126* 158* 121* 115*    No results found. MAJOR EVENTS/TEST RESULTS: VENT DAY #6 02/23 Admission 02/23 IM consult 02/25 Laparotomy, strangulated small bowel, small bowel resection 03/01 EXTUBATED 03/02 RE-INTUBATED 03/05 EXTUBATED  INDWELLING DEVICES:: ETT 02/25 >>   MICRO DATA: Urine 02/23 >> NEG MRSA PCR 02/25 >> NEG REsp culture-CITROBACTER SPECIES ANTIMICROBIALS:  Ertapenem 02/25 >> 3/8  Discussion 70 yo white male with acute s/p laparotomy for acute small bowel infarction complicated by acute resp failure and metabolic encephalopathy and acute etoh withdrawal requiring vent support with CITROBACTER PNEUMONIA, complicated by delerium At this time, I am considering Wernicke's Encephalopathy from chronic ETOH abuse with ICU psychosis as underlying cause for delerium and agitation  ASSESSMENT / PLAN: PULMONARY-resp failure improved -extubated, aspiration precautions -oxygen as needed   GASTROINTESTINAL A:   Acute small bowel infarction S/P laparotomy -start oral feeds if awake/alert -follow gen surgery recs  HEMATOLOGIC A:   Very mild thrombocytopenia-resolved P:  DVT px: LMWH Monitor CBC intermittently Transfuse per usual ICU guidelines  INFECTIOUS Citrobacter pneumonia -abx completed  ENDOCRINE A:   Mild hyperglycemia without prior dx of DM P:   Blood glucose monitoring every 4 hours with sliding scale insulin  coverage per protocol   NEUROLOGIC A:   Heavy ETOH use Post op pain -sedatives as needed -wean off precedex-assess neuro status and will  use ativan as needed more frequently     The Patient requires high complexity decision making for assessment and support, frequent evaluation and titration of therapies.   Corrin Parker, M.D.   [facility]  Medical Director [place] Director [facility] Cardio-Pulmonary Department

## 2015-06-01 NOTE — Progress Notes (Signed)
PT Cancellation Note  Patient Details Name: Brady Smith MRN: [id number] DOB: 11/21/1945   Cancelled Treatment:    Reason Eval/Treat Not Completed: Fatigue/lethargy limiting ability to participate. Treatment attempted; pt too lethargic to participate with PT. Pt unable to show any active assisted motion on left side, as he did last session despite lethargy then as well. Re attempt treatment tomorrow.    Charlaine Dalton, [place] 06/01/2015, 12:13 PM

## 2015-06-02 DIAGNOSIS — I10 Essential (primary) hypertension: Secondary | ICD-10-CM

## 2015-06-02 LAB — GLUCOSE, CAPILLARY
GLUCOSE-CAPILLARY: 123 mg/dL — AB (ref 65–99)
GLUCOSE-CAPILLARY: 126 mg/dL — AB (ref 65–99)
GLUCOSE-CAPILLARY: 98 mg/dL (ref 65–99)
Glucose-Capillary: 87 mg/dL (ref 65–99)
Glucose-Capillary: 110 mg/dL — ABNORMAL HIGH (ref 65–99)
Glucose-Capillary: 96 mg/dL (ref 65–99)
Glucose-Capillary: 126 mg/dL — ABNORMAL HIGH (ref 65–99)

## 2015-06-02 LAB — BASIC METABOLIC PANEL
ANION GAP: 6 (ref 5–15)
BUN: 38 mg/dL — ABNORMAL HIGH (ref 6–20)
CHLORIDE: 113 mmol/L — AB (ref 101–111)
CO2: 28 mmol/L (ref 22–32)
Calcium: 8.3 mg/dL — ABNORMAL LOW (ref 8.9–10.3)
Creatinine, Ser: 1.03 mg/dL (ref 0.61–1.24)
GFR calc Af Amer: >60 mL/min (ref >60)
GFR calc non Af Amer: >60 mL/min (ref >60)
GLUCOSE: 121 mg/dL — AB (ref 65–99)
POTASSIUM: 3.8 mmol/L (ref 3.5–5.1)
Sodium: 147 mmol/L — ABNORMAL HIGH (ref 135–145)

## 2015-06-02 LAB — MAGNESIUM: Magnesium: 2.6 mg/dL — ABNORMAL HIGH (ref 1.7–2.4)

## 2015-06-02 LAB — PHOSPHORUS: Phosphorus: 4.6 mg/dL (ref 2.5–4.6)

## 2015-06-02 MED ORDER — MORPHINE SULFATE (PF) 2 MG/ML IV SOLN
2.0000 mg | INTRAVENOUS | Status: DC | PRN
  Administered 2015-06-02 – 2015-06-03 (×2): 2 mg via INTRAVENOUS
  Filled 2015-06-02 (×2): qty 1

## 2015-06-02 MED ORDER — PANTOPRAZOLE SODIUM 40 MG PO TBEC
40.0000 mg | DELAYED_RELEASE_TABLET | Freq: Every day | ORAL | Status: DC
  Administered 2015-06-02 – 2015-06-08 (×7): 40 mg via ORAL
  Filled 2015-06-02 (×7): qty 1

## 2015-06-02 MED ORDER — SENNOSIDES-DOCUSATE SODIUM 8.6-50 MG PO TABS: 1.0000 | ORAL_TABLET | Freq: Every day | ORAL | Status: DC

## 2015-06-02 MED ORDER — QUETIAPINE FUMARATE ER 50 MG PO TB24
50.0000 mg | ORAL_TABLET | Freq: Every day | ORAL | Status: DC
  Administered 2015-06-02: 50 mg via ORAL
  Filled 2015-06-02: qty 1

## 2015-06-02 MED ORDER — MORPHINE SULFATE (PF) 2 MG/ML IV SOLN: 2.0000 mg | INTRAVENOUS | Status: DC | PRN

## 2015-06-02 NOTE — Progress Notes (Signed)
CSW contacted patient's brother Deandre Ballin [phone]. Presented bed offers. Patient's brother accepted a bed at [facility]. CSW contacted Bill-admissions coordinator at [facility] to inform him of above. Patient will discharge to [facility] when medically stable. CSW will continue to follow and assist.  Ernest Pine, MSW, [place] Work Department [phone]

## 2015-06-02 NOTE — Progress Notes (Signed)
Speech Language Pathology Treatment: Dysphagia  Patient Details Name: SAMAD MCCASKEY MRN: [id number] DOB: [birth year] Today's Date: 06/02/2015 Time: 0930-1010 SLP Time Calculation (min) (ACUTE ONLY): 40 min  Assessment / Plan / Recommendation Clinical Impression  Pt appears at increased risk for aspiration at this time sec. to decreased alertness(still requiring meds for agitation per NSG report) for follow through w/ tasks of oral intake, Confusion in general, and drowsiness. Unable to fully assess degree of oropharyngeal phase risk for aspiration sec. to declined Cognitive presentation. H exhibited a delayed throat clearing x1 w/ tsp/straw trials of Nectar consistency liquids - approx. 3 ozs; no overt s/s of aspiration noted w/ the 1/2 tsp of puree though pt did use multiple swallows to fully clear intermittently. Of note, no decline in O2 sats (94-95%, RR or HR). During the oral phase, pt exhibited min. Slower bolus management and transfer for swallowing - overall decreased awareness for task. As trials continued, pt exhibited discomfort (back?) and refusal behaviors when offered further po's. No trials of thin liquids or solids presented at this assessment today sec. to overall status/presentation.  Rec. The option of initiating an oral diet again to MD/team during rounds. Rec. A Dys. 1 w/ Nectar consistency liquids (per MD ok sec. to GI status); strict aspiration precautions and feeding assistance w/ max. verbal/tactile cues as needed for follow through w/ tasks - must maintain alertness. Rec. nutritional supplement; meds in puree - crushed as able. Rec. ST f/u for ongoing assessment of pt's swallow function and safe toleration of an oral diet; trials to upgrade as appropriate. NSG and Dietician updated.    HPI HPI: Pt is a 70 y.o. male w/ h/o kidney and bladder Ca, COPD smoking ~2 packs of cigarettes daily(currenlty), pneumonia, and other medical issues who presents to emergency department for a  worsening abdominal hernia. Patient states that after eating a large volume of corn last night, he started having some nausea followed by swelling in his midline and a new area of "bump". He's had a known history of a very large midline hernia and this is his fourth presentation to our group for this identical complaint. Patient states that he was told that the last 2 visits that he should seek his hernia repair but he did not follow up with this advice. Patient currently only complaining of abdominal pain at the site of his hernia. He denies any current nausea, vomiting, chest pain, shortness of breath. He has been passing flatus and his last bowel movement was earlier today albeit small per the patient. Patient admits to smoking 2 packs of cigarettes per day. Patient also states that he drinks one to 2 cans of beer per day as well. During this hospitalization, pt had bowel surgery and remained orally intubated ~1 week d/t respiratory status. He had a bowel resection over a week ago for an incarcerated ventral hernia with necrotic bowel. He had a prolonged postoperative course secondary to alcohol withdrawals and has just come off the ventilator this past weekend. He remains very lethargic and is unable to provide any history and all of the history is obtained from the previous medical record. It was noted that his right foot did not have palpable pedal pulses and was slightly cooler than the left. He does not have any open ulcerations, infection, gangrenous changes, or nonviable tissue of the right foot that I can see. He is unable to provide any history in terms of whether or not he has claudication or describes any pain.  The nurses have used the appropriate padding devices on the right heel to avoid any pressure ulcerations. He does have a palpable pulse in the left foot although it is not prominent. Continues w/ the NG in place along w/ TPN for nutritional support. Pt is drowsy; weaned from any fully sedating  meds per MD, however, pt it still requring meds for agitation. Mumbled speech per NSG and stating he is thirsty per report. NG in place for tx. Mod-max cues still required for follow through w/ tasks of eating/drinking during tx session d/t confusion. Pt was made NPO yesterday per MD sec. to increased lethargy.       SLP Plan  Continue with current plan of care     Recommendations  Diet recommendations: Dysphagia 1 (puree);Nectar-thick liquid Liquids provided via: Teaspoon;Cup;Straw (assisted) Medication Administration: Crushed with puree Supervision: Full supervision/cueing for compensatory strategies;Trained caregiver to feed patient Compensations: Minimize environmental distractions;Slow rate;Small sips/bites;Follow solids with liquid (check for oral clearing) Postural Changes and/or Swallow Maneuvers: Seated upright 90 degrees;Upright 30-60 min after meal             General recommendations:  (Dietician following) Oral Care Recommendations: Oral care BID;Staff/trained caregiver to provide oral care Follow up Recommendations: Skilled Nursing facility Plan: Continue with current plan of care     [place], [place], CCC-SLP  Watson,Katherine 06/02/2015, 11:28 AM

## 2015-06-02 NOTE — Progress Notes (Signed)
Physical Therapy Treatment Patient Details Name: Brady Smith MRN: [id number] DOB: May 14, 1945 Today's Date: 06/02/2015    History of Present Illness presented to ER secondary to worsening abdominal hernia (previous recommended for follow up at tertiary care center); admitted with SBO related to incarcerated ventral hernia.  Status post exploratory laparotomy with small bowel resection (2/25); post-op course complicated by post-op shock, ETOH withdrawal, prolonged intubation (2/25-3/1, 3/2-3/5), afib rate-controlled and mild ileus.  Intermittent restless and agitated, requiring ativan/precedex for management.    PT Comments    Patient with significant lethargy this PM.  Opens eyes and verbally responds intermittently, but unable to maintain for adequate active effort with session despite max cuing from therapist, change of position and other external stimuli (cold washcloth, overhead light). Total assist +2 for all bed mobility and sit/supine efforts; profoundly weak and deconditioned.  Anticipate prolonged recovery process.   Follow Up Recommendations  SNF     Equipment Recommendations       Recommendations for Other Services       Precautions / Restrictions Precautions Precautions: Fall Precaution Comments: Nectar, L JP drain, CIWA Restrictions Weight Bearing Restrictions: No    Mobility  Bed Mobility Overal bed mobility: Needs Assistance Bed Mobility: Supine to Sit;Sit to Supine Rolling: Total assist;+2 for safety/equipment   Supine to sit: Total assist;+2 for safety/equipment Sit to supine: Total assist;+2 for safety/equipment   General bed mobility comments: minimal active effort with movement transition  Transfers                 General transfer comment: Unable due to level of lethargy  Ambulation/Gait             General Gait Details: unable to due to level of lethargy   Stairs            Wheelchair Mobility    Modified Rankin (Stroke  Patients Only)       Balance Overall balance assessment: Needs assistance Sitting-balance support: No upper extremity supported Sitting balance-Leahy Scale: Zero Sitting balance - Comments: max/dep for all sitting balance efforts during session                            Cognition Arousal/Alertness: Lethargic Behavior During Therapy: Restless Overall Cognitive Status: Difficult to assess                      Exercises Other Exercises Other Exercises: Patient unable to maintain alertness for any active participation with supine therex this date.    General Comments        Pertinent Vitals/Pain Pain Assessment: No/denies pain    Home Living                      Prior Function            PT Goals (current goals can now be found in the care plan section) Acute Rehab PT Goals PT Goal Formulation: Patient unable to participate in goal setting Time For Goal Achievement: 06/12/15 Potential to Achieve Goals: Fair Progress towards PT goals:  (able to transition to short-sitting, though with limited active effort from patient)    Frequency  Min 2X/week    PT Plan Current plan remains appropriate    Co-evaluation             End of Session   Activity Tolerance: Patient limited by lethargy Patient left: in bed;with bed alarm set  Time: [id number] PT Time Calculation (min) (ACUTE ONLY): 13 min  Charges:  $Therapeutic Activity: 8-22 mins                    G Codes:      Ola Raap H. Owens Shark, PT, DPT, NCS 06/02/2015, 4:49 PM [phone]

## 2015-06-02 NOTE — Progress Notes (Signed)
Pharmacy ICU Daily Progress Note  PN is a 69yo admitted 05/17/15 for SBO, hernia with AMS 2/2 possible Wernicke's from EtOH abuse/detox. Of note, patient has poor pedal pulses and blue toe, morphine ordered.   Active pharmacy consults: Electrolyte/glucose management  Plan: 1. 3/10 Electrolytes WNL. Continue to monitor and adjust per protocol.  2. Glucose: Pt on insulin aspart 1-3 units q4hrs. CBGs 121, 122, 168. Continue to monitor.    Infectious:  Antimicrobials: None WBC 5 Afebrile Culture Results: 3/4 BCx x2 NGTD (5 days)  Electrolytes:  Potassium: 3.8 Magnesium: 2.6 Phosphorus: 4.6 Supplementation plans: None at this time  Pulmonary: Bronchodilators? Budesonide 0.5 mg BID, duonebs q6hr Ventilator status: No O2 sat/FiO2: 99 on 2LNC  Current steroids: None Taper plans:   GI:  Constipation PPx: None (follow up on addition of an agent now that morphine has been added for prn pain0 Feeding status: NPO LBM: 06/02/15 SUP: pantoprazole 40mg  PT daily  Insulin:  SSI use in 24hrs: 4 units Current Insulin orders: SSI 1-3 units q 4 hrs Last 3 CBGs: 121, 122, 168  DVT PPx: lovenox 40mg  SQ q24hr  Sedation+Pain: precedex off, ativan prn, haldol prn, started seroquel 50mg  QHS today. Morphine ordered for pain. RASS goal?  GCS 13 Last pain score: 0 Opioid use in last 24 hrs:   Pressors: none MAP goal:   Medication education/counseling required?

## 2015-06-02 NOTE — Progress Notes (Signed)
Patient ID: Brady Smith, male   DOB: Aug 15, 1945, 70 y.o.   MRN: [id number] [facility] PROGRESS NOTE  SUMIT TOROSIAN [id number] DOB: Apr 05, 1945 DOA: 05/17/2015 PCP: Halina Maidens, MD  HPI/Subjective: Patient lethargic and I woke up. He mumbled something and went back to sleep.  Objective: Filed Vitals:   06/02/15 1300 06/02/15 1400  BP: 135/75 136/77  Pulse: 104 107  Temp:  98.6 F (37 C)  Resp: 26 31    Filed Weights   05/31/15 0511 06/01/15 0613 06/02/15 1000  Weight: 71.6 kg (157 lb 13.6 oz) 71.6 kg (157 lb 13.6 oz) 71 kg (156 lb 8.4 oz)    ROS: Review of Systems  Unable to perform ROS  Limited secondary to altered mental status Exam: Physical Exam  Constitutional: He appears lethargic.  HENT:  Nose: No mucosal edema.  Mouth/Throat: No oropharyngeal exudate or posterior oropharyngeal edema.  Eyes: Conjunctivae and lids are normal. Pupils are equal, round, and reactive to light.  Neck: No JVD present. Carotid bruit is not present. No edema present. No thyroid mass and no thyromegaly present.  Cardiovascular: S1 normal, S2 normal and normal heart sounds.  Exam reveals no gallop.   No murmur heard. Pulses:      Dorsalis pedis pulses are 0 on the right side, and 1+ on the left side.  Respiratory: No respiratory distress. He has no wheezes. He has rhonchi in the right lower field and the left lower field. He has no rales.  GI: Soft. Bowel sounds are normal. He exhibits no distension. There is no tenderness.  Musculoskeletal:       Right ankle: He exhibits no swelling.       Left ankle: He exhibits no swelling.  Lymphadenopathy:    He has no cervical adenopathy.  Neurological: He appears lethargic.  Skin: Skin is warm. Nails show no clubbing.  Right foot cool to touch. Right fifth toe starting to turn a brownish blackish discoloration  Psychiatric:  Unable to assess      Data Reviewed: Basic Metabolic Panel:  Recent Labs Lab 05/28/15 0918  05/29/15 0822 05/30/15 0400 05/31/15 0440 06/01/15 0453 06/02/15 0623  NA 138 140 143 145 142 147*  K 3.6 3.4* 3.6 3.5 3.8 3.8  CL 102 103 106 108 109 113*  CO2 31 31 30 27 28 28   GLUCOSE 179* 192* 103* 168* 122* 121*  BUN 23* 20 21* 30* 37* 38*  CREATININE 0.86 0.82 0.75 0.83 0.79 1.03  CALCIUM 7.4* 7.7* 8.0* 8.0* 8.0* 8.3*  MG 2.2 2.1 2.2  --  2.4 2.6*  PHOS 3.6 3.1 5.3*  --  5.5* 4.6   CBC:  Recent Labs Lab 05/27/15 1247 05/28/15 0918 06/01/15 0453  WBC 6.8 6.1 5.0  NEUTROABS 6.0  --   --   HGB 12.0* 11.1* 11.6*  HCT 36.3* 32.8* 35.0*  MCV 97.6 94.0 95.3  PLT 195 206 242    CBG:  Recent Labs Lab 06/01/15 2356 06/02/15 0345 06/02/15 0731 06/02/15 1137 06/02/15 1616  GLUCAP 98 123* 87 110* 96    Recent Results (from the past 240 hour(s))  CULTURE, BLOOD (ROUTINE X 2) w Reflex to PCR ID Panel     Status: None   Collection Time: 05/27/15  1:26 PM  Result Value Ref Range Status   Specimen Description BLOOD RIGHT HAND  Final   Special Requests BOTTLES DRAWN AEROBIC AND ANAEROBIC 3CC  Final   Culture NO GROWTH 5 DAYS  Final  Report Status 06/01/2015 FINAL  Final  CULTURE, BLOOD (ROUTINE X 2) w Reflex to PCR ID Panel     Status: None   Collection Time: 05/27/15  1:33 PM  Result Value Ref Range Status   Specimen Description BLOOD LEFT HAND  Final   Special Requests BOTTLES DRAWN AEROBIC AND ANAEROBIC 5CC  Final   Culture NO GROWTH 5 DAYS  Final   Report Status 06/01/2015 FINAL  Final     Scheduled Meds: . antiseptic oral rinse  7 mL Mouth Rinse q12n4p  . budesonide (PULMICORT) nebulizer solution  0.5 mg Nebulization BID  . chlorhexidine  15 mL Mouth Rinse BID  . diltiazem  120 mg Oral Daily  . enoxaparin (LOVENOX) injection  40 mg Subcutaneous Q24H  . insulin aspart  1-3 Units Subcutaneous 6 times per day  . ipratropium-albuterol  3 mL Nebulization Q6H  . pantoprazole  40 mg Oral Daily  . QUEtiapine  50 mg Oral QHS  . QUEtiapine  50 mg Oral BID  .  thiamine IV  100 mg Intravenous Daily    Assessment/Plan:  1. Acute encephalopathy - this has not improved much since I've seen him. He still on sedative medications as needed. I don't know if he is going to be able to keep up with his nutritional needs. 2. Drug fever to invanz. Fever curve trended better. Now off antibiotics 3. Incarcerated ventral hernia. Status post removal of necrotic bowel and incarcerated hernia repair. Patient may end up needing a PEG tube if he can't keep up with his nutritional needs 4. Alcohol withdrawal. Should already be through withdrawal but still agitated at times. He is on Seroquel standing dose and when necessary Ativan 5. Acute respiratory failure with hypoxia. Patient was reintubated 05/24/2015 secondary to encephalopathy. Patient extubated 05/28/2015. Currently breathing comfortably on 4 L nasal cannula. Nebulizer treatments 6. Atrial fibrillation. On Cardizem CD. Hold anticoagulation at this point. 7. History of COPD on  Pulmicort nebulizers 8. Hypernatremia starting to elevate again secondary to poor appetite. 9. Decreased pulse right lower extremity. Vascular surgery consultation appreciated. Likely peripheral vascular disease. Right fifth toe starting to turn blackish discoloration 10. Weakness- needs physical therapy and likely rehabilitation   Code Status:     Code Status Orders        Start     Ordered   05/18/15 0352  Full code   Continuous     05/18/15 0351    Code Status History    Date Active Date Inactive Code Status Order ID Comments User Context   This patient has a current code status but no historical code status.    Advance Directive Documentation        Most Recent Value   Type of Advance Directive  Healthcare Power of Attorney   Pre-existing out of facility DNR order (yellow form or pink MOST form)     "MOST" Form in Place?       Disposition Plan: Determination on whether the patient can eat on his own prior to discharge  out to a rehabilitation  Consultants:  Critical care specialist  Surgery  Vascular surgery  Infectious disease  Time spent: 21 minutes  [place]

## 2015-06-02 NOTE — Progress Notes (Signed)
PT Cancellation Note  Patient Details Name: Brady Smith MRN: [id number] DOB: 08-Jan-1946   Cancelled Treatment:    Reason Eval/Treat Not Completed: Other (comment) (Chart reviewed for attempted treatment session.  Patient noted to be visibly soiled; CNA at bedside for hygiene and linen change.  Will re-attempt at later time/date as patient available.)   Letisia Schwalb H. Owens Shark, PT, DPT, NCS 06/02/2015, 11:43 AM [phone]

## 2015-06-02 NOTE — Progress Notes (Signed)
Temp 97.7 axillary, placed warm blanket and increased temperature in room.

## 2015-06-02 NOTE — Progress Notes (Signed)
PULMONARY / CRITICAL CARE MEDICINE   Name: Brady Smith MRN: [id number] DOB: [birth year]    ADMISSION DATE:  05/17/2015 CONSULTATION DATE:  02/26  REFERRING MD:  Adonis Huguenin   Subjective Extubated,off precedex, had several doses of ativan which seemed to have helped, lethargic but arousable  Unable to take PO meds   VITAL SIGNS: BP 141/89 mmHg  Pulse 102  Temp(Src) 97.7 F (36.5 C) (Axillary)  Resp 31  Ht 5\' 8"  (1.727 m)  Wt 157 lb 13.6 oz (71.6 kg)  BMI 24.01 kg/m2  SpO2 99%   INTAKE / OUTPUT: I/O last 3 completed shifts: In: 213.8 [I.V.:213.8] Out: 0    ROS limited due to lethargy  PHYSICAL EXAMINATION: General: NAD, lethargic Neuro: Moves extremities, follows basic commands HEENT: NCAT Cardiovascular: RRR, S1/S2, no MRG Lungs: Bilateral airflow, mild expiratory wheezes, coarse rhonchi, diminished breath sounds bilaterally Abdomen: post op changes, abd soft, diminished to absent BS Ext: warm, no edema  LABS:  BMET  Recent Labs Lab 05/31/15 0440 06/01/15 0453 06/02/15 0623  NA 145 142 147*  K 3.5 3.8 3.8  CL 108 109 113*  CO2 27 28 28   BUN 30* 37* 38*  CREATININE 0.83 0.79 1.03  GLUCOSE 168* 122* 121*    Electrolytes  Recent Labs Lab 05/30/15 0400 05/31/15 0440 06/01/15 0453 06/02/15 0623  CALCIUM 8.0* 8.0* 8.0* 8.3*  MG 2.2  --  2.4 2.6*  PHOS 5.3*  --  5.5* 4.6    CBC  Recent Labs Lab 05/27/15 1247 05/28/15 0918 06/01/15 0453  WBC 6.8 6.1 5.0  HGB 12.0* 11.1* 11.6*  HCT 36.3* 32.8* 35.0*  PLT 195 206 242    Coag's No results for input(s): APTT, INR in the last 168 hours.  Sepsis Markers No results for input(s): LATICACIDVEN, PROCALCITON, O2SATVEN in the last 168 hours.  ABG  Recent Labs Lab 05/28/15 1222  PHART 7.44  PCO2ART 47  PO2ART 78*    Liver Enzymes No results for input(s): AST, ALT, ALKPHOS, BILITOT, ALBUMIN in the last 168 hours.  Cardiac Enzymes No results for input(s): TROPONINI, PROBNP in the last  168 hours.  Glucose  Recent Labs Lab 06/01/15 1222 06/01/15 1535 06/01/15 1956 06/01/15 2356 06/02/15 0345 06/02/15 0731  GLUCAP 114* 99 109* 98 123* 87    No results found. MAJOR EVENTS/TEST RESULTS: VENT DAY #6 02/23 Admission 02/23 IM consult 02/25 Laparotomy, strangulated small bowel, small bowel resection 03/01 EXTUBATED 03/02 RE-INTUBATED 03/05 EXTUBATED  INDWELLING DEVICES:: ETT 02/25 >>   MICRO DATA: Urine 02/23 >> NEG MRSA PCR 02/25 >> NEG REsp culture-CITROBACTER SPECIES ANTIMICROBIALS:  Ertapenem 02/25 >> 3/8  Discussion 70 yo white male with acute s/p laparotomy for acute small bowel infarction complicated by acute resp failure and metabolic encephalopathy and acute etoh withdrawal requiring vent support with CITROBACTER PNEUMONIA, complicated by delerium At this time, I am considering Wernicke's Encephalopathy from chronic ETOH abuse with ICU psychosis as underlying cause for delerium and agitation  ASSESSMENT / PLAN: PULMONARY-resp failure improved -extubated, aspiration precautions -oxygen as needed   GASTROINTESTINAL A:   Acute small bowel infarction S/P laparotomy -follow gen surgery recs  HEMATOLOGIC A:   Very mild thrombocytopenia-resolved P:  DVT px: LMWH Monitor CBC intermittently Transfuse per usual ICU guidelines  INFECTIOUS Citrobacter pneumonia -abx completed  ENDOCRINE A:   Mild hyperglycemia without prior dx of DM P:   Blood glucose monitoring every 4 hours with sliding scale insulin coverage per protocol   NEUROLOGIC A:   Heavy  ETOH use Post op pain -sedatives as needed -will  use ativan as needed more frequently     The Patient requires high complexity decision making for assessment and support, frequent evaluation and titration of therapies.   Brady Smith, M.D.  [facility]  Medical Director [place] Director [facility] Cardio-Pulmonary Department

## 2015-06-02 NOTE — Progress Notes (Signed)
Patient has rested well throughout the night, only becoming restless a few times and responding well to verbal cues and a few doses of IV Ativan.  Vital signs remain stable, patient remains confused and oriented to self only.  Will continue to monitor.

## 2015-06-02 NOTE — Progress Notes (Signed)
Pt has been very lethargic and obtunded this shift. Alert and oriented to self; however, he is able to communicate yes and no questions. Pt is currently on [facility] and lung sounds are ronchus and diminished at the bases. Pt has a weak, non-productive cough. A dysphagia 1/nectar thick liquids diet was ordered today, with 25% and 200 ml consumed at dinner time. Right dorsalis pedis pulse still remains absent with the pinky toe mottling. Report given to next shift RN, Margreta Journey.

## 2015-06-02 NOTE — Progress Notes (Signed)
70 yr old POD#13  incarcerated ventral hernia repair.  Patient still acutely delirious.     Filed Vitals:   06/02/15 1600 06/02/15 1700  BP: 149/80 142/83  Pulse: 102 106  Temp:    Resp: 27 23   I/O last 3 completed shifts: In: 244.7 [P.O.:200; I.V.:44.7] Out: 0      PE:  Gen: NAD Res: crackles in bases Cardio: rRR [id number], wound clean and dry Ext:  no edema  CBC Latest Ref Rng 06/01/2015 05/28/2015 05/27/2015  WBC 3.8 - 10.6 K/uL 5.0 6.1 6.8  Hemoglobin 13.0 - 18.0 g/dL 11.6(L) 11.1(L) 12.0(L)  Hematocrit 40.0 - 52.0 % 35.0(L) 32.8(L) 36.3(L)  Platelets 150 - 440 K/uL 242 206 195   CMP Latest Ref Rng 06/02/2015 06/01/2015 05/31/2015  Glucose 65 - 99 mg/dL 121(H) 122(H) 168(H)  BUN 6 - 20 mg/dL 38(H) 37(H) 30(H)  Creatinine 0.61 - 1.24 mg/dL 1.03 0.79 0.83  Sodium 135 - 145 mmol/L 147(H) 142 145  Potassium 3.5 - 5.1 mmol/L 3.8 3.8 3.5  Chloride 101 - 111 mmol/L 113(H) 109 108  CO2 22 - 32 mmol/L 28 28 27   Calcium 8.9 - 10.3 mg/dL 8.3(L) 8.0(L) 8.0(L)    A/P: 70 yr old POD#13 incarcerated ventral hernia repair.  Surgical issues resolved.  He is on a dysphasia diet.   Acute delirium in elderly patient: would avoid benzos, he has been started on seroquel and haldol prns  SNF or LTAC when able to tolerate diet.

## 2015-06-02 NOTE — Progress Notes (Signed)
Nutrition Follow-up  INTERVENTION:   Coordination of Care: discussed SLP recommendations for diet consistency with MD Mortimer Fries; MD agreeable for diet advancement and orders placed for Dysphagia I, Nectar Thick Diet Medical Food Supplement Therapy: recommend addition of Mighty Shakes and Magic Cups TID with meals  NUTRITION DIAGNOSIS:   Inadequate oral intake related to acute illness, altered GI function as evidenced by NPO status. Being addressed as diet being advanced  GOAL:   Provide needs based on ASPEN/SCCM guidelines  MONITOR:    (PN, Energy Intake, Anthropometrics, Electrolyte/Renal Profile, Glucose Profile)  REASON FOR ASSESSMENT:   Ventilator, Consult New TPN/TNA  ASSESSMENT:    Pt off precedex drip, SLP able to evaluate pt this AM, pt took 4 spoons of appleauce and tolerated nectar thick liquids, SLP recommending Dysphagia I, Nectar Thick diet consistency; pt also possible ischemic toe with possible angio next week, pain medications adjusted as well during rounds  Diet Order:  NPO   Recent Labs Lab 05/30/15 0400 05/31/15 0440 06/01/15 0453 06/02/15 0623  NA 143 145 142 147*  K 3.6 3.5 3.8 3.8  CL 106 108 109 113*  CO2 30 27 28 28   BUN 21* 30* 37* 38*  CREATININE 0.75 0.83 0.79 1.03  CALCIUM 8.0* 8.0* 8.0* 8.3*  MG 2.2  --  2.4 2.6*  PHOS 5.3*  --  5.5* 4.6  GLUCOSE 103* 168* 122* 121*    Glucose Profile:  Recent Labs  06/02/15 0345 06/02/15 0731 06/02/15 1137  GLUCAP 123* 87 110*   Meds: ss novolog, thiamine  Height:   Ht Readings from Last 1 Encounters:  05/18/15 5\' 8"  (1.727 m)    Weight:   Wt Readings from Last 1 Encounters:  06/02/15 156 lb 8.4 oz (71 kg)   Filed Weights   05/31/15 0511 06/01/15 0613 06/02/15 1000  Weight: 157 lb 13.6 oz (71.6 kg) 157 lb 13.6 oz (71.6 kg) 156 lb 8.4 oz (71 kg)    BMI:  Body mass index is 23.81 kg/(m^2).  Estimated Nutritional Needs:   Kcal:  BEE: 1510kcals, TEE: (IF 1.1-1.3)(AF 1.2)  1993-2356kcals  Protein:  93-116g protein (1.2-1.5g/kg)  Fluid:  1925-2310 mL (25-30 ml/kg)   HIGH Care Level  Kerman Passey MS, RD, LDN [phone] Pager  [phone] Weekend/On-Call Pager

## 2015-06-03 LAB — BASIC METABOLIC PANEL
ANION GAP: 4 — AB (ref 5–15)
BUN: 47 mg/dL — ABNORMAL HIGH (ref 6–20)
CHLORIDE: 118 mmol/L — AB (ref 101–111)
CO2: 28 mmol/L (ref 22–32)
Calcium: 8.3 mg/dL — ABNORMAL LOW (ref 8.9–10.3)
Creatinine, Ser: 1.07 mg/dL (ref 0.61–1.24)
GFR calc non Af Amer: >60 mL/min (ref >60)
Glucose, Bld: 133 mg/dL — ABNORMAL HIGH (ref 65–99)
POTASSIUM: 3.6 mmol/L (ref 3.5–5.1)
Sodium: 150 mmol/L — ABNORMAL HIGH (ref 135–145)

## 2015-06-03 LAB — GLUCOSE, CAPILLARY
GLUCOSE-CAPILLARY: 131 mg/dL — AB (ref 65–99)
GLUCOSE-CAPILLARY: 124 mg/dL — AB (ref 65–99)
Glucose-Capillary: 115 mg/dL — ABNORMAL HIGH (ref 65–99)
Glucose-Capillary: 138 mg/dL — ABNORMAL HIGH (ref 65–99)
Glucose-Capillary: 123 mg/dL — ABNORMAL HIGH (ref 65–99)

## 2015-06-03 LAB — MAGNESIUM: Magnesium: 2.5 mg/dL — ABNORMAL HIGH (ref 1.7–2.4)

## 2015-06-03 LAB — SODIUM: Sodium: 147 mmol/L — ABNORMAL HIGH (ref 135–145)

## 2015-06-03 MED ORDER — QUETIAPINE FUMARATE 25 MG PO TABS
100.0000 mg | ORAL_TABLET | Freq: Two times a day (BID) | ORAL | Status: DC
  Administered 2015-06-03 – 2015-06-05 (×4): 100 mg via ORAL
  Filled 2015-06-03 (×4): qty 4

## 2015-06-03 MED ORDER — INSULIN ASPART 100 UNIT/ML ~~LOC~~ SOLN
0.0000 [IU] | Freq: Three times a day (TID) | SUBCUTANEOUS | Status: DC
  Administered 2015-06-03 (×2): 1 [IU] via SUBCUTANEOUS
  Administered 2015-06-04: 2 [IU] via SUBCUTANEOUS
  Administered 2015-06-05: 3 [IU] via SUBCUTANEOUS
  Administered 2015-06-05: 2 [IU] via SUBCUTANEOUS
  Administered 2015-06-06: 1 [IU] via SUBCUTANEOUS
  Administered 2015-06-06: 2 [IU] via SUBCUTANEOUS
  Administered 2015-06-06: 1 [IU] via SUBCUTANEOUS
  Administered 2015-06-07: 3 [IU] via SUBCUTANEOUS
  Administered 2015-06-07: 1 [IU] via SUBCUTANEOUS
  Administered 2015-06-07 – 2015-06-08 (×3): 2 [IU] via SUBCUTANEOUS
  Filled 2015-06-03: qty 1
  Filled 2015-06-03: qty 3
  Filled 2015-06-03: qty 1
  Filled 2015-06-03: qty 2
  Filled 2015-06-03: qty 1
  Filled 2015-06-03: qty 2
  Filled 2015-06-03: qty 3
  Filled 2015-06-03 (×5): qty 2
  Filled 2015-06-03 (×2): qty 1

## 2015-06-03 MED ORDER — LORAZEPAM 2 MG/ML IJ SOLN
0.5000 mg | INTRAMUSCULAR | Status: DC | PRN
  Administered 2015-06-03: 0.5 mg via INTRAVENOUS
  Filled 2015-06-03: qty 1

## 2015-06-03 MED ORDER — MAGNESIUM OXIDE 400 (241.3 MG) MG PO TABS: 400.0000 mg | ORAL_TABLET | Freq: Two times a day (BID) | ORAL | Status: DC

## 2015-06-03 MED ORDER — INSULIN ASPART 100 UNIT/ML ~~LOC~~ SOLN: 0.0000 [IU] | Freq: Every day | SUBCUTANEOUS | Status: DC

## 2015-06-03 MED ORDER — MEGESTROL ACETATE 40 MG/ML PO SUSP
400.0000 mg | Freq: Two times a day (BID) | ORAL | Status: DC
Start: 2015-06-03 — End: 2015-06-03
  Filled 2015-06-03 (×2): qty 10

## 2015-06-03 MED ORDER — DEXMEDETOMIDINE HCL IN NACL 400 MCG/100ML IV SOLN: 0.0000 ug/kg/h | INTRAVENOUS | Status: DC

## 2015-06-03 MED ORDER — HALOPERIDOL LACTATE 5 MG/ML IJ SOLN
1.0000 mg | INTRAMUSCULAR | Status: DC | PRN
  Administered 2015-06-03: 1 mg via INTRAVENOUS
  Filled 2015-06-03: qty 1

## 2015-06-03 MED ORDER — METOPROLOL TARTRATE 1 MG/ML IV SOLN
5.0000 mg | Freq: Four times a day (QID) | INTRAVENOUS | Status: DC
  Administered 2015-06-03 – 2015-06-04 (×4): 5 mg via INTRAVENOUS
  Filled 2015-06-03 (×4): qty 5

## 2015-06-03 MED ORDER — METOPROLOL TARTRATE 1 MG/ML IV SOLN
2.5000 mg | INTRAVENOUS | Status: DC | PRN
  Administered 2015-06-04: 5 mg via INTRAVENOUS
  Filled 2015-06-03 (×3): qty 5

## 2015-06-03 MED ORDER — DEXTROSE 5 % IV SOLN
INTRAVENOUS | Status: AC
  Administered 2015-06-03 – 2015-06-04 (×2): via INTRAVENOUS

## 2015-06-03 NOTE — Progress Notes (Signed)
Notified Dr. Marcille Blanco of high sodium and low magnesium. Orders placed and initiated.

## 2015-06-03 NOTE — Progress Notes (Signed)
70 yr old POD#14  incarcerated ventral hernia repair.  Patient sitting up and answering questions.  He denies abdominal pain today.  States he is hungry and thirsty and that his throat hurts.  He is only oriented to self, but calm at time of exam.      Filed Vitals:   06/03/15 0900 06/03/15 1059  BP: 142/84 138/91  Pulse: 102   Temp:    Resp: 24    I/O last 3 completed shifts: In: 428 [P.O.:428] Out: -  Total I/O In: 150 [P.O.:150] Out: -    PE:  Gen: NAD Res: crackles in bases Cardio: rRR [id number], wound clean and dry Ext:  no edema  CBC Latest Ref Rng 06/01/2015 05/28/2015 05/27/2015  WBC 3.8 - 10.6 K/uL 5.0 6.1 6.8  Hemoglobin 13.0 - 18.0 g/dL 11.6(L) 11.1(L) 12.0(L)  Hematocrit 40.0 - 52.0 % 35.0(L) 32.8(L) 36.3(L)  Platelets 150 - 440 K/uL 242 206 195   CMP Latest Ref Rng 06/03/2015 06/02/2015 06/01/2015  Glucose 65 - 99 mg/dL 133(H) 121(H) 122(H)  BUN 6 - 20 mg/dL 47(H) 38(H) 37(H)  Creatinine 0.61 - 1.24 mg/dL 1.07 1.03 0.79  Sodium 135 - 145 mmol/L 150(H) 147(H) 142  Potassium 3.5 - 5.1 mmol/L 3.6 3.8 3.8  Chloride 101 - 111 mmol/L 118(H) 113(H) 109  CO2 22 - 32 mmol/L 28 28 28   Calcium 8.9 - 10.3 mg/dL 8.3(L) 8.3(L) 8.0(L)    A/P: 70 yr old POD#14 incarcerated ventral hernia repair.  Surgical issues resolved.  He is on a dysphasia diet.   Acute delirium in elderly patient: would avoid benzos, his seroquel was increased and continuing Ativan prns as well   Can likely transfer from ICU tomorrow with sitter.   SNF or LTAC when able to tolerate diet.

## 2015-06-03 NOTE — Progress Notes (Addendum)
Patient ID: Brady Smith, male   DOB: Oct 27, 1945, 70 y.o.   MRN: [id number] [facility] PROGRESS NOTE  Brady Smith [id number] DOB: 06-30-1945 DOA: 05/17/2015 PCP: Halina Maidens, MD  HPI/Subjective: Patient more awake. Hard to understand but trying to communicate.  Objective: Filed Vitals:   06/03/15 0800 06/03/15 0900  BP: 143/87 142/84  Pulse: 97 102  Temp: 98 F (36.7 C)   Resp: 24 24    Filed Weights   06/01/15 0613 06/02/15 1000 06/03/15 0500  Weight: 71.6 kg (157 lb 13.6 oz) 71 kg (156 lb 8.4 oz) 68.8 kg (151 lb 10.8 oz)    ROS: Review of Systems  Unable to perform ROS  Limited secondary to altered mental status Exam: Physical Exam  Constitutional:Chronically ill-appearing  HENT:  Nose: No mucosal edema.  Mouth/Throat: No oropharyngeal exudate or posterior oropharyngeal edema.  Eyes: Conjunctivae and lids are normal. Pupils are equal, round, and reactive to light.  Neck: No JVD present. Carotid bruit is not present. No edema present. No thyroid mass and no thyromegaly present.  Cardiovascular: S1 normal, S2 normal and normal heart sounds.  Exam reveals no gallop.   No murmur heard. Pulses:      Dorsalis pedis pulses are 0 on the right side, and 1+ on the left side.  Respiratory: No respiratory distress. He has no wheezes. Occasional rhonchi GI: Soft. Bowel sounds are normal. He exhibits no distension. There is no tenderness.  Musculoskeletal:       Right ankle: He exhibits no swelling.       Left ankle: He exhibits no swelling.  Lymphadenopathy:    He has no cervical adenopathy.  Neurological: Awake moving all 4 extremities spontaneous  Skin: Skin is warm. Nails show no clubbing.  Right foot cool to touch. Right fifth toe starting to turn a brownish blackish discoloration  Psychiatric:   this is not anxious or depressed currently      Data Reviewed: Basic Metabolic Panel:  Recent Labs Lab 05/28/15 0918 05/29/15 0822 05/30/15 0400  05/31/15 0440 06/01/15 0453 06/02/15 0623 06/03/15 0513  NA 138 140 143 145 142 147* 150*  K 3.6 3.4* 3.6 3.5 3.8 3.8 3.6  CL 102 103 106 108 109 113* 118*  CO2 31 31 30 27 28 28 28   GLUCOSE 179* 192* 103* 168* 122* 121* 133*  BUN 23* 20 21* 30* 37* 38* 47*  CREATININE 0.86 0.82 0.75 0.83 0.79 1.03 1.07  CALCIUM 7.4* 7.7* 8.0* 8.0* 8.0* 8.3* 8.3*  MG 2.2 2.1 2.2  --  2.4 2.6* 2.5*  PHOS 3.6 3.1 5.3*  --  5.5* 4.6  --    CBC:  Recent Labs Lab 05/27/15 1247 05/28/15 0918 06/01/15 0453  WBC 6.8 6.1 5.0  NEUTROABS 6.0  --   --   HGB 12.0* 11.1* 11.6*  HCT 36.3* 32.8* 35.0*  MCV 97.6 94.0 95.3  PLT 195 206 242    CBG:  Recent Labs Lab 06/02/15 1616 06/02/15 1929 06/02/15 2334 06/03/15 0325 06/03/15 0725  GLUCAP 96 126* 126* 115* 131*    Recent Results (from the past 240 hour(s))  CULTURE, BLOOD (ROUTINE X 2) w Reflex to PCR ID Panel     Status: None   Collection Time: 05/27/15  1:26 PM  Result Value Ref Range Status   Specimen Description BLOOD RIGHT HAND  Final   Special Requests BOTTLES DRAWN AEROBIC AND ANAEROBIC 3CC  Final   Culture NO GROWTH 5 DAYS  Final  Report Status 06/01/2015 FINAL  Final  CULTURE, BLOOD (ROUTINE X 2) w Reflex to PCR ID Panel     Status: None   Collection Time: 05/27/15  1:33 PM  Result Value Ref Range Status   Specimen Description BLOOD LEFT HAND  Final   Special Requests BOTTLES DRAWN AEROBIC AND ANAEROBIC 5CC  Final   Culture NO GROWTH 5 DAYS  Final   Report Status 06/01/2015 FINAL  Final     Scheduled Meds: . antiseptic oral rinse  7 mL Mouth Rinse q12n4p  . budesonide (PULMICORT) nebulizer solution  0.5 mg Nebulization BID  . chlorhexidine  15 mL Mouth Rinse BID  . diltiazem  120 mg Oral Daily  . enoxaparin (LOVENOX) injection  40 mg Subcutaneous Q24H  . insulin aspart  1-3 Units Subcutaneous 6 times per day  . ipratropium-albuterol  3 mL Nebulization Q6H  . megestrol  400 mg Oral BID  . pantoprazole  40 mg Oral Daily   . QUEtiapine  50 mg Oral QHS  . QUEtiapine  50 mg Oral BID  . thiamine IV  100 mg Intravenous Daily    Assessment/Plan:  1. Acute encephalopathy - improved with current therapy. 2. Drug fever to invanz. Fever curve trended better. Now off antibiotics 3. Incarcerated ventral hernia. Status post removal of necrotic bowel and incarcerated hernia repair. I will add Megace to increase his appetite. 4. Alcohol withdrawal. Resolving 5. Acute respiratory failure with hypoxia. Patient was reintubated 05/24/2015 secondary to encephalopathy. Patient extubated 05/28/2015. Currently on room air continue nebulizers  6. Atrial fibrillation. On Cardizem CD. Hold anticoagulation at this point. Not a good candidate due to fall risk 7. History of COPD on  Pulmicort nebulizers 8. Hypernatremia due to free water deficit will increase his dextrose containing fluid 9. Decreased pulse right lower extremity. Vascular surgery consultation appreciated. Likely peripheral vascular disease. Right fifth toe starting to turn blackish discoloration Weakness- needs physical therapy and rehabilitation Code Status:     Code Status Orders        Start     Ordered   05/18/15 0352  Full code   Continuous     05/18/15 0351    Code Status History    Date Active Date Inactive Code Status Order ID Comments User Context   This patient has a current code status but no historical code status.    Advance Directive Documentation        Most Recent Value   Type of Advance Directive  Healthcare Power of Attorney   Pre-existing out of facility DNR order (yellow form or pink MOST form)     "MOST" Form in Place?       Disposition Plan: Determination on whether the patient can eat on his own prior to discharge out to a rehabilitation  Patient can be transferred to floor if okay with surgery.    Consultants:  Critical care specialist  Surgery  Vascular surgery  Infectious disease  Time spent: 20 minutes  [place],  [place]

## 2015-06-03 NOTE — Progress Notes (Signed)
Pharmacy ICU Daily Progress Note  PN is a 69yo admitted 05/17/15 for SBO, hernia with AMS 2/2 possible Wernicke's from EtOH abuse/detox. Of note, patient has poor pedal pulses and blue toe, morphine ordered.   Active pharmacy consults: Electrolyte/glucose management  Plan: 1. 3/11 Electrolytes- Magnesium slightly elevated at 2.5.  Continue to monitor and adjust per protocol.  2. Glucose: Pt on insulin aspart 1-3 units q4hrs. CBGs 121, 122, 168. Continue to monitor.   3/11- pt started on D5W at 110 mlhr x 1 day.  Infectious:  Antimicrobials: None WBC 5 (3/9) Afebrile Culture Results: 3/4 BCx x2 NGTD (5 days)  Electrolytes:  Potassium: 3.6 Magnesium: 2.5 Phosphorus: 4.6 (3/10) Supplementation plans: None at this time  Pulmonary: Bronchodilators? Budesonide 0.5 mg BID, duonebs q6hr Ventilator status: No O2 sat/FiO2: 99 on 2LNC  Current steroids: None Taper plans:   GI:  Constipation PPx: None (follow up on addition of an agent now that morphine has been added for prn pain0 Feeding status: NPO LBM: 06/02/15 SUP: pantoprazole 40mg  PT daily  Insulin:  SSI use in 24hrs: 1 units Current Insulin orders: SSI 1-3 units q 4 hrs Last 3 CBGs: 126, 115, 131  DVT PPx: lovenox 40mg  SQ q24hr  Sedation+Pain: precedex off, ativan prn, haldol prn, started seroquel 50mg  QHS today. Morphine ordered for pain. RASS goal?  GCS 13 Last pain score: 0 Opioid use in last 24 hrs:   Pressors: none MAP goal:   Medication education/counseling required?

## 2015-06-03 NOTE — Progress Notes (Signed)
Po intake still poor but no s/s of aspiration with current dysphagia 1 with nectar thick liquids reported. F/u 1-3 days

## 2015-06-03 NOTE — Progress Notes (Signed)
Transferred from ICU. Alert but confused. Trying to get out of bed. Will continue to monitor.

## 2015-06-03 NOTE — Progress Notes (Addendum)
Very confused, poorly oriented. No overt respiratory distress. No complaints of pain  Filed Vitals:   06/03/15 1353 06/03/15 1400 06/03/15 1500 06/03/15 1600  BP:  152/101 162/64 146/84  Pulse:  94 99 99  Temp:      TempSrc:      Resp:  28 23 24   Height:      Weight:      SpO2: 99% 100% 96% 100%   Agitated, RASS +1, + F/C HEENT WNL No wheezes RRR with occasional extrasystoles (PACs) Abd soft Ext warm, no edema MAEs  BMP Latest Ref Rng 06/03/2015 06/02/2015 06/01/2015  Glucose 65 - 99 mg/dL 133(H) 121(H) 122(H)  BUN 6 - 20 mg/dL 47(H) 38(H) 37(H)  Creatinine 0.61 - 1.24 mg/dL 1.07 1.03 0.79  Sodium 135 - 145 mmol/L 150(H) 147(H) 142  Potassium 3.5 - 5.1 mmol/L 3.6 3.8 3.8  Chloride 101 - 111 mmol/L 118(H) 113(H) 109  CO2 22 - 32 mmol/L 28 28 28   Calcium 8.9 - 10.3 mg/dL 8.3(L) 8.3(L) 8.0(L)    CBC Latest Ref Rng 06/01/2015 05/28/2015 05/27/2015  WBC 3.8 - 10.6 K/uL 5.0 6.1 6.8  Hemoglobin 13.0 - 18.0 g/dL 11.6(L) 11.1(L) 12.0(L)  Hematocrit 40.0 - 52.0 % 35.0(L) 32.8(L) 36.3(L)  Platelets 150 - 440 K/uL 242 206 195    No new CXR  IMPRESSION: Heavy smoker without prior dx of COPD Post op vent status - resolved Pot op hypotension - resolved Hypernatremia, on D5W S/P laparotomy for incarcerated ventral hernia Thrombocytopenia - resolved Heavy alcohol abuse Hospital associated delirium  PLAN/REC: Cont supplemental O2 as needed Continue nebulized steroids and bronchodilators Continue free water repletion Post op mgmt including advancement of diet per surgery Increase Quetiapine Cont low dose PRN lorazepam Agree with transfer to med-surg Discussed with Dr Azalee Course  PCCM will F/U again Monday 03/13  Merton Border, MD PCCM service Mobile [phone] Pager [phone] 06/03/2015

## 2015-06-04 LAB — GLUCOSE, CAPILLARY
GLUCOSE-CAPILLARY: 160 mg/dL — AB (ref 65–99)
GLUCOSE-CAPILLARY: 100 mg/dL — AB (ref 65–99)
Glucose-Capillary: 95 mg/dL (ref 65–99)
Glucose-Capillary: 97 mg/dL (ref 65–99)

## 2015-06-04 LAB — BASIC METABOLIC PANEL
ANION GAP: 9 (ref 5–15)
BUN: 38 mg/dL — ABNORMAL HIGH (ref 6–20)
CHLORIDE: 115 mmol/L — AB (ref 101–111)
CO2: 24 mmol/L (ref 22–32)
Calcium: 8.4 mg/dL — ABNORMAL LOW (ref 8.9–10.3)
Creatinine, Ser: 1.1 mg/dL (ref 0.61–1.24)
GFR calc non Af Amer: >60 mL/min (ref >60)
Glucose, Bld: 133 mg/dL — ABNORMAL HIGH (ref 65–99)
POTASSIUM: 3.4 mmol/L — AB (ref 3.5–5.1)
Sodium: 148 mmol/L — ABNORMAL HIGH (ref 135–145)

## 2015-06-04 LAB — CBC
HCT: 41.1 % (ref 40.0–52.0)
HEMOGLOBIN: 13.6 g/dL (ref 13.0–18.0)
MCH: 31.6 pg (ref 26.0–34.0)
MCHC: 33.1 g/dL (ref 32.0–36.0)
MCV: 95.3 fL (ref 80.0–100.0)
PLATELETS: 256 10*3/uL (ref 150–440)
RBC: 4.32 MIL/uL — AB (ref 4.40–5.90)
RDW: 13.7 % (ref 11.5–14.5)
WBC: 10.1 10*3/uL (ref 3.8–10.6)

## 2015-06-04 LAB — SODIUM: SODIUM: 150 mmol/L — AB (ref 135–145)

## 2015-06-04 LAB — MAGNESIUM: Magnesium: 2 mg/dL (ref 1.7–2.4)

## 2015-06-04 MED ORDER — MORPHINE SULFATE (PF) 2 MG/ML IV SOLN: 2.0000 mg | INTRAVENOUS | Status: DC | PRN

## 2015-06-04 MED ORDER — LORAZEPAM 2 MG/ML IJ SOLN: 0.5000 mg | INTRAMUSCULAR | Status: DC

## 2015-06-04 MED ORDER — METOPROLOL TARTRATE 25 MG PO TABS
25.0000 mg | ORAL_TABLET | Freq: Two times a day (BID) | ORAL | Status: DC
  Administered 2015-06-04 – 2015-06-08 (×8): 25 mg via ORAL
  Filled 2015-06-04 (×8): qty 1

## 2015-06-04 MED ORDER — HALOPERIDOL LACTATE 5 MG/ML IJ SOLN
5.0000 mg | INTRAMUSCULAR | Status: DC | PRN
  Administered 2015-06-04: 5 mg via INTRAVENOUS
  Filled 2015-06-04: qty 1

## 2015-06-04 MED ORDER — HALOPERIDOL LACTATE 5 MG/ML IJ SOLN: 1.0000 mg | INTRAMUSCULAR | Status: DC | PRN

## 2015-06-04 MED ORDER — LORAZEPAM 2 MG/ML IJ SOLN: 0.5000 mg | INTRAMUSCULAR | Status: DC | PRN

## 2015-06-04 MED ORDER — POTASSIUM CHLORIDE 20 MEQ PO PACK
20.0000 meq | PACK | Freq: Once | ORAL | Status: AC
  Administered 2015-06-04: 20 meq via ORAL
  Filled 2015-06-04: qty 1

## 2015-06-04 NOTE — Progress Notes (Signed)
Pharmacy ICU Daily Progress Note  PN is a 69yo admitted 05/17/15 for SBO, hernia with AMS 2/2 possible Wernicke's from EtOH abuse/detox. Of note, patient has poor pedal pulses and blue toe, morphine ordered.   Active pharmacy consults: Electrolyte/glucose management  Plan: 1. 3/12 Electrolytes- K=3.4. Will give Potassium 72meq x 1 po. Continue to monitor and adjust per protocol.  2. Glucose: Pt on insulin aspart 1-3 units q4hrs. Last 3 CBGs 123, 124, 168. Continue to monitor.   SSI/24hr= 5 units.  3/11- pt started on D5W at 110 mlhr x 1 day.  Infectious:  Antimicrobials: None WBC 5 (3/9) Afebrile Culture Results: 3/4 BCx x2 NGTD (5 days)  Electrolytes:  Potassium: 3.4 Magnesium: 2.0 Phosphorus: 4.6 (3/10) Supplementation plans: KLOR-con po 41meq x 1  Pulmonary: Bronchodilators? Budesonide 0.5 mg BID, duonebs q6hr Ventilator status: No O2 sat/FiO2: 99 on 2LNC  Current steroids: None Taper plans:   GI:  Constipation PPx: None (follow up on addition of an agent now that morphine has been added for prn pain0 Feeding status: NPO LBM: 06/02/15 SUP: pantoprazole 40mg  PT daily  Insulin:  SSI use in 24hrs: 1 units Current Insulin orders: SSI 1-3 units q 4 hrs Last 3 CBGs: 126, 115, 131  DVT PPx: lovenox 40mg  SQ q24hr  Sedation+Pain: precedex off, ativan prn, haldol prn, started seroquel 50mg  QHS today. Morphine ordered for pain. RASS goal?  GCS 13 Last pain score: 0 Opioid use in last 24 hrs:   Pressors: none MAP goal:   Medication education/counseling required?

## 2015-06-04 NOTE — Progress Notes (Signed)
70 yr old POD#15  incarcerated ventral hernia repair.  Patient needed haldol earlier today.  He is now calm and opening eyes for about 20secs.  Sometimes will answer questions.       Filed Vitals:   06/04/15 0524 06/04/15 0631  BP:  156/77  Pulse: 109 120  Temp: 101 F (38.3 C) 100.3 F (37.9 C)  Resp:     I/O last 3 completed shifts: In: 2052.8 [P.O.:378; I.V.:1674.8] Out: -      PE:  Gen: NAD Res: crackles in bases, with some upper respiratory gurgling but clears when coughs Cardio: RRR [id number], wound clean and dry Ext:  no edema   CBC Latest Ref Rng 06/04/2015 06/01/2015 05/28/2015  WBC 3.8 - 10.6 K/uL 10.1 5.0 6.1  Hemoglobin 13.0 - 18.0 g/dL 13.6 11.6(L) 11.1(L)  Hematocrit 40.0 - 52.0 % 41.1 35.0(L) 32.8(L)  Platelets 150 - 440 K/uL 256 242 206   CMP Latest Ref Rng 06/04/2015 06/03/2015 06/03/2015  Glucose 65 - 99 mg/dL 133(H) - 133(H)  BUN 6 - 20 mg/dL 38(H) - 47(H)  Creatinine 0.61 - 1.24 mg/dL 1.10 - 1.07  Sodium 135 - 145 mmol/L 148(H) 147(H) 150(H)  Potassium 3.5 - 5.1 mmol/L 3.4(L) - 3.6  Chloride 101 - 111 mmol/L 115(H) - 118(H)  CO2 22 - 32 mmol/L 24 - 28  Calcium 8.9 - 10.3 mg/dL 8.4(L) - 8.3(L)    A/P: 69 yr old POD#15 incarcerated ventral hernia repair.  Surgical issues resolved.  He is on a dysphasia diet.   Acute delirium in elderly patient: seroquel 100 BID, haldol decreased to 1mg  prns, Ativan 0.5prns if needed  Continue dysphasia diet, as delirium improves will be able to advance diet  Likely to LTAC or SNF next week

## 2015-06-04 NOTE — Progress Notes (Signed)
Patient ID: Brady Smith, male   DOB: Jan 25, 1946, 70 y.o.   MRN: [id number] [facility] PROGRESS NOTE  MARIOALBERTO JEZ [id number] DOB: Oct 01, 1945 DOA: 05/17/2015 PCP: Halina Maidens, MD  HPI/Subjective: More confused today. Has to be receiving haloperidol for agitation.  Objective: Filed Vitals:   06/04/15 0524 06/04/15 0631  BP:  156/77  Pulse: 109 120  Temp: 101 F (38.3 C) 100.3 F (37.9 C)  Resp:      Filed Weights   06/02/15 1000 06/03/15 0500 06/04/15 0631  Weight: 71 kg (156 lb 8.4 oz) 68.8 kg (151 lb 10.8 oz) 67.903 kg (149 lb 11.2 oz)    ROS: Review of Systems  Unable to perform ROS  Limited secondary to altered mental status Exam: Physical Exam  Constitutional:Chronically ill-appearing  HENT:  Nose: No mucosal edema.  Mouth/Throat: No oropharyngeal exudate or posterior oropharyngeal edema.  Eyes: Conjunctivae and lids are normal. Pupils are equal, round, and reactive to light.  Neck: No JVD present. Carotid bruit is not present. No edema present. No thyroid mass and no thyromegaly present.  Cardiovascular: S1 normal, S2 normal and normal heart sounds.  Exam reveals no gallop.   No murmur heard. Pulses:      Dorsalis pedis pulses are 0 on the right side, and 1+ on the left side.  Respiratory: No respiratory distress. He has no wheezes. Occasional rhonchi GI: Soft. Bowel sounds are normal. He exhibits no distension. There is no tenderness.  Musculoskeletal:       Right ankle: He exhibits no swelling.       Left ankle: He exhibits no swelling.  Lymphadenopathy:    He has no cervical adenopathy.  Neurological: Awake moving all 4 extremities spontaneous  Skin: Skin is warm. Nails show no clubbing.  Right foot cool to touch. Right fifth toe  brownish blackish discoloration  Psychiatric:   currently agitated     Data Reviewed: Basic Metabolic Panel:  Recent Labs Lab 05/29/15 0822 05/30/15 0400 05/31/15 0440 06/01/15 0453 06/02/15 0623  06/03/15 0513 06/03/15 1856 06/04/15 0352  NA 140 143 145 142 147* 150* 147* 148*  K 3.4* 3.6 3.5 3.8 3.8 3.6  --  3.4*  CL 103 106 108 109 113* 118*  --  115*  CO2 31 30 27 28 28 28   --  24  GLUCOSE 192* 103* 168* 122* 121* 133*  --  133*  BUN 20 21* 30* 37* 38* 47*  --  38*  CREATININE 0.82 0.75 0.83 0.79 1.03 1.07  --  1.10  CALCIUM 7.7* 8.0* 8.0* 8.0* 8.3* 8.3*  --  8.4*  MG 2.1 2.2  --  2.4 2.6* 2.5*  --  2.0  PHOS 3.1 5.3*  --  5.5* 4.6  --   --   --    CBC:  Recent Labs Lab 06/01/15 0453 06/04/15 0352  WBC 5.0 10.1  HGB 11.6* 13.6  HCT 35.0* 41.1  MCV 95.3 95.3  PLT 242 256    CBG:  Recent Labs Lab 06/03/15 0725 06/03/15 1054 06/03/15 1702 06/03/15 2106 06/04/15 0738  GLUCAP 131* 138* 123* 124* 160*    Recent Results (from the past 240 hour(s))  CULTURE, BLOOD (ROUTINE X 2) w Reflex to PCR ID Panel     Status: None   Collection Time: 05/27/15  1:26 PM  Result Value Ref Range Status   Specimen Description BLOOD RIGHT HAND  Final   Special Requests BOTTLES DRAWN AEROBIC AND ANAEROBIC 3CC  Final  Culture NO GROWTH 5 DAYS  Final   Report Status 06/01/2015 FINAL  Final  CULTURE, BLOOD (ROUTINE X 2) w Reflex to PCR ID Panel     Status: None   Collection Time: 05/27/15  1:33 PM  Result Value Ref Range Status   Specimen Description BLOOD LEFT HAND  Final   Special Requests BOTTLES DRAWN AEROBIC AND ANAEROBIC 5CC  Final   Culture NO GROWTH 5 DAYS  Final   Report Status 06/01/2015 FINAL  Final     Scheduled Meds: . antiseptic oral rinse  7 mL Mouth Rinse q12n4p  . budesonide (PULMICORT) nebulizer solution  0.5 mg Nebulization BID  . chlorhexidine  15 mL Mouth Rinse BID  . diltiazem  120 mg Oral Daily  . enoxaparin (LOVENOX) injection  40 mg Subcutaneous Q24H  . insulin aspart  0-5 Units Subcutaneous QHS  . insulin aspart  0-9 Units Subcutaneous TID WC  . ipratropium-albuterol  3 mL Nebulization Q6H  . metoprolol tartrate  25 mg Oral BID  . pantoprazole   40 mg Oral Daily  . potassium chloride  20 mEq Oral Once  . QUEtiapine  100 mg Oral BID  . thiamine IV  100 mg Intravenous Daily    Assessment/Plan:  1. Acute encephalopathy - waxing and waning pulmonary try to decrease his haloperidol 2. Fever was initially thought to be due to drug fever patient recurrent fever now. At this point will get a chest x-ray follow blood cultures 3. Incarcerated ventral hernia. Status post removal of necrotic bowel and incarcerated hernia repair. I will started on Megace am not sure he'll be able to take at this point 4. Alcohol withdrawal. Should have resolved. Now has acute encephalopathy likely related to hospitalization and other issues 5. Acute respiratory failure with hypoxia. Patient was reintubated 05/24/2015 secondary to encephalopathy. Patient extubated 05/28/2015. Back on nasal cannula oxygen  6. Atrial fibrillation. On Cardizem CD. Hold anticoagulation at this point. Not a good candidate due to fall risk 7. History of COPD on  Pulmicort nebulizers 8. Hypernatremia due to free water deficit continued him on 9. Decreased pulse right lower extremity. Vascular surgery consultation appreciated. Likely peripheral vascular disease. Right fifth toe starting to turn blackish discoloration Weakness- needs physical therapy and rehabilitation Code Status:     Code Status Orders        Start     Ordered   05/18/15 0352  Full code   Continuous     05/18/15 0351    Code Status History    Date Active Date Inactive Code Status Order ID Comments User Context   This patient has a current code status but no historical code status.    Advance Directive Documentation        Most Recent Value   Type of Advance Directive  Healthcare Power of Attorney   Pre-existing out of facility DNR order (yellow form or pink MOST form)     "MOST" Form in Place?       Disposition Plan: Determination on whether the patient can eat on his own prior to discharge out to a  rehabilitation   Prognosis of this patient is very poor. Time spent: 20 minutes  Abigail Teall, [facility]

## 2015-06-05 ENCOUNTER — Inpatient Hospital Stay: Payer: Medicare PPO

## 2015-06-05 DIAGNOSIS — J441 Chronic obstructive pulmonary disease with (acute) exacerbation: Secondary | ICD-10-CM

## 2015-06-05 LAB — BASIC METABOLIC PANEL
Anion gap: 8 (ref 5–15)
BUN: 47 mg/dL — AB (ref 6–20)
CALCIUM: 8.7 mg/dL — AB (ref 8.9–10.3)
CO2: 23 mmol/L (ref 22–32)
CREATININE: 1.13 mg/dL (ref 0.61–1.24)
Chloride: 120 mmol/L — ABNORMAL HIGH (ref 101–111)
Glucose, Bld: 125 mg/dL — ABNORMAL HIGH (ref 65–99)
Potassium: 4 mmol/L (ref 3.5–5.1)
SODIUM: 151 mmol/L — AB (ref 135–145)

## 2015-06-05 LAB — SODIUM
SODIUM: 152 mmol/L — AB (ref 135–145)
Sodium: 154 mmol/L — ABNORMAL HIGH (ref 135–145)

## 2015-06-05 LAB — GLUCOSE, CAPILLARY
GLUCOSE-CAPILLARY: 151 mg/dL — AB (ref 65–99)
GLUCOSE-CAPILLARY: 188 mg/dL — AB (ref 65–99)
Glucose-Capillary: 116 mg/dL — ABNORMAL HIGH (ref 65–99)
Glucose-Capillary: 232 mg/dL — ABNORMAL HIGH (ref 65–99)

## 2015-06-05 LAB — MAGNESIUM: MAGNESIUM: 2.5 mg/dL — AB (ref 1.7–2.4)

## 2015-06-05 MED ORDER — DEXTROSE IN LACTATED RINGERS 5 % IV SOLN
INTRAVENOUS | Status: DC
  Administered 2015-06-05: 13:00:00 via INTRAVENOUS

## 2015-06-05 MED ORDER — DEXTROSE 5 % IV SOLN: INTRAVENOUS | Status: DC

## 2015-06-05 MED ORDER — LACTATED RINGERS IV SOLN: INTRAVENOUS | Status: DC

## 2015-06-05 MED ORDER — DEXTROSE IN LACTATED RINGERS 5 % IV SOLN
INTRAVENOUS | Status: DC
  Administered 2015-06-06: 04:00:00 via INTRAVENOUS

## 2015-06-05 MED ORDER — IPRATROPIUM-ALBUTEROL 0.5-2.5 (3) MG/3ML IN SOLN
3.0000 mL | RESPIRATORY_TRACT | Status: DC
  Administered 2015-06-05 – 2015-06-08 (×16): 3 mL via RESPIRATORY_TRACT
  Filled 2015-06-05 (×16): qty 3

## 2015-06-05 MED ORDER — METHYLPREDNISOLONE SODIUM SUCC 40 MG IJ SOLR
40.0000 mg | Freq: Two times a day (BID) | INTRAMUSCULAR | Status: DC
  Administered 2015-06-05 – 2015-06-08 (×6): 40 mg via INTRAVENOUS
  Filled 2015-06-05 (×6): qty 1

## 2015-06-05 MED ORDER — SODIUM CHLORIDE 0.9 % IV SOLN
3.0000 g | Freq: Four times a day (QID) | INTRAVENOUS | Status: DC
  Administered 2015-06-05 – 2015-06-08 (×13): 3 g via INTRAVENOUS
  Filled 2015-06-05 (×17): qty 3

## 2015-06-05 NOTE — Progress Notes (Signed)
Physical Therapy Treatment Patient Details Name: Brady Smith MRN: [id number] DOB: 06-20-1945 Today's Date: 06/05/2015    History of Present Illness presented to ER secondary to worsening abdominal hernia (previous recommended for follow up at tertiary care center); admitted with SBO related to incarcerated ventral hernia.  Status post exploratory laparotomy with small bowel resection (2/25); post-op course complicated by post-op shock, ETOH withdrawal, prolonged intubation (2/25-3/1, 3/2-3/5), afib rate-controlled and mild ileus.  Intermittent restless and agitated, requiring ativan/precedex for management.    PT Comments    Patient with improved alertness this date, but remains generally confused with limited ability to follow structured commands.  Requiring extensive assist +2 (max/total +2) for all bed mobility and unsupported sitting efforts.  Was able to initiate OOB to chair this date (lateral scoot pivot, total +2), though with total assist for trunk control and lateral movement. Anticipate need for total lift vs. scoot pivot +2 for return to bed; RN/CNA informed and aware. Mild SOB with exertion this date, sats 87-88%; increased to 3L per RN with O2 at 88-89% end of session.   Follow Up Recommendations  SNF     Equipment Recommendations       Recommendations for Other Services       Precautions / Restrictions Precautions Precautions: Fall Precaution Comments: Nectar, CIWA Restrictions Weight Bearing Restrictions: No    Mobility  Bed Mobility Overal bed mobility: Needs Assistance Bed Mobility: Supine to Sit Rolling: Total assist;+2 for physical assistance   Supine to sit: Total assist;+2 for physical assistance     General bed mobility comments: hand-over-hand assist for UE placement  Transfers Overall transfer level: Needs assistance   Transfers: Lateral/Scoot Transfers          Lateral/Scoot Transfers: Total assist;+2 safety/equipment;+2 physical  assistance General transfer comment: constant assist for sitting balance, core stabiltiy and lateral movement during transfer  Ambulation/Gait             General Gait Details: unsafe/unable   Stairs            Wheelchair Mobility    Modified Rankin (Stroke Patients Only)       Balance Overall balance assessment: Needs assistance Sitting-balance support: No upper extremity supported;Feet supported Sitting balance-Leahy Scale: Zero Sitting balance - Comments: max/dep for all sitting balance efforts during session-absent spontaneous balance/righting reactions                            Cognition Arousal/Alertness: Awake/alert Behavior During Therapy: WFL for tasks assessed/performed Overall Cognitive Status: Difficult to assess                      Exercises Other Exercises Other Exercises: Unable to follow commands for consistent participation with isolated UE/LE therex at this time. Other Exercises: Unsupported sitting balance edge of bed, max/dep +1-2 for safety--poor control, balance reactions in all planes despite cuing/facilitation. Other Exercises: Assisted with fluid intake (nectar) and oral care--patient lacks UE strength/control for full self-feeding and care.  Hand-over-hand with R UE to assist patient.    General Comments        Pertinent Vitals/Pain Pain Assessment: Faces Pain Score: 0-No pain    Home Living                      Prior Function            PT Goals (current goals can now be found in the care  plan section) Acute Rehab PT Goals PT Goal Formulation: Patient unable to participate in goal setting Time For Goal Achievement: 06/12/15 Potential to Achieve Goals: Fair    Frequency  Min 2X/week    PT Plan      Co-evaluation             End of Session   Activity Tolerance: Patient tolerated treatment well Patient left: in chair;with call bell/phone within reach;with chair alarm set;with  nursing/sitter in room (nursing to reapply hand mits as appropriate)     Time: 1110-1135 PT Time Calculation (min) (ACUTE ONLY): 25 min  Charges:  $Therapeutic Activity: 23-37 mins                    G Codes:      Jakerria Kingbird H. Owens Shark, PT, DPT, NCS 06/05/2015, 12:00 PM [phone]

## 2015-06-05 NOTE — Progress Notes (Signed)
CC: confused, s/p extubation Lethargic but arousable, opens to to vocal stimuli Slight increased with wheezing  Review of Systems  Unable to perform ROS: mental acuity     Filed Vitals:   06/04/15 0631 06/04/15 1941 06/04/15 2120 06/05/15 0555  BP: 156/77  112/76 127/75  Pulse: 120  110 112  Temp: 100.3 F (37.9 C)  97.6 F (36.4 C) 98.6 F (37 C)  TempSrc: Oral  Oral Oral  Resp:   28 24  Height:      Weight: 149 lb 11.2 oz (67.903 kg)   143 lb 9.6 oz (65.137 kg)  SpO2:  93% 90% 91%   Lethargic but arousable  HEENT WNL + wheezes RRR no murmurs Abd soft Ext warm, no edema   BMP Latest Ref Rng 06/05/2015 06/05/2015 06/04/2015  Glucose 65 - 99 mg/dL - 125(H) -  BUN 6 - 20 mg/dL - 47(H) -  Creatinine 0.61 - 1.24 mg/dL - 1.13 -  Sodium 135 - 145 mmol/L 152(H) 151(H) 150(H)  Potassium 3.5 - 5.1 mmol/L - 4.0 -  Chloride 101 - 111 mmol/L - 120(H) -  CO2 22 - 32 mmol/L - 23 -  Calcium 8.9 - 10.3 mg/dL - 8.7(L) -    CBC Latest Ref Rng 06/04/2015 06/01/2015 05/28/2015  WBC 3.8 - 10.6 K/uL 10.1 5.0 6.1  Hemoglobin 13.0 - 18.0 g/dL 13.6 11.6(L) 11.1(L)  Hematocrit 40.0 - 52.0 % 41.1 35.0(L) 32.8(L)  Platelets 150 - 440 K/uL 256 242 206     IMPRESSION: 70 yo white male with ventral hernia repair with bowel ischemia s/p Intubation x 2 now extubated tx floor, slight increased WOB with wheezing with chronic ETOH abuse Heavy smoker clinical signs of  COPD Heavy alcohol abuse Hospital associated delirium  PLAN/REC: Cont supplemental O2 as needed Continue nebulized steroids and bronchodilators Start IV solumedrol 40 BID Continue free water repletion advancement of diet per surgery Increase Quetiapine Cont low dose PRN lorazepam Will follow up   The Patient requires high complexity decision making for assessment and support, frequent evaluation and titration of therapies.  Family are satisfied with Plan of action and management. All questions answered  Corrin Parker, M.D.   [facility]  Medical Director [place] Director [facility] Cardio-Pulmonary Department

## 2015-06-05 NOTE — Progress Notes (Signed)
Patient ID: Brady Smith, male   DOB: 1946-02-01, 70 y.o.   MRN: [id number] [facility] PROGRESS NOTE  Brady Smith [id number] DOB: 1945/07/23 DOA: 05/17/2015 PCP: Halina Maidens, MD  HPI/Subjective: Little more awake. Not taking much by mouth chest x-ray suggestive of pneumonia  Objective: Filed Vitals:   06/04/15 2120 06/05/15 0555  BP: 112/76 127/75  Pulse: 110 112  Temp: 97.6 F (36.4 C) 98.6 F (37 C)  Resp: 28 24    Filed Weights   06/03/15 0500 06/04/15 0631 06/05/15 0555  Weight: 68.8 kg (151 lb 10.8 oz) 67.903 kg (149 lb 11.2 oz) 65.137 kg (143 lb 9.6 oz)    ROS: Review of Systems  Unable to perform ROS  Limited secondary to altered mental status Exam: Physical Exam  Constitutional:Chronically ill-appearing  HENT:  Nose: No mucosal edema.  Mouth/Throat: No oropharyngeal exudate or posterior oropharyngeal edema.  Eyes: Conjunctivae and lids are normal. Pupils are equal, round, and reactive to light.  Neck: No JVD present. Carotid bruit is not present. No edema present. No thyroid mass and no thyromegaly present.  Cardiovascular: S1 normal, S2 normal and normal heart sounds.  Exam reveals no gallop.   No murmur heard. Pulses:      Dorsalis pedis pulses are 0 on the right side, and 1+ on the left side.  Respiratory: No respiratory distress. He has no wheezes. Occasional rhonchi GI: Soft. Bowel sounds are normal. He exhibits no distension. There is no tenderness.  Musculoskeletal:       Right ankle: He exhibits no swelling.       Left ankle: He exhibits no swelling.  Lymphadenopathy:    He has no cervical adenopathy.  Neurological: Awake moving all 4 extremities spontaneous  Skin: Skin is warm. Nails show no clubbing.  Right foot cool to touch. Right fifth toe  brownish blackish discoloration  Psychiatric:   not anxious or agitated     Data Reviewed: Basic Metabolic Panel:  Recent Labs Lab 05/30/15 0400  06/01/15 0453 06/02/15 0623  06/03/15 0513 06/03/15 1856 06/04/15 0352 06/04/15 2111 06/05/15 0229 06/05/15 1002  NA 143  < > 142 147* 150* 147* 148* 150* 151* 152*  K 3.6  < > 3.8 3.8 3.6  --  3.4*  --  4.0  --   CL 106  < > 109 113* 118*  --  115*  --  120*  --   CO2 30  < > 28 28 28   --  24  --  23  --   GLUCOSE 103*  < > 122* 121* 133*  --  133*  --  125*  --   BUN 21*  < > 37* 38* 47*  --  38*  --  47*  --   CREATININE 0.75  < > 0.79 1.03 1.07  --  1.10  --  1.13  --   CALCIUM 8.0*  < > 8.0* 8.3* 8.3*  --  8.4*  --  8.7*  --   MG 2.2  --  2.4 2.6* 2.5*  --  2.0  --  2.5*  --   PHOS 5.3*  --  5.5* 4.6  --   --   --   --   --   --   < > = values in this interval not displayed. CBC:  Recent Labs Lab 06/01/15 0453 06/04/15 0352  WBC 5.0 10.1  HGB 11.6* 13.6  HCT 35.0* 41.1  MCV 95.3 95.3  PLT  242 256    CBG:  Recent Labs Lab 06/04/15 1118 06/04/15 1621 06/04/15 2125 06/05/15 0738 06/05/15 1227  GLUCAP 95 97 100* 116* 232*    Recent Results (from the past 240 hour(s))  CULTURE, BLOOD (ROUTINE X 2) w Reflex to PCR ID Panel     Status: None   Collection Time: 05/27/15  1:26 PM  Result Value Ref Range Status   Specimen Description BLOOD RIGHT HAND  Final   Special Requests BOTTLES DRAWN AEROBIC AND ANAEROBIC 3CC  Final   Culture NO GROWTH 5 DAYS  Final   Report Status 06/01/2015 FINAL  Final  CULTURE, BLOOD (ROUTINE X 2) w Reflex to PCR ID Panel     Status: None   Collection Time: 05/27/15  1:33 PM  Result Value Ref Range Status   Specimen Description BLOOD LEFT HAND  Final   Special Requests BOTTLES DRAWN AEROBIC AND ANAEROBIC 5CC  Final   Culture NO GROWTH 5 DAYS  Final   Report Status 06/01/2015 FINAL  Final  CULTURE, BLOOD (ROUTINE X 2) w Reflex to PCR ID Panel     Status: None (Preliminary result)   Collection Time: 06/04/15  7:58 AM  Result Value Ref Range Status   Specimen Description BLOOD LEFT ARM  Final   Special Requests BOTTLES DRAWN AEROBIC AND ANAEROBIC 6CCAERO,2CCANA   Final   Culture NO GROWTH 1 DAY  Final   Report Status PENDING  Incomplete  CULTURE, BLOOD (ROUTINE X 2) w Reflex to PCR ID Panel     Status: None (Preliminary result)   Collection Time: 06/04/15  7:58 AM  Result Value Ref Range Status   Specimen Description BLOOD RIGHT ARM  Final   Special Requests BOTTLES DRAWN AEROBIC AND ANAEROBIC 5CCAERO,2CCANA  Final   Culture NO GROWTH 1 DAY  Final   Report Status PENDING  Incomplete     Scheduled Meds: . ampicillin-sulbactam (UNASYN) IV  3 g Intravenous Q6H  . antiseptic oral rinse  7 mL Mouth Rinse q12n4p  . budesonide (PULMICORT) nebulizer solution  0.5 mg Nebulization BID  . chlorhexidine  15 mL Mouth Rinse BID  . diltiazem  120 mg Oral Daily  . enoxaparin (LOVENOX) injection  40 mg Subcutaneous Q24H  . insulin aspart  0-5 Units Subcutaneous QHS  . insulin aspart  0-9 Units Subcutaneous TID WC  . ipratropium-albuterol  3 mL Nebulization Q6H  . metoprolol tartrate  25 mg Oral BID  . pantoprazole  40 mg Oral Daily  . QUEtiapine  100 mg Oral BID  . thiamine IV  100 mg Intravenous Daily    Assessment/Plan:  1. Acute encephalopathy - waxing and waning  haloperidol as been discontinued by surgery 2. Fever chest x-ray suggestive of pneumonia we'll start him on Unasyn concern for aspiration 3. Incarcerated ventral hernia. Status post removal of necrotic bowel and incarcerated hernia repair. Continue Megace 4.  Alcohol withdrawal. Should have resolved. Now has acute encephalopathy likely related to hospitalization and other issues try to get patient out of bed 5. Acute respiratory failure with hypoxia. Patient was reintubated 05/24/2015 secondary to encephalopathy. Patient extubated 05/28/2015. Back on nasal cannula oxygen  6. Atrial fibrillation. On Cardizem CD. Hold anticoagulation at this point. Not a good candidate due to fall risk 7. History of COPD on  Pulmicort nebulizers evidence of exasperation 8. Hypernatremia due to free water  deficit  restart D5W 9. Decreased pulse right lower extremity. Vascular surgery consultation appreciated. Likely peripheral vascular disease. Right fifth toe  starting to turn blackish discoloration Weakness- needs physical therapy and rehabilitation Code Status:     Code Status Orders        Start     Ordered   05/18/15 0352  Full code   Continuous     05/18/15 0351    Code Status History    Date Active Date Inactive Code Status Order ID Comments User Context   This patient has a current code status but no historical code status.    Advance Directive Documentation        Most Recent Value   Type of Advance Directive  Healthcare Power of Attorney   Pre-existing out of facility DNR order (yellow form or pink MOST form)     "MOST" Form in Place?       Disposition Plan: Determination on whether the patient can eat on his own prior to discharge out to a rehabilitation   Prognosis of this patient is very poor. Time spent: 20 minutes  Nickolis Diel, [facility]

## 2015-06-05 NOTE — Care Management Important Message (Signed)
Important Message  Patient Details  Name: Brady Smith MRN: [id number] Date of Birth: May 07, 1945   Medicare Important Message Given:  Yes    [name] 06/05/2015, 10:48 AM

## 2015-06-05 NOTE — Progress Notes (Signed)
16 Days Post-Op   Subjective:  70 year old male who is now 16 days post strangulate a ventral hernia repair. Per nursing staff patient has remained intermittently confused requiring frequent reorientation. He will answer simple questions and simply states he wants something to drink. He denies any pain from his abdomen. Patient does state he has soreness to his right lower extremity but is unable to elaborate. Only follow simple commands.  Vital signs in last 24 hours: Temp:  [97.6 F (36.4 C)-98.6 F (37 C)] 98.6 F (37 C) (03/13 0555) Pulse Rate:  [110-112] 112 (03/13 0555) Resp:  [24-28] 24 (03/13 0555) BP: (112-127)/(75-76) 127/75 mmHg (03/13 0555) SpO2:  [90 %-93 %] 91 % (03/13 0555) Weight:  [65.137 kg (143 lb 9.6 oz)] 65.137 kg (143 lb 9.6 oz) (03/13 0555) Last BM Date: 06/04/15  Intake/Output from previous day:   Physical exam: Gen.: Resting in bed in no acute distress but with dry mucous membranes and skin Chest: Coarse breath sounds throughout Heart: Tachycardic GI: Abdomen is soft, nontender, nondistended. Staples are well approximated the midline without any evidence of erythema or drainage. Prior drain sites also without any evidence of erythema or drainage. Extremities: Bilateral lower extremities with dry patchy skin. Right lower extremity without a palpable dorsalis pedis and his fifth digit is starting the mummified turning black.  Lab Results:  CBC  Recent Labs  06/04/15 0352  WBC 10.1  HGB 13.6  HCT 41.1  PLT 256   CMP     Component Value Date/Time   NA 152* 06/05/2015 1002   NA 141 07/28/2013 0444   K 4.0 06/05/2015 0229   K 4.6 07/28/2013 0444   CL 120* 06/05/2015 0229   CL 111* 07/28/2013 0444   CO2 23 06/05/2015 0229   CO2 26 07/28/2013 0444   GLUCOSE 125* 06/05/2015 0229   GLUCOSE 86 07/28/2013 0444   BUN 47* 06/05/2015 0229   BUN 6* 07/28/2013 0444   CREATININE 1.13 06/05/2015 0229   CREATININE 1.13 07/28/2013 0444   CALCIUM 8.7*  06/05/2015 0229   CALCIUM 8.7 07/28/2013 0444   PROT 4.9* 05/23/2015 0614   PROT 7.5 07/27/2013 1041   ALBUMIN 1.8* 05/23/2015 0614   ALBUMIN 3.9 07/27/2013 1041   AST 47* 05/23/2015 0614   AST 17 07/27/2013 1041   ALT 55 05/23/2015 0614   ALT 20 07/27/2013 1041   ALKPHOS 37* 05/23/2015 0614   ALKPHOS 81 07/27/2013 1041   BILITOT 0.9 05/23/2015 0614   BILITOT 0.5 07/27/2013 1041   GFRNONAA >60 06/05/2015 0229   GFRNONAA >60 07/28/2013 0444   GFRNONAA >60 04/13/2011 0325   GFRAA >60 06/05/2015 0229   GFRAA >60 07/28/2013 0444   GFRAA >60 04/13/2011 0325   PT/INR No results for input(s): LABPROT, INR in the last 72 hours.  Studies/Results: Dg Chest 1 View  06/05/2015  CLINICAL DATA:  Shortness of breath. EXAM: CHEST 1 VIEW COMPARISON:  05/28/2015 FINDINGS: The patient has been extubated. Increase in consolidation at the right lung base is suspicious for pneumonia. No overt edema or pleural fluid identified. Stable positioning of PICC line in the SVC. The heart size and mediastinal contours are normal. IMPRESSION: Increased consolidation at the right lung base is suspicious for pneumonia. Electronically Signed   By: Aletta Edouard M.D.   On: 06/05/2015 07:58    Assessment/Plan: 70 year old male now 16 days status post emergent operation for a strangulated ventral hernia that required a bowel resection. Appreciate internal medicine assistance with this patient. Patient  continues to be intermittently confused requiring frequent reorientation. Patient has been sedated numerous times and now appears to have an aspiration pneumonia versus a ventilator associated pneumonia from his prolonged ventilator requirement. Discussed with the nursing staff the importance of getting the patient out of bed. Also discussed the importance of trying to refrain from using sedatives in this patient with an altered mental status. Patient appears very dry today. Plan to restart IV fluids as he has not had any  for 2 days and due to his mental status has not been taking adequate by mouth input.   Clayburn Pert, MD FACS General Surgeon  06/05/2015

## 2015-06-05 NOTE — Progress Notes (Signed)
Pharmacy Antibiotic Note  Brady Smith is a 70 y.o. male admitted on 05/17/2015 with incarcerated ventral hernia.  Pharmacy has been consulted for Unasyn dosing for the treatment of aspiration pneumonia.   Plan:  Will start Unasyn 3 g IV q 6 hours. This patient's current antibiotics will be continued without adjustments.  Height: 5\' 8"  (172.7 cm) Weight: 143 lb 9.6 oz (65.137 kg) IBW/kg (Calculated) : 68.4  Temp (24hrs), Avg:98.1 F (36.7 C), Min:97.6 F (36.4 C), Max:98.6 F (37 C)   Recent Labs Lab 06/01/15 0453 06/02/15 0623 06/03/15 0513 06/04/15 0352 06/05/15 0229  WBC 5.0  --   --  10.1  --   CREATININE 0.79 1.03 1.07 1.10 1.13    Estimated Creatinine Clearance: 56.8 mL/min (by C-G formula based on Cr of 1.13).    No Known Allergies  Antimicrobials this admission: Ertapenem  2/26 >> 3/4 Cipro 3/5 >> 3/8  Dose adjustments this admission:   Microbiology results:  BCx:   UCx:    Sputum (respiratory): Citrobacter (2/27); treated    MRSA PCR:   Thank you for allowing pharmacy to be a part of this patient's care.  Namiko Pritts D 06/05/2015 9:34 AM

## 2015-06-06 LAB — BASIC METABOLIC PANEL
Anion gap: 3 — ABNORMAL LOW (ref 5–15)
BUN: 47 mg/dL — AB (ref 6–20)
CALCIUM: 8.5 mg/dL — AB (ref 8.9–10.3)
CO2: 22 mmol/L (ref 22–32)
CREATININE: 1.2 mg/dL (ref 0.61–1.24)
Chloride: 128 mmol/L — ABNORMAL HIGH (ref 101–111)
GFR calc Af Amer: >60 mL/min (ref >60)
GFR, EST NON AFRICAN AMERICAN: 60 mL/min — AB (ref >60)
GLUCOSE: 158 mg/dL — AB (ref 65–99)
Potassium: 4 mmol/L (ref 3.5–5.1)
Sodium: 153 mmol/L — ABNORMAL HIGH (ref 135–145)

## 2015-06-06 LAB — SODIUM
SODIUM: 156 mmol/L — AB (ref 135–145)
Sodium: 157 mmol/L — ABNORMAL HIGH (ref 135–145)

## 2015-06-06 LAB — GLUCOSE, CAPILLARY
GLUCOSE-CAPILLARY: 180 mg/dL — AB (ref 65–99)
GLUCOSE-CAPILLARY: 136 mg/dL — AB (ref 65–99)
Glucose-Capillary: 135 mg/dL — ABNORMAL HIGH (ref 65–99)
Glucose-Capillary: 190 mg/dL — ABNORMAL HIGH (ref 65–99)

## 2015-06-06 LAB — CBC
HCT: 37.3 % — ABNORMAL LOW (ref 40.0–52.0)
Hemoglobin: 12.3 g/dL — ABNORMAL LOW (ref 13.0–18.0)
MCH: 31.5 pg (ref 26.0–34.0)
MCHC: 32.9 g/dL (ref 32.0–36.0)
MCV: 96 fL (ref 80.0–100.0)
PLATELETS: 223 10*3/uL (ref 150–440)
RBC: 3.89 MIL/uL — ABNORMAL LOW (ref 4.40–5.90)
RDW: 14.1 % (ref 11.5–14.5)
WBC: 7 10*3/uL (ref 3.8–10.6)

## 2015-06-06 MED ORDER — DEXTROSE 5 % IV SOLN
INTRAVENOUS | Status: AC
  Administered 2015-06-06 – 2015-06-07 (×3): via INTRAVENOUS

## 2015-06-06 NOTE — Progress Notes (Signed)
Patient ID: Brady Smith, male   DOB: [birth year], 70 y.o.   MRN: [id number] [facility] PROGRESS NOTE  Brady Smith [id number] DOB: 12/29/1945 DOA: 05/17/2015 PCP: Halina Maidens, MD  HPI/Subjective: Continues to be confused. His sodium is higher he was placed dextrose with lactated ringer. Yesterday. He is currently unable to provide me any review of systems.  Objective: Filed Vitals:   06/05/15 2136 06/06/15 0609  BP: 136/62 119/67  Pulse: 122 103  Temp: 99.2 F (37.3 C) 98.7 F (37.1 C)  Resp: 28 24    Filed Weights   06/04/15 0631 06/05/15 0555 06/06/15 0500  Weight: 67.903 kg (149 lb 11.2 oz) 65.137 kg (143 lb 9.6 oz) 64.864 kg (143 lb)    ROS: Review of Systems  Unable to perform ROS  Limited secondary to altered mental status Exam: Physical Exam  Constitutional:Chronically ill-appearing  HENT:  Nose: No mucosal edema.  Mouth/Throat: No oropharyngeal exudate or posterior oropharyngeal edema.  Eyes: Conjunctivae and lids are normal. Pupils are equal, round, and reactive to light.  Neck: No JVD present. Carotid bruit is not present. No edema present. No thyroid mass and no thyromegaly present.  Cardiovascular: S1 normal, S2 normal and normal heart sounds.  Exam reveals no gallop.   No murmur heard. Pulses:      Dorsalis pedis pulses are 0 on the right side, and 1+ on the left side.  Respiratory: No respiratory distress. He has no wheezes. Occasional rhonchi GI: Soft. Bowel sounds are normal. He exhibits no distension. There is no tenderness.  Musculoskeletal:       Right ankle: He exhibits no swelling.       Left ankle: He exhibits no swelling.  Lymphadenopathy:    He has no cervical adenopathy.  Neurological: Awake moving all 4 extremities spontaneous  Skin: Skin is warm. Nails show no clubbing.  Right foot cool to touch. Right fifth toe  brownish blackish discoloration  Psychiatric:   not anxious or agitated     Data Reviewed: Basic  Metabolic Panel:  Recent Labs Lab 06/01/15 0453 06/02/15 0623 06/03/15 0513  06/04/15 0352  06/05/15 0229 06/05/15 1002 06/05/15 1756 06/06/15 0220 06/06/15 1040  NA 142 147* 150*  < > 148*  < > 151* 152* 154* 153* 157*  K 3.8 3.8 3.6  --  3.4*  --  4.0  --   --  4.0  --   CL 109 113* 118*  --  115*  --  120*  --   --  128*  --   CO2 28 28 28   --  24  --  23  --   --  22  --   GLUCOSE 122* 121* 133*  --  133*  --  125*  --   --  158*  --   BUN 37* 38* 47*  --  38*  --  47*  --   --  47*  --   CREATININE 0.79 1.03 1.07  --  1.10  --  1.13  --   --  1.20  --   CALCIUM 8.0* 8.3* 8.3*  --  8.4*  --  8.7*  --   --  8.5*  --   MG 2.4 2.6* 2.5*  --  2.0  --  2.5*  --   --   --   --   PHOS 5.5* 4.6  --   --   --   --   --   --   --   --   --   < > =  values in this interval not displayed. CBC:  Recent Labs Lab 06/01/15 0453 06/04/15 0352 06/06/15 0220  WBC 5.0 10.1 7.0  HGB 11.6* 13.6 12.3*  HCT 35.0* 41.1 37.3*  MCV 95.3 95.3 96.0  PLT 242 256 223    CBG:  Recent Labs Lab 06/05/15 1227 06/05/15 1630 06/05/15 2138 06/06/15 0731 06/06/15 1128  GLUCAP 232* 151* 188* 180* 135*    Recent Results (from the past 240 hour(s))  CULTURE, BLOOD (ROUTINE X 2) w Reflex to PCR ID Panel     Status: None   Collection Time: 05/27/15  1:26 PM  Result Value Ref Range Status   Specimen Description BLOOD RIGHT HAND  Final   Special Requests BOTTLES DRAWN AEROBIC AND ANAEROBIC 3CC  Final   Culture NO GROWTH 5 DAYS  Final   Report Status 06/01/2015 FINAL  Final  CULTURE, BLOOD (ROUTINE X 2) w Reflex to PCR ID Panel     Status: None   Collection Time: 05/27/15  1:33 PM  Result Value Ref Range Status   Specimen Description BLOOD LEFT HAND  Final   Special Requests BOTTLES DRAWN AEROBIC AND ANAEROBIC 5CC  Final   Culture NO GROWTH 5 DAYS  Final   Report Status 06/01/2015 FINAL  Final  CULTURE, BLOOD (ROUTINE X 2) w Reflex to PCR ID Panel     Status: None (Preliminary result)    Collection Time: 06/04/15  7:58 AM  Result Value Ref Range Status   Specimen Description BLOOD LEFT ARM  Final   Special Requests BOTTLES DRAWN AEROBIC AND ANAEROBIC 6CCAERO,2CCANA  Final   Culture NO GROWTH 2 DAYS  Final   Report Status PENDING  Incomplete  CULTURE, BLOOD (ROUTINE X 2) w Reflex to PCR ID Panel     Status: None (Preliminary result)   Collection Time: 06/04/15  7:58 AM  Result Value Ref Range Status   Specimen Description BLOOD RIGHT ARM  Final   Special Requests BOTTLES DRAWN AEROBIC AND ANAEROBIC 5CCAERO,2CCANA  Final   Culture NO GROWTH 2 DAYS  Final   Report Status PENDING  Incomplete     Scheduled Meds: . ampicillin-sulbactam (UNASYN) IV  3 g Intravenous Q6H  . antiseptic oral rinse  7 mL Mouth Rinse q12n4p  . budesonide (PULMICORT) nebulizer solution  0.5 mg Nebulization BID  . chlorhexidine  15 mL Mouth Rinse BID  . diltiazem  120 mg Oral Daily  . enoxaparin (LOVENOX) injection  40 mg Subcutaneous Q24H  . insulin aspart  0-5 Units Subcutaneous QHS  . insulin aspart  0-9 Units Subcutaneous TID WC  . ipratropium-albuterol  3 mL Nebulization Q4H  . methylPREDNISolone (SOLU-MEDROL) injection  40 mg Intravenous Q12H  . metoprolol tartrate  25 mg Oral BID  . pantoprazole  40 mg Oral Daily  . thiamine IV  100 mg Intravenous Daily    Assessment/Plan:  1. Acute encephalopathy - waxing and waning  haloperidol as needed 2. Fever chest x-ray suggestive of pneumonia back on IV antibiotics for aspiration pneumonia no further fever 3. Incarcerated ventral hernia. Status post removal of necrotic bowel and incarcerated hernia repair. Continue Megace 4.  Alcohol withdrawal. Should have resolved. Now has acute encephalopathy likely related to hospitalization and other issues try to get patient out of bed 5. Acute respiratory failure with hypoxia. Patient was reintubated 05/24/2015 secondary to encephalopathy. Patient extubated 05/28/2015. Back on nasal cannula oxygen   6. Atrial fibrillation. On Cardizem CD.  Not a good candidate due to fall risk 7.  History of COPD on  Pulmicort nebulizers evidence of exasperation 8. Hypernatremia due to free water deficit  Change to D5W 9. Decreased pulse right lower extremity. Vascular surgery consultation appreciated. Likely peripheral vascular disease. Right fifth toe starting to turn blackish discoloration Weakness- needs physical therapy and rehabilitation Code Status:     Code Status Orders        Start     Ordered   05/18/15 0352  Full code   Continuous     05/18/15 0351    Code Status History    Date Active Date Inactive Code Status Order ID Comments User Context   This patient has a current code status but no historical code status.    Advance Directive Documentation        Most Recent Value   Type of Advance Directive  Healthcare Power of Attorney   Pre-existing out of facility DNR order (yellow form or pink MOST form)     "MOST" Form in Place?       Disposition Plan: Determination on whether the patient can eat on his own prior to discharge out to a rehabilitation   Prognosis of this patient is very poor. Time spent: 20 minutes  Kritika Stukes, [place] [facility]

## 2015-06-06 NOTE — Progress Notes (Signed)
17 Days Post-Op   Subjective:  Patient will that more awake this morning and attempted to converse although unintelligible. Per nursing staff he has been able to tolerate more by mouth but is requiring full assistance. Also requiring maximal assist for mobilization out of bed.  Vital signs in last 24 hours: Temp:  [98.7 F (37.1 C)-99.2 F (37.3 C)] 98.7 F (37.1 C) (03/14 0609) Pulse Rate:  [103-122] 103 (03/14 0609) Resp:  [24-28] 24 (03/14 0609) BP: (119-136)/(62-67) 119/67 mmHg (03/14 0609) SpO2:  [85 %-95 %] 95 % (03/14 0609) Weight:  [64.864 kg (143 lb)] 64.864 kg (143 lb) (03/14 0500) Last BM Date: 06/04/15  Intake/Output from previous day: 03/13 0701 - 03/14 0700 In: 2980 [P.O.:45; I.V.:2559.8; IV Piggyback:375.2] Out: 0   Physical exam: General: Resting in bed in no acute distress Chest: Coarse breath sounds throughout Heart: Tachycardic GI: soft, non-tender; bowel sounds normal; no masses,  no organomegaly, staples in place the midline without evidence of erythema or drainage.  Lab Results:  CBC  Recent Labs  06/04/15 0352 06/06/15 0220  WBC 10.1 7.0  HGB 13.6 12.3*  HCT 41.1 37.3*  PLT 256 223   CMP     Component Value Date/Time   NA 157* 06/06/2015 1040   NA 141 07/28/2013 0444   K 4.0 06/06/2015 0220   K 4.6 07/28/2013 0444   CL 128* 06/06/2015 0220   CL 111* 07/28/2013 0444   CO2 22 06/06/2015 0220   CO2 26 07/28/2013 0444   GLUCOSE 158* 06/06/2015 0220   GLUCOSE 86 07/28/2013 0444   BUN 47* 06/06/2015 0220   BUN 6* 07/28/2013 0444   CREATININE 1.20 06/06/2015 0220   CREATININE 1.13 07/28/2013 0444   CALCIUM 8.5* 06/06/2015 0220   CALCIUM 8.7 07/28/2013 0444   PROT 4.9* 05/23/2015 0614   PROT 7.5 07/27/2013 1041   ALBUMIN 1.8* 05/23/2015 0614   ALBUMIN 3.9 07/27/2013 1041   AST 47* 05/23/2015 0614   AST 17 07/27/2013 1041   ALT 55 05/23/2015 0614   ALT 20 07/27/2013 1041   ALKPHOS 37* 05/23/2015 0614   ALKPHOS 81 07/27/2013 1041   BILITOT 0.9 05/23/2015 0614   BILITOT 0.5 07/27/2013 1041   GFRNONAA 60* 06/06/2015 0220   GFRNONAA >60 07/28/2013 0444   GFRNONAA >60 04/13/2011 0325   GFRAA >60 06/06/2015 0220   GFRAA >60 07/28/2013 0444   GFRAA >60 04/13/2011 0325   PT/INR No results for input(s): LABPROT, INR in the last 72 hours.  Studies/Results: Dg Chest 1 View  06/05/2015  CLINICAL DATA:  Shortness of breath. EXAM: CHEST 1 VIEW COMPARISON:  05/28/2015 FINDINGS: The patient has been extubated. Increase in consolidation at the right lung base is suspicious for pneumonia. No overt edema or pleural fluid identified. Stable positioning of PICC line in the SVC. The heart size and mediastinal contours are normal. IMPRESSION: Increased consolidation at the right lung base is suspicious for pneumonia. Electronically Signed   By: Aletta Edouard M.D.   On: 06/05/2015 07:58    Assessment/Plan: 70 year old male 16 days status post motor laparotomy with repair of a strangulated ventral hernia that required a bowel resection and reanastomosed. Very slowly recovering. Continues to have evidence of encephalopathy, peripheral vascular disease, pulmonary disease. Appreciate internal medicine and pulmonary assistance with this patient. Plan to continue limiting any medications that can cause sedation at this time. Should patient truly become combative would then consider a reorder. We'll ask for increasing his pulmonary toilet, encourage physical therapy, encourage  by mouth intake. His overall status remains guarded.   Clayburn Pert, MD FACS General Surgeon  06/06/2015

## 2015-06-06 NOTE — Progress Notes (Signed)
Speech Language Pathology Treatment: Dysphagia  Patient Details Name: Brady Smith MRN: [id number] DOB: 1945-10-15 Today's Date: 06/06/2015 Time: [id number] SLP Time Calculation (min) (ACUTE ONLY): 35 min  Assessment / Plan / Recommendation Clinical Impression     HPI HPI: Pt is a 70 y.o. male w/ h/o kidney and bladder Ca, COPD smoking ~2 packs of cigarettes daily(currenlty), pneumonia, and other medical issues who presents to emergency department for a worsening abdominal hernia. Patient states that after eating a large volume of corn last night, he started having some nausea followed by swelling in his midline and a new area of "bump". He's had a known history of a very large midline hernia and this is his fourth presentation to our group for this identical complaint. Patient states that he was told that the last 2 visits that he should seek his hernia repair but he did not follow up with this advice. Patient currently only complaining of abdominal pain at the site of his hernia. He denies any current nausea, vomiting, chest pain, shortness of breath. He has been passing flatus and his last bowel movement was earlier today albeit small per the patient. Patient admits to smoking 2 packs of cigarettes per day. Patient also states that he drinks one to 2 cans of beer per day as well. During this hospitalization, pt had bowel surgery and remained orally intubated ~1 week d/t respiratory status. He had a bowel resection over a week ago for an incarcerated ventral hernia with necrotic bowel. He had a prolonged postoperative course secondary to alcohol withdrawals and has just come off the ventilator this past weekend. He remains very lethargic and is unable to provide any history and all of the history is obtained from the previous medical record. It was noted that his right foot did not have palpable pedal pulses and was slightly cooler than the left. He does not have any open ulcerations, infection,  gangrenous changes, or nonviable tissue of the right foot that I can see. He is unable to provide any history in terms of whether or not he has claudication or describes any pain. The nurses have used the appropriate padding devices on the right heel to avoid any pressure ulcerations. He does have a palpable pulse in the left foot although it is not prominent. Continues w/ the NG in place along w/ TPN for nutritional support. Pt is drowsy; weaned from any fully sedating meds per MD, however, pt it still requring meds for agitation. Mumbled speech per NSG and stating he is thirsty per report. Mod-max cues still required for follow through w/ tasks of eating/drinking during tx session d/t confusion.  Pt continues to present w/ increased lethargy. NSG stated fair toleration of po's.       SLP Plan  Continue with current plan of care     Recommendations  Diet recommendations: Dysphagia 1 (puree);Nectar-thick liquid Liquids provided via: Teaspoon;Cup;Straw Medication Administration: Crushed with puree Supervision: Full supervision/cueing for compensatory strategies;Trained caregiver to feed patient Compensations: Minimize environmental distractions;Slow rate;Small sips/bites;Follow solids with liquid Postural Changes and/or Swallow Maneuvers: Seated upright 90 degrees;Upright 30-60 min after meal             General recommendations:  (Dietician) Oral Care Recommendations: Oral care BID;Staff/trained caregiver to provide oral care Follow up Recommendations: Skilled Nursing facility Plan: Continue with current plan of care     [place], [place], CCC-SLP  [employer]  06/06/2015, 4:36 PM

## 2015-06-06 NOTE — Progress Notes (Signed)
Nutrition Follow-up     INTERVENTION:  Meals and snacks: Monitor intake and  SLP recommendations Medical nutrition Supplement Therapy: continue supplements for added nutrition    NUTRITION DIAGNOSIS:   Inadequate oral intake related to acute illness, altered GI function as evidenced by NPO status.    GOAL:   Provide needs based on ASPEN/SCCM guidelines    MONITOR:    (PN, Energy Intake, Anthropometrics, Electrolyte/Renal Profile, Glucose Profile)  REASON FOR ASSESSMENT:   Ventilator, Consult New TPN/TNA  ASSESSMENT:      Pt confused, not appropriate to speak with this pm per RN, Maudie Mercury.     Current Nutrition: eating magic cup per RN, Maudie Mercury but nothing else hardly.  Per RN, SLP following and to re-evaluate   Gastrointestinal Profile: Last BM: 3/12   Scheduled Medications:  . ampicillin-sulbactam (UNASYN) IV  3 g Intravenous Q6H  . antiseptic oral rinse  7 mL Mouth Rinse q12n4p  . budesonide (PULMICORT) nebulizer solution  0.5 mg Nebulization BID  . chlorhexidine  15 mL Mouth Rinse BID  . diltiazem  120 mg Oral Daily  . enoxaparin (LOVENOX) injection  40 mg Subcutaneous Q24H  . insulin aspart  0-5 Units Subcutaneous QHS  . insulin aspart  0-9 Units Subcutaneous TID WC  . ipratropium-albuterol  3 mL Nebulization Q4H  . methylPREDNISolone (SOLU-MEDROL) injection  40 mg Intravenous Q12H  . metoprolol tartrate  25 mg Oral BID  . pantoprazole  40 mg Oral Daily  . thiamine IV  100 mg Intravenous Daily    Continuous Medications:  . dextrose 110 mL/hr at 06/06/15 1004     Electrolyte/Renal Profile and Glucose Profile:   Recent Labs Lab 06/01/15 0453 06/02/15 0623 06/03/15 0513  06/04/15 0352  06/05/15 0229  06/05/15 1756 06/06/15 0220 06/06/15 1040  NA 142 147* 150*  < > 148*  < > 151*  < > 154* 153* 157*  K 3.8 3.8 3.6  --  3.4*  --  4.0  --   --  4.0  --   CL 109 113* 118*  --  115*  --  120*  --   --  128*  --   CO2 28 28 28   --  24  --  23  --    --  22  --   BUN 37* 38* 47*  --  38*  --  47*  --   --  47*  --   CREATININE 0.79 1.03 1.07  --  1.10  --  1.13  --   --  1.20  --   CALCIUM 8.0* 8.3* 8.3*  --  8.4*  --  8.7*  --   --  8.5*  --   MG 2.4 2.6* 2.5*  --  2.0  --  2.5*  --   --   --   --   PHOS 5.5* 4.6  --   --   --   --   --   --   --   --   --   GLUCOSE 122* 121* 133*  --  133*  --  125*  --   --  158*  --   < > = values in this interval not displayed.      Weight Trend since Admission: Filed Weights   06/04/15 0631 06/05/15 0555 06/06/15 0500  Weight: 149 lb 11.2 oz (67.903 kg) 143 lb 9.6 oz (65.137 kg) 143 lb (64.864 kg)      Diet Order:  DIET -  DYS 1 Room service appropriate?: Yes; Fluid consistency:: Nectar Thick  Skin:   reviewed  Height:   Ht Readings from Last 1 Encounters:  05/18/15 5\' 8"  (1.727 m)    Weight:   Wt Readings from Last 1 Encounters:  06/06/15 143 lb (64.864 kg)    Ideal Body Weight:     BMI:  Body mass index is 21.75 kg/(m^2).  Estimated Nutritional Needs:   Kcal:  BEE: 1510kcals, TEE: (IF 1.1-1.3)(AF 1.2) 1993-2356kcals  Protein:  93-116g protein (1.2-1.5g/kg)  Fluid:  1925-2310 mL (25-30 ml/kg)   EDUCATION NEEDS:   No education needs identified at this time  HIGH Care Level  Camran Keady B. Zenia Resides, [place], [place] (pager) Weekend/On-Call pager [phone])

## 2015-06-06 NOTE — Plan of Care (Addendum)
Problem: Tissue Perfusion: Goal: Risk factors for ineffective tissue perfusion will decrease Outcome: Not Progressing Poor perfusion to right foot. Right fifth toe black.  Problem: Activity: Goal: Risk for activity intolerance will decrease Outcome: Not Progressing Total care. Max assist.Physical therapy recommends skilled nursing at discharge.  Problem: Nutrition: Goal: Adequate nutrition will be maintained Outcome: Progressing Aspiration risk. Dysphagia diet. Nectar thick liquids. Requires assistance with meals.  Problem: Bowel/Gastric: Goal: Will not experience complications related to bowel motility Outcome: Progressing S/p ventral hernia repair. Mid line staples intact. No s/sx of infection

## 2015-06-07 ENCOUNTER — Inpatient Hospital Stay: Payer: Medicare PPO

## 2015-06-07 LAB — BASIC METABOLIC PANEL
ANION GAP: 4 — AB (ref 5–15)
BUN: 41 mg/dL — ABNORMAL HIGH (ref 6–20)
CALCIUM: 8.5 mg/dL — AB (ref 8.9–10.3)
CO2: 24 mmol/L (ref 22–32)
Chloride: 127 mmol/L — ABNORMAL HIGH (ref 101–111)
Creatinine, Ser: 1.06 mg/dL (ref 0.61–1.24)
GFR calc Af Amer: >60 mL/min (ref >60)
GLUCOSE: 149 mg/dL — AB (ref 65–99)
Potassium: 3.6 mmol/L (ref 3.5–5.1)
SODIUM: 155 mmol/L — AB (ref 135–145)

## 2015-06-07 LAB — CBC
HCT: 36.7 % — ABNORMAL LOW (ref 40.0–52.0)
HEMOGLOBIN: 12.1 g/dL — AB (ref 13.0–18.0)
MCH: 31.9 pg (ref 26.0–34.0)
MCHC: 33.1 g/dL (ref 32.0–36.0)
MCV: 96.4 fL (ref 80.0–100.0)
Platelets: 231 10*3/uL (ref 150–440)
RBC: 3.81 MIL/uL — ABNORMAL LOW (ref 4.40–5.90)
RDW: 13.6 % (ref 11.5–14.5)
WBC: 7.2 10*3/uL (ref 3.8–10.6)

## 2015-06-07 LAB — SODIUM
SODIUM: 155 mmol/L — AB (ref 135–145)
SODIUM: 154 mmol/L — AB (ref 135–145)
SODIUM: 153 mmol/L — AB (ref 135–145)

## 2015-06-07 LAB — GLUCOSE, CAPILLARY
GLUCOSE-CAPILLARY: 151 mg/dL — AB (ref 65–99)
GLUCOSE-CAPILLARY: 209 mg/dL — AB (ref 65–99)
GLUCOSE-CAPILLARY: 167 mg/dL — AB (ref 65–99)
Glucose-Capillary: 129 mg/dL — ABNORMAL HIGH (ref 65–99)
Glucose-Capillary: 169 mg/dL — ABNORMAL HIGH (ref 65–99)

## 2015-06-07 MED ORDER — DEXTROSE 5 % IV SOLN
INTRAVENOUS | Status: AC
  Administered 2015-06-07 – 2015-06-08 (×2): via INTRAVENOUS

## 2015-06-07 NOTE — Care Management Important Message (Signed)
Important Message  Patient Details  Name: Brady Smith MRN: [id number] Date of Birth: 1945/08/26   Medicare Important Message Given:  Yes    Juliann Pulse A Jossie Smoot 06/07/2015, 10:03 AM

## 2015-06-07 NOTE — Progress Notes (Signed)
PT Cancellation Note  Patient Details Name: MATVEI GRASS MRN: [id number] DOB: Mar 09, 1946   Cancelled Treatment:    Reason Eval/Treat Not Completed: Patient at procedure or test/unavailable. Treatment attempted this morn; pt not available. Re attempt later today or tomorrow as schedule allows.   Charlaine Dalton, [place] 06/07/2015, 12:00 PM

## 2015-06-07 NOTE — Progress Notes (Signed)
18 Days Post-Op   Subjective:  Patient much more awake today. Attempting to communicate. Some of his medications are clear however not always understandable. He has been breathing easily and having better swallowing attempts. He does continue to have urinary incontinence.  Vital signs in last 24 hours: Temp:  [97.6 F (36.4 C)-99.7 F (37.6 C)] 97.6 F (36.4 C) (03/15 1313) Pulse Rate:  [91-105] 102 (03/15 1313) Resp:  [18-23] 23 (03/15 1313) BP: (125-133)/(65-74) 125/69 mmHg (03/15 1313) SpO2:  [90 %-96 %] 96 % (03/15 1533) FiO2 (%):  [40 %] 40 % (03/15 1533) Weight:  [68.493 kg (151 lb)] 68.493 kg (151 lb) (03/15 0205) Last BM Date: 06/04/15  Intake/Output from previous day: 03/14 0701 - 03/15 0700 In: 2769.8 [P.O.:170; I.V.:2279.8; IV Piggyback:320] Out: 0   Physical exam: Gen.: No acute distress Chest: Clear to auscultation Heart: Tachycardic GI: Abdomen is soft, nontender, nondistended. Midline incision appears to be healing well with staples still in place without evidence of erythema or drainage.  Extremities: Is moving all extremities. Right lower extremities continues to show evidence of ischemic changes in the distal toes.  Lab Results:  CBC  Recent Labs  06/06/15 0220 06/07/15 0420  WBC 7.0 7.2  HGB 12.3* 12.1*  HCT 37.3* 36.7*  PLT 223 231   CMP     Component Value Date/Time   NA 154* 06/07/2015 1122   NA 141 07/28/2013 0444   K 3.6 06/07/2015 0420   K 4.6 07/28/2013 0444   CL 127* 06/07/2015 0420   CL 111* 07/28/2013 0444   CO2 24 06/07/2015 0420   CO2 26 07/28/2013 0444   GLUCOSE 149* 06/07/2015 0420   GLUCOSE 86 07/28/2013 0444   BUN 41* 06/07/2015 0420   BUN 6* 07/28/2013 0444   CREATININE 1.06 06/07/2015 0420   CREATININE 1.13 07/28/2013 0444   CALCIUM 8.5* 06/07/2015 0420   CALCIUM 8.7 07/28/2013 0444   PROT 4.9* 05/23/2015 0614   PROT 7.5 07/27/2013 1041   ALBUMIN 1.8* 05/23/2015 0614   ALBUMIN 3.9 07/27/2013 1041   AST 47*  05/23/2015 0614   AST 17 07/27/2013 1041   ALT 55 05/23/2015 0614   ALT 20 07/27/2013 1041   ALKPHOS 37* 05/23/2015 0614   ALKPHOS 81 07/27/2013 1041   BILITOT 0.9 05/23/2015 0614   BILITOT 0.5 07/27/2013 1041   GFRNONAA >60 06/07/2015 0420   GFRNONAA >60 07/28/2013 0444   GFRNONAA >60 04/13/2011 0325   GFRAA >60 06/07/2015 0420   GFRAA >60 07/28/2013 0444   GFRAA >60 04/13/2011 0325   PT/INR No results for input(s): LABPROT, INR in the last 72 hours.  Studies/Results: Ct Head Wo Contrast  06/07/2015  CLINICAL DATA:  Altered mental status. EXAM: CT HEAD WITHOUT CONTRAST TECHNIQUE: Contiguous axial images were obtained from the base of the skull through the vertex without intravenous contrast. COMPARISON:  05/30/2015 FINDINGS: Ventricles and cisterns are within normal. There is mild atrophy unchanged. Mild chronic ischemic microvascular disease. No mass, mass effect, shift of midline structures or acute hemorrhage. No evidence to suggest acute infarction. Stable air-fluid level over the sphenoid sinus and minimal mucosal membrane thickening over the posterior left maxillary sinus. Mild opacification of the mastoid air cells unchanged. IMPRESSION: No acute intracranial findings. Atrophy and chronic ischemic microvascular disease. Stable small air-fluid level over the sphenoid sinus. Stable mild opacification of the mastoid air cells. Electronically Signed   By: Marin Olp M.D.   On: 06/07/2015 12:42   Dg Chest [facility]  06/07/2015  CLINICAL DATA:  Pneumonia, followup, history of renal and urinary bladder carcinoma EXAM: PORTABLE CHEST 1 VIEW COMPARISON:  Portable chest x-ray of 06/05/2015 FINDINGS: Aeration of the right lung base has improved although mild airspace disease remains at the right lung base. The left lung is clear. Right PICC line is present with the tip seen to the region of the low SVC. Mild cardiomegaly is stable. IMPRESSION: 1. Improved aeration of the right lung base  although airspace disease remains. 2. Right PICC line tip overlies the lower SVC. Electronically Signed   By: Ivar Drape M.D.   On: 06/07/2015 08:05    Assessment/Plan: 70 year old male 18 days status post export her laparotomy with resection of necrotic small bowel and repair of chronic midline hernia. Is slowly waking up after discontinuing all of his sedating medications. Discussed with the patient and the family the need for him to ambulate and improve his oral intake. As he wakens up anticipate ability to wean off of oxygen and improve his activities of daily living. Encourage incentive spirometer, pulmonary toilet. Appreciate internal medicine and pulmonary assistance with this complex patient. Patient will still likely required skilled nursing facility for continued rehabilitation.   Clayburn Pert, MD FACS General Surgeon  06/07/2015

## 2015-06-07 NOTE — Progress Notes (Signed)
Patient ID: Brady Smith, male   DOB: 11/24/1945, 70 y.o.   MRN: [id number] [facility] PROGRESS NOTE  KEVIAN BURROLA [id number] DOB: February 15, 1946 DOA: 05/17/2015 PCP: Halina Maidens, MD  HPI/Subjective: Patient appears more awake today. He is trying to talk but unable to say what he wants to say.    Objective: Filed Vitals:   06/06/15 2135 06/07/15 0441  BP: 133/74 125/65  Pulse: 105 91  Temp: 99.7 F (37.6 C) 97.8 F (36.6 C)  Resp: 18 20    Filed Weights   06/05/15 0555 06/06/15 0500 06/07/15 0205  Weight: 65.137 kg (143 lb 9.6 oz) 64.864 kg (143 lb) 68.493 kg (151 lb)    ROS: Review of Systems  Unable to perform ROS  Unable to communicate Exam: Physical Exam  Constitutional:Chronically ill-appearing  HENT:  Nose: No mucosal edema.  Mouth/Throat: No oropharyngeal exudate or posterior oropharyngeal edema.  Eyes: Conjunctivae and lids are normal. Pupils are equal, round, and reactive to light.  Neck: No JVD present. Carotid bruit is not present. No edema present. No thyroid mass and no thyromegaly present.  Cardiovascular: S1 normal, S2 normal and normal heart sounds.  Exam reveals no gallop.   No murmur heard. Pulses:      Dorsalis pedis pulses are 0 on the right side, and 1+ on the left side.  Respiratory: No respiratory distress. He has no wheezes. Occasional rhonchi GI: Soft. Bowel sounds are normal. He exhibits no distension. There is no tenderness.  Musculoskeletal:       Right ankle: He exhibits no swelling.       Left ankle: He exhibits no swelling.  Lymphadenopathy:    He has no cervical adenopathy.  Neurological: Awake moving all extremities following commands Skin: Skin is warm. Nails show no clubbing.  Right foot cool to touch. Right fifth toe  brownish blackish discoloration  Psychiatric:   not anxious or agitated     Data Reviewed: Basic Metabolic Panel:  Recent Labs Lab 06/01/15 0453 06/02/15 [id number] 06/03/15 0513   06/04/15 0352  06/05/15 0229  06/06/15 0220 06/06/15 1040 06/06/15 1756 06/07/15 0218 06/07/15 0420  NA 142 147* 150*  < > 148*  < > 151*  < > 153* 157* 156* 155* 155*  K 3.8 3.8 3.6  --  3.4*  --  4.0  --  4.0  --   --   --  3.6  CL 109 113* 118*  --  115*  --  120*  --  128*  --   --   --  127*  CO2 28 28 28   --  24  --  23  --  22  --   --   --  24  GLUCOSE 122* 121* 133*  --  133*  --  125*  --  158*  --   --   --  149*  BUN 37* 38* 47*  --  38*  --  47*  --  47*  --   --   --  41*  CREATININE 0.79 1.03 1.07  --  1.10  --  1.13  --  1.20  --   --   --  1.06  CALCIUM 8.0* 8.3* 8.3*  --  8.4*  --  8.7*  --  8.5*  --   --   --  8.5*  MG 2.4 2.6* 2.5*  --  2.0  --  2.5*  --   --   --   --   --   --  PHOS 5.5* 4.6  --   --   --   --   --   --   --   --   --   --   --   < > = values in this interval not displayed. CBC:  Recent Labs Lab 06/01/15 0453 06/04/15 0352 06/06/15 0220 06/07/15 0420  WBC 5.0 10.1 7.0 7.2  HGB 11.6* 13.6 12.3* 12.1*  HCT 35.0* 41.1 37.3* 36.7*  MCV 95.3 95.3 96.0 96.4  PLT 242 256 223 231    CBG:  Recent Labs Lab 06/06/15 0731 06/06/15 1128 06/06/15 1624 06/06/15 2130 06/07/15 0719  GLUCAP 180* 135* 136* 190* 151*    Recent Results (from the past 240 hour(s))  CULTURE, BLOOD (ROUTINE X 2) w Reflex to PCR ID Panel     Status: None (Preliminary result)   Collection Time: 06/04/15  7:58 AM  Result Value Ref Range Status   Specimen Description BLOOD LEFT ARM  Final   Special Requests BOTTLES DRAWN AEROBIC AND ANAEROBIC 6CCAERO,2CCANA  Final   Culture NO GROWTH 3 DAYS  Final   Report Status PENDING  Incomplete  CULTURE, BLOOD (ROUTINE X 2) w Reflex to PCR ID Panel     Status: None (Preliminary result)   Collection Time: 06/04/15  7:58 AM  Result Value Ref Range Status   Specimen Description BLOOD RIGHT ARM  Final   Special Requests BOTTLES DRAWN AEROBIC AND ANAEROBIC 5CCAERO,2CCANA  Final   Culture NO GROWTH 3 DAYS  Final   Report Status  PENDING  Incomplete     Scheduled Meds: . ampicillin-sulbactam (UNASYN) IV  3 g Intravenous Q6H  . antiseptic oral rinse  7 mL Mouth Rinse q12n4p  . budesonide (PULMICORT) nebulizer solution  0.5 mg Nebulization BID  . chlorhexidine  15 mL Mouth Rinse BID  . diltiazem  120 mg Oral Daily  . enoxaparin (LOVENOX) injection  40 mg Subcutaneous Q24H  . insulin aspart  0-5 Units Subcutaneous QHS  . insulin aspart  0-9 Units Subcutaneous TID WC  . ipratropium-albuterol  3 mL Nebulization Q4H  . methylPREDNISolone (SOLU-MEDROL) injection  40 mg Intravenous Q12H  . metoprolol tartrate  25 mg Oral BID  . pantoprazole  40 mg Oral Daily  . thiamine IV  100 mg Intravenous Daily    Assessment/Plan:  1. Acute encephalopathy - waxing and waning , I will obtain a CT scan of the head due to have patient unable to talk. But he is currently awake 2. Fever on Unasyn for aspiration pneumonia afebrile now continue to monitor hopefully can switch to oral Augmentin in the next day or 2. 3. Incarcerated ventral hernia. Status post removal of necrotic bowel and incarcerated hernia repair. Continue Megace 4.  Alcohol withdrawal. Should have resolved 5. Acute respiratory failure with hypoxia. Currently stable  6. Atrial fibrillation. On Cardizem CD.  Not a good candidate due to fall risk 7. History of COPD on  Pulmicort nebulizers evidence of exasperation 8. Hypernatremia due to free water deficit  increase D5W encouraged fluid intake 9. Decreased pulse right lower extremity. Vascular surgery consultation appreciated. Likely peripheral vascular disease. Right fifth toe starting to turn blackish discoloration Weakness- needs physical therapy and rehabilitation Code Status:     Code Status Orders        Start     Ordered   05/18/15 0352  Full code   Continuous     05/18/15 0351    Code Status History    Date Active  Date Inactive Code Status Order ID Comments User Context   This patient has a current  code status but no historical code status.    Advance Directive Documentation        Most Recent Value   Type of Advance Directive  Healthcare Power of Attorney   Pre-existing out of facility DNR order (yellow form or pink MOST form)     "MOST" Form in Place?       Disposition Plan: Determination on whether the patient can eat on his own prior to discharge out to a rehabilitation   Prognosis of this patient is very poor. Time spent: 20 minutes  Brayten Komar, [facility]

## 2015-06-08 DIAGNOSIS — I714 Abdominal aortic aneurysm, without rupture: Secondary | ICD-10-CM | POA: Diagnosis not present

## 2015-06-08 DIAGNOSIS — I10 Essential (primary) hypertension: Secondary | ICD-10-CM | POA: Diagnosis not present

## 2015-06-08 DIAGNOSIS — G9340 Encephalopathy, unspecified: Secondary | ICD-10-CM | POA: Diagnosis not present

## 2015-06-08 DIAGNOSIS — K450 Other specified abdominal hernia with obstruction, without gangrene: Secondary | ICD-10-CM | POA: Diagnosis not present

## 2015-06-08 DIAGNOSIS — I70261 Atherosclerosis of native arteries of extremities with gangrene, right leg: Secondary | ICD-10-CM | POA: Diagnosis not present

## 2015-06-08 DIAGNOSIS — F10988 Alcohol use, unspecified with other alcohol-induced disorder: Secondary | ICD-10-CM | POA: Diagnosis not present

## 2015-06-08 DIAGNOSIS — R471 Dysarthria and anarthria: Secondary | ICD-10-CM | POA: Diagnosis not present

## 2015-06-08 DIAGNOSIS — M25579 Pain in unspecified ankle and joints of unspecified foot: Secondary | ICD-10-CM | POA: Diagnosis not present

## 2015-06-08 DIAGNOSIS — J690 Pneumonitis due to inhalation of food and vomit: Secondary | ICD-10-CM | POA: Diagnosis not present

## 2015-06-08 DIAGNOSIS — F1014 Alcohol abuse with alcohol-induced mood disorder: Secondary | ICD-10-CM | POA: Diagnosis not present

## 2015-06-08 DIAGNOSIS — R4182 Altered mental status, unspecified: Secondary | ICD-10-CM | POA: Diagnosis not present

## 2015-06-08 DIAGNOSIS — J9601 Acute respiratory failure with hypoxia: Secondary | ICD-10-CM | POA: Diagnosis not present

## 2015-06-08 DIAGNOSIS — E785 Hyperlipidemia, unspecified: Secondary | ICD-10-CM | POA: Diagnosis not present

## 2015-06-08 DIAGNOSIS — J9690 Respiratory failure, unspecified, unspecified whether with hypoxia or hypercapnia: Secondary | ICD-10-CM | POA: Diagnosis not present

## 2015-06-08 DIAGNOSIS — I739 Peripheral vascular disease, unspecified: Secondary | ICD-10-CM | POA: Diagnosis not present

## 2015-06-08 DIAGNOSIS — I96 Gangrene, not elsewhere classified: Secondary | ICD-10-CM | POA: Diagnosis not present

## 2015-06-08 DIAGNOSIS — R1312 Dysphagia, oropharyngeal phase: Secondary | ICD-10-CM | POA: Diagnosis not present

## 2015-06-08 DIAGNOSIS — M6281 Muscle weakness (generalized): Secondary | ICD-10-CM | POA: Diagnosis not present

## 2015-06-08 DIAGNOSIS — F10231 Alcohol dependence with withdrawal delirium: Secondary | ICD-10-CM | POA: Diagnosis not present

## 2015-06-08 DIAGNOSIS — K5669 Other intestinal obstruction: Secondary | ICD-10-CM | POA: Diagnosis not present

## 2015-06-08 DIAGNOSIS — J449 Chronic obstructive pulmonary disease, unspecified: Secondary | ICD-10-CM | POA: Diagnosis not present

## 2015-06-08 DIAGNOSIS — K451 Other specified abdominal hernia with gangrene: Secondary | ICD-10-CM | POA: Diagnosis not present

## 2015-06-08 DIAGNOSIS — R0989 Other specified symptoms and signs involving the circulatory and respiratory systems: Secondary | ICD-10-CM | POA: Diagnosis not present

## 2015-06-08 DIAGNOSIS — I779 Disorder of arteries and arterioles, unspecified: Secondary | ICD-10-CM | POA: Diagnosis not present

## 2015-06-08 DIAGNOSIS — K436 Other and unspecified ventral hernia with obstruction, without gangrene: Secondary | ICD-10-CM | POA: Diagnosis not present

## 2015-06-08 LAB — SODIUM
SODIUM: 148 mmol/L — AB (ref 135–145)
SODIUM: 151 mmol/L — AB (ref 135–145)

## 2015-06-08 LAB — GLUCOSE, CAPILLARY
GLUCOSE-CAPILLARY: 165 mg/dL — AB (ref 65–99)
Glucose-Capillary: 172 mg/dL — ABNORMAL HIGH (ref 65–99)

## 2015-06-08 MED ORDER — AMOXICILLIN-POT CLAVULANATE 875-125 MG PO TABS: 1.0000 | ORAL_TABLET | Freq: Two times a day (BID) | ORAL | Status: DC

## 2015-06-08 MED ORDER — METOPROLOL TARTRATE 25 MG PO TABS
25.0000 mg | ORAL_TABLET | Freq: Two times a day (BID) | ORAL | Status: DC
Start: 2015-06-08 — End: 2015-08-11

## 2015-06-08 MED ORDER — ALBUTEROL SULFATE (2.5 MG/3ML) 0.083% IN NEBU: 2.5000 mg | INHALATION_SOLUTION | RESPIRATORY_TRACT | Status: DC | PRN

## 2015-06-08 MED ORDER — PANTOPRAZOLE SODIUM 40 MG PO TBEC: 40.0000 mg | DELAYED_RELEASE_TABLET | Freq: Every day | ORAL | Status: DC

## 2015-06-08 MED ORDER — IPRATROPIUM-ALBUTEROL 0.5-2.5 (3) MG/3ML IN SOLN
3.0000 mL | Freq: Four times a day (QID) | RESPIRATORY_TRACT | Status: DC
  Administered 2015-06-08: 3 mL via RESPIRATORY_TRACT
  Filled 2015-06-08 (×2): qty 3

## 2015-06-08 MED ORDER — IPRATROPIUM-ALBUTEROL 0.5-2.5 (3) MG/3ML IN SOLN: 3.0000 mL | Freq: Four times a day (QID) | RESPIRATORY_TRACT | Status: DC

## 2015-06-08 MED ORDER — DILTIAZEM HCL ER COATED BEADS 120 MG PO CP24: 120.0000 mg | ORAL_CAPSULE | Freq: Every day | ORAL | Status: DC

## 2015-06-08 NOTE — Progress Notes (Signed)
Patient discharged per MD orders to [facility]. Report called to facility, patient changed into transfer gown. PICC line removed per policy, dressing dry and intact. Belongings removed from hospital safe via security and packed up with patient's belongings for discharge. EMS notified to come and pick up patient.

## 2015-06-08 NOTE — Plan of Care (Signed)
Problem: SLP Dysphagia Goals Goal: Misc Dysphagia Goal Pt will safely tolerate po diet of least restrictive consistency w/ no overt s/s of aspiration noted by Staff/pt/family x3 sessions.    

## 2015-06-08 NOTE — Discharge Summary (Signed)
Patient ID: Brady Smith MRN: [id number] DOB/AGE: 11/12/1945 70 y.o.  Admit date: 05/17/2015 Discharge date: 06/08/2015  Discharge Diagnoses:  Strangulated ventral hernia, delirium tremens, aspiration pneumonia  Procedures Performed: Exploratory laparotomy with small bowel resection and repair of ventral hernia  Discharged Condition: good  Hospital Course: Patient was admitted with an incarcerated ventral hernia and initially treated conservatively. Patient developed signs of bowel ischemia while in inpatient and was taken to the operating room emergently at which point he was discovered to have necrotic small bowel within his ventral hernia. This bowel was resected and reanastomosed as well as his hernia being repaired. Patient had a prolonged intubation postoperatively secondary to alcohol withdrawal and combativeness. Was able to be extubated approximate 7 days postop. Immediate postoperative course was complicated by delirium, oversedation, aspiration. With discontinuation of his sedatives he gradually had a return to a baseline mental status. He required continued antibiotics secondary to his aspiration pneumonia.  Discharge Orders:  discharge to skilled nursing facility  Disposition:  skilled nursing facility  Discharge Medications:    Medication List    TAKE these medications        amoxicillin-clavulanate 875-125 MG tablet  Commonly known as:  AUGMENTIN  Take 1 tablet by mouth every 12 (twelve) hours.     aspirin EC 81 MG tablet  Take 81 mg by mouth every other day.     diltiazem 120 MG 24 hr capsule  Commonly known as:  CARDIZEM CD  Take 1 capsule (120 mg total) by mouth daily.     ipratropium-albuterol 0.5-2.5 (3) MG/3ML Soln  Commonly known as:  DUONEB  Take 3 mLs by nebulization every 6 (six) hours.     metoprolol tartrate 25 MG tablet  Commonly known as:  LOPRESSOR  Take 1 tablet (25 mg total) by mouth 2 (two) times daily.     omeprazole 20 MG capsule   Commonly known as:  PRILOSEC  Take 1 capsule (20 mg total) by mouth daily.     pantoprazole 40 MG tablet  Commonly known as:  PROTONIX  Take 1 tablet (40 mg total) by mouth daily.     VENTOLIN HFA 108 (90 Base) MCG/ACT inhaler  Generic drug:  albuterol  Inhale 2 puffs into the lungs every 6 (six) hours as needed.     albuterol (2.5 MG/3ML) 0.083% nebulizer solution  Commonly known as:  PROVENTIL  Take 3 mLs (2.5 mg total) by nebulization every 3 (three) hours as needed for wheezing or shortness of breath.         Follwup: Follow-up Information    Follow up with DEW,JASON, MD In 4 weeks.   Specialties:  Vascular Surgery, Radiology, Interventional Cardiology   Why:  with ABIs, BLE arterial duplex   Contact information:   [place] [address] [phone]       Follow up with [facility] .   Specialties:  [place], [place]   Contact information:   [address] [place]      Follow up with [facility] [place]. Schedule an appointment as soon as possible for a visit in 1 week.   Specialty:  General Surgery   Why:  post op   Contact information:   [address] [phone]      Signed: Clayburn Pert 06/08/2015, 3:27 PM

## 2015-06-08 NOTE — Progress Notes (Signed)
19 Days Post-Op   Subjective:  Patient much more awake today. Very conversant although obviously confused. Denies any abdominal pain. Allis states that he's radially.  Vital signs in last 24 hours: Temp:  [97.4 F (36.3 C)-98.7 F (37.1 C)] 97.4 F (36.3 C) (03/16 1255) Pulse Rate:  [98-110] 98 (03/16 1255) Resp:  [20] 20 (03/16 1255) BP: (116-128)/(66-73) 118/66 mmHg (03/16 1255) SpO2:  [91 %-98 %] 97 % (03/16 1255) FiO2 (%):  [40 %] 40 % (03/15 1533) Weight:  [68.04 kg (150 lb)-69.355 kg (152 lb 14.4 oz)] 69.355 kg (152 lb 14.4 oz) (03/16 0623) Last BM Date: 06/04/15  Intake/Output from previous day: 03/15 0701 - 03/16 0700 In: 1927 [P.O.:1160; I.V.:667; IV Piggyback:100] Out: -   Physical exam: Gen.: No acute distress but obviously confused Chest: Clear to auscultation  heart: Regular rate and rhythm GI: Abdomen soft, nontender, nondistended. Well-healed midline incision with staples in place.  Lab Results:  CBC  Recent Labs  06/06/15 0220 06/07/15 0420  WBC 7.0 7.2  HGB 12.3* 12.1*  HCT 37.3* 36.7*  PLT 223 231   CMP     Component Value Date/Time   NA 151* 06/08/2015 0950   NA 141 07/28/2013 0444   K 3.6 06/07/2015 0420   K 4.6 07/28/2013 0444   CL 127* 06/07/2015 0420   CL 111* 07/28/2013 0444   CO2 24 06/07/2015 0420   CO2 26 07/28/2013 0444   GLUCOSE 149* 06/07/2015 0420   GLUCOSE 86 07/28/2013 0444   BUN 41* 06/07/2015 0420   BUN 6* 07/28/2013 0444   CREATININE 1.06 06/07/2015 0420   CREATININE 1.13 07/28/2013 0444   CALCIUM 8.5* 06/07/2015 0420   CALCIUM 8.7 07/28/2013 0444   PROT 4.9* 05/23/2015 0614   PROT 7.5 07/27/2013 1041   ALBUMIN 1.8* 05/23/2015 0614   ALBUMIN 3.9 07/27/2013 1041   AST 47* 05/23/2015 0614   AST 17 07/27/2013 1041   ALT 55 05/23/2015 0614   ALT 20 07/27/2013 1041   ALKPHOS 37* 05/23/2015 0614   ALKPHOS 81 07/27/2013 1041   BILITOT 0.9 05/23/2015 0614   BILITOT 0.5 07/27/2013 1041   GFRNONAA >60 06/07/2015 0420   GFRNONAA >60 07/28/2013 0444   GFRNONAA >60 04/13/2011 0325   GFRAA >60 06/07/2015 0420   GFRAA >60 07/28/2013 0444   GFRAA >60 04/13/2011 0325   PT/INR No results for input(s): LABPROT, INR in the last 72 hours.  Studies/Results: Ct Head Wo Contrast  06/07/2015  CLINICAL DATA:  Altered mental status. EXAM: CT HEAD WITHOUT CONTRAST TECHNIQUE: Contiguous axial images were obtained from the base of the skull through the vertex without intravenous contrast. COMPARISON:  05/30/2015 FINDINGS: Ventricles and cisterns are within normal. There is mild atrophy unchanged. Mild chronic ischemic microvascular disease. No mass, mass effect, shift of midline structures or acute hemorrhage. No evidence to suggest acute infarction. Stable air-fluid level over the sphenoid sinus and minimal mucosal membrane thickening over the posterior left maxillary sinus. Mild opacification of the mastoid air cells unchanged. IMPRESSION: No acute intracranial findings. Atrophy and chronic ischemic microvascular disease. Stable small air-fluid level over the sphenoid sinus. Stable mild opacification of the mastoid air cells. Electronically Signed   By: Marin Olp M.D.   On: 06/07/2015 12:42   Dg Chest Port 1 View  06/07/2015  CLINICAL DATA:  Pneumonia, followup, history of renal and urinary bladder carcinoma EXAM: PORTABLE CHEST 1 VIEW COMPARISON:  Portable chest x-ray of 06/05/2015 FINDINGS: Aeration of the right lung base has  improved although mild airspace disease remains at the right lung base. The left lung is clear. Right PICC line is present with the tip seen to the region of the low SVC. Mild cardiomegaly is stable. IMPRESSION: 1. Improved aeration of the right lung base although airspace disease remains. 2. Right PICC line tip overlies the lower SVC. Electronically Signed   By: Ivar Drape M.D.   On: 06/07/2015 08:05    Assessment/Plan: 70 year old male now 19 days status post export her laparotomy with resection of  necrotic small bowel and repair of chronic midline hernia. Has finally awoken but remains confused. Appreciate internal medicine and physical therapy assistance with this patient. Skilled nursing facility has been recommended. We'll attempt to seek placement has patient appears to be ready.   Clayburn Pert, MD FACS General Surgeon  06/08/2015
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
## 2015-06-08 NOTE — Clinical Social Work Note (Signed)
Patient to discharge today to [facility]. [employer] auth still pending but [facility] willing to to accept a 5 day letter of guarantee for patient. Discharge information sent and CSW has spoken to [place] [facility] regarding the fact that patient attempts to get out of bed frequently but cannot walk currently. Patient's sister came to visit and is aware patient is discharging today. CSW has left a message for patient's brother to notify.  Shela Leff MSW,LCSW [phone]
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
## 2015-06-08 NOTE — Discharge Instructions (Signed)
Open Hernia Repair, Care After °Refer to this sheet in the next few weeks. These instructions provide you with information on caring for yourself after your procedure. Your health care provider may also give you more specific instructions. Your treatment has been planned according to current medical practices, but problems sometimes occur. Call your health care provider if you have any problems or questions after your procedure. °WHAT TO EXPECT AFTER THE PROCEDURE °After your procedure, it is typical to have the following: °· Pain in your abdomen, especially along your incision. You will be given pain medicines to control the pain. °· Constipation. You may be given a stool softener to help prevent this. °HOME CARE INSTRUCTIONS °· Only take over-the-counter or prescription medicines as directed by your health care provider. °· Keep the incision area dry and clean. You may wash the incision area gently with soap and water 48 hours after surgery. Gently blot or dab the incision area dry. Do not take baths, use swimming pools, or use hot tubs for 10 days or until your health care provider approves. °· Change bandages (dressings) as directed by your health care provider. °· Continue your normal diet as directed by your health care provider. Eat plenty of fruits and vegetables to help prevent constipation. °· Drink enough fluids to keep your urine clear or pale yellow. This also helps prevent constipation. °· Do not drive until your health care provider says it is okay. °· Do not lift anything heavier than 10 lb (4.5 kg) or play contact sports for 4 weeks or until your health care provider approves. °· Follow up with your health care provider as directed. Ask your health care provider when to make an appointment to have your stitches (sutures) or staples removed. °SEEK MEDICAL CARE IF: °· You have increased bleeding coming from the incision site. °· You have blood in your stool. °· You have increasing pain in the incision  area. °· You see redness or swelling in the incision area. °· You have fluid (pus) coming from the incision. °· You have a fever. °· You notice a bad smell coming from the incision area or dressing. °SEEK IMMEDIATE MEDICAL CARE IF: °· You develop a rash. °· You have chest pain or shortness of breath. °· You feel lightheaded or feel faint. °  °This information is not intended to replace advice given to you by your health care provider. Make sure you discuss any questions you have with your health care provider. °  °Document Released: 09/28/2004 Document Revised: 04/01/2014 Document Reviewed: 10/21/2012 °Elsevier Interactive Patient Education ©2016 [employer] ° °

## 2015-06-08 NOTE — Final Progress Note (Signed)
19 Days Post-Op   Subjective:  Patient did well throughout the day. He was accepted for a skilled nursing facility  Vital signs in last 24 hours: Temp:  [97.4 F (36.3 C)-98.7 F (37.1 C)] 97.4 F (36.3 C) (03/16 1255) Pulse Rate:  [98-110] 98 (03/16 1255) Resp:  [20] 20 (03/16 1255) BP: (116-128)/(66-73) 118/66 mmHg (03/16 1255) SpO2:  [91 %-98 %] 97 % (03/16 1255) FiO2 (%):  [40 %] 40 % (03/15 1533) Weight:  [68.04 kg (150 lb)-69.355 kg (152 lb 14.4 oz)] 69.355 kg (152 lb 14.4 oz) (03/16 0623) Last BM Date: 06/04/15  Intake/Output from previous day: 03/15 0701 - 03/16 0700 In: 1927 [P.O.:1160; I.V.:667; IV Piggyback:100] Out: -   GI: soft, non-tender; bowel sounds normal; no masses,  no organomegaly midline staples in place without evidence of erythema or drainage  Lab Results:  CBC  Recent Labs  06/06/15 0220 06/07/15 0420  WBC 7.0 7.2  HGB 12.3* 12.1*  HCT 37.3* 36.7*  PLT 223 231   CMP     Component Value Date/Time   NA 151* 06/08/2015 0950   NA 141 07/28/2013 0444   K 3.6 06/07/2015 0420   K 4.6 07/28/2013 0444   CL 127* 06/07/2015 0420   CL 111* 07/28/2013 0444   CO2 24 06/07/2015 0420   CO2 26 07/28/2013 0444   GLUCOSE 149* 06/07/2015 0420   GLUCOSE 86 07/28/2013 0444   BUN 41* 06/07/2015 0420   BUN 6* 07/28/2013 0444   CREATININE 1.06 06/07/2015 0420   CREATININE 1.13 07/28/2013 0444   CALCIUM 8.5* 06/07/2015 0420   CALCIUM 8.7 07/28/2013 0444   PROT 4.9* 05/23/2015 0614   PROT 7.5 07/27/2013 1041   ALBUMIN 1.8* 05/23/2015 0614   ALBUMIN 3.9 07/27/2013 1041   AST 47* 05/23/2015 0614   AST 17 07/27/2013 1041   ALT 55 05/23/2015 0614   ALT 20 07/27/2013 1041   ALKPHOS 37* 05/23/2015 0614   ALKPHOS 81 07/27/2013 1041   BILITOT 0.9 05/23/2015 0614   BILITOT 0.5 07/27/2013 1041   GFRNONAA >60 06/07/2015 0420   GFRNONAA >60 07/28/2013 0444   GFRNONAA >60 04/13/2011 0325   GFRAA >60 06/07/2015 0420   GFRAA >60 07/28/2013 0444   GFRAA >60  04/13/2011 0325   PT/INR No results for input(s): LABPROT, INR in the last 72 hours.  Studies/Results: Ct Head Wo Contrast  06/07/2015  CLINICAL DATA:  Altered mental status. EXAM: CT HEAD WITHOUT CONTRAST TECHNIQUE: Contiguous axial images were obtained from the base of the skull through the vertex without intravenous contrast. COMPARISON:  05/30/2015 FINDINGS: Ventricles and cisterns are within normal. There is mild atrophy unchanged. Mild chronic ischemic microvascular disease. No mass, mass effect, shift of midline structures or acute hemorrhage. No evidence to suggest acute infarction. Stable air-fluid level over the sphenoid sinus and minimal mucosal membrane thickening over the posterior left maxillary sinus. Mild opacification of the mastoid air cells unchanged. IMPRESSION: No acute intracranial findings. Atrophy and chronic ischemic microvascular disease. Stable small air-fluid level over the sphenoid sinus. Stable mild opacification of the mastoid air cells. Electronically Signed   By: Marin Olp M.D.   On: 06/07/2015 12:42   Dg Chest Port 1 View  06/07/2015  CLINICAL DATA:  Pneumonia, followup, history of renal and urinary bladder carcinoma EXAM: PORTABLE CHEST 1 VIEW COMPARISON:  Portable chest x-ray of 06/05/2015 FINDINGS: Aeration of the right lung base has improved although mild airspace disease remains at the right lung base. The left lung  is clear. Right PICC line is present with the tip seen to the region of the low SVC. Mild cardiomegaly is stable. IMPRESSION: 1. Improved aeration of the right lung base although airspace disease remains. 2. Right PICC line tip overlies the lower SVC. Electronically Signed   By: Ivar Drape M.D.   On: 06/07/2015 08:05    Assessment/Plan: 70 year old male status post emergent repair of ventral hernia with strangulate his small bowel. Postoperative course complicated by delirium and aspiration pneumonia. Appreciate internal medicine assisted with  this. Patient also has peripheral vascular disease and will need follow-up with vascular surgery for this as well. Plan for transfer to a skilled nursing facility today. Have him return to clinic in 1 week for staple removal and wound check.   Clayburn Pert, MD FACS General Surgeon  06/08/2015

## 2015-06-09 LAB — CULTURE, BLOOD (ROUTINE X 2)
CULTURE: NO GROWTH
Culture: NO GROWTH

## 2015-06-13 DIAGNOSIS — J449 Chronic obstructive pulmonary disease, unspecified: Secondary | ICD-10-CM | POA: Diagnosis not present

## 2015-06-13 DIAGNOSIS — J9690 Respiratory failure, unspecified, unspecified whether with hypoxia or hypercapnia: Secondary | ICD-10-CM | POA: Diagnosis not present

## 2015-06-13 DIAGNOSIS — I10 Essential (primary) hypertension: Secondary | ICD-10-CM | POA: Diagnosis not present

## 2015-06-13 DIAGNOSIS — K451 Other specified abdominal hernia with gangrene: Secondary | ICD-10-CM | POA: Diagnosis not present

## 2015-06-13 DIAGNOSIS — F10988 Alcohol use, unspecified with other alcohol-induced disorder: Secondary | ICD-10-CM | POA: Diagnosis not present

## 2015-06-13 DIAGNOSIS — I739 Peripheral vascular disease, unspecified: Secondary | ICD-10-CM | POA: Diagnosis not present

## 2015-06-13 DIAGNOSIS — G9340 Encephalopathy, unspecified: Secondary | ICD-10-CM | POA: Diagnosis not present

## 2015-06-15 ENCOUNTER — Ambulatory Visit (INDEPENDENT_AMBULATORY_CARE_PROVIDER_SITE_OTHER): Payer: Medicare PPO | Admitting: General Surgery

## 2015-06-15 ENCOUNTER — Encounter: Payer: Self-pay | Admitting: General Surgery

## 2015-06-15 VITALS — BP 111/74 | HR 91 | Temp 98.0°F | Ht 68.0 in

## 2015-06-15 DIAGNOSIS — Z4889 Encounter for other specified surgical aftercare: Secondary | ICD-10-CM

## 2015-06-15 NOTE — Progress Notes (Signed)
Outpatient Surgical Follow Up  06/15/2015  Brady Smith is an 70 y.o. male.   Chief Complaint  Patient presents with  . Routine Post Op    Exploratory Laparoscopy with Small Bowel Resection and repair of Ventral Hernia Repair (05/20/15)- Dr. Adonis Huguenin    HPI: 70 year old male returns to clinic for follow-up from his recent hospital admission during which time he had an exploratory laparotomy with resection of necrotic small bowel and repair of a chronic large incisional hernia. Patient is currently in a skilled nursing facility does had gradual return of his mental facilities. His postoperative course was complicated by delirium. He denies any significant abdominal pain. He is eating well and having bowel function. Denies any fevers, chills, nausea, vomiting.  Past Medical History  Diagnosis Date  . History of kidney cancer   . Bladder cancer ([place])   . Pneumonia     Past Surgical History  Procedure Laterality Date  . Hernia repair    . Cholecystectomy    . Cystectomy w/ continent diversion  1996  . Prostatectomy  1996  . Tonsillectomy    . Colonoscopy  2000  . Laparotomy N/A 05/20/2015    Procedure: EXPLORATORY LAPAROTOMY;  Surgeon: Clayburn Pert, MD;  Location: [facility] ORS;  Service: General;  Laterality: N/A;  . Bowel resection  05/20/2015    Procedure: SMALL BOWEL RESECTION;  Surgeon: Clayburn Pert, MD;  Location: [facility] ORS;  Service: General;;    Family History  Problem Relation Age of Onset  . COPD Sister   . Heart disease Mother   . Heart disease Father     Social History:  reports that he has been smoking Cigarettes.  He has a 114 pack-year smoking history. He has never used smokeless tobacco. He reports that he drinks about 8.4 oz of alcohol per week. He reports that he does not use illicit drugs.  Allergies: No Known Allergies  Medications reviewed.    ROS A multipoint review of systems was completed, all pertinent positives and negatives are documented within  the history of present illness and remainder are negative.   BP 111/74 mmHg  Pulse 91  Temp(Src) 98 F (36.7 C) (Oral)  Ht 5\' 8"  (1.727 m)  Physical Exam Gen.: No acute distress Chest: Coarse breath sounds throughout Heart: Regular rate and rhythm Abdomen: Soft, nontender, nondistended. Well approximated midline incision with staples in place. No evidence of erythema or drainage. Ext: Bilateral lower extremities exam. Right lower chimney with necrotic r digit with evidence of dry gangrene. Decreased pulses on the right.ight fifth    No results found for this or any previous visit (from the past 48 hour(s)). No results found.  Assessment/Plan:  1. Aftercare following surgery 70 year old male 1 month status post repair of incarcerated, strangulated ventral hernia. Doing well. Staples removed without difficulty and replaced with Steri-Strips. Discussed with caregiver the importance of no heavy lifting for another month. Also discussed with patient the importance of him not resuming smoking or alcohol use as this would increase the risk of him having hernia recurrence. Unsure if patient had understanding but he did appear to have improved mentation from hospitalization. He'll be discharged back to the skilled nursing facility and can follow-up with general surgery on an as-needed basis. He has a vascular surgery follow-up for his peripheral vascular disease as he will likely require revascularization and a amputation of his right fifth digit.     Clayburn Pert, MD FACS General Surgeon  06/15/2015,2:38 PM

## 2015-06-15 NOTE — Patient Instructions (Signed)
Please see Progress report sheet

## 2015-06-16 ENCOUNTER — Encounter: Payer: Self-pay | Admitting: Internal Medicine

## 2015-06-16 DIAGNOSIS — I96 Gangrene, not elsewhere classified: Secondary | ICD-10-CM | POA: Insufficient documentation

## 2015-06-28 DIAGNOSIS — I779 Disorder of arteries and arterioles, unspecified: Secondary | ICD-10-CM | POA: Diagnosis not present

## 2015-06-28 DIAGNOSIS — I96 Gangrene, not elsewhere classified: Secondary | ICD-10-CM | POA: Diagnosis not present

## 2015-06-29 ENCOUNTER — Other Ambulatory Visit: Payer: Self-pay | Admitting: Vascular Surgery

## 2015-06-29 DIAGNOSIS — E785 Hyperlipidemia, unspecified: Secondary | ICD-10-CM | POA: Diagnosis not present

## 2015-06-29 DIAGNOSIS — I739 Peripheral vascular disease, unspecified: Secondary | ICD-10-CM | POA: Diagnosis not present

## 2015-06-29 DIAGNOSIS — I70261 Atherosclerosis of native arteries of extremities with gangrene, right leg: Secondary | ICD-10-CM | POA: Diagnosis not present

## 2015-06-29 DIAGNOSIS — I714 Abdominal aortic aneurysm, without rupture: Secondary | ICD-10-CM | POA: Diagnosis not present

## 2015-06-29 DIAGNOSIS — R0989 Other specified symptoms and signs involving the circulatory and respiratory systems: Secondary | ICD-10-CM | POA: Diagnosis not present

## 2015-07-03 ENCOUNTER — Encounter: Payer: Self-pay | Admitting: *Deleted

## 2015-07-03 ENCOUNTER — Other Ambulatory Visit
Admission: RE | Admit: 2015-07-03 | Discharge: 2015-07-03 | Disposition: A | Discharge status: Home / Self Care | Payer: Medicare PPO | Source: Ambulatory Visit | Attending: Vascular Surgery | Admitting: Vascular Surgery

## 2015-07-03 ENCOUNTER — Encounter
Admission: RE | Disposition: A | Discharge status: Home / Self Care | Payer: Self-pay | Source: Ambulatory Visit | Attending: Vascular Surgery

## 2015-07-03 ENCOUNTER — Ambulatory Visit
Admission: RE | Admit: 2015-07-03 | Discharge: 2015-07-03 | Disposition: A | Discharge status: Home / Self Care | Payer: Medicare PPO | Source: Ambulatory Visit | Attending: Vascular Surgery | Admitting: Vascular Surgery

## 2015-07-03 DIAGNOSIS — Z79899 Other long term (current) drug therapy: Secondary | ICD-10-CM | POA: Diagnosis not present

## 2015-07-03 DIAGNOSIS — Z7951 Long term (current) use of inhaled steroids: Secondary | ICD-10-CM | POA: Diagnosis not present

## 2015-07-03 DIAGNOSIS — I70261 Atherosclerosis of native arteries of extremities with gangrene, right leg: Secondary | ICD-10-CM | POA: Diagnosis not present

## 2015-07-03 DIAGNOSIS — I70268 Atherosclerosis of native arteries of extremities with gangrene, other extremity: Secondary | ICD-10-CM | POA: Insufficient documentation

## 2015-07-03 DIAGNOSIS — F17200 Nicotine dependence, unspecified, uncomplicated: Secondary | ICD-10-CM | POA: Diagnosis not present

## 2015-07-03 DIAGNOSIS — Z9889 Other specified postprocedural states: Secondary | ICD-10-CM | POA: Diagnosis not present

## 2015-07-03 DIAGNOSIS — F1010 Alcohol abuse, uncomplicated: Secondary | ICD-10-CM | POA: Insufficient documentation

## 2015-07-03 DIAGNOSIS — I714 Abdominal aortic aneurysm, without rupture: Secondary | ICD-10-CM | POA: Diagnosis not present

## 2015-07-03 DIAGNOSIS — J449 Chronic obstructive pulmonary disease, unspecified: Secondary | ICD-10-CM | POA: Diagnosis not present

## 2015-07-03 DIAGNOSIS — I10 Essential (primary) hypertension: Secondary | ICD-10-CM | POA: Insufficient documentation

## 2015-07-03 DIAGNOSIS — R531 Weakness: Secondary | ICD-10-CM | POA: Insufficient documentation

## 2015-07-03 DIAGNOSIS — R0989 Other specified symptoms and signs involving the circulatory and respiratory systems: Secondary | ICD-10-CM | POA: Diagnosis not present

## 2015-07-03 DIAGNOSIS — Z7982 Long term (current) use of aspirin: Secondary | ICD-10-CM | POA: Insufficient documentation

## 2015-07-03 DIAGNOSIS — E785 Hyperlipidemia, unspecified: Secondary | ICD-10-CM | POA: Diagnosis not present

## 2015-07-03 DIAGNOSIS — M79671 Pain in right foot: Secondary | ICD-10-CM | POA: Insufficient documentation

## 2015-07-03 DIAGNOSIS — I739 Peripheral vascular disease, unspecified: Secondary | ICD-10-CM | POA: Diagnosis not present

## 2015-07-03 HISTORY — DX: Depression, unspecified: F32.A

## 2015-07-03 HISTORY — DX: Anxiety disorder, unspecified: F41.9

## 2015-07-03 HISTORY — DX: Major depressive disorder, single episode, unspecified: F32.9

## 2015-07-03 HISTORY — PX: PERIPHERAL VASCULAR CATHETERIZATION: SHX172C

## 2015-07-03 HISTORY — DX: Gastro-esophageal reflux disease without esophagitis: K21.9

## 2015-07-03 LAB — BUN: BUN: 21 mg/dL — ABNORMAL HIGH (ref 6–20)

## 2015-07-03 LAB — CREATININE, SERUM
Creatinine, Ser: 0.88 mg/dL (ref 0.61–1.24)
GFR calc non Af Amer: >60 mL/min (ref >60)

## 2015-07-03 SURGERY — ABDOMINAL AORTOGRAM W/LOWER EXTREMITY
Laterality: Right | Wound class: Clean

## 2015-07-03 MED ORDER — METHYLPREDNISOLONE SODIUM SUCC 125 MG IJ SOLR: 125.0000 mg | INTRAMUSCULAR | Status: DC | PRN

## 2015-07-03 MED ORDER — MIDAZOLAM HCL 2 MG/2ML IJ SOLN
INTRAMUSCULAR | Status: DC | PRN
  Administered 2015-07-03: 2 mg via INTRAVENOUS
  Administered 2015-07-03: 1 mg via INTRAVENOUS
  Administered 2015-07-03: 2 mg via INTRAVENOUS

## 2015-07-03 MED ORDER — HYDROMORPHONE HCL 1 MG/ML IJ SOLN: 1.0000 mg | Freq: Once | INTRAMUSCULAR | Status: DC

## 2015-07-03 MED ORDER — MIDAZOLAM HCL 2 MG/ML PO SYRP
10.0000 mg | ORAL_SOLUTION | Freq: Once | ORAL | Status: AC
  Administered 2015-07-03: 10 mg via ORAL

## 2015-07-03 MED ORDER — HEPARIN (PORCINE) IN NACL 2-0.9 UNIT/ML-% IJ SOLN
INTRAMUSCULAR | Status: AC
  Filled 2015-07-03: qty 1000

## 2015-07-03 MED ORDER — FENTANYL CITRATE (PF) 100 MCG/2ML IJ SOLN
INTRAMUSCULAR | Status: AC
  Filled 2015-07-03: qty 2

## 2015-07-03 MED ORDER — LIDOCAINE-EPINEPHRINE (PF) 1 %-1:200000 IJ SOLN
INTRAMUSCULAR | Status: AC
  Filled 2015-07-03: qty 30

## 2015-07-03 MED ORDER — FAMOTIDINE 20 MG PO TABS: 40.0000 mg | ORAL_TABLET | ORAL | Status: DC | PRN

## 2015-07-03 MED ORDER — HEPARIN SODIUM (PORCINE) 1000 UNIT/ML IJ SOLN
INTRAMUSCULAR | Status: AC
  Filled 2015-07-03: qty 1

## 2015-07-03 MED ORDER — HEPARIN SODIUM (PORCINE) 1000 UNIT/ML IJ SOLN
INTRAMUSCULAR | Status: DC | PRN
  Administered 2015-07-03: 4000 [IU] via INTRAVENOUS

## 2015-07-03 MED ORDER — MIDAZOLAM HCL 5 MG/5ML IJ SOLN
INTRAMUSCULAR | Status: AC
  Filled 2015-07-03: qty 5

## 2015-07-03 MED ORDER — DEXTROSE 5 % IV SOLN
1.5000 g | INTRAVENOUS | Status: AC
  Administered 2015-07-03: 1.5 g via INTRAVENOUS

## 2015-07-03 MED ORDER — FENTANYL CITRATE (PF) 100 MCG/2ML IJ SOLN
INTRAMUSCULAR | Status: DC | PRN
  Administered 2015-07-03 (×4): 50 ug via INTRAVENOUS

## 2015-07-03 MED ORDER — SODIUM CHLORIDE 0.9 % IV SOLN
INTRAVENOUS | Status: DC
  Administered 2015-07-03: 11:00:00 via INTRAVENOUS

## 2015-07-03 MED ORDER — ONDANSETRON HCL 4 MG/2ML IJ SOLN: 4.0000 mg | Freq: Four times a day (QID) | INTRAMUSCULAR | Status: DC | PRN

## 2015-07-03 MED ORDER — CLOPIDOGREL BISULFATE 75 MG PO TABS: 75.0000 mg | ORAL_TABLET | Freq: Every day | ORAL | Status: DC

## 2015-07-03 SURGICAL SUPPLY — 19 items
BALLN LUTONIX DCB 5X100X130 (BALLOONS) ×4
BALLN LUTONIX DCB 7X60X130 (BALLOONS) ×4
BALLN ULTRVRSE 3X200X130 (BALLOONS) ×4
BALLOON LUTONIX DCB 5X100X130 (BALLOONS) ×3 IMPLANT
BALLOON LUTONIX DCB 7X60X130 (BALLOONS) ×3 IMPLANT
BALLOON ULTRVRSE 3X200X130 (BALLOONS) ×3 IMPLANT
CATH PIG 70CM (CATHETERS) ×4 IMPLANT
CATH VERT 100CM (CATHETERS) ×4 IMPLANT
DEVICE PRESTO INFLATION (MISCELLANEOUS) ×4 IMPLANT
DEVICE STARCLOSE SE CLOSURE (Vascular Products) ×4 IMPLANT
GLIDEWIRE ADV .035X260CM (WIRE) ×4 IMPLANT
PACK ANGIOGRAPHY (CUSTOM PROCEDURE TRAY) ×4 IMPLANT
SHEATH ANL2 6FRX45 HC (SHEATH) ×4 IMPLANT
SHEATH BRITE TIP 5FRX11 (SHEATH) ×4 IMPLANT
STENT LIFESTAR 8X60X80 (Permanent Stent) ×4 IMPLANT
SYR MEDRAD MARK V 150ML (SYRINGE) ×4 IMPLANT
TUBING CONTRAST HIGH PRESS 72 (TUBING) ×4 IMPLANT
WIRE G V18X300CM (WIRE) ×4 IMPLANT
WIRE J 3MM .035X145CM (WIRE) ×4 IMPLANT

## 2015-07-03 NOTE — Op Note (Signed)
[facility] Percutaneous Study/Intervention Procedural Note   Date of Surgery: 07/03/2015  Surgeon(s):Burel Kahre   Assistants:none  Pre-operative Diagnosis: PAD with gangrene right foot  Post-operative diagnosis: Same  Procedure(s) Performed: 1. Ultrasound guidance for vascular access left femoral artery 2. Catheter placement into right anterior tibial artery from left femoral approach 3. Aortogram and selective right lower extremity angiogram 4. Percutaneous transluminal angioplasty of right anterior tibial artery with 3 mm diameter by 20 cm length angioplasty balloon 5. Percutaneous transluminal angioplasty of right superficial femoral artery with 5 mm diameter by 10 cm length Lutonix drug-coated angioplasty balloon  6.  Percutaneous transluminal angioplasty of the right external iliac artery with 7 mm diameter by 6 cm length Lutonix drug-coated angioplasty balloon  7.  Self-expanding stent placement to the right external iliac artery for greater than 50% residual stenosis after angioplasty using an 8 mm diameter by 6 cm length stent 8. StarClose closure device left femoral artery  EBL: Minimal  Contrast: 70 cc  Fluoro Time: 5 minutes  Moderate Conscious Sedation Time: approximately 40 minutes using 5 mg of Versed and 200 mcg of Fentanyl  Indications: Patient is a 70 y.o.male with gangrene in the right foot. The patient is brought in for angiography for further evaluation and potential treatment. Risks and benefits are discussed and informed consent is obtained  Procedure: The patient was identified and appropriate procedural time out was performed. The patient was then placed supine on the table and prepped and draped in the usual sterile fashion.Moderate conscious sedation was administered during a face to face encounter with the patient throughout the  procedure with my supervision of the RN administering medicines and monitoring the patient's vital signs, pulse oximetry, telemetry and mental status throughout from the start of the procedure until the patient was taken to the recovery room. Ultrasound was used to evaluate the left common femoral artery. It was patent . A digital ultrasound image was acquired. A Seldinger needle was used to access the left common femoral artery under direct ultrasound guidance and a permanent image was performed. A 0.035 J wire was advanced without resistance and a 5Fr sheath was placed. Pigtail catheter was placed into the aorta and an AP aortogram was performed. This demonstrated normal renal arteries, no stenosis in the aorta although somewhat irregular and ectatic, right external iliac artery 85-90% stenosis, no left iliac stenosis . I then crossed the aortic bifurcation and advanced to the right femoral head. Selective right lower extremity angiogram was then performed. This demonstrated about a 60% stenosis in the mid to distal right superficial femoral artery, a peroneal artery that was continuous but terminated at the ankle, and an anterior tibial artery with a medium length occlusion in its proximal to mid segment but reconstituted distally and provided flow to the foot. The profunda femoris artery was also quite pruned. The patient was systemically heparinized and a 6 [place] Ansell sheath was then placed over the [employer] wire. I then used a Kumpe catheter and the advantage wire to navigate through the SFA stenosis and into the anterior tibial artery. I then cross the anterior tibial occlusion and confirm intraluminal flow in the distal anterior tibial artery. The advantage wire was then replaced. A 3 mm diameter by 20 cm length angioplasty balloon was inflated to 10 atm for 1 minute the proximal to mid anterior tibial artery. Completion angiogram showed some spasm and the vessel but it was now patent and  continuous to the foot. The degree  of stenosis was difficult to discern both due to patient motion and vasospasm. I then turned my attention to the right superficial femoral artery. A 5 mm diameter by 10 cm length Lutonix drug-coated angioplasty balloon was inflated to 12 atm for 1 minute and this lesion. Completion angiogram following this showed about a 30% residual stenosis which was not flow limiting. I then turned my attention to the external iliac artery on the right. This high-grade stenosis was significantly limiting the inflow. A 7 mm diameter by 6 cm length Lutonix drug-coated angioplasty balloon was inflated to 10 atm for 1 minute. Completion angiogram showed a greater than 50% residual stenosis. I elected to place an 8 mm diameter by 6 cm length life [name] stent and postdilated this with a 7 mm balloon with a less than 20% residual stenosis identified after stent placement. I elected to terminate the procedure. The sheath was removed and StarClose closure device was deployed in the left femoral artery with excellent hemostatic result. The patient was taken to the recovery room in stable condition having tolerated the procedure well.  Findings:  Aortogram: normal renal arteries, no stenosis in the aorta although somewhat irregular and ectatic, right external iliac artery 85-90% stenosis, no left iliac stenosis Right Lower Extremity: 60% stenosis in the mid to distal right superficial femoral artery, a peroneal artery that was continuous but terminated at the ankle, and an anterior tibial artery with a medium length occlusion in its proximal to mid segment but reconstituted distally and provided flow to the foot. The profunda femoris artery was also quite pruned.   Disposition: Patient was taken to the recovery room in stable condition having tolerated the procedure well.  Complications: None  Brady Smith 07/03/2015 1:55 PM

## 2015-07-03 NOTE — H&P (Signed)
  [facility] History & Physical Update  The patient was interviewed and re-examined.  The patient's previous History and Physical has been reviewed and is unchanged.  There is no change in the plan of care. We plan to proceed with the scheduled procedure.  Teigen Parslow, MD  07/03/2015, 9:20 AM

## 2015-07-04 ENCOUNTER — Encounter: Payer: Self-pay | Admitting: Vascular Surgery

## 2015-07-13 ENCOUNTER — Other Ambulatory Visit: Payer: Self-pay | Admitting: Internal Medicine

## 2015-07-14 ENCOUNTER — Encounter: Payer: Self-pay | Admitting: Internal Medicine

## 2015-07-14 ENCOUNTER — Ambulatory Visit (INDEPENDENT_AMBULATORY_CARE_PROVIDER_SITE_OTHER): Payer: Medicare PPO | Admitting: Internal Medicine

## 2015-07-14 VITALS — BP 124/76 | Ht 68.0 in | Wt 148.2 lb

## 2015-07-14 DIAGNOSIS — I96 Gangrene, not elsewhere classified: Secondary | ICD-10-CM

## 2015-07-14 DIAGNOSIS — G9340 Encephalopathy, unspecified: Secondary | ICD-10-CM

## 2015-07-14 DIAGNOSIS — I739 Peripheral vascular disease, unspecified: Secondary | ICD-10-CM | POA: Diagnosis not present

## 2015-07-14 DIAGNOSIS — J449 Chronic obstructive pulmonary disease, unspecified: Secondary | ICD-10-CM

## 2015-07-14 DIAGNOSIS — I10 Essential (primary) hypertension: Secondary | ICD-10-CM

## 2015-07-14 DIAGNOSIS — F17200 Nicotine dependence, unspecified, uncomplicated: Secondary | ICD-10-CM | POA: Diagnosis not present

## 2015-07-14 MED ORDER — PANTOPRAZOLE SODIUM 40 MG PO TBEC: 40.0000 mg | DELAYED_RELEASE_TABLET | Freq: Every day | ORAL | Status: DC

## 2015-07-14 MED ORDER — CLOPIDOGREL BISULFATE 75 MG PO TABS: 75.0000 mg | ORAL_TABLET | Freq: Every day | ORAL | Status: DC

## 2015-07-14 MED ORDER — HYDROCODONE-ACETAMINOPHEN 5-325 MG PO TABS: 1.0000 | ORAL_TABLET | Freq: Four times a day (QID) | ORAL | Status: DC | PRN

## 2015-07-14 NOTE — Progress Notes (Signed)
Date:  07/14/2015   Name:  Brady Smith   DOB:  Jul 05, 1945   MRN:  [id number]   Chief Complaint: Follow-up and Foot Pain Patient was recently hospitalized with ischemic small bowel due to a ventral hernia. This was repaired and resected. He was in the ICU intubated for about one week postop but finally recovered and was able to be discharged to a nursing home.  He is doing well with normal bowel habits, well healed scar and minimal pain.  While in the ICU he had delerium and was prescribed Haldol.  He is also on Ativan and Ambien.  He was also discharged with Vicodin and Tramadol for pain.  He is now out of the nursing home and living at a boarding house. While in the hospital they noticed gangrene of his right foot. 2 weeks ago underwent an outpatient revascularization procedure that was apparently modestly successful.  He reports that the toes are now pink rather than purple or black.  His eye site is poor and he can not see his toes well. He thinks that the 5th toe is turning dark.  He is doing his own dressing change every three days or so.  He has not been showering since he was not told he could.   Review of Systems  Constitutional: Negative for fever, chills and fatigue.  Respiratory: Positive for cough. Negative for chest tightness and shortness of breath.   Cardiovascular: Negative for chest pain, palpitations and leg swelling.  Gastrointestinal: Negative for abdominal pain, constipation and blood in stool.  Skin: Positive for color change and wound. Negative for rash.  Neurological: Negative for dizziness, numbness and headaches.  Psychiatric/Behavioral: Positive for sleep disturbance. Negative for hallucinations and dysphoric mood.    Patient Active Problem List   Diagnosis Date Noted  . Gangrene of toe ([place]) 06/16/2015  . Acute respiratory failure ([place]) 05/23/2015  . ETOH abuse 05/23/2015  . Acute encephalopathy 05/23/2015  . Small bowel obstruction ([place]) 05/18/2015  .  Incarcerated ventral hernia 05/18/2015  . Essential hypertension, malignant 05/18/2015  . Urinary retention 05/18/2015  . UTI (urinary tract infection) 05/18/2015  . Choroidal nevus, right eye 02/08/2015  . Carpal tunnel syndrome 01/13/2015  . H/O gastrointestinal disease 01/13/2015  . Phobia 01/13/2015  . History of primary malignant neoplasm of urinary bladder 01/13/2015  . Compulsive tobacco user syndrome 01/13/2015  . Familial multiple lipoprotein-type hyperlipidemia 01/13/2015  . COPD (chronic obstructive pulmonary disease) ([place]) 06/11/2011  . Smoker 06/11/2011  . Pulmonary nodule 06/11/2011    Prior to Admission medications   Medication Sig Start Date End Date Taking? Authorizing Provider  aspirin EC 81 MG tablet Take 81 mg by mouth every other day.   Yes Historical Provider, MD  clopidogrel (PLAVIX) 75 MG tablet Take 1 tablet (75 mg total) by mouth daily. 07/03/15  Yes Algernon Huxley, MD  haloperidol (HALDOL) 1 MG tablet Take 1 mg by mouth every 6 (six) hours as needed for agitation.   Yes Historical Provider, MD  HYDROcodone-acetaminophen (NORCO/VICODIN) 5-325 MG tablet Take 1 tablet by mouth every 6 (six) hours as needed for moderate pain.   Yes Historical Provider, MD  LORazepam (ATIVAN) 0.5 MG tablet Take 0.5 mg by mouth every 6 (six) hours as needed for anxiety.   Yes Historical Provider, MD  pantoprazole (PROTONIX) 40 MG tablet Take 1 tablet by mouth daily. 06/08/15  Yes Historical Provider, MD  traMADol (ULTRAM) 50 MG tablet Take by mouth every 6 (six) hours  as needed.   Yes Historical Provider, MD  zolpidem (AMBIEN) 10 MG tablet Take 10 mg by mouth at bedtime as needed for sleep.   Yes Historical Provider, MD  albuterol (PROVENTIL) (2.5 MG/3ML) 0.083% nebulizer solution Take 3 mLs (2.5 mg total) by nebulization every 3 (three) hours as needed for wheezing or shortness of breath. Patient not taking: Reported on 07/14/2015 06/08/15   Clayburn Pert, MD  albuterol (VENTOLIN HFA) 108  (90 BASE) MCG/ACT inhaler Inhale 2 puffs into the lungs every 6 (six) hours as needed. Reported on 07/14/2015    Historical Provider, MD  diltiazem (CARDIZEM CD) 120 MG 24 hr capsule Take 1 capsule (120 mg total) by mouth daily. Patient not taking: Reported on 07/14/2015 06/08/15   Clayburn Pert, MD  ipratropium-albuterol (DUONEB) 0.5-2.5 (3) MG/3ML SOLN Take 3 mLs by nebulization every 6 (six) hours. Patient not taking: Reported on 07/14/2015 06/08/15   Clayburn Pert, MD  metoprolol tartrate (LOPRESSOR) 25 MG tablet Take 1 tablet (25 mg total) by mouth 2 (two) times daily. Patient not taking: Reported on 07/14/2015 06/08/15   Clayburn Pert, MD    No Known Allergies  Past Surgical History  Procedure Laterality Date  . Hernia repair    . Cholecystectomy    . Cystectomy w/ continent diversion  1996  . Prostatectomy  1996  . Tonsillectomy    . Colonoscopy  2000  . Laparotomy N/A 05/20/2015    Procedure: EXPLORATORY LAPAROTOMY;  Surgeon: Clayburn Pert, MD;  Location: [facility] ORS;  Service: General;  Laterality: N/A;  . Bowel resection  05/20/2015    Procedure: SMALL BOWEL RESECTION;  Surgeon: Clayburn Pert, MD;  Location: [facility] ORS;  Service: General;;  . Peripheral vascular catheterization N/A 07/03/2015    Procedure: Abdominal Aortogram w/Lower Extremity;  Surgeon: Algernon Huxley, MD;  Location: [facility];  Service: Cardiovascular;  Laterality: N/A;  . Peripheral vascular catheterization  07/03/2015    Procedure: Lower Extremity Intervention;  Surgeon: Algernon Huxley, MD;  Location: [facility];  Service: Cardiovascular;;    Social History  Substance Use Topics  . Smoking status: Current Every Day Smoker -- 2.00 packs/day for 57 years    Types: Cigarettes  . Smokeless tobacco: Never Used     Comment: patient not ready to quit  . Alcohol Use: 9.6 oz/week    14 Standard drinks or equivalent, 2 Cans of beer per week     Medication list has been reviewed and  updated.   Physical Exam  Constitutional: He is oriented to person, place, and time. He appears well-developed. No distress.  HENT:  Head: Normocephalic and atraumatic.  Neck: Normal range of motion. No thyromegaly present.  Cardiovascular: Normal rate, regular rhythm and normal heart sounds.   Pulmonary/Chest: Effort normal. No respiratory distress. He has no wheezes.  Abdominal: Soft. Bowel sounds are normal. He exhibits no mass (surgical site well healed).  Musculoskeletal: Normal range of motion. He exhibits no edema.  Lymphadenopathy:    He has no cervical adenopathy.  Neurological: He is alert and oriented to person, place, and time.  Skin: Skin is warm.  Necrotic distal right 5th toe with serous drainage and foul odor.  Nail intact.  Other digits pink and warm.  Minimal edema noted  Psychiatric: He has a normal mood and affect. His behavior is normal. Thought content normal.    BP 124/76 mmHg  Ht 5\' 8"  (1.727 m)  Wt 148 lb 3.2 oz (67.223 kg)  BMI  22.54 kg/m2  Assessment and Plan: 1. Essential hypertension Now off medication - will continue to monitor  2. Gangrene of toe ([place]) Note sent to Dr. Lucky Cowboy to schedule follow up Rx for Vicodin given  3. Compulsive tobacco user syndrome Patient is strongly encouraged to quit Not currently using any inhaled medication  4. Peripheral vascular disease ([place]) S/p re-vascularization with improvement Continue Plavix and Aspirin  5. Chronic obstructive pulmonary disease, unspecified COPD type ([place]) Stable; no longer using inhalers other than MDI as needed  6. Acute encephalopathy Will DC Haldol and Ativan May continue Ambien PRN for sleep but insurance will likely not cover this  Halina Maidens, MD [facility]  07/14/2015

## 2015-07-18 ENCOUNTER — Encounter
Admission: RE | Admit: 2015-07-18 | Discharge: 2015-07-18 | Disposition: A | Discharge status: Home / Self Care | Payer: Medicare PPO | Source: Ambulatory Visit | Attending: Vascular Surgery | Admitting: Vascular Surgery

## 2015-07-18 ENCOUNTER — Other Ambulatory Visit: Payer: Self-pay | Admitting: Vascular Surgery

## 2015-07-18 DIAGNOSIS — L97511 Non-pressure chronic ulcer of other part of right foot limited to breakdown of skin: Secondary | ICD-10-CM | POA: Diagnosis not present

## 2015-07-18 DIAGNOSIS — I739 Peripheral vascular disease, unspecified: Secondary | ICD-10-CM | POA: Diagnosis not present

## 2015-07-18 DIAGNOSIS — I10 Essential (primary) hypertension: Secondary | ICD-10-CM | POA: Diagnosis not present

## 2015-07-18 DIAGNOSIS — F17200 Nicotine dependence, unspecified, uncomplicated: Secondary | ICD-10-CM | POA: Diagnosis not present

## 2015-07-18 DIAGNOSIS — E785 Hyperlipidemia, unspecified: Secondary | ICD-10-CM | POA: Diagnosis not present

## 2015-07-18 DIAGNOSIS — I714 Abdominal aortic aneurysm, without rupture: Secondary | ICD-10-CM | POA: Diagnosis not present

## 2015-07-18 DIAGNOSIS — J449 Chronic obstructive pulmonary disease, unspecified: Secondary | ICD-10-CM | POA: Diagnosis not present

## 2015-07-18 DIAGNOSIS — I96 Gangrene, not elsewhere classified: Secondary | ICD-10-CM | POA: Diagnosis present

## 2015-07-18 DIAGNOSIS — R0989 Other specified symptoms and signs involving the circulatory and respiratory systems: Secondary | ICD-10-CM | POA: Diagnosis not present

## 2015-07-18 DIAGNOSIS — M869 Osteomyelitis, unspecified: Secondary | ICD-10-CM | POA: Diagnosis not present

## 2015-07-18 DIAGNOSIS — I70235 Atherosclerosis of native arteries of right leg with ulceration of other part of foot: Secondary | ICD-10-CM | POA: Diagnosis not present

## 2015-07-18 DIAGNOSIS — Z7982 Long term (current) use of aspirin: Secondary | ICD-10-CM | POA: Diagnosis not present

## 2015-07-18 DIAGNOSIS — K219 Gastro-esophageal reflux disease without esophagitis: Secondary | ICD-10-CM | POA: Diagnosis not present

## 2015-07-18 DIAGNOSIS — F1010 Alcohol abuse, uncomplicated: Secondary | ICD-10-CM | POA: Diagnosis not present

## 2015-07-18 DIAGNOSIS — Z79899 Other long term (current) drug therapy: Secondary | ICD-10-CM | POA: Diagnosis not present

## 2015-07-18 DIAGNOSIS — I70261 Atherosclerosis of native arteries of extremities with gangrene, right leg: Secondary | ICD-10-CM | POA: Diagnosis not present

## 2015-07-18 DIAGNOSIS — Z7951 Long term (current) use of inhaled steroids: Secondary | ICD-10-CM | POA: Diagnosis not present

## 2015-07-18 HISTORY — DX: Peripheral vascular disease, unspecified: I73.9

## 2015-07-18 HISTORY — DX: Chronic obstructive pulmonary disease, unspecified: J44.9

## 2015-07-18 LAB — BASIC METABOLIC PANEL
Anion gap: 8 (ref 5–15)
BUN: 10 mg/dL (ref 6–20)
CHLORIDE: 106 mmol/L (ref 101–111)
CO2: 24 mmol/L (ref 22–32)
Calcium: 9.5 mg/dL (ref 8.9–10.3)
Creatinine, Ser: 0.86 mg/dL (ref 0.61–1.24)
GFR calc Af Amer: >60 mL/min (ref >60)
GFR calc non Af Amer: >60 mL/min (ref >60)
GLUCOSE: 78 mg/dL (ref 65–99)
POTASSIUM: 3.6 mmol/L (ref 3.5–5.1)
Sodium: 138 mmol/L (ref 135–145)

## 2015-07-18 LAB — PROTIME-INR
INR: 1.03
Prothrombin Time: 13.7 seconds (ref 11.4–15.0)

## 2015-07-18 LAB — CBC WITH DIFFERENTIAL/PLATELET
Basophils Absolute: 0 10*3/uL (ref 0–0.1)
Basophils Relative: 1 %
EOS PCT: 1 %
Eosinophils Absolute: 0.1 10*3/uL (ref 0–0.7)
HCT: 40.3 % (ref 40.0–52.0)
Hemoglobin: 13.5 g/dL (ref 13.0–18.0)
LYMPHS ABS: 1.6 10*3/uL (ref 1.0–3.6)
Lymphocytes Relative: 19 %
MCH: 31.1 pg (ref 26.0–34.0)
MCHC: 33.6 g/dL (ref 32.0–36.0)
MCV: 92.4 fL (ref 80.0–100.0)
MONOS PCT: 6 %
Monocytes Absolute: 0.5 10*3/uL (ref 0.2–1.0)
Neutro Abs: 6.1 10*3/uL (ref 1.4–6.5)
Neutrophils Relative %: 73 %
PLATELETS: 287 10*3/uL (ref 150–440)
RBC: 4.36 MIL/uL — AB (ref 4.40–5.90)
RDW: 16.6 % — ABNORMAL HIGH (ref 11.5–14.5)
WBC: 8.3 10*3/uL (ref 3.8–10.6)

## 2015-07-18 LAB — TYPE AND SCREEN
ABO/RH(D): O POS
ANTIBODY SCREEN: NEGATIVE
Extend sample reason: UNDETERMINED

## 2015-07-18 LAB — APTT: aPTT: 31 seconds (ref 24–36)

## 2015-07-18 NOTE — Patient Instructions (Addendum)
  Your procedure is scheduled on: 07/19/15 Report to Day Surgery at 9:00   Remember: Instructions that are not followed completely may result in serious medical risk, up to and including death, or upon the discretion of your surgeon and anesthesiologist your surgery may need to be rescheduled.    ___x_ 1. Do not eat food or drink liquids after midnight. No gum chewing or hard candies.     ___x_ 2. No Alcohol for 24 hours before or after surgery.   __x__ 3. Bring all medications with you on the day of surgery if instructed.    ___x_ 4. Notify your doctor if there is any change in your medical condition     (cold, fever, infections).     Do not wear jewelry, make-up, hairpins, clips or nail polish.  Do not wear lotions, powders, or perfumes. You may wear deodorant.  Do not shave 48 hours prior to surgery. Men may shave face and neck.  Do not bring valuables to the hospital.    [facility] is not responsible for any belongings or valuables.               Contacts, dentures or bridgework may not be worn into surgery.  Leave your suitcase in the car. After surgery it may be brought to your room.  For patients admitted to the hospital, discharge time is determined by your                treatment team.   Patients discharged the day of surgery will not be allowed to drive home.   Please read over the following fact sheets that you were given:   Surgical Site Infection Prevention   ____ Take these medicines the morning of surgery with A SIP OF WATER:    1. Diltiazem ( Cardiazem)   2. Metoprolol Tartrate (lopressor)  3. Pantoprazole (protonix)  4. Pain Medication if needed  5.  6.  ____ Fleet Enema (as directed)   ___x_ Use CHG Soap as directed  __x__ Use inhalers on the day of surgery  ____ Stop metformin 2 days prior to surgery    ____ Take 1/2 of usual insulin dose the night before surgery and none on the morning of surgery.   ____ Stop Coumadin/Plavix/aspirin on   ____  Stop Anti-inflammatories on    ____ Stop supplements until after surgery.    ____ Bring C-Pap to the hospital.

## 2015-07-18 NOTE — Pre-Procedure Instructions (Signed)
Patient Name Sex DOB SSN    Brady Smith, Brady Smith Male April 10, 1945 [id number]    Progress Notes by Flora Lipps, MD at 06/05/2015 4:28 PM    Author: Flora Lipps, MD Service: Pulmonology Author Type: Physician   Filed: 06/05/2015 4:38 PM Note Time: 06/05/2015 4:28 PM Status: Signed   Editor: Flora Lipps, MD (Physician)     Expand All Collapse All   CC: confused, s/p extubation Lethargic but arousable, opens to to vocal stimuli Slight increased with wheezing  Review of Systems  Unable to perform ROS: mental acuity     Filed Vitals:   06/04/15 0631 06/04/15 1941 06/04/15 2120 06/05/15 0555  BP: 156/77  112/76 127/75  Pulse: 120  110 112  Temp: 100.3 F (37.9 C)  97.6 F (36.4 C) 98.6 F (37 C)  TempSrc: Oral  Oral Oral  Resp:   28 24  Height:      Weight: 149 lb 11.2 oz (67.903 kg)   143 lb 9.6 oz (65.137 kg)  SpO2:  93% 90% 91%   Lethargic but arousable  HEENT WNL + wheezes RRR no murmurs Abd soft Ext warm, no edema   BMP Latest Ref Rng 06/05/2015 06/05/2015 06/04/2015  Glucose 65 - 99 mg/dL - 125(H) -  BUN 6 - 20 mg/dL - 47(H) -  Creatinine 0.61 - 1.24 mg/dL - 1.13 -  Sodium 135 - 145 mmol/L 152(H) 151(H) 150(H)  Potassium 3.5 - 5.1 mmol/L - 4.0 -  Chloride 101 - 111 mmol/L - 120(H) -  CO2 22 - 32 mmol/L - 23 -  Calcium 8.9 - 10.3 mg/dL - 8.7(L) -    CBC Latest Ref Rng 06/04/2015 06/01/2015 05/28/2015  WBC 3.8 - 10.6 K/uL 10.1 5.0 6.1  Hemoglobin 13.0 - 18.0 g/dL 13.6 11.6(L) 11.1(L)  Hematocrit 40.0 - 52.0 % 41.1 35.0(L) 32.8(L)  Platelets 150 - 440 K/uL 256 242 206     IMPRESSION: 70 yo white male with ventral hernia repair with bowel ischemia s/p Intubation x 2 now extubated tx floor, slight increased WOB with wheezing with chronic ETOH abuse Heavy smoker clinical signs of COPD Heavy alcohol abuse Hospital associated  delirium  PLAN/REC: Cont supplemental O2 as needed Continue nebulized steroids and bronchodilators Start IV solumedrol 40 BID Continue free water repletion advancement of diet per surgery Increase Quetiapine Cont low dose PRN lorazepam Will follow up   The Patient requires high complexity decision making for assessment and support, frequent evaluation and titration of therapies.  Family are satisfied with Plan of action and management. All questions answered  Corrin Parker, M.D.  [facility]  Medical Director [place] Director [facility] Cardio-Pulmonary Department

## 2015-07-19 ENCOUNTER — Ambulatory Visit: Payer: Medicare PPO | Admitting: *Deleted

## 2015-07-19 ENCOUNTER — Ambulatory Visit
Admission: RE | Admit: 2015-07-19 | Discharge: 2015-07-19 | Disposition: A | Discharge status: Home / Self Care | Payer: Medicare PPO | Source: Ambulatory Visit | Attending: Vascular Surgery | Admitting: Vascular Surgery

## 2015-07-19 ENCOUNTER — Encounter
Admission: RE | Disposition: A | Discharge status: Home / Self Care | Payer: Self-pay | Source: Ambulatory Visit | Attending: Vascular Surgery

## 2015-07-19 ENCOUNTER — Encounter: Payer: Self-pay | Admitting: *Deleted

## 2015-07-19 DIAGNOSIS — L97519 Non-pressure chronic ulcer of other part of right foot with unspecified severity: Secondary | ICD-10-CM | POA: Diagnosis not present

## 2015-07-19 DIAGNOSIS — Z7951 Long term (current) use of inhaled steroids: Secondary | ICD-10-CM | POA: Insufficient documentation

## 2015-07-19 DIAGNOSIS — I10 Essential (primary) hypertension: Secondary | ICD-10-CM | POA: Diagnosis not present

## 2015-07-19 DIAGNOSIS — K219 Gastro-esophageal reflux disease without esophagitis: Secondary | ICD-10-CM | POA: Insufficient documentation

## 2015-07-19 DIAGNOSIS — Z7982 Long term (current) use of aspirin: Secondary | ICD-10-CM | POA: Insufficient documentation

## 2015-07-19 DIAGNOSIS — F17200 Nicotine dependence, unspecified, uncomplicated: Secondary | ICD-10-CM | POA: Diagnosis not present

## 2015-07-19 DIAGNOSIS — I70261 Atherosclerosis of native arteries of extremities with gangrene, right leg: Secondary | ICD-10-CM | POA: Insufficient documentation

## 2015-07-19 DIAGNOSIS — I739 Peripheral vascular disease, unspecified: Secondary | ICD-10-CM | POA: Diagnosis not present

## 2015-07-19 DIAGNOSIS — E785 Hyperlipidemia, unspecified: Secondary | ICD-10-CM | POA: Diagnosis not present

## 2015-07-19 DIAGNOSIS — I70262 Atherosclerosis of native arteries of extremities with gangrene, left leg: Secondary | ICD-10-CM | POA: Diagnosis not present

## 2015-07-19 DIAGNOSIS — I714 Abdominal aortic aneurysm, without rupture: Secondary | ICD-10-CM | POA: Diagnosis not present

## 2015-07-19 DIAGNOSIS — I70201 Unspecified atherosclerosis of native arteries of extremities, right leg: Secondary | ICD-10-CM | POA: Diagnosis not present

## 2015-07-19 DIAGNOSIS — M869 Osteomyelitis, unspecified: Secondary | ICD-10-CM | POA: Diagnosis not present

## 2015-07-19 DIAGNOSIS — L97511 Non-pressure chronic ulcer of other part of right foot limited to breakdown of skin: Secondary | ICD-10-CM | POA: Insufficient documentation

## 2015-07-19 DIAGNOSIS — I96 Gangrene, not elsewhere classified: Secondary | ICD-10-CM

## 2015-07-19 DIAGNOSIS — I70235 Atherosclerosis of native arteries of right leg with ulceration of other part of foot: Secondary | ICD-10-CM | POA: Diagnosis not present

## 2015-07-19 DIAGNOSIS — Z79899 Other long term (current) drug therapy: Secondary | ICD-10-CM | POA: Insufficient documentation

## 2015-07-19 DIAGNOSIS — J449 Chronic obstructive pulmonary disease, unspecified: Secondary | ICD-10-CM | POA: Diagnosis not present

## 2015-07-19 DIAGNOSIS — F1010 Alcohol abuse, uncomplicated: Secondary | ICD-10-CM | POA: Insufficient documentation

## 2015-07-19 DIAGNOSIS — R0989 Other specified symptoms and signs involving the circulatory and respiratory systems: Secondary | ICD-10-CM | POA: Diagnosis not present

## 2015-07-19 HISTORY — PX: AMPUTATION: SHX166

## 2015-07-19 SURGERY — AMPUTATION DIGIT
Anesthesia: General | Site: Foot | Laterality: Right | Wound class: Dirty or Infected

## 2015-07-19 MED ORDER — HYDROMORPHONE HCL 1 MG/ML IJ SOLN
INTRAMUSCULAR | Status: AC
  Filled 2015-07-19: qty 1

## 2015-07-19 MED ORDER — PHENYLEPHRINE HCL 10 MG/ML IJ SOLN
INTRAMUSCULAR | Status: DC | PRN
  Administered 2015-07-19: 100 ug via INTRAVENOUS

## 2015-07-19 MED ORDER — HYDROMORPHONE HCL 1 MG/ML IJ SOLN
INTRAMUSCULAR | Status: AC
  Administered 2015-07-19: 0.5 mg via INTRAVENOUS
  Filled 2015-07-19: qty 1

## 2015-07-19 MED ORDER — HYDROMORPHONE HCL 1 MG/ML IJ SOLN
0.2500 mg | INTRAMUSCULAR | Status: DC | PRN
  Administered 2015-07-19 (×5): 0.5 mg via INTRAVENOUS

## 2015-07-19 MED ORDER — MIDAZOLAM HCL 2 MG/2ML IJ SOLN
INTRAMUSCULAR | Status: DC | PRN
  Administered 2015-07-19: 2 mg via INTRAVENOUS

## 2015-07-19 MED ORDER — FENTANYL CITRATE (PF) 100 MCG/2ML IJ SOLN
INTRAMUSCULAR | Status: DC | PRN
  Administered 2015-07-19: 50 ug via INTRAVENOUS

## 2015-07-19 MED ORDER — PROPOFOL 10 MG/ML IV BOLUS
INTRAVENOUS | Status: DC | PRN
  Administered 2015-07-19: 50 mg via INTRAVENOUS
  Administered 2015-07-19: 30 mg via INTRAVENOUS
  Administered 2015-07-19: 120 mg via INTRAVENOUS

## 2015-07-19 MED ORDER — LACTATED RINGERS IV SOLN
INTRAVENOUS | Status: DC
  Administered 2015-07-19 (×3): via INTRAVENOUS

## 2015-07-19 MED ORDER — ONDANSETRON HCL 4 MG/2ML IJ SOLN
4.0000 mg | Freq: Once | INTRAMUSCULAR | Status: DC | PRN
Start: 2015-07-19 — End: 2015-07-19

## 2015-07-19 MED ORDER — HYDROMORPHONE HCL 1 MG/ML IJ SOLN: 0.5000 mg | INTRAMUSCULAR | Status: DC | PRN

## 2015-07-19 MED ORDER — LIDOCAINE HCL (CARDIAC) 20 MG/ML IV SOLN
INTRAVENOUS | Status: DC | PRN
  Administered 2015-07-19: 40 mg via INTRAVENOUS

## 2015-07-19 MED ORDER — CEFAZOLIN SODIUM-DEXTROSE 2-4 GM/100ML-% IV SOLN
INTRAVENOUS | Status: AC
  Filled 2015-07-19: qty 100

## 2015-07-19 MED ORDER — HYDROCODONE-ACETAMINOPHEN 5-325 MG PO TABS: 1.0000 | ORAL_TABLET | Freq: Four times a day (QID) | ORAL | Status: DC | PRN

## 2015-07-19 MED ORDER — CEFAZOLIN SODIUM-DEXTROSE 2-4 GM/100ML-% IV SOLN: 2.0000 g | INTRAVENOUS | Status: DC

## 2015-07-19 SURGICAL SUPPLY — 32 items
BANDAGE ACE 4X5 VEL STRL LF (GAUZE/BANDAGES/DRESSINGS) ×2 IMPLANT
BANDAGE ELASTIC 6 LF NS (GAUZE/BANDAGES/DRESSINGS) ×2 IMPLANT
BNDG GAUZE 4.5X4.1 6PLY STRL (MISCELLANEOUS) ×4 IMPLANT
BRUSH SCRUB 4% CHG (MISCELLANEOUS) ×2 IMPLANT
CANISTER SUCT 1200ML W/VALVE (MISCELLANEOUS) ×2 IMPLANT
CHLORAPREP W/TINT 26ML (MISCELLANEOUS) ×2 IMPLANT
ELECT CAUTERY BLADE 6.4 (BLADE) ×2 IMPLANT
ELECT REM PT RETURN 9FT ADLT (ELECTROSURGICAL) ×2
ELECTRODE REM PT RTRN 9FT ADLT (ELECTROSURGICAL) ×1 IMPLANT
GAUZE FLUFF 18X24 1PLY STRL (GAUZE/BANDAGES/DRESSINGS) ×2 IMPLANT
GAUZE PETRO XEROFOAM 1X8 (MISCELLANEOUS) ×2 IMPLANT
GAUZE SPONGE 4X4 12PLY STRL (GAUZE/BANDAGES/DRESSINGS) ×2 IMPLANT
GAUZE XEROFORM 4X4 STRL (GAUZE/BANDAGES/DRESSINGS) ×2 IMPLANT
GLOVE BIO SURGEON STRL SZ7 (GLOVE) ×2 IMPLANT
GOWN STRL REUS W/ TWL LRG LVL3 (GOWN DISPOSABLE) ×1 IMPLANT
GOWN STRL REUS W/ TWL XL LVL3 (GOWN DISPOSABLE) ×1 IMPLANT
GOWN STRL REUS W/TWL LRG LVL3 (GOWN DISPOSABLE) ×1
GOWN STRL REUS W/TWL XL LVL3 (GOWN DISPOSABLE) ×1
HANDLE YANKAUER SUCT BULB TIP (MISCELLANEOUS) ×4 IMPLANT
KIT RM TURNOVER STRD PROC AR (KITS) ×2 IMPLANT
LABEL OR SOLS (LABEL) ×2 IMPLANT
NS IRRIG 1000ML POUR BTL (IV SOLUTION) ×2 IMPLANT
PACK EXTREMITY ARMC (MISCELLANEOUS) ×2 IMPLANT
PAD PREP 24X41 OB/GYN DISP (PERSONAL CARE ITEMS) ×2 IMPLANT
SOL PREP PVP 2OZ (MISCELLANEOUS) ×2
SOLUTION PREP PVP 2OZ (MISCELLANEOUS) ×1 IMPLANT
SPONGE LAP 18X18 5 PK (GAUZE/BANDAGES/DRESSINGS) ×2 IMPLANT
STOCKINETTE BIAS CUT 6 980064 (GAUZE/BANDAGES/DRESSINGS) ×2 IMPLANT
STOCKINETTE STRL 6IN 960660 (GAUZE/BANDAGES/DRESSINGS) ×2 IMPLANT
SUT ETHILON 3-0 FS-10 30 BLK (SUTURE) ×2
SUT VIC AB 2-0 UR6 27 (SUTURE) ×2 IMPLANT
SUTURE EHLN 3-0 FS-10 30 BLK (SUTURE) ×1 IMPLANT

## 2015-07-19 NOTE — Anesthesia Postprocedure Evaluation (Signed)
Anesthesia Post Note  Patient: Brady Smith  Procedure(s) Performed: Procedure(s) (LRB): AMPUTATION DIGIT ( RIGHT FOOT FIFTH TOE ) (Right)  Patient location during evaluation: PACU Anesthesia Type: General Level of consciousness: awake Pain management: pain level controlled Vital Signs Assessment: post-procedure vital signs reviewed and stable Respiratory status: spontaneous breathing Cardiovascular status: blood pressure returned to baseline Postop Assessment: no headache Anesthetic complications: no    Last Vitals:  Filed Vitals:   07/19/15 1244 07/19/15 1313  BP: 150/80 141/79  Pulse: 80 73  Temp: 36.6 C   Resp: 17 18    Last Pain:  Filed Vitals:   07/19/15 1314  PainSc: 5                  Soul Deveney M

## 2015-07-19 NOTE — Anesthesia Procedure Notes (Signed)
Procedure Name: LMA Insertion Date/Time: 07/19/2015 11:30 AM Performed by: Allean Found Pre-anesthesia Checklist: Patient identified, Emergency Drugs available, Suction available, Patient being monitored and Timeout performed Patient Re-evaluated:Patient Re-evaluated prior to inductionOxygen Delivery Method: Circle system utilized Preoxygenation: Pre-oxygenation with 100% oxygen Intubation Type: IV induction LMA: LMA inserted

## 2015-07-19 NOTE — Transfer of Care (Signed)
Immediate Anesthesia Transfer of Care Note  Patient: Brady Smith  Procedure(s) Performed: Procedure(s): AMPUTATION DIGIT ( RIGHT FOOT FIFTH TOE ) (Right)  Patient Location: PACU  Anesthesia Type:General  Level of Consciousness: sedated  Airway & Oxygen Therapy: Patient Spontanous Breathing and Patient connected to face mask oxygen  Post-op Assessment: Report given to RN and Post -op Vital signs reviewed and stable  Post vital signs: Reviewed and stable  Last Vitals:  Filed Vitals:   07/19/15 0831  BP: 112/75  Pulse: 95  Temp: 35.3 C  Resp: 16    Last Pain:  Filed Vitals:   07/19/15 0838  PainSc: 3          Complications: No apparent anesthesia complications

## 2015-07-19 NOTE — Op Note (Signed)
  OPERATIVE NOTE   PROCEDURE: Right fifth toe Ray amputation  PRE-OPERATIVE DIAGNOSIS: Right fifth toe gangrene  POST-OPERATIVE DIAGNOSIS: same as above  SURGEON: Leotis Pain, MD  ASSISTANT(S): none  ANESTHESIA: general  ESTIMATED BLOOD LOSS: minimal  FINDING(S): none  SPECIMEN(S): right fifth toe and metatarsal head  INDICATIONS:  Patient is a 70 y.o.male who presents with right fifth toe gangrene. The patient is scheduled for digital amputation. I discussed in depth with the patient the risks, benefits, and alternatives to this procedure. The patient is aware that the risk of this operation included but are not limited to: bleeding, infection, myocardial infarction, stroke, death, failure to heal amputation wound, and possible need for more proximal amputation. The patient is aware of the risks and agrees proceed forward with the procedure.  DESCRIPTION: After full informed written consent was obtained from the patient, the patient was brought back to the operating room, and placed supine upon the operating table. Prior to induction, the patient received IV antibiotics. The patient was then prepped and draped in the standard fashion. Curvilinear incision was made at the base of the right fifth toe. We dissected down to the bone with electrocautery and sharp dissection. The digital vessels were controlled with electrocautery. The fifth toe was removed its entirety with dissection with electrocautery and blunt dissection. The metatarsal head was taken back with small bone cutter and Ronjours to allow tension-free closure. The wound was then closed with figure-of-eight 2-0 Vicryl and several 3-0 nylons. Sterile dressing was placed.  COMPLICATIONS: none  CONDITION: stable   DEW,JASON, MD 07/19/2015 12:08 PM

## 2015-07-19 NOTE — Anesthesia Preprocedure Evaluation (Signed)
Anesthesia Evaluation  Patient identified by MRN, date of birth, ID band Patient awake    Reviewed: Allergy & Precautions, NPO status , Patient's Chart, lab work & pertinent test results  Airway Mallampati: II  TM Distance: >3 FB Neck ROM: Limited    Dental  (+) Poor Dentition, Missing   Pulmonary pneumonia, resolved, COPD,  COPD inhaler, Current Smoker,    Pulmonary exam normal        Cardiovascular Exercise Tolerance: Poor hypertension, Pt. on medications and Pt. on home beta blockers + Peripheral Vascular Disease  Normal cardiovascular exam     Neuro/Psych    GI/Hepatic GERD  Medicated and Controlled,  Endo/Other    Renal/GU      Musculoskeletal   Abdominal (+)  Abdomen: soft.    Peds  Hematology   Anesthesia Other Findings   Reproductive/Obstetrics                             Anesthesia Physical Anesthesia Plan  ASA: III  Anesthesia Plan: General   Post-op Pain Management:    Induction: Intravenous  Airway Management Planned: LMA  Additional Equipment:   Intra-op Plan:   Post-operative Plan: Extubation in OR  Informed Consent: I have reviewed the patients History and Physical, chart, labs and discussed the procedure including the risks, benefits and alternatives for the proposed anesthesia with the patient or authorized representative who has indicated his/her understanding and acceptance.     Plan Discussed with: CRNA  Anesthesia Plan Comments: (Smoker, COPD, PVD, hx bladder CA with ileal conduit.)        Anesthesia Quick Evaluation

## 2015-07-19 NOTE — H&P (Signed)
  [facility] History & Physical Update  The patient was interviewed and re-examined.  The patient's previous History and Physical has been reviewed and is unchanged.  There is no change in the plan of care. We plan to proceed with the scheduled procedure.  DEW,JASON, MD  07/19/2015, 10:27 AM

## 2015-07-21 LAB — SURGICAL PATHOLOGY

## 2015-07-25 ENCOUNTER — Encounter: Payer: Self-pay | Admitting: Internal Medicine

## 2015-07-25 ENCOUNTER — Ambulatory Visit (INDEPENDENT_AMBULATORY_CARE_PROVIDER_SITE_OTHER): Payer: Medicare PPO | Admitting: Internal Medicine

## 2015-07-25 VITALS — BP 120/78 | HR 94 | Temp 97.4°F | Resp 16 | Ht 68.0 in | Wt 141.0 lb

## 2015-07-25 DIAGNOSIS — Z89421 Acquired absence of other right toe(s): Secondary | ICD-10-CM | POA: Diagnosis not present

## 2015-07-25 DIAGNOSIS — Z89429 Acquired absence of other toe(s), unspecified side: Secondary | ICD-10-CM | POA: Insufficient documentation

## 2015-07-25 MED ORDER — GABAPENTIN 300 MG PO CAPS: 300.0000 mg | ORAL_CAPSULE | Freq: Three times a day (TID) | ORAL | Status: DC

## 2015-07-25 MED ORDER — AMOXICILLIN-POT CLAVULANATE 875-125 MG PO TABS: 1.0000 | ORAL_TABLET | Freq: Two times a day (BID) | ORAL | Status: DC

## 2015-07-25 MED ORDER — OXYCODONE-ACETAMINOPHEN 10-325 MG PO TABS: 1.0000 | ORAL_TABLET | Freq: Three times a day (TID) | ORAL | Status: DC | PRN
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
## 2015-07-31 DIAGNOSIS — E785 Hyperlipidemia, unspecified: Secondary | ICD-10-CM | POA: Diagnosis not present

## 2015-07-31 DIAGNOSIS — I70235 Atherosclerosis of native arteries of right leg with ulceration of other part of foot: Secondary | ICD-10-CM | POA: Diagnosis not present

## 2015-07-31 DIAGNOSIS — R0989 Other specified symptoms and signs involving the circulatory and respiratory systems: Secondary | ICD-10-CM | POA: Diagnosis not present

## 2015-07-31 DIAGNOSIS — F17200 Nicotine dependence, unspecified, uncomplicated: Secondary | ICD-10-CM | POA: Diagnosis not present

## 2015-07-31 DIAGNOSIS — I70261 Atherosclerosis of native arteries of extremities with gangrene, right leg: Secondary | ICD-10-CM | POA: Diagnosis not present

## 2015-07-31 DIAGNOSIS — I714 Abdominal aortic aneurysm, without rupture: Secondary | ICD-10-CM | POA: Diagnosis not present

## 2015-07-31 DIAGNOSIS — I739 Peripheral vascular disease, unspecified: Secondary | ICD-10-CM | POA: Diagnosis not present

## 2015-07-31 DIAGNOSIS — I70262 Atherosclerosis of native arteries of extremities with gangrene, left leg: Secondary | ICD-10-CM | POA: Diagnosis not present

## 2015-07-31 DIAGNOSIS — I10 Essential (primary) hypertension: Secondary | ICD-10-CM | POA: Diagnosis not present

## 2015-08-03 ENCOUNTER — Telehealth: Payer: Self-pay | Admitting: General Surgery

## 2015-08-03 NOTE — Telephone Encounter (Signed)
Patient is nauseated and has a little stomach ache. He is taking pepto bismol which seems to help. For pain he is taking Clavulen. He is taking 1 tablet 2 x's daily. He has 4 more pills left. Is this hurting his stomach? Dr. Adonis Huguenin did his surgery.

## 2015-08-08 NOTE — Telephone Encounter (Signed)
Called patient and had to leave him a voicemail to return my call. 

## 2015-08-09 ENCOUNTER — Telehealth: Payer: Self-pay

## 2015-08-09 NOTE — Telephone Encounter (Signed)
Called patient and left a voicemail to return my call.

## 2015-08-09 NOTE — Telephone Encounter (Signed)
Patient left VM to call regarding question he has. No VM when calling back.

## 2015-08-11 ENCOUNTER — Encounter: Payer: Self-pay | Admitting: Internal Medicine

## 2015-08-11 ENCOUNTER — Ambulatory Visit (INDEPENDENT_AMBULATORY_CARE_PROVIDER_SITE_OTHER): Payer: Medicare PPO | Admitting: Internal Medicine

## 2015-08-11 VITALS — BP 118/82 | HR 67 | Resp 16 | Ht 68.0 in | Wt 145.0 lb

## 2015-08-11 DIAGNOSIS — G8918 Other acute postprocedural pain: Secondary | ICD-10-CM | POA: Diagnosis not present

## 2015-08-11 DIAGNOSIS — Z89421 Acquired absence of other right toe(s): Secondary | ICD-10-CM

## 2015-08-11 DIAGNOSIS — I96 Gangrene, not elsewhere classified: Secondary | ICD-10-CM | POA: Diagnosis not present

## 2015-08-11 DIAGNOSIS — K460 Unspecified abdominal hernia with obstruction, without gangrene: Secondary | ICD-10-CM

## 2015-08-11 DIAGNOSIS — K436 Other and unspecified ventral hernia with obstruction, without gangrene: Secondary | ICD-10-CM

## 2015-08-11 MED ORDER — OXYCODONE-ACETAMINOPHEN 10-325 MG PO TABS: 1.0000 | ORAL_TABLET | Freq: Three times a day (TID) | ORAL | Status: DC | PRN

## 2015-08-11 NOTE — Progress Notes (Signed)
Date:  08/11/2015   Name:  Brady Smith   DOB:  1946/02/04   MRN:  [id number]   Chief Complaint: Toe Pain Patient is about 3 weeks post amputation of right 5th toe for gangrene.  He is still having drainage and pain.  He is changing the dressing daily.  He says he still has stitches - Dr. Lucky Cowboy can not see him for a month. On further investigation - the only provider in the office last week and this week is a PA and Brady Smith stated that he only wanted to see the doctor. He has trouble sleeping due to pain.  He never picked up gabapentin that was prescribed.  He also never started Plavix. He has been taking the oxycodone that I gave him three weeks ago.   Review of Systems  Constitutional: Negative for fever, chills and fatigue.  Respiratory: Negative for shortness of breath.   Cardiovascular: Negative for chest pain.  Skin: Positive for wound. Negative for color change and rash.    Patient Active Problem List   Diagnosis Date Noted  . S/P amputation of lesser toe ([place]) 07/25/2015  . Peripheral vascular disease ([place]) 07/14/2015  . Gangrene of toe ([place]) 06/16/2015  . Acute respiratory failure ([place]) 05/23/2015  . ETOH abuse 05/23/2015  . Small bowel obstruction ([place]) 05/18/2015  . Essential hypertension, malignant 05/18/2015  . Urinary retention 05/18/2015  . Choroidal nevus, right eye 02/08/2015  . Carpal tunnel syndrome 01/13/2015  . H/O gastrointestinal disease 01/13/2015  . Phobia 01/13/2015  . History of primary malignant neoplasm of urinary bladder 01/13/2015  . Compulsive tobacco user syndrome 01/13/2015  . Familial multiple lipoprotein-type hyperlipidemia 01/13/2015  . COPD (chronic obstructive pulmonary disease) ([place]) 06/11/2011  . Smoker 06/11/2011  . Pulmonary nodule 06/11/2011    Prior to Admission medications   Medication Sig Start Date End Date Taking? Authorizing Provider  aspirin EC 81 MG tablet Take 81 mg by mouth every other day.   Yes Historical Provider,  MD  clopidogrel (PLAVIX) 75 MG tablet Take 1 tablet (75 mg total) by mouth daily. 07/14/15  Yes Glean Hess, MD  diltiazem (CARDIZEM CD) 120 MG 24 hr capsule Take 1 capsule (120 mg total) by mouth daily. 06/08/15  Yes Clayburn Pert, MD  gabapentin (NEURONTIN) 300 MG capsule Take 1 capsule (300 mg total) by mouth 3 (three) times daily. 07/25/15  Yes Glean Hess, MD  metoprolol tartrate (LOPRESSOR) 25 MG tablet Take 1 tablet (25 mg total) by mouth 2 (two) times daily. 06/08/15  Yes Clayburn Pert, MD  pantoprazole (PROTONIX) 40 MG tablet Take 1 tablet (40 mg total) by mouth daily. 07/14/15  Yes Glean Hess, MD  oxyCODONE-acetaminophen (PERCOCET) 10-325 MG tablet Take 1 tablet by mouth every 8 (eight) hours as needed for pain. 07/25/15   Glean Hess, MD    No Known Allergies  Past Surgical History  Procedure Laterality Date  . Hernia repair    . Cholecystectomy    . Cystectomy w/ continent diversion  1996  . Prostatectomy  1996  . Tonsillectomy    . Colonoscopy  2000  . Laparotomy N/A 05/20/2015    Procedure: EXPLORATORY LAPAROTOMY;  Surgeon: Clayburn Pert, MD;  Location: [facility];  Service: General;  Laterality: N/A;  . Bowel resection  05/20/2015    Procedure: SMALL BOWEL RESECTION;  Surgeon: Clayburn Pert, MD;  Location: [facility];  Service: General;;  . Peripheral vascular catheterization N/A 07/03/2015    Procedure:  Abdominal Aortogram w/Lower Extremity;  Surgeon: Algernon Huxley, MD;  Location: [place] [facility];  Service: Cardiovascular;  Laterality: N/A;  . Peripheral vascular catheterization  07/03/2015    Procedure: Lower Extremity Intervention;  Surgeon: Algernon Huxley, MD;  Location: [place] [facility];  Service: Cardiovascular;;  . Amputation Right 07/19/2015    Procedure: AMPUTATION DIGIT ( RIGHT FOOT FIFTH TOE );  Surgeon: Algernon Huxley, MD;  Location: [facility];  Service: Vascular;  Laterality: Right;    Social History  Substance Use Topics  . Smoking status:  Current Every Day Smoker -- 2.00 packs/day for 57 years    Types: Cigarettes  . Smokeless tobacco: Never Used     Comment: patient not ready to quit  . Alcohol Use: 8.4 oz/week    0 Cans of beer, 14 Standard drinks or equivalent per week     Comment: quit a few weeks away, before then 2/day     Medication list has been reviewed and updated.   Physical Exam  Constitutional: He is oriented to person, place, and time. He appears well-developed. No distress.  HENT:  Head: Normocephalic and atraumatic.  Cardiovascular: Normal rate and regular rhythm.   Pulses:      Dorsalis pedis pulses are 1+ on the right side, and 1+ on the left side.  Pulmonary/Chest: Effort normal. No respiratory distress. He has decreased breath sounds.  Musculoskeletal: Normal range of motion.  Right 5th toe amputation site - healthy pink tissue with serous drainage and scant bleeding.  Stitches noted - some deep to the surgical pocket.  Site very painful but there is no odor, redness or swelling as seen last visit.  Right great toe - minimally ingrown nail medially.  Slightly tender but no fluctuant area, drainage or increase in warmth.  Neurological: He is alert and oriented to person, place, and time.  Skin: Skin is warm and dry. No rash noted.  Psychiatric: His behavior is normal. Thought content normal. His mood appears anxious.    BP 118/82 mmHg  Pulse 67  Resp 16  Ht 5\' 8"  (1.727 m)  Wt 145 lb (65.772 kg)  BMI 22.05 kg/m2  SpO2 98%  Assessment and Plan: 1. S/P amputation of lesser toe, right (HCC) Sutures are past the time to be removed I do not know how many there are but have scheduled him with Vascular surgery PA on 08/17/15.   Patient agrees to let PA removed the sutures  2. Post-operative pain Instructed to begin Gabapentin previously prescribed - oxyCODONE-acetaminophen (PERCOCET) 10-325 MG tablet; Take 1 tablet by mouth every 8 (eight) hours as needed for pain.  Dispense: 20 tablet; Refill:  0  3. Incarcerated ventral hernia Resolved after surgery  4. Gangrene of toe ([place]) Amputated with slow healing as above   Halina Maidens, MD [facility]  08/11/2015

## 2015-08-11 NOTE — Patient Instructions (Signed)
Pick up Plavix and Gabapentin.

## 2015-08-16 ENCOUNTER — Telehealth: Payer: Self-pay

## 2015-08-16 NOTE — Telephone Encounter (Signed)
Patient called very upset about Brady Smith when he could not get in with Dr. Lucky Cowboy and kept on having to wait to get sutures removed. I did call them when he was in on 08/11/2015 to find out why sutures were not removed and the nurse stated that patient refused to see PA or NP and they have nothing for Dr. Lucky Cowboy.

## 2015-08-22 ENCOUNTER — Encounter: Payer: Self-pay | Admitting: Medical Oncology

## 2015-08-22 ENCOUNTER — Emergency Department
Admission: EM | Admit: 2015-08-22 | Discharge: 2015-08-22 | Disposition: A | Discharge status: Home / Self Care | Payer: Medicare PPO | Attending: Emergency Medicine | Admitting: Emergency Medicine

## 2015-08-22 DIAGNOSIS — I10 Essential (primary) hypertension: Secondary | ICD-10-CM | POA: Insufficient documentation

## 2015-08-22 DIAGNOSIS — J449 Chronic obstructive pulmonary disease, unspecified: Secondary | ICD-10-CM | POA: Diagnosis not present

## 2015-08-22 DIAGNOSIS — Z8551 Personal history of malignant neoplasm of bladder: Secondary | ICD-10-CM | POA: Insufficient documentation

## 2015-08-22 DIAGNOSIS — Z7982 Long term (current) use of aspirin: Secondary | ICD-10-CM | POA: Diagnosis not present

## 2015-08-22 DIAGNOSIS — Z79899 Other long term (current) drug therapy: Secondary | ICD-10-CM | POA: Insufficient documentation

## 2015-08-22 DIAGNOSIS — M79674 Pain in right toe(s): Secondary | ICD-10-CM | POA: Diagnosis not present

## 2015-08-22 DIAGNOSIS — F17210 Nicotine dependence, cigarettes, uncomplicated: Secondary | ICD-10-CM | POA: Diagnosis not present

## 2015-08-22 DIAGNOSIS — Z89421 Acquired absence of other right toe(s): Secondary | ICD-10-CM | POA: Insufficient documentation

## 2015-08-22 DIAGNOSIS — Z85528 Personal history of other malignant neoplasm of kidney: Secondary | ICD-10-CM | POA: Diagnosis not present

## 2015-08-22 DIAGNOSIS — F329 Major depressive disorder, single episode, unspecified: Secondary | ICD-10-CM | POA: Diagnosis not present

## 2015-08-22 DIAGNOSIS — I739 Peripheral vascular disease, unspecified: Secondary | ICD-10-CM | POA: Insufficient documentation

## 2015-08-22 NOTE — ED Notes (Signed)
Sterile non stick dressing applied to right foot

## 2015-08-22 NOTE — ED Notes (Signed)
See triage note  States he had his toe amputated 2 months ago  conts to have pain with some drainage . States he had sutures removed but when wound was undressed he had 1 deep suture in place

## 2015-08-22 NOTE — ED Notes (Signed)
Pt reports he had his rt small toe amputated 64 days ago. Pt reports since then he has continued to have pain to foot. Pt reports surgeon has cleared him and says nothing is wrong. Pt referred to pain management clinic.

## 2015-08-22 NOTE — ED Provider Notes (Signed)
[facility] Emergency Department Provider Note  ____________________________________________  Time seen: Approximately 8:19 AM  I have reviewed the triage vital signs and the nursing notes.   HISTORY  Chief Complaint Pain    HPI Brady Smith is a 70 y.o. male , NAD, presents to the emergency department with a two-month history of right foot pain. States he had the right fifth toe amputated over 2 months ago due to gangrene of his own causing. Has had difficulty having follow-up with his vascular surgeon. States he's had pain and bloody discharge from the amputation site since the day of surgery. Has had follow-up with his primary care provider who is been providing pain medications for him. Notes that he is now out of his pain medications but has been unable to see his vascular surgeon for some time. After further review of the patient's records he has refused to see any other medical providers other than Dr. Lucky Cowboy. He has had the opportunity to schedule appointments with other providers within the vascular surgeon's office but has declined. Sutures were removed from the amputation site a few days ago after the patient's primary care provider schedule an appointment with him and urged him to see another provider in my office for care. Patient denies fevers, chills, body aches. No abdominal pain, nausea, vomiting. States his blood pressures have been elevated but denies any chest pain, shortness breath, back pain nor changes in his vision nor numbness, weakness, tingling.   Past Medical History  Diagnosis Date  . History of kidney cancer   . Bladder cancer ([place])   . Pneumonia   . Depression   . GERD (gastroesophageal reflux disease)   . Anxiety   . COPD (chronic obstructive pulmonary disease) ([place])   . Peripheral vascular disease [facility])     Patient Active Problem List   Diagnosis Date Noted  . S/P amputation of lesser toe ([place]) 07/25/2015  . Peripheral vascular  disease ([place]) 07/14/2015  . Acute respiratory failure ([place]) 05/23/2015  . ETOH abuse 05/23/2015  . Small bowel obstruction ([place]) 05/18/2015  . Essential hypertension, malignant 05/18/2015  . Urinary retention 05/18/2015  . Choroidal nevus, right eye 02/08/2015  . Carpal tunnel syndrome 01/13/2015  . H/O gastrointestinal disease 01/13/2015  . Phobia 01/13/2015  . History of primary malignant neoplasm of urinary bladder 01/13/2015  . Compulsive tobacco user syndrome 01/13/2015  . Familial multiple lipoprotein-type hyperlipidemia 01/13/2015  . COPD (chronic obstructive pulmonary disease) ([place]) 06/11/2011  . Pulmonary nodule 06/11/2011    Past Surgical History  Procedure Laterality Date  . Hernia repair    . Cholecystectomy    . Cystectomy w/ continent diversion  1996  . Prostatectomy  1996  . Tonsillectomy    . Colonoscopy  2000  . Laparotomy N/A 05/20/2015    Procedure: EXPLORATORY LAPAROTOMY;  Surgeon: Clayburn Pert, MD;  Location: [facility] ORS;  Service: General;  Laterality: N/A;  . Bowel resection  05/20/2015    Procedure: SMALL BOWEL RESECTION;  Surgeon: Clayburn Pert, MD;  Location: [facility] ORS;  Service: General;;  . Peripheral vascular catheterization N/A 07/03/2015    Procedure: Abdominal Aortogram w/Lower Extremity;  Surgeon: Algernon Huxley, MD;  Location: [place] CV LAB;  Service: Cardiovascular;  Laterality: N/A;  . Peripheral vascular catheterization  07/03/2015    Procedure: Lower Extremity Intervention;  Surgeon: Algernon Huxley, MD;  Location: [place] [facility];  Service: Cardiovascular;;  . Amputation Right 07/19/2015    Procedure: AMPUTATION DIGIT ( RIGHT  FOOT FIFTH TOE );  Surgeon: Algernon Huxley, MD;  Location: [facility] ORS;  Service: Vascular;  Laterality: Right;    Current Outpatient Rx  Name  Route  Sig  Dispense  Refill  . aspirin EC 81 MG tablet   Oral   Take 81 mg by mouth every other day.         . clopidogrel (PLAVIX) 75 MG tablet   Oral   Take 1 tablet (75  mg total) by mouth daily.   30 tablet   11   . gabapentin (NEURONTIN) 300 MG capsule   Oral   Take 1 capsule (300 mg total) by mouth 3 (three) times daily.   90 capsule   0   . oxyCODONE-acetaminophen (PERCOCET) 10-325 MG tablet   Oral   Take 1 tablet by mouth every 8 (eight) hours as needed for pain.   20 tablet   0   . pantoprazole (PROTONIX) 40 MG tablet   Oral   Take 1 tablet (40 mg total) by mouth daily.   30 tablet   5     Allergies Review of patient's allergies indicates no known allergies.  Family History  Problem Relation Age of Onset  . COPD Sister   . Heart disease Mother   . Heart disease Father     Social History Social History  Substance Use Topics  . Smoking status: Current Every Day Smoker -- 2.00 packs/day for 57 years    Types: Cigarettes  . Smokeless tobacco: Never Used     Comment: patient not ready to quit  . Alcohol Use: 8.4 oz/week    0 Cans of beer, 14 Standard drinks or equivalent per week     Comment: quit a few weeks away, before then 2/day     Review of Systems  Constitutional: No fever/chills Cardiovascular: No chest pain. Respiratory: No cough. No shortness of breath. No wheezing.  Gastrointestinal: No abdominal pain.  No nausea, vomiting.  Musculoskeletal: Positive pain about the surgical site of the right fifth toe. Negative for back pain.  Skin: Positive open wound at the surgical site of the right fifth toe. Negative for rash. Neurological: Negative for headaches, focal weakness or numbness. No tingling. 10-point ROS otherwise negative.  ____________________________________________   PHYSICAL EXAM:  VITAL SIGNS: ED Triage Vitals  Enc Vitals Group     BP 08/22/15 0805 155/81 mmHg     Pulse Rate 08/22/15 0805 93     Resp 08/22/15 0805 18     Temp 08/22/15 0805 98.2 F (36.8 C)     Temp Source 08/22/15 0805 Oral     SpO2 08/22/15 0805 100 %     Weight 08/22/15 0805 145 lb (65.772 kg)     Height --      Head Cir  --      Peak Flow --      Pain Score 08/22/15 0805 10     Pain Loc --      Pain Edu? --      Excl. in [place]? --      Constitutional: Alert and oriented. Well appearing and in no acute distress. Eyes: Conjunctivae are normal.  Head: Atraumatic. Cardiovascular: Good peripheral circulation with 2+ pulses noted in the right lower extremity. Respiratory: Normal respiratory effort without tachypnea or retractions.  Musculoskeletal: No lower extremity tenderness nor edema.  No joint effusions. Neurologic:  Normal speech and language. No gross focal neurologic deficits are appreciated.  Skin:  Right fifth toe has  been amputated. Surgical site is still open with evidence of a deep suture with men. No active oozing or weeping. There is good granulation tissue without surrounding erythema or pain to palpation. Skin is warm, dry. No rash noted. Psychiatric: Mood and affect are normal. Speech and behavior are normal. Patient exhibits appropriate insight and judgement.   ____________________________________________   LABS  None ____________________________________________  EKG  None ____________________________________________  RADIOLOGY  None ____________________________________________    PROCEDURES  Procedure(s) performed: None   Medications - No data to display   ____________________________________________   INITIAL IMPRESSION / ASSESSMENT AND PLAN / ED COURSE  ----------------------------------------- 8:36 AM on 08/22/2015 -----------------------------------------  I have left a message for Dr. Vickki Muff who is on-call for podiatry in regards to consult he on this patient.   I consult with Dr. Vickki Muff via phone at approximately 9 AM. This information regards to the patient's history and current physical exam. Dr. Vickki Muff is happy to see the patient in follow-up next week for evaluation but does agree that he needs to continue with follow-ups with his original surgeon.    Patient's diagnosis is consistent with status post amputation of lesser toe on the right. Patient will be discharged home with instructions to keep his appointment with Dr. Lucky Cowboy on June 16 for follow-up. Patient also relates that he has an appointment scheduled with Dr. Vickki Muff on June 12 but will call his office tomorrow to hopefully get that appointment moved up. Throughout the patient's ED course and his discussions with this provider and nurses he states he has been "lied to" several times by his vascular surgeon in his office and does not want to return for any follow-ups. I discussed with the patient that he can speak with his primary care provider in regards to her referral to another vascular specialist or vascular surgeon for second opinion if it is deemed necessary. Patient did receive a prescription for narcotic pain medications on 08/11/2015 from his primary care provider and may follow up with them for any further pain medications. Patient's dressing was changed Foley was in this emergency department and was told to continue dressing changes as instructed by his surgeon. Patient is given strict ED precautions to return to the ED for any worsening or new symptoms.    ____________________________________________  FINAL CLINICAL IMPRESSION(S) / ED DIAGNOSES  Final diagnoses:  S/P amputation of lesser toe, right ([place])      NEW MEDICATIONS STARTED DURING THIS VISIT:  Discharge Medication List as of 08/22/2015  9:10 AM           Braxton Feathers, PA-C 08/22/15 1702  Wandra Arthurs, MD 08/22/15 2156

## 2015-08-22 NOTE — Discharge Instructions (Signed)
Stump and Prosthesis Care When an arm or leg is removed, it is important to care for the artificial body part that replaces it (prosthesis) and for the remaining end of the arm or leg (stump). Caring for the stump and prosthesis will help you to be comfortable, active, and healthy. HOW TO CARE FOR YOUR STUMP Cleaning Your Skin  Wash your stump with a mild antibacterial soap at least once per day.  Wash your stump after getting dirty or sweaty.  After washing your stump, pat it dry and let it air-dry for 5-10 minutes.  Do not soak your stump in a warm or hot bath for longer than 20 minutes at a time.  Avoid shaving hair on the stump. Hair that grows out after being shaved is more easily irritated by the prosthesis. Using Skin Care Products  Apply ointment to your surgical scar if your health care provider instructed you to do so. This can keep the scar soft and help it heal.  Do not put creams and lotions on your stump unless your health care provider says it is okay. If your health care provider says it is okay to put creams and lotions on your stump, do not use lotions that contain petroleum jelly.  Do not use skin care products with an alcohol base. These products can be harmful to your skin. They can also damage the lining of the prosthesis.  Consider using an antiperspirant spray on the skin of the stump if you are prone to sweating. Other Instructions  Every day, look closely at the skin on your stump. Use a mirror with a long handle to check areas you cannot see, or ask a friend or family member to check those areas. Look for areas that appear reddish, swollen, or irritated. Pay extra attention to places where the stump and prosthesis rub together. HOW TO CARE FOR YOUR PROSTHESIS Cleaning Your Prosthesis  Use hot water and antibacterial soap to wash your prosthesis. Attaching Your Prosthesis  Make sure your prosthesis is clean before you attach it to your stump. All the parts  that touch your skin should be clean and dry.  Be sure you understand how to attach the prosthesis. A prosthetic specialist (prosthetist) can show you how to do this. It is a good idea to practice several times while he or she watches.  If you were given wraps or socks to wear under the prosthesis, make sure to wear them. Other Instructions  Exercise and move your prosthesis as recommended by your physical therapist.  Follow your health care provider's instructions about the length of time you should wear your prosthesis. You will likely need to limit the amount of time you wear your prosthesis at first. You may be instructed to increase the time you wear your prosthesis a little bit each day. SEEK MEDICAL CARE IF:  The prosthesis does not seem to fit correctly.  You have an itchy rash or a sore on your stump.  Sweating between the stump and the prosthesis is heavy and efforts to control the sweating do not work. SEEK IMMEDIATE MEDICAL CARE IF:  Your stump is red, swollen, painful to the touch, or hot.  A bad smell develops around the stump.  There is a sore on your stump that is not healing.  Your stump is colder than the upper part of the limb.  Skin on your stump turns gray or black.  There is any drainage coming from your stump.   This information   is not intended to replace advice given to you by your health care provider. Make sure you discuss any questions you have with your health care provider.   Document Released: 06/05/2009 Document Revised: 07/26/2014 Document Reviewed: 03/07/2014 Elsevier Interactive Patient Education 2016 Elsevier Inc.  

## 2015-08-23 ENCOUNTER — Other Ambulatory Visit: Payer: Self-pay | Admitting: Podiatry

## 2015-08-23 ENCOUNTER — Ambulatory Visit: Payer: Self-pay | Admitting: Internal Medicine

## 2015-08-23 DIAGNOSIS — M869 Osteomyelitis, unspecified: Secondary | ICD-10-CM

## 2015-08-23 DIAGNOSIS — Z89421 Acquired absence of other right toe(s): Secondary | ICD-10-CM | POA: Diagnosis not present

## 2015-08-25 DIAGNOSIS — F419 Anxiety disorder, unspecified: Secondary | ICD-10-CM | POA: Diagnosis not present

## 2015-08-25 DIAGNOSIS — I10 Essential (primary) hypertension: Secondary | ICD-10-CM | POA: Diagnosis not present

## 2015-08-25 DIAGNOSIS — Z89421 Acquired absence of other right toe(s): Secondary | ICD-10-CM | POA: Diagnosis not present

## 2015-08-25 DIAGNOSIS — J449 Chronic obstructive pulmonary disease, unspecified: Secondary | ICD-10-CM | POA: Diagnosis not present

## 2015-08-25 DIAGNOSIS — Z7901 Long term (current) use of anticoagulants: Secondary | ICD-10-CM | POA: Diagnosis not present

## 2015-08-25 DIAGNOSIS — T8131XD Disruption of external operation (surgical) wound, not elsewhere classified, subsequent encounter: Secondary | ICD-10-CM | POA: Diagnosis not present

## 2015-08-25 DIAGNOSIS — F329 Major depressive disorder, single episode, unspecified: Secondary | ICD-10-CM | POA: Diagnosis not present

## 2015-08-25 DIAGNOSIS — F17210 Nicotine dependence, cigarettes, uncomplicated: Secondary | ICD-10-CM | POA: Diagnosis not present

## 2015-08-25 DIAGNOSIS — I739 Peripheral vascular disease, unspecified: Secondary | ICD-10-CM | POA: Diagnosis not present

## 2015-08-29 ENCOUNTER — Ambulatory Visit (INDEPENDENT_AMBULATORY_CARE_PROVIDER_SITE_OTHER): Payer: Medicare PPO | Admitting: Internal Medicine

## 2015-08-29 ENCOUNTER — Ambulatory Visit: Payer: Self-pay | Admitting: Internal Medicine

## 2015-08-29 ENCOUNTER — Encounter: Payer: Self-pay | Admitting: Internal Medicine

## 2015-08-29 VITALS — BP 122/82 | HR 103 | Resp 16 | Ht 68.0 in | Wt 140.0 lb

## 2015-08-29 DIAGNOSIS — Z8719 Personal history of other diseases of the digestive system: Secondary | ICD-10-CM

## 2015-08-29 DIAGNOSIS — Z89421 Acquired absence of other right toe(s): Secondary | ICD-10-CM | POA: Diagnosis not present

## 2015-08-29 DIAGNOSIS — I10 Essential (primary) hypertension: Secondary | ICD-10-CM | POA: Diagnosis not present

## 2015-08-29 DIAGNOSIS — F329 Major depressive disorder, single episode, unspecified: Secondary | ICD-10-CM | POA: Diagnosis not present

## 2015-08-29 DIAGNOSIS — F419 Anxiety disorder, unspecified: Secondary | ICD-10-CM | POA: Diagnosis not present

## 2015-08-29 DIAGNOSIS — F17210 Nicotine dependence, cigarettes, uncomplicated: Secondary | ICD-10-CM | POA: Diagnosis not present

## 2015-08-29 DIAGNOSIS — I739 Peripheral vascular disease, unspecified: Secondary | ICD-10-CM | POA: Diagnosis not present

## 2015-08-29 DIAGNOSIS — Z7901 Long term (current) use of anticoagulants: Secondary | ICD-10-CM | POA: Diagnosis not present

## 2015-08-29 DIAGNOSIS — T8131XD Disruption of external operation (surgical) wound, not elsewhere classified, subsequent encounter: Secondary | ICD-10-CM | POA: Diagnosis not present

## 2015-08-29 DIAGNOSIS — J449 Chronic obstructive pulmonary disease, unspecified: Secondary | ICD-10-CM

## 2015-08-29 MED ORDER — GABAPENTIN 300 MG PO CAPS: 300.0000 mg | ORAL_CAPSULE | Freq: Three times a day (TID) | ORAL | Status: DC

## 2015-08-29 MED ORDER — TRAMADOL HCL 50 MG PO TABS: 50.0000 mg | ORAL_TABLET | Freq: Four times a day (QID) | ORAL | Status: AC | PRN

## 2015-08-29 MED ORDER — PANTOPRAZOLE SODIUM 40 MG PO TBEC: 40.0000 mg | DELAYED_RELEASE_TABLET | Freq: Every day | ORAL | Status: DC

## 2015-08-29 NOTE — Progress Notes (Signed)
Date:  08/29/2015   Name:  Brady Smith   DOB:  19-Apr-1945   MRN:  [id number]   Chief Complaint: Hypertension Hypertension This is a chronic problem. The current episode started more than 1 year ago. The problem is unchanged. The problem is controlled. Associated symptoms include shortness of breath. Pertinent negatives include no chest pain or palpitations. There are no compliance problems.    Brady Smith comes in today to check his blood pressure. On questioning he has not had elevated blood pressure in the past but briefly took metoprolol when he was hospitalized.  Toe amputation - he has had a long course with slow healing from his fifth right toe amputation. He continues to have oozing with serosanguineous fluid. No odor or purulent drainage. He has become frustrated with Dr. Lucky Cowboy and is now seeing the podiatrist Dr. Elvina Mattes. Apparently Dr. Elvina Mattes obtained x-rays and is concerned about the possibility of osteo-myelitis. An MRI is scheduled and he is on doxycycline. He has having significant amount of neuropathic pain is partially relieved by tramadol. Tramadol was prescribed to Dr. Elvina Mattes but he has no refills and only 2 tablets remaining. Review of his chart reveals that he was previously on gabapentin but he is not currently aware of taking this medication.  COPD- Brady Smith states that his COPD symptoms are stable. He has occasional cough without discolored phlegm. No significant wheezing. He continues to smoke and is not interested in quitting at this time.  PVD - he is on Plavix and aspirin for prevention. He's reminded that smoking cessation is important to avoid further complications such as gangrene.  Review of Systems  Constitutional: Negative for fever, chills and unexpected weight change.  Respiratory: Positive for cough, shortness of breath and wheezing. Negative for chest tightness.   Cardiovascular: Negative for chest pain, palpitations and leg swelling.  Gastrointestinal: Negative for  abdominal pain, diarrhea and constipation.  Skin: Positive for wound.  Neurological: Negative for syncope and light-headedness.  Psychiatric/Behavioral: The patient is nervous/anxious.     Patient Active Problem List   Diagnosis Date Noted  . S/P amputation of lesser toe ([place]) 07/25/2015  . Absence of toe ([place]) 07/25/2015  . Peripheral vascular disease ([place]) 07/14/2015  . Acute respiratory failure ([place]) 05/23/2015  . ETOH abuse 05/23/2015  . AA (alcohol abuse) 05/23/2015  . Small bowel obstruction ([place]) 05/18/2015  . Essential hypertension, malignant 05/18/2015  . Urinary retention 05/18/2015  . Essential (primary) hypertension 05/18/2015  . Intestinal obstruction ([place]) 05/18/2015  . Bladder retention 05/18/2015  . Choroidal nevus, right eye 02/08/2015  . Benign neoplasm of choroid 02/08/2015  . Carpal tunnel syndrome 01/13/2015  . H/O gastrointestinal disease 01/13/2015  . Phobia 01/13/2015  . History of primary malignant neoplasm of urinary bladder 01/13/2015  . Compulsive tobacco user syndrome 01/13/2015  . Familial multiple lipoprotein-type hyperlipidemia 01/13/2015  . Nicotine addiction 01/13/2015  . HLD (hyperlipidemia) 01/13/2015  . History of neoplasm of bladder 01/13/2015  . H/O disease 01/13/2015  . Neurosis, phobic 01/13/2015  . COPD (chronic obstructive pulmonary disease) ([place]) 06/11/2011  . Pulmonary nodule 06/11/2011  . Chronic obstructive pulmonary disease ([place]) 06/11/2011  . Lung nodule, solitary 06/11/2011    Prior to Admission medications   Medication Sig Start Date End Date Taking? Authorizing Provider  aspirin EC 81 MG tablet Take 81 mg by mouth every other day.   Yes Historical Provider, MD  clopidogrel (PLAVIX) 75 MG tablet Take 1 tablet (75 mg total) by mouth daily. 07/14/15  Yes Glean Hess, MD  docusate sodium (COLACE) 100 MG capsule Take 100 mg by mouth 2 (two) times daily.   Yes Historical Provider, MD  doxycycline (DORYX) 100 MG EC tablet  Take 100 mg by mouth 2 (two) times daily.   Yes Historical Provider, MD  gabapentin (NEURONTIN) 300 MG capsule Take 1 capsule (300 mg total) by mouth 3 (three) times daily. 07/25/15  Yes Glean Hess, MD  pantoprazole (PROTONIX) 40 MG tablet Take 1 tablet (40 mg total) by mouth daily. 07/14/15  Yes Glean Hess, MD  sodium chloride irrigation 0.9 % irrigation Irrigate with as directed once.   Yes Historical Provider, MD  traMADol Veatrice Bourbon) 50 MG tablet Take by mouth. 08/23/15 09/02/15 Yes Historical Provider, MD    No Known Allergies  Past Surgical History  Procedure Laterality Date  . Hernia repair    . Cholecystectomy    . Cystectomy w/ continent diversion  1996  . Prostatectomy  1996  . Tonsillectomy    . Colonoscopy  2000  . Laparotomy N/A 05/20/2015    Procedure: EXPLORATORY LAPAROTOMY;  Surgeon: Clayburn Pert, MD;  Location: [facility] ORS;  Service: General;  Laterality: N/A;  . Bowel resection  05/20/2015    Procedure: SMALL BOWEL RESECTION;  Surgeon: Clayburn Pert, MD;  Location: [facility] ORS;  Service: General;;  . Peripheral vascular catheterization N/A 07/03/2015    Procedure: Abdominal Aortogram w/Lower Extremity;  Surgeon: Algernon Huxley, MD;  Location: [place] [facility];  Service: Cardiovascular;  Laterality: N/A;  . Peripheral vascular catheterization  07/03/2015    Procedure: Lower Extremity Intervention;  Surgeon: Algernon Huxley, MD;  Location: [place] [facility];  Service: Cardiovascular;;  . Amputation Right 07/19/2015    Procedure: AMPUTATION DIGIT ( RIGHT FOOT FIFTH TOE );  Surgeon: Algernon Huxley, MD;  Location: [facility] ORS;  Service: Vascular;  Laterality: Right;    Social History  Substance Use Topics  . Smoking status: Current Every Day Smoker -- 2.00 packs/day for 57 years    Types: Cigarettes  . Smokeless tobacco: Never Used     Comment: patient not ready to quit  . Alcohol Use: 8.4 oz/week    0 Cans of beer, 14 Standard drinks or equivalent per week     Comment: quit  a few weeks away, before then 2/day     Medication list has been reviewed and updated.   Physical Exam  Constitutional: He is oriented to person, place, and time. He appears well-developed. No distress.  HENT:  Head: Normocephalic and atraumatic.  Neck: Normal range of motion. Neck supple.  Cardiovascular: Normal rate, regular rhythm and normal heart sounds.   Pulmonary/Chest: Effort normal. No respiratory distress. He has decreased breath sounds. He has no wheezes. He has no rhonchi.  Musculoskeletal:  Right fifth toe amputation site - dusky skin surrounding; no odor and only minimal sero-sanguinous drainage.  Pulses 1+ DP and PT.  Neurological: He is alert and oriented to person, place, and time.  Skin: Skin is warm and dry. No rash noted.  Psychiatric: His speech is normal and behavior is normal. Thought content normal. His mood appears anxious.  Nursing note and vitals reviewed.   BP 122/82 mmHg  Pulse 103  Resp 16  Ht 5\' 8"  (1.727 m)  Wt 140 lb (63.504 kg)  BMI 21.29 kg/m2  SpO2 100%  Assessment and Plan: 1. Peripheral vascular disease [facility]) Patient reminded of the need to quit smoking to prevent further complications Continue  Plavix and aspirin - traMADol (ULTRAM) 50 MG tablet; Take 1 tablet (50 mg total) by mouth every 6 (six) hours as needed for severe pain.  Dispense: 120 tablet; Refill: 2  2. S/P amputation of lesser toe, right ([facility]) Healing slowly - now being followed by Dr. Elvina Mattes Begin/resume gabapentin Continue home dressing changes Complete course of doxycycline - gabapentin (NEURONTIN) 300 MG capsule; Take 1 capsule (300 mg total) by mouth 3 (three) times daily.  Dispense: 90 capsule; Refill: 3  3. Chronic obstructive pulmonary disease, unspecified COPD type ([place]) stable  4. H/O gastrointestinal disease Resume Pantoprazole for PUD prevention - pantoprazole (PROTONIX) 40 MG tablet; Take 1 tablet (40 mg total) by mouth daily.  Dispense: 30 tablet;  Refill: 5  5. Essential (primary) hypertension Resolved but will continue to monitor   Halina Maidens, MD [facility]  08/29/2015

## 2015-08-30 ENCOUNTER — Other Ambulatory Visit: Payer: Self-pay | Admitting: Podiatry

## 2015-08-30 ENCOUNTER — Ambulatory Visit
Admission: RE | Admit: 2015-08-30 | Discharge: 2015-08-30 | Disposition: A | Discharge status: Home / Self Care | Payer: Medicare PPO | Source: Ambulatory Visit | Attending: Podiatry | Admitting: Podiatry

## 2015-08-30 DIAGNOSIS — M609 Myositis, unspecified: Secondary | ICD-10-CM | POA: Insufficient documentation

## 2015-08-30 DIAGNOSIS — L03115 Cellulitis of right lower limb: Secondary | ICD-10-CM | POA: Diagnosis not present

## 2015-08-30 DIAGNOSIS — M86171 Other acute osteomyelitis, right ankle and foot: Secondary | ICD-10-CM | POA: Insufficient documentation

## 2015-08-30 DIAGNOSIS — M79671 Pain in right foot: Secondary | ICD-10-CM | POA: Diagnosis not present

## 2015-08-30 DIAGNOSIS — M869 Osteomyelitis, unspecified: Secondary | ICD-10-CM

## 2015-08-30 DIAGNOSIS — Z89421 Acquired absence of other right toe(s): Secondary | ICD-10-CM | POA: Diagnosis not present

## 2015-08-30 DIAGNOSIS — M7989 Other specified soft tissue disorders: Secondary | ICD-10-CM | POA: Diagnosis not present

## 2015-08-31 DIAGNOSIS — F329 Major depressive disorder, single episode, unspecified: Secondary | ICD-10-CM | POA: Diagnosis not present

## 2015-08-31 DIAGNOSIS — F17210 Nicotine dependence, cigarettes, uncomplicated: Secondary | ICD-10-CM | POA: Diagnosis not present

## 2015-08-31 DIAGNOSIS — T8131XD Disruption of external operation (surgical) wound, not elsewhere classified, subsequent encounter: Secondary | ICD-10-CM | POA: Diagnosis not present

## 2015-08-31 DIAGNOSIS — I10 Essential (primary) hypertension: Secondary | ICD-10-CM | POA: Diagnosis not present

## 2015-08-31 DIAGNOSIS — Z89421 Acquired absence of other right toe(s): Secondary | ICD-10-CM | POA: Diagnosis not present

## 2015-08-31 DIAGNOSIS — J449 Chronic obstructive pulmonary disease, unspecified: Secondary | ICD-10-CM | POA: Diagnosis not present

## 2015-08-31 DIAGNOSIS — F419 Anxiety disorder, unspecified: Secondary | ICD-10-CM | POA: Diagnosis not present

## 2015-08-31 DIAGNOSIS — I739 Peripheral vascular disease, unspecified: Secondary | ICD-10-CM | POA: Diagnosis not present

## 2015-08-31 DIAGNOSIS — Z7901 Long term (current) use of anticoagulants: Secondary | ICD-10-CM | POA: Diagnosis not present

## 2015-09-04 DIAGNOSIS — Z7901 Long term (current) use of anticoagulants: Secondary | ICD-10-CM | POA: Diagnosis not present

## 2015-09-04 DIAGNOSIS — I739 Peripheral vascular disease, unspecified: Secondary | ICD-10-CM | POA: Diagnosis not present

## 2015-09-04 DIAGNOSIS — I10 Essential (primary) hypertension: Secondary | ICD-10-CM | POA: Diagnosis not present

## 2015-09-04 DIAGNOSIS — F419 Anxiety disorder, unspecified: Secondary | ICD-10-CM | POA: Diagnosis not present

## 2015-09-04 DIAGNOSIS — T8131XD Disruption of external operation (surgical) wound, not elsewhere classified, subsequent encounter: Secondary | ICD-10-CM | POA: Diagnosis not present

## 2015-09-04 DIAGNOSIS — J449 Chronic obstructive pulmonary disease, unspecified: Secondary | ICD-10-CM | POA: Diagnosis not present

## 2015-09-04 DIAGNOSIS — Z89421 Acquired absence of other right toe(s): Secondary | ICD-10-CM | POA: Diagnosis not present

## 2015-09-04 DIAGNOSIS — F17210 Nicotine dependence, cigarettes, uncomplicated: Secondary | ICD-10-CM | POA: Diagnosis not present

## 2015-09-04 DIAGNOSIS — F329 Major depressive disorder, single episode, unspecified: Secondary | ICD-10-CM | POA: Diagnosis not present

## 2015-09-06 DIAGNOSIS — L97512 Non-pressure chronic ulcer of other part of right foot with fat layer exposed: Secondary | ICD-10-CM | POA: Diagnosis not present

## 2015-09-07 DIAGNOSIS — F17210 Nicotine dependence, cigarettes, uncomplicated: Secondary | ICD-10-CM | POA: Diagnosis not present

## 2015-09-07 DIAGNOSIS — I739 Peripheral vascular disease, unspecified: Secondary | ICD-10-CM | POA: Diagnosis not present

## 2015-09-07 DIAGNOSIS — F329 Major depressive disorder, single episode, unspecified: Secondary | ICD-10-CM | POA: Diagnosis not present

## 2015-09-07 DIAGNOSIS — T8131XD Disruption of external operation (surgical) wound, not elsewhere classified, subsequent encounter: Secondary | ICD-10-CM | POA: Diagnosis not present

## 2015-09-07 DIAGNOSIS — J449 Chronic obstructive pulmonary disease, unspecified: Secondary | ICD-10-CM | POA: Diagnosis not present

## 2015-09-07 DIAGNOSIS — I10 Essential (primary) hypertension: Secondary | ICD-10-CM | POA: Diagnosis not present

## 2015-09-07 DIAGNOSIS — Z89421 Acquired absence of other right toe(s): Secondary | ICD-10-CM | POA: Diagnosis not present

## 2015-09-07 DIAGNOSIS — F419 Anxiety disorder, unspecified: Secondary | ICD-10-CM | POA: Diagnosis not present

## 2015-09-07 DIAGNOSIS — Z7901 Long term (current) use of anticoagulants: Secondary | ICD-10-CM | POA: Diagnosis not present

## 2015-09-12 DIAGNOSIS — Z7901 Long term (current) use of anticoagulants: Secondary | ICD-10-CM | POA: Diagnosis not present

## 2015-09-12 DIAGNOSIS — F17210 Nicotine dependence, cigarettes, uncomplicated: Secondary | ICD-10-CM | POA: Diagnosis not present

## 2015-09-12 DIAGNOSIS — I10 Essential (primary) hypertension: Secondary | ICD-10-CM | POA: Diagnosis not present

## 2015-09-12 DIAGNOSIS — J449 Chronic obstructive pulmonary disease, unspecified: Secondary | ICD-10-CM | POA: Diagnosis not present

## 2015-09-12 DIAGNOSIS — T8131XD Disruption of external operation (surgical) wound, not elsewhere classified, subsequent encounter: Secondary | ICD-10-CM | POA: Diagnosis not present

## 2015-09-12 DIAGNOSIS — F419 Anxiety disorder, unspecified: Secondary | ICD-10-CM | POA: Diagnosis not present

## 2015-09-12 DIAGNOSIS — F329 Major depressive disorder, single episode, unspecified: Secondary | ICD-10-CM | POA: Diagnosis not present

## 2015-09-12 DIAGNOSIS — Z89421 Acquired absence of other right toe(s): Secondary | ICD-10-CM | POA: Diagnosis not present

## 2015-09-12 DIAGNOSIS — I739 Peripheral vascular disease, unspecified: Secondary | ICD-10-CM | POA: Diagnosis not present

## 2015-09-13 DIAGNOSIS — L97512 Non-pressure chronic ulcer of other part of right foot with fat layer exposed: Secondary | ICD-10-CM | POA: Diagnosis not present

## 2015-09-13 DIAGNOSIS — M86671 Other chronic osteomyelitis, right ankle and foot: Secondary | ICD-10-CM | POA: Diagnosis not present

## 2015-09-19 DIAGNOSIS — I739 Peripheral vascular disease, unspecified: Secondary | ICD-10-CM | POA: Diagnosis not present

## 2015-09-19 DIAGNOSIS — F17210 Nicotine dependence, cigarettes, uncomplicated: Secondary | ICD-10-CM | POA: Diagnosis not present

## 2015-09-19 DIAGNOSIS — Z89421 Acquired absence of other right toe(s): Secondary | ICD-10-CM | POA: Diagnosis not present

## 2015-09-19 DIAGNOSIS — Z7901 Long term (current) use of anticoagulants: Secondary | ICD-10-CM | POA: Diagnosis not present

## 2015-09-19 DIAGNOSIS — I10 Essential (primary) hypertension: Secondary | ICD-10-CM | POA: Diagnosis not present

## 2015-09-19 DIAGNOSIS — J449 Chronic obstructive pulmonary disease, unspecified: Secondary | ICD-10-CM | POA: Diagnosis not present

## 2015-09-19 DIAGNOSIS — F419 Anxiety disorder, unspecified: Secondary | ICD-10-CM | POA: Diagnosis not present

## 2015-09-19 DIAGNOSIS — F329 Major depressive disorder, single episode, unspecified: Secondary | ICD-10-CM | POA: Diagnosis not present

## 2015-09-19 DIAGNOSIS — T8131XD Disruption of external operation (surgical) wound, not elsewhere classified, subsequent encounter: Secondary | ICD-10-CM | POA: Diagnosis not present

## 2015-09-27 DIAGNOSIS — I779 Disorder of arteries and arterioles, unspecified: Secondary | ICD-10-CM | POA: Diagnosis not present

## 2015-09-27 DIAGNOSIS — M86671 Other chronic osteomyelitis, right ankle and foot: Secondary | ICD-10-CM | POA: Diagnosis not present

## 2015-09-28 DIAGNOSIS — T8131XD Disruption of external operation (surgical) wound, not elsewhere classified, subsequent encounter: Secondary | ICD-10-CM | POA: Diagnosis not present

## 2015-09-28 DIAGNOSIS — I10 Essential (primary) hypertension: Secondary | ICD-10-CM | POA: Diagnosis not present

## 2015-09-28 DIAGNOSIS — Z89421 Acquired absence of other right toe(s): Secondary | ICD-10-CM | POA: Diagnosis not present

## 2015-09-28 DIAGNOSIS — F329 Major depressive disorder, single episode, unspecified: Secondary | ICD-10-CM | POA: Diagnosis not present

## 2015-09-28 DIAGNOSIS — J449 Chronic obstructive pulmonary disease, unspecified: Secondary | ICD-10-CM | POA: Diagnosis not present

## 2015-09-28 DIAGNOSIS — Z7901 Long term (current) use of anticoagulants: Secondary | ICD-10-CM | POA: Diagnosis not present

## 2015-09-28 DIAGNOSIS — F17210 Nicotine dependence, cigarettes, uncomplicated: Secondary | ICD-10-CM | POA: Diagnosis not present

## 2015-09-28 DIAGNOSIS — I739 Peripheral vascular disease, unspecified: Secondary | ICD-10-CM | POA: Diagnosis not present

## 2015-09-28 DIAGNOSIS — F419 Anxiety disorder, unspecified: Secondary | ICD-10-CM | POA: Diagnosis not present

## 2015-09-29 NOTE — Addendum Note (Signed)
Addendum  created 09/29/15 1019 by Nelda Marseille, CRNA   Modules edited: Anesthesia Responsible Staff

## 2015-10-10 DIAGNOSIS — I779 Disorder of arteries and arterioles, unspecified: Secondary | ICD-10-CM | POA: Diagnosis not present

## 2015-10-10 DIAGNOSIS — M86671 Other chronic osteomyelitis, right ankle and foot: Secondary | ICD-10-CM | POA: Diagnosis not present

## 2015-10-10 DIAGNOSIS — L97512 Non-pressure chronic ulcer of other part of right foot with fat layer exposed: Secondary | ICD-10-CM | POA: Diagnosis not present

## 2015-10-13 DIAGNOSIS — H2513 Age-related nuclear cataract, bilateral: Secondary | ICD-10-CM | POA: Diagnosis not present

## 2015-11-07 DIAGNOSIS — M79675 Pain in left toe(s): Secondary | ICD-10-CM | POA: Diagnosis not present

## 2015-11-07 DIAGNOSIS — B351 Tinea unguium: Secondary | ICD-10-CM | POA: Diagnosis not present

## 2015-11-07 DIAGNOSIS — M86671 Other chronic osteomyelitis, right ankle and foot: Secondary | ICD-10-CM | POA: Diagnosis not present

## 2015-11-07 DIAGNOSIS — M79674 Pain in right toe(s): Secondary | ICD-10-CM | POA: Diagnosis not present

## 2015-12-05 DIAGNOSIS — M79674 Pain in right toe(s): Secondary | ICD-10-CM | POA: Diagnosis not present

## 2015-12-05 DIAGNOSIS — M79675 Pain in left toe(s): Secondary | ICD-10-CM | POA: Diagnosis not present

## 2015-12-05 DIAGNOSIS — M86671 Other chronic osteomyelitis, right ankle and foot: Secondary | ICD-10-CM | POA: Diagnosis not present

## 2015-12-05 DIAGNOSIS — B351 Tinea unguium: Secondary | ICD-10-CM | POA: Diagnosis not present

## 2016-01-19 ENCOUNTER — Other Ambulatory Visit: Payer: Self-pay | Admitting: *Deleted

## 2016-01-19 DIAGNOSIS — J441 Chronic obstructive pulmonary disease with (acute) exacerbation: Secondary | ICD-10-CM

## 2016-01-19 NOTE — Patient Outreach (Signed)
[facility]) Care Management  01/19/2016  Brady Smith 04-01-1945 [id number]    RN Health Coach telephone call to patient.  [employer] compliance verified. Per patient this is his mailing address but this is not where he lives. Per patient stated, " [place]   Patient was able to verify date of birth, name and mailing address. Patient stated that he feels nauseated a lot and staggers. Per patient he has not fallen. Patient stated he does not use a walker or a cane. RN noted that patient has hx of of having toe amputated on rt foot . Patient has hx of Copd, hyperlipidemia, Hypertension, per patient heart failure  and kidney cancer. Patient stated that has about 6 or 7 medicines prescribed but he does not take them . Noted plavix is one of the medications prescribed. Per patient he stated that Lipitor was a medicine he was prescribed but he heard about a lot of side effects that could happen and stopped taking. Per patient he has an inhaler but he may pull it out and use every once in a while. This is not listed.  Per patient he has agreed to pharmacy calling him and discussing the medications he should take. Patient has agreed to community nurse coming to evaluate him. Per patient they can call and he will meet them  some where else  If needed.  Plan:  RN will refer this patient to community RN RN will refer to pharmacy

## 2016-01-22 ENCOUNTER — Encounter: Payer: Self-pay | Admitting: Internal Medicine

## 2016-01-22 ENCOUNTER — Ambulatory Visit (INDEPENDENT_AMBULATORY_CARE_PROVIDER_SITE_OTHER): Payer: Medicare PPO | Admitting: Internal Medicine

## 2016-01-22 VITALS — BP 126/84 | HR 86 | Resp 16 | Ht 68.0 in | Wt 158.0 lb

## 2016-01-22 DIAGNOSIS — H6123 Impacted cerumen, bilateral: Secondary | ICD-10-CM

## 2016-01-22 DIAGNOSIS — J431 Panlobular emphysema: Secondary | ICD-10-CM

## 2016-01-22 DIAGNOSIS — I739 Peripheral vascular disease, unspecified: Secondary | ICD-10-CM

## 2016-01-22 DIAGNOSIS — Z89421 Acquired absence of other right toe(s): Secondary | ICD-10-CM

## 2016-01-22 NOTE — Progress Notes (Signed)
Date:  01/22/2016   Name:  Brady Smith   DOB:  1946-03-17   MRN:  [id number]   Chief Complaint: Gait Problem (Was not able to walk without staggering until getting wax out of left ear. May have fluid in ear still. Also look at healing Toe. ) Hyperlipidemia  This is a chronic problem. The problem is uncontrolled. Pertinent negatives include no chest pain or shortness of breath. He is currently on no antihyperlipidemic treatment (had myalgia with lipitor). Risk factors for coronary artery disease include dyslipidemia.  He has stopped or never started all his medication except for aspirin.  Plavix made his skin bruise.  He did not tolerate lipitor years ago (myalgias).  He does not have much heartburn so does not take pantoprazole. He does not know why he was on gabapentin (probably for his toe) but no longer takes it.   Review of Systems  Constitutional: Negative for chills, fatigue and fever.  HENT: Positive for hearing loss and tinnitus.   Eyes: Negative for visual disturbance.  Respiratory: Positive for cough and wheezing. Negative for chest tightness and shortness of breath.   Cardiovascular: Negative for chest pain and palpitations.  Gastrointestinal: Positive for nausea. Negative for diarrhea and vomiting.  Endocrine: Negative for polydipsia and polyuria.  Genitourinary: Negative for dysuria.  Musculoskeletal: Negative for arthralgias.  Skin: Positive for color change.  Neurological: Positive for dizziness. Negative for tremors, light-headedness and headaches.  Psychiatric/Behavioral: Negative for dysphoric mood and sleep disturbance.    Patient Active Problem List   Diagnosis Date Noted  . S/P amputation of lesser toe ([place]) 07/25/2015  . Absence of toe ([place]) 07/25/2015  . Peripheral vascular disease ([place]) 07/14/2015  . AA (alcohol abuse) 05/23/2015  . Urinary retention 05/18/2015  . Choroidal nevus, right eye 02/08/2015  . Carpal tunnel syndrome 01/13/2015  . H/O  gastrointestinal disease 01/13/2015  . History of primary malignant neoplasm of urinary bladder 01/13/2015  . Compulsive tobacco user syndrome 01/13/2015  . HLD (hyperlipidemia) 01/13/2015  . Neurosis, phobic 01/13/2015  . Chronic obstructive pulmonary disease ([place]) 06/11/2011  . Lung nodule, solitary 06/11/2011    Prior to Admission medications       Taking? Authorizing Provider      Yes Historical Provider, MD      Yes Glean Hess, MD      Yes Historical Provider, MD      Yes Glean Hess, MD      Yes Glean Hess, MD      Yes Historical Provider, MD    No Known Allergies  Past Surgical History:  Procedure Laterality Date  . AMPUTATION Right 07/19/2015   Procedure: AMPUTATION DIGIT ( RIGHT FOOT FIFTH TOE );  Surgeon: Algernon Huxley, MD;  Location: [facility] ORS;  Service: Vascular;  Laterality: Right;  . BOWEL RESECTION  05/20/2015   Procedure: SMALL BOWEL RESECTION;  Surgeon: Clayburn Pert, MD;  Location: [facility] ORS;  Service: General;;  . CHOLECYSTECTOMY    . COLONOSCOPY  2000  . CYSTECTOMY W/ CONTINENT DIVERSION  1996  . HERNIA REPAIR    . LAPAROTOMY N/A 05/20/2015   Procedure: EXPLORATORY LAPAROTOMY;  Surgeon: Clayburn Pert, MD;  Location: [facility] ORS;  Service: General;  Laterality: N/A;  . PERIPHERAL VASCULAR CATHETERIZATION N/A 07/03/2015   Procedure: Abdominal Aortogram w/Lower Extremity;  Surgeon: Algernon Huxley, MD;  Location: [place] [facility];  Service: Cardiovascular;  Laterality: N/A;  . PERIPHERAL VASCULAR CATHETERIZATION  07/03/2015   Procedure: Lower  Extremity Intervention;  Surgeon: Algernon Huxley, MD;  Location: [place] [facility];  Service: Cardiovascular;;  . PROSTATECTOMY  1996  . TONSILLECTOMY      Social History  Substance Use Topics  . Smoking status: Current Every Day Smoker    Packs/day: 2.00    Years: 57.00    Types: Cigarettes  . Smokeless tobacco: Never Used     Comment: patient not ready to quit  . Alcohol use 8.4 oz/week    14 Standard  drinks or equivalent per week     Comment: quit a few weeks away, before then 2/day     Medication list has been reviewed and updated.   Physical Exam  Constitutional: He is oriented to person, place, and time. He appears well-developed. No distress.  HENT:  Head: Normocephalic and atraumatic.  Nose: Right sinus exhibits no maxillary sinus tenderness and no frontal sinus tenderness. Left sinus exhibits no maxillary sinus tenderness and no frontal sinus tenderness.  Mouth/Throat: No posterior oropharyngeal erythema.  Partial cerumen in each canal - TM normal (portion visualized)  Neck: Normal range of motion. Carotid bruit is not present. No thyromegaly present.  Cardiovascular: Normal rate, regular rhythm and normal heart sounds.   Pulmonary/Chest: Effort normal. No respiratory distress. He has decreased breath sounds. He has no wheezes. He has no rhonchi.  Musculoskeletal: Normal range of motion.  Absent right 5th toe - site healed  Neurological: He is alert and oriented to person, place, and time. He has normal strength. No cranial nerve deficit or sensory deficit. Coordination and gait normal.  Skin: Skin is warm and dry. No rash noted.  Psychiatric: He has a normal mood and affect. His speech is normal and behavior is normal. Thought content normal.  Nursing note and vitals reviewed.   BP 126/84   Pulse 86   Resp 16   Ht 5\' 8"  (1.727 m)   Wt 158 lb (71.7 kg)   SpO2 97%   BMI 24.02 kg/m   Assessment and Plan: 1. Peripheral vascular disease (HCC) Stable - toe site healed Continue ASA 81 mg  2. Panlobular emphysema (HCC) Stable; uses albuterol PRN  3. Excessive cerumen in both ear canals Continue vinegar or sweet oil drops Return if needed  4. Absence of toe of right foot ([place]) healed  Halina Maidens, MD [facility]  01/22/2016

## 2016-01-23 ENCOUNTER — Other Ambulatory Visit: Payer: Self-pay | Admitting: *Deleted

## 2016-01-23 NOTE — Patient Outreach (Signed)
Telephone call successful, follow up on referral received 10/27 from [place] coach for community nurse case management services.    Spoke with pt, HIPAA verified, discussed purpose of call, follow up on referral for community nurse case services to which pt was aware.   Pt reports having problems understanding his [employer], inquired of RN CM if she could assist with this.   RN CM discussed with pt calling the customer service number on the back of his card, press 0 to speak to a representative to which pt was appreciative.  Pt reports still staggering, saw Dr. [name] yesterday.  RN CM discussed with pt doing a home visit to which pt agreed, requested a call prior to visit.  Pt  Reports he is staying at a boarding house, address provided which is different from mailing address.    Plan:  As discussed with pt, plan to do a home visit next week.    Zara Chess.   [place] [facility]  [phone]

## 2016-01-26 ENCOUNTER — Other Ambulatory Visit: Payer: Self-pay | Admitting: *Deleted

## 2016-01-26 ENCOUNTER — Encounter: Payer: Self-pay | Admitting: *Deleted

## 2016-01-26 NOTE — Patient Outreach (Addendum)
[facility]) Care Management   01/26/2016  HADY BRINER 09-Sep-1945 [id number]  ALPHEUS MERRIMAN is an 70 y.o. male  Subjective:  Pt reports been dizzy, took vinegar and better. Pt reports stopped taking Plavix 3-4 Months ago, was told in the past to take medication or drink beer to thin his blood,chose the  Beer.    Pt reports he has been drinking 40 oz of beer a day but did yesterday started back taking Plavix as well as Doxycycline, so will not be doing the beer.  Pt reports he took Lipitor for a month Could not walk/pain in body so stopped  but willing now to start another cholesterol medication. Pt reports he saw Dr. [name] recently, MD aware he is not taking his medications.  Pt reports  On amputation of fifth toe- right foot healed.      Objective:  Vitals:   01/26/16 1211  BP: 122/78  Pulse: 82  Resp: 20      ROS  Physical Exam  Constitutional: He is oriented to person, place, and time. He appears well-developed and well-nourished.  Cardiovascular: Normal rate and regular rhythm.   Respiratory: Effort normal and breath sounds normal.  GI: Soft. Bowel sounds are normal.  Musculoskeletal: Normal range of motion. He exhibits no edema.  Neurological: He is alert and oriented to person, place, and time.  Skin: Skin is warm and dry.  Psychiatric: He has a normal mood and affect. His behavior is normal. Judgment and thought content normal.    Encounter Medications:   Outpatient Encounter Prescriptions as of 01/26/2016  Medication Sig Note  . albuterol (PROVENTIL HFA;VENTOLIN HFA) 108 (90 Base) MCG/ACT inhaler Inhale 2 puffs into the lungs every 6 (six) hours as needed for wheezing or shortness of breath. 01/26/2016: As needed.   Marland Kitchen aspirin EC 81 MG tablet Take 81 mg by mouth every other day. 01/26/2016: Pt taking two every 3 days    No facility-administered encounter medications on file as of 01/26/2016.     Functional Status:   In your present state of  health, do you have any difficulty performing the following activities: 01/26/2016 08/11/2015  Hearing? Y N  Vision? Y N  Difficulty concentrating or making decisions? Y N  Walking or climbing stairs? N Y  Dressing or bathing? N N  Doing errands, shopping? N N  Preparing Food and eating ? N -  Using the Toilet? N -  In the past six months, have you accidently leaked urine? Y -  Do you have problems with loss of bowel control? N -  Managing your Medications? N -  Managing your Finances? N -  Housekeeping or managing your Housekeeping? N -  Some recent data might be hidden    Fall/Depression Screening:    PHQ 2/9 Scores 01/19/2016 08/11/2015 08/30/2014  PHQ - 2 Score 1 0 0    Assessment:  Pleasant 70 year old gentleman, resides in boarding house and per pt's request did Home visit outdoors.  Pt a smoker.  Lungs clear, c/o pain in both ears (+10) use of vinegar helped.                          Hyperlipidemia: pt currently not taking any medication.   Emmi information provided,                            Reviewed with pt.  Pt willing to start on another cholesterol medication.                         Medication issues: pt taking old prescription of  Doxycycline on his own for his ears                              To which discussed with pt not taking any medications not prescribed by MD-                               Pt voiced understanding, to stop.                                 Per pt, stopped taking Plavix for 3-4 months, drinking beer in place of medication                                 But then started back taking Plavix yesterday, to stop beer.  RN CM discussed                                  With pt plan to call MD- inquire if he is to  continue to take Plavix with being off                                   So long.                                                Plan:  Plan to call Dr. Gaspar Cola office, inform MD pt willing to start on another cholesterol Medication, if okay  for pt to start back on Plavix.             Plan to send Dr. [name] by in basket 11/03 home visit encounter.             Plan to continue to follow pt with community nurse case management services, follow              Up again next month- home visit.    THN CM Care Plan Problem One   Flowsheet Row Most Recent Value  Care Plan Problem One  Hyperlipidemia- better self management   Role Documenting the Problem One  Care Management [place] for Problem One  Active  THN Long Term Goal (31-90 days)  Pt's management of Hyperlipidemia will improve in the next 60 days   THN Long Term Goal Start Date  01/26/16  Interventions for Problem One Long Term Goal  Provided pt with Emmi information on Hyperlipidemia, reviewed with pt.   THN CM Short Term Goal #1 (0-30 days)  Pt would start on cholesterol medication within the next 30 days   THN CM Short Term Goal #1 Start Date  01/26/16  Interventions for Short Term Goal #1  RN CM discussed with pt benefit of cholesterol medication to which he was willing to start a new  one, RN CM to call MD office   THN CM Short Term Goal #2 (0-30 days)  Pt would be compliant with a Low Fat diet for the next 30 days   THN CM Short Term Goal #2 Start Date  01/26/16  Interventions for Short Term Goal #2  Provided/reviewed Emmi information with pt on foods low in cholesterol.       Zara Chess.   [place] Care Management  [phone]

## 2016-01-29 ENCOUNTER — Encounter: Payer: Self-pay | Admitting: *Deleted

## 2016-01-29 ENCOUNTER — Telehealth: Payer: Self-pay

## 2016-01-29 ENCOUNTER — Other Ambulatory Visit: Payer: Self-pay | Admitting: *Deleted

## 2016-01-29 NOTE — Patient Outreach (Addendum)
RN CM called Dr. Gaspar Cola office, spoke with MD's  CMA Theresia Majors, relayed did a home visit with pt 11/03, pt reports willing to start on a different cholesterol medication, was taking Lipitor in the past stopped because of side effects.   Relayed  to Roselyn Reef that pt started back on Plavix 11/2, has not been taking for 3-4 months, this RN CM instructed pt  to hold until clarify with MD to restart (not on pt's medication profile).  Also relayed pt was taking  Doxycycline for his ears, an old prescription that he was given for right foot/fifth toe to which RN CM instructed him to stop/not to take any medication unless prescribed by MD.   Roselyn Reef reports she will relay information to MD and follow up with pt.   As requested, RN CM provided contact name and number to call if needed.       Plan:  This RN CM to follow up with pt next month- home visit.   Zara Chess.   [place] Care Management  [phone]

## 2016-01-29 NOTE — Telephone Encounter (Signed)
HH did visit and patient only taking aspirin and albuterol. Taking Doxycycline for ears  That he found around house. Started on his plavix on his own and agreed to start cholesterol med again but does not want Lipitor would like one that may not cause muscle aches. [place] Nurse wanted to alert [place].

## 2016-02-26 ENCOUNTER — Ambulatory Visit: Payer: Self-pay | Admitting: *Deleted

## 2016-02-26 ENCOUNTER — Other Ambulatory Visit: Payer: Self-pay | Admitting: *Deleted

## 2016-02-26 NOTE — Patient Outreach (Signed)
Attempt made to contact pt on home phone, verify living address.   phone showed user busy.  Note- RN CM called Dr. Gaspar Cola office, informed no other address provided other than mailing address, contact number (mobile number).      Plan:  RN CM to try again tomorrow   Zara Chess.   [place] Care Management  [phone]

## 2016-02-26 NOTE — Patient Outreach (Signed)
Attempt made to contact pt- home visit scheduled with pt today, wanted to verify home address as address listed is his mailing address.   Message on pt's phone- mail box not set up yet, try again later.    Plan to try again later.    Zara Chess.   [place] Care Management  [phone]

## 2016-02-27 ENCOUNTER — Other Ambulatory Visit: Payer: Self-pay | Admitting: *Deleted

## 2016-02-27 ENCOUNTER — Ambulatory Visit: Payer: Self-pay | Admitting: *Deleted

## 2016-02-27 NOTE — Patient Outreach (Signed)
Another attempt made to contact pt,verify home address, reschedule home visit.  Unable to leave a voice message as message on phone- voice message not set up yet, try and call later.   Plan: RN CM to call brother Hedy Camara on [facility] consent form.     Zara Chess.   [place] Care Management  [phone]

## 2016-02-27 NOTE — Patient Outreach (Signed)
Attempt made to contact pt's brother Howie Betton (on consent form) as numerous attempts were made to contact pt.  HIPAA compliant voice message left with contact number.    Plan: If no response, will make a final attempt to call pt again and if unable to contact will send unable to contact letter.   Zara Chess.   [place] Care Management  [phone]

## 2016-02-29 ENCOUNTER — Encounter: Payer: Self-pay | Admitting: *Deleted

## 2016-02-29 ENCOUNTER — Other Ambulatory Visit: Payer: Self-pay | Admitting: *Deleted

## 2016-02-29 NOTE — Patient Outreach (Signed)
Final attempt made to contact pt, verify address, reschedule home visit.   Unable to leave a voice message as message on phone- voice message not set up yet, try call later.     Plan:  Unable to contact letter to be sent to pt, if no response in 10 business days, to close case and notify MD.    Zara Chess.   [place] Care Management  [phone]

## 2016-03-05 ENCOUNTER — Ambulatory Visit (INDEPENDENT_AMBULATORY_CARE_PROVIDER_SITE_OTHER): Payer: Medicare PPO | Admitting: Internal Medicine

## 2016-03-05 ENCOUNTER — Encounter: Payer: Self-pay | Admitting: Internal Medicine

## 2016-03-05 VITALS — BP 126/88 | HR 88 | Temp 97.2°F | Ht 68.0 in | Wt 161.0 lb

## 2016-03-05 DIAGNOSIS — J431 Panlobular emphysema: Secondary | ICD-10-CM

## 2016-03-05 DIAGNOSIS — I739 Peripheral vascular disease, unspecified: Secondary | ICD-10-CM

## 2016-03-05 DIAGNOSIS — R079 Chest pain, unspecified: Secondary | ICD-10-CM | POA: Diagnosis not present

## 2016-03-05 MED ORDER — AMLODIPINE BESYLATE 5 MG PO TABS: 5.0000 mg | ORAL_TABLET | Freq: Every day | ORAL | 1 refills | Status: DC

## 2016-03-05 NOTE — Progress Notes (Signed)
Date:  03/05/2016   Name:  Brady Smith   DOB:  [birth year]   MRN:  [id number]   Chief Complaint: Chest Pain (Pt stated having uncomfortable feeling in chest and breathing problem.) Chest Pain   This is a chronic problem. The current episode started more than 1 month ago. The onset quality is undetermined. The problem occurs constantly. The problem has been unchanged. The pain is present in the substernal region. The pain is mild. The quality of the pain is described as pressure. The pain does not radiate. Pertinent negatives include no abdominal pain, cough, diaphoresis, dizziness, fever, headaches, palpitations, shortness of breath, vomiting or weakness. The pain is aggravated by nothing. He has tried rest (aspirin and inhaler) for the symptoms. Risk factors include smoking/tobacco exposure.  He stopped all his medication several months ago.  He still takes aspirin sometimes.  He has hx of PVD and toe amputations.  He continues to smoke.  He says he had a cardiac cath many years ago with no intervention.    Review of Systems  Constitutional: Negative for diaphoresis, fever and unexpected weight change.  Respiratory: Positive for chest tightness. Negative for cough, shortness of breath and wheezing.   Cardiovascular: Positive for chest pain. Negative for palpitations and leg swelling.  Gastrointestinal: Positive for abdominal distention. Negative for abdominal pain, diarrhea and vomiting.  Neurological: Negative for dizziness, tremors, syncope, weakness and headaches.  Psychiatric/Behavioral: Negative for sleep disturbance.    Patient Active Problem List   Diagnosis Date Noted  . S/P amputation of lesser toe ([place]) 07/25/2015  . Absence of toe ([place]) 07/25/2015  . Peripheral vascular disease ([place]) 07/14/2015  . AA (alcohol abuse) 05/23/2015  . Urinary retention 05/18/2015  . Choroidal nevus, right eye 02/08/2015  . Carpal tunnel syndrome 01/13/2015  . H/O gastrointestinal disease  01/13/2015  . History of primary malignant neoplasm of urinary bladder 01/13/2015  . Compulsive tobacco user syndrome 01/13/2015  . HLD (hyperlipidemia) 01/13/2015  . Neurosis, phobic 01/13/2015  . Chronic obstructive pulmonary disease ([place]) 06/11/2011  . Lung nodule, solitary 06/11/2011    Prior to Admission medications   Medication Sig Start Date End Date Taking? Authorizing Provider  aspirin EC 81 MG tablet Take 81 mg by mouth every other day.   Yes Historical Provider, MD  albuterol (PROVENTIL HFA;VENTOLIN HFA) 108 (90 Base) MCG/ACT inhaler Inhale 2 puffs into the lungs every 6 (six) hours as needed for wheezing or shortness of breath.    Historical Provider, MD    No Known Allergies  Past Surgical History:  Procedure Laterality Date  . AMPUTATION Right 07/19/2015   Procedure: AMPUTATION DIGIT ( RIGHT FOOT FIFTH TOE );  Surgeon: Algernon Huxley, MD;  Location: [facility] ORS;  Service: Vascular;  Laterality: Right;  . BOWEL RESECTION  05/20/2015   Procedure: SMALL BOWEL RESECTION;  Surgeon: Clayburn Pert, MD;  Location: [facility] ORS;  Service: General;;  . CHOLECYSTECTOMY    . COLONOSCOPY  2000  . CYSTECTOMY W/ CONTINENT DIVERSION  1996  . HERNIA REPAIR    . LAPAROTOMY N/A 05/20/2015   Procedure: EXPLORATORY LAPAROTOMY;  Surgeon: Clayburn Pert, MD;  Location: [facility] ORS;  Service: General;  Laterality: N/A;  . PERIPHERAL VASCULAR CATHETERIZATION N/A 07/03/2015   Procedure: Abdominal Aortogram w/Lower Extremity;  Surgeon: Algernon Huxley, MD;  Location: [place] [facility];  Service: Cardiovascular;  Laterality: N/A;  . PERIPHERAL VASCULAR CATHETERIZATION  07/03/2015   Procedure: Lower Extremity Intervention;  Surgeon: Algernon Huxley, MD;  Location: [place] [facility];  Service: Cardiovascular;;  . PROSTATECTOMY  1996  . TONSILLECTOMY      Social History  Substance Use Topics  . Smoking status: Current Every Day Smoker    Packs/day: 2.00    Years: 57.00    Types: Cigarettes  . Smokeless  tobacco: Never Used     Comment: patient not ready to quit  . Alcohol use 8.4 oz/week    14 Standard drinks or equivalent per week     Comment: quit a few weeks away, before then 2/day, 11/03- 40 oz of beer a day      Medication list has been reviewed and updated.   Physical Exam  Constitutional: He is oriented to person, place, and time. He appears well-developed. No distress.  HENT:  Head: Normocephalic and atraumatic.  Cardiovascular: Normal rate, regular rhythm and normal heart sounds.   Pulmonary/Chest: Effort normal. No respiratory distress. He has decreased breath sounds. He has no wheezes. He has no rhonchi.  Abdominal:  Post surgical changes, abd wall bulges  Musculoskeletal: Normal range of motion.  Neurological: He is alert and oriented to person, place, and time.  Skin: Skin is warm and dry. No rash noted.  Psychiatric: He has a normal mood and affect. His speech is normal and behavior is normal. Thought content normal.  Nursing note and vitals reviewed.   BP 126/88   Pulse 88   Temp 97.2 F (36.2 C)   Ht 5\' 8"  (1.727 m)   Wt 161 lb (73 kg)   SpO2 97%   BMI 24.48 kg/m   Assessment and Plan: 1. Chest pain at rest Stable EKG - needs cardiology evaluation Instructed to go to ER if pain worsens - EKG 12-Lead - Ambulatory referral to Cardiology - amLODipine (NORVASC) 5 MG tablet; Take 1 tablet (5 mg total) by mouth daily.  Dispense: 30 tablet; Refill: 1  2. Panlobular emphysema (HCC) Continues to smoke with no interest in quitting  3. Peripheral vascular disease ([place]) Continue aspirin daily   Halina Maidens, MD [facility]  03/05/2016

## 2016-03-06 DIAGNOSIS — R0789 Other chest pain: Secondary | ICD-10-CM | POA: Diagnosis not present

## 2016-03-06 DIAGNOSIS — E7801 Familial hypercholesterolemia: Secondary | ICD-10-CM | POA: Diagnosis not present

## 2016-03-06 DIAGNOSIS — I739 Peripheral vascular disease, unspecified: Secondary | ICD-10-CM | POA: Diagnosis not present

## 2016-03-06 DIAGNOSIS — R0602 Shortness of breath: Secondary | ICD-10-CM | POA: Diagnosis not present

## 2016-03-06 DIAGNOSIS — I10 Essential (primary) hypertension: Secondary | ICD-10-CM | POA: Diagnosis not present

## 2016-03-15 ENCOUNTER — Encounter: Payer: Self-pay | Admitting: *Deleted

## 2016-04-02 ENCOUNTER — Other Ambulatory Visit: Payer: Self-pay | Admitting: Pharmacist

## 2016-04-02 NOTE — Patient Outreach (Signed)
[place] [facility]) Care Management  04/02/2016  Brady Smith [birth year] [id number]  Patient referred to [place] by [place].  First unsuccessful phone outreach attempt.   Unable to leave message, patient phone gave recording voicemail not set up.   Of note, [facility] RN Community has been unsuccessful reaching patient as well.   Plan:  Will make second outreach attempt in the next week.   Karrie Meres, PharmD, [place] [phone]

## 2016-04-04 ENCOUNTER — Other Ambulatory Visit: Payer: Self-pay | Admitting: Pharmacist

## 2016-04-04 NOTE — Patient Outreach (Signed)
[facility]) Care Management  04/04/2016  Brady Smith March 28, 1945 [id number]  Second outreach attempt by [facility] Pharmacist successful.   Patient verified name and birthday but did not wish to provide address.   Patient referred to [place] by [place] due to medication non-adherence.   [place] closed patient's case due to inability to maintain contact with patient.   Today patient states that he does not have any medication related questions/concerns.  Patient did not wish to review medications with [facility] Pharmacist.   Patient was asked multiple times if he had any medication questions/concerns and each time he declined.   Plan:  Will close this patient's case due to patient refusal.   Karrie Meres, PharmD, [place] [phone]

## 2016-09-26 DIAGNOSIS — Z8551 Personal history of malignant neoplasm of bladder: Secondary | ICD-10-CM | POA: Diagnosis not present

## 2016-09-26 DIAGNOSIS — F17200 Nicotine dependence, unspecified, uncomplicated: Secondary | ICD-10-CM | POA: Diagnosis not present

## 2016-09-26 DIAGNOSIS — Z6825 Body mass index (BMI) 25.0-25.9, adult: Secondary | ICD-10-CM | POA: Diagnosis not present

## 2016-09-26 DIAGNOSIS — H9190 Unspecified hearing loss, unspecified ear: Secondary | ICD-10-CM | POA: Diagnosis not present

## 2016-09-26 DIAGNOSIS — J449 Chronic obstructive pulmonary disease, unspecified: Secondary | ICD-10-CM | POA: Diagnosis not present

## 2016-09-26 DIAGNOSIS — Z89429 Acquired absence of other toe(s), unspecified side: Secondary | ICD-10-CM | POA: Diagnosis not present

## 2016-11-29 ENCOUNTER — Ambulatory Visit (INDEPENDENT_AMBULATORY_CARE_PROVIDER_SITE_OTHER): Payer: Medicare PPO | Admitting: Internal Medicine

## 2016-11-29 ENCOUNTER — Encounter: Payer: Self-pay | Admitting: Internal Medicine

## 2016-11-29 VITALS — BP 124/78 | HR 87 | Ht 68.0 in | Wt 178.4 lb

## 2016-11-29 DIAGNOSIS — I739 Peripheral vascular disease, unspecified: Secondary | ICD-10-CM | POA: Diagnosis not present

## 2016-11-29 DIAGNOSIS — J449 Chronic obstructive pulmonary disease, unspecified: Secondary | ICD-10-CM | POA: Diagnosis not present

## 2016-11-29 DIAGNOSIS — J431 Panlobular emphysema: Secondary | ICD-10-CM | POA: Diagnosis not present

## 2016-11-29 DIAGNOSIS — E785 Hyperlipidemia, unspecified: Secondary | ICD-10-CM | POA: Diagnosis not present

## 2016-11-29 DIAGNOSIS — R7309 Other abnormal glucose: Secondary | ICD-10-CM | POA: Diagnosis not present

## 2016-11-29 NOTE — Progress Notes (Signed)
Date:  11/29/2016   Name:  Brady Smith   DOB:  May 19, 1945   MRN:  [id number]   Chief Complaint: COPD (Wants to discuss inhalers. Needs refill - been using expired one. Dr Jeremy Johann stated he has CHF.  ) COPD - having nocturnal episodes of SOB that wake him from sleep.  He feels like he is in critical condition but feels better after inhaler.  He has several inhalers - probably all albuterol.   Review of Systems  Constitutional: Negative for chills, fatigue and fever.  Respiratory: Positive for chest tightness, shortness of breath and wheezing. Negative for cough and choking.   Cardiovascular: Negative for chest pain and palpitations.  Skin: Negative for rash and wound.  Psychiatric/Behavioral: Negative for sleep disturbance.    Patient Active Problem List   Diagnosis Date Noted  . S/P amputation of lesser toe ([place]) 07/25/2015  . Absence of toe ([place]) 07/25/2015  . Peripheral vascular disease ([place]) 07/14/2015  . AA (alcohol abuse) 05/23/2015  . Urinary retention 05/18/2015  . Choroidal nevus, right eye 02/08/2015  . Carpal tunnel syndrome 01/13/2015  . H/O gastrointestinal disease 01/13/2015  . History of primary malignant neoplasm of urinary bladder 01/13/2015  . Compulsive tobacco user syndrome 01/13/2015  . HLD (hyperlipidemia) 01/13/2015  . Neurosis, phobic 01/13/2015  . Chronic obstructive pulmonary disease ([place]) 06/11/2011  . Lung nodule, solitary 06/11/2011    Prior to Admission medications   Medication Sig Start Date End Date Taking? Authorizing Provider  albuterol (PROVENTIL HFA;VENTOLIN HFA) 108 (90 Base) MCG/ACT inhaler Inhale 2 puffs into the lungs every 6 (six) hours as needed for wheezing or shortness of breath.   Yes [provider]  aspirin EC 81 MG tablet Take 81 mg by mouth every other day.   Yes [provider]    No Known Allergies  Past Surgical History:  Procedure Laterality Date  . AMPUTATION Right 07/19/2015   Procedure: AMPUTATION  DIGIT ( RIGHT FOOT FIFTH TOE );  Surgeon: Algernon Huxley, MD;  Location: [facility];  Service: Vascular;  Laterality: Right;  . BOWEL RESECTION  05/20/2015   Procedure: SMALL BOWEL RESECTION;  Surgeon: Clayburn Pert, MD;  Location: [facility];  Service: General;;  . CHOLECYSTECTOMY    . COLONOSCOPY  2000  . CYSTECTOMY W/ CONTINENT DIVERSION  1996  . HERNIA REPAIR    . LAPAROTOMY N/A 05/20/2015   Procedure: EXPLORATORY LAPAROTOMY;  Surgeon: Clayburn Pert, MD;  Location: [facility];  Service: General;  Laterality: N/A;  . PERIPHERAL VASCULAR CATHETERIZATION N/A 07/03/2015   Procedure: Abdominal Aortogram w/Lower Extremity;  Surgeon: Algernon Huxley, MD;  Location: [place] [facility];  Service: Cardiovascular;  Laterality: N/A;  . PERIPHERAL VASCULAR CATHETERIZATION  07/03/2015   Procedure: Lower Extremity Intervention;  Surgeon: Algernon Huxley, MD;  Location: [place] [facility];  Service: Cardiovascular;;  . PROSTATECTOMY  1996  . TONSILLECTOMY      Social History  Substance Use Topics  . Smoking status: Current Every Day Smoker    Packs/day: 2.00    Years: 57.00    Types: Cigarettes  . Smokeless tobacco: Never Used     Comment: patient not ready to quit  . Alcohol use 8.4 oz/week    14 Standard drinks or equivalent per week     Comment: quit a few weeks away, before then 2/day, 11/03- 40 oz of beer a day      Medication list has been reviewed and updated.  PHQ 2/9  Scores 01/19/2016 08/11/2015 08/30/2014  PHQ - 2 Score 1 0 0    Physical Exam  Constitutional: He is oriented to person, place, and time. He appears well-developed. No distress.  HENT:  Head: Normocephalic and atraumatic.  Pulmonary/Chest: Effort normal. No respiratory distress. He has decreased breath sounds. He has no wheezes. He has no rhonchi.  Musculoskeletal: Normal range of motion.  Neurological: He is alert and oriented to person, place, and time.  Skin: Skin is warm and dry. No rash noted.  Psychiatric: He has a normal  mood and affect. His behavior is normal. Thought content normal.  Nursing note and vitals reviewed.   BP 124/78   Pulse 87   Ht 5\' 8"  (1.727 m)   Wt 178 lb 6.4 oz (80.9 kg)   SpO2 95%   BMI 27.13 kg/m   Assessment and Plan: 1. Panlobular emphysema ([place]) Need to start maintenance - he says he has Advair at home Can still use the rescue inhaler He declines flu vaccine  2. Peripheral vascular disease ([place]) stable   No orders of the defined types were placed in this encounter.   Partially dictated using Editor, commissioning. Any errors are unintentional.  Halina Maidens, MD [facility]  11/29/2016

## 2016-11-29 NOTE — Patient Instructions (Signed)
You Need to use a Maintenance Inhaler EVERY DAY.   The purple round one is good - take one puff twice a day.  You can still use the yellow/red/blue one AS NEEDED for worsening shortness of breath.

## 2016-11-30 LAB — COMPREHENSIVE METABOLIC PANEL
A/G RATIO: 1.8 (ref 1.2–2.2)
ALT: 19 IU/L (ref 0–44)
AST: 16 IU/L (ref 0–40)
Albumin: 4.4 g/dL (ref 3.5–4.8)
Alkaline Phosphatase: 65 IU/L (ref 39–117)
BILIRUBIN TOTAL: 0.3 mg/dL (ref 0.0–1.2)
BUN/Creatinine Ratio: 19 (ref 10–24)
BUN: 21 mg/dL (ref 8–27)
CALCIUM: 9.7 mg/dL (ref 8.6–10.2)
CHLORIDE: 101 mmol/L (ref 96–106)
CO2: 20 mmol/L (ref 20–29)
Creatinine, Ser: 1.13 mg/dL (ref 0.76–1.27)
GFR calc non Af Amer: 65 mL/min/{1.73_m2} (ref >59)
GFR, EST AFRICAN AMERICAN: 75 mL/min/{1.73_m2} (ref >59)
GLOBULIN, TOTAL: 2.5 g/dL (ref 1.5–4.5)
Glucose: 99 mg/dL (ref 65–99)
POTASSIUM: 5 mmol/L (ref 3.5–5.2)
SODIUM: 140 mmol/L (ref 134–144)
TOTAL PROTEIN: 6.9 g/dL (ref 6.0–8.5)

## 2016-11-30 LAB — CBC WITH DIFFERENTIAL/PLATELET
BASOS: 0 %
Basophils Absolute: 0 10*3/uL (ref 0.0–0.2)
EOS (ABSOLUTE): 0.1 10*3/uL (ref 0.0–0.4)
Eos: 1 %
HEMATOCRIT: 47.6 % (ref 37.5–51.0)
Hemoglobin: 16 g/dL (ref 13.0–17.7)
IMMATURE GRANULOCYTES: 1 %
Immature Grans (Abs): 0.1 10*3/uL (ref 0.0–0.1)
LYMPHS: 21 %
Lymphocytes Absolute: 1.7 10*3/uL (ref 0.7–3.1)
MCH: 32.6 pg (ref 26.6–33.0)
MCHC: 33.6 g/dL (ref 31.5–35.7)
MCV: 97 fL (ref 79–97)
MONOS ABS: 0.8 10*3/uL (ref 0.1–0.9)
Monocytes: 10 %
NEUTROS ABS: 5.6 10*3/uL (ref 1.4–7.0)
NEUTROS PCT: 67 %
Platelets: 162 10*3/uL (ref 150–379)
RBC: 4.91 x10E6/uL (ref 4.14–5.80)
RDW: 13.9 % (ref 12.3–15.4)
WBC: 8.3 10*3/uL (ref 3.4–10.8)

## 2016-11-30 LAB — HEMOGLOBIN A1C
Est. average glucose Bld gHb Est-mCnc: 123 mg/dL
Hgb A1c MFr Bld: 5.9 % — ABNORMAL HIGH (ref 4.8–5.6)

## 2017-01-23 ENCOUNTER — Emergency Department: Payer: Medicare Other

## 2017-01-23 ENCOUNTER — Inpatient Hospital Stay
Admission: EM | Admit: 2017-01-23 | Discharge: 2017-01-24 | DRG: 988 | Disposition: A | Discharge status: Home / Self Care | Payer: Medicare Other | Attending: Internal Medicine | Admitting: Internal Medicine

## 2017-01-23 ENCOUNTER — Encounter: Payer: Self-pay | Admitting: Emergency Medicine

## 2017-01-23 ENCOUNTER — Encounter: Payer: Self-pay | Admitting: Internal Medicine

## 2017-01-23 ENCOUNTER — Ambulatory Visit (INDEPENDENT_AMBULATORY_CARE_PROVIDER_SITE_OTHER): Payer: Medicare PPO | Admitting: Internal Medicine

## 2017-01-23 VITALS — BP 122/68 | HR 86 | Ht 68.0 in | Wt 184.0 lb

## 2017-01-23 DIAGNOSIS — J441 Chronic obstructive pulmonary disease with (acute) exacerbation: Secondary | ICD-10-CM | POA: Diagnosis present

## 2017-01-23 DIAGNOSIS — R000 Tachycardia, unspecified: Secondary | ICD-10-CM | POA: Diagnosis not present

## 2017-01-23 DIAGNOSIS — Z85528 Personal history of other malignant neoplasm of kidney: Secondary | ICD-10-CM

## 2017-01-23 DIAGNOSIS — Z825 Family history of asthma and other chronic lower respiratory diseases: Secondary | ICD-10-CM

## 2017-01-23 DIAGNOSIS — R0602 Shortness of breath: Secondary | ICD-10-CM

## 2017-01-23 DIAGNOSIS — J209 Acute bronchitis, unspecified: Secondary | ICD-10-CM | POA: Diagnosis not present

## 2017-01-23 DIAGNOSIS — K5900 Constipation, unspecified: Secondary | ICD-10-CM

## 2017-01-23 DIAGNOSIS — Z9079 Acquired absence of other genital organ(s): Secondary | ICD-10-CM

## 2017-01-23 DIAGNOSIS — Z9889 Other specified postprocedural states: Secondary | ICD-10-CM | POA: Diagnosis not present

## 2017-01-23 DIAGNOSIS — J9600 Acute respiratory failure, unspecified whether with hypoxia or hypercapnia: Secondary | ICD-10-CM | POA: Diagnosis present

## 2017-01-23 DIAGNOSIS — R0600 Dyspnea, unspecified: Secondary | ICD-10-CM | POA: Diagnosis not present

## 2017-01-23 DIAGNOSIS — J440 Chronic obstructive pulmonary disease with acute lower respiratory infection: Secondary | ICD-10-CM | POA: Diagnosis not present

## 2017-01-23 DIAGNOSIS — J9601 Acute respiratory failure with hypoxia: Principal | ICD-10-CM | POA: Diagnosis present

## 2017-01-23 DIAGNOSIS — F17210 Nicotine dependence, cigarettes, uncomplicated: Secondary | ICD-10-CM | POA: Diagnosis not present

## 2017-01-23 DIAGNOSIS — Z8249 Family history of ischemic heart disease and other diseases of the circulatory system: Secondary | ICD-10-CM

## 2017-01-23 DIAGNOSIS — K219 Gastro-esophageal reflux disease without esophagitis: Secondary | ICD-10-CM | POA: Diagnosis not present

## 2017-01-23 DIAGNOSIS — Z7982 Long term (current) use of aspirin: Secondary | ICD-10-CM | POA: Diagnosis not present

## 2017-01-23 DIAGNOSIS — Z8551 Personal history of malignant neoplasm of bladder: Secondary | ICD-10-CM

## 2017-01-23 DIAGNOSIS — I739 Peripheral vascular disease, unspecified: Secondary | ICD-10-CM | POA: Diagnosis present

## 2017-01-23 DIAGNOSIS — J431 Panlobular emphysema: Secondary | ICD-10-CM | POA: Diagnosis not present

## 2017-01-23 DIAGNOSIS — Z720 Tobacco use: Secondary | ICD-10-CM | POA: Diagnosis not present

## 2017-01-23 DIAGNOSIS — F1010 Alcohol abuse, uncomplicated: Secondary | ICD-10-CM | POA: Diagnosis not present

## 2017-01-23 DIAGNOSIS — Z23 Encounter for immunization: Secondary | ICD-10-CM

## 2017-01-23 DIAGNOSIS — Z9049 Acquired absence of other specified parts of digestive tract: Secondary | ICD-10-CM

## 2017-01-23 LAB — CBC WITH DIFFERENTIAL/PLATELET
Basophils Absolute: 0 10*3/uL (ref 0–0.1)
Basophils Relative: 0 %
EOS ABS: 0 10*3/uL (ref 0–0.7)
EOS PCT: 0 %
HCT: 48.3 % (ref 40.0–52.0)
Hemoglobin: 16.3 g/dL (ref 13.0–18.0)
LYMPHS ABS: 0.2 10*3/uL — AB (ref 1.0–3.6)
LYMPHS PCT: 4 %
MCH: 32.8 pg (ref 26.0–34.0)
MCHC: 33.8 g/dL (ref 32.0–36.0)
MCV: 97.1 fL (ref 80.0–100.0)
MONO ABS: 0.1 10*3/uL — AB (ref 0.2–1.0)
MONOS PCT: 1 %
Neutro Abs: 5.8 10*3/uL (ref 1.4–6.5)
Neutrophils Relative %: 95 %
PLATELETS: 156 10*3/uL (ref 150–440)
RBC: 4.98 MIL/uL (ref 4.40–5.90)
RDW: 13.6 % (ref 11.5–14.5)
WBC: 6.1 10*3/uL (ref 3.8–10.6)

## 2017-01-23 LAB — BASIC METABOLIC PANEL
Anion gap: 10 (ref 5–15)
BUN: 15 mg/dL (ref 6–20)
CO2: 24 mmol/L (ref 22–32)
CREATININE: 1.16 mg/dL (ref 0.61–1.24)
Calcium: 9.2 mg/dL (ref 8.9–10.3)
Chloride: 105 mmol/L (ref 101–111)
GFR calc Af Amer: >60 mL/min (ref >60)
Glucose, Bld: 115 mg/dL — ABNORMAL HIGH (ref 65–99)
POTASSIUM: 4.2 mmol/L (ref 3.5–5.1)
Sodium: 139 mmol/L (ref 135–145)

## 2017-01-23 LAB — TROPONIN I: Troponin I: <0.03 ng/mL (ref <0.03)

## 2017-01-23 MED ORDER — SODIUM CHLORIDE 0.9 % IV BOLUS (SEPSIS)
1000.0000 mL | Freq: Once | INTRAVENOUS | Status: AC
Start: 2017-01-23 — End: 2017-01-23
  Administered 2017-01-23: 1000 mL via INTRAVENOUS

## 2017-01-23 MED ORDER — LEVOFLOXACIN 500 MG PO TABS
500.0000 mg | ORAL_TABLET | Freq: Every day | ORAL | Status: DC
  Administered 2017-01-23 – 2017-01-24 (×2): 500 mg via ORAL
  Filled 2017-01-23 (×2): qty 1

## 2017-01-23 MED ORDER — ENOXAPARIN SODIUM 40 MG/0.4ML ~~LOC~~ SOLN
40.0000 mg | SUBCUTANEOUS | Status: DC
  Administered 2017-01-23: 40 mg via SUBCUTANEOUS
  Filled 2017-01-23: qty 0.4

## 2017-01-23 MED ORDER — IPRATROPIUM-ALBUTEROL 0.5-2.5 (3) MG/3ML IN SOLN
3.0000 mL | Freq: Four times a day (QID) | RESPIRATORY_TRACT | Status: DC
  Administered 2017-01-23 – 2017-01-24 (×3): 3 mL via RESPIRATORY_TRACT
  Filled 2017-01-23 (×4): qty 3

## 2017-01-23 MED ORDER — SODIUM CHLORIDE 0.9 % IV SOLN: 250.0000 mL | INTRAVENOUS | Status: DC | PRN

## 2017-01-23 MED ORDER — ONDANSETRON HCL 4 MG PO TABS: 4.0000 mg | ORAL_TABLET | Freq: Four times a day (QID) | ORAL | Status: DC | PRN

## 2017-01-23 MED ORDER — IPRATROPIUM-ALBUTEROL 0.5-2.5 (3) MG/3ML IN SOLN
3.0000 mL | Freq: Once | RESPIRATORY_TRACT | Status: AC
  Administered 2017-01-23: 3 mL via RESPIRATORY_TRACT
  Filled 2017-01-23: qty 3

## 2017-01-23 MED ORDER — METHYLPREDNISOLONE SODIUM SUCC 125 MG IJ SOLR
125.0000 mg | Freq: Once | INTRAMUSCULAR | Status: AC
  Administered 2017-01-23: 125 mg via INTRAVENOUS
  Filled 2017-01-23: qty 2

## 2017-01-23 MED ORDER — ONDANSETRON HCL 4 MG/2ML IJ SOLN: 4.0000 mg | Freq: Four times a day (QID) | INTRAMUSCULAR | Status: DC | PRN

## 2017-01-23 MED ORDER — ACETAMINOPHEN 325 MG PO TABS: 650.0000 mg | ORAL_TABLET | Freq: Four times a day (QID) | ORAL | Status: DC | PRN

## 2017-01-23 MED ORDER — THIAMINE HCL 100 MG/ML IJ SOLN
100.0000 mg | Freq: Every day | INTRAMUSCULAR | Status: DC
  Filled 2017-01-23: qty 2

## 2017-01-23 MED ORDER — SODIUM CHLORIDE 0.9% FLUSH: 3.0000 mL | INTRAVENOUS | Status: DC | PRN

## 2017-01-23 MED ORDER — NICOTINE 21 MG/24HR TD PT24
21.0000 mg | MEDICATED_PATCH | Freq: Every day | TRANSDERMAL | Status: DC
  Administered 2017-01-23 – 2017-01-24 (×2): 21 mg via TRANSDERMAL
  Filled 2017-01-23 (×2): qty 1

## 2017-01-23 MED ORDER — GUAIFENESIN ER 600 MG PO TB12
600.0000 mg | ORAL_TABLET | Freq: Two times a day (BID) | ORAL | Status: DC
  Administered 2017-01-23 – 2017-01-24 (×2): 600 mg via ORAL
  Filled 2017-01-23 (×2): qty 1

## 2017-01-23 MED ORDER — SODIUM CHLORIDE 0.9% FLUSH
3.0000 mL | Freq: Two times a day (BID) | INTRAVENOUS | Status: DC
  Administered 2017-01-23 – 2017-01-24 (×2): 3 mL via INTRAVENOUS

## 2017-01-23 MED ORDER — ADULT MULTIVITAMIN W/MINERALS CH
1.0000 | ORAL_TABLET | Freq: Every day | ORAL | Status: DC
  Administered 2017-01-23 – 2017-01-24 (×2): 1 via ORAL
  Filled 2017-01-23 (×2): qty 1

## 2017-01-23 MED ORDER — PNEUMOCOCCAL VAC POLYVALENT 25 MCG/0.5ML IJ INJ
0.5000 mL | INJECTION | INTRAMUSCULAR | Status: AC
  Administered 2017-01-24: 0.5 mL via INTRAMUSCULAR
  Filled 2017-01-23: qty 0.5

## 2017-01-23 MED ORDER — FOLIC ACID 1 MG PO TABS
1.0000 mg | ORAL_TABLET | Freq: Every day | ORAL | Status: DC
  Administered 2017-01-23 – 2017-01-24 (×2): 1 mg via ORAL
  Filled 2017-01-23 (×2): qty 1

## 2017-01-23 MED ORDER — LORAZEPAM 1 MG PO TABS
1.0000 mg | ORAL_TABLET | Freq: Four times a day (QID) | ORAL | Status: DC | PRN
  Administered 2017-01-24: 1 mg via ORAL
  Filled 2017-01-23: qty 1

## 2017-01-23 MED ORDER — LORAZEPAM 2 MG/ML IJ SOLN: 1.0000 mg | Freq: Four times a day (QID) | INTRAMUSCULAR | Status: DC | PRN

## 2017-01-23 MED ORDER — ALBUTEROL SULFATE (2.5 MG/3ML) 0.083% IN NEBU
10.0000 mg | INHALATION_SOLUTION | Freq: Once | RESPIRATORY_TRACT | Status: AC
  Administered 2017-01-23: 10 mg via RESPIRATORY_TRACT
  Filled 2017-01-23: qty 12

## 2017-01-23 MED ORDER — ASPIRIN EC 81 MG PO TBEC
81.0000 mg | DELAYED_RELEASE_TABLET | ORAL | Status: DC
  Administered 2017-01-23: 81 mg via ORAL
  Filled 2017-01-23 (×2): qty 1

## 2017-01-23 MED ORDER — METHYLPREDNISOLONE SODIUM SUCC 125 MG IJ SOLR
60.0000 mg | Freq: Four times a day (QID) | INTRAMUSCULAR | Status: DC
  Administered 2017-01-23 – 2017-01-24 (×2): 60 mg via INTRAVENOUS
  Filled 2017-01-23 (×2): qty 2

## 2017-01-23 MED ORDER — ACETAMINOPHEN 650 MG RE SUPP: 650.0000 mg | Freq: Four times a day (QID) | RECTAL | Status: DC | PRN

## 2017-01-23 MED ORDER — VITAMIN B-1 100 MG PO TABS
100.0000 mg | ORAL_TABLET | Freq: Every day | ORAL | Status: DC
  Administered 2017-01-23 – 2017-01-24 (×2): 100 mg via ORAL
  Filled 2017-01-23 (×2): qty 1

## 2017-01-23 NOTE — Patient Instructions (Signed)
Inhaler instructions:  Symbicort 160/4.5 (red inhaler with counter on the top)   One or two puffs twice a day.    When symbicort is used up start Advair  Advair 230/21 (purple) One puff twice a day  When Advair and Symbicort are used up - call me for a refill of whichever one you like the best.  You may use the Red Proair inhaler or the Gray Albuterol - 2 puffs every 4 hours as needed for shortness of breath

## 2017-01-23 NOTE — ED Triage Notes (Signed)
Pt got a flu shot and is anxious that his sob is from the flu shot. Has chronic pain and sob from his copd and smoking 2 ppd. Pt angry and upset that no one is curing his sob x years. Yelling in the hallway.

## 2017-01-23 NOTE — ED Notes (Signed)
Pt moved from the hallway to room 17

## 2017-01-23 NOTE — H&P (Signed)
[place] [facility] [place] NAME: Brady Smith    MR#:  [id number]  DATE OF BIRTH:  Nov 16, 1945  DATE OF ADMISSION:  01/23/2017  PRIMARY CARE PHYSICIAN: Glean Hess, MD   REQUESTING/REFERRING PHYSICIAN: Veronese,Carolyn MD  CHIEF COMPLAINT:   Chief Complaint  Patient presents with  . Shortness of Breath    HISTORY OF PRESENT ILLNESS: Brady Smith  is a 71 y.o. male with a known history of anxiety, COPD, depression, GERD, peripheral vascular disease, history of kidney cancer who states that he's had shortness of breath progressive over the past one year. However over the past few days his shortness of breath has gotten worse. Patient was very tachycardic when he arrived to the ED and also tachycardic and shortness of breath. Patient's evaluation in the ED showed no evidence of pneumonia. He complains white color cough. No fevers or chills.  PAST MEDICAL HISTORY:   Past Medical History:  Diagnosis Date  . Anxiety   . Bladder cancer ([place])   . COPD (chronic obstructive pulmonary disease) ([place])   . Depression   . GERD (gastroesophageal reflux disease)   . History of kidney cancer   . Peripheral vascular disease ([place])   . Pneumonia     PAST SURGICAL HISTORY: Past Surgical History:  Procedure Laterality Date  . AMPUTATION Right 07/19/2015   Procedure: AMPUTATION DIGIT ( RIGHT FOOT FIFTH TOE );  Surgeon: Algernon Huxley, MD;  Location: [facility];  Service: Vascular;  Laterality: Right;  . BOWEL RESECTION  05/20/2015   Procedure: SMALL BOWEL RESECTION;  Surgeon: Clayburn Pert, MD;  Location: [facility];  Service: General;;  . CHOLECYSTECTOMY    . COLONOSCOPY  2000  . CYSTECTOMY W/ CONTINENT DIVERSION  1996  . HERNIA REPAIR    . LAPAROTOMY N/A 05/20/2015   Procedure: EXPLORATORY LAPAROTOMY;  Surgeon: Clayburn Pert, MD;  Location: [facility];  Service: General;  Laterality: N/A;  . PERIPHERAL VASCULAR CATHETERIZATION N/A 07/03/2015   Procedure: Abdominal  Aortogram w/Lower Extremity;  Surgeon: Algernon Huxley, MD;  Location: [place];  Service: Cardiovascular;  Laterality: N/A;  . PERIPHERAL VASCULAR CATHETERIZATION  07/03/2015   Procedure: Lower Extremity Intervention;  Surgeon: Algernon Huxley, MD;  Location: [place] [facility];  Service: Cardiovascular;;  . PROSTATECTOMY  1996  . TONSILLECTOMY      SOCIAL HISTORY:  Social History  Substance Use Topics  . Smoking status: Current Every Day Smoker    Packs/day: 2.00    Years: 57.00    Types: Cigarettes  . Smokeless tobacco: Never Used     Comment: patient not ready to quit  . Alcohol use 8.4 oz/week    14 Standard drinks or equivalent per week     Comment: quit a few weeks away, before then 2/day, 11/03- 40 oz of beer a day     FAMILY HISTORY:  Family History  Problem Relation Age of Onset  . Heart disease Mother   . Heart disease Father   . COPD Sister     DRUG ALLERGIES: No Known Allergies  REVIEW OF SYSTEMS:   CONSTITUTIONAL: No fever, fatigue or weakness.  EYES: No blurred or double vision.  EARS, NOSE, AND THROAT: No tinnitus or ear pain.  RESPIRATORY: No cough, positive  shortness of breath, positive  wheezing or hemoptysis.  CARDIOVASCULAR: No chest pain, orthopnea, edema.  GASTROINTESTINAL: No nausea, vomiting, diarrhea or abdominal pain.  GENITOURINARY: No dysuria, hematuria.  ENDOCRINE: No polyuria,  nocturia,  HEMATOLOGY: No anemia, easy bruising or bleeding SKIN: No rash or lesion. MUSCULOSKELETAL: No joint pain or arthritis.   NEUROLOGIC: No tingling, numbness, weakness.  PSYCHIATRY: No anxiety or depression.   MEDICATIONS AT HOME:  Prior to Admission medications   Medication Sig Start Date End Date Taking? Authorizing Provider  albuterol (PROVENTIL HFA;VENTOLIN HFA) 108 (90 Base) MCG/ACT inhaler Inhale 2 puffs into the lungs every 6 (six) hours as needed for wheezing or shortness of breath.    [provider]  aspirin EC 81 MG tablet Take  81 mg by mouth every other day.    [provider]      PHYSICAL EXAMINATION:   VITAL SIGNS: Blood pressure (!) 155/79, pulse (!) 120, resp. rate (!) 23, height 5\' 8"  (1.727 m), weight 184 lb (83.5 kg), SpO2 95 %.  GENERAL:  71 y.o.-year-old patient lying in the bed with no acute distress.  EYES: Pupils equal, round, reactive to light and accommodation. No scleral icterus. Extraocular muscles intact.  HEENT: Head atraumatic, normocephalic. Oropharynx and nasopharynx clear.  NECK:  Supple, no jugular venous distention. No thyroid enlargement, no tenderness.  LUNGS: Normal breath sounds bilaterally,  bilateral wheezing,  no rales,rhonchi or crepitation. + use of accessory muscles of respiration.  CARDIOVASCULAR: S1, S2 normal tachycardic . No murmurs, rubs, or gallops.  ABDOMEN: Soft, nontender, nondistended. Bowel sounds present. No organomegaly or mass.  EXTREMITIES: No pedal edema, cyanosis, or clubbing.  NEUROLOGIC: Cranial nerves II through XII are intact. Muscle strength 5/5 in all extremities. Sensation intact. Gait not checked.  PSYCHIATRIC: The patient is alert and oriented x 3.  SKIN: No obvious rash, lesion, or ulcer.   LABORATORY PANEL:   CBC  Recent Labs Lab 01/23/17 1527  WBC 6.1  HGB 16.3  HCT 48.3  PLT 156  MCV 97.1  MCH 32.8  MCHC 33.8  RDW 13.6  LYMPHSABS 0.2*  MONOABS 0.1*  EOSABS 0.0  BASOSABS 0.0   ------------------------------------------------------------------------------------------------------------------  Chemistries   Recent Labs Lab 01/23/17 1527  NA 139  K 4.2  CL 105  CO2 24  GLUCOSE 115*  BUN 15  CREATININE 1.16  CALCIUM 9.2   ------------------------------------------------------------------------------------------------------------------ estimated creatinine clearance is 61.5 mL/min (by C-G formula based on SCr of 1.16  mg/dL). ------------------------------------------------------------------------------------------------------------------ No results for input(s): TSH, T4TOTAL, T3FREE, THYROIDAB in the last 72 hours.  Invalid input(s): FREET3   Coagulation profile No results for input(s): INR, PROTIME in the last 168 hours. ------------------------------------------------------------------------------------------------------------------- No results for input(s): DDIMER in the last 72 hours. -------------------------------------------------------------------------------------------------------------------  Cardiac Enzymes  Recent Labs Lab 01/23/17 1527  TROPONINI <0.03   ------------------------------------------------------------------------------------------------------------------ Invalid input(s): POCBNP  ---------------------------------------------------------------------------------------------------------------  Urinalysis    Component Value Date/Time   COLORURINE YELLOW (A) 05/22/2015 0925   APPEARANCEUR CLEAR (A) 05/22/2015 0925   APPEARANCEUR Hazy 07/27/2013 1041   LABSPEC 1.015 05/22/2015 0925   LABSPEC 1.010 07/27/2013 1041   PHURINE 6.0 05/22/2015 0925   GLUCOSEU NEGATIVE 05/22/2015 0925   GLUCOSEU Negative 07/27/2013 1041   HGBUR 1+ (A) 05/22/2015 0925   BILIRUBINUR NEGATIVE 05/22/2015 0925   BILIRUBINUR Negative 07/27/2013 1041   KETONESUR TRACE (A) 05/22/2015 0925   PROTEINUR NEGATIVE 05/22/2015 0925   NITRITE NEGATIVE 05/22/2015 0925   LEUKOCYTESUR NEGATIVE 05/22/2015 0925   LEUKOCYTESUR Negative 07/27/2013 1041     RADIOLOGY: Dg Chest Port 1 View  Result Date: 01/23/2017 CLINICAL DATA:  71 y/o  M; shortness of breath. EXAM: PORTABLE CHEST 1 VIEW COMPARISON:  06/07/2015 chest radiograph. FINDINGS: Normal  cardiac silhouette. Aortic atherosclerosis with calcification. Clear lungs. No pleural effusion or pneumothorax. No acute osseous abnormality is evident.  IMPRESSION: No active disease. Electronically Signed   By: Kristine Garbe M.D.   On: 01/23/2017 16:34    EKG: Orders placed or performed during the hospital encounter of 01/23/17  . ED EKG  . ED EKG    IMPRESSION AND PLAN:  patient 71 year old COPD with worsening shortness of breath  1. Acute on chronic COPD exasperation Will treat patient with nebulizer therapy Place patient on IV Solu-Medrol Oral antibiotics Pulmicort nebs Mucinex  2. Daily alcohol use will place on ciwa protocol  3. Nicotine abuse smoking cessation provided 59min spent. Recommend he stop smoking his be started on nicotine patch  4. Sinus tachycardia monitor on telemetry  5. Miscellaneous Lovenox for DVT prophylaxis   All the records are reviewed and case discussed with ED provider. Management plans discussed with the patient, family and they are in agreement.  CODE STATUS: Code Status History    Date Active Date Inactive Code Status Order ID Comments User Context   05/18/2015  3:51 AM 06/08/2015 11:41 PM Full Code [id number]  Clayburn Pert, MD Inpatient       TOTAL TIME TAKING CARE OF THIS PATIENT: 55 minutes.    Dustin Flock M.D on 01/23/2017 at 6:21 PM  Between 7am to 6pm - Pager - [phone]  After 6pm go to [email or web address] - password [email or web address] [facility]  [facility]  Office  [phone]  CC: Primary care physician; Glean Hess, MD

## 2017-01-23 NOTE — ED Provider Notes (Signed)
[facility] Emergency Department Provider Note  ____________________________________________  Time seen: Approximately 3:27 PM  I have reviewed the triage vital signs and the nursing notes.   HISTORY  Chief Complaint Shortness of Breath   HPI Brady Smith is a 71 y.o. male the history of COPD, peripheral vascular disease, alcohol abuse, smoking who presents for shortness of breath. Patient reports progressively worsening shortness of breath for the last few days and severe today. Patient reports that he received a flu shot just prior to arrival to the emergency room and is concerned that this is the cause of the shortness of breath. He reports that he smokes 2-3 packs a day of cigarettes. Has been using his inhalers at home with no relief today. Has a cough productive of clear sputum, wheezing, severe cough and shortness of breath and chest tightness since this morning. No fever or chills, no nausea or vomiting. No personal or family history of blood clots, no hemoptysis.  Past Medical History:  Diagnosis Date  . Anxiety   . Bladder cancer ([place])   . COPD (chronic obstructive pulmonary disease) ([place])   . Depression   . GERD (gastroesophageal reflux disease)   . History of kidney cancer   . Peripheral vascular disease ([place])   . Pneumonia     Patient Active Problem List   Diagnosis Date Noted  . S/P amputation of lesser toe ([place]) 07/25/2015  . Absence of toe ([place]) 07/25/2015  . Peripheral vascular disease ([place]) 07/14/2015  . AA (alcohol abuse) 05/23/2015  . Urinary retention 05/18/2015  . Choroidal nevus, right eye 02/08/2015  . Carpal tunnel syndrome 01/13/2015  . H/O gastrointestinal disease 01/13/2015  . History of primary malignant neoplasm of urinary bladder 01/13/2015  . Compulsive tobacco user syndrome 01/13/2015  . HLD (hyperlipidemia) 01/13/2015  . Neurosis, phobic 01/13/2015  . Chronic obstructive pulmonary disease ([place]) 06/11/2011  .  Lung nodule, solitary 06/11/2011    Past Surgical History:  Procedure Laterality Date  . AMPUTATION Right 07/19/2015   Procedure: AMPUTATION DIGIT ( RIGHT FOOT FIFTH TOE );  Surgeon: Algernon Huxley, MD;  Location: [facility] ORS;  Service: Vascular;  Laterality: Right;  . BOWEL RESECTION  05/20/2015   Procedure: SMALL BOWEL RESECTION;  Surgeon: Clayburn Pert, MD;  Location: [facility] ORS;  Service: General;;  . CHOLECYSTECTOMY    . COLONOSCOPY  2000  . CYSTECTOMY W/ CONTINENT DIVERSION  1996  . HERNIA REPAIR    . LAPAROTOMY N/A 05/20/2015   Procedure: EXPLORATORY LAPAROTOMY;  Surgeon: Clayburn Pert, MD;  Location: [facility] ORS;  Service: General;  Laterality: N/A;  . PERIPHERAL VASCULAR CATHETERIZATION N/A 07/03/2015   Procedure: Abdominal Aortogram w/Lower Extremity;  Surgeon: Algernon Huxley, MD;  Location: [place] [facility];  Service: Cardiovascular;  Laterality: N/A;  . PERIPHERAL VASCULAR CATHETERIZATION  07/03/2015   Procedure: Lower Extremity Intervention;  Surgeon: Algernon Huxley, MD;  Location: [place] [facility];  Service: Cardiovascular;;  . PROSTATECTOMY  1996  . TONSILLECTOMY      Prior to Admission medications   Medication Sig Start Date End Date Taking? Authorizing Provider  albuterol (PROVENTIL HFA;VENTOLIN HFA) 108 (90 Base) MCG/ACT inhaler Inhale 2 puffs into the lungs every 6 (six) hours as needed for wheezing or shortness of breath.    [provider]  aspirin EC 81 MG tablet Take 81 mg by mouth every other day.    [provider]    Allergies Patient has no known allergies.  Family  History  Problem Relation Age of Onset  . Heart disease Mother   . Heart disease Father   . COPD Sister     Social History Social History  Substance Use Topics  . Smoking status: Current Every Day Smoker    Packs/day: 2.00    Years: 57.00    Types: Cigarettes  . Smokeless tobacco: Never Used     Comment: patient not ready to quit  . Alcohol use 8.4 oz/week    14 Standard  drinks or equivalent per week     Comment: quit a few weeks away, before then 2/day, 11/03- 40 oz of beer a day     Review of Systems  Constitutional: Negative for fever. Eyes: Negative for visual changes. ENT: Negative for sore throat. Neck: No neck pain  Cardiovascular: + chest tightness. Respiratory: + shortness of breath, wheezing, cough Gastrointestinal: Negative for abdominal pain, vomiting or diarrhea. Genitourinary: Negative for dysuria. Musculoskeletal: Negative for back pain. Skin: Negative for rash. Neurological: Negative for headaches, weakness or numbness. Psych: No SI or HI  ____________________________________________   PHYSICAL EXAM:  VITAL SIGNS: ED Triage Vitals  Enc Vitals Group     BP 01/23/17 1506 (!) 170/101     Pulse Rate 01/23/17 1506 (!) 101     Resp 01/23/17 1506 (!) 22     Temp --      Temp src --      SpO2 01/23/17 1506 93 %     Weight 01/23/17 1507 184 lb (83.5 kg)     Height 01/23/17 1507 5\' 8"  (1.727 m)     Head Circumference --      Peak Flow --      Pain Score 01/23/17 1504 10     Pain Loc --      Pain Edu? --      Excl. in [place]? --     Constitutional: Alert and oriented. Well appearing and in no apparent distress. HEENT:      Head: Normocephalic and atraumatic.         Eyes: Conjunctivae are normal. Sclera is non-icteric.       Mouth/Throat: Mucous membranes are moist.       Neck: Supple with no signs of meningismus. Cardiovascular: Tachycardic with regular rhythm. No murmurs, gallops, or rubs. 2+ symmetrical distal pulses are present in all extremities. No JVD. Respiratory: moderate respiratory distress, increased work of breathing, tripoding, tachypnea, hypoxic on room air, severely diminished air movement bilaterally with no crackles or wheezes. Gastrointestinal: Soft, non tender, and non distended with positive bowel sounds. No rebound or guarding. Musculoskeletal: Nontender with normal range of motion in all extremities. No  edema, cyanosis, or erythema of extremities. Neurologic: Normal speech and language. Face is symmetric. Moving all extremities. No gross focal neurologic deficits are appreciated. Skin: Skin is warm, dry and intact. No rash noted. Psychiatric: Mood and affect are normal. Speech and behavior are normal.  ____________________________________________   LABS (all labs ordered are listed, but only abnormal results are displayed)  Labs Reviewed  CBC WITH DIFFERENTIAL/PLATELET - Abnormal; Notable for the following:       Result Value   Lymphs Abs 0.2 (*)    Monocytes Absolute 0.1 (*)    All other components within normal limits  BASIC METABOLIC PANEL - Abnormal; Notable for the following:    Glucose, Bld 115 (*)    All other components within normal limits  TROPONIN I  BLOOD GAS, VENOUS   ____________________________________________  EKG  ED ECG REPORT I, Rudene Re, the attending physician, personally viewed and interpreted this ECG.  Sinus tachycardia, rate of 119, normal intervals, right axis deviation, no ST elevations or depressions. unchanged from prior from 2017 ____________________________________________  RADIOLOGY  CXR: negative  ____________________________________________   PROCEDURES  Procedure(s) performed: None Procedures Critical Care performed: yes  CRITICAL CARE Performed by: Rudene Re  ?  Total critical care time: 40 min  Critical care time was exclusive of separately billable procedures and treating other patients.  Critical care was necessary to treat or prevent imminent or life-threatening deterioration.  Critical care was time spent personally by me on the following activities: development of treatment plan with patient and/or surrogate as well as nursing, discussions with consultants, evaluation of patient's response to treatment, examination of patient, obtaining history from patient or surrogate, ordering and performing  treatments and interventions, ordering and review of laboratory studies, ordering and review of radiographic studies, pulse oximetry and re-evaluation of patient's condition.  ____________________________________________   INITIAL IMPRESSION / ASSESSMENT AND PLAN / ED COURSE   71 y.o. male the history of COPD, peripheral vascular disease, alcohol abuse, smoking who presents for shortness of breath, cough and wheezing. patient is in moderate respiratory distress, tripoding, severely diminished air movement bilaterally, no crackles or wheezing, hypoxic on room air. Will start patient on DuoNeb x 3. I have spoken with the charge nurse to try to move the patient from the hallway bed to a room so we can put him on BiPAP. We'll give steroids. Chest x-ray and labs are pending. EKG is pending.    _________________________ 5:14 PM on 01/23/2017 -----------------------------------------  patient with improved work of breathing is still very tight and not moving air still requiring 3 L of oxygen for sats in the mid 90s. We'll put patient on 10 mg of continuous albuterol and admitted to the hospitalist service. We'll give IV fluids for tachycardia. Chest x-ray with no evidence of pneumonia, no white count and no fever.   As part of my medical decision making, I reviewed the following data within the [place] notes reviewed and incorporated, Labs reviewed , EKG interpreted , Old EKG reviewed, Old chart reviewed, Radiograph reviewed , Discussed with admitting physician , Notes from prior ED visits and [place] Controlled Substance Database    Pertinent labs & imaging results that were available during my care of the patient were reviewed by me and considered in my medical decision making (see chart for details).    ____________________________________________   FINAL CLINICAL IMPRESSION(S) / ED DIAGNOSES  Final diagnoses:  COPD exacerbation ([place])  Acute respiratory failure with  hypoxia ([place])      NEW MEDICATIONS STARTED DURING THIS VISIT:  New Prescriptions   No medications on file     Note:  This document was prepared using Dragon voice recognition software and may include unintentional dictation errors.    Rudene Re, MD 01/23/17 [phone]

## 2017-01-23 NOTE — ED Notes (Signed)
Pt very upset stating he wants the police to come in and search him. When asked why he states to prove he doesn't have a weapon and how ridiculous it is that he was asked. Again advised that this is a question we ask everyone.

## 2017-01-23 NOTE — ED Notes (Signed)
Nebulizer treatments complete.

## 2017-01-23 NOTE — Progress Notes (Signed)
Date:  01/23/2017   Name:  Brady Smith   DOB:  1945-06-08   MRN:  [id number]   Chief Complaint: Emphysema (Follow up. Using advair inhaler. )  COPD - stable symptoms.  Using inhalers of various sorts that he got from a friend who passed away.  No worsening symptoms recently.  He has not had his flu shot.  Review of Systems  Constitutional: Negative for chills, fatigue and fever.  Eyes: Negative for visual disturbance.  Respiratory: Positive for cough, shortness of breath and wheezing. Negative for chest tightness.   Cardiovascular: Negative for chest pain and palpitations.  Gastrointestinal: Positive for abdominal distention.    Patient Active Problem List   Diagnosis Date Noted  . S/P amputation of lesser toe ([place]) 07/25/2015  . Absence of toe ([place]) 07/25/2015  . Peripheral vascular disease ([place]) 07/14/2015  . AA (alcohol abuse) 05/23/2015  . Urinary retention 05/18/2015  . Choroidal nevus, right eye 02/08/2015  . Carpal tunnel syndrome 01/13/2015  . H/O gastrointestinal disease 01/13/2015  . History of primary malignant neoplasm of urinary bladder 01/13/2015  . Compulsive tobacco user syndrome 01/13/2015  . HLD (hyperlipidemia) 01/13/2015  . Neurosis, phobic 01/13/2015  . Chronic obstructive pulmonary disease ([place]) 06/11/2011  . Lung nodule, solitary 06/11/2011    Prior to Admission medications   Medication Sig Start Date End Date Taking? Authorizing Provider  albuterol (PROVENTIL HFA;VENTOLIN HFA) 108 (90 Base) MCG/ACT inhaler Inhale 2 puffs into the lungs every 6 (six) hours as needed for wheezing or shortness of breath.    [provider]  aspirin EC 81 MG tablet Take 81 mg by mouth every other day.    [provider]    No Known Allergies  Past Surgical History:  Procedure Laterality Date  . AMPUTATION Right 07/19/2015   Procedure: AMPUTATION DIGIT ( RIGHT FOOT FIFTH TOE );  Surgeon: Algernon Huxley, MD;  Location: [facility] ORS;  Service: Vascular;   Laterality: Right;  . BOWEL RESECTION  05/20/2015   Procedure: SMALL BOWEL RESECTION;  Surgeon: Clayburn Pert, MD;  Location: [facility] ORS;  Service: General;;  . CHOLECYSTECTOMY    . COLONOSCOPY  2000  . CYSTECTOMY W/ CONTINENT DIVERSION  1996  . HERNIA REPAIR    . LAPAROTOMY N/A 05/20/2015   Procedure: EXPLORATORY LAPAROTOMY;  Surgeon: Clayburn Pert, MD;  Location: [facility] ORS;  Service: General;  Laterality: N/A;  . PERIPHERAL VASCULAR CATHETERIZATION N/A 07/03/2015   Procedure: Abdominal Aortogram w/Lower Extremity;  Surgeon: Algernon Huxley, MD;  Location: [place] [facility];  Service: Cardiovascular;  Laterality: N/A;  . PERIPHERAL VASCULAR CATHETERIZATION  07/03/2015   Procedure: Lower Extremity Intervention;  Surgeon: Algernon Huxley, MD;  Location: [place] [facility];  Service: Cardiovascular;;  . PROSTATECTOMY  1996  . TONSILLECTOMY      Social History  Substance Use Topics  . Smoking status: Current Every Day Smoker    Packs/day: 2.00    Years: 57.00    Types: Cigarettes  . Smokeless tobacco: Never Used     Comment: patient not ready to quit  . Alcohol use 8.4 oz/week    14 Standard drinks or equivalent per week     Comment: quit a few weeks away, before then 2/day, 11/03- 40 oz of beer a day      Medication list has been reviewed and updated.  PHQ 2/9 Scores 01/23/2017 01/19/2016 08/11/2015 08/30/2014  PHQ - 2 Score 0 1 0 0    Physical Exam  Cardiovascular: Normal rate, regular rhythm and normal heart sounds.   Pulmonary/Chest: Effort normal. He has decreased breath sounds. He has no wheezes. He has no rhonchi.  Psychiatric: He has a normal mood and affect. His speech is normal.    BP 122/68   Pulse 86   Ht 5\' 8"  (1.727 m)   Wt 184 lb (83.5 kg)   SpO2 96%   BMI 27.98 kg/m   Assessment and Plan: 1. Panlobular emphysema ([place]) Instructions for Symbicort the Advair given - call for refill on one of them when needed Continue Albuterol PRN  2. Need for influenza  vaccination - Flu vaccine HIGH DOSE PF   No orders of the defined types were placed in this encounter.   Partially dictated using Editor, commissioning. Any errors are unintentional.  Halina Maidens, MD [facility]  01/23/2017

## 2017-01-24 DIAGNOSIS — J440 Chronic obstructive pulmonary disease with acute lower respiratory infection: Secondary | ICD-10-CM | POA: Diagnosis not present

## 2017-01-24 DIAGNOSIS — J9601 Acute respiratory failure with hypoxia: Secondary | ICD-10-CM | POA: Diagnosis not present

## 2017-01-24 DIAGNOSIS — J441 Chronic obstructive pulmonary disease with (acute) exacerbation: Secondary | ICD-10-CM | POA: Diagnosis not present

## 2017-01-24 DIAGNOSIS — I739 Peripheral vascular disease, unspecified: Secondary | ICD-10-CM | POA: Diagnosis not present

## 2017-01-24 DIAGNOSIS — Z85528 Personal history of other malignant neoplasm of kidney: Secondary | ICD-10-CM | POA: Diagnosis not present

## 2017-01-24 DIAGNOSIS — Z8551 Personal history of malignant neoplasm of bladder: Secondary | ICD-10-CM | POA: Diagnosis not present

## 2017-01-24 DIAGNOSIS — R0602 Shortness of breath: Secondary | ICD-10-CM | POA: Diagnosis present

## 2017-01-24 DIAGNOSIS — F1010 Alcohol abuse, uncomplicated: Secondary | ICD-10-CM | POA: Diagnosis not present

## 2017-01-24 DIAGNOSIS — Z23 Encounter for immunization: Secondary | ICD-10-CM | POA: Diagnosis not present

## 2017-01-24 DIAGNOSIS — K219 Gastro-esophageal reflux disease without esophagitis: Secondary | ICD-10-CM | POA: Diagnosis not present

## 2017-01-24 DIAGNOSIS — R000 Tachycardia, unspecified: Secondary | ICD-10-CM | POA: Diagnosis not present

## 2017-01-24 LAB — BASIC METABOLIC PANEL
ANION GAP: 7 (ref 5–15)
BUN: 20 mg/dL (ref 6–20)
CALCIUM: 8.7 mg/dL — AB (ref 8.9–10.3)
CO2: 23 mmol/L (ref 22–32)
CREATININE: 1.01 mg/dL (ref 0.61–1.24)
Chloride: 110 mmol/L (ref 101–111)
GLUCOSE: 176 mg/dL — AB (ref 65–99)
Potassium: 4.1 mmol/L (ref 3.5–5.1)
Sodium: 140 mmol/L (ref 135–145)

## 2017-01-24 LAB — CBC
HCT: 40.4 % (ref 40.0–52.0)
Hemoglobin: 13.9 g/dL (ref 13.0–18.0)
MCH: 33.4 pg (ref 26.0–34.0)
MCHC: 34.3 g/dL (ref 32.0–36.0)
MCV: 97.3 fL (ref 80.0–100.0)
Platelets: 149 10*3/uL — ABNORMAL LOW (ref 150–440)
RBC: 4.15 MIL/uL — ABNORMAL LOW (ref 4.40–5.90)
RDW: 13.7 % (ref 11.5–14.5)
WBC: 8.6 10*3/uL (ref 3.8–10.6)

## 2017-01-24 MED ORDER — IPRATROPIUM-ALBUTEROL 0.5-2.5 (3) MG/3ML IN SOLN: 3.0000 mL | Freq: Four times a day (QID) | RESPIRATORY_TRACT | 0 refills | Status: DC | PRN

## 2017-01-24 MED ORDER — MOMETASONE FURO-FORMOTEROL FUM 100-5 MCG/ACT IN AERO: 2.0000 | INHALATION_SPRAY | Freq: Two times a day (BID) | RESPIRATORY_TRACT | 0 refills | Status: DC

## 2017-01-24 MED ORDER — PREDNISONE 50 MG PO TABS: 50.0000 mg | ORAL_TABLET | Freq: Every day | ORAL | 0 refills | Status: AC

## 2017-01-24 MED ORDER — MOMETASONE FURO-FORMOTEROL FUM 100-5 MCG/ACT IN AERO
2.0000 | INHALATION_SPRAY | Freq: Two times a day (BID) | RESPIRATORY_TRACT | Status: DC
  Administered 2017-01-24: 2 via RESPIRATORY_TRACT
  Filled 2017-01-24: qty 8.8

## 2017-01-24 MED ORDER — LEVOFLOXACIN 500 MG PO TABS: 500.0000 mg | ORAL_TABLET | Freq: Every day | ORAL | 0 refills | Status: AC

## 2017-01-24 MED ORDER — METHYLPREDNISOLONE SODIUM SUCC 40 MG IJ SOLR: 40.0000 mg | Freq: Two times a day (BID) | INTRAMUSCULAR | Status: DC

## 2017-01-24 MED ORDER — IPRATROPIUM-ALBUTEROL 0.5-2.5 (3) MG/3ML IN SOLN: 3.0000 mL | Freq: Two times a day (BID) | RESPIRATORY_TRACT | Status: DC

## 2017-01-24 NOTE — Discharge Summary (Signed)
[facility] NAME: Brady Smith    MR#:  [id number]  DATE OF BIRTH:  Oct 01, 1945  DATE OF ADMISSION:  01/23/2017 ADMITTING PHYSICIAN: Dustin Flock, MD  DATE OF DISCHARGE: 01/24/2017  PRIMARY CARE PHYSICIAN: Glean Hess, MD    ADMISSION DIAGNOSIS:  SOB (shortness of breath) [R06.02] COPD exacerbation (HCC) [J44.1] Acute respiratory failure with hypoxia ([place]) [J96.01]  DISCHARGE DIAGNOSIS:  Active Problems:   Acute respiratory failure ([place])   SECONDARY DIAGNOSIS:   Past Medical History:  Diagnosis Date  . Anxiety   . Bladder cancer ([place])   . COPD (chronic obstructive pulmonary disease) ([place])   . Depression   . GERD (gastroesophageal reflux disease)   . History of kidney cancer   . Peripheral vascular disease ([place])   . Pneumonia     HOSPITAL COURSE:   71 year old male with history of tobacco dependence and COPD because of shortness of breath.   1. Acute hypoxic respiratory failure in the setting of acute bronchitis and COPD acute exacerbation He will need oxygen upon discharge  2. Acute COPD exacerbation: IV steroids were transitioned to oral steroids at discharge. Continue Levaquin for acute bronchitis He will need long-acting inhaler upon discharge.  3.Tobacco dependence: Patient is encouraged to quit smoking. Counseling was provided for 4 minutes.   4. Sinus tachycardia: This has resolved  5. PVD: Continue aspirin   DISCHARGE CONDITIONS AND DIET:   Stable Regular diet  CONSULTS OBTAINED:    DRUG ALLERGIES:  No Known Allergies  DISCHARGE MEDICATIONS:   Current Discharge Medication List    START taking these medications   Details  levofloxacin (LEVAQUIN) 500 MG tablet Take 1 tablet (500 mg total) by mouth daily. Qty: 4 tablet, Refills: 0    mometasone-formoterol (DULERA) 100-5 MCG/ACT AERO Inhale 2 puffs into the lungs 2 (two) times daily. Qty: 0.1 g, Refills: 0    predniSONE  (DELTASONE) 50 MG tablet Take 1 tablet (50 mg total) by mouth daily with breakfast. Qty: 5 tablet, Refills: 0      CONTINUE these medications which have NOT CHANGED   Details  albuterol (PROVENTIL HFA;VENTOLIN HFA) 108 (90 Base) MCG/ACT inhaler Inhale 2 puffs into the lungs every 6 (six) hours as needed for wheezing or shortness of breath.    aspirin EC 81 MG tablet Take 81 mg by mouth every other day.          Today   CHIEF COMPLAINT:  Doing better this am   VITAL SIGNS:  Blood pressure 118/67, pulse 80, temperature (!) 97.5 F (36.4 C), temperature source Oral, resp. rate 18, height 5\' 8"  (1.727 m), weight 83.5 kg (184 lb), SpO2 95 %.   REVIEW OF SYSTEMS:  Review of Systems  Constitutional: Negative.  Negative for chills, fever and malaise/fatigue.  HENT: Negative.  Negative for ear discharge, ear pain, hearing loss, nosebleeds and sore throat.   Eyes: Negative.  Negative for blurred vision and pain.  Respiratory: Negative.  Negative for cough, hemoptysis, shortness of breath and wheezing.   Cardiovascular: Negative.  Negative for chest pain, palpitations and leg swelling.  Gastrointestinal: Negative.  Negative for abdominal pain, blood in stool, diarrhea, nausea and vomiting.  Genitourinary: Negative.  Negative for dysuria.  Musculoskeletal: Negative.  Negative for back pain.  Skin: Negative.   Neurological: Negative for dizziness, tremors, speech change, focal weakness, seizures and headaches.  Endo/Heme/Allergies: Negative.  Does not bruise/bleed easily.  Psychiatric/Behavioral: Negative.  Negative for depression, hallucinations  and suicidal ideas.     PHYSICAL EXAMINATION:  GENERAL:  71 y.o.-year-old patient lying in the bed with no acute distress.  NECK:  Supple, no jugular venous distention. No thyroid enlargement, no tenderness.  LUNGS: Normal breath sounds bilaterally, no wheezing, rales,rhonchi  No use of accessory muscles of respiration.  CARDIOVASCULAR:  S1, S2 normal. No murmurs, rubs, or gallops.  ABDOMEN: Soft, non-tender, non-distended. Bowel sounds present. No organomegaly or mass.  EXTREMITIES: No pedal edema, cyanosis, or clubbing.  PSYCHIATRIC: The patient is alert and oriented x 3.  SKIN: No obvious rash, lesion, or ulcer.   DATA REVIEW:   CBC  Recent Labs Lab 01/24/17 0445  WBC 8.6  HGB 13.9  HCT 40.4  PLT 149*    Chemistries   Recent Labs Lab 01/24/17 0445  NA 140  K 4.1  CL 110  CO2 23  GLUCOSE 176*  BUN 20  CREATININE 1.01  CALCIUM 8.7*    Cardiac Enzymes  Recent Labs Lab 01/23/17 1527  [place] <0.03    Microbiology Results  @[email or web address] @  RADIOLOGY:  Dg Chest Port 1 View  Result Date: 01/23/2017 CLINICAL DATA:  71 y/o  M; shortness of breath. EXAM: PORTABLE CHEST 1 VIEW COMPARISON:  06/07/2015 chest radiograph. FINDINGS: Normal cardiac silhouette. Aortic atherosclerosis with calcification. Clear lungs. No pleural effusion or pneumothorax. No acute osseous abnormality is evident. IMPRESSION: No active disease. Electronically Signed   By: Kristine Garbe M.D.   On: 01/23/2017 16:34      Current Discharge Medication List    START taking these medications   Details  levofloxacin (LEVAQUIN) 500 MG tablet Take 1 tablet (500 mg total) by mouth daily. Qty: 4 tablet, Refills: 0    mometasone-formoterol (DULERA) 100-5 MCG/ACT AERO Inhale 2 puffs into the lungs 2 (two) times daily. Qty: 0.1 g, Refills: 0    predniSONE (DELTASONE) 50 MG tablet Take 1 tablet (50 mg total) by mouth daily with breakfast. Qty: 5 tablet, Refills: 0      CONTINUE these medications which have NOT CHANGED   Details  albuterol (PROVENTIL HFA;VENTOLIN HFA) 108 (90 Base) MCG/ACT inhaler Inhale 2 puffs into the lungs every 6 (six) hours as needed for wheezing or shortness of breath.    aspirin EC 81 MG tablet Take 81 mg by mouth every other day.           Management plans discussed with the patient and he  is in agreement. Stable for discharge home  Patient should follow up with pcp  CODE STATUS:     Code Status Orders        Start     Ordered   01/23/17 1852  Full code  Continuous     01/23/17 1851    Code Status History    Date Active Date Inactive Code Status Order ID Comments User Context   05/18/2015  3:51 AM 06/08/2015 11:41 PM Full Code [id number]  Clayburn Pert, MD Inpatient      TOTAL TIME TAKING CARE OF THIS PATIENT: 37 minutes.    Note: This dictation was prepared with Dragon dictation along with smaller phrase technology. Any transcriptional errors that result from this process are unintentional.  Brady Smith M.D on 01/24/2017 at 9:35 AM  Between 7am to 6pm - Pager - [phone] After 6pm go to [email or web address] - password [email or web address] [facility]  Office  [phone]  CC: Primary care physician; Glean Hess, MD

## 2017-01-24 NOTE — Progress Notes (Signed)
[facility] NAME: Brady Smith    MR#:  [id number]  DATE OF BIRTH:  [birth year]  SUBJECTIVE:   Patient feels better this am Had some rib pain earlier today but seems to have subsided He drink 40 ounce of miller light daily but has no issues with withdrawal he tells me   REVIEW OF SYSTEMS:    Review of Systems  Constitutional: Negative for fever, chills weight loss HENT: Negative for ear pain, nosebleeds, congestion, facial swelling, rhinorrhea, neck pain, neck stiffness and ear discharge.   Respiratory: Positive for cough. Shortness of breath and wheezing improved  Cardiovascular: Negative for chest pain, palpitations and leg swelling.  Gastrointestinal: Negative for heartburn, abdominal pain, vomiting, diarrhea or consitpation Genitourinary: Negative for dysuria, urgency, frequency, hematuria Musculoskeletal: Negative for back pain or joint pain Neurological: Negative for dizziness, seizures, syncope, focal weakness,  numbness and headaches.  Hematological: Does not bruise/bleed easily.  Psychiatric/Behavioral: Negative for hallucinations, confusion, dysphoric mood    Tolerating Diet: yes      DRUG ALLERGIES:  No Known Allergies  VITALS:  Blood pressure 118/67, pulse 80, temperature (!) 97.5 F (36.4 C), temperature source Oral, resp. rate 18, height 5\' 8"  (1.727 m), weight 83.5 kg (184 lb), SpO2 95 %.  PHYSICAL EXAMINATION:  Constitutional: Appears well-developed and well-nourished. No distress. HENT: Normocephalic. Marland Kitchen Oropharynx is clear and moist.  Eyes: Conjunctivae and EOM are normal. PERRLA, no scleral icterus.  Neck: Normal ROM. Neck supple. No JVD. No tracheal deviation. CVS: RRR, S1/S2 +, no murmurs, no gallops, no carotid bruit.  Pulmonary: Effort and breath sounds normal, no stridor, rhonchi, wheezes, rales.  Abdominal: Soft. BS +,  no distension, tenderness, rebound or guarding.  Musculoskeletal: Normal  range of motion. No edema and no tenderness.  Neuro: Alert. CN 2-12 grossly intact. No focal deficits. Skin: Skin is warm and dry. No rash noted. Psychiatric: Normal mood and affect.      LABORATORY PANEL:   CBC  Recent Labs Lab 01/24/17 0445  WBC 8.6  HGB 13.9  HCT 40.4  PLT 149*   ------------------------------------------------------------------------------------------------------------------  Chemistries   Recent Labs Lab 01/24/17 0445  NA 140  K 4.1  CL 110  CO2 23  GLUCOSE 176*  BUN 20  CREATININE 1.01  CALCIUM 8.7*   ------------------------------------------------------------------------------------------------------------------  Cardiac Enzymes  Recent Labs Lab 01/23/17 1527  TROPONINI <0.03   ------------------------------------------------------------------------------------------------------------------  RADIOLOGY:  Dg Chest Port 1 View  Result Date: 01/23/2017 CLINICAL DATA:  71 y/o  M; shortness of breath. EXAM: PORTABLE CHEST 1 VIEW COMPARISON:  06/07/2015 chest radiograph. FINDINGS: Normal cardiac silhouette. Aortic atherosclerosis with calcification. Clear lungs. No pleural effusion or pneumothorax. No acute osseous abnormality is evident. IMPRESSION: No active disease. Electronically Signed   By: Kristine Garbe M.D.   On: 01/23/2017 16:34     ASSESSMENT AND PLAN:   71 year old male with history of tobacco dependence and COPD because of shortness of breath.   1. Acute hypoxic respiratory failure in the setting of acute bronchitis and COPD acute exacerbation Wean oxygen as tolerated Will likely need oxygen upon discharge  2. Acute COPD exacerbation: Wean IV steroids to 40 every 12 with plans to change oral tomorrow Continue nebs Continue Levaquin for acute bronchitis He will need long-acting inhaler upon discharge.  3.Tobacco dependence: Patient is encouraged to quit smoking. Counseling was provided for 4 minutes.   4.  Sinus tachycardia: This has resolved  5. PVD: Continue aspirin Management  plans discussed with the patient and he is in agreement.  CODE STATUS: full  TOTAL TIME TAKING CARE OF THIS PATIENT: 30 minutes.   PT consultation for discharge planning.  POSSIBLE D/C today or tomorrow, DEPENDING ON CLINICAL CONDITION.   Zoriah Pulice M.D on 01/24/2017 at 9:27 AM  Between 7am to 6pm - Pager - [phone] After 6pm go to [email or web address] - password [email or web address] [facility]  Office  [phone]  CC: Primary care physician; Glean Hess, MD  Note: This dictation was prepared with Dragon dictation along with smaller phrase technology. Any transcriptional errors that result from this process are unintentional.

## 2017-01-24 NOTE — Discharge Instructions (Signed)

## 2017-01-24 NOTE — Care Management Important Message (Signed)
Important Message  Patient Details  Name: Brady Smith MRN: [id number] Date of Birth: [birth year]   Medicare Important Message Given:  N/A - LOS <3 / Initial given by admissions    Beverly Sessions, RN 01/24/2017, 12:23 PM

## 2017-01-24 NOTE — Care Management (Signed)
Patient to discharge home today.  RN has checks sats and patient does not have qualify saturations for home O2.  Patient to discharge with script for neb solution.  Home nebulizer ordered.  To be delivered to room by Corene Cornea from [facility] prior to discharge.

## 2017-01-24 NOTE — Progress Notes (Signed)
SATURATION QUALIFICATIONS: (This note is used to comply with regulatory documentation for home oxygen)  Patient Saturations on Room Air at Rest = 94%  Patient Saturations on Room Air while Ambulating = 90%  Patient Saturations on 0 Liters of oxygen while Ambulating = 90%  Please briefly explain why patient needs home oxygen:  Pt does not need home oxygen.

## 2017-01-24 NOTE — Progress Notes (Signed)
Pt d/c to home today.  IV removed intact.  D/c paperwork printed and reviewed w/pt.  All medication questions and concerns reviewed and pt states understanding.  All Rx's given to patient. Pt was wheelchair out of hospital.

## 2017-01-28 ENCOUNTER — Inpatient Hospital Stay: Payer: Self-pay | Admitting: Internal Medicine

## 2017-01-28 LAB — BLOOD GAS, VENOUS
Patient temperature: 37
pCO2, Ven: 56 mmHg (ref 44.0–60.0)
pH, Ven: 7.3 (ref 7.250–7.430)

## 2017-02-03 ENCOUNTER — Ambulatory Visit (INDEPENDENT_AMBULATORY_CARE_PROVIDER_SITE_OTHER): Payer: Medicare PPO

## 2017-02-03 ENCOUNTER — Encounter: Payer: Self-pay | Admitting: Internal Medicine

## 2017-02-03 ENCOUNTER — Ambulatory Visit (INDEPENDENT_AMBULATORY_CARE_PROVIDER_SITE_OTHER): Payer: Medicare PPO | Admitting: Internal Medicine

## 2017-02-03 VITALS — BP 162/80 | HR 74 | Temp 97.4°F | Resp 16 | Ht 68.0 in | Wt 179.6 lb

## 2017-02-03 VITALS — BP 140/86 | HR 73 | Ht 68.0 in | Wt 179.0 lb

## 2017-02-03 DIAGNOSIS — J431 Panlobular emphysema: Secondary | ICD-10-CM | POA: Diagnosis not present

## 2017-02-03 DIAGNOSIS — R739 Hyperglycemia, unspecified: Secondary | ICD-10-CM | POA: Diagnosis not present

## 2017-02-03 DIAGNOSIS — R030 Elevated blood-pressure reading, without diagnosis of hypertension: Secondary | ICD-10-CM | POA: Diagnosis not present

## 2017-02-03 DIAGNOSIS — Z0000 Encounter for general adult medical examination without abnormal findings: Secondary | ICD-10-CM | POA: Diagnosis not present

## 2017-02-03 NOTE — Progress Notes (Signed)
Date:  02/03/2017   Name:  Brady Smith   DOB:  1945-09-11   MRN:  [id number]   Chief Complaint: Hypertension and Hospitalization Follow-up Hypertension  This is a new problem. The problem has been waxing and waning since onset. Associated symptoms include shortness of breath. Pertinent negatives include no chest pain, headaches or palpitations. There are no associated agents to hypertension. Past treatments include nothing.  He has not had elevated bp until he was recently hospitalized.  His pressure was initially high but at discharge was normal.  He was also started on duo-nebs twice a day. Since being home his BP have remained in the 140-150 range and he is concerned.  COPD - he was discharged on levaquin as well but never took it.  He also stopped taking Symbicort or Dulera since he was using the duo-nebs.  He says that his breathing is better on nebulizer.  Admitted to [facility] with COPD exacerbation on  01/23/17 through 01/24/17.  He was discharged to get oxygen and levaquin.  He has neither one.   Review of Systems  Constitutional: Negative for chills, fatigue and fever.  Respiratory: Positive for cough and shortness of breath. Negative for chest tightness and wheezing.   Cardiovascular: Negative for chest pain and palpitations.  Gastrointestinal: Negative for abdominal pain.  Neurological: Negative for dizziness, numbness and headaches.    Patient Active Problem List   Diagnosis Date Noted  . Elevated blood sugar 02/03/2017  . Elevated BP without diagnosis of hypertension 02/03/2017  . S/P amputation of lesser toe ([place]) 07/25/2015  . Peripheral vascular disease ([place]) 07/14/2015  . AA (alcohol abuse) 05/23/2015  . Urinary retention 05/18/2015  . Choroidal nevus, right eye 02/08/2015  . Carpal tunnel syndrome 01/13/2015  . H/O gastrointestinal disease 01/13/2015  . History of primary malignant neoplasm of urinary bladder 01/13/2015  . Compulsive tobacco user syndrome  01/13/2015  . HLD (hyperlipidemia) 01/13/2015  . Neurosis, phobic 01/13/2015  . Chronic obstructive pulmonary disease ([place]) 06/11/2011  . Lung nodule, solitary 06/11/2011    Prior to Admission medications   Medication Sig Start Date End Date Taking? Authorizing Provider  aspirin EC 81 MG tablet Take 81 mg by mouth every other day.   Yes [provider]  ipratropium-albuterol (DUONEB) 0.5-2.5 (3) MG/3ML SOLN Take 3 mLs by nebulization every 6 (six) hours as needed (shortness of breath). 01/24/17  Yes Mody, Ulice Bold, MD  mometasone-formoterol (DULERA) 100-5 MCG/ACT AERO Inhale 2 puffs into the lungs 2 (two) times daily. Patient not taking: Reported on 02/03/2017 01/24/17 02/23/17  Bettey Costa, MD    No Known Allergies  Past Surgical History:  Procedure Laterality Date  . CHOLECYSTECTOMY    . COLONOSCOPY  2000  . CYSTECTOMY W/ CONTINENT DIVERSION  1996  . HERNIA REPAIR    . PROSTATECTOMY  1996  . TONSILLECTOMY      Social History   Tobacco Use  . Smoking status: Current Every Day Smoker    Packs/day: 2.00    Years: 57.00    Pack years: 114.00    Types: Cigarettes  . Smokeless tobacco: Never Used  . Tobacco comment: patient not ready to quit  Substance Use Topics  . Alcohol use: Yes    Alcohol/week: 8.4 oz    Types: 14 Standard drinks or equivalent per week    Comment: quit a few weeks away, before then 2/day, 11/03- 40 oz of beer a day   . Drug use: No  Medication list has been reviewed and updated.  PHQ 2/9 Scores 01/23/2017 01/19/2016 08/11/2015 08/30/2014  PHQ - 2 Score 0 1 0 0    Physical Exam  Constitutional: He is oriented to person, place, and time. He appears well-developed. No distress.  HENT:  Head: Normocephalic and atraumatic.  Cardiovascular: Normal rate, regular rhythm and normal heart sounds.  Pulmonary/Chest: Effort normal. No respiratory distress. He has decreased breath sounds. He has no wheezes. He has no rhonchi.  Musculoskeletal: Normal  range of motion.  Neurological: He is alert and oriented to person, place, and time.  Skin: Skin is warm and dry. No rash noted.  Psychiatric: He has a normal mood and affect. His behavior is normal. Thought content normal.  Nursing note and vitals reviewed.   BP 140/86   Pulse 73   Ht 5\' 8"  (1.727 m)   Wt 179 lb (81.2 kg)   SpO2 96%   BMI 27.22 kg/m   Assessment and Plan: 1. Panlobular emphysema (HCC) Resume Symbicort Use duo-nebs bid PRN  2. Elevated blood sugar Will check at next visit  3. Elevated BP without diagnosis of hypertension Continue to monitor Return for recheck and labs in 4 weeks   No orders of the defined types were placed in this encounter.   Partially dictated using Editor, commissioning. Any errors are unintentional.  Halina Maidens, MD [facility]  02/03/2017

## 2017-02-03 NOTE — Progress Notes (Signed)
   Subjective:   Brady Smith is a 71 y.o. male who presents for Medicare Annual/Subsequent preventive examination.  Review of Systems:  N/A       Objective:    Vitals: BP (!) 162/80 (BP Location: Right Arm, Patient Position: Sitting, Cuff Size: Normal)   Pulse 74   Temp (!) 97.4 F (36.3 C) (Oral)   Resp 16   Ht 5\' 8"  (1.727 m)   Wt 179 lb 9.6 oz (81.5 kg)   BMI 27.31 kg/m   Body mass index is 27.31 kg/m.  Attempt was made to begin Medicare Annual Wellness Exam. Pt refused to answer Clinical Intake questions stating, "I'm gonna cut this short", "this is very elementary" and "this is unnecessary". Attempts made to redirect pt re: reason for today's visit. Pt proceeded to walk out of exam room.  Further discussion had with Dr. [name] re: pt most recent discharge from hospital, today's elevated B/P and pt most recent no show for hospital follow up. Dr. [name] agreed to see pt today to further evaluate his concerns.   Signed,  Aleatha Borer, LPN Nurse Health Advisor

## 2017-02-03 NOTE — Patient Instructions (Addendum)
Use Symbicort twice a day (red long acting inhaler with meter on the end)  Use nebulizer AS NEEDED twice a day  Check Blood pressure 2-3 times per week - and not after using nebulizer - and write it down for me to see next visit  Goal for blood pressure is less than 140/85

## 2017-03-06 ENCOUNTER — Ambulatory Visit: Payer: Medicare PPO | Admitting: Internal Medicine

## 2017-03-06 ENCOUNTER — Encounter: Payer: Self-pay | Admitting: Internal Medicine

## 2017-03-06 VITALS — BP 116/68 | HR 78 | Ht 68.0 in | Wt 179.0 lb

## 2017-03-06 DIAGNOSIS — Z89421 Acquired absence of other right toe(s): Secondary | ICD-10-CM | POA: Diagnosis not present

## 2017-03-06 DIAGNOSIS — R030 Elevated blood-pressure reading, without diagnosis of hypertension: Secondary | ICD-10-CM

## 2017-03-06 DIAGNOSIS — J431 Panlobular emphysema: Secondary | ICD-10-CM

## 2017-03-06 DIAGNOSIS — Z1159 Encounter for screening for other viral diseases: Secondary | ICD-10-CM | POA: Diagnosis not present

## 2017-03-06 DIAGNOSIS — R739 Hyperglycemia, unspecified: Secondary | ICD-10-CM | POA: Diagnosis not present

## 2017-03-06 NOTE — Progress Notes (Signed)
Date:  03/06/2017   Name:  Brady Smith   DOB:  [birth year]   MRN:  [id number]   Chief Complaint: Hypertension Hypertension  This is a chronic problem. The problem has been gradually improving since onset. Associated symptoms include shortness of breath. Pertinent negatives include no chest pain, headaches, palpitations or peripheral edema. There are no associated agents to hypertension. Past treatments include nothing. There are no compliance problems.    Elevated Blood sugar - noted to be slightly elevated at ER.  A1C three months ago was borderline. Lab Results  Component Value Date   HGBA1C 5.9 (H) 11/29/2016   Hx of toe amputation - healed with no problems per patient.  Ambulation is not impaired.  COPD - doing better.  Using MDI and nebulizer.  No chronic sputum or wheezing.  Still short of breast but improved.  Review of Systems  Constitutional: Negative for chills, fatigue and fever.  Respiratory: Positive for cough, shortness of breath and wheezing. Negative for chest tightness.   Cardiovascular: Negative for chest pain, palpitations and leg swelling.  Gastrointestinal: Negative for abdominal pain.  Allergic/Immunologic: Negative for environmental allergies.  Neurological: Negative for dizziness and headaches.    Patient Active Problem List   Diagnosis Date Noted  . Elevated blood sugar 02/03/2017  . Elevated BP without diagnosis of hypertension 02/03/2017  . S/P amputation of lesser toe ([place]) 07/25/2015  . Peripheral vascular disease ([place]) 07/14/2015  . AA (alcohol abuse) 05/23/2015  . Urinary retention 05/18/2015  . Choroidal nevus, right eye 02/08/2015  . Carpal tunnel syndrome 01/13/2015  . H/O gastrointestinal disease 01/13/2015  . History of primary malignant neoplasm of urinary bladder 01/13/2015  . Compulsive tobacco user syndrome 01/13/2015  . HLD (hyperlipidemia) 01/13/2015  . Neurosis, phobic 01/13/2015  . Chronic obstructive pulmonary disease ([place])  06/11/2011  . Lung nodule, solitary 06/11/2011    Prior to Admission medications   Medication Sig Start Date End Date Taking? Authorizing Provider  aspirin EC 81 MG tablet Take 81 mg by mouth every other day.   Yes [provider]  ipratropium-albuterol (DUONEB) 0.5-2.5 (3) MG/3ML SOLN Take 3 mLs by nebulization every 6 (six) hours as needed (shortness of breath). 01/24/17  Yes Mody, Ulice Bold, MD  mometasone-formoterol (DULERA) 100-5 MCG/ACT AERO Inhale 2 puffs into the lungs 2 (two) times daily. Patient not taking: Reported on 02/03/2017 01/24/17 02/23/17  Bettey Costa, MD    No Known Allergies  Past Surgical History:  Procedure Laterality Date  . AMPUTATION Right 07/19/2015   Procedure: AMPUTATION DIGIT ( RIGHT FOOT FIFTH TOE );  Surgeon: Algernon Huxley, MD;  Location: [facility] ORS;  Service: Vascular;  Laterality: Right;  . BOWEL RESECTION  05/20/2015   Procedure: SMALL BOWEL RESECTION;  Surgeon: Clayburn Pert, MD;  Location: [facility] ORS;  Service: General;;  . CHOLECYSTECTOMY    . COLONOSCOPY  2000  . CYSTECTOMY W/ CONTINENT DIVERSION  1996  . HERNIA REPAIR    . LAPAROTOMY N/A 05/20/2015   Procedure: EXPLORATORY LAPAROTOMY;  Surgeon: Clayburn Pert, MD;  Location: [facility] ORS;  Service: General;  Laterality: N/A;  . PERIPHERAL VASCULAR CATHETERIZATION N/A 07/03/2015   Procedure: Abdominal Aortogram w/Lower Extremity;  Surgeon: Algernon Huxley, MD;  Location: [place] [facility];  Service: Cardiovascular;  Laterality: N/A;  . PERIPHERAL VASCULAR CATHETERIZATION  07/03/2015   Procedure: Lower Extremity Intervention;  Surgeon: Algernon Huxley, MD;  Location: [place] [facility];  Service: Cardiovascular;;  . PROSTATECTOMY  1996  .  TONSILLECTOMY      Social History   Tobacco Use  . Smoking status: Current Every Day Smoker    Packs/day: 2.00    Years: 57.00    Pack years: 114.00    Types: Cigarettes  . Smokeless tobacco: Never Used  . Tobacco comment: patient not ready to quit  Substance Use  Topics  . Alcohol use: Yes    Alcohol/week: 8.4 oz    Types: 14 Standard drinks or equivalent per week    Comment: quit a few weeks away, before then 2/day, 11/03- 40 oz of beer a day   . Drug use: No     Medication list has been reviewed and updated.  PHQ 2/9 Scores 01/23/2017 01/19/2016 08/11/2015 08/30/2014  PHQ - 2 Score 0 1 0 0    Physical Exam  Constitutional: He is oriented to person, place, and time. He appears well-developed. No distress.  HENT:  Head: Normocephalic and atraumatic.  Cardiovascular: Normal rate, regular rhythm and normal heart sounds.  Pulmonary/Chest: Effort normal. No respiratory distress. He has decreased breath sounds. He has no wheezes. He has no rhonchi.  Musculoskeletal: Normal range of motion.  Neurological: He is alert and oriented to person, place, and time.  Skin: Skin is warm and dry. No rash noted.  Psychiatric: He has a normal mood and affect. His speech is normal and behavior is normal. Thought content normal.  Nursing note and vitals reviewed.   BP 116/68   Pulse 78   Ht 5\' 8"  (1.727 m)   Wt 179 lb (81.2 kg)   SpO2 97%   BMI 27.22 kg/m   Assessment and Plan: 1. Elevated BP without diagnosis of hypertension Normal BP here and at home Will continue to monitor  2. Elevated blood sugar - Hemoglobin A1c  3. Panlobular emphysema (HCC) Continue MDI daily and nebulizer as needed  4. Absence of toe of right foot ([place]) healed  5. Need for hepatitis C screening test - Hepatitis C antibody   No orders of the defined types were placed in this encounter.   Partially dictated using Editor, commissioning. Any errors are unintentional.  Halina Maidens, MD [facility]  03/06/2017

## 2017-03-07 LAB — HEMOGLOBIN A1C
Est. average glucose Bld gHb Est-mCnc: 105 mg/dL
Hgb A1c MFr Bld: 5.3 % (ref 4.8–5.6)

## 2017-03-07 LAB — HEPATITIS C ANTIBODY

## 2017-03-29 DIAGNOSIS — F17200 Nicotine dependence, unspecified, uncomplicated: Secondary | ICD-10-CM | POA: Diagnosis not present

## 2017-03-29 DIAGNOSIS — Z6827 Body mass index (BMI) 27.0-27.9, adult: Secondary | ICD-10-CM | POA: Diagnosis not present

## 2017-03-29 DIAGNOSIS — I10 Essential (primary) hypertension: Secondary | ICD-10-CM | POA: Diagnosis not present

## 2017-03-29 DIAGNOSIS — M545 Low back pain: Secondary | ICD-10-CM | POA: Diagnosis not present

## 2017-03-29 DIAGNOSIS — F1010 Alcohol abuse, uncomplicated: Secondary | ICD-10-CM | POA: Diagnosis not present

## 2017-03-29 DIAGNOSIS — J449 Chronic obstructive pulmonary disease, unspecified: Secondary | ICD-10-CM | POA: Diagnosis not present

## 2017-03-29 DIAGNOSIS — E663 Overweight: Secondary | ICD-10-CM | POA: Diagnosis not present

## 2017-03-29 DIAGNOSIS — M1710 Unilateral primary osteoarthritis, unspecified knee: Secondary | ICD-10-CM | POA: Diagnosis not present

## 2017-03-29 DIAGNOSIS — H269 Unspecified cataract: Secondary | ICD-10-CM | POA: Diagnosis not present

## 2017-06-13 ENCOUNTER — Encounter: Payer: Self-pay | Admitting: Internal Medicine

## 2017-06-13 ENCOUNTER — Ambulatory Visit: Payer: Medicare PPO | Admitting: Internal Medicine

## 2017-06-13 VITALS — BP 124/68 | HR 88 | Ht 68.0 in | Wt 181.0 lb

## 2017-06-13 DIAGNOSIS — I480 Paroxysmal atrial fibrillation: Secondary | ICD-10-CM | POA: Diagnosis not present

## 2017-06-13 DIAGNOSIS — Z89421 Acquired absence of other right toe(s): Secondary | ICD-10-CM | POA: Diagnosis not present

## 2017-06-13 DIAGNOSIS — J431 Panlobular emphysema: Secondary | ICD-10-CM | POA: Diagnosis not present

## 2017-06-13 DIAGNOSIS — F17200 Nicotine dependence, unspecified, uncomplicated: Secondary | ICD-10-CM

## 2017-06-13 DIAGNOSIS — R739 Hyperglycemia, unspecified: Secondary | ICD-10-CM

## 2017-06-13 DIAGNOSIS — I739 Peripheral vascular disease, unspecified: Secondary | ICD-10-CM | POA: Diagnosis not present

## 2017-06-13 NOTE — Patient Instructions (Signed)
Use nebulizer machine three times a day  Use inhaler once a day in between the nebulizer  Cut back or stop smoking completely

## 2017-06-13 NOTE — Progress Notes (Signed)
Date:  06/13/2017   Name:  Brady Smith   DOB:  1945/12/21   MRN:  [id number]   Chief Complaint: Diabetes and COPD Diabetes  He presents for his initial (pre-diabetes) diabetic visit. The initial diagnosis of diabetes was made 6 months ago. Pertinent negatives for hypoglycemia include no dizziness or headaches. Pertinent negatives for diabetes include no chest pain, no fatigue and no weight loss. When asked about current treatments, none were reported.  COPD  He complains of chest tightness, shortness of breath and wheezing. This is a chronic problem. The problem occurs constantly. The problem has been unchanged. Associated symptoms include dyspnea on exertion. Pertinent negatives include no chest pain, fever, headaches, trouble swallowing or weight loss. Associated symptoms comments: And episodes of "complete loss of breath" that last a minute or so, not associated with any particular activity.. His symptoms are alleviated by beta-agonist. His past medical history is significant for COPD.  PVD - still has leg cramps with walking.  Still smoking 1/5 ppd.  No swelling or skin changes. AFib - has hx of paroxismal afib but none recently.     Review of Systems  Constitutional: Negative for chills, fatigue, fever and weight loss.  HENT: Negative for trouble swallowing.   Eyes: Negative for visual disturbance.  Respiratory: Positive for chest tightness, shortness of breath and wheezing. Negative for choking.   Cardiovascular: Positive for dyspnea on exertion. Negative for chest pain, palpitations and leg swelling.  Gastrointestinal: Negative for abdominal pain, constipation and diarrhea.  Musculoskeletal: Positive for gait problem (claudication).  Allergic/Immunologic: Negative for environmental allergies.  Neurological: Negative for dizziness, light-headedness and headaches.  Psychiatric/Behavioral: Negative for sleep disturbance.    Patient Active Problem List   Diagnosis Date Noted  .  Elevated blood sugar 02/03/2017  . Elevated BP without diagnosis of hypertension 02/03/2017  . S/P amputation of lesser toe ([place]) 07/25/2015  . Peripheral vascular disease ([place]) 07/14/2015  . AA (alcohol abuse) 05/23/2015  . Urinary retention 05/18/2015  . Choroidal nevus, right eye 02/08/2015  . Carpal tunnel syndrome 01/13/2015  . H/O gastrointestinal disease 01/13/2015  . History of primary malignant neoplasm of urinary bladder 01/13/2015  . Compulsive tobacco user syndrome 01/13/2015  . HLD (hyperlipidemia) 01/13/2015  . Neurosis, phobic 01/13/2015  . Chronic obstructive pulmonary disease ([place]) 06/11/2011  . Lung nodule, solitary 06/11/2011    Prior to Admission medications   Medication Sig Start Date End Date Taking? Authorizing Provider  aspirin EC 81 MG tablet Take 81 mg by mouth every other day.   Yes [provider]  ipratropium-albuterol (DUONEB) 0.5-2.5 (3) MG/3ML SOLN Take 3 mLs by nebulization every 6 (six) hours as needed (shortness of breath). 01/24/17  Yes Mody, Ulice Bold, MD  mometasone-formoterol (DULERA) 100-5 MCG/ACT AERO Inhale 2 puffs into the lungs 2 (two) times daily. 01/24/17 06/13/17 Yes Bettey Costa, MD    No Known Allergies  Past Surgical History:  Procedure Laterality Date  . AMPUTATION Right 07/19/2015   Procedure: AMPUTATION DIGIT ( RIGHT FOOT FIFTH TOE );  Surgeon: Algernon Huxley, MD;  Location: [facility] ORS;  Service: Vascular;  Laterality: Right;  . BOWEL RESECTION  05/20/2015   Procedure: SMALL BOWEL RESECTION;  Surgeon: Clayburn Pert, MD;  Location: [facility] ORS;  Service: General;;  . CHOLECYSTECTOMY    . COLONOSCOPY  2000  . CYSTECTOMY W/ CONTINENT DIVERSION  1996  . HERNIA REPAIR    . LAPAROTOMY N/A 05/20/2015   Procedure: EXPLORATORY LAPAROTOMY;  Surgeon: Clayburn Pert, MD;  Location: [facility] ORS;  Service: General;  Laterality: N/A;  . PERIPHERAL VASCULAR CATHETERIZATION N/A 07/03/2015   Procedure: Abdominal Aortogram w/Lower Extremity;  Surgeon:  Algernon Huxley, MD;  Location: [place] [facility];  Service: Cardiovascular;  Laterality: N/A;  . PERIPHERAL VASCULAR CATHETERIZATION  07/03/2015   Procedure: Lower Extremity Intervention;  Surgeon: Algernon Huxley, MD;  Location: [place] [facility];  Service: Cardiovascular;;  . PROSTATECTOMY  1996  . TONSILLECTOMY      Social History   Tobacco Use  . Smoking status: Current Every Day Smoker    Packs/day: 2.00    Years: 57.00    Pack years: 114.00    Types: Cigarettes  . Smokeless tobacco: Never Used  . Tobacco comment: patient not ready to quit  Substance Use Topics  . Alcohol use: Yes    Alcohol/week: 8.4 oz    Types: 14 Standard drinks or equivalent per week    Comment: quit a few weeks away, before then 2/day, 11/03- 40 oz of beer a day   . Drug use: No     Medication list has been reviewed and updated.  PHQ 2/9 Scores 01/23/2017 01/19/2016 08/11/2015 08/30/2014  PHQ - 2 Score 0 1 0 0    Physical Exam  Constitutional: He is oriented to person, place, and time. He appears well-developed. No distress.  HENT:  Head: Normocephalic and atraumatic.  Cardiovascular: Normal rate, regular rhythm and normal heart sounds.  Pulmonary/Chest: Effort normal. No respiratory distress. He has decreased breath sounds. He has no wheezes. He has no rhonchi.  Abdominal: Normal appearance.  Musculoskeletal: Normal range of motion.  Neurological: He is alert and oriented to person, place, and time.  Skin: Skin is warm and dry. No rash noted.  Psychiatric: He has a normal mood and affect. His speech is normal and behavior is normal. Thought content normal.  Nursing note and vitals reviewed.   BP 124/68   Pulse 88   Ht 5\' 8"  (1.727 m)   Wt 181 lb (82.1 kg)   SpO2 95%   BMI 27.52 kg/m   Assessment and Plan: 1. Panlobular emphysema (HCC) Resume nebulizer tid - CBC with Differential/Platelet  2. Elevated blood sugar Encourage low carb, healthy diet - Hemoglobin A1c  3. Compulsive  tobacco user syndrome Stressed need to cut back significantly and stop completely  4. Absence of toe of right foot ([place]) persistent  5. Peripheral vascular disease ([place]) Claudication unchanged due to continued tobacco use  6. Paroxysmal atrial fibrillation (HCC) In SR today; unsure if this is contributing to SOB - Comprehensive metabolic panel   No orders of the defined types were placed in this encounter.   Partially dictated using Editor, commissioning. Any errors are unintentional.  Halina Maidens, MD [facility]  06/13/2017

## 2017-06-14 LAB — CBC WITH DIFFERENTIAL/PLATELET
BASOS: 0 %
Basophils Absolute: 0 10*3/uL (ref 0.0–0.2)
EOS (ABSOLUTE): 0.1 10*3/uL (ref 0.0–0.4)
Eos: 1 %
HEMOGLOBIN: 15.4 g/dL (ref 13.0–17.7)
Hematocrit: 45.5 % (ref 37.5–51.0)
Immature Grans (Abs): 0 10*3/uL (ref 0.0–0.1)
Immature Granulocytes: 1 %
LYMPHS ABS: 1.2 10*3/uL (ref 0.7–3.1)
Lymphs: 21 %
MCH: 32.4 pg (ref 26.6–33.0)
MCHC: 33.8 g/dL (ref 31.5–35.7)
MCV: 96 fL (ref 79–97)
MONOCYTES: 6 %
Monocytes Absolute: 0.3 10*3/uL (ref 0.1–0.9)
NEUTROS ABS: 4.1 10*3/uL (ref 1.4–7.0)
Neutrophils: 71 %
Platelets: 162 10*3/uL (ref 150–379)
RBC: 4.76 x10E6/uL (ref 4.14–5.80)
RDW: 13.3 % (ref 12.3–15.4)
WBC: 5.7 10*3/uL (ref 3.4–10.8)

## 2017-06-14 LAB — COMPREHENSIVE METABOLIC PANEL
ALBUMIN: 4.1 g/dL (ref 3.5–4.8)
ALK PHOS: 64 IU/L (ref 39–117)
ALT: 13 IU/L (ref 0–44)
AST: 11 IU/L (ref 0–40)
Albumin/Globulin Ratio: 1.6 (ref 1.2–2.2)
BILIRUBIN TOTAL: 0.4 mg/dL (ref 0.0–1.2)
BUN / CREAT RATIO: 19 (ref 10–24)
BUN: 18 mg/dL (ref 8–27)
CHLORIDE: 101 mmol/L (ref 96–106)
CO2: 26 mmol/L (ref 20–29)
CREATININE: 0.95 mg/dL (ref 0.76–1.27)
Calcium: 9 mg/dL (ref 8.6–10.2)
GFR calc non Af Amer: 80 mL/min/{1.73_m2} (ref >59)
GFR, EST AFRICAN AMERICAN: 93 mL/min/{1.73_m2} (ref >59)
GLOBULIN, TOTAL: 2.6 g/dL (ref 1.5–4.5)
Glucose: 133 mg/dL — ABNORMAL HIGH (ref 65–99)
Potassium: 3.8 mmol/L (ref 3.5–5.2)
SODIUM: 142 mmol/L (ref 134–144)
TOTAL PROTEIN: 6.7 g/dL (ref 6.0–8.5)

## 2017-06-14 LAB — HEMOGLOBIN A1C
Est. average glucose Bld gHb Est-mCnc: 105 mg/dL
Hgb A1c MFr Bld: 5.3 % (ref 4.8–5.6)

## 2017-07-10 ENCOUNTER — Ambulatory Visit (INDEPENDENT_AMBULATORY_CARE_PROVIDER_SITE_OTHER): Payer: Medicare PPO

## 2017-07-10 VITALS — BP 138/70 | HR 66 | Temp 98.0°F | Resp 14 | Ht 68.0 in | Wt 175.6 lb

## 2017-07-10 DIAGNOSIS — Z1211 Encounter for screening for malignant neoplasm of colon: Secondary | ICD-10-CM

## 2017-07-10 DIAGNOSIS — Z0000 Encounter for general adult medical examination without abnormal findings: Secondary | ICD-10-CM

## 2017-07-10 NOTE — Progress Notes (Addendum)
Subjective:   Brady Smith is a 72 y.o. male who presents for Medicare Annual/Subsequent preventive examination.  Review of Systems:  N/A Cardiac Risk Factors include: dyslipidemia;hypertension;advanced age (>65men, >22 women);male gender;smoking/ tobacco exposure;family history of premature cardiovascular disease     Objective:    Vitals: BP 138/70 (BP Location: Right Arm, Patient Position: Sitting, Cuff Size: Normal)   Pulse 66   Temp 98 F (36.7 C) (Oral)   Resp 14   Ht 5\' 8"  (1.727 m)   Wt 175 lb 9.6 oz (79.7 kg)   SpO2 94%   BMI 26.70 kg/m   Body mass index is 26.7 kg/m.  Advanced Directives 07/10/2017 01/23/2017 01/23/2017 01/26/2016 08/22/2015 07/19/2015 07/18/2015  Does Patient Have a Medical Advance Directive? Yes No No No No No No  Type of Paramedic of [place];Living will - - - - - -  Does patient want to make changes to medical advance directive? - - - - - - -  Copy of [place] in Chart? No - copy requested - - - - - -  Would patient like information on creating a medical advance directive? - No - Patient declined - Yes - Educational materials given - No - patient declined information No - patient declined information    Tobacco Social History   Tobacco Use  Smoking Status Current Every Day Smoker  . Packs/day: 1.00  . Years: 65.00  . Pack years: 65.00  . Types: Cigarettes  Smokeless Tobacco Never Used  Tobacco Comment   reduced # of packs smoked to 1 ppd      Ready to quit: Yes Counseling given: Yes Comment: reduced # of packs smoked to 1 ppd    Clinical Intake:  Pre-visit preparation completed: Yes  Pain : No/denies pain   BMI - recorded: 26.7 Nutritional Status: BMI 25 -29 Overweight Nutritional Risks: None Diabetes: No  How often do you need to have someone help you when you read instructions, pamphlets, or other written materials from your doctor or pharmacy?: 1 - Never  Interpreter Needed?:  No  Information entered by :: AEversole, LPN  Past Medical History:  Diagnosis Date  . Anxiety   . Bladder cancer ([place])   . COPD (chronic obstructive pulmonary disease) ([place])   . Depression   . GERD (gastroesophageal reflux disease)   . History of kidney cancer   . Peripheral vascular disease ([place])   . Pneumonia    Past Surgical History:  Procedure Laterality Date  . AMPUTATION Right 07/19/2015   Procedure: AMPUTATION DIGIT ( RIGHT FOOT FIFTH TOE );  Surgeon: Algernon Huxley, MD;  Location: [facility];  Service: Vascular;  Laterality: Right;  . BOWEL RESECTION  05/20/2015   Procedure: SMALL BOWEL RESECTION;  Surgeon: Clayburn Pert, MD;  Location: [facility];  Service: General;;  . CHOLECYSTECTOMY    . COLONOSCOPY  2000  . CYSTECTOMY W/ CONTINENT DIVERSION  1996  . HERNIA REPAIR    . LAPAROTOMY N/A 05/20/2015   Procedure: EXPLORATORY LAPAROTOMY;  Surgeon: Clayburn Pert, MD;  Location: [facility];  Service: General;  Laterality: N/A;  . PERIPHERAL VASCULAR CATHETERIZATION N/A 07/03/2015   Procedure: Abdominal Aortogram w/Lower Extremity;  Surgeon: Algernon Huxley, MD;  Location: [facility];  Service: Cardiovascular;  Laterality: N/A;  . PERIPHERAL VASCULAR CATHETERIZATION  07/03/2015   Procedure: Lower Extremity Intervention;  Surgeon: Algernon Huxley, MD;  Location: [name];  Service: Cardiovascular;;  . PROSTATECTOMY  1996  . TONSILLECTOMY     Family History  Problem Relation Age of Onset  . Heart disease Mother   . Heart disease Father   . COPD Sister    Social History   Socioeconomic History  . Marital status: Widowed    Spouse name: Not on file  . Number of children: 2  . Years of education: Not on file  . Highest education level: 8th grade  Occupational History  . Occupation: disabled  Social Needs  . Financial resource strain: Not hard at all  . Food insecurity:    Worry: Never true    Inability: Never true  . Transportation needs:    Medical: No     Non-medical: No  Tobacco Use  . Smoking status: Current Every Day Smoker    Packs/day: 1.00    Years: 65.00    Pack years: 65.00    Types: Cigarettes  . Smokeless tobacco: Never Used  . Tobacco comment: reduced # of packs smoked to 1 ppd   Substance and Sexual Activity  . Alcohol use: Yes    Alcohol/week: 1.2 oz    Types: 2 Cans of beer per week  . Drug use: No  . Sexual activity: Not Currently  Lifestyle  . Physical activity:    Days per week: 7 days    Minutes per session: 30 min  . Stress: Not at all  Relationships  . Social connections:    Talks on phone: Patient refused    Gets together: Patient refused    Attends religious service: Patient refused    Active member of club or organization: Patient refused    Attends meetings of clubs or organizations: Patient refused    Relationship status: Widowed  Other Topics Concern  . Not on file  Social History Narrative  . Not on file    Outpatient Encounter Medications as of 07/10/2017  Medication Sig  . aspirin EC 81 MG tablet Take 81 mg by mouth every other day.  . ipratropium-albuterol (DUONEB) 0.5-2.5 (3) MG/3ML SOLN Take 3 mLs by nebulization every 6 (six) hours as needed (shortness of breath).  . mometasone-formoterol (DULERA) 100-5 MCG/ACT AERO Inhale 2 puffs into the lungs 2 (two) times daily.  . Turmeric 500 MG CAPS Take 1 tablet by mouth daily.   No facility-administered encounter medications on file as of 07/10/2017.     Activities of Daily Living In your present state of health, do you have any difficulty performing the following activities: 07/10/2017 01/23/2017  Hearing? N N  Comment denies hearing aids -  Vision? N N  Comment denies eyeglasses -  Difficulty concentrating or making decisions? Y N  Comment short term memory loss -  Walking or climbing stairs? Y N  Comment dyspnea -  Dressing or bathing? N N  Doing errands, shopping? N Y  Conservation officer, nature and eating ? N -  Comment denies dentures -  Using  the Toilet? N -  In the past six months, have you accidently leaked urine? N -  Do you have problems with loss of bowel control? N -  Managing your Medications? N -  Managing your Finances? N -  Housekeeping or managing your Housekeeping? N -  Some recent data might be hidden    Patient Care Team: Glean Hess, MD as PCP - General (Family Medicine)   Assessment:   This is a routine wellness examination for [place].  Exercise Activities and Dietary recommendations Current Exercise Habits: Home exercise routine,  Type of exercise: walking, Time (Minutes): 30, Frequency (Times/Week): 7, Weekly Exercise (Minutes/Week): 210, Intensity: Mild, Exercise limited by: None identified  Goals    . DIET - INCREASE WATER INTAKE     Recommend to drink at least 6-8 8oz glasses of water per day.       Fall Risk Fall Risk  07/10/2017 01/23/2017 01/19/2016 08/11/2015 08/30/2014  Falls in the past year? No No No No No  Risk for fall due to : History of fall(s) - Impaired balance/gait;Impaired mobility - -  Risk for fall due to: Comment inner ear pain and dyspnea - - - -   Is the home free of loose throw rugs in walkways, pet beds, electrical cords, etc? Yes Adequate lighting to reduce risk of falls?  Yes In addition, does the patient have any of the following: Stairs in or around the home WITH handrails? No Grab bars in the bathroom? No  Shower chair or a place to sit while bathing? No Use of an elevated toilet seat or a handicapped toilet? No Use of a cane, walker or w/c? No  Timed Get Up and Go Performed: Yes. Pt ambulated 10 feet within 7 sec. Gait stead-fast and without the use of an assistive device. No intervention required at this time. Fall risk prevention has been discussed.  Pt declined my offer to send Community Resource Referral to Care Guide for installation of grab bars in the shower, shower chair or an elevated toilet seat.  Depression Screen PHQ 2/9 Scores 07/10/2017 01/23/2017  01/19/2016 08/11/2015  PHQ - 2 Score 0 0 1 0  PHQ- 9 Score 0 - - -    Cognitive Function     6CIT Screen 07/10/2017  What Year? 0 points  What month? 0 points  What time? 0 points  Count back from 20 0 points  Months in reverse 4 points  Repeat phrase 2 points  Total Score 6    Immunization History  Administered Date(s) Administered  . Influenza, High Dose Seasonal PF 01/23/2017  . Pneumococcal Polysaccharide-23 01/24/2017    Qualifies for Shingles Vaccine? Yes. Due for Zostavax or Shingrix vaccine. Education has been provided regarding the importance of this vaccine. Pt has been advised to call his insurance company to determine his out of pocket expense. Advised he may also receive this vaccine at his local pharmacy or Health Dept. Verbalized acceptance and understanding.  Due for Tdap vaccine. Education has been provided regarding the importance of this vaccine. Pt has been advised he may receive this vaccine at his local pharmacy or Health Dept. Also advised to provide a copy of his vaccination record if he chooses to receive this vaccine at his local pharmacy. Verbalized acceptance and understanding.  Screening Tests Health Maintenance  Topic Date Due  . COLONOSCOPY  03/26/2008  . TETANUS/TDAP  03/25/2018 (Originally 11/08/1964)  . INFLUENZA VACCINE  10/23/2017  . PNA vac Low Risk Adult (2 of 2 - PCV13) 01/24/2018  . Hepatitis C Screening  Completed   Cancer Screenings: Lung: Low Dose CT Chest recommended if Age 84-80 years, 30 pack-year currently smoking OR have quit w/in 15years. Patient does not qualify. Colorectal: Referred to Dr. Allen Norris by Dr. [name] on 01/16/15. Unable to locate a colonoscopy report. Pt referred to GI for completion. Message sent to referral coordinator for scheduling purposes. Pt aware he will receive a call from our office re: his appt.  Additional Screenings: Hepatitis C Screening: Completed 03/06/17     Plan:  I have  personally reviewed and  addressed the Medicare Annual Wellness questionnaire and have noted the following in the patient's chart:  A. Medical and social history B. Use of alcohol, tobacco or illicit drugs  C. Current medications and supplements D. Functional ability and status E.  Nutritional status F.  Physical activity G. Advance directives H. List of other physicians I.  Hospitalizations, surgeries, and ER visits in previous 12 months J.  [place] such as hearing and vision if needed, cognitive and depression L. Referrals and appointments  In addition, I have reviewed and discussed with patient certain preventive protocols, quality metrics, and best practice recommendations. A written personalized care plan for preventive services as well as general preventive health recommendations were provided to patient.  Signed,  Aleatha Borer, LPN Nurse Health Advisor  MD Recommendations: Due for Zostavax or Shingrix vaccine. Education has been provided regarding the importance of this vaccine. Pt has been advised to call his insurance company to determine his out of pocket expense. Advised he may also receive this vaccine at his local pharmacy or Health Dept. Verbalized acceptance and understanding.  Due for Tdap vaccine. Education has been provided regarding the importance of this vaccine. Pt has been advised he may receive this vaccine at his local pharmacy or Health Dept. Also advised to provide a copy of his vaccination record if he chooses to receive this vaccine at his local pharmacy. Verbalized acceptance and understanding.  Colorectal: Referred to Dr. Allen Norris by Dr. [name] on 01/16/15. Unable to locate a colonoscopy report. Pt referred to GI for completion. Message sent to referral coordinator for scheduling purposes. Pt aware he will receive a call from our office re: his appt.

## 2017-07-10 NOTE — Patient Instructions (Addendum)
[name] , Thank you for taking time to come for your Medicare Wellness Visit. I appreciate your ongoing commitment to your health goals. Please review the following plan we discussed and let me know if I can assist you in the future.   Screening recommendations/referrals: Colorectal Screening: Referred to GI today. You will receive a call from our office regarding your appointment.  Vision and Dental Exams: Recommended annual ophthalmology exams for early detection of glaucoma and other disorders of the eye Recommended annual dental exams for proper oral hygiene  Vaccinations: Influenza vaccine: Up to date Pneumococcal vaccine: Up to date Tdap vaccine: Declined. Please call your insurance company to determine your out of pocket expense. You may also receive this vaccine at your local pharmacy or Health Dept. Shingles vaccine: Please call your insurance company to determine your out of pocket expense for the Shingrix vaccine. You may also receive this vaccine at your local pharmacy or Health Dept.  Advanced directives: Please bring a copy of your POA (Power of Attorney) and/or Living Will to your next appointment.  Conditions/risks identified: Recommend to drink at least 6-8 8oz glasses of water per day.  Next appointment: Please schedule your Annual Wellness Visit with your Nurse Health Advisor in one year.  Preventive Care 72 Years and Older, Male Preventive care refers to lifestyle choices and visits with your health care provider that can promote health and wellness. What does preventive care include?  A yearly physical exam. This is also called an annual well check.  Dental exams once or twice a year.  Routine eye exams. Ask your health care provider how often you should have your eyes checked.  Personal lifestyle choices, including:  Daily care of your teeth and gums.  Regular physical activity.  Eating a healthy diet.  Avoiding tobacco and drug use.  Limiting  alcohol use.  Practicing safe sex.  Taking low doses of aspirin every day.  Taking vitamin and mineral supplements as recommended by your health care provider. What happens during an annual well check? The services and screenings done by your health care provider during your annual well check will depend on your age, overall health, lifestyle risk factors, and family history of disease. Counseling  Your health care provider may ask you questions about your:  Alcohol use.  Tobacco use.  Drug use.  Emotional well-being.  Home and relationship well-being.  Sexual activity.  Eating habits.  History of falls.  Memory and ability to understand (cognition).  Work and work Statistician. Screening  You may have the following tests or measurements:  Height, weight, and BMI.  Blood pressure.  Lipid and cholesterol levels. These may be checked every 5 years, or more frequently if you are over 72 years old.  Skin check.  Lung cancer screening. You may have this screening every year starting at age 72 if you have a 30-pack-year history of smoking and currently smoke or have quit within the past 15 years.  Fecal occult blood test (FOBT) of the stool. You may have this test every year starting at age 72.  Flexible sigmoidoscopy or colonoscopy. You may have a sigmoidoscopy every 5 years or a colonoscopy every 10 years starting at age 72.  Prostate cancer screening. Recommendations will vary depending on your family history and other risks.  Hepatitis C blood test.  Hepatitis B blood test.  Sexually transmitted disease (STD) testing.  Diabetes screening. This is done by checking your blood sugar (glucose) after you have not eaten for  a while (fasting). You may have this done every 1-3 years.  Abdominal aortic aneurysm (AAA) screening. You may need this if you are a current or former smoker.  Osteoporosis. You may be screened starting at age 72 if you are at high risk. Talk  with your health care provider about your test results, treatment options, and if necessary, the need for more tests. Vaccines  Your health care provider may recommend certain vaccines, such as:  Influenza vaccine. This is recommended every year.  Tetanus, diphtheria, and acellular pertussis (Tdap, Td) vaccine. You may need a Td booster every 10 years.  Zoster vaccine. You may need this after age 72.  Pneumococcal 13-valent conjugate (PCV13) vaccine. One dose is recommended after age 72.  Pneumococcal polysaccharide (PPSV23) vaccine. One dose is recommended after age 72. Talk to your health care provider about which screenings and vaccines you need and how often you need them. This information is not intended to replace advice given to you by your health care provider. Make sure you discuss any questions you have with your health care provider. Document Released: 04/07/2015 Document Revised: 11/29/2015 Document Reviewed: 01/10/2015 Elsevier Interactive Patient Education  2017 [place] Prevention in the Home Falls can cause injuries. They can happen to people of all ages. There are many things you can do to make your home safe and to help prevent falls. What can I do on the outside of my home?  Regularly fix the edges of walkways and driveways and fix any cracks.  Remove anything that might make you trip as you walk through a door, such as a raised step or threshold.  Trim any bushes or trees on the path to your home.  Use bright outdoor lighting.  Clear any walking paths of anything that might make someone trip, such as rocks or tools.  Regularly check to see if handrails are loose or broken. Make sure that both sides of any steps have handrails.  Any raised decks and porches should have guardrails on the edges.  Have any leaves, snow, or ice cleared regularly.  Use sand or salt on walking paths during winter.  Clean up any spills in your garage right away. This  includes oil or grease spills. What can I do in the bathroom?  Use night lights.  Install grab bars by the toilet and in the tub and shower. Do not use towel bars as grab bars.  Use non-skid mats or decals in the tub or shower.  If you need to sit down in the shower, use a plastic, non-slip stool.  Keep the floor dry. Clean up any water that spills on the floor as soon as it happens.  Remove soap buildup in the tub or shower regularly.  Attach bath mats securely with double-sided non-slip rug tape.  Do not have throw rugs and other things on the floor that can make you trip. What can I do in the bedroom?  Use night lights.  Make sure that you have a light by your bed that is easy to reach.  Do not use any sheets or blankets that are too big for your bed. They should not hang down onto the floor.  Have a firm chair that has side arms. You can use this for support while you get dressed.  Do not have throw rugs and other things on the floor that can make you trip. What can I do in the kitchen?  Clean up any spills right away.  Avoid walking on wet floors.  Keep items that you use a lot in easy-to-reach places.  If you need to reach something above you, use a strong step stool that has a grab bar.  Keep electrical cords out of the way.  Do not use floor polish or wax that makes floors slippery. If you must use wax, use non-skid floor wax.  Do not have throw rugs and other things on the floor that can make you trip. What can I do with my stairs?  Do not leave any items on the stairs.  Make sure that there are handrails on both sides of the stairs and use them. Fix handrails that are broken or loose. Make sure that handrails are as long as the stairways.  Check any carpeting to make sure that it is firmly attached to the stairs. Fix any carpet that is loose or worn.  Avoid having throw rugs at the top or bottom of the stairs. If you do have throw rugs, attach them to the  floor with carpet tape.  Make sure that you have a light switch at the top of the stairs and the bottom of the stairs. If you do not have them, ask someone to add them for you. What else can I do to help prevent falls?  Wear shoes that:  Do not have high heels.  Have rubber bottoms.  Are comfortable and fit you well.  Are closed at the toe. Do not wear sandals.  If you use a stepladder:  Make sure that it is fully opened. Do not climb a closed stepladder.  Make sure that both sides of the stepladder are locked into place.  Ask someone to hold it for you, if possible.  Clearly mark and make sure that you can see:  Any grab bars or handrails.  First and last steps.  Where the edge of each step is.  Use tools that help you move around (mobility aids) if they are needed. These include:  Canes.  Walkers.  Scooters.  Crutches.  Turn on the lights when you go into a dark area. Replace any light bulbs as soon as they burn out.  Set up your furniture so you have a clear path. Avoid moving your furniture around.  If any of your floors are uneven, fix them.  If there are any pets around you, be aware of where they are.  Review your medicines with your doctor. Some medicines can make you feel dizzy. This can increase your chance of falling. Ask your doctor what other things that you can do to help prevent falls. This information is not intended to replace advice given to you by your health care provider. Make sure you discuss any questions you have with your health care provider. Document Released: 01/05/2009 Document Revised: 08/17/2015 Document Reviewed: 04/15/2014 Elsevier Interactive Patient Education  2017 [employer].

## 2017-07-14 ENCOUNTER — Encounter: Payer: Self-pay | Admitting: Internal Medicine

## 2017-07-14 ENCOUNTER — Ambulatory Visit: Payer: Medicare PPO | Admitting: Internal Medicine

## 2017-07-14 VITALS — BP 138/78 | HR 82 | Ht 68.0 in | Wt 175.0 lb

## 2017-07-14 DIAGNOSIS — J431 Panlobular emphysema: Secondary | ICD-10-CM

## 2017-07-14 DIAGNOSIS — I480 Paroxysmal atrial fibrillation: Secondary | ICD-10-CM | POA: Diagnosis not present

## 2017-07-14 DIAGNOSIS — I739 Peripheral vascular disease, unspecified: Secondary | ICD-10-CM

## 2017-07-14 DIAGNOSIS — F17200 Nicotine dependence, unspecified, uncomplicated: Secondary | ICD-10-CM | POA: Diagnosis not present

## 2017-07-14 DIAGNOSIS — R739 Hyperglycemia, unspecified: Secondary | ICD-10-CM | POA: Diagnosis not present

## 2017-07-14 NOTE — Progress Notes (Signed)
Date:  07/14/2017   Name:  Brady Smith   DOB:  Oct 17, 1945   MRN:  [id number]   Chief Complaint: COPD COPD - he has stopped using Dulera but continues to use the nebulizer.  He says that he is 90% better.  He denies chest pain, rapid heart rate, palpitations.  Nicotine Dependence  Presents for follow-up visit. Symptoms are negative for fatigue. His urge triggers include company of smokers. The symptoms have been stable. He smokes 1 pack of cigarettes per day.   PVD -  He has no ulcers or open areas on legs or feet.  He has some leg pain and heaviness and cramps with exertion but is very limited by COPD.  Elevated BS - A1C was normal.  Pt has tried to reduce sweets. Lab Results  Component Value Date   HGBA1C 5.3 06/13/2017    Review of Systems  Constitutional: Negative for chills, fatigue and fever.  HENT: Negative for trouble swallowing.   Respiratory: Positive for shortness of breath. Negative for cough, chest tightness and wheezing.   Cardiovascular: Negative for chest pain, palpitations and leg swelling.  Musculoskeletal: Positive for arthralgias.  Skin: Negative for rash.  Neurological: Negative for dizziness and headaches.  Hematological: Negative for adenopathy.  Psychiatric/Behavioral: Positive for sleep disturbance.    Patient Active Problem List   Diagnosis Date Noted  . Paroxysmal atrial fibrillation ([place]) 06/13/2017  . Elevated blood sugar 02/03/2017  . Elevated BP without diagnosis of hypertension 02/03/2017  . S/P amputation of lesser toe ([place]) 07/25/2015  . Peripheral vascular disease ([place]) 07/14/2015  . AA (alcohol abuse) 05/23/2015  . Urinary retention 05/18/2015  . Choroidal nevus, right eye 02/08/2015  . Carpal tunnel syndrome 01/13/2015  . H/O gastrointestinal disease 01/13/2015  . History of primary malignant neoplasm of urinary bladder 01/13/2015  . Compulsive tobacco user syndrome 01/13/2015  . HLD (hyperlipidemia) 01/13/2015  . Neurosis, phobic  01/13/2015  . Chronic obstructive pulmonary disease ([place]) 06/11/2011  . Lung nodule, solitary 06/11/2011    Prior to Admission medications   Medication Sig Start Date End Date Taking? Authorizing Provider  aspirin EC 81 MG tablet Take 81 mg by mouth every other day.    [provider]  ipratropium-albuterol (DUONEB) 0.5-2.5 (3) MG/3ML SOLN Take 3 mLs by nebulization every 6 (six) hours as needed (shortness of breath). 01/24/17   Bettey Costa, MD  mometasone-formoterol (DULERA) 100-5 MCG/ACT AERO Inhale 2 puffs into the lungs 2 (two) times daily. 01/24/17 07/10/17  Bettey Costa, MD  Turmeric 500 MG CAPS Take 1 tablet by mouth daily.    [provider]    No Known Allergies  Past Surgical History:  Procedure Laterality Date  . AMPUTATION Right 07/19/2015   Procedure: AMPUTATION DIGIT ( RIGHT FOOT FIFTH TOE );  Surgeon: Algernon Huxley, MD;  Location: [facility];  Service: Vascular;  Laterality: Right;  . BOWEL RESECTION  05/20/2015   Procedure: SMALL BOWEL RESECTION;  Surgeon: Clayburn Pert, MD;  Location: [facility];  Service: General;;  . CHOLECYSTECTOMY    . COLONOSCOPY  2000  . CYSTECTOMY W/ CONTINENT DIVERSION  1996  . HERNIA REPAIR    . LAPAROTOMY N/A 05/20/2015   Procedure: EXPLORATORY LAPAROTOMY;  Surgeon: Clayburn Pert, MD;  Location: [facility];  Service: General;  Laterality: N/A;  . PERIPHERAL VASCULAR CATHETERIZATION N/A 07/03/2015   Procedure: Abdominal Aortogram w/Lower Extremity;  Surgeon: Algernon Huxley, MD;  Location: [facility];  Service: Cardiovascular;  Laterality:  N/A;  . PERIPHERAL VASCULAR CATHETERIZATION  07/03/2015   Procedure: Lower Extremity Intervention;  Surgeon: Algernon Huxley, MD;  Location: [facility];  Service: Cardiovascular;;  . PROSTATECTOMY  1996  . TONSILLECTOMY      Social History   Tobacco Use  . Smoking status: Current Every Day Smoker    Packs/day: 1.00    Years: 65.00    Pack years: 65.00    Types: Cigarettes  .  Smokeless tobacco: Never Used  . Tobacco comment: reduced # of packs smoked to 1 ppd   Substance Use Topics  . Alcohol use: Yes    Alcohol/week: 1.2 oz    Types: 2 Cans of beer per week  . Drug use: No     Medication list has been reviewed and updated.  PHQ 2/9 Scores 07/10/2017 01/23/2017 01/19/2016 08/11/2015  PHQ - 2 Score 0 0 1 0  PHQ- 9 Score 0 - - -    Physical Exam  Constitutional: He is oriented to person, place, and time. He appears well-developed. No distress.  HENT:  Head: Normocephalic and atraumatic.  Cardiovascular: Normal rate, regular rhythm and normal heart sounds. Exam reveals no gallop.  Pulses:      Dorsalis pedis pulses are 1+ on the right side, and 1+ on the left side.       Posterior tibial pulses are 0 on the right side, and 0 on the left side.  Pulmonary/Chest: Effort normal. No respiratory distress. He has decreased breath sounds. He has no wheezes. He has no rhonchi.  Abdominal: Soft. Normal appearance.  Musculoskeletal: Normal range of motion.  Neurological: He is alert and oriented to person, place, and time.  Skin: Skin is warm and dry. No rash noted.  Psychiatric: He has a normal mood and affect. His behavior is normal. Thought content normal.    BP 138/78   Pulse 82   Ht 5\' 8"  (1.727 m)   Wt 175 lb (79.4 kg)   SpO2 96%   BMI 26.61 kg/m   Assessment and Plan: 1. Panlobular emphysema ([place]) Continue nebulizer tid Continue to work on reducing smoking  2. Paroxysmal atrial fibrillation (H[facility]) In SR today Continue aspirin  3. Peripheral vascular disease ([place]) stable  4. Elevated blood sugar DM ruled out with normal A1C Encouraged pt to reduce carbohydrate intake  5. Compulsive tobacco user syndrome Tobacco cessation discussed - he wants to cut back gradually  No orders of the defined types were placed in this encounter.   Partially dictated using Editor, commissioning. Any errors are unintentional.  Halina Maidens, MD [facility]  07/14/2017

## 2017-07-18 IMAGING — CR DG ABDOMEN 2V
3 series · 3 of 3 positions shown · non-contrast
Comparison: 05/18/2015

CLINICAL DATA: Ventral hernia with obstruction

EXAM:
ABDOMEN - 2 VIEW

[abdomen erect]
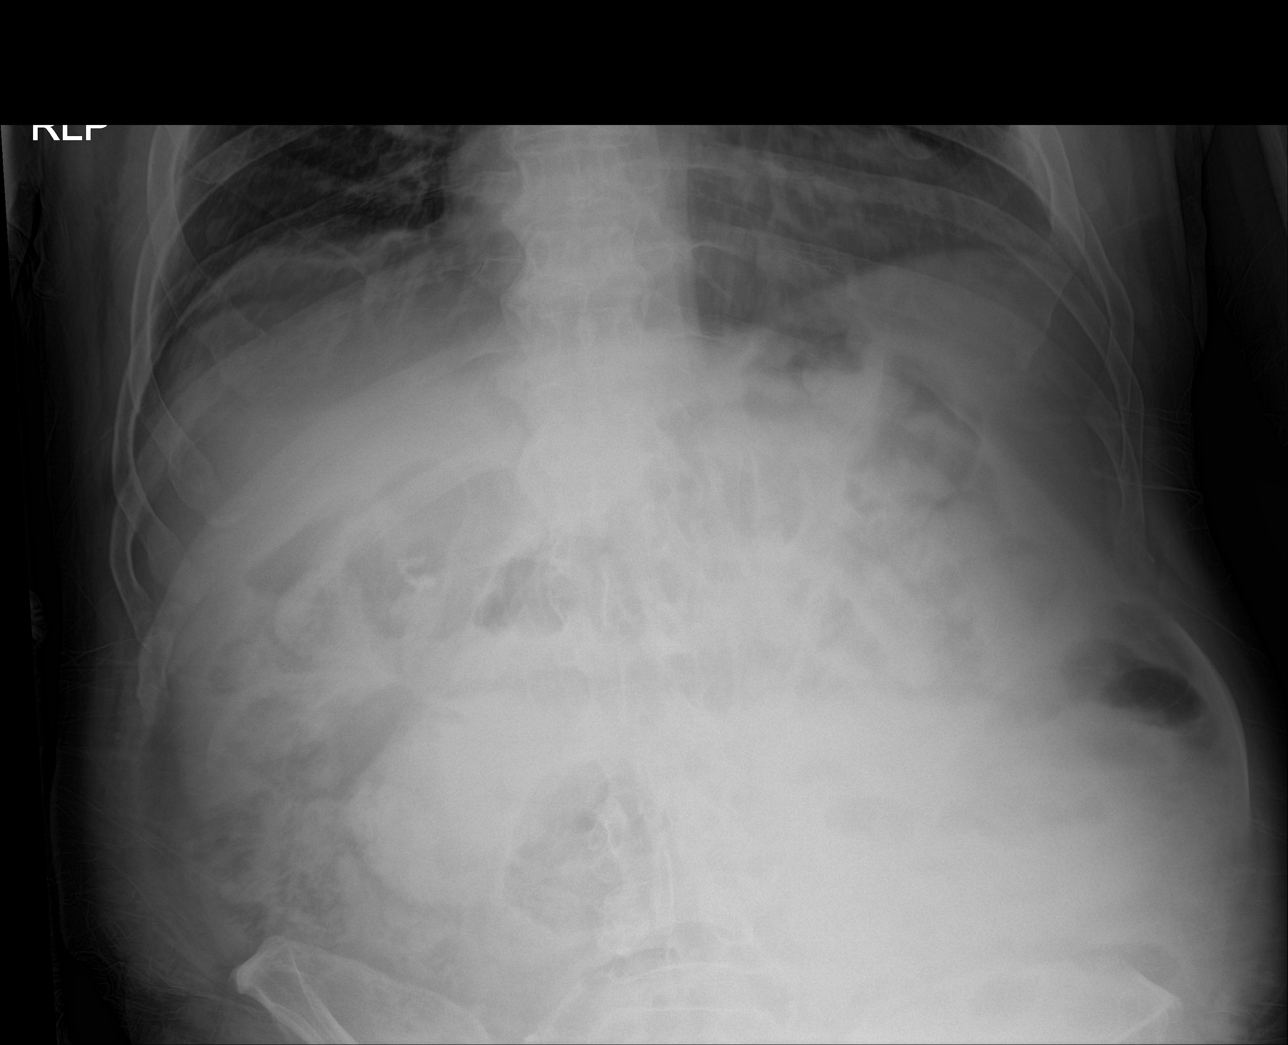

[abdomen supine (1 of 2)]
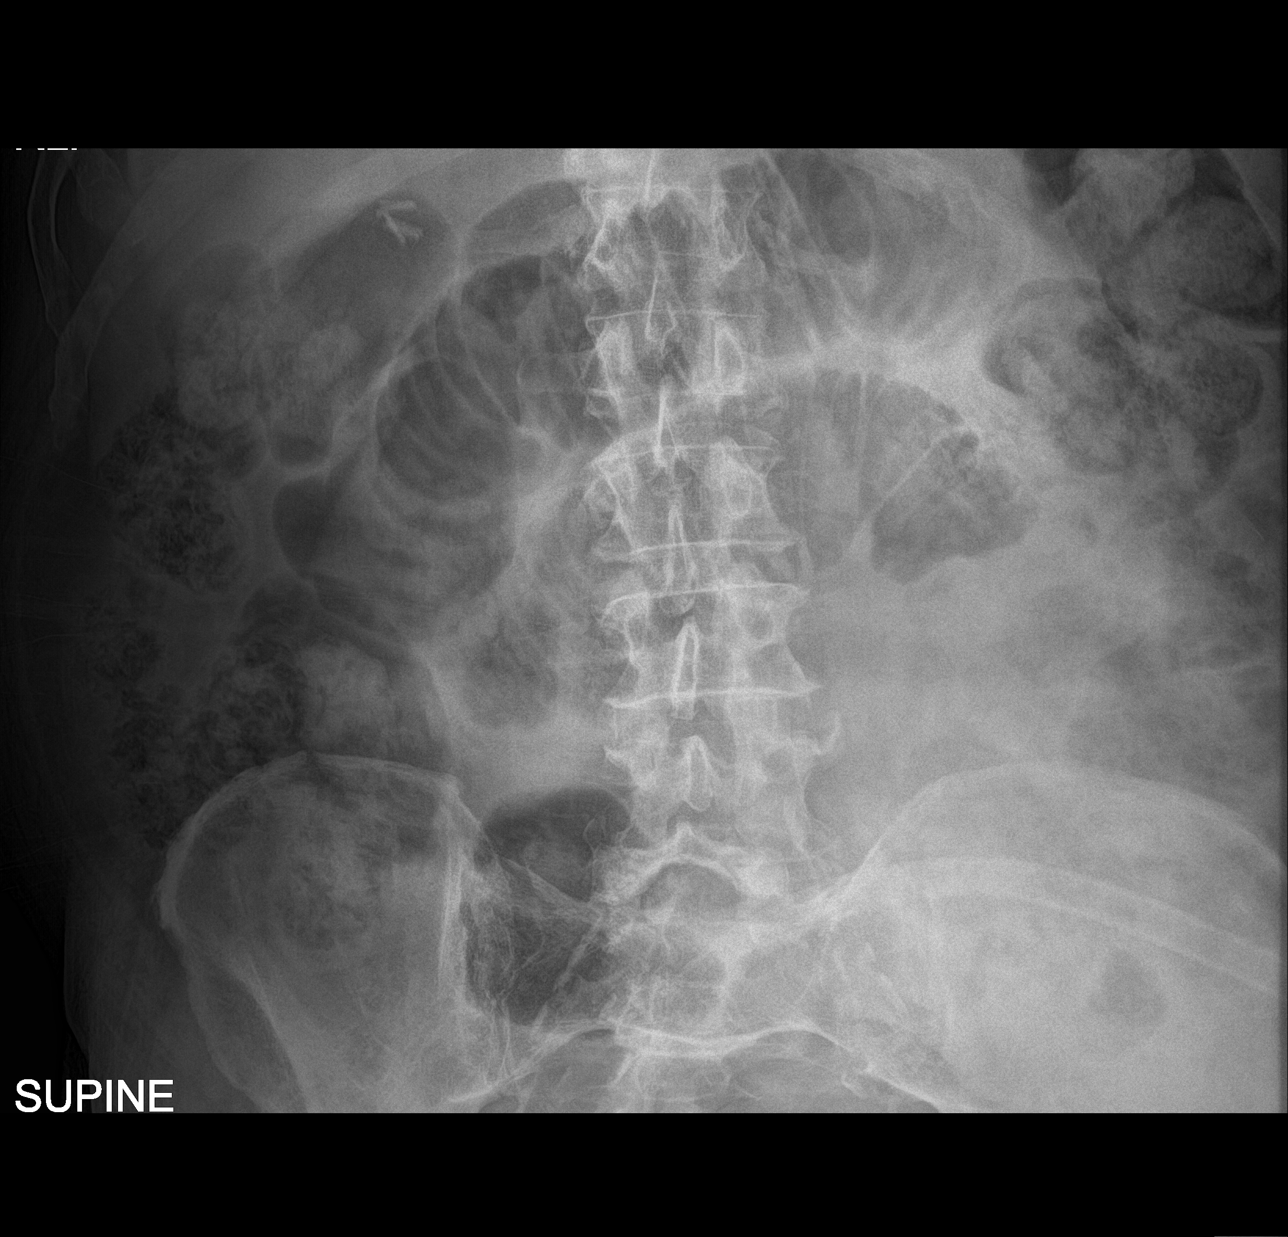

[abdomen supine (2 of 2)]
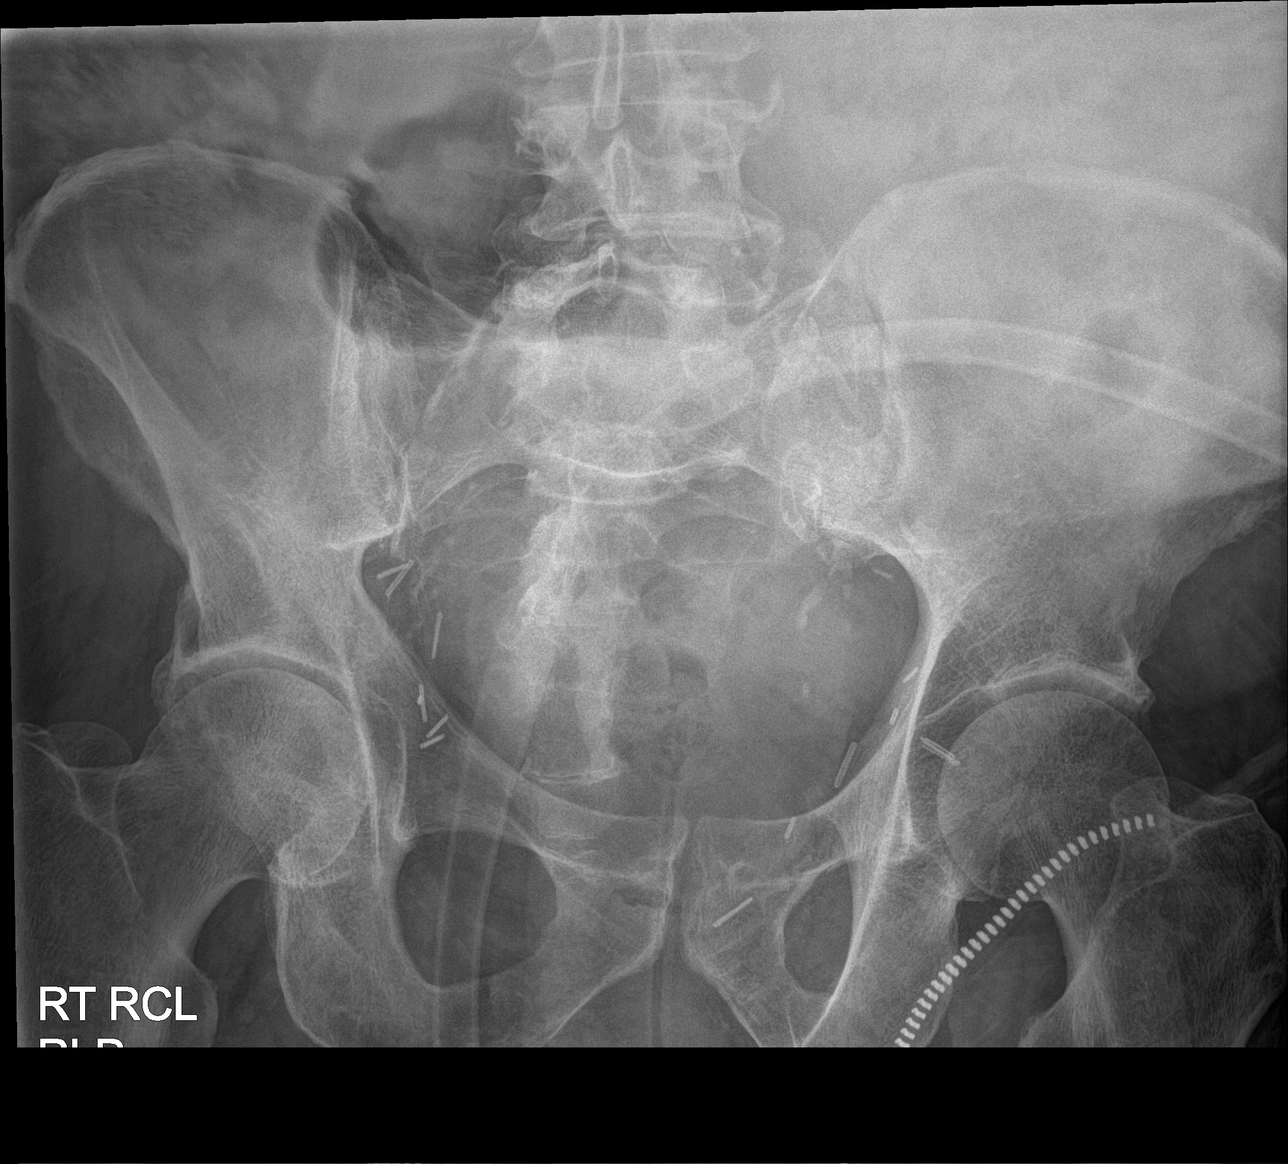

[3 of 3 positions shown; findings below may reference images not displayed]

FINDINGS: NG tube has been removed. Again noted gaseous distended small bowel
loops mid abdomen suspicious for ileus or partial bowel obstruction.
Postcholecystectomy surgical clips. Moderate stool and gas noted in
right colon and splenic flexure of the colon. Surgical clips are
noted within pelvis. No evidence of free abdominal air.
IMPRESSION: Again noted gaseous distended small bowel loops mid abdomen
suspicious for ileus or partial bowel obstruction.
Postcholecystectomy surgical clips. Moderate stool and gas noted in
right colon and splenic flexure of the colon.

## 2017-07-19 IMAGING — CR DG ABDOMEN 2V
3 series · 3 of 3 positions shown · non-contrast
Comparison: 05/19/2015 and prior radiographs.  05/17/2015 CT

CLINICAL DATA: 69-year-old male -followup small bowel obstruction.

EXAM:
ABDOMEN - 2 VIEW

[abdomen erect (1 of 2)]
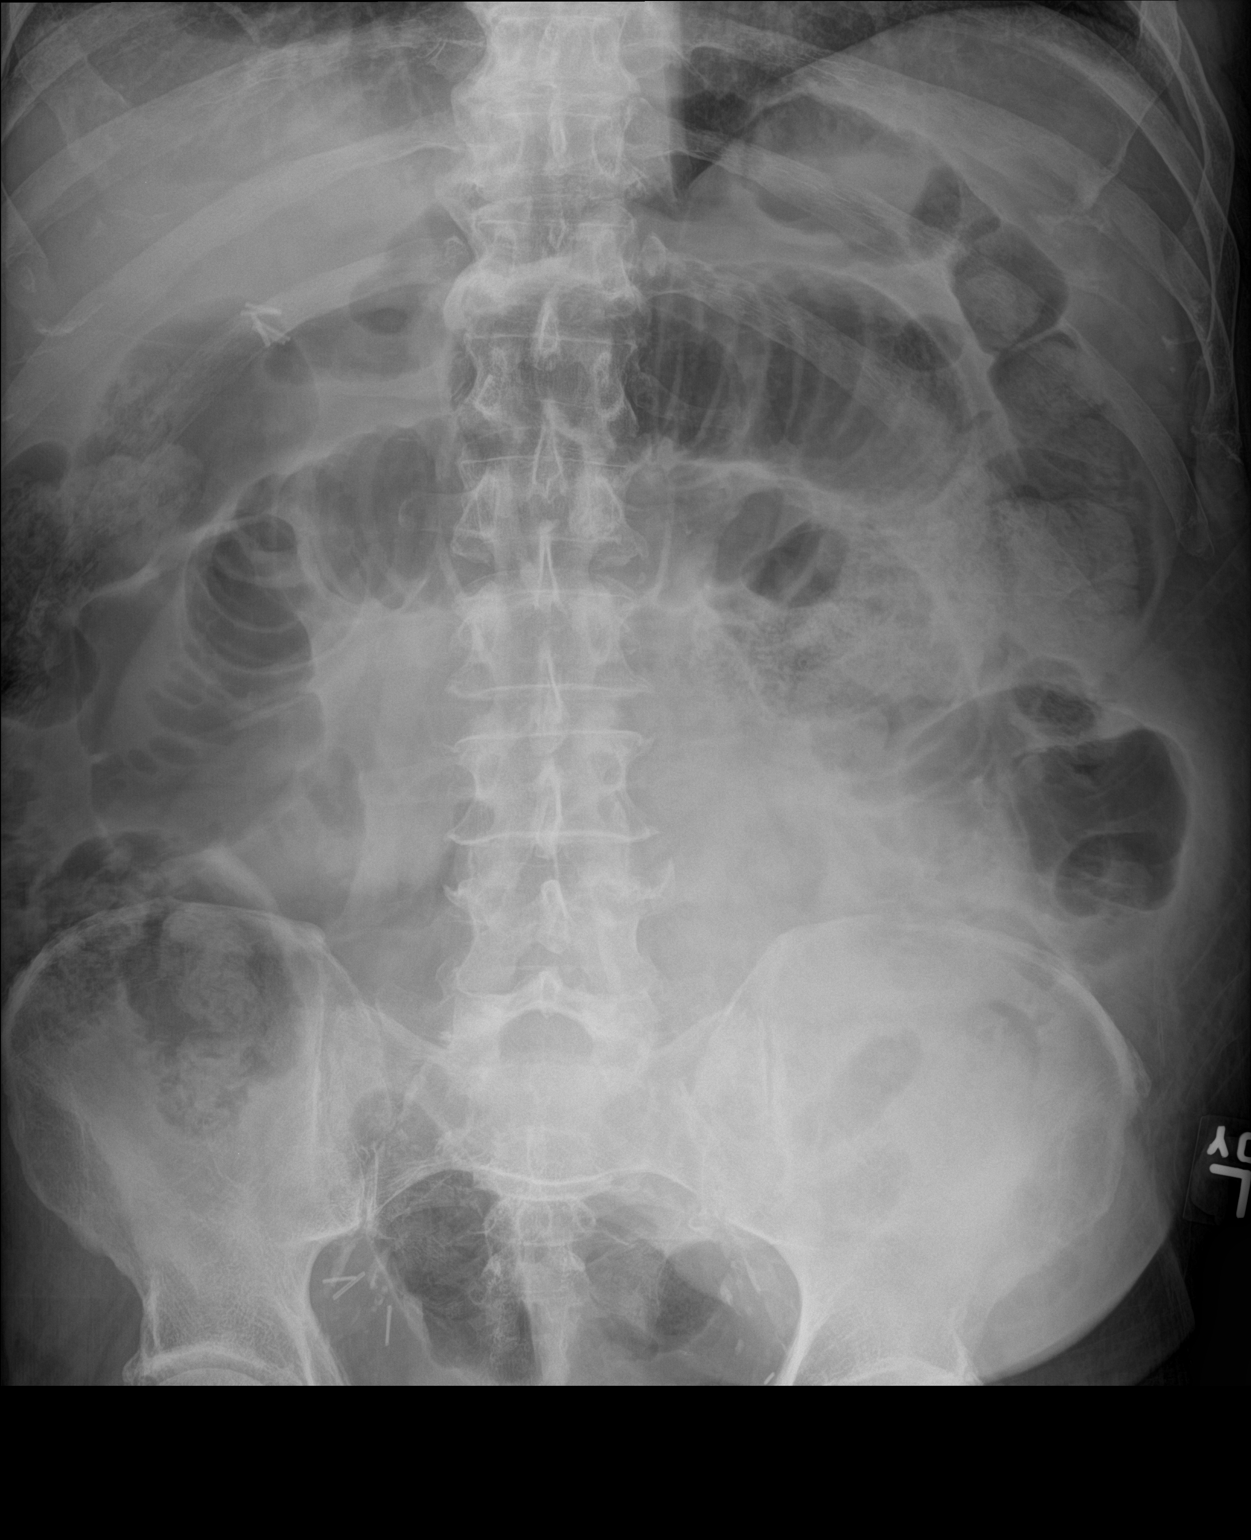

[abdomen erect (2 of 2)]
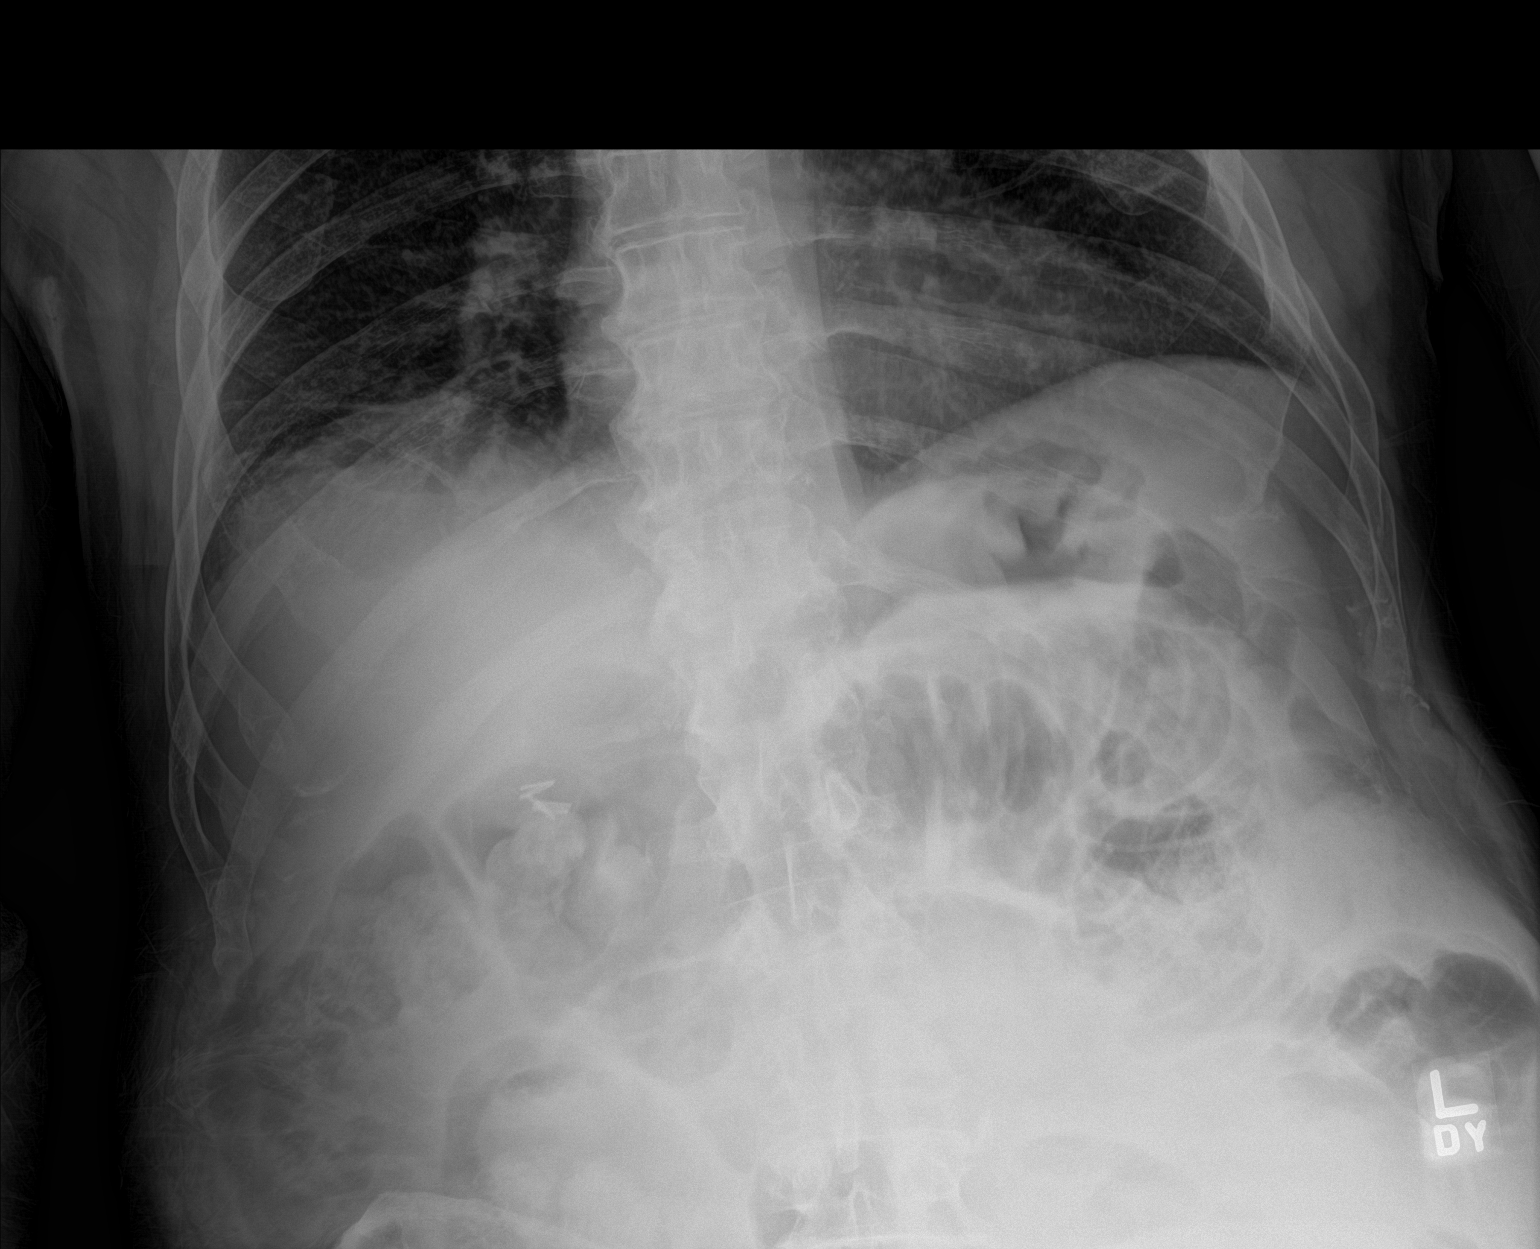

[abdomen supine]
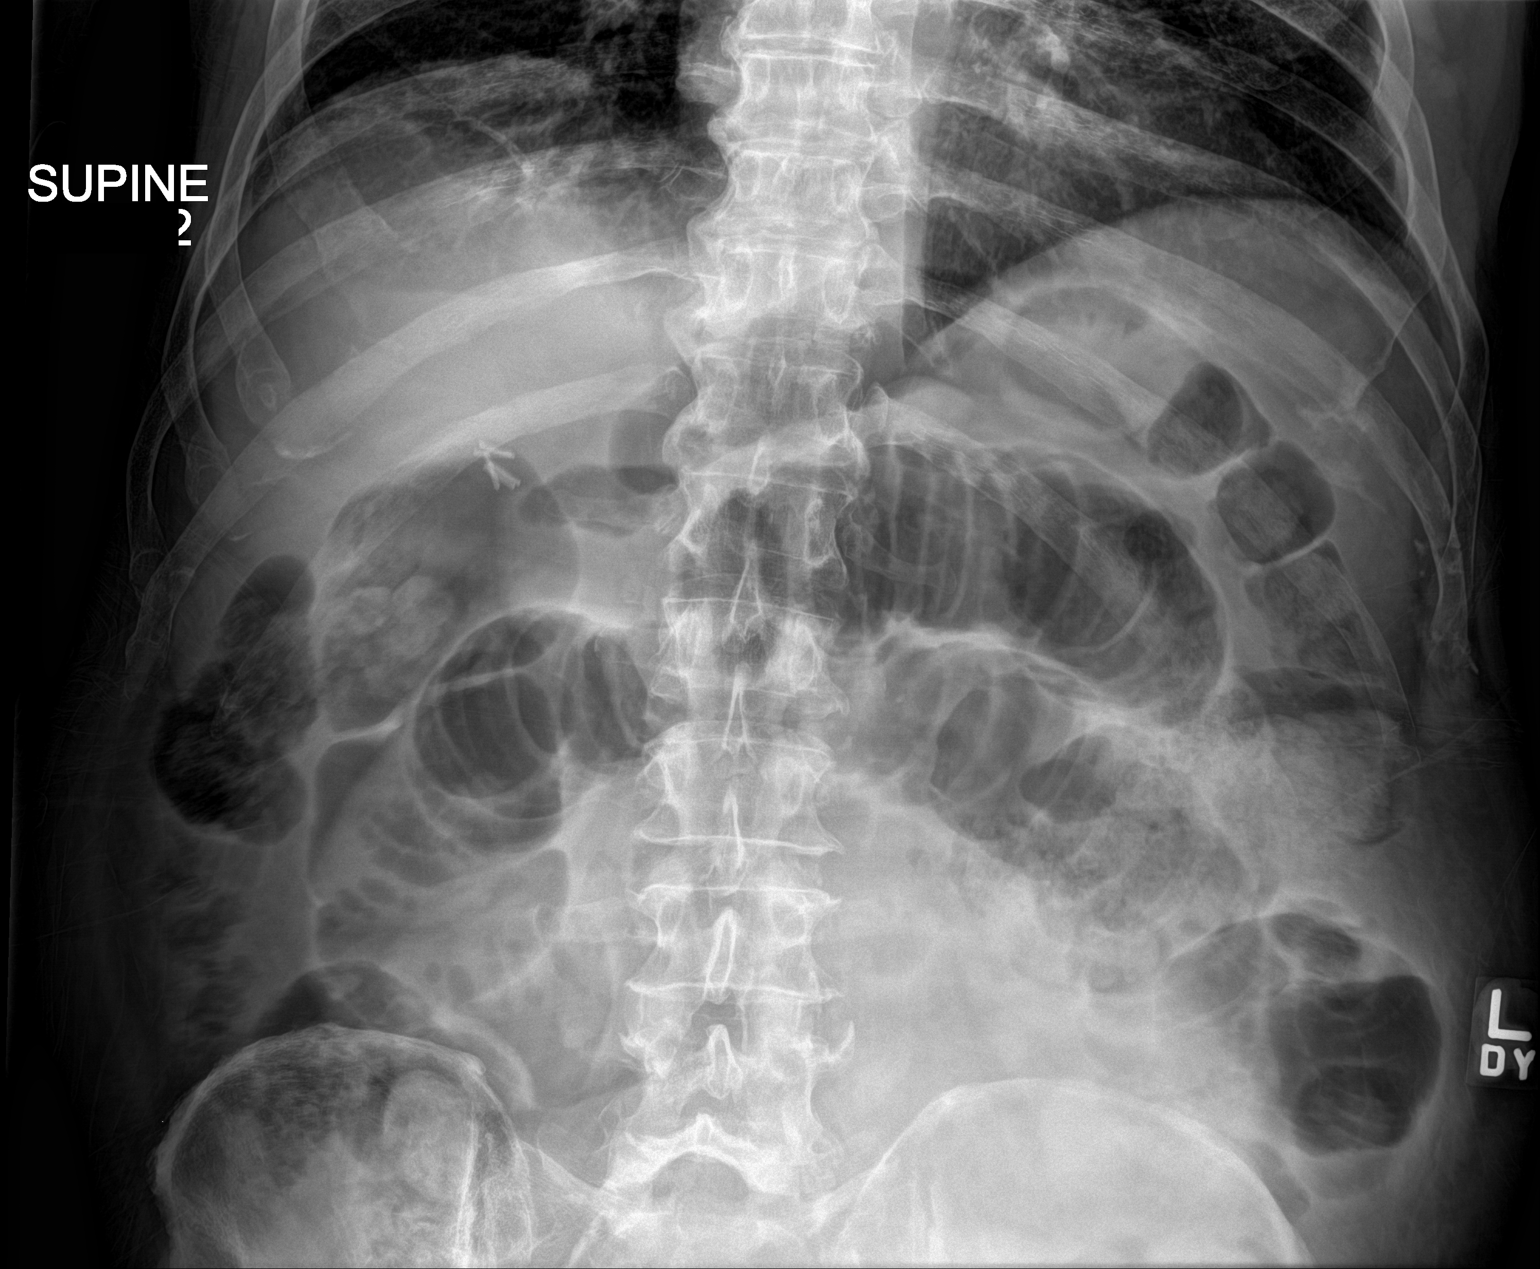

[3 of 3 positions shown; findings below may reference images not displayed]

FINDINGS: Dilated small bowel loops within the abdomen again noted.

Gas and stool in the colon again noted.

Cholecystectomy clips again identified.
IMPRESSION: Little significant change in dilated small bowel loops.

## 2017-07-19 IMAGING — DX DG CHEST 1V PORT
1 series · 1 of 1 positions shown · non-contrast
Comparison: Chest radiograph from 07/14/2014

CLINICAL DATA: Acute onset of cough.  Initial encounter.

EXAM:
PORTABLE CHEST 1 VIEW

[chest ap]
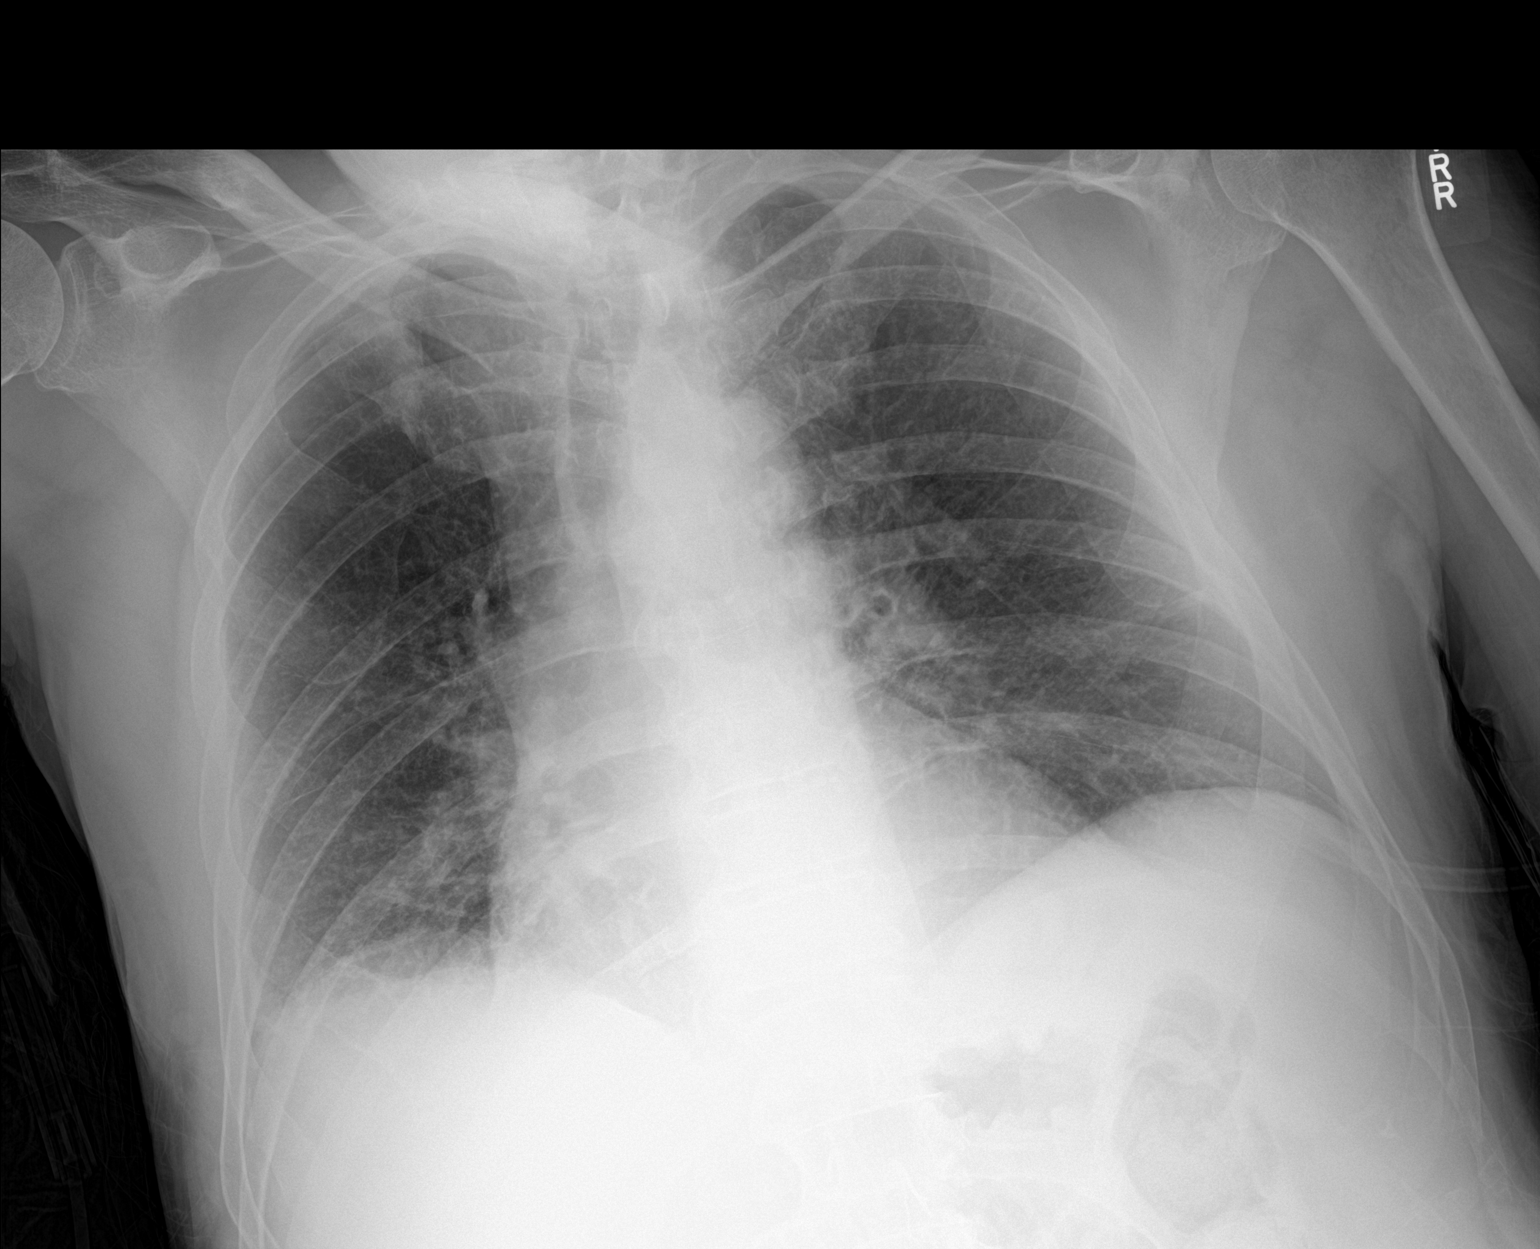

[1 of 1 positions shown; findings below may reference images not displayed]

FINDINGS: The lungs are well-aerated. Peribronchial thickening is noted. Mild
bibasilar opacities may reflect mild interstitial edema or possibly
pneumonia. There is no evidence of pleural effusion or pneumothorax.

The cardiomediastinal silhouette is within normal limits. No acute
osseous abnormalities are seen.
IMPRESSION: Peribronchial thickening noted. Mild bibasilar airspace opacities
may reflect mild interstitial edema or possibly pneumonia.

## 2017-08-14 DIAGNOSIS — H2513 Age-related nuclear cataract, bilateral: Secondary | ICD-10-CM | POA: Diagnosis not present

## 2017-09-01 ENCOUNTER — Ambulatory Visit (INDEPENDENT_AMBULATORY_CARE_PROVIDER_SITE_OTHER): Payer: Medicare PPO | Admitting: Gastroenterology

## 2017-09-01 ENCOUNTER — Encounter: Payer: Self-pay | Admitting: Gastroenterology

## 2017-09-01 VITALS — BP 140/79 | HR 87 | Ht 68.0 in | Wt 174.6 lb

## 2017-09-01 DIAGNOSIS — Z1211 Encounter for screening for malignant neoplasm of colon: Secondary | ICD-10-CM

## 2017-09-01 NOTE — Progress Notes (Signed)
Jonathon Bellows MD, MRCP(U.K) [address]  [place]  [place], [place] [address]  Main: [phone]  Fax: [phone]   Gastroenterology Consultation  Referring Provider:     Glean Hess, MD Primary Care Physician:  Glean Hess, MD Primary Gastroenterologist:  Dr. Jonathon Bellows  Reason for Consultation:     Colon cancer screening         HPI:   Brady Smith is a 72 y.o. y/o male referred for consultation & management  by Dr. [name], Jesse Sans, MD.    He has a history of an incarcerated hernia , bowel ischemia , had surgery in 05/2015 with resection of the necrotic small bowel .   He says that 21 years back he had bladder cancer and says he had surgery and had a kidney bladder constructed from his bowel . Subsequently he has had a lot of surgery since. Explains that he had the incarcerated hernia.  Says he had a colonoscopy many years back and says he had no polyps. No family history of colon cancer.     Past Medical History:  Diagnosis Date  . Anxiety   . Bladder cancer ([place])   . COPD (chronic obstructive pulmonary disease) ([place])   . Depression   . GERD (gastroesophageal reflux disease)   . History of kidney cancer   . Peripheral vascular disease ([place])   . Pneumonia     Past Surgical History:  Procedure Laterality Date  . AMPUTATION Right 07/19/2015   Procedure: AMPUTATION DIGIT ( RIGHT FOOT FIFTH TOE );  Surgeon: Algernon Huxley, MD;  Location: [facility];  Service: Vascular;  Laterality: Right;  . BOWEL RESECTION  05/20/2015   Procedure: SMALL BOWEL RESECTION;  Surgeon: Clayburn Pert, MD;  Location: [facility];  Service: General;;  . CHOLECYSTECTOMY    . COLONOSCOPY  2000  . CYSTECTOMY W/ CONTINENT DIVERSION  1996  . HERNIA REPAIR    . LAPAROTOMY N/A 05/20/2015   Procedure: EXPLORATORY LAPAROTOMY;  Surgeon: Clayburn Pert, MD;  Location: [facility];  Service: General;  Laterality: N/A;  . PERIPHERAL VASCULAR CATHETERIZATION N/A 07/03/2015   Procedure:  Abdominal Aortogram w/Lower Extremity;  Surgeon: Algernon Huxley, MD;  Location: [facility];  Service: Cardiovascular;  Laterality: N/A;  . PERIPHERAL VASCULAR CATHETERIZATION  07/03/2015   Procedure: Lower Extremity Intervention;  Surgeon: Algernon Huxley, MD;  Location: [facility];  Service: Cardiovascular;;  . PROSTATECTOMY  1996  . TONSILLECTOMY      Prior to Admission medications   Medication Sig Start Date End Date Taking? Authorizing Provider  aspirin EC 81 MG tablet Take 81 mg by mouth every other day.    [provider]  ipratropium-albuterol (DUONEB) 0.5-2.5 (3) MG/3ML SOLN Take 3 mLs by nebulization every 6 (six) hours as needed (shortness of breath). 01/24/17   Bettey Costa, MD  mometasone-formoterol (DULERA) 100-5 MCG/ACT AERO Inhale 2 puffs into the lungs 2 (two) times daily. Patient not taking: Reported on 07/14/2017 01/24/17 07/14/17  Bettey Costa, MD  Turmeric 500 MG CAPS Take 1 tablet by mouth daily.    [provider]    Family History  Problem Relation Age of Onset  . Heart disease Mother   . Heart disease Father   . COPD Sister      Social History   Tobacco Use  . Smoking status: Current Every Day Smoker    Packs/day: 1.00    Years: 65.00    Pack years:  65.00    Types: Cigarettes  . Smokeless tobacco: Never Used  . Tobacco comment: reduced # of packs smoked to 1 ppd   Substance Use Topics  . Alcohol use: Yes    Alcohol/week: 1.2 oz    Types: 2 Cans of beer per week  . Drug use: No    Allergies as of 09/01/2017  . (No Known Allergies)    Review of Systems:    All systems reviewed and negative except where noted in HPI.   Physical Exam:  There were no vitals taken for this visit. No LMP for male patient. Psych:  Alert and cooperative. Normal mood and affect. General:   Alert,  Well-developed, well-nourished, pleasant and cooperative in NAD Head:  Normocephalic and atraumatic. Eyes:  Sclera clear, no icterus.   Conjunctiva  pink. Ears:  Normal auditory acuity. Nose:  No deformity, discharge, or lesions. Mouth:  No deformity or lesions,oropharynx pink & moist. Neck:  Supple; no masses or thyromegaly. Lungs:  Respirations even and unlabored.  Clear throughout to auscultation.   No wheezes, crackles, or rhonchi. No acute distress. Heart:  Regular rate and rhythm; no murmurs, clicks, rubs, or gallops. Abdomen:  Normal bowel sounds.  No bruits.  Soft, non-tender and non-distended without masses, hepatosplenomegaly, large vertical midline reducible hernia  No guarding or rebound tenderness.    Neurologic:  Alert and oriented x3;  grossly normal neurologically. Skin:  Intact without significant lesions or rashes. No jaundice. Lymph Nodes:  No significant cervical adenopathy. Psych:  Alert and cooperative. Normal mood and affect.  Imaging Studies: No results found.  Assessment and Plan:   Brady Smith is a 72 y.o. y/o male has been referred for colonoscopy for average risk colon cancer risk . He has had multiple abdominal surgeries, has a large ventral hernia with a long scar, I explained higher risk for complications such as perforation due to possible kinking and tortiousity. He said he would like to think about it , if he decides against a colonoscopy , then can consider a CT colonography or cologuard. Advised him to stop smoking . He wanted to specifically know if he would be at risk for injury from blunt trauma to his abdomen , I explained depending on the force of any impact , any blunt force to his abdomen could potentially be dangerous but I couldn't not quantify the same .   Follow up PRN  Dr Jonathon Bellows MD,MRCP(U.K)

## 2017-09-05 ENCOUNTER — Ambulatory Visit: Payer: Self-pay | Admitting: Internal Medicine

## 2017-12-30 ENCOUNTER — Ambulatory Visit (INDEPENDENT_AMBULATORY_CARE_PROVIDER_SITE_OTHER): Payer: Medicare PPO | Admitting: Internal Medicine

## 2017-12-30 ENCOUNTER — Encounter: Payer: Self-pay | Admitting: Internal Medicine

## 2017-12-30 VITALS — BP 118/80 | HR 74 | Resp 16 | Ht 68.0 in | Wt 176.0 lb

## 2017-12-30 DIAGNOSIS — J449 Chronic obstructive pulmonary disease, unspecified: Secondary | ICD-10-CM | POA: Diagnosis not present

## 2017-12-30 DIAGNOSIS — F17210 Nicotine dependence, cigarettes, uncomplicated: Secondary | ICD-10-CM

## 2017-12-30 MED ORDER — IPRATROPIUM-ALBUTEROL 0.5-2.5 (3) MG/3ML IN SOLN: 3.0000 mL | Freq: Three times a day (TID) | RESPIRATORY_TRACT | 1 refills | Status: DC

## 2017-12-30 NOTE — Progress Notes (Signed)
[facility] Consultation      Assessment and Plan:  Chronic bronchitis vs COPD with dyspnea on exertion.  -Dyspnea on exertion, also atypical dyspnea at rest.  Uncertain etiology, suspect this is secondary to chronic lung disease and smoking.  Some part of it may be that he is on used to the sensation of dyspnea, the patient was ambulated in the office today for 360 feet, he had minimal visible dyspnea but complained of sensation of not being able to get air in. - He was demonstrated inhaler technique today.  However he tells me that he does not have Medicare drug coverage, therefore we will prescribe DuoNeb which should be the least expensive of the inhalers. - I am also going check a pulmonary function test. - Declines flu vaccine. - We will refer to lung cancer screening.  Nicotine Abuse.  -Discussed importance of smoke cessation, spent 4 minutes of discussion.   Date: 12/30/2017  MRN# [id number] Brady Smith 05-21-1945  Referring Physician: self referral.   Brady Smith is a 72 y.o. old male seen in consultation for chief complaint of:    Chief Complaint  Patient presents with  . COPD    Former Dr. Melvyn Novas patient 2013  . Shortness of Breath    with exertion  . Cough    white mucus  . Wheezing  . Chest Pain    HPI:   The patient is a 72 year old male smoker with a history of emphysema.  Patient had a 1 day hospital admission in November 2018 for COPD exacerbation.  He was seen by Dr. Melvyn Novas in 2013 after multiple COPD exacerbations resulting in hospital admissions the prior year. He feels that he has breathing trouble throughout the day where he feels that he can not get in any air. It feels like he is under water and he can not get any air, and then he feels "funny". It comes on at any times, and not exacerbating factors, it can come on when he is resting, and when he is sleeping.  It gets better when he stops and relaxes.  He has a proair inhaler a few  times per day and feels that it helped.  He is currently smoking half a pack of cigarettes per day.  He is not really thinking about smoking cessation seriously.  **Desat test 12/30/17>>Baseline oxygen saturation at rest on RA, sat is 95% and HR is 70. He walked 360 feet, normal, fast gait, conversational. Mild dyspnea but complained of severe dyspnea, though he appears relatively comfortable. Sat is 95% and HR 79.   **CXR 01/23/17>>lungs are unremarkable.  **CBC 06/13/2017>> absolute eosinophil count 100. **Echocardiogram 05/23/2015>> ejection fraction 60%, pulmonary artery systolic pressure 95+.   PMHX:   Past Medical History:  Diagnosis Date  . Anxiety   . Bladder cancer ([place])   . COPD (chronic obstructive pulmonary disease) ([place])   . Depression   . GERD (gastroesophageal reflux disease)   . History of kidney cancer   . Peripheral vascular disease ([place])   . Pneumonia    Surgical Hx:  Past Surgical History:  Procedure Laterality Date  . AMPUTATION Right 07/19/2015   Procedure: AMPUTATION DIGIT ( RIGHT FOOT FIFTH TOE );  Surgeon: Algernon Huxley, MD;  Location: [facility];  Service: Vascular;  Laterality: Right;  . BOWEL RESECTION  05/20/2015   Procedure: SMALL BOWEL RESECTION;  Surgeon: Clayburn Pert, MD;  Location: [facility];  Service: General;;  . CHOLECYSTECTOMY    .  COLONOSCOPY  2000  . CYSTECTOMY W/ CONTINENT DIVERSION  1996  . HERNIA REPAIR    . LAPAROTOMY N/A 05/20/2015   Procedure: EXPLORATORY LAPAROTOMY;  Surgeon: Clayburn Pert, MD;  Location: [facility];  Service: General;  Laterality: N/A;  . PERIPHERAL VASCULAR CATHETERIZATION N/A 07/03/2015   Procedure: Abdominal Aortogram w/Lower Extremity;  Surgeon: Algernon Huxley, MD;  Location: [facility];  Service: Cardiovascular;  Laterality: N/A;  . PERIPHERAL VASCULAR CATHETERIZATION  07/03/2015   Procedure: Lower Extremity Intervention;  Surgeon: Algernon Huxley, MD;  Location: [facility];  Service: Cardiovascular;;  .  PROSTATECTOMY  1996  . TONSILLECTOMY     Family Hx:  Family History  Problem Relation Age of Onset  . Heart disease Mother   . Heart disease Father   . COPD Sister    Social Hx:   Social History   Tobacco Use  . Smoking status: Current Every Day Smoker    Packs/day: 1.00    Years: 65.00    Pack years: 65.00    Types: Cigarettes  . Smokeless tobacco: Never Used  . Tobacco comment: reduced # of packs smoked to 1 ppd   Substance Use Topics  . Alcohol use: Yes    Alcohol/week: 2.0 standard drinks    Types: 2 Cans of beer per week  . Drug use: No   Medication:    Current Outpatient Medications:  .  aspirin EC 81 MG tablet, Take 81 mg by mouth every other day., Disp: , Rfl:  .  ipratropium-albuterol (DUONEB) 0.5-2.5 (3) MG/3ML SOLN, Take 3 mLs by nebulization every 6 (six) hours as needed (shortness of breath)., Disp: 360 mL, Rfl: 0 .  Turmeric 500 MG CAPS, Take 1 tablet by mouth daily., Disp: , Rfl:    Allergies:  Patient has no known allergies.  Review of Systems: Gen:  Denies  fever, sweats, chills HEENT: Denies blurred vision, double vision. bleeds, sore throat Cvc:  No dizziness, chest pain. Resp:   Denies cough or sputum production, shortness of breath Gi: Denies swallowing difficulty, stomach pain. Gu:  Denies bladder incontinence, burning urine Ext:   No Joint pain, stiffness. Skin: No skin rash,  hives  Endoc:  No polyuria, polydipsia. Psych: No depression, insomnia. Other:  All other systems were reviewed with the patient and were negative other that what is mentioned in the HPI.   Physical Examination:   VS: BP 118/80 (BP Location: Left Arm, Cuff Size: Normal)   Pulse 74   Resp 16   Ht 5\' 8"  (1.727 m)   Wt 176 lb (79.8 kg)   SpO2 95%   BMI 26.76 kg/m   General Appearance: No distress  Neuro:without focal findings,  speech normal,  HEENT: PERRLA, EOM intact.   Pulmonary: normal breath sounds, No wheezing.  Decreased air entry  bilaterally. CardiovascularNormal S1,S2.  No m/r/g.   Abdomen: Benign, Soft, non-tender. Renal:  No costovertebral tenderness  GU:  No performed at this time. Endoc: No evident thyromegaly, no signs of acromegaly. Skin:   warm, no rashes, no ecchymosis  Extremities: normal, no cyanosis, clubbing.  Other findings:    LABORATORY PANEL:   CBC No results for input(s): WBC, HGB, HCT, PLT in the last 168 hours. ------------------------------------------------------------------------------------------------------------------  Chemistries  No results for input(s): NA, K, CL, CO2, GLUCOSE, BUN, CREATININE, CALCIUM, MG, AST, ALT, ALKPHOS, BILITOT in the last 168 hours.  Invalid input(s): GFRCGP ------------------------------------------------------------------------------------------------------------------  Cardiac Enzymes No results for input(s): TROPONINI in the last  168 hours. ------------------------------------------------------------  RADIOLOGY:  No results found.     Thank  you for the consultation and for allowing [facility] to assist in the care of your patient. Our recommendations are noted above.  Please contact us if we can be of further service.   Marda Stalker, M.D., F.C.C.P.  Board Certified in Internal Medicine, Pulmonary Medicine, [place], and Sleep Medicine.  [facility] Office Number: [phone]   12/30/2017

## 2017-12-30 NOTE — Patient Instructions (Addendum)
Will check  lung function test.  Will start a nebulizer with duoneb three times daily.  Will refer to lung cancer screening.  --Quitting smoking is the most important thing that you can do for your health.  --Quitting smoking will have greater affect on your health than any medicine that we can give you.

## 2017-12-31 ENCOUNTER — Telehealth: Payer: Self-pay | Admitting: *Deleted

## 2017-12-31 NOTE — Telephone Encounter (Signed)
Called patient about LDCT screening program.  Patient was adamant about NOT having the scan ever.  Stated "I don't want to know bad news, or know what is wrong with me, I just need a doctor to treat my breathing problems."  Attempted to explain the need for scan to view lungs in order to make sure that there was no lung cancer or other issues and that that would give the MD more information in order to offer him the correct treatment.  Patient was aggravated and adamant on the phone that he was not doing the scan.  Encouraged patient to talk with his PCP.

## 2018-01-02 ENCOUNTER — Telehealth: Payer: Self-pay

## 2018-01-02 NOTE — Telephone Encounter (Signed)
I spoke with patient by phone.  We discussed his extensive surgical history, small bowel resection and bladder cancer surgery.  I explained that a screening colonoscopy would be too risky in view of the lack of symptoms.  He expressed understanding of my explanation and will return if he needs anything.

## 2018-01-02 NOTE — Telephone Encounter (Signed)
Patient called very concerned about why he could not get colonoscopy. He said the foreign doctor said "There is no hope for you." He asked if we will review notes and see if there is a misunderstanding. I explained after reviewing the GI consult that because of scar tissue, it would be high risk to go forth but that it is still your decision. He would like PCP to review notes and se what he should do and is there hope for him? I asked hope regarding what and he said his having cancer surgery if he got cancer.

## 2018-01-06 ENCOUNTER — Telehealth: Payer: Self-pay | Admitting: Internal Medicine

## 2018-01-06 NOTE — Telephone Encounter (Signed)
Patient states he has been having issues since appointment  Patient complains of difficulty breathing, pains in chest and rough headaches Patient would like to know if we can put him in the hospital, does not want to go to ER Please call to discuss

## 2018-01-06 NOTE — Telephone Encounter (Signed)
Pt was contacted by [facility] for low dose CT screening. Pt refused.

## 2018-01-06 NOTE — Telephone Encounter (Signed)
Returned call to patient and explained our office/doctors do not do direct admit. He would need to go to ER. Pt did not sound as if he was in distress. Pt was seen on 10/8 and DR ordered PFT and referred to Lung Cancer screening program. Pt figured that if he was admitted, he could have all test done while in hospital. Explained that this is not how it works. Nothing further needed.

## 2018-01-21 ENCOUNTER — Other Ambulatory Visit: Payer: Self-pay | Admitting: Internal Medicine

## 2018-01-21 ENCOUNTER — Encounter: Payer: Self-pay | Admitting: Internal Medicine

## 2018-01-21 ENCOUNTER — Ambulatory Visit (INDEPENDENT_AMBULATORY_CARE_PROVIDER_SITE_OTHER): Payer: Medicare PPO | Admitting: Internal Medicine

## 2018-01-21 VITALS — BP 130/72 | HR 81 | Ht 68.0 in | Wt 174.0 lb

## 2018-01-21 DIAGNOSIS — F17200 Nicotine dependence, unspecified, uncomplicated: Secondary | ICD-10-CM

## 2018-01-21 DIAGNOSIS — I739 Peripheral vascular disease, unspecified: Secondary | ICD-10-CM | POA: Diagnosis not present

## 2018-01-21 DIAGNOSIS — J431 Panlobular emphysema: Secondary | ICD-10-CM

## 2018-01-21 DIAGNOSIS — L299 Pruritus, unspecified: Secondary | ICD-10-CM | POA: Diagnosis not present

## 2018-01-21 MED ORDER — BUPROPION HCL ER (SR) 150 MG PO TB12: 150.0000 mg | ORAL_TABLET | Freq: Two times a day (BID) | ORAL | 0 refills | Status: DC

## 2018-01-21 MED ORDER — CILOSTAZOL 50 MG PO TABS: 50.0000 mg | ORAL_TABLET | Freq: Two times a day (BID) | ORAL | 3 refills | Status: DC

## 2018-01-21 MED ORDER — ALBUTEROL SULFATE HFA 108 (90 BASE) MCG/ACT IN AERS: 2.0000 | INHALATION_SPRAY | Freq: Four times a day (QID) | RESPIRATORY_TRACT | 2 refills | Status: DC | PRN

## 2018-01-21 NOTE — Progress Notes (Signed)
Date:  01/21/2018   Name:  Brady Smith   DOB:  1945-08-09   MRN:  [id number]   Chief Complaint: COPD (States having difficulty breathing constantly. Doing anything. Walking or sitting still. Walking makes it worse.); Leg Pain (Started about a year ago. Bilateral leg pain. Unbearable pains shooting through legs. Intermittent. ); and Pruritis (Itching all over body. Inside ears, face, nose, and arms. All over body. )  Leg Pain   There was no injury mechanism. The pain is present in the right leg and left leg. The quality of the pain is described as cramping. The pain is moderate. The pain has been fluctuating since onset. The symptoms are aggravated by movement. He has tried rest for the symptoms.  COPD - he stays short of breath all the time. He is not using the nebulizer regularly.  He does continue to smoke. He has not been walking like he used to.   Itching - pt reports itching in ears and nose. Several spots on his back itch as well.  Not using anything for sx.   Review of Systems  Constitutional: Positive for fatigue. Negative for chills and fever.  Respiratory: Positive for cough, shortness of breath and wheezing. Negative for chest tightness.   Cardiovascular: Positive for chest pain. Negative for palpitations and leg swelling.  Musculoskeletal: Positive for arthralgias and myalgias.  Neurological: Negative for dizziness and headaches.  Psychiatric/Behavioral: Negative for sleep disturbance.    Patient Active Problem List   Diagnosis Date Noted  . Paroxysmal atrial fibrillation ([place]) 06/13/2017  . Elevated blood sugar 02/03/2017  . Elevated BP without diagnosis of hypertension 02/03/2017  . S/P amputation of lesser toe ([place]) 07/25/2015  . Peripheral vascular disease ([place]) 07/14/2015  . AA (alcohol abuse) 05/23/2015  . Urinary retention 05/18/2015  . Choroidal nevus, right eye 02/08/2015  . Carpal tunnel syndrome 01/13/2015  . H/O gastrointestinal disease 01/13/2015  .  History of primary malignant neoplasm of urinary bladder 01/13/2015  . Compulsive tobacco user syndrome 01/13/2015  . HLD (hyperlipidemia) 01/13/2015  . Neurosis, phobic 01/13/2015  . Chronic obstructive pulmonary disease ([place]) 06/11/2011  . Lung nodule, solitary 06/11/2011    No Known Allergies  Past Surgical History:  Procedure Laterality Date  . AMPUTATION Right 07/19/2015   Procedure: AMPUTATION DIGIT ( RIGHT FOOT FIFTH TOE );  Surgeon: Algernon Huxley, MD;  Location: [facility];  Service: Vascular;  Laterality: Right;  . BOWEL RESECTION  05/20/2015   Procedure: SMALL BOWEL RESECTION;  Surgeon: Clayburn Pert, MD;  Location: [facility];  Service: General;;  . CHOLECYSTECTOMY    . COLONOSCOPY  2000  . CYSTECTOMY W/ CONTINENT DIVERSION  1996  . HERNIA REPAIR    . LAPAROTOMY N/A 05/20/2015   Procedure: EXPLORATORY LAPAROTOMY;  Surgeon: Clayburn Pert, MD;  Location: [facility];  Service: General;  Laterality: N/A;  . PERIPHERAL VASCULAR CATHETERIZATION N/A 07/03/2015   Procedure: Abdominal Aortogram w/Lower Extremity;  Surgeon: Algernon Huxley, MD;  Location: [facility];  Service: Cardiovascular;  Laterality: N/A;  . PERIPHERAL VASCULAR CATHETERIZATION  07/03/2015   Procedure: Lower Extremity Intervention;  Surgeon: Algernon Huxley, MD;  Location: [facility];  Service: Cardiovascular;;  . PROSTATECTOMY  1996  . TONSILLECTOMY      Social History   Tobacco Use  . Smoking status: Current Every Day Smoker    Packs/day: 1.00    Years: 65.00    Pack years: 65.00    Types: Cigarettes  .  Smokeless tobacco: Never Used  . Tobacco comment: reduced # of packs smoked to 1 ppd   Substance Use Topics  . Alcohol use: Yes    Alcohol/week: 2.0 standard drinks    Types: 2 Cans of beer per week  . Drug use: No     Medication list has been reviewed and updated.  Current Meds  Medication Sig  . acetaminophen (TYLENOL) 325 MG tablet Take 650 mg by mouth every 6 (six) hours as needed.  Marland Kitchen  albuterol (PROVENTIL HFA;VENTOLIN HFA) 108 (90 Base) MCG/ACT inhaler Inhale 2 puffs into the lungs every 6 (six) hours as needed for wheezing or shortness of breath.  Marland Kitchen aspirin EC 81 MG tablet Take 81 mg by mouth every other day.  . ipratropium-albuterol (DUONEB) 0.5-2.5 (3) MG/3ML SOLN Take 3 mLs by nebulization 3 (three) times daily.  . Turmeric 500 MG CAPS Take 1 tablet by mouth daily.    PHQ 2/9 Scores 07/10/2017 01/23/2017 01/19/2016 08/11/2015  PHQ - 2 Score 0 0 1 0  PHQ- 9 Score 0 - - -    Physical Exam  Constitutional: He appears well-developed and well-nourished. No distress.  Neck: Normal range of motion.  Cardiovascular: Normal rate, regular rhythm and normal heart sounds.  Pulses:      Dorsalis pedis pulses are 0 on the right side, and 0 on the left side.       Posterior tibial pulses are 0 on the right side, and 0 on the left side.  Skin dusky - no lesions  Pulmonary/Chest: Accessory muscle usage present. He has decreased breath sounds. He has no wheezes.  Musculoskeletal: He exhibits no edema.  Calfs soft, non tender  Lymphadenopathy:    He has no cervical adenopathy.  Skin: Rash noted. Rash is papular (2 lesions on back with excoriation).  Psychiatric: His speech is normal. His mood appears anxious.    BP 130/72 (BP Location: Right Arm, Patient Position: Sitting, Cuff Size: Normal)   Pulse 81   Ht 5\' 8"  (1.727 m)   Wt 174 lb (78.9 kg)   SpO2 95%   BMI 26.46 kg/m   Assessment and Plan: 1. Peripheral vascular disease ([place]) Work on gradual walking program Quit smoking Begin pletal I declined his request for pain medication - cilostazol (PLETAL) 50 MG tablet; Take 1 tablet (50 mg total) by mouth 2 (two) times daily.  Dispense: 60 tablet; Refill: 3  2. Panlobular emphysema ([facility]) Continue nebulizer and MDI - pt does not like Treligy or similar Quit smoking Pt is not interested in further work up of pulmonary nodule  3. Compulsive tobacco user syndrome -  buPROPion (WELLBUTRIN SR) 150 MG 12 hr tablet; Take 1 tablet (150 mg total) by mouth 2 (two) times daily.  Dispense: 60 tablet; Refill: 0  4. Pruritus Benadryl 25 mg as needed Sweet oil in ear canals PRN   Partially dictated using Editor, commissioning. Any errors are unintentional.  Halina Maidens, MD [facility]  01/21/2018

## 2018-01-21 NOTE — Patient Instructions (Signed)
Use sweet oil in ears for itching.  May take Benedryl 25 mg twice a day as needed for itching.

## 2018-02-25 ENCOUNTER — Encounter: Payer: Self-pay | Admitting: Internal Medicine

## 2018-02-25 ENCOUNTER — Ambulatory Visit (INDEPENDENT_AMBULATORY_CARE_PROVIDER_SITE_OTHER): Payer: Medicare PPO | Admitting: Internal Medicine

## 2018-02-25 VITALS — BP 118/82 | HR 91 | Ht 68.0 in | Wt 182.0 lb

## 2018-02-25 DIAGNOSIS — J431 Panlobular emphysema: Secondary | ICD-10-CM

## 2018-02-25 DIAGNOSIS — I739 Peripheral vascular disease, unspecified: Secondary | ICD-10-CM

## 2018-02-25 DIAGNOSIS — Z23 Encounter for immunization: Secondary | ICD-10-CM

## 2018-02-25 DIAGNOSIS — F17200 Nicotine dependence, unspecified, uncomplicated: Secondary | ICD-10-CM | POA: Diagnosis not present

## 2018-02-25 MED ORDER — IPRATROPIUM-ALBUTEROL 0.5-2.5 (3) MG/3ML IN SOLN: 3.0000 mL | Freq: Three times a day (TID) | RESPIRATORY_TRACT | 3 refills | Status: DC

## 2018-02-25 NOTE — Progress Notes (Signed)
Date:  02/25/2018   Name:  Brady Smith   DOB:  1945-08-26   MRN:  [id number]   Chief Complaint: Nicotine Dependence (Patient started taking Bupropion medication a month ago for smoking cessation. Here to follow up today. )  Nicotine Dependence  Presents for follow-up visit. Symptoms are negative for fatigue. His urge triggers include company of smokers. The symptoms have been improving (did not take Wellbutrin). He smokes < 1/2 a pack of cigarettes per day.  He has chosen not to take bupropion.  He is cutting back on his own, now only 6 per day.  PAD -  petal given last visit but he did not start taking it.  He still has leg sx and can not walk more than 1/2 a block. He needs to quit smoking completely.  COPD - continues on duonebs tid.  Ventolin as needed when not at home.  No recent colored sputum, fever, chills, or change in stamina.  Review of Systems  Constitutional: Negative for chills, fatigue and fever.  HENT: Negative for trouble swallowing.   Respiratory: Positive for shortness of breath and wheezing. Negative for cough and chest tightness.   Cardiovascular: Negative for chest pain, palpitations and leg swelling.  Musculoskeletal: Positive for myalgias.  Hematological: Negative for adenopathy.  Psychiatric/Behavioral: Negative for dysphoric mood and sleep disturbance.    Patient Active Problem List   Diagnosis Date Noted  . Paroxysmal atrial fibrillation ([place]) 06/13/2017  . Elevated blood sugar 02/03/2017  . Elevated BP without diagnosis of hypertension 02/03/2017  . S/P amputation of lesser toe ([place]) 07/25/2015  . Peripheral vascular disease ([place]) 07/14/2015  . AA (alcohol abuse) 05/23/2015  . Urinary retention 05/18/2015  . Choroidal nevus, right eye 02/08/2015  . Carpal tunnel syndrome 01/13/2015  . H/O gastrointestinal disease 01/13/2015  . History of primary malignant neoplasm of urinary bladder 01/13/2015  . Compulsive tobacco user syndrome 01/13/2015  .  HLD (hyperlipidemia) 01/13/2015  . Neurosis, phobic 01/13/2015  . Chronic obstructive pulmonary disease ([place]) 06/11/2011  . Lung nodule, solitary 06/11/2011    No Known Allergies  Past Surgical History:  Procedure Laterality Date  . AMPUTATION Right 07/19/2015   Procedure: AMPUTATION DIGIT ( RIGHT FOOT FIFTH TOE );  Surgeon: Algernon Huxley, MD;  Location: [facility];  Service: Vascular;  Laterality: Right;  . BOWEL RESECTION  05/20/2015   Procedure: SMALL BOWEL RESECTION;  Surgeon: Clayburn Pert, MD;  Location: [facility];  Service: General;;  . CHOLECYSTECTOMY    . COLONOSCOPY  2000  . CYSTECTOMY W/ CONTINENT DIVERSION  1996  . HERNIA REPAIR    . LAPAROTOMY N/A 05/20/2015   Procedure: EXPLORATORY LAPAROTOMY;  Surgeon: Clayburn Pert, MD;  Location: [facility];  Service: General;  Laterality: N/A;  . PERIPHERAL VASCULAR CATHETERIZATION N/A 07/03/2015   Procedure: Abdominal Aortogram w/Lower Extremity;  Surgeon: Algernon Huxley, MD;  Location: [facility];  Service: Cardiovascular;  Laterality: N/A;  . PERIPHERAL VASCULAR CATHETERIZATION  07/03/2015   Procedure: Lower Extremity Intervention;  Surgeon: Algernon Huxley, MD;  Location: [facility];  Service: Cardiovascular;;  . PROSTATECTOMY  1996  . TONSILLECTOMY      Social History   Tobacco Use  . Smoking status: Current Every Day Smoker    Packs/day: 0.25    Years: 65.00    Pack years: 16.25    Types: Cigarettes  . Smokeless tobacco: Never Used  . Tobacco comment: reduced # of packs smoked to 5-6 ciggs daily  from 3 pks daily   Substance Use Topics  . Alcohol use: Yes    Alcohol/week: 2.0 standard drinks    Types: 2 Cans of beer per week  . Drug use: No     Medication list has been reviewed and updated.  Current Meds  Medication Sig  . acetaminophen (TYLENOL) 325 MG tablet Take 650 mg by mouth every 6 (six) hours as needed.  Marland Kitchen albuterol (PROVENTIL HFA;VENTOLIN HFA) 108 (90 Base) MCG/ACT inhaler Inhale 2 puffs into the  lungs every 6 (six) hours as needed for wheezing or shortness of breath.  Marland Kitchen aspirin EC 81 MG tablet Take 81 mg by mouth every other day.  . cilostazol (PLETAL) 50 MG tablet Take 1 tablet (50 mg total) by mouth 2 (two) times daily.  Marland Kitchen ipratropium-albuterol (DUONEB) 0.5-2.5 (3) MG/3ML SOLN Take 3 mLs by nebulization every 6 (six) hours as needed (shortness of breath).  Marland Kitchen ipratropium-albuterol (DUONEB) 0.5-2.5 (3) MG/3ML SOLN Take 3 mLs by nebulization 3 (three) times daily.  . Turmeric 500 MG CAPS Take 1 tablet by mouth daily.    PHQ 2/9 Scores 07/10/2017 01/23/2017 01/19/2016 08/11/2015  PHQ - 2 Score 0 0 1 0  PHQ- 9 Score 0 - - -    Physical Exam  Constitutional: He is oriented to person, place, and time. He appears well-developed. No distress.  HENT:  Head: Normocephalic and atraumatic.  Cardiovascular: Normal rate, regular rhythm and normal heart sounds.  Pulses:      Dorsalis pedis pulses are 0 on the right side, and 0 on the left side.       Posterior tibial pulses are 0 on the right side, and 0 on the left side.  Pulmonary/Chest: Effort normal. No respiratory distress.  Abdominal: Soft. Bowel sounds are normal.  Musculoskeletal: Normal range of motion.  Neurological: He is alert and oriented to person, place, and time.  Skin: Skin is warm and dry. No rash noted.  Psychiatric: He has a normal mood and affect. His behavior is normal. Thought content normal.  Nursing note and vitals reviewed.   BP 118/82 (BP Location: Right Arm, Patient Position: Sitting, Cuff Size: Normal)   Pulse 91   Ht 5\' 8"  (1.727 m)   Wt 182 lb (82.6 kg)   SpO2 94%   BMI 27.67 kg/m   Assessment and Plan: 1. Peripheral vascular disease ([place]) Encouraged to begin Pletal Work on smoking cessation  2. Compulsive tobacco user syndrome Continue to cut back without medication at this time  3. Panlobular emphysema ([facility]) - ipratropium-albuterol (DUONEB) 0.5-2.5 (3) MG/3ML SOLN; Take 3 mLs by nebulization 3  (three) times daily.  Dispense: 360 mL; Refill: 3  4. Need for vaccination for pneumococcus - Pneumococcal conjugate vaccine 13-valent IM   Partially dictated using Editor, commissioning. Any errors are unintentional.  Halina Maidens, MD [facility]  02/25/2018

## 2018-02-25 NOTE — Patient Instructions (Signed)
Pneumococcal Conjugate Vaccine (PCV13) What You Need to Know 1. Why get vaccinated? Vaccination can protect both children and adults from pneumococcal disease. Pneumococcal disease is caused by bacteria that can spread from person to person through close contact. It can cause ear infections, and it can also lead to more serious infections of the:  Lungs (pneumonia),  Blood (bacteremia), and  Covering of the brain and spinal cord (meningitis).  Pneumococcal pneumonia is most common among adults. Pneumococcal meningitis can cause deafness and brain damage, and it kills about 1 child in 10 who get it. Anyone can get pneumococcal disease, but children under 2 years of age and adults 65 years and older, people with certain medical conditions, and cigarette smokers are at the highest risk. Before there was a vaccine, the [place] saw:  more than 700 cases of meningitis,  about 13,000 blood infections,  about 5 million ear infections, and  about 200 deaths  in children under 5 each year from pneumococcal disease. Since vaccine became available, severe pneumococcal disease in these children has fallen by 88%. About 18,000 older adults die of pneumococcal disease each year in the [place]. Treatment of pneumococcal infections with penicillin and other drugs is not as effective as it used to be, because some strains of the disease have become resistant to these drugs. This makes prevention of the disease, through vaccination, even more important. 2. PCV13 vaccine Pneumococcal conjugate vaccine (called PCV13) protects against 13 types of pneumococcal bacteria. PCV13 is routinely given to children at 2, 4, 6, and 12-15 months of age. It is also recommended for children and adults 2 to 64 years of age with certain health conditions, and for all adults 65 years of age and older. Your doctor can give you details. 3. Some people should not get this vaccine Anyone who has ever had a  life-threatening allergic reaction to a dose of this vaccine, to an earlier pneumococcal vaccine called PCV7, or to any vaccine containing diphtheria toxoid (for example, DTaP), should not get PCV13. Anyone with a severe allergy to any component of PCV13 should not get the vaccine. Tell your doctor if the person being vaccinated has any severe allergies. If the person scheduled for vaccination is not feeling well, your healthcare provider might decide to reschedule the shot on another day. 4. Risks of a vaccine reaction With any medicine, including vaccines, there is a chance of reactions. These are usually mild and go away on their own, but serious reactions are also possible. Problems reported following PCV13 varied by age and dose in the series. The most common problems reported among children were:  About half became drowsy after the shot, had a temporary loss of appetite, or had redness or tenderness where the shot was given.  About 1 out of 3 had swelling where the shot was given.  About 1 out of 3 had a mild fever, and about 1 in 20 had a fever over 102.2F.  Up to about 8 out of 10 became fussy or irritable.  Adults have reported pain, redness, and swelling where the shot was given; also mild fever, fatigue, headache, chills, or muscle pain. Young children who get PCV13 along with inactivated flu vaccine at the same time may be at increased risk for seizures caused by fever. Ask your doctor for more information. Problems that could happen after any vaccine:  People sometimes faint after a medical procedure, including vaccination. Sitting or lying down for about 15 minutes can help prevent   fainting, and injuries caused by a fall. Tell your doctor if you feel dizzy, or have vision changes or ringing in the ears.  Some older children and adults get severe pain in the shoulder and have difficulty moving the arm where a shot was given. This happens very rarely.  Any medication can cause a  severe allergic reaction. Such reactions from a vaccine are very rare, estimated at about 1 in a million doses, and would happen within a few minutes to a few hours after the vaccination. As with any medicine, there is a very small chance of a vaccine causing a serious injury or death. The safety of vaccines is always being monitored. For more information, visit: www.cdc.gov/vaccinesafety/ 5. What if there is a serious reaction? What should I look for? Look for anything that concerns you, such as signs of a severe allergic reaction, very high fever, or unusual behavior. Signs of a severe allergic reaction can include hives, swelling of the face and throat, difficulty breathing, a fast heartbeat, dizziness, and weakness-usually within a few minutes to a few hours after the vaccination. What should I do?  If you think it is a severe allergic reaction or other emergency that can't wait, call 9-1-1 or get the person to the nearest hospital. Otherwise, call your doctor.  Reactions should be reported to the Vaccine Adverse Event Reporting System (VAERS). Your doctor should file this report, or you can do it yourself through the VAERS web site at www.vaers.hhs.gov, or by calling 1-800-822-7967. ? VAERS does not give medical advice. 6. The National Vaccine Injury Compensation Program The National Vaccine Injury Compensation Program (VICP) is a federal program that was created to compensate people who may have been injured by certain vaccines. Persons who believe they may have been injured by a vaccine can learn about the program and about filing a claim by calling 1-800-338-2382 or visiting the VICP website at www.hrsa.gov/vaccinecompensation. There is a time limit to file a claim for compensation. 7. How can I learn more?  Ask your healthcare provider. He or she can give you the vaccine package insert or suggest other sources of information.  Call your local or state health department.  Contact the  Centers for Disease Control and Prevention (CDC): ? Call 1-800-232-4636 (1-800-CDC-INFO) or ? Visit CDC's website at www.cdc.gov/vaccines Vaccine Information Statement, PCV13 Vaccine (01/27/2014) This information is not intended to replace advice given to you by your health care provider. Make sure you discuss any questions you have with your health care provider. Document Released: 01/06/2006 Document Revised: 11/30/2015 Document Reviewed: 11/30/2015 Elsevier Interactive Patient Education  2017 [employer]  

## 2018-03-19 ENCOUNTER — Ambulatory Visit: Payer: Medicare PPO | Attending: Internal Medicine

## 2018-04-04 ENCOUNTER — Other Ambulatory Visit: Payer: Self-pay | Admitting: Internal Medicine

## 2018-04-24 ENCOUNTER — Ambulatory Visit (INDEPENDENT_AMBULATORY_CARE_PROVIDER_SITE_OTHER): Payer: Medicare Other | Admitting: Internal Medicine

## 2018-04-24 ENCOUNTER — Encounter: Payer: Self-pay | Admitting: Internal Medicine

## 2018-04-24 VITALS — BP 116/82 | HR 93 | Ht 68.0 in | Wt 179.0 lb

## 2018-04-24 DIAGNOSIS — Z89421 Acquired absence of other right toe(s): Secondary | ICD-10-CM | POA: Diagnosis not present

## 2018-04-24 DIAGNOSIS — J431 Panlobular emphysema: Secondary | ICD-10-CM | POA: Diagnosis not present

## 2018-04-24 DIAGNOSIS — R413 Other amnesia: Secondary | ICD-10-CM | POA: Insufficient documentation

## 2018-04-24 DIAGNOSIS — S0001XA Abrasion of scalp, initial encounter: Secondary | ICD-10-CM

## 2018-04-24 DIAGNOSIS — I739 Peripheral vascular disease, unspecified: Secondary | ICD-10-CM | POA: Diagnosis not present

## 2018-04-24 NOTE — Progress Notes (Signed)
Date:  04/24/2018   Name:  Brady Smith   DOB:  1945/06/28   MRN:  [id number]   Chief Complaint: Diplopia (Wants B12- said a "woman on tv said B12 can help with eyesight and sores in his head.")  Rash  This is a chronic problem. The problem has been waxing and waning since onset. The affected locations include the scalp. The rash is characterized by itchiness. Associated symptoms include shortness of breath. Pertinent negatives include no cough, fatigue or fever. Treatments tried: in the past seen by Dermatology -   COPD - he says that the albuterol made his breathing worse when he used the nebulizer.  However he is able to use the MDI and is doing fairly well.  Double vision - chronic, unchanged due to his left eye muscle weakness medially so that his eye deviates laterally.  Phantom pain - foot is healed, but still has occasional pain in the absent toe.  Memory problems - he thinks his memory is slightly worse.    Review of Systems  Constitutional: Negative for chills, fatigue, fever and unexpected weight change.  Eyes: Positive for visual disturbance.  Respiratory: Positive for shortness of breath. Negative for cough, chest tightness and wheezing.   Cardiovascular: Negative for chest pain, palpitations and leg swelling.  Gastrointestinal: Negative for abdominal pain.       Mild heartburn  Skin: Positive for rash.  Neurological: Negative for dizziness, light-headedness and headaches.  Psychiatric/Behavioral: Positive for decreased concentration. Negative for dysphoric mood and sleep disturbance. The patient is not nervous/anxious.     Patient Active Problem List   Diagnosis Date Noted  . Paroxysmal atrial fibrillation ([place]) 06/13/2017  . Elevated blood sugar 02/03/2017  . Elevated BP without diagnosis of hypertension 02/03/2017  . S/P amputation of lesser toe ([place]) 07/25/2015  . Peripheral vascular disease ([place]) 07/14/2015  . AA (alcohol abuse) 05/23/2015  . Urinary  retention 05/18/2015  . Choroidal nevus, right eye 02/08/2015  . Carpal tunnel syndrome 01/13/2015  . H/O gastrointestinal disease 01/13/2015  . History of primary malignant neoplasm of urinary bladder 01/13/2015  . Compulsive tobacco user syndrome 01/13/2015  . HLD (hyperlipidemia) 01/13/2015  . Neurosis, phobic 01/13/2015  . Chronic obstructive pulmonary disease ([place]) 06/11/2011  . Lung nodule, solitary 06/11/2011    No Known Allergies  Past Surgical History:  Procedure Laterality Date  . AMPUTATION Right 07/19/2015   Procedure: AMPUTATION DIGIT ( RIGHT FOOT FIFTH TOE );  Surgeon: Algernon Huxley, MD;  Location: [facility];  Service: Vascular;  Laterality: Right;  . BOWEL RESECTION  05/20/2015   Procedure: SMALL BOWEL RESECTION;  Surgeon: Clayburn Pert, MD;  Location: [facility];  Service: General;;  . CHOLECYSTECTOMY    . COLONOSCOPY  2000  . CYSTECTOMY W/ CONTINENT DIVERSION  1996  . HERNIA REPAIR    . LAPAROTOMY N/A 05/20/2015   Procedure: EXPLORATORY LAPAROTOMY;  Surgeon: Clayburn Pert, MD;  Location: [facility];  Service: General;  Laterality: N/A;  . PERIPHERAL VASCULAR CATHETERIZATION N/A 07/03/2015   Procedure: Abdominal Aortogram w/Lower Extremity;  Surgeon: Algernon Huxley, MD;  Location: [facility];  Service: Cardiovascular;  Laterality: N/A;  . PERIPHERAL VASCULAR CATHETERIZATION  07/03/2015   Procedure: Lower Extremity Intervention;  Surgeon: Algernon Huxley, MD;  Location: [facility];  Service: Cardiovascular;;  . PROSTATECTOMY  1996  . TONSILLECTOMY      Social History   Tobacco Use  . Smoking status: Current Every Day Smoker  Packs/day: 0.25    Years: 65.00    Pack years: 16.25    Types: Cigarettes  . Smokeless tobacco: Never Used  . Tobacco comment: reduced # of packs smoked to 5-6 ciggs daily from 3 pks daily   Substance Use Topics  . Alcohol use: Yes    Alcohol/week: 2.0 standard drinks    Types: 2 Cans of beer per week  . Drug use: No      Medication list has been reviewed and updated.  Current Meds  Medication Sig  . acetaminophen (TYLENOL) 325 MG tablet Take 650 mg by mouth every 6 (six) hours as needed.  Marland Kitchen albuterol (PROVENTIL HFA;VENTOLIN HFA) 108 (90 Base) MCG/ACT inhaler Inhale 2 puffs into the lungs every 6 (six) hours as needed for wheezing or shortness of breath.  Marland Kitchen aspirin EC 81 MG tablet Take 81 mg by mouth every other day.  Marland Kitchen buPROPion (WELLBUTRIN SR) 150 MG 12 hr tablet TAKE 1 TABLET BY MOUTH TWICE DAILY  . cilostazol (PLETAL) 50 MG tablet Take 1 tablet (50 mg total) by mouth 2 (two) times daily.  Marland Kitchen ipratropium-albuterol (DUONEB) 0.5-2.5 (3) MG/3ML SOLN Take 3 mLs by nebulization 3 (three) times daily.  . Turmeric 500 MG CAPS Take 1 tablet by mouth daily.    PHQ 2/9 Scores 04/24/2018 07/10/2017 01/23/2017 01/19/2016  PHQ - 2 Score 0 0 0 1  PHQ- 9 Score - 0 - -   6CIT Screen 04/24/2018 07/10/2017  What Year? 0 points 0 points  What month? 0 points 0 points  What time? 0 points 0 points  Count back from 20 0 points 0 points  Months in reverse 4 points 4 points  Repeat phrase 2 points 2 points  Total Score 6 6     Physical Exam Vitals signs and nursing note reviewed.  Constitutional:      General: He is not in acute distress.    Appearance: He is well-developed.  HENT:     Head: Normocephalic and atraumatic.  Eyes:     General: Lids are normal.     Comments: Left eye deviates laterally; able to move left eye to the mid line and slightly medially  Neck:     Musculoskeletal: Normal range of motion and neck supple.  Cardiovascular:     Rate and Rhythm: Normal rate and regular rhythm.  Pulmonary:     Effort: Pulmonary effort is normal. No respiratory distress.     Breath sounds: Decreased breath sounds present. No wheezing or rhonchi.  Musculoskeletal:     Comments: Right fifth toe surgically absent  Lymphadenopathy:     Cervical: No cervical adenopathy.  Skin:    General: Skin is warm and  dry.     Findings: No rash.     Comments: Several small areas of excoriation on vertex  Neurological:     Mental Status: He is alert and oriented to person, place, and time.  Psychiatric:        Attention and Perception: Attention normal.        Mood and Affect: Mood normal.        Speech: Speech normal.        Behavior: Behavior normal.        Thought Content: Thought content normal.     BP 116/82   Pulse 93   Ht 5\' 8"  (1.727 m)   Wt 179 lb (81.2 kg)   SpO2 96%   BMI 27.22 kg/m   Assessment and Plan: 1. Peripheral  vascular disease ([place]) Stable, continue Pletal - Comprehensive metabolic panel  2. Panlobular emphysema ([place]) Continue MDI since he did not tolerate duonebs - CBC with Differential/Platelet  3. Status post amputation of lesser toe of right foot ([place]) unchanged  4. Memory changes Will check B12 and TSH - B12 - TSH  5. Excoriation of scalp, initial encounter Appears self inflicted - pt is convinced that he needs B12 Consider Dermatology evaluation   Partially dictated using [place]. Any errors are unintentional.  Halina Maidens, MD [facility]  04/24/2018

## 2018-04-25 LAB — CBC WITH DIFFERENTIAL/PLATELET
BASOS: 0 %
Basophils Absolute: 0 10*3/uL (ref 0.0–0.2)
EOS (ABSOLUTE): 0.1 10*3/uL (ref 0.0–0.4)
Eos: 2 %
Hematocrit: 48.4 % (ref 37.5–51.0)
Hemoglobin: 16.5 g/dL (ref 13.0–17.7)
Immature Grans (Abs): 0.1 10*3/uL (ref 0.0–0.1)
Immature Granulocytes: 1 %
LYMPHS ABS: 1.3 10*3/uL (ref 0.7–3.1)
Lymphs: 17 %
MCH: 31.9 pg (ref 26.6–33.0)
MCHC: 34.1 g/dL (ref 31.5–35.7)
MCV: 94 fL (ref 79–97)
MONOS ABS: 0.4 10*3/uL (ref 0.1–0.9)
Monocytes: 6 %
Neutrophils Absolute: 5.6 10*3/uL (ref 1.4–7.0)
Neutrophils: 74 %
Platelets: 195 10*3/uL (ref 150–450)
RBC: 5.17 x10E6/uL (ref 4.14–5.80)
RDW: 13.3 % (ref 11.6–15.4)
WBC: 7.5 10*3/uL (ref 3.4–10.8)

## 2018-04-25 LAB — COMPREHENSIVE METABOLIC PANEL
A/G RATIO: 1.6 (ref 1.2–2.2)
ALK PHOS: 77 IU/L (ref 39–117)
ALT: 16 IU/L (ref 0–44)
AST: 15 IU/L (ref 0–40)
Albumin: 4.4 g/dL (ref 3.7–4.7)
BUN/Creatinine Ratio: 14 (ref 10–24)
BUN: 14 mg/dL (ref 8–27)
Bilirubin Total: 0.4 mg/dL (ref 0.0–1.2)
CO2: 24 mmol/L (ref 20–29)
CREATININE: 0.98 mg/dL (ref 0.76–1.27)
Calcium: 9 mg/dL (ref 8.6–10.2)
Chloride: 102 mmol/L (ref 96–106)
GFR calc Af Amer: 89 mL/min/{1.73_m2} (ref >59)
GFR, EST NON AFRICAN AMERICAN: 77 mL/min/{1.73_m2} (ref >59)
GLOBULIN, TOTAL: 2.7 g/dL (ref 1.5–4.5)
Glucose: 108 mg/dL — ABNORMAL HIGH (ref 65–99)
Potassium: 4.5 mmol/L (ref 3.5–5.2)
Sodium: 145 mmol/L — ABNORMAL HIGH (ref 134–144)
Total Protein: 7.1 g/dL (ref 6.0–8.5)

## 2018-04-25 LAB — TSH: TSH: 1.39 u[IU]/mL (ref 0.450–4.500)

## 2018-04-25 LAB — VITAMIN B12: Vitamin B-12: 147 pg/mL — ABNORMAL LOW (ref 232–1245)

## 2018-04-28 DIAGNOSIS — H2513 Age-related nuclear cataract, bilateral: Secondary | ICD-10-CM | POA: Diagnosis not present

## 2018-04-29 ENCOUNTER — Ambulatory Visit (INDEPENDENT_AMBULATORY_CARE_PROVIDER_SITE_OTHER): Payer: Medicare Other

## 2018-04-29 DIAGNOSIS — E538 Deficiency of other specified B group vitamins: Secondary | ICD-10-CM | POA: Diagnosis not present

## 2018-04-29 MED ORDER — CYANOCOBALAMIN 1000 MCG/ML IJ SOLN
1000.0000 ug | Freq: Once | INTRAMUSCULAR | Status: AC
  Administered 2018-04-29: 1000 ug via INTRAMUSCULAR

## 2018-05-06 ENCOUNTER — Ambulatory Visit (INDEPENDENT_AMBULATORY_CARE_PROVIDER_SITE_OTHER): Payer: Medicare Other

## 2018-05-06 DIAGNOSIS — E538 Deficiency of other specified B group vitamins: Secondary | ICD-10-CM | POA: Diagnosis not present

## 2018-05-06 MED ORDER — CYANOCOBALAMIN 1000 MCG/ML IJ SOLN
1000.0000 ug | Freq: Once | INTRAMUSCULAR | Status: AC
  Administered 2018-05-06: 1000 ug via INTRAMUSCULAR

## 2018-05-06 NOTE — Progress Notes (Signed)
B12 injection administered in the Right deltoid. The pt tolerated w/o an complications. Appt scheduled for the 3rd injection in one week.

## 2018-05-13 ENCOUNTER — Ambulatory Visit (INDEPENDENT_AMBULATORY_CARE_PROVIDER_SITE_OTHER): Payer: Medicare Other

## 2018-05-13 DIAGNOSIS — E538 Deficiency of other specified B group vitamins: Secondary | ICD-10-CM

## 2018-05-13 MED ORDER — CYANOCOBALAMIN 1000 MCG/ML IJ SOLN
1000.0000 ug | Freq: Once | INTRAMUSCULAR | Status: AC
  Administered 2018-05-13: 1000 ug via INTRAMUSCULAR

## 2018-05-13 NOTE — Progress Notes (Signed)
c 

## 2018-05-20 ENCOUNTER — Ambulatory Visit: Payer: Self-pay

## 2018-05-21 ENCOUNTER — Ambulatory Visit (INDEPENDENT_AMBULATORY_CARE_PROVIDER_SITE_OTHER): Payer: Medicare Other | Admitting: Internal Medicine

## 2018-05-21 DIAGNOSIS — E538 Deficiency of other specified B group vitamins: Secondary | ICD-10-CM

## 2018-05-21 MED ORDER — CYANOCOBALAMIN 1000 MCG/ML IJ SOLN
1000.0000 ug | Freq: Once | INTRAMUSCULAR | Status: AC
  Administered 2018-05-21: 1000 ug via INTRAMUSCULAR

## 2018-05-21 NOTE — Progress Notes (Signed)
Patient came in today for his 4th B12 injection. Took pt into Room1. Given in Rt Deltoid, Pt tolerated well. Scheduled for 30 days from today. Pt aware.

## 2018-06-22 ENCOUNTER — Ambulatory Visit: Payer: Self-pay

## 2018-06-22 ENCOUNTER — Other Ambulatory Visit: Payer: Self-pay

## 2018-06-22 ENCOUNTER — Ambulatory Visit (INDEPENDENT_AMBULATORY_CARE_PROVIDER_SITE_OTHER): Payer: Medicare Other

## 2018-06-22 DIAGNOSIS — E538 Deficiency of other specified B group vitamins: Secondary | ICD-10-CM | POA: Diagnosis not present

## 2018-06-22 MED ORDER — CYANOCOBALAMIN 1000 MCG/ML IJ SOLN
1000.0000 ug | Freq: Once | INTRAMUSCULAR | Status: AC
  Administered 2018-06-22: 1000 ug via INTRAMUSCULAR

## 2018-07-15 ENCOUNTER — Ambulatory Visit: Payer: Self-pay

## 2018-07-24 ENCOUNTER — Other Ambulatory Visit: Payer: Self-pay

## 2018-07-24 ENCOUNTER — Ambulatory Visit (INDEPENDENT_AMBULATORY_CARE_PROVIDER_SITE_OTHER): Payer: Medicare Other

## 2018-07-24 DIAGNOSIS — E538 Deficiency of other specified B group vitamins: Secondary | ICD-10-CM | POA: Diagnosis not present

## 2018-07-24 MED ORDER — CYANOCOBALAMIN 1000 MCG/ML IJ SOLN
1000.0000 ug | Freq: Once | INTRAMUSCULAR | Status: AC
  Administered 2018-07-24: 1000 ug via INTRAMUSCULAR

## 2018-08-19 ENCOUNTER — Encounter: Payer: Self-pay | Admitting: Internal Medicine

## 2018-08-25 ENCOUNTER — Other Ambulatory Visit: Payer: Self-pay

## 2018-08-25 ENCOUNTER — Ambulatory Visit (INDEPENDENT_AMBULATORY_CARE_PROVIDER_SITE_OTHER): Payer: Medicare Other

## 2018-08-25 DIAGNOSIS — E538 Deficiency of other specified B group vitamins: Secondary | ICD-10-CM | POA: Diagnosis not present

## 2018-08-25 MED ORDER — CYANOCOBALAMIN 1000 MCG/ML IJ SOLN
1000.0000 ug | Freq: Once | INTRAMUSCULAR | Status: AC
  Administered 2018-08-25: 1000 ug via INTRAMUSCULAR

## 2018-09-10 DIAGNOSIS — H2513 Age-related nuclear cataract, bilateral: Secondary | ICD-10-CM | POA: Diagnosis not present

## 2018-09-28 DIAGNOSIS — H2512 Age-related nuclear cataract, left eye: Secondary | ICD-10-CM | POA: Diagnosis not present

## 2018-09-28 DIAGNOSIS — I509 Heart failure, unspecified: Secondary | ICD-10-CM | POA: Diagnosis not present

## 2018-09-29 ENCOUNTER — Other Ambulatory Visit: Payer: Self-pay

## 2018-09-29 ENCOUNTER — Ambulatory Visit (INDEPENDENT_AMBULATORY_CARE_PROVIDER_SITE_OTHER): Payer: Medicare Other

## 2018-09-29 DIAGNOSIS — E538 Deficiency of other specified B group vitamins: Secondary | ICD-10-CM | POA: Diagnosis not present

## 2018-09-29 MED ORDER — CYANOCOBALAMIN 1000 MCG/ML IJ SOLN
1000.0000 ug | Freq: Once | INTRAMUSCULAR | Status: AC
  Administered 2018-09-29: 1000 ug via INTRAMUSCULAR

## 2018-09-29 NOTE — Discharge Instructions (Signed)

## 2018-10-01 ENCOUNTER — Other Ambulatory Visit: Payer: Self-pay

## 2018-10-01 ENCOUNTER — Encounter: Payer: Self-pay | Admitting: *Deleted

## 2018-10-02 ENCOUNTER — Other Ambulatory Visit
Admission: RE | Admit: 2018-10-02 | Discharge: 2018-10-02 | Disposition: A | Discharge status: Home / Self Care | Payer: Medicare Other | Source: Ambulatory Visit | Attending: Ophthalmology | Admitting: Ophthalmology

## 2018-10-04 NOTE — Anesthesia Preprocedure Evaluation (Addendum)
Anesthesia Evaluation  Patient identified by MRN, date of birth, ID band Patient awake    Reviewed: Allergy & Precautions, NPO status , Patient's Chart, lab work & pertinent test results  History of Anesthesia Complications Negative for: history of anesthetic complications  Airway Mallampati: III   Neck ROM: Full    Dental   Missing all but 6 teeth, none of which are loose per pt:   Pulmonary COPD, Current Smoker (1 ppd),    Pulmonary exam normal breath sounds clear to auscultation       Cardiovascular + Peripheral Vascular Disease  Normal cardiovascular exam Rhythm:Regular Rate:Normal     Neuro/Psych PSYCHIATRIC DISORDERS Anxiety Depression negative neurological ROS     GI/Hepatic GERD  ,  Endo/Other  negative endocrine ROS  Renal/GU negative Renal ROS     Musculoskeletal   Abdominal   Peds  Hematology Bladder CA   Anesthesia Other Findings   Reproductive/Obstetrics                            Anesthesia Physical Anesthesia Plan  ASA: III  Anesthesia Plan: MAC   Post-op Pain Management:    Induction: Intravenous  PONV Risk Score and Plan: 0 and TIVA and Midazolam  Airway Management Planned: Natural Airway  Additional Equipment:   Intra-op Plan:   Post-operative Plan:   Informed Consent: I have reviewed the patients History and Physical, chart, labs and discussed the procedure including the risks, benefits and alternatives for the proposed anesthesia with the patient or authorized representative who has indicated his/her understanding and acceptance.       Plan Discussed with: CRNA  Anesthesia Plan Comments:        Anesthesia Quick Evaluation

## 2018-10-06 ENCOUNTER — Encounter
Admission: RE | Disposition: A | Discharge status: Home / Self Care | Payer: Self-pay | Source: Home / Self Care | Attending: Ophthalmology

## 2018-10-06 ENCOUNTER — Ambulatory Visit
Admission: RE | Admit: 2018-10-06 | Discharge: 2018-10-06 | Disposition: A | Discharge status: Home / Self Care | Payer: Medicare Other | Attending: Ophthalmology | Admitting: Ophthalmology

## 2018-10-06 ENCOUNTER — Ambulatory Visit: Payer: Medicare Other

## 2018-10-06 ENCOUNTER — Other Ambulatory Visit
Admission: RE | Admit: 2018-10-06 | Discharge: 2018-10-06 | Disposition: A | Discharge status: Home / Self Care | Payer: Medicare Other | Source: Ambulatory Visit | Attending: Ophthalmology | Admitting: Ophthalmology

## 2018-10-06 ENCOUNTER — Ambulatory Visit: Payer: Medicare Other | Admitting: Anesthesiology

## 2018-10-06 ENCOUNTER — Other Ambulatory Visit: Payer: Self-pay

## 2018-10-06 DIAGNOSIS — Z8551 Personal history of malignant neoplasm of bladder: Secondary | ICD-10-CM | POA: Insufficient documentation

## 2018-10-06 DIAGNOSIS — Z7982 Long term (current) use of aspirin: Secondary | ICD-10-CM | POA: Diagnosis not present

## 2018-10-06 DIAGNOSIS — J439 Emphysema, unspecified: Secondary | ICD-10-CM | POA: Insufficient documentation

## 2018-10-06 DIAGNOSIS — H25812 Combined forms of age-related cataract, left eye: Secondary | ICD-10-CM | POA: Diagnosis not present

## 2018-10-06 DIAGNOSIS — F17210 Nicotine dependence, cigarettes, uncomplicated: Secondary | ICD-10-CM | POA: Insufficient documentation

## 2018-10-06 DIAGNOSIS — Z85528 Personal history of other malignant neoplasm of kidney: Secondary | ICD-10-CM | POA: Insufficient documentation

## 2018-10-06 DIAGNOSIS — H9190 Unspecified hearing loss, unspecified ear: Secondary | ICD-10-CM | POA: Insufficient documentation

## 2018-10-06 DIAGNOSIS — I739 Peripheral vascular disease, unspecified: Secondary | ICD-10-CM | POA: Insufficient documentation

## 2018-10-06 DIAGNOSIS — I509 Heart failure, unspecified: Secondary | ICD-10-CM | POA: Insufficient documentation

## 2018-10-06 DIAGNOSIS — H2512 Age-related nuclear cataract, left eye: Secondary | ICD-10-CM | POA: Insufficient documentation

## 2018-10-06 DIAGNOSIS — Z79899 Other long term (current) drug therapy: Secondary | ICD-10-CM | POA: Insufficient documentation

## 2018-10-06 DIAGNOSIS — Z1159 Encounter for screening for other viral diseases: Secondary | ICD-10-CM | POA: Insufficient documentation

## 2018-10-06 HISTORY — PX: CATARACT EXTRACTION W/PHACO: SHX586

## 2018-10-06 LAB — SARS CORONAVIRUS 2 BY RT PCR (HOSPITAL ORDER, PERFORMED IN ~~LOC~~ HOSPITAL LAB): SARS Coronavirus 2: NEGATIVE

## 2018-10-06 SURGERY — PHACOEMULSIFICATION, CATARACT, WITH IOL INSERTION
Anesthesia: Monitor Anesthesia Care | Site: Eye | Laterality: Left

## 2018-10-06 MED ORDER — FENTANYL CITRATE (PF) 100 MCG/2ML IJ SOLN
INTRAMUSCULAR | Status: DC | PRN
  Administered 2018-10-06 (×2): 50 ug via INTRAVENOUS

## 2018-10-06 MED ORDER — LIDOCAINE HCL (PF) 2 % IJ SOLN
INTRAOCULAR | Status: DC | PRN
  Administered 2018-10-06: 1 mL

## 2018-10-06 MED ORDER — ONDANSETRON HCL 4 MG/2ML IJ SOLN: 4.0000 mg | Freq: Once | INTRAMUSCULAR | Status: DC | PRN

## 2018-10-06 MED ORDER — ACETAMINOPHEN 160 MG/5ML PO SOLN: 325.0000 mg | ORAL | Status: DC | PRN

## 2018-10-06 MED ORDER — BRIMONIDINE TARTRATE-TIMOLOL 0.2-0.5 % OP SOLN
OPHTHALMIC | Status: DC | PRN
  Administered 2018-10-06: 1 [drp] via OPHTHALMIC

## 2018-10-06 MED ORDER — TETRACAINE HCL 0.5 % OP SOLN
1.0000 [drp] | OPHTHALMIC | Status: DC | PRN
  Administered 2018-10-06 (×3): 1 [drp] via OPHTHALMIC

## 2018-10-06 MED ORDER — ACETAMINOPHEN 325 MG PO TABS: 650.0000 mg | ORAL_TABLET | Freq: Once | ORAL | Status: DC | PRN

## 2018-10-06 MED ORDER — MIDAZOLAM HCL 2 MG/2ML IJ SOLN
INTRAMUSCULAR | Status: DC | PRN
  Administered 2018-10-06: 1.5 mg via INTRAVENOUS

## 2018-10-06 MED ORDER — MOXIFLOXACIN HCL 0.5 % OP SOLN
OPHTHALMIC | Status: DC | PRN
  Administered 2018-10-06: 0.2 mL via OPHTHALMIC

## 2018-10-06 MED ORDER — NA CHONDROIT SULF-NA HYALURON 40-17 MG/ML IO SOLN
INTRAOCULAR | Status: DC | PRN
  Administered 2018-10-06: 1 mL via INTRAOCULAR

## 2018-10-06 MED ORDER — EPINEPHRINE PF 1 MG/ML IJ SOLN
INTRAOCULAR | Status: DC | PRN
  Administered 2018-10-06: 80 mL via OPHTHALMIC

## 2018-10-06 MED ORDER — ARMC OPHTHALMIC DILATING DROPS
1.0000 "application " | OPHTHALMIC | Status: DC | PRN
  Administered 2018-10-06 (×3): 1 via OPHTHALMIC

## 2018-10-06 SURGICAL SUPPLY — 19 items
CANNULA ANT/CHMB 27G (MISCELLANEOUS) ×1 IMPLANT
CANNULA ANT/CHMB 27GA (MISCELLANEOUS) ×2 IMPLANT
GLOVE SURG LX 8.0 MICRO (GLOVE) ×1
GLOVE SURG LX STRL 8.0 MICRO (GLOVE) ×1 IMPLANT
GLOVE SURG TRIUMPH 8.0 PF LTX (GLOVE) ×2 IMPLANT
GOWN STRL REUS W/ TWL LRG LVL3 (GOWN DISPOSABLE) ×2 IMPLANT
GOWN STRL REUS W/TWL LRG LVL3 (GOWN DISPOSABLE) ×2
LENS IOL TECNIS ITEC 21.5 (Intraocular Lens) ×1 IMPLANT
MARKER SKIN DUAL TIP RULER LAB (MISCELLANEOUS) ×2 IMPLANT
NDL FILTER BLUNT 18X1 1/2 (NEEDLE) ×1 IMPLANT
NDL RETROBULBAR .5 NSTRL (NEEDLE) ×2 IMPLANT
NEEDLE FILTER BLUNT 18X 1/2SAF (NEEDLE) ×1
NEEDLE FILTER BLUNT 18X1 1/2 (NEEDLE) ×1 IMPLANT
PACK EYE AFTER SURG (MISCELLANEOUS) ×2 IMPLANT
PACK OPTHALMIC (MISCELLANEOUS) ×2 IMPLANT
PACK PORFILIO (MISCELLANEOUS) ×2 IMPLANT
SYR 3ML LL SCALE MARK (SYRINGE) ×2 IMPLANT
SYR TB 1ML LUER SLIP (SYRINGE) ×2 IMPLANT
WIPE NON LINTING 3.25X3.25 (MISCELLANEOUS) ×2 IMPLANT

## 2018-10-06 NOTE — H&P (Signed)
All labs reviewed. Abnormal studies sent to patients PCP when indicated.  Previous H&P reviewed, patient examined, there are NO CHANGES.  Brady Husain Porfilio7/14/20201:12 PM

## 2018-10-06 NOTE — Anesthesia Postprocedure Evaluation (Signed)
Anesthesia Post Note  Patient: Brady Smith  Procedure(s) Performed: CATARACT EXTRACTION PHACO AND INTRAOCULAR LENS PLACEMENT (IOC)  LEFT (Left Eye)  Patient location during evaluation: PACU Anesthesia Type: MAC Level of consciousness: awake and alert, oriented and patient cooperative Pain management: pain level controlled Vital Signs Assessment: post-procedure vital signs reviewed and stable Respiratory status: spontaneous breathing, nonlabored ventilation and respiratory function stable Cardiovascular status: blood pressure returned to baseline and stable Postop Assessment: adequate PO intake Anesthetic complications: no    Darrin Nipper

## 2018-10-06 NOTE — Anesthesia Procedure Notes (Signed)
Procedure Name: MAC Performed by: Cailynn Bodnar, CRNA Pre-anesthesia Checklist: Patient identified, Emergency Drugs available, Suction available, Timeout performed and Patient being monitored Patient Re-evaluated:Patient Re-evaluated prior to induction Oxygen Delivery Method: Nasal cannula Placement Confirmation: positive ETCO2       

## 2018-10-06 NOTE — Op Note (Signed)
PREOPERATIVE DIAGNOSIS:  Nuclear sclerotic cataract of the left eye.   POSTOPERATIVE DIAGNOSIS:  Nuclear sclerotic cataract of the left eye.   OPERATIVE PROCEDURE: Procedure(s): CATARACT EXTRACTION PHACO AND INTRAOCULAR LENS PLACEMENT (IOC)  LEFT   SURGEON:  Birder Robson, MD.   ANESTHESIA:  Anesthesiologist: Darrin Nipper, MD CRNA: Mayme Genta, CRNA  1.      Managed anesthesia care. 2.     0.70ml of Shugarcaine was instilled following the paracentesis   COMPLICATIONS:  None.   TECHNIQUE:   Stop and chop   DESCRIPTION OF PROCEDURE:  The patient was examined and consented in the preoperative holding area where the aforementioned topical anesthesia was applied to the left eye and then brought back to the Operating Room where the left eye was prepped and draped in the usual sterile ophthalmic fashion and a lid speculum was placed. A paracentesis was created with the side port blade and the anterior chamber was filled with viscoelastic. A near clear corneal incision was performed with the steel keratome. A continuous curvilinear capsulorrhexis was performed with a cystotome followed by the capsulorrhexis forceps. Hydrodissection and hydrodelineation were carried out with BSS on a blunt cannula. The lens was removed in a stop and chop  technique and the remaining cortical material was removed with the irrigation-aspiration handpiece. The capsular bag was inflated with viscoelastic and the Technis [id number] lens was placed in the capsular bag without complication. The remaining viscoelastic was removed from the eye with the irrigation-aspiration handpiece. The wounds were hydrated. The anterior chamber was flushed with BSS and the eye was inflated to physiologic pressure. 0.43ml Vigamox was placed in the anterior chamber. The wounds were found to be water tight. The eye was dressed withCombigan. The patient was given protective glasses to wear throughout the day and a shield with which to sleep  tonight. The patient was also given drops with which to begin a drop regimen today and will follow-up with me in one day. Implant Name Type Inv. Item Serial No. Manufacturer Lot No. LRB No. Used Action  LENS IOL DIOP 21.5 - [id number] Intraocular Lens LENS IOL DIOP 21.5 [id number] AMO  Left 1 Implanted    Procedure(s) with comments: CATARACT EXTRACTION PHACO AND INTRAOCULAR LENS PLACEMENT (IOC)  LEFT (Left) - DAY OF TEST. LIVES IN BOARDING HOUSE  Electronically signed: Birder Robson 10/06/2018 1:44 PM

## 2018-10-06 NOTE — Transfer of Care (Signed)
Immediate Anesthesia Transfer of Care Note  Patient: Brady Smith  Procedure(s) Performed: CATARACT EXTRACTION PHACO AND INTRAOCULAR LENS PLACEMENT (IOC)  LEFT (Left Eye)  Patient Location: PACU  Anesthesia Type: MAC  Level of Consciousness: awake, alert  and patient cooperative  Airway and Oxygen Therapy: Patient Spontanous Breathing and Patient connected to supplemental oxygen  Post-op Assessment: Post-op Vital signs reviewed, Patient's Cardiovascular Status Stable, Respiratory Function Stable, Patent Airway and No signs of Nausea or vomiting  Post-op Vital Signs: Reviewed and stable  Complications: No apparent anesthesia complications

## 2018-10-07 ENCOUNTER — Encounter: Payer: Self-pay | Admitting: Ophthalmology

## 2018-10-07 ENCOUNTER — Ambulatory Visit: Payer: Self-pay

## 2018-10-16 ENCOUNTER — Ambulatory Visit: Payer: Medicare Other | Admitting: Internal Medicine

## 2018-10-21 ENCOUNTER — Encounter: Payer: Self-pay | Admitting: *Deleted

## 2018-10-21 ENCOUNTER — Other Ambulatory Visit: Payer: Self-pay

## 2018-10-21 NOTE — Anesthesia Preprocedure Evaluation (Addendum)
Anesthesia Evaluation  Patient identified by MRN, date of birth, ID band Patient awake    Reviewed: Allergy & Precautions, NPO status , Patient's Chart, lab work & pertinent test results  History of Anesthesia Complications Negative for: history of anesthetic complications  Airway Mallampati: III   Neck ROM: Full    Dental   Missing all but 6 teeth, none of which are loose per pt:   Pulmonary COPD, Current Smoker,    Pulmonary exam normal breath sounds clear to auscultation       Cardiovascular + Peripheral Vascular Disease  Normal cardiovascular exam Rhythm:Regular Rate:Normal     Neuro/Psych PSYCHIATRIC DISORDERS Anxiety Depression negative neurological ROS     GI/Hepatic GERD  ,  Endo/Other  negative endocrine ROS  Renal/GU negative Renal ROS     Musculoskeletal   Abdominal   Peds  Hematology Bladder CA   Anesthesia Other Findings   Reproductive/Obstetrics                             Anesthesia Physical  Anesthesia Plan  ASA: III  Anesthesia Plan: MAC   Post-op Pain Management:    Induction: Intravenous  PONV Risk Score and Plan: 0 and TIVA and Midazolam  Airway Management Planned: Natural Airway  Additional Equipment:   Intra-op Plan:   Post-operative Plan:   Informed Consent: I have reviewed the patients History and Physical, chart, labs and discussed the procedure including the risks, benefits and alternatives for the proposed anesthesia with the patient or authorized representative who has indicated his/her understanding and acceptance.       Plan Discussed with: CRNA  Anesthesia Plan Comments:        Anesthesia Quick Evaluation

## 2018-10-22 DIAGNOSIS — H2511 Age-related nuclear cataract, right eye: Secondary | ICD-10-CM | POA: Diagnosis not present

## 2018-10-22 NOTE — Discharge Instructions (Signed)

## 2018-10-23 ENCOUNTER — Other Ambulatory Visit: Payer: Medicare Other | Attending: Ophthalmology

## 2018-10-27 ENCOUNTER — Ambulatory Visit
Admission: RE | Admit: 2018-10-27 | Discharge: 2018-10-27 | Disposition: A | Discharge status: Home / Self Care | Payer: Medicare Other | Attending: Ophthalmology | Admitting: Ophthalmology

## 2018-10-27 ENCOUNTER — Other Ambulatory Visit: Payer: Self-pay

## 2018-10-27 ENCOUNTER — Ambulatory Visit: Payer: Medicare Other | Admitting: Anesthesiology

## 2018-10-27 ENCOUNTER — Other Ambulatory Visit
Admission: RE | Admit: 2018-10-27 | Discharge: 2018-10-27 | Disposition: A | Discharge status: Home / Self Care | Payer: Medicare Other | Source: Ambulatory Visit | Attending: Ophthalmology | Admitting: Ophthalmology

## 2018-10-27 ENCOUNTER — Encounter
Admission: RE | Disposition: A | Discharge status: Home / Self Care | Payer: Self-pay | Source: Home / Self Care | Attending: Ophthalmology

## 2018-10-27 DIAGNOSIS — Z8551 Personal history of malignant neoplasm of bladder: Secondary | ICD-10-CM | POA: Insufficient documentation

## 2018-10-27 DIAGNOSIS — I739 Peripheral vascular disease, unspecified: Secondary | ICD-10-CM | POA: Insufficient documentation

## 2018-10-27 DIAGNOSIS — Z20828 Contact with and (suspected) exposure to other viral communicable diseases: Secondary | ICD-10-CM | POA: Insufficient documentation

## 2018-10-27 DIAGNOSIS — I509 Heart failure, unspecified: Secondary | ICD-10-CM | POA: Insufficient documentation

## 2018-10-27 DIAGNOSIS — Z7982 Long term (current) use of aspirin: Secondary | ICD-10-CM | POA: Insufficient documentation

## 2018-10-27 DIAGNOSIS — J439 Emphysema, unspecified: Secondary | ICD-10-CM | POA: Insufficient documentation

## 2018-10-27 DIAGNOSIS — H25811 Combined forms of age-related cataract, right eye: Secondary | ICD-10-CM | POA: Diagnosis not present

## 2018-10-27 DIAGNOSIS — H2511 Age-related nuclear cataract, right eye: Secondary | ICD-10-CM | POA: Insufficient documentation

## 2018-10-27 DIAGNOSIS — Z01812 Encounter for preprocedural laboratory examination: Secondary | ICD-10-CM | POA: Insufficient documentation

## 2018-10-27 DIAGNOSIS — F17200 Nicotine dependence, unspecified, uncomplicated: Secondary | ICD-10-CM | POA: Insufficient documentation

## 2018-10-27 DIAGNOSIS — Z79899 Other long term (current) drug therapy: Secondary | ICD-10-CM | POA: Insufficient documentation

## 2018-10-27 HISTORY — PX: CATARACT EXTRACTION W/PHACO: SHX586

## 2018-10-27 LAB — SARS CORONAVIRUS 2 BY RT PCR (HOSPITAL ORDER, PERFORMED IN ~~LOC~~ HOSPITAL LAB): SARS Coronavirus 2: NEGATIVE

## 2018-10-27 SURGERY — PHACOEMULSIFICATION, CATARACT, WITH IOL INSERTION
Anesthesia: Monitor Anesthesia Care | Site: Eye | Laterality: Right

## 2018-10-27 MED ORDER — NA CHONDROIT SULF-NA HYALURON 40-17 MG/ML IO SOLN
INTRAOCULAR | Status: DC | PRN
  Administered 2018-10-27: 1 mL via INTRAOCULAR

## 2018-10-27 MED ORDER — ACETAMINOPHEN 160 MG/5ML PO SOLN: 325.0000 mg | ORAL | Status: DC | PRN

## 2018-10-27 MED ORDER — BRIMONIDINE TARTRATE-TIMOLOL 0.2-0.5 % OP SOLN
OPHTHALMIC | Status: DC | PRN
  Administered 2018-10-27: 1 [drp] via OPHTHALMIC

## 2018-10-27 MED ORDER — FENTANYL CITRATE (PF) 100 MCG/2ML IJ SOLN
INTRAMUSCULAR | Status: DC | PRN
  Administered 2018-10-27 (×2): 50 ug via INTRAVENOUS

## 2018-10-27 MED ORDER — EPINEPHRINE PF 1 MG/ML IJ SOLN
INTRAOCULAR | Status: DC | PRN
  Administered 2018-10-27: 63 mL via OPHTHALMIC

## 2018-10-27 MED ORDER — ACETAMINOPHEN 325 MG PO TABS: 650.0000 mg | ORAL_TABLET | Freq: Once | ORAL | Status: DC | PRN

## 2018-10-27 MED ORDER — ARMC OPHTHALMIC DILATING DROPS
1.0000 "application " | OPHTHALMIC | Status: DC | PRN
  Administered 2018-10-27 (×3): 1 via OPHTHALMIC

## 2018-10-27 MED ORDER — TETRACAINE HCL 0.5 % OP SOLN
1.0000 [drp] | OPHTHALMIC | Status: DC | PRN
  Administered 2018-10-27 (×3): 1 [drp] via OPHTHALMIC

## 2018-10-27 MED ORDER — HYDRALAZINE HCL 20 MG/ML IJ SOLN: 10.0000 mg | Freq: Once | INTRAMUSCULAR | Status: DC

## 2018-10-27 MED ORDER — LIDOCAINE HCL (PF) 2 % IJ SOLN
INTRAOCULAR | Status: DC | PRN
  Administered 2018-10-27: 1 mL

## 2018-10-27 MED ORDER — MOXIFLOXACIN HCL 0.5 % OP SOLN
OPHTHALMIC | Status: DC | PRN
  Administered 2018-10-27: 0.2 mL via OPHTHALMIC

## 2018-10-27 MED ORDER — ONDANSETRON HCL 4 MG/2ML IJ SOLN: 4.0000 mg | Freq: Once | INTRAMUSCULAR | Status: DC | PRN

## 2018-10-27 SURGICAL SUPPLY — 19 items
CANNULA ANT/CHMB 27G (MISCELLANEOUS) ×1 IMPLANT
CANNULA ANT/CHMB 27GA (MISCELLANEOUS) ×2 IMPLANT
GLOVE SURG LX 8.0 MICRO (GLOVE) ×1
GLOVE SURG LX STRL 8.0 MICRO (GLOVE) ×1 IMPLANT
GLOVE SURG TRIUMPH 8.0 PF LTX (GLOVE) ×2 IMPLANT
GOWN STRL REUS W/ TWL LRG LVL3 (GOWN DISPOSABLE) ×2 IMPLANT
GOWN STRL REUS W/TWL LRG LVL3 (GOWN DISPOSABLE) ×2
LENS IOL TECNIS ITEC 19.5 (Intraocular Lens) ×1 IMPLANT
MARKER SKIN DUAL TIP RULER LAB (MISCELLANEOUS) ×2 IMPLANT
NDL FILTER BLUNT 18X1 1/2 (NEEDLE) ×1 IMPLANT
NDL RETROBULBAR .5 NSTRL (NEEDLE) ×2 IMPLANT
NEEDLE FILTER BLUNT 18X 1/2SAF (NEEDLE) ×1
NEEDLE FILTER BLUNT 18X1 1/2 (NEEDLE) ×1 IMPLANT
PACK EYE AFTER SURG (MISCELLANEOUS) ×2 IMPLANT
PACK OPTHALMIC (MISCELLANEOUS) ×2 IMPLANT
PACK PORFILIO (MISCELLANEOUS) ×2 IMPLANT
SYR 3ML LL SCALE MARK (SYRINGE) ×2 IMPLANT
SYR TB 1ML LUER SLIP (SYRINGE) ×2 IMPLANT
WIPE NON LINTING 3.25X3.25 (MISCELLANEOUS) ×2 IMPLANT

## 2018-10-27 NOTE — Transfer of Care (Signed)
Immediate Anesthesia Transfer of Care Note  Patient: Brady Smith  Procedure(s) Performed: CATARACT EXTRACTION PHACO AND INTRAOCULAR LENS PLACEMENT (IOC)  RIGHT (Right Eye)  Patient Location: PACU  Anesthesia Type: MAC  Level of Consciousness: awake, alert  and patient cooperative  Airway and Oxygen Therapy: Patient Spontanous Breathing and Patient connected to supplemental oxygen  Post-op Assessment: Post-op Vital signs reviewed, Patient's Cardiovascular Status Stable, Respiratory Function Stable, Patent Airway and No signs of Nausea or vomiting  Post-op Vital Signs: Reviewed and stable  Complications: No apparent anesthesia complications

## 2018-10-27 NOTE — Op Note (Signed)
PREOPERATIVE DIAGNOSIS:  Nuclear sclerotic cataract of the right eye.   POSTOPERATIVE DIAGNOSIS:  H25.11  CATARACT   OPERATIVE PROCEDURE: Procedure(s): CATARACT EXTRACTION PHACO AND INTRAOCULAR LENS PLACEMENT (IOC)  RIGHT   SURGEON:  Birder Robson, MD.   ANESTHESIA:  Anesthesiologist: Darrin Nipper, MD CRNA: Cameron Ali, CRNA  1.      Managed anesthesia care. 2.      0.84ml of Shugarcaine was instilled in the eye following the paracentesis.   COMPLICATIONS:  None.   TECHNIQUE:   Stop and chop   DESCRIPTION OF PROCEDURE:  The patient was examined and consented in the preoperative holding area where the aforementioned topical anesthesia was applied to the right eye and then brought back to the Operating Room where the right eye was prepped and draped in the usual sterile ophthalmic fashion and a lid speculum was placed. A paracentesis was created with the side port blade and the anterior chamber was filled with viscoelastic. A near clear corneal incision was performed with the steel keratome. A continuous curvilinear capsulorrhexis was performed with a cystotome followed by the capsulorrhexis forceps. Hydrodissection and hydrodelineation were carried out with BSS on a blunt cannula. The lens was removed in a stop and chop  technique and the remaining cortical material was removed with the irrigation-aspiration handpiece. The capsular bag was inflated with viscoelastic and the Technis [id number]  lens was placed in the capsular bag without complication. The remaining viscoelastic was removed from the eye with the irrigation-aspiration handpiece. The wounds were hydrated. The anterior chamber was flushed with BSS and the eye was inflated to physiologic pressure. 0.24ml of Vigamox was placed in the anterior chamber. The wounds were found to be water tight. The eye was dressed with Combigan. The patient was given protective glasses to wear throughout the day and a shield with which to sleep tonight.  The patient was also given drops with which to begin a drop regimen today and will follow-up with me in one day. Implant Name Type Inv. Item Serial No. Manufacturer Lot No. LRB No. Used Action  LENS IOL DIOP 19.5 - [id number] Intraocular Lens LENS IOL DIOP 19.5 [id number]  Right 1 Implanted   Procedure(s) with comments: CATARACT EXTRACTION PHACO AND INTRAOCULAR LENS PLACEMENT (IOC)  RIGHT (Right) - PT HAS TO HAVE COVID TEST MORNING OF LEAVE AT LAST PATIENT  Electronically signed: Birder Robson 10/27/2018 11:18 AM

## 2018-10-27 NOTE — Anesthesia Postprocedure Evaluation (Signed)
Anesthesia Post Note  Patient: Brady Smith  Procedure(s) Performed: CATARACT EXTRACTION PHACO AND INTRAOCULAR LENS PLACEMENT (IOC)  RIGHT (Right Eye)  Patient location during evaluation: PACU Anesthesia Type: MAC Level of consciousness: awake and alert, oriented and patient cooperative Pain management: pain level controlled Vital Signs Assessment: post-procedure vital signs reviewed and stable Respiratory status: spontaneous breathing, nonlabored ventilation and respiratory function stable Cardiovascular status: blood pressure returned to baseline and stable Postop Assessment: adequate PO intake Anesthetic complications: no    Darrin Nipper

## 2018-10-27 NOTE — Anesthesia Procedure Notes (Signed)
Procedure Name: MAC Date/Time: 10/27/2018 11:05 AM Performed by: Cameron Ali, CRNA Pre-anesthesia Checklist: Patient identified, Emergency Drugs available, Suction available, Timeout performed and Patient being monitored Patient Re-evaluated:Patient Re-evaluated prior to induction Oxygen Delivery Method: Nasal cannula Placement Confirmation: positive ETCO2

## 2018-10-27 NOTE — H&P (Signed)
All labs reviewed. Abnormal studies sent to patients PCP when indicated.  Previous H&P reviewed, patient examined, there are NO CHANGES.  Brady Smith Porfilio8/4/202010:49 AM

## 2018-10-28 ENCOUNTER — Encounter: Payer: Self-pay | Admitting: Ophthalmology

## 2018-11-02 ENCOUNTER — Ambulatory Visit (INDEPENDENT_AMBULATORY_CARE_PROVIDER_SITE_OTHER): Payer: Medicare Other

## 2018-11-02 ENCOUNTER — Other Ambulatory Visit: Payer: Self-pay

## 2018-11-02 DIAGNOSIS — E538 Deficiency of other specified B group vitamins: Secondary | ICD-10-CM

## 2018-11-02 MED ORDER — CYANOCOBALAMIN 1000 MCG/ML IJ SOLN
1000.0000 ug | Freq: Once | INTRAMUSCULAR | Status: AC
  Administered 2018-11-02: 1000 ug via INTRAMUSCULAR

## 2018-11-11 ENCOUNTER — Ambulatory Visit (INDEPENDENT_AMBULATORY_CARE_PROVIDER_SITE_OTHER): Payer: Medicare Other | Admitting: Internal Medicine

## 2018-11-11 ENCOUNTER — Encounter: Payer: Self-pay | Admitting: Internal Medicine

## 2018-11-11 ENCOUNTER — Other Ambulatory Visit: Payer: Self-pay

## 2018-11-11 VITALS — BP 118/70 | HR 90 | Ht 66.5 in | Wt 169.0 lb

## 2018-11-11 DIAGNOSIS — I493 Ventricular premature depolarization: Secondary | ICD-10-CM

## 2018-11-11 DIAGNOSIS — R030 Elevated blood-pressure reading, without diagnosis of hypertension: Secondary | ICD-10-CM

## 2018-11-11 NOTE — Progress Notes (Signed)
Date:  11/11/2018   Name:  Brady Smith   DOB:  Dec 08, 1945   MRN:  [id number]   Chief Complaint: PVCs and Hypertension pt went to the EMS station 12 days ago because he wanted his BP checked.  He felt that his balance was not as good since cataract surgery three days earlier.  EKG showed frequent PVCs and they recommended ER.  Pt did not go to ED.  He began taking otc potassium pills.  Today he feels fine but still staggering at times.   Hypertension This is a chronic problem. The problem has been waxing and waning since onset. Associated symptoms include shortness of breath. Pertinent negatives include no anxiety, chest pain, headaches, palpitations or peripheral edema. There are no associated agents to hypertension. Past treatments include nothing.  Palpitations  Chronicity: hx of paroxysmal afib; PVCs seen on EKG at EMS station. The current episode started 1 to 4 weeks ago. Episode frequency: patient does not notice symptoms. Associated symptoms include shortness of breath. Pertinent negatives include no chest pain, dizziness, nausea, vomiting or weakness (and no falls).    Review of Systems  Constitutional: Negative for chills and fatigue.  Respiratory: Positive for shortness of breath and wheezing. Negative for choking and chest tightness.   Cardiovascular: Negative for chest pain, palpitations and leg swelling.  Gastrointestinal: Negative for abdominal pain, diarrhea, nausea and vomiting.  Neurological: Positive for light-headedness. Negative for dizziness, weakness (and no falls) and headaches.    Patient Active Problem List   Diagnosis Date Noted  . Memory changes 04/24/2018  . Excoriation of scalp 04/24/2018  . Paroxysmal atrial fibrillation ([place]) 06/13/2017  . Elevated blood sugar 02/03/2017  . Elevated BP without diagnosis of hypertension 02/03/2017  . S/P amputation of lesser toe ([place]) 07/25/2015  . Peripheral vascular disease ([place]) 07/14/2015  . AA (alcohol abuse)  05/23/2015  . Urinary retention 05/18/2015  . Choroidal nevus, right eye 02/08/2015  . Carpal tunnel syndrome 01/13/2015  . H/O gastrointestinal disease 01/13/2015  . History of primary malignant neoplasm of urinary bladder 01/13/2015  . Compulsive tobacco user syndrome 01/13/2015  . HLD (hyperlipidemia) 01/13/2015  . Neurosis, phobic 01/13/2015  . Chronic obstructive pulmonary disease ([place]) 06/11/2011  . Lung nodule, solitary 06/11/2011    Allergies  Allergen Reactions  . Influenza Vaccines     High blood pressure- had to be admitted  . Lipitor [Atorvastatin Calcium]     Muscle pain    Past Surgical History:  Procedure Laterality Date  . AMPUTATION Right 07/19/2015   Procedure: AMPUTATION DIGIT ( RIGHT FOOT FIFTH TOE );  Surgeon: Algernon Huxley, MD;  Location: [facility];  Service: Vascular;  Laterality: Right;  . BOWEL RESECTION  05/20/2015   Procedure: SMALL BOWEL RESECTION;  Surgeon: Clayburn Pert, MD;  Location: [facility];  Service: General;;  . CATARACT EXTRACTION W/PHACO Left 10/06/2018   Procedure: CATARACT EXTRACTION PHACO AND INTRAOCULAR LENS PLACEMENT ([place])  LEFT;  Surgeon: Birder Robson, MD;  Location: [place];  Service: Ophthalmology;  Laterality: Left;  DAY OF TEST. LIVES IN BOARDING HOUSE  . CATARACT EXTRACTION W/PHACO Right 10/27/2018   Procedure: CATARACT EXTRACTION PHACO AND INTRAOCULAR LENS PLACEMENT ([place])  RIGHT;  Surgeon: Birder Robson, MD;  Location: [place];  Service: Ophthalmology;  Laterality: Right;  PT HAS TO HAVE COVID TEST MORNING OF LEAVE AT LAST PATIENT  . CHOLECYSTECTOMY    . COLONOSCOPY  2000  . CYSTECTOMY W/ CONTINENT DIVERSION  1996  . HERNIA  REPAIR    . LAPAROTOMY N/A 05/20/2015   Procedure: EXPLORATORY LAPAROTOMY;  Surgeon: Clayburn Pert, MD;  Location: [facility];  Service: General;  Laterality: N/A;  . PERIPHERAL VASCULAR CATHETERIZATION N/A 07/03/2015   Procedure: Abdominal Aortogram w/Lower Extremity;  Surgeon: Algernon Huxley, MD;  Location: [place] [facility];  Service: Cardiovascular;  Laterality: N/A;  . PERIPHERAL VASCULAR CATHETERIZATION  07/03/2015   Procedure: Lower Extremity Intervention;  Surgeon: Algernon Huxley, MD;  Location: [place] [facility];  Service: Cardiovascular;;  . PROSTATECTOMY  1996  . TONSILLECTOMY      Social History   Tobacco Use  . Smoking status: Current Every Day Smoker    Packs/day: 0.25    Years: 65.00    Pack years: 16.25    Types: Cigarettes  . Smokeless tobacco: Never Used  . Tobacco comment: reduced # of packs smoked to 5-6 ciggs daily from 3 pks daily   Substance Use Topics  . Alcohol use: Yes    Alcohol/week: 12.0 standard drinks    Types: 12 Cans of beer per week  . Drug use: No     Medication list has been reviewed and updated.  Current Meds  Medication Sig  . acetaminophen (TYLENOL) 325 MG tablet Take 650 mg by mouth every 6 (six) hours as needed.  Marland Kitchen albuterol (PROVENTIL HFA;VENTOLIN HFA) 108 (90 Base) MCG/ACT inhaler Inhale 2 puffs into the lungs every 6 (six) hours as needed for wheezing or shortness of breath.  Marland Kitchen aspirin EC 81 MG tablet Take 81 mg by mouth every other day.  Marland Kitchen buPROPion (WELLBUTRIN SR) 150 MG 12 hr tablet TAKE 1 TABLET BY MOUTH TWICE DAILY  . cilostazol (PLETAL) 50 MG tablet Take 1 tablet (50 mg total) by mouth 2 (two) times daily.  . Cyanocobalamin (VITAMIN B-12 IJ) Inject as directed once a week.  Marland Kitchen ipratropium-albuterol (DUONEB) 0.5-2.5 (3) MG/3ML SOLN Take 3 mLs by nebulization 3 (three) times daily.  . Potassium 99 MG TABS Take by mouth.  . Turmeric 500 MG CAPS Take 1 tablet by mouth daily.    PHQ 2/9 Scores 11/11/2018 04/24/2018 07/10/2017 01/23/2017  PHQ - 2 Score 0 0 0 0  PHQ- 9 Score - - 0 -    BP Readings from Last 3 Encounters:  11/11/18 118/70  10/27/18 (!) 151/88  10/06/18 130/88    Physical Exam Neck:     Musculoskeletal: Normal range of motion.  Cardiovascular:     Rate and Rhythm: Normal rate and regular  rhythm.  No extrasystoles are present.    Pulses:          Radial pulses are 1+ on the right side and 2+ on the left side.     Heart sounds: Heart sounds are distant. No murmur.  Pulmonary:     Effort: Pulmonary effort is normal. Prolonged expiration present.     Breath sounds: Decreased air movement present. Decreased breath sounds and wheezing present. No rhonchi.  Musculoskeletal:     Right lower leg: No edema.     Left lower leg: No edema.  Lymphadenopathy:     Cervical: No cervical adenopathy.  Neurological:     Mental Status: He is alert.  Psychiatric:        Attention and Perception: Attention normal.        Mood and Affect: Mood normal.     Wt Readings from Last 3 Encounters:  11/11/18 169 lb (76.7 kg)  10/27/18 172 lb (78 kg)  10/06/18  168 lb (76.2 kg)    BP 118/70   Pulse 90   Ht 5' 6.5" (1.689 m)   Wt 169 lb (76.7 kg)   SpO2 95%   BMI 26.87 kg/m   Assessment and Plan: 1. Asymptomatic PVCs Previously seen on EKG - at that time had some staggering.  He report doing better since his eye has healed No PVCs on exam today - no indication to repeat EKG Will check thyroid and chemistries Pt will follow up if needed - TSH - Basic metabolic panel  2. Elevated BP without diagnosis of hypertension BP is controlled today He will continue to increase water and limit sodium   Partially dictated using Editor, commissioning. Any errors are unintentional.  Halina Maidens, MD [facility]  11/11/2018

## 2018-11-12 LAB — BASIC METABOLIC PANEL
BUN/Creatinine Ratio: 11 (ref 10–24)
BUN: 13 mg/dL (ref 8–27)
CO2: 21 mmol/L (ref 20–29)
Calcium: 9.7 mg/dL (ref 8.6–10.2)
Chloride: 106 mmol/L (ref 96–106)
Creatinine, Ser: 1.16 mg/dL (ref 0.76–1.27)
GFR calc Af Amer: 72 mL/min/{1.73_m2} (ref >59)
GFR calc non Af Amer: 62 mL/min/{1.73_m2} (ref >59)
Glucose: 83 mg/dL (ref 65–99)
Potassium: 4.9 mmol/L (ref 3.5–5.2)
Sodium: 141 mmol/L (ref 134–144)

## 2018-11-12 LAB — TSH: TSH: 1.53 u[IU]/mL (ref 0.450–4.500)

## 2018-11-26 ENCOUNTER — Ambulatory Visit: Payer: Medicare Other | Admitting: Family Medicine

## 2018-12-21 DIAGNOSIS — L219 Seborrheic dermatitis, unspecified: Secondary | ICD-10-CM | POA: Diagnosis not present

## 2018-12-21 DIAGNOSIS — L853 Xerosis cutis: Secondary | ICD-10-CM | POA: Diagnosis not present

## 2018-12-21 DIAGNOSIS — L299 Pruritus, unspecified: Secondary | ICD-10-CM | POA: Diagnosis not present

## 2019-01-06 ENCOUNTER — Telehealth: Payer: Self-pay | Admitting: Internal Medicine

## 2019-01-06 ENCOUNTER — Other Ambulatory Visit: Payer: Self-pay

## 2019-01-06 ENCOUNTER — Ambulatory Visit (INDEPENDENT_AMBULATORY_CARE_PROVIDER_SITE_OTHER): Payer: Medicare Other

## 2019-01-06 DIAGNOSIS — E538 Deficiency of other specified B group vitamins: Secondary | ICD-10-CM | POA: Diagnosis not present

## 2019-01-06 MED ORDER — CYANOCOBALAMIN 1000 MCG/ML IJ SOLN
1000.0000 ug | Freq: Once | INTRAMUSCULAR | Status: AC
  Administered 2019-01-06: 1000 ug via INTRAMUSCULAR

## 2019-01-06 NOTE — Telephone Encounter (Signed)
°  Called to schedule Medicare Annual Wellness Visit with Nurse Health Advisor, [place]. If patient returns call, please schedule AWV with NHA  Questions regarding scheduling, please call  [phone] or Skype > [email or web address]   [place]  ??[email or web address]   ??[id number]   1Skype

## 2019-02-09 ENCOUNTER — Ambulatory Visit (INDEPENDENT_AMBULATORY_CARE_PROVIDER_SITE_OTHER): Payer: Medicare Other

## 2019-02-09 ENCOUNTER — Other Ambulatory Visit: Payer: Self-pay

## 2019-02-09 DIAGNOSIS — E538 Deficiency of other specified B group vitamins: Secondary | ICD-10-CM

## 2019-02-09 MED ORDER — CYANOCOBALAMIN 1000 MCG/ML IJ SOLN
1000.0000 ug | Freq: Once | INTRAMUSCULAR | Status: AC
  Administered 2019-02-09: 1000 ug via INTRAMUSCULAR

## 2019-02-10 ENCOUNTER — Telehealth: Payer: Self-pay

## 2019-02-10 NOTE — Telephone Encounter (Signed)
Received a FAX from [place] stating patient is requesting a oxygen machine to be sent to his home.   Spoke with Dr. [name] and call the patient. His oxygen has always ranged between 95% to 96% SpO2 in our clinic. Explained to him that we cannot sign off for him to get a oxygen machine because of this.   He stated " I don't understand that because I have trouble breathing and I almost died the other day because I couldn't breathe." Explained I'm sorry to hear this but if he wants something like this, he will need to get a pulmonologist to sign off on this. He said " I don't understand but okay" and then abruptly hung up.  Benedict Needy, CMA

## 2019-03-12 ENCOUNTER — Ambulatory Visit: Payer: Medicare Other

## 2019-03-27 ENCOUNTER — Other Ambulatory Visit: Payer: Self-pay | Admitting: Internal Medicine

## 2019-04-07 ENCOUNTER — Ambulatory Visit (INDEPENDENT_AMBULATORY_CARE_PROVIDER_SITE_OTHER): Payer: Medicare Other | Admitting: Internal Medicine

## 2019-04-07 ENCOUNTER — Other Ambulatory Visit: Payer: Self-pay

## 2019-04-07 ENCOUNTER — Encounter: Payer: Self-pay | Admitting: Internal Medicine

## 2019-04-07 VITALS — BP 148/70 | HR 83 | Ht 66.5 in | Wt 174.0 lb

## 2019-04-07 DIAGNOSIS — E538 Deficiency of other specified B group vitamins: Secondary | ICD-10-CM | POA: Diagnosis not present

## 2019-04-07 DIAGNOSIS — J431 Panlobular emphysema: Secondary | ICD-10-CM | POA: Diagnosis not present

## 2019-04-07 DIAGNOSIS — I10 Essential (primary) hypertension: Secondary | ICD-10-CM

## 2019-04-07 DIAGNOSIS — Z89421 Acquired absence of other right toe(s): Secondary | ICD-10-CM | POA: Diagnosis not present

## 2019-04-07 MED ORDER — LISINOPRIL 10 MG PO TABS: 10.0000 mg | ORAL_TABLET | Freq: Every day | ORAL | 1 refills | Status: DC

## 2019-04-07 MED ORDER — CYANOCOBALAMIN 1000 MCG/ML IJ SOLN
1000.0000 ug | Freq: Once | INTRAMUSCULAR | Status: AC
  Administered 2019-04-07: 1000 ug via INTRAMUSCULAR

## 2019-04-07 NOTE — Progress Notes (Signed)
Date:  04/07/2019   Name:  Brady Smith   DOB:  16-Apr-1945   MRN:  [id number]   Chief Complaint: Hypertension  Hypertension This is a new problem. Episode onset: noted by [facility] care visiting nurse. Condition status: he has has BP reading variable over the years. Associated symptoms include shortness of breath (from COPD). Pertinent negatives include no chest pain, headaches, palpitations or peripheral edema. There are no associated agents to hypertension. Past treatments include nothing (has been given amlodipine and cardiazem in the past for afib but has never taken it regularly).   COPD - he still has chronic shortness of breath.  Still smoking but only using albuterol MDI.  He was seen in the past by Pulmonary but apparently not compliant with recommendations.  Cost has also been prohibitive.  Lab Results  Component Value Date   CREATININE 1.16 11/11/2018   BUN 13 11/11/2018   NA 141 11/11/2018   K 4.9 11/11/2018   CL 106 11/11/2018   CO2 21 11/11/2018   Lab Results  Component Value Date   TRIG 81 05/23/2015   Lab Results  Component Value Date   TSH 1.530 11/11/2018   Lab Results  Component Value Date   HGBA1C 5.3 06/13/2017     Review of Systems  Constitutional: Negative for chills, fatigue, fever and unexpected weight change.  HENT: Negative for trouble swallowing.   Respiratory: Positive for shortness of breath (from COPD) and wheezing.   Cardiovascular: Negative for chest pain and palpitations.  Gastrointestinal: Positive for abdominal distention.  Neurological: Negative for dizziness, weakness, light-headedness and headaches.    Patient Active Problem List   Diagnosis Date Noted  . Memory changes 04/24/2018  . Excoriation of scalp 04/24/2018  . Paroxysmal atrial fibrillation ([place]) 06/13/2017  . Elevated blood sugar 02/03/2017  . Elevated BP without diagnosis of hypertension 02/03/2017  . S/P amputation of lesser toe ([place]) 07/25/2015  .  Peripheral vascular disease ([place]) 07/14/2015  . AA (alcohol abuse) 05/23/2015  . Urinary retention 05/18/2015  . Choroidal nevus, right eye 02/08/2015  . Carpal tunnel syndrome 01/13/2015  . H/O gastrointestinal disease 01/13/2015  . History of primary malignant neoplasm of urinary bladder 01/13/2015  . Compulsive tobacco user syndrome 01/13/2015  . HLD (hyperlipidemia) 01/13/2015  . Neurosis, phobic 01/13/2015  . Chronic obstructive pulmonary disease ([place]) 06/11/2011  . Lung nodule, solitary 06/11/2011    Allergies  Allergen Reactions  . Influenza Vaccines     High blood pressure- had to be admitted  . Lipitor [Atorvastatin Calcium]     Muscle pain    Past Surgical History:  Procedure Laterality Date  . AMPUTATION Right 07/19/2015   Procedure: AMPUTATION DIGIT ( RIGHT FOOT FIFTH TOE );  Surgeon: Algernon Huxley, MD;  Location: [facility];  Service: Vascular;  Laterality: Right;  . BOWEL RESECTION  05/20/2015   Procedure: SMALL BOWEL RESECTION;  Surgeon: Clayburn Pert, MD;  Location: [facility];  Service: General;;  . CATARACT EXTRACTION W/PHACO Left 10/06/2018   Procedure: CATARACT EXTRACTION PHACO AND INTRAOCULAR LENS PLACEMENT ([place])  LEFT;  Surgeon: Birder Robson, MD;  Location: [place];  Service: Ophthalmology;  Laterality: Left;  DAY OF TEST. LIVES IN BOARDING HOUSE  . CATARACT EXTRACTION W/PHACO Right 10/27/2018   Procedure: CATARACT EXTRACTION PHACO AND INTRAOCULAR LENS PLACEMENT ([place])  RIGHT;  Surgeon: Birder Robson, MD;  Location: [place];  Service: Ophthalmology;  Laterality: Right;  PT HAS TO HAVE COVID TEST MORNING OF LEAVE  AT LAST PATIENT  . CHOLECYSTECTOMY    . COLONOSCOPY  2000  . CYSTECTOMY W/ CONTINENT DIVERSION  1996  . HERNIA REPAIR    . LAPAROTOMY N/A 05/20/2015   Procedure: EXPLORATORY LAPAROTOMY;  Surgeon: Clayburn Pert, MD;  Location: [facility];  Service: General;  Laterality: N/A;  . PERIPHERAL VASCULAR CATHETERIZATION N/A 07/03/2015    Procedure: Abdominal Aortogram w/Lower Extremity;  Surgeon: Algernon Huxley, MD;  Location: [place] [facility];  Service: Cardiovascular;  Laterality: N/A;  . PERIPHERAL VASCULAR CATHETERIZATION  07/03/2015   Procedure: Lower Extremity Intervention;  Surgeon: Algernon Huxley, MD;  Location: [place] [facility];  Service: Cardiovascular;;  . PROSTATECTOMY  1996  . TONSILLECTOMY      Social History   Tobacco Use  . Smoking status: Current Every Day Smoker    Packs/day: 0.25    Years: 65.00    Pack years: 16.25    Types: Cigarettes  . Smokeless tobacco: Never Used  . Tobacco comment: reduced # of packs smoked to 5-6 ciggs daily from 3 pks daily   Substance Use Topics  . Alcohol use: Yes    Alcohol/week: 12.0 standard drinks    Types: 12 Cans of beer per week  . Drug use: No     Medication list has been reviewed and updated.  Current Meds  Medication Sig  . acetaminophen (TYLENOL) 325 MG tablet Take 650 mg by mouth every 6 (six) hours as needed.  Marland Kitchen albuterol (VENTOLIN HFA) 108 (90 Base) MCG/ACT inhaler INHALE 2 PUFFS BY MOUTH EVERY 6 HOURS ASNEEDED WHEEZING/ SHORTNESS OF BREATH  . aspirin EC 81 MG tablet Take 81 mg by mouth every other day.  Marland Kitchen buPROPion (WELLBUTRIN SR) 150 MG 12 hr tablet TAKE 1 TABLET BY MOUTH TWICE DAILY  . cilostazol (PLETAL) 50 MG tablet Take 1 tablet (50 mg total) by mouth 2 (two) times daily.  . Cyanocobalamin (VITAMIN B-12 IJ) Inject as directed once a week.  Marland Kitchen ipratropium-albuterol (DUONEB) 0.5-2.5 (3) MG/3ML SOLN Take 3 mLs by nebulization 3 (three) times daily.  . Potassium 99 MG TABS Take by mouth.  . Turmeric 500 MG CAPS Take 1 tablet by mouth daily.    PHQ 2/9 Scores 04/07/2019 11/11/2018 04/24/2018 07/10/2017  PHQ - 2 Score 2 0 0 0  PHQ- 9 Score 2 - - 0    BP Readings from Last 3 Encounters:  04/07/19 (!) 148/70  11/11/18 118/70  10/27/18 (!) 151/88    Physical Exam Vitals and nursing note reviewed.  Constitutional:      General: He is not in  acute distress.    Appearance: He is well-developed.  HENT:     Head: Normocephalic and atraumatic.  Cardiovascular:     Rate and Rhythm: Normal rate and regular rhythm.     Pulses: Normal pulses.  Pulmonary:     Effort: Pulmonary effort is normal. No respiratory distress.     Breath sounds: Decreased air movement present. Decreased breath sounds present.  Musculoskeletal:     Right lower leg: No edema.     Left lower leg: No edema.  Lymphadenopathy:     Cervical: No cervical adenopathy.  Skin:    General: Skin is warm and dry.     Findings: No rash.  Neurological:     Mental Status: He is alert and oriented to person, place, and time.  Psychiatric:        Behavior: Behavior normal.        Thought Content:  Thought content normal.     Wt Readings from Last 3 Encounters:  04/07/19 174 lb (78.9 kg)  11/11/18 169 lb (76.7 kg)  10/27/18 172 lb (78 kg)   BP 150/89 on Right   148/88 on Left BP (!) 148/70   Pulse 83   Ht 5' 6.5" (1.689 m)   Wt 174 lb (78.9 kg)   SpO2 95%   BMI 27.66 kg/m   Assessment and Plan: 1. Essential hypertension Need to start medication daily Last creatinine normal Will recheck in 2 months with labs - lisinopril (ZESTRIL) 10 MG tablet; Take 1 tablet (10 mg total) by mouth daily.  Dispense: 90 tablet; Refill: 1  2. Status post amputation of lesser toe of right foot ([place]) unchanged  3. Panlobular emphysema (HCC) Stable, chronic SOB and wheezing He uses albuterol PRN He is not on any long term controlled medication by his choice  4. B12 deficiency Continue monthly injections - cyanocobalamin ((VITAMIN B-12)) injection 1,000 mcg   Partially dictated using Editor, commissioning. Any errors are unintentional.  Halina Maidens, MD [facility]  04/07/2019

## 2019-05-28 NOTE — Congregational Nurse Program (Unsigned)
  Dept: [place] Nurse Program Note  Date of Encounter: 05/28/2019  Saw patient at [facility]. Checked his blood pressure per his request. States he has had some shortness of breath with exertion. No sputum. Vital signs: 97.1 temporal-heart rate 87-respirations 20- blood pressure 142/70 oxygen sat. 97% on room air.   Past Medical History: Past Medical History:  Diagnosis Date  . Anxiety   . Bladder cancer ([place])   . COPD (chronic obstructive pulmonary disease) ([place])   . Depression   . GERD (gastroesophageal reflux disease)   . History of kidney cancer   . Peripheral vascular disease ([place])   . Pneumonia     Encounter Details: CNP Questionnaire - 05/28/19 1232      Questionnaire   Race  White or Caucasian    Location Patient [place]  Medicare    Uninsured  Not Applicable    Food  No food insecurities    Housing/Utilities  Yes, have permanent housing    Transportation  No transportation needs    Interpersonal Safety  Yes, feel physically and emotionally safe where you currently live    Medication  No medication insecurities    Medical Provider  Yes    Referrals  Not Applicable    ED Visit Averted  Not Applicable    Life-Saving Intervention Made  Not Applicable

## 2019-06-07 ENCOUNTER — Encounter: Payer: Self-pay | Admitting: Internal Medicine

## 2019-06-07 ENCOUNTER — Other Ambulatory Visit: Payer: Self-pay

## 2019-06-07 ENCOUNTER — Ambulatory Visit (INDEPENDENT_AMBULATORY_CARE_PROVIDER_SITE_OTHER): Payer: Medicare Other | Admitting: Internal Medicine

## 2019-06-07 ENCOUNTER — Ambulatory Visit: Payer: Medicare Other

## 2019-06-07 VITALS — BP 110/68 | HR 72 | Temp 96.6°F | Ht 66.5 in | Wt 172.0 lb

## 2019-06-07 DIAGNOSIS — E538 Deficiency of other specified B group vitamins: Secondary | ICD-10-CM | POA: Diagnosis not present

## 2019-06-07 DIAGNOSIS — I480 Paroxysmal atrial fibrillation: Secondary | ICD-10-CM

## 2019-06-07 DIAGNOSIS — J431 Panlobular emphysema: Secondary | ICD-10-CM

## 2019-06-07 DIAGNOSIS — I10 Essential (primary) hypertension: Secondary | ICD-10-CM | POA: Diagnosis not present

## 2019-06-07 DIAGNOSIS — I739 Peripheral vascular disease, unspecified: Secondary | ICD-10-CM | POA: Diagnosis not present

## 2019-06-07 MED ORDER — CYANOCOBALAMIN 1000 MCG/ML IJ SOLN
1000.0000 ug | Freq: Once | INTRAMUSCULAR | Status: AC
  Administered 2019-06-07: 1000 ug via INTRAMUSCULAR

## 2019-06-07 NOTE — Progress Notes (Signed)
Date:  06/07/2019   Name:  Brady Smith   DOB:  [birth year]   MRN:  [id number]   Chief Complaint: Hypertension (Follow up.) and b12 def  Hypertension This is a new problem. The problem has been gradually improving since onset. The problem is controlled. Associated symptoms include shortness of breath. Pertinent negatives include no blurred vision, chest pain, headaches, orthopnea, palpitations or peripheral edema. Past treatments include ACE inhibitors (started last visit).  COPD - stable symptoms of SOB on exertion.  No recent exacerbations.  He is up to date on his pneumonia vaccines.  He does not want Covid since he had a COPD exacerbation after flu shot in 2018. PVD/possible Raynauds - he only complains of cold hands but no blanching or necrosis.  He has no leg symptoms with walking.  He is s/p toe amputation in the past.  He continues on aspirin therapy but no statins,  He continues to smoke. Paroxysmal atrial fibrillation - on aspirin.  He is not aware of any irregular heart beat.  Immunization History  Administered Date(s) Administered  . Influenza, High Dose Seasonal PF 01/23/2017  . Pneumococcal Conjugate-13 02/25/2018  . Pneumococcal Polysaccharide-23 01/24/2017     Lab Results  Component Value Date   CREATININE 1.16 11/11/2018   BUN 13 11/11/2018   NA 141 11/11/2018   K 4.9 11/11/2018   CL 106 11/11/2018   CO2 21 11/11/2018   Lab Results  Component Value Date   TRIG 81 05/23/2015   Lab Results  Component Value Date   TSH 1.530 11/11/2018   Lab Results  Component Value Date   HGBA1C 5.3 06/13/2017     Review of Systems  Constitutional: Negative for chills, fatigue and fever.  HENT: Negative for trouble swallowing.   Eyes: Positive for visual disturbance. Negative for blurred vision.  Respiratory: Positive for shortness of breath. Negative for cough, chest tightness and wheezing.   Cardiovascular: Negative for chest pain, palpitations and orthopnea.    Gastrointestinal: Negative for abdominal pain and blood in stool.  Genitourinary: Negative for hematuria.  Neurological: Negative for dizziness, tremors, weakness and headaches.  Psychiatric/Behavioral: Negative for dysphoric mood and sleep disturbance. The patient is not nervous/anxious.     Patient Active Problem List   Diagnosis Date Noted  . B12 nutritional deficiency 06/07/2019  . Memory changes 04/24/2018  . Excoriation of scalp 04/24/2018  . Paroxysmal atrial fibrillation ([place]) 06/13/2017  . Elevated blood sugar 02/03/2017  . S/P amputation of lesser toe ([place]) 07/25/2015  . Peripheral vascular disease ([place]) 07/14/2015  . AA (alcohol abuse) 05/23/2015  . Urinary retention 05/18/2015  . Essential hypertension 05/18/2015  . Choroidal nevus, right eye 02/08/2015  . Carpal tunnel syndrome 01/13/2015  . H/O gastrointestinal disease 01/13/2015  . History of primary malignant neoplasm of urinary bladder 01/13/2015  . Compulsive tobacco user syndrome 01/13/2015  . HLD (hyperlipidemia) 01/13/2015  . Neurosis, phobic 01/13/2015  . Chronic obstructive pulmonary disease ([place]) 06/11/2011  . Lung nodule, solitary 06/11/2011    Allergies  Allergen Reactions  . Influenza Vaccines     High blood pressure- had to be admitted  . Lipitor [Atorvastatin Calcium]     Muscle pain    Past Surgical History:  Procedure Laterality Date  . AMPUTATION Right 07/19/2015   Procedure: AMPUTATION DIGIT ( RIGHT FOOT FIFTH TOE );  Surgeon: Algernon Huxley, MD;  Location: [facility];  Service: Vascular;  Laterality: Right;  . BOWEL RESECTION  05/20/2015   Procedure:  SMALL BOWEL RESECTION;  Surgeon: Clayburn Pert, MD;  Location: [facility];  Service: General;;  . CATARACT EXTRACTION W/PHACO Left 10/06/2018   Procedure: CATARACT EXTRACTION PHACO AND INTRAOCULAR LENS PLACEMENT ([place])  LEFT;  Surgeon: Birder Robson, MD;  Location: [place];  Service: Ophthalmology;  Laterality: Left;  DAY OF TEST.  LIVES IN BOARDING HOUSE  . CATARACT EXTRACTION W/PHACO Right 10/27/2018   Procedure: CATARACT EXTRACTION PHACO AND INTRAOCULAR LENS PLACEMENT ([place])  RIGHT;  Surgeon: Birder Robson, MD;  Location: [place];  Service: Ophthalmology;  Laterality: Right;  PT HAS TO HAVE COVID TEST MORNING OF LEAVE AT LAST PATIENT  . CHOLECYSTECTOMY    . COLONOSCOPY  2000  . CYSTECTOMY W/ CONTINENT DIVERSION  1996  . HERNIA REPAIR    . LAPAROTOMY N/A 05/20/2015   Procedure: EXPLORATORY LAPAROTOMY;  Surgeon: Clayburn Pert, MD;  Location: [facility];  Service: General;  Laterality: N/A;  . PERIPHERAL VASCULAR CATHETERIZATION N/A 07/03/2015   Procedure: Abdominal Aortogram w/Lower Extremity;  Surgeon: Algernon Huxley, MD;  Location: [place] [facility];  Service: Cardiovascular;  Laterality: N/A;  . PERIPHERAL VASCULAR CATHETERIZATION  07/03/2015   Procedure: Lower Extremity Intervention;  Surgeon: Algernon Huxley, MD;  Location: [place] [facility];  Service: Cardiovascular;;  . PROSTATECTOMY  1996  . TONSILLECTOMY      Social History   Tobacco Use  . Smoking status: Current Every Day Smoker    Packs/day: 0.25    Years: 65.00    Pack years: 16.25    Types: Cigarettes  . Smokeless tobacco: Never Used  . Tobacco comment: reduced # of packs smoked to 5-6 ciggs daily from 3 pks daily   Substance Use Topics  . Alcohol use: Yes    Alcohol/week: 12.0 standard drinks    Types: 12 Cans of beer per week  . Drug use: No     Medication list has been reviewed and updated.  Current Meds  Medication Sig  . acetaminophen (TYLENOL) 325 MG tablet Take 650 mg by mouth every 6 (six) hours as needed.  Marland Kitchen albuterol (VENTOLIN HFA) 108 (90 Base) MCG/ACT inhaler INHALE 2 PUFFS BY MOUTH EVERY 6 HOURS ASNEEDED WHEEZING/ SHORTNESS OF BREATH  . aspirin EC 81 MG tablet Take 81 mg by mouth every other day.  . Cyanocobalamin (VITAMIN B-12 IJ) Inject as directed once a week.  Marland Kitchen ipratropium-albuterol (DUONEB) 0.5-2.5 (3) MG/3ML  SOLN Take 3 mLs by nebulization 3 (three) times daily.  Marland Kitchen lisinopril (ZESTRIL) 10 MG tablet Take 1 tablet (10 mg total) by mouth daily.   Current Facility-Administered Medications for the 06/07/19 encounter (Office Visit) with Glean Hess, MD  Medication  . cyanocobalamin ((VITAMIN B-12)) injection 1,000 mcg    PHQ 2/9 Scores 06/07/2019 04/07/2019 11/11/2018 04/24/2018  PHQ - 2 Score 4 2 0 0  PHQ- 9 Score 5 2 - -    BP Readings from Last 3 Encounters:  06/07/19 110/68  04/07/19 (!) 148/70  11/11/18 118/70    Physical Exam Vitals and nursing note reviewed.  Constitutional:      General: He is not in acute distress.    Appearance: He is well-developed.  HENT:     Head: Normocephalic and atraumatic.  Cardiovascular:     Rate and Rhythm: Normal rate and regular rhythm.     Pulses: Normal pulses.     Heart sounds: No murmur.  Pulmonary:     Effort: Pulmonary effort is normal. No respiratory distress.  Breath sounds: Decreased breath sounds present. No wheezing or rhonchi.  Musculoskeletal:     Cervical back: Normal range of motion.     Right lower leg: No edema.     Left lower leg: No edema.  Lymphadenopathy:     Cervical: No cervical adenopathy.  Skin:    General: Skin is warm and dry.     Findings: No rash.     Comments: Fingers cool to touch but not white/red/blue  Neurological:     Mental Status: He is alert and oriented to person, place, and time.  Psychiatric:        Attention and Perception: Attention normal.        Mood and Affect: Mood normal.     Wt Readings from Last 3 Encounters:  06/07/19 172 lb (78 kg)  04/07/19 174 lb (78.9 kg)  11/11/18 169 lb (76.7 kg)    BP 110/68   Pulse 72   Temp (!) 96.6 F (35.9 C) (Temporal)   Ht 5' 6.5" (1.689 m)   Wt 172 lb (78 kg)   BMI 27.35 kg/m   Assessment and Plan: 1. Essential hypertension Clinically stable exam with well controlled BP on lisinopril. Tolerating medications without side effects at this  time. Pt to continue current regimen and low sodium diet; benefits of regular exercise as able discussed. I recommend medications but he declined today  2. B12 nutritional deficiency Injection today - cyanocobalamin ((VITAMIN B-12)) injection 1,000 mcg  3. Peripheral vascular disease ([place]) Stable without ulceration or claudication  4. Paroxysmal atrial fibrillation (HCC) In SR today Continue Aspirin daily  5. Panlobular emphysema ([place]) Did not tolerate Duonebs - caused more cough and SOB He will continue Albuterol MDI as needed Pneumonia vaccines up to date Pt declines Covid vaccines   Partially dictated using Editor, commissioning. Any errors are unintentional.  Halina Maidens, MD [facility]  06/07/2019

## 2019-06-17 ENCOUNTER — Telehealth: Payer: Self-pay | Admitting: Internal Medicine

## 2019-06-17 NOTE — Telephone Encounter (Signed)
Called pt. No answer/No vm. Pt due to schedule Medicare Annual Wellness Visit (AWV) either virtually/audio only or in office. Whichever the patients preference is.  Last AWV 07/10/17; please schedule at anytime with [facility] Health Advisor.

## 2019-07-30 NOTE — [facility]regational Nurse Program (Signed)
[facility] and oxygen. States he has an appointment with Dr. Aida Puffer on Monday 5/10. States hs has had recent in increase with Shortness of breath. States easily SOB with exertion and sitting. States easily awoken during the night with shortness of breath as well. He states he is a smoker of 2 + packs of cigarettes daily. States has whitish phelgm with coughing at times. Denies any yellow or green color with phelgm. Bilateral lobes clear. Vital signs: 94% on room air, heart rate 93, respiratory rate 22, and BP 142/70. Encouraged deep breathing exercises multiple times daily. States he does have occasional mid sternum chest discomfort with shortness of breath. Client demonstrated deep breathing exercises with a recheck of his pulse oximeter at 97%. States he will meet with his doctor on Monday for a follow up. Reviewed with him on when to call for emergency assistance.

## 2019-08-02 ENCOUNTER — Other Ambulatory Visit: Payer: Self-pay

## 2019-08-02 ENCOUNTER — Ambulatory Visit (INDEPENDENT_AMBULATORY_CARE_PROVIDER_SITE_OTHER): Payer: Medicare Other | Admitting: Internal Medicine

## 2019-08-02 ENCOUNTER — Encounter: Payer: Self-pay | Admitting: Internal Medicine

## 2019-08-02 VITALS — BP 128/84 | HR 84 | Temp 97.4°F | Ht 66.5 in | Wt 169.0 lb

## 2019-08-02 DIAGNOSIS — L299 Pruritus, unspecified: Secondary | ICD-10-CM

## 2019-08-02 DIAGNOSIS — I10 Essential (primary) hypertension: Secondary | ICD-10-CM

## 2019-08-02 DIAGNOSIS — R233 Spontaneous ecchymoses: Secondary | ICD-10-CM | POA: Diagnosis not present

## 2019-08-02 DIAGNOSIS — E538 Deficiency of other specified B group vitamins: Secondary | ICD-10-CM

## 2019-08-02 DIAGNOSIS — E782 Mixed hyperlipidemia: Secondary | ICD-10-CM | POA: Diagnosis not present

## 2019-08-02 DIAGNOSIS — I480 Paroxysmal atrial fibrillation: Secondary | ICD-10-CM | POA: Diagnosis not present

## 2019-08-02 MED ORDER — CYANOCOBALAMIN 1000 MCG/ML IJ SOLN
1000.0000 ug | Freq: Once | INTRAMUSCULAR | Status: AC
  Administered 2019-08-02: 1000 ug via INTRAMUSCULAR

## 2019-08-02 MED ORDER — ALBUTEROL SULFATE HFA 108 (90 BASE) MCG/ACT IN AERS: 2.0000 | INHALATION_SPRAY | Freq: Four times a day (QID) | RESPIRATORY_TRACT | 5 refills | Status: DC | PRN

## 2019-08-02 MED ORDER — GABAPENTIN 100 MG PO CAPS: 100.0000 mg | ORAL_CAPSULE | Freq: Every day | ORAL | 0 refills | Status: DC

## 2019-08-02 NOTE — Progress Notes (Signed)
Date:  08/02/2019   Name:  Brady Smith   DOB:  03-Aug-1945   MRN:  [id number]   Chief Complaint: Rash (Wants a referral for cardiology for his SOB. Michela Pitcher he wants to know if he can try gabapentin to help with itching. )  Rash This is a chronic (itching) problem. The rash is diffuse. Associated symptoms include shortness of breath. Pertinent negatives include no diarrhea, fatigue or fever.  Shortness of Breath This is a chronic problem. The problem occurs constantly. The problem has been unchanged. Associated symptoms include a rash. Pertinent negatives include no chest pain, fever, headaches, leg swelling, orthopnea, syncope or wheezing.  Hypertension This is a chronic problem. The problem is controlled. Associated symptoms include shortness of breath. Pertinent negatives include no chest pain, headaches, orthopnea or palpitations. Past treatments include ACE inhibitors. The current treatment provides significant improvement.  Hyperlipidemia This is a chronic problem. The problem is uncontrolled. Associated symptoms include shortness of breath. Pertinent negatives include no chest pain. He is currently on no antihyperlipidemic treatment.    ECHO 2017: - Procedure narrative: Transthoracic echocardiography. The study  was technically difficult.  - Left ventricle: The cavity size was normal. Systolic function was  normal. The estimated ejection fraction was in the range of 60%  to 65%. Wall motion was normal; there were no regional wall  motion abnormalities. The study is not technically sufficient to  allow evaluation of LV diastolic function.  - Left atrium: The atrium was normal in size.  - Right ventricle: Systolic function was normal.  - Pulmonary arteries: Systolic pressure was mildly elevated. PA  peak pressure: 35+ mm Hg (S).   Impressions:  - Rhythm is atrial fibrillation/flutter.  Lab Results  Component Value Date   CREATININE 1.16 11/11/2018   BUN 13  11/11/2018   NA 141 11/11/2018   K 4.9 11/11/2018   CL 106 11/11/2018   CO2 21 11/11/2018   Lab Results  Component Value Date   TRIG 81 05/23/2015   Lab Results  Component Value Date   TSH 1.530 11/11/2018   Lab Results  Component Value Date   HGBA1C 5.3 06/13/2017   Lab Results  Component Value Date   WBC 7.5 04/24/2018   HGB 16.5 04/24/2018   HCT 48.4 04/24/2018   MCV 94 04/24/2018   PLT 195 04/24/2018   Lab Results  Component Value Date   ALT 16 04/24/2018   AST 15 04/24/2018   ALKPHOS 77 04/24/2018   BILITOT 0.4 04/24/2018     Review of Systems  Constitutional: Negative for chills, fatigue and fever.  Respiratory: Positive for shortness of breath. Negative for chest tightness and wheezing.   Cardiovascular: Negative for chest pain, palpitations, orthopnea, leg swelling and syncope.  Gastrointestinal: Positive for abdominal distention. Negative for constipation and diarrhea.  Skin: Positive for color change and rash.       Itching  Neurological: Negative for dizziness, light-headedness and headaches.  Psychiatric/Behavioral: Negative for sleep disturbance. The patient is not nervous/anxious.     Patient Active Problem List   Diagnosis Date Noted  . B12 nutritional deficiency 06/07/2019  . Memory changes 04/24/2018  . Excoriation of scalp 04/24/2018  . Paroxysmal atrial fibrillation ([place]) 06/13/2017  . Elevated blood sugar 02/03/2017  . S/P amputation of lesser toe ([place]) 07/25/2015  . Peripheral vascular disease ([place]) 07/14/2015  . AA (alcohol abuse) 05/23/2015  . Urinary retention 05/18/2015  . Essential hypertension 05/18/2015  . Choroidal nevus, right eye 02/08/2015  .  Carpal tunnel syndrome 01/13/2015  . H/O gastrointestinal disease 01/13/2015  . History of primary malignant neoplasm of urinary bladder 01/13/2015  . Compulsive tobacco user syndrome 01/13/2015  . HLD (hyperlipidemia) 01/13/2015  . Neurosis, phobic 01/13/2015  . Chronic obstructive  pulmonary disease ([place]) 06/11/2011  . Lung nodule, solitary 06/11/2011    Allergies  Allergen Reactions  . Influenza Vaccines     High blood pressure- had to be admitted  . Lipitor [Atorvastatin Calcium]     Muscle pain    Past Surgical History:  Procedure Laterality Date  . AMPUTATION Right 07/19/2015   Procedure: AMPUTATION DIGIT ( RIGHT FOOT FIFTH TOE );  Surgeon: Algernon Huxley, MD;  Location: [facility];  Service: Vascular;  Laterality: Right;  . BOWEL RESECTION  05/20/2015   Procedure: SMALL BOWEL RESECTION;  Surgeon: Clayburn Pert, MD;  Location: [facility];  Service: General;;  . CATARACT EXTRACTION W/PHACO Left 10/06/2018   Procedure: CATARACT EXTRACTION PHACO AND INTRAOCULAR LENS PLACEMENT ([place])  LEFT;  Surgeon: Birder Robson, MD;  Location: [place];  Service: Ophthalmology;  Laterality: Left;  DAY OF TEST. LIVES IN BOARDING HOUSE  . CATARACT EXTRACTION W/PHACO Right 10/27/2018   Procedure: CATARACT EXTRACTION PHACO AND INTRAOCULAR LENS PLACEMENT ([place])  RIGHT;  Surgeon: Birder Robson, MD;  Location: [place];  Service: Ophthalmology;  Laterality: Right;  PT HAS TO HAVE COVID TEST MORNING OF LEAVE AT LAST PATIENT  . CHOLECYSTECTOMY    . COLONOSCOPY  2000  . CYSTECTOMY W/ CONTINENT DIVERSION  1996  . HERNIA REPAIR    . LAPAROTOMY N/A 05/20/2015   Procedure: EXPLORATORY LAPAROTOMY;  Surgeon: Clayburn Pert, MD;  Location: [facility];  Service: General;  Laterality: N/A;  . PERIPHERAL VASCULAR CATHETERIZATION N/A 07/03/2015   Procedure: Abdominal Aortogram w/Lower Extremity;  Surgeon: Algernon Huxley, MD;  Location: [place] [facility];  Service: Cardiovascular;  Laterality: N/A;  . PERIPHERAL VASCULAR CATHETERIZATION  07/03/2015   Procedure: Lower Extremity Intervention;  Surgeon: Algernon Huxley, MD;  Location: [place] [facility];  Service: Cardiovascular;;  . PROSTATECTOMY  1996  . TONSILLECTOMY      Social History   Tobacco Use  . Smoking status: Current  Every Day Smoker    Packs/day: 0.25    Years: 65.00    Pack years: 16.25    Types: Cigarettes  . Smokeless tobacco: Never Used  . Tobacco comment: reduced # of packs smoked to 5-6 ciggs daily from 3 pks daily   Substance Use Topics  . Alcohol use: Yes    Alcohol/week: 12.0 standard drinks    Types: 12 Cans of beer per week  . Drug use: No     Medication list has been reviewed and updated.  Current Meds  Medication Sig  . acetaminophen (TYLENOL) 325 MG tablet Take 650 mg by mouth every 6 (six) hours as needed.  Marland Kitchen albuterol (VENTOLIN HFA) 108 (90 Base) MCG/ACT inhaler INHALE 2 PUFFS BY MOUTH EVERY 6 HOURS ASNEEDED WHEEZING/ SHORTNESS OF BREATH  . aspirin EC 81 MG tablet Take 81 mg by mouth every other day.  . Cyanocobalamin (VITAMIN B-12 IJ) Inject as directed once a week.  Marland Kitchen ipratropium-albuterol (DUONEB) 0.5-2.5 (3) MG/3ML SOLN Take 3 mLs by nebulization 3 (three) times daily.  Marland Kitchen lisinopril (ZESTRIL) 10 MG tablet Take 1 tablet (10 mg total) by mouth daily.    PHQ 2/9 Scores 06/07/2019 04/07/2019 11/11/2018 04/24/2018  PHQ - 2 Score 4 2 0 0  PHQ- 9 Score 5  2 - -    BP Readings from Last 3 Encounters:  08/02/19 128/84  06/07/19 110/68  04/07/19 (!) 148/70    Physical Exam Vitals and nursing note reviewed.  Constitutional:      General: He is not in acute distress.    Appearance: He is well-developed.  HENT:     Head: Normocephalic and atraumatic.  Cardiovascular:     Rate and Rhythm: Normal rate and regular rhythm.     Heart sounds: No murmur.  Pulmonary:     Effort: Pulmonary effort is normal. No respiratory distress.  Abdominal:     General: There is distension (from previous hernia surgeries).     Tenderness: There is no abdominal tenderness.  Musculoskeletal:     Cervical back: Normal range of motion.     Right lower leg: No edema.     Left lower leg: No edema.  Lymphadenopathy:     Cervical: No cervical adenopathy.  Skin:    General: Skin is warm and dry.      Capillary Refill: Capillary refill takes less than 2 seconds.     Findings: Ecchymosis (senile ecchymosis) present. No rash.  Neurological:     General: No focal deficit present.     Mental Status: He is alert and oriented to person, place, and time.  Psychiatric:        Behavior: Behavior normal.        Thought Content: Thought content normal.     Wt Readings from Last 3 Encounters:  08/02/19 169 lb (76.7 kg)  06/07/19 172 lb (78 kg)  04/07/19 174 lb (78.9 kg)    BP 128/84   Pulse 84   Temp (!) 97.4 F (36.3 C) (Temporal)   Ht 5' 6.5" (1.689 m)   Wt 169 lb (76.7 kg)   SpO2 94%   BMI 26.87 kg/m   Assessment and Plan: 1. Essential hypertension Clinically stable exam with well controlled BP on lisinopril. Tolerating medications without side effects at this time. Pt to continue current regimen and low sodium diet; benefits of regular exercise as able discussed.  2. Pruritic disorder Unclear cause but can try low dose gabapentin - gabapentin (NEURONTIN) 100 MG capsule; Take 1 capsule (100 mg total) by mouth at bedtime.  Dispense: 30 capsule; Refill: 0  3. Paroxysmal atrial fibrillation (HCC) EKG shows no Afib today Will refer to cardiology - seen by Dr. Ubaldo Glassing in the past - EKG 12-Lead - SR @ 83; low voltage  4. B12 nutritional deficiency Due for B12 injection today  5. Senile ecchymosis Daily aspirin therapy is likely contributing    Partially dictated using Editor, commissioning. Any errors are unintentional.  Halina Maidens, MD [facility]  08/02/2019

## 2019-08-03 LAB — COMPREHENSIVE METABOLIC PANEL
ALT: 14 IU/L (ref 0–44)
AST: 15 IU/L (ref 0–40)
Albumin/Globulin Ratio: 1.7 (ref 1.2–2.2)
Albumin: 4.3 g/dL (ref 3.7–4.7)
Alkaline Phosphatase: 67 IU/L (ref 39–117)
BUN/Creatinine Ratio: 13 (ref 10–24)
BUN: 13 mg/dL (ref 8–27)
Bilirubin Total: 0.5 mg/dL (ref 0.0–1.2)
CO2: 20 mmol/L (ref 20–29)
Calcium: 9.5 mg/dL (ref 8.6–10.2)
Chloride: 103 mmol/L (ref 96–106)
Creatinine, Ser: 0.97 mg/dL (ref 0.76–1.27)
GFR calc Af Amer: 89 mL/min/{1.73_m2} (ref >59)
GFR calc non Af Amer: 77 mL/min/{1.73_m2} (ref >59)
Globulin, Total: 2.5 g/dL (ref 1.5–4.5)
Glucose: 81 mg/dL (ref 65–99)
Potassium: 4.7 mmol/L (ref 3.5–5.2)
Sodium: 139 mmol/L (ref 134–144)
Total Protein: 6.8 g/dL (ref 6.0–8.5)

## 2019-08-03 LAB — LIPID PANEL
Chol/HDL Ratio: 3.7 ratio (ref 0.0–5.0)
Cholesterol, Total: 159 mg/dL (ref 100–199)
HDL: 43 mg/dL (ref >39)
LDL Chol Calc (NIH): 87 mg/dL (ref 0–99)
Triglycerides: 165 mg/dL — ABNORMAL HIGH (ref 0–149)
VLDL Cholesterol Cal: 29 mg/dL (ref 5–40)

## 2019-08-03 LAB — CBC WITH DIFFERENTIAL/PLATELET
Basophils Absolute: 0.1 10*3/uL (ref 0.0–0.2)
Basos: 1 %
EOS (ABSOLUTE): 0.1 10*3/uL (ref 0.0–0.4)
Eos: 1 %
Hematocrit: 47 % (ref 37.5–51.0)
Hemoglobin: 15.9 g/dL (ref 13.0–17.7)
Immature Grans (Abs): 0.1 10*3/uL (ref 0.0–0.1)
Immature Granulocytes: 1 %
Lymphocytes Absolute: 1.3 10*3/uL (ref 0.7–3.1)
Lymphs: 15 %
MCH: 32.9 pg (ref 26.6–33.0)
MCHC: 33.8 g/dL (ref 31.5–35.7)
MCV: 97 fL (ref 79–97)
Monocytes Absolute: 0.7 10*3/uL (ref 0.1–0.9)
Monocytes: 8 %
Neutrophils Absolute: 6.1 10*3/uL (ref 1.4–7.0)
Neutrophils: 74 %
Platelets: 186 10*3/uL (ref 150–450)
RBC: 4.84 x10E6/uL (ref 4.14–5.80)
RDW: 13.5 % (ref 11.6–15.4)
WBC: 8.3 10*3/uL (ref 3.4–10.8)

## 2019-08-03 LAB — TSH: TSH: 1.1 u[IU]/mL (ref 0.450–4.500)

## 2019-08-12 ENCOUNTER — Ambulatory Visit (INDEPENDENT_AMBULATORY_CARE_PROVIDER_SITE_OTHER): Payer: Medicare Other

## 2019-08-12 ENCOUNTER — Other Ambulatory Visit: Payer: Self-pay

## 2019-08-12 ENCOUNTER — Ambulatory Visit (INDEPENDENT_AMBULATORY_CARE_PROVIDER_SITE_OTHER): Payer: Medicare Other | Admitting: Cardiology

## 2019-08-12 ENCOUNTER — Encounter: Payer: Self-pay | Admitting: Cardiology

## 2019-08-12 VITALS — BP 112/68 | HR 81 | Ht 67.0 in | Wt 168.1 lb

## 2019-08-12 DIAGNOSIS — R0609 Other forms of dyspnea: Secondary | ICD-10-CM

## 2019-08-12 DIAGNOSIS — F17200 Nicotine dependence, unspecified, uncomplicated: Secondary | ICD-10-CM | POA: Diagnosis not present

## 2019-08-12 DIAGNOSIS — I10 Essential (primary) hypertension: Secondary | ICD-10-CM

## 2019-08-12 DIAGNOSIS — R0600 Dyspnea, unspecified: Secondary | ICD-10-CM | POA: Diagnosis not present

## 2019-08-12 DIAGNOSIS — Z8679 Personal history of other diseases of the circulatory system: Secondary | ICD-10-CM

## 2019-08-12 NOTE — Patient Instructions (Addendum)
Medication Instructions:  No medication changes.  *If you need a refill on your cardiac medications before your next appointment, please call your pharmacy*   Lab Work: None Ordered If you have labs (blood work) drawn today and your tests are completely normal, you will receive your results only by: Marland Kitchen MyChart Message (if you have MyChart) OR . A paper copy in the mail If you have any lab test that is abnormal or we need to change your treatment, we will call you to review the results.   Testing/Procedures: Your physician has requested that you have an echocardiogram (after June 3rd). Echocardiography is a painless test that uses sound waves to create images of your heart. It provides your doctor with information about the size and shape of your heart and how well your heart's chambers and valves are working. This procedure takes approximately one hour. There are no restrictions for this procedure.  Your physician has requested that you have a lexiscan myoview (after June 3rd).    [place]  Your caregiver has ordered a Stress Test with nuclear imaging. The purpose of this test is to evaluate the blood supply to your heart muscle. This procedure is referred to as a "Non-Invasive Stress Test." This is because other than having an IV started in your vein, nothing is inserted or "invades" your body. Cardiac stress tests are done to find areas of poor blood flow to the heart by determining the extent of coronary artery disease (CAD). Some patients exercise on a treadmill, which naturally increases the blood flow to your heart, while others who are  unable to walk on a treadmill due to physical limitations have a pharmacologic/chemical stress agent called Lexiscan . This medicine will mimic walking on a treadmill by temporarily increasing your coronary blood flow.   Please note: these test may take anywhere between 2-4 hours to complete  PLEASE REPORT TO [place] AT THE FIRST DESK WILL DIRECT YOU WHERE TO GO  Date of Procedure:_____________________________________  Arrival Time for Procedure:______________________________  Instructions regarding medication:   **You may take your regular medications the morning of the test with enough water to get them down safely.  PLEASE NOTIFY THE OFFICE AT LEAST 61 HOURS IN ADVANCE IF YOU ARE UNABLE TO KEEP YOUR APPOINTMENT.  [phone] AND  PLEASE NOTIFY NUCLEAR MEDICINE AT [facility] AT LEAST 24 HOURS IN ADVANCE IF YOU ARE UNABLE TO KEEP YOUR APPOINTMENT. [phone]  How to prepare for your Myoview test:  1. Do not eat or drink after midnight 2. No caffeine for 24 hours prior to test 3. No smoking 24 hours prior to test. 4. Your medication may be taken with water.  If your doctor stopped a medication because of this test, do not take that medication. 5. Ladies, please do not wear dresses.  Skirts or pants are appropriate. Please wear a short sleeve shirt. 6. No perfume, cologne or lotion. 7. Wear comfortable walking shoes. No heels!   Your physician has recommended that you wear a 14 day Zio (heart) monitor- placed in office today. This monitor is a medical device that records the heart's electrical activity. Doctors most often use these monitors to diagnose arrhythmias. Arrhythmias are problems with the speed or rhythm of the heartbeat. The monitor is a small device applied to your chest. You can wear one while you do your normal daily activities. While wearing this monitor if you have any symptoms to push the button and record what  you felt. Once you have worn this monitor for the period of time provider prescribed (Usually 14 days), you will return the monitor device in the postage paid box. Once it is returned they will download the data collected and provide [place] with a report which the provider will then review and we will call you with those results. Important tips:  1. Avoid showering during the  first 24 hours of wearing the monitor. 2. Avoid excessive sweating to help maximize wear time. 3. Do not submerge the device, no hot tubs, and no swimming pools. 4. Keep any lotions or oils away from the patch. 5. After 24 hours you may shower with the patch on. Take brief showers with your back facing the shower head.  6. Do not remove patch once it has been placed because that will interrupt data and decrease adhesive wear time. 7. Push the button when you have any symptoms and write down what you were feeling. 8. Once you have completed wearing your monitor, remove and place into box which has postage paid and place in your outgoing mailbox.  9. If for some reason you have misplaced your box then call our office and we can provide another box and/or mail it off for you.             .  Follow-Up: At [facility], you and your health needs are our priority.  As part of our continuing mission to provide you with exceptional heart care, we have created designated Provider Care Teams.  These Care Teams include your primary Cardiologist (physician) and Advanced Practice Providers (APPs -  Physician Assistants and Nurse Practitioners) who all work together to provide you with the care you need, when you need it.  We recommend signing up for the patient portal called "MyChart".  Sign up information is provided on this After Visit Summary.  MyChart is used to connect with patients for Virtual Visits (Telemedicine).  Patients are able to view lab/test results, encounter notes, upcoming appointments, etc.  Non-urgent messages can be sent to your provider as well.   To learn more about what you can do with MyChart, go to [email or web address].    Your next appointment:   5 week(s)  The format for your next appointment:   In Person  Provider:   Kate Sable, MD   Other Instructions  Echocardiogram An echocardiogram is a procedure that uses painless sound waves (ultrasound) to  produce an image of the heart. Images from an echocardiogram can provide important information about:  Signs of coronary artery disease (CAD).  Aneurysm detection. An aneurysm is a weak or damaged part of an artery wall that bulges out from the normal force of blood pumping through the body.  Heart size and shape. Changes in the size or shape of the heart can be associated with certain conditions, including heart failure, aneurysm, and CAD.  Heart muscle function.  Heart valve function.  Signs of a past heart attack.  Fluid buildup around the heart.  Thickening of the heart muscle.  A tumor or infectious growth around the heart valves. Tell a health care provider about:  Any allergies you have.  All medicines you are taking, including vitamins, herbs, eye drops, creams, and over-the-counter medicines.  Any blood disorders you have.  Any surgeries you have had.  Any medical conditions you have.  Whether you are pregnant or may be pregnant. What are the risks? Generally, this is a safe procedure. However, problems may occur, including:  Allergic reaction to dye (contrast) that may be used during the procedure. What happens before the procedure? No specific preparation is needed. You may eat and drink normally. What happens during the procedure?   An IV tube may be inserted into one of your veins.  You may receive contrast through this tube. A contrast is an injection that improves the quality of the pictures from your heart.  A gel will be applied to your chest.  A wand-like tool (transducer) will be moved over your chest. The gel will help to transmit the sound waves from the transducer.  The sound waves will harmlessly bounce off of your heart to allow the heart images to be captured in real-time motion. The images will be recorded on a computer. The procedure may vary among health care providers and hospitals. What happens after the procedure?  You may return to  your normal, everyday life, including diet, activities, and medicines, unless your health care provider tells you not to do that. Summary  An echocardiogram is a procedure that uses painless sound waves (ultrasound) to produce an image of the heart.  Images from an echocardiogram can provide important information about the size and shape of your heart, heart muscle function, heart valve function, and fluid buildup around your heart.  You do not need to do anything to prepare before this procedure. You may eat and drink normally.  After the echocardiogram is completed, you may return to your normal, everyday life, unless your health care provider tells you not to do that. This information is not intended to replace advice given to you by your health care provider. Make sure you discuss any questions you have with your health care provider. Document Revised: 07/02/2018 Document Reviewed: 04/13/2016 Elsevier Patient Education  2020 [place].    Cardiac Nuclear Scan A cardiac nuclear scan is a test that measures blood flow to the heart when a person is resting and when he or she is exercising. The test looks for problems such as:  Not enough blood reaching a portion of the heart.  The heart muscle not working normally. You may need this test if:  You have heart disease.  You have had abnormal lab results.  You have had heart surgery or a balloon procedure to open up blocked arteries (angioplasty).  You have chest pain.  You have shortness of breath. In this test, a radioactive dye (tracer) is injected into your bloodstream. After the tracer has traveled to your heart, an imaging device is used to measure how much of the tracer is absorbed by or distributed to various areas of your heart. This procedure is usually done at a hospital and takes 2-4 hours. Tell a health care provider about:  Any allergies you have.  All medicines you are taking, including vitamins, herbs, eye  drops, creams, and over-the-counter medicines.  Any problems you or family members have had with anesthetic medicines.  Any blood disorders you have.  Any surgeries you have had.  Any medical conditions you have.  Whether you are pregnant or may be pregnant. What are the risks? Generally, this is a safe procedure. However, problems may occur, including:  Serious chest pain and heart attack. This is only a risk if the stress portion of the test is done.  Rapid heartbeat.  Sensation of warmth in your chest. This usually passes quickly.  Allergic reaction to the tracer. What happens before the procedure?  Ask your health care provider about changing or stopping  your regular medicines. This is especially important if you are taking diabetes medicines or blood thinners.  Follow instructions from your health care provider about eating or drinking restrictions.  Remove your jewelry on the day of the procedure. What happens during the procedure?  An IV will be inserted into one of your veins.  Your health care provider will inject a small amount of radioactive tracer through the IV.  You will wait for 20-40 minutes while the tracer travels through your bloodstream.  Your heart activity will be monitored with an electrocardiogram (ECG).  You will lie down on an exam table.  Images of your heart will be taken for about 15-20 minutes.  You may also have a stress test. For this test, one of the following may be done: ? You will exercise on a treadmill or stationary bike. While you exercise, your heart's activity will be monitored with an ECG, and your blood pressure will be checked. ? You will be given medicines that will increase blood flow to parts of your heart. This is done if you are unable to exercise.  When blood flow to your heart has peaked, a tracer will again be injected through the IV.  After 20-40 minutes, you will get back on the exam table and have more images taken  of your heart.  Depending on the type of tracer used, scans may need to be repeated 3-4 hours later.  Your IV line will be removed when the procedure is over. The procedure may vary among health care providers and hospitals. What happens after the procedure?  Unless your health care provider tells you otherwise, you may return to your normal schedule, including diet, activities, and medicines.  Unless your health care provider tells you otherwise, you may increase your fluid intake. This will help to flush the contrast dye from your body. Drink enough fluid to keep your urine pale yellow.  Ask your health care provider, or the department that is doing the test: ? When will my results be ready? ? How will I get my results? Summary  A cardiac nuclear scan measures the blood flow to the heart when a person is resting and when he or she is exercising.  Tell your health care provider if you are pregnant.  Before the procedure, ask your health care provider about changing or stopping your regular medicines. This is especially important if you are taking diabetes medicines or blood thinners.  After the procedure, unless your health care provider tells you otherwise, increase your fluid intake. This will help flush the contrast dye from your body.  After the procedure, unless your health care provider tells you otherwise, you may return to your normal schedule, including diet, activities, and medicines. This information is not intended to replace advice given to you by your health care provider. Make sure you discuss any questions you have with your health care provider. Document Revised: 08/25/2017 Document Reviewed: 08/25/2017 Elsevier Patient Education  [place].

## 2019-08-12 NOTE — Progress Notes (Signed)
Cardiology Office Note:    Date:  08/12/2019   ID:  Brady Smith, DOB 1945-03-30, MRN [id number]  PCP:  Glean Hess, MD  Cardiologist:  Kate Sable, MD  Electrophysiologist:  None   Referring MD: Glean Hess, MD   Chief Complaint  Patient presents with  . New Patient (Initial Visit)    Pt concerned w/ having heart trouble/ chest pain/ SOB/ was put on Lipitor a while ago states it has affected his walking. Meds verbally reviewed w/ pt.     History of Present Illness:    Brady Smith is a 74 y.o. male with a hx of anxiety, hypertension, current smoker x60+ years, COPD, PAD who presents due to history of paroxysmal atrial fibrillation and shortness of breath.  Patient states having symptoms of shortness of breath with exertion.  He states being told he has CHF 20 years ago by prior cardiologist.  He had a left heart cath 20 years ago and was told no stent was needed.  At the time he was started on Lipitor which caused severe muscle aches.  He denies edema.  Has nonspecific chest pains not related with exertion.  He has also been told he has occasional skipped heartbeats.  He has never been diagnosed or told he has atrial fibrillation.   He was seen by [facility] cardiology in 2017 due to chest pain.  At the time echocardiogram was obtained which showed normal LV systolic function, EF 60 to 65%, normal LA size.  The rhythm during echo procedure was noted to be atrial fibrillation.  Evaluating EMR, patient was in the hospital for an incarcerated ventral hernia, had a laparotomy for acute small bowel infarction complicated by acute respiratory failure.    Past Medical History:  Diagnosis Date  . Anxiety   . Bladder cancer ([place])   . COPD (chronic obstructive pulmonary disease) ([place])   . Depression   . GERD (gastroesophageal reflux disease)   . History of kidney cancer   . Peripheral vascular disease ([place])   . Pneumonia     Past Surgical History:  Procedure Laterality Date  .  AMPUTATION Right 07/19/2015   Procedure: AMPUTATION DIGIT ( RIGHT FOOT FIFTH TOE );  Surgeon: Algernon Huxley, MD;  Location: [facility];  Service: Vascular;  Laterality: Right;  . BOWEL RESECTION  05/20/2015   Procedure: SMALL BOWEL RESECTION;  Surgeon: Clayburn Pert, MD;  Location: [facility];  Service: General;;  . CATARACT EXTRACTION W/PHACO Left 10/06/2018   Procedure: CATARACT EXTRACTION PHACO AND INTRAOCULAR LENS PLACEMENT ([place])  LEFT;  Surgeon: Birder Robson, MD;  Location: [place];  Service: Ophthalmology;  Laterality: Left;  DAY OF TEST. LIVES IN BOARDING HOUSE  . CATARACT EXTRACTION W/PHACO Right 10/27/2018   Procedure: CATARACT EXTRACTION PHACO AND INTRAOCULAR LENS PLACEMENT ([place])  RIGHT;  Surgeon: Birder Robson, MD;  Location: [place];  Service: Ophthalmology;  Laterality: Right;  PT HAS TO HAVE COVID TEST MORNING OF LEAVE AT LAST PATIENT  . CHOLECYSTECTOMY    . COLONOSCOPY  2000  . CYSTECTOMY W/ CONTINENT DIVERSION  1996  . HERNIA REPAIR    . LAPAROTOMY N/A 05/20/2015   Procedure: EXPLORATORY LAPAROTOMY;  Surgeon: Clayburn Pert, MD;  Location: [facility];  Service: General;  Laterality: N/A;  . PERIPHERAL VASCULAR CATHETERIZATION N/A 07/03/2015   Procedure: Abdominal Aortogram w/Lower Extremity;  Surgeon: Algernon Huxley, MD;  Location: [place] [facility];  Service: Cardiovascular;  Laterality: N/A;  . PERIPHERAL VASCULAR CATHETERIZATION  07/03/2015   Procedure: Lower Extremity Intervention;  Surgeon: Algernon Huxley, MD;  Location: [place] [facility];  Service: Cardiovascular;;  . PROSTATECTOMY  1996  . TONSILLECTOMY      Current Medications: Current Meds  Medication Sig  . acetaminophen (TYLENOL) 325 MG tablet Take 650 mg by mouth every 6 (six) hours as needed.  Marland Kitchen albuterol (VENTOLIN HFA) 108 (90 Base) MCG/ACT inhaler Inhale 2 puffs into the lungs every 6 (six) hours as needed for wheezing or shortness of breath.  Marland Kitchen aspirin EC 81 MG tablet Take 81 mg by mouth  every other day.  . Cyanocobalamin (VITAMIN B-12 IJ) Inject as directed once a week.  . gabapentin (NEURONTIN) 100 MG capsule Take 1 capsule (100 mg total) by mouth at bedtime.  Marland Kitchen ipratropium-albuterol (DUONEB) 0.5-2.5 (3) MG/3ML SOLN Take 3 mLs by nebulization 3 (three) times daily.  Marland Kitchen lisinopril (ZESTRIL) 10 MG tablet Take 1 tablet (10 mg total) by mouth daily.     Allergies:   Influenza vaccines and Lipitor [atorvastatin calcium]   Social History   Socioeconomic History  . Marital status: Widowed    Spouse name: Not on file  . Number of children: 2  . Years of education: Not on file  . Highest education level: 8th grade  Occupational History  . Occupation: disabled  Tobacco Use  . Smoking status: Current Every Day Smoker    Packs/day: 0.25    Years: 65.00    Pack years: 16.25    Types: Cigarettes  . Smokeless tobacco: Never Used  . Tobacco comment: reduced # of packs smoked to 5-6 ciggs daily from 3 pks daily   Substance and Sexual Activity  . Alcohol use: Yes    Alcohol/week: 12.0 standard drinks    Types: 12 Cans of beer per week  . Drug use: No  . Sexual activity: Not Currently  Other Topics Concern  . Not on file  Social History Narrative  . Not on file   Social Determinants of Health   Financial Resource Strain:   . Difficulty of Paying Living Expenses:   Food Insecurity:   . Worried About Charity fundraiser in the Last Year:   . Arboriculturist in the Last Year:   Transportation Needs:   . Film/video editor (Medical):   Marland Kitchen Lack of Transportation (Non-Medical):   Physical Activity:   . Days of Exercise per Week:   . Minutes of Exercise per Session:   Stress:   . Feeling of Stress :   Social Connections:   . Frequency of Communication with Friends and Family:   . Frequency of Social Gatherings with Friends and Family:   . Attends Religious Services:   . Active Member of Clubs or Organizations:   . Attends Archivist Meetings:   Marland Kitchen  Marital Status:      Family History: The patient's family history includes COPD in his sister; Heart disease in his father and mother.  ROS:   Please see the history of present illness.     All other systems reviewed and are negative.  EKGs/Labs/Other Studies Reviewed:    The following studies were reviewed today:   EKG:  EKG is  ordered today.  The ekg ordered today demonstrates sinus rhythm  Recent Labs: 08/02/2019: ALT 14; BUN 13; Creatinine, Ser 0.97; Hemoglobin 15.9; Platelets 186; Potassium 4.7; Sodium 139; TSH 1.100  Recent Lipid Panel    Component Value Date/Time   CHOL 159 08/02/2019  1049   TRIG 165 (H) 08/02/2019 1049   HDL 43 08/02/2019 1049   CHOLHDL 3.7 08/02/2019 1049   LDLCALC 87 08/02/2019 1049    Physical Exam:    VS:  BP 112/68 (BP Location: Right Arm, Patient Position: Sitting, Cuff Size: Normal)   Pulse 81   Ht 5\' 7"  (1.702 m)   Wt 168 lb 2 oz (76.3 kg)   SpO2 97%   BMI 26.33 kg/m     Wt Readings from Last 3 Encounters:  08/12/19 168 lb 2 oz (76.3 kg)  08/02/19 169 lb (76.7 kg)  06/07/19 172 lb (78 kg)     GEN:  Well nourished, well developed in no acute distress HEENT: Normal NECK: No JVD; No carotid bruits LYMPHATICS: No lymphadenopathy CARDIAC: RRR, no murmurs, rubs, gallops RESPIRATORY: Decreased breath sounds, mild crackles at bases ABDOMEN: Soft, non-tender, distended, ventral hernia noted MUSCULOSKELETAL:  No edema; No deformity  SKIN: Warm and dry NEUROLOGIC:  Alert and oriented x 3 PSYCHIATRIC:  Normal affect   ASSESSMENT:    1. Dyspnea on exertion   2. History of atrial fibrillation   3. Smoking   4. Essential hypertension    PLAN:    In order of problems listed above:  1. Patient with worsening dyspnea on exertion.  This could be an anginal equivalent.  History of COPD and current smoking could be contributing.  Has risk factors of smoking, hypertension.  Will get echocardiogram to evaluate systolic and diastolic  function.  Get a Lexiscan Myoview to evaluate for CAD. 2. Patient with reported paroxysmal atrial fibrillation while obtaining echocardiogram in 2017.  EMR review of ECGs and notes without any objective evidence of atrial fibrillation.  He was admitted for a ventral hernia repair, infarcted small bowel, complicated by respiratory failure. Not sure if the rhythm disturbance was secondary to the underlying illness but very possible.  He has been in sinus rhythm thus far.  We will place a 2-week monitor to evaluate for atrial fibrillation if any.  There is no objective evidence of A. fib and as such we will not anticoagulate for now. 3. Patient is a current smoker x60+ years.  Cessation advised. 4. 3 of hypertension, BP controlled.  Continue lisinopril as prescribed.  Follow-up after echocardiogram, Myoview, 0 monitor.  This note was generated in part or whole with voice recognition software. Voice recognition is usually quite accurate but there are transcription errors that can and very often do occur. I apologize for any typographical errors that were not detected and corrected.  Medication Adjustments/Labs and Tests Ordered: Current medicines are reviewed at length with the patient today.  Concerns regarding medicines are outlined above.  Orders Placed This Encounter  Procedures  . NM Myocar Multi W/Spect W/Wall Motion / EF  . LONG TERM MONITOR (3-14 DAYS)  . EKG 12-Lead  . ECHOCARDIOGRAM COMPLETE   No orders of the defined types were placed in this encounter.   Patient Instructions  Medication Instructions:  No medication changes.  *If you need a refill on your cardiac medications before your next appointment, please call your pharmacy*   Lab Work: None Ordered If you have labs (blood work) drawn today and your tests are completely normal, you will receive your results only by: Marland Kitchen MyChart Message (if you have MyChart) OR . A paper copy in the mail If you have any lab test that is  abnormal or we need to change your treatment, we will call you to review the results.  Testing/Procedures: Your physician has requested that you have an echocardiogram (after June 3rd). Echocardiography is a painless test that uses sound waves to create images of your heart. It provides your doctor with information about the size and shape of your heart and how well your heart's chambers and valves are working. This procedure takes approximately one hour. There are no restrictions for this procedure.  Your physician has requested that you have a lexiscan myoview (after June 3rd).    [place]  Your caregiver has ordered a Stress Test with nuclear imaging. The purpose of this test is to evaluate the blood supply to your heart muscle. This procedure is referred to as a "Non-Invasive Stress Test." This is because other than having an IV started in your vein, nothing is inserted or "invades" your body. Cardiac stress tests are done to find areas of poor blood flow to the heart by determining the extent of coronary artery disease (CAD). Some patients exercise on a treadmill, which naturally increases the blood flow to your heart, while others who are  unable to walk on a treadmill due to physical limitations have a pharmacologic/chemical stress agent called Lexiscan . This medicine will mimic walking on a treadmill by temporarily increasing your coronary blood flow.   Please note: these test may take anywhere between 2-4 hours to complete  PLEASE REPORT TO [place] AT THE FIRST DESK WILL DIRECT YOU WHERE TO GO  Date of Procedure:_____________________________________  Arrival Time for Procedure:______________________________  Instructions regarding medication:   **You may take your regular medications the morning of the test with enough water to get them down safely.  PLEASE NOTIFY THE OFFICE AT LEAST 75 HOURS IN ADVANCE IF YOU ARE UNABLE TO KEEP YOUR  APPOINTMENT.  [phone] AND  PLEASE NOTIFY NUCLEAR MEDICINE AT [facility] AT LEAST 24 HOURS IN ADVANCE IF YOU ARE UNABLE TO KEEP YOUR APPOINTMENT. [phone]  How to prepare for your Myoview test:  1. Do not eat or drink after midnight 2. No caffeine for 24 hours prior to test 3. No smoking 24 hours prior to test. 4. Your medication may be taken with water.  If your doctor stopped a medication because of this test, do not take that medication. 5. Ladies, please do not wear dresses.  Skirts or pants are appropriate. Please wear a short sleeve shirt. 6. No perfume, cologne or lotion. 7. Wear comfortable walking shoes. No heels!   Your physician has recommended that you wear a 14 day Zio (heart) monitor- placed in office today. This monitor is a medical device that records the heart's electrical activity. Doctors most often use these monitors to diagnose arrhythmias. Arrhythmias are problems with the speed or rhythm of the heartbeat. The monitor is a small device applied to your chest. You can wear one while you do your normal daily activities. While wearing this monitor if you have any symptoms to push the button and record what you felt. Once you have worn this monitor for the period of time provider prescribed (Usually 14 days), you will return the monitor device in the postage paid box. Once it is returned they will download the data collected and provide [place] with a report which the provider will then review and we will call you with those results. Important tips:  1. Avoid showering during the first 24 hours of wearing the monitor. 2. Avoid excessive sweating to help maximize wear time. 3. Do not submerge the device, no hot tubs,  and no swimming pools. 4. Keep any lotions or oils away from the patch. 5. After 24 hours you may shower with the patch on. Take brief showers with your back facing the shower head.  6. Do not remove patch once it has been placed because that will interrupt data and  decrease adhesive wear time. 7. Push the button when you have any symptoms and write down what you were feeling. 8. Once you have completed wearing your monitor, remove and place into box which has postage paid and place in your outgoing mailbox.  9. If for some reason you have misplaced your box then call our office and we can provide another box and/or mail it off for you.             .  Follow-Up: At [facility], you and your health needs are our priority.  As part of our continuing mission to provide you with exceptional heart care, we have created designated Provider Care Teams.  These Care Teams include your primary Cardiologist (physician) and Advanced Practice Providers (APPs -  Physician Assistants and Nurse Practitioners) who all work together to provide you with the care you need, when you need it.  We recommend signing up for the patient portal called "MyChart".  Sign up information is provided on this After Visit Summary.  MyChart is used to connect with patients for Virtual Visits (Telemedicine).  Patients are able to view lab/test results, encounter notes, upcoming appointments, etc.  Non-urgent messages can be sent to your provider as well.   To learn more about what you can do with MyChart, go to [email or web address].    Your next appointment:   5 week(s)  The format for your next appointment:   In Person  Provider:   Kate Sable, MD   Other Instructions  Echocardiogram An echocardiogram is a procedure that uses painless sound waves (ultrasound) to produce an image of the heart. Images from an echocardiogram can provide important information about:  Signs of coronary artery disease (CAD).  Aneurysm detection. An aneurysm is a weak or damaged part of an artery wall that bulges out from the normal force of blood pumping through the body.  Heart size and shape. Changes in the size or shape of the heart can be associated with certain conditions,  including heart failure, aneurysm, and CAD.  Heart muscle function.  Heart valve function.  Signs of a past heart attack.  Fluid buildup around the heart.  Thickening of the heart muscle.  A tumor or infectious growth around the heart valves. Tell a health care provider about:  Any allergies you have.  All medicines you are taking, including vitamins, herbs, eye drops, creams, and over-the-counter medicines.  Any blood disorders you have.  Any surgeries you have had.  Any medical conditions you have.  Whether you are pregnant or may be pregnant. What are the risks? Generally, this is a safe procedure. However, problems may occur, including:  Allergic reaction to dye (contrast) that may be used during the procedure. What happens before the procedure? No specific preparation is needed. You may eat and drink normally. What happens during the procedure?   An IV tube may be inserted into one of your veins.  You may receive contrast through this tube. A contrast is an injection that improves the quality of the pictures from your heart.  A gel will be applied to your chest.  A wand-like tool (transducer) will be moved over your chest. The gel  will help to transmit the sound waves from the transducer.  The sound waves will harmlessly bounce off of your heart to allow the heart images to be captured in real-time motion. The images will be recorded on a computer. The procedure may vary among health care providers and hospitals. What happens after the procedure?  You may return to your normal, everyday life, including diet, activities, and medicines, unless your health care provider tells you not to do that. Summary  An echocardiogram is a procedure that uses painless sound waves (ultrasound) to produce an image of the heart.  Images from an echocardiogram can provide important information about the size and shape of your heart, heart muscle function, heart valve function,  and fluid buildup around your heart.  You do not need to do anything to prepare before this procedure. You may eat and drink normally.  After the echocardiogram is completed, you may return to your normal, everyday life, unless your health care provider tells you not to do that. This information is not intended to replace advice given to you by your health care provider. Make sure you discuss any questions you have with your health care provider. Document Revised: 07/02/2018 Document Reviewed: 04/13/2016 Elsevier Patient Education  2020 [place].    Cardiac Nuclear Scan A cardiac nuclear scan is a test that measures blood flow to the heart when a person is resting and when he or she is exercising. The test looks for problems such as:  Not enough blood reaching a portion of the heart.  The heart muscle not working normally. You may need this test if:  You have heart disease.  You have had abnormal lab results.  You have had heart surgery or a balloon procedure to open up blocked arteries (angioplasty).  You have chest pain.  You have shortness of breath. In this test, a radioactive dye (tracer) is injected into your bloodstream. After the tracer has traveled to your heart, an imaging device is used to measure how much of the tracer is absorbed by or distributed to various areas of your heart. This procedure is usually done at a hospital and takes 2-4 hours. Tell a health care provider about:  Any allergies you have.  All medicines you are taking, including vitamins, herbs, eye drops, creams, and over-the-counter medicines.  Any problems you or family members have had with anesthetic medicines.  Any blood disorders you have.  Any surgeries you have had.  Any medical conditions you have.  Whether you are pregnant or may be pregnant. What are the risks? Generally, this is a safe procedure. However, problems may occur, including:  Serious chest pain and heart attack.  This is only a risk if the stress portion of the test is done.  Rapid heartbeat.  Sensation of warmth in your chest. This usually passes quickly.  Allergic reaction to the tracer. What happens before the procedure?  Ask your health care provider about changing or stopping your regular medicines. This is especially important if you are taking diabetes medicines or blood thinners.  Follow instructions from your health care provider about eating or drinking restrictions.  Remove your jewelry on the day of the procedure. What happens during the procedure?  An IV will be inserted into one of your veins.  Your health care provider will inject a small amount of radioactive tracer through the IV.  You will wait for 20-40 minutes while the tracer travels through your bloodstream.  Your heart activity will be  monitored with an electrocardiogram (ECG).  You will lie down on an exam table.  Images of your heart will be taken for about 15-20 minutes.  You may also have a stress test. For this test, one of the following may be done: ? You will exercise on a treadmill or stationary bike. While you exercise, your heart's activity will be monitored with an ECG, and your blood pressure will be checked. ? You will be given medicines that will increase blood flow to parts of your heart. This is done if you are unable to exercise.  When blood flow to your heart has peaked, a tracer will again be injected through the IV.  After 20-40 minutes, you will get back on the exam table and have more images taken of your heart.  Depending on the type of tracer used, scans may need to be repeated 3-4 hours later.  Your IV line will be removed when the procedure is over. The procedure may vary among health care providers and hospitals. What happens after the procedure?  Unless your health care provider tells you otherwise, you may return to your normal schedule, including diet, activities, and  medicines.  Unless your health care provider tells you otherwise, you may increase your fluid intake. This will help to flush the contrast dye from your body. Drink enough fluid to keep your urine pale yellow.  Ask your health care provider, or the department that is doing the test: ? When will my results be ready? ? How will I get my results? Summary  A cardiac nuclear scan measures the blood flow to the heart when a person is resting and when he or she is exercising.  Tell your health care provider if you are pregnant.  Before the procedure, ask your health care provider about changing or stopping your regular medicines. This is especially important if you are taking diabetes medicines or blood thinners.  After the procedure, unless your health care provider tells you otherwise, increase your fluid intake. This will help flush the contrast dye from your body.  After the procedure, unless your health care provider tells you otherwise, you may return to your normal schedule, including diet, activities, and medicines. This information is not intended to replace advice given to you by your health care provider. Make sure you discuss any questions you have with your health care provider. Document Revised: 08/25/2017 Document Reviewed: 08/25/2017 Elsevier Patient Education  2020 [place], Kate Sable, MD  08/12/2019 10:37 AM    [place]

## 2019-08-17 NOTE — Congregational Nurse Program (Unsigned)
  Dept: [phone]   Congregational Nurse Program Note  Date of Encounter: 08/17/2019  Past Medical History: Past Medical History:  Diagnosis Date  . Anxiety   . Bladder cancer ([place])   . COPD (chronic obstructive pulmonary disease) ([place])   . Depression   . GERD (gastroesophageal reflux disease)   . History of kidney cancer   . Peripheral vascular disease ([place])   . Pneumonia     Encounter Details:

## 2019-09-07 ENCOUNTER — Other Ambulatory Visit: Payer: Self-pay | Admitting: Internal Medicine

## 2019-09-07 DIAGNOSIS — L299 Pruritus, unspecified: Secondary | ICD-10-CM

## 2019-09-07 NOTE — Telephone Encounter (Signed)
Requested Prescriptions  Pending Prescriptions Disp Refills  . gabapentin (NEURONTIN) 100 MG capsule [Pharmacy Med Name: GABAPENTIN 100MG  CAPSULES] 30 capsule 0    Sig: TAKE 1 CAPSULE(100 MG) BY MOUTH AT BEDTIME     Neurology: Anticonvulsants - gabapentin Passed - 09/07/2019  3:30 AM      Passed - Valid encounter within last 12 months    Recent Outpatient Visits          1 month ago Essential hypertension   [facility] Glean Hess, MD   3 months ago Essential hypertension   [place], [place]   5 months ago Essential hypertension   [place], MD   10 months ago Asymptomatic PVCs   [facility] Glean Hess, MD   1 year ago B12 deficiency   [facility] [place] Glean Hess, MD      Future Appointments            In 1 week Ag[place]-Etang, Aaron Edelman, MD [facility], [facility]   In 3 months [name], Jesse Sans, MD [facility], [facility]

## 2019-09-10 DIAGNOSIS — I480 Paroxysmal atrial fibrillation: Secondary | ICD-10-CM | POA: Diagnosis not present

## 2019-09-15 ENCOUNTER — Telehealth: Payer: Self-pay | Admitting: Cardiology

## 2019-09-15 NOTE — Telephone Encounter (Signed)
Spoke with patient and reviewed results are still pending provider review. Advised that it was just uploaded yesterday and once he reviews we will call him with results and recommendations. He verbalized understanding with no further questions at this time.

## 2019-09-15 NOTE — Telephone Encounter (Signed)
Patient would like monitor results. Patient also cancelled his echo appointment and follow up appointment with Dr. Garen Lah

## 2019-09-17 ENCOUNTER — Other Ambulatory Visit: Payer: Medicare Other

## 2019-09-20 ENCOUNTER — Ambulatory Visit: Payer: Medicare Other | Admitting: Cardiology

## 2019-09-21 ENCOUNTER — Telehealth: Payer: Self-pay

## 2019-09-21 NOTE — Telephone Encounter (Signed)
-----   Message from Kate Sable, MD sent at 09/20/2019  6:48 PM EDT ----- No evidence of atrial fibrillation noted on cardiac monitor.

## 2019-09-21 NOTE — Telephone Encounter (Signed)
Call attempted for results.   NA/NV.

## 2019-10-05 ENCOUNTER — Other Ambulatory Visit: Payer: Self-pay

## 2019-10-05 ENCOUNTER — Ambulatory Visit (INDEPENDENT_AMBULATORY_CARE_PROVIDER_SITE_OTHER): Payer: Medicare Other

## 2019-10-05 DIAGNOSIS — E538 Deficiency of other specified B group vitamins: Secondary | ICD-10-CM | POA: Diagnosis not present

## 2019-10-05 MED ORDER — CYANOCOBALAMIN 1000 MCG/ML IJ SOLN
1000.0000 ug | Freq: Once | INTRAMUSCULAR | Status: AC
  Administered 2019-10-05: 1000 ug via INTRAMUSCULAR

## 2019-10-08 NOTE — Telephone Encounter (Signed)
Call attempted for results.   NA/NV.

## 2019-10-12 NOTE — Telephone Encounter (Signed)
Call attempted for results.   NA/NV.  3rd attempt. Letter sent.

## 2019-10-15 ENCOUNTER — Other Ambulatory Visit: Payer: Self-pay | Admitting: Internal Medicine

## 2019-10-15 DIAGNOSIS — I10 Essential (primary) hypertension: Secondary | ICD-10-CM

## 2019-10-21 ENCOUNTER — Other Ambulatory Visit: Payer: Self-pay | Admitting: Internal Medicine

## 2019-10-21 DIAGNOSIS — L299 Pruritus, unspecified: Secondary | ICD-10-CM

## 2019-10-21 MED ORDER — GABAPENTIN 100 MG PO CAPS: ORAL_CAPSULE | ORAL | 0 refills | Status: DC

## 2019-10-21 NOTE — Telephone Encounter (Signed)
Pt is calling and last seen dr berglund may 2021. Pt would like refills on gabapentin that medication is helping him.  [place] [address]

## 2019-10-25 ENCOUNTER — Other Ambulatory Visit: Payer: Self-pay | Admitting: Internal Medicine

## 2019-10-25 DIAGNOSIS — L299 Pruritus, unspecified: Secondary | ICD-10-CM

## 2019-10-25 NOTE — Telephone Encounter (Signed)
gabapentin (NEURONTIN) 100 MG capsule     Patient requesting refill.    Pharmacy:   [facility] [place], [place] AT [facility] [address] [facility] Phone:  [phone]  Fax:  [phone]

## 2019-11-08 ENCOUNTER — Ambulatory Visit (INDEPENDENT_AMBULATORY_CARE_PROVIDER_SITE_OTHER): Payer: Medicare Other

## 2019-11-08 ENCOUNTER — Other Ambulatory Visit: Payer: Self-pay

## 2019-11-08 DIAGNOSIS — E538 Deficiency of other specified B group vitamins: Secondary | ICD-10-CM | POA: Diagnosis not present

## 2019-11-08 MED ORDER — CYANOCOBALAMIN 1000 MCG/ML IJ SOLN
1000.0000 ug | Freq: Once | INTRAMUSCULAR | Status: AC
  Administered 2019-11-08: 1000 ug via INTRAMUSCULAR

## 2019-11-09 ENCOUNTER — Other Ambulatory Visit: Payer: Self-pay | Admitting: Internal Medicine

## 2019-11-09 DIAGNOSIS — I10 Essential (primary) hypertension: Secondary | ICD-10-CM

## 2019-11-20 ENCOUNTER — Other Ambulatory Visit: Payer: Self-pay

## 2019-11-20 ENCOUNTER — Emergency Department: Payer: Medicare Other

## 2019-11-20 ENCOUNTER — Emergency Department
Admission: EM | Admit: 2019-11-20 | Discharge: 2019-11-20 | Discharge status: Against Medical Advice | Payer: Medicare Other | Attending: Emergency Medicine | Admitting: Emergency Medicine

## 2019-11-20 DIAGNOSIS — Z79899 Other long term (current) drug therapy: Secondary | ICD-10-CM | POA: Insufficient documentation

## 2019-11-20 DIAGNOSIS — I10 Essential (primary) hypertension: Secondary | ICD-10-CM | POA: Insufficient documentation

## 2019-11-20 DIAGNOSIS — R0609 Other forms of dyspnea: Secondary | ICD-10-CM | POA: Diagnosis not present

## 2019-11-20 DIAGNOSIS — Z7982 Long term (current) use of aspirin: Secondary | ICD-10-CM | POA: Diagnosis not present

## 2019-11-20 DIAGNOSIS — J449 Chronic obstructive pulmonary disease, unspecified: Secondary | ICD-10-CM | POA: Insufficient documentation

## 2019-11-20 DIAGNOSIS — F17210 Nicotine dependence, cigarettes, uncomplicated: Secondary | ICD-10-CM | POA: Diagnosis not present

## 2019-11-20 DIAGNOSIS — R0602 Shortness of breath: Secondary | ICD-10-CM | POA: Diagnosis present

## 2019-11-20 DIAGNOSIS — R079 Chest pain, unspecified: Secondary | ICD-10-CM | POA: Diagnosis not present

## 2019-11-20 DIAGNOSIS — R0600 Dyspnea, unspecified: Secondary | ICD-10-CM

## 2019-11-20 LAB — CBC
HCT: 48.4 % (ref 39.0–52.0)
Hemoglobin: 16.2 g/dL (ref 13.0–17.0)
MCH: 32.8 pg (ref 26.0–34.0)
MCHC: 33.5 g/dL (ref 30.0–36.0)
MCV: 98 fL (ref 80.0–100.0)
Platelets: 130 10*3/uL — ABNORMAL LOW (ref 150–400)
RBC: 4.94 MIL/uL (ref 4.22–5.81)
RDW: 13.2 % (ref 11.5–15.5)
WBC: 10.1 10*3/uL (ref 4.0–10.5)
nRBC: 0 % (ref 0.0–0.2)

## 2019-11-20 LAB — COMPREHENSIVE METABOLIC PANEL
ALT: 14 U/L (ref 0–44)
AST: 18 U/L (ref 15–41)
Albumin: 4.4 g/dL (ref 3.5–5.0)
Alkaline Phosphatase: 56 U/L (ref 38–126)
Anion gap: 7 (ref 5–15)
BUN: 18 mg/dL (ref 8–23)
CO2: 23 mmol/L (ref 22–32)
Calcium: 9.2 mg/dL (ref 8.9–10.3)
Chloride: 105 mmol/L (ref 98–111)
Creatinine, Ser: 1.11 mg/dL (ref 0.61–1.24)
GFR calc Af Amer: >60 mL/min (ref >60)
GFR calc non Af Amer: >60 mL/min (ref >60)
Glucose, Bld: 129 mg/dL — ABNORMAL HIGH (ref 70–99)
Potassium: 4.3 mmol/L (ref 3.5–5.1)
Sodium: 135 mmol/L (ref 135–145)
Total Bilirubin: 1.1 mg/dL (ref 0.3–1.2)
Total Protein: 7.6 g/dL (ref 6.5–8.1)

## 2019-11-20 LAB — TROPONIN I (HIGH SENSITIVITY)
Troponin I (High Sensitivity): 31 ng/L — ABNORMAL HIGH (ref <18)
Troponin I (High Sensitivity): 38 ng/L — ABNORMAL HIGH (ref <18)

## 2019-11-20 NOTE — ED Triage Notes (Signed)
Pt states that he has been having chest pain and trouble breathing for the past few months but this morning he states it was so bad that he "felt like he was going to die"- pt states that he usually uses his inhaler and it helps but today it did not- pt states he has gotten both his covid shots- pt speaking in complete sentences with no difficulty

## 2019-11-20 NOTE — ED Notes (Signed)
Pt c/o sob and CP x2 days. Pt describes SOB as worsening of emphysema and states he used inhaler 6 times today. Pt denies chronic home oxygen use. Pt is AOX4. NAD noted. Respirations are regular, lung sounds clear. Dyspnea on exertion.

## 2019-11-20 NOTE — ED Notes (Signed)
Pt pulled of monitoring devices, states "I have been here forever and I am too sick for this. It is too busy here for me." Pt educated on AMA status- pt verbalizes understanding but states he cannot wait for MD. [facility] paperwork signed.

## 2019-11-20 NOTE — ED Provider Notes (Signed)
[facility] Emergency Department Provider Note   ____________________________________________   First MD Initiated Contact with Patient 11/20/19 1515     (approximate)  I have reviewed the triage vital signs and the nursing notes.   HISTORY  Chief Complaint Chest Pain and Shortness of Breath    HPI Brady Smith is a 74 y.o. male with past medical history of hypertension, hyperlipidemia, COPD, and peripheral arterial disease who presents to the ED complaining of chest pain and shortness of breath.  Patient reports that he has been dealing with shortness of breath for multiple months but seems to be worse when he exerts himself.  He states he will often have pressure in the center of his chest when his breathing gets bad.  He states in the past his albuterol inhaler helps with his symptoms, but it has not seemed to help over the past few days.  He denies any fevers or cough, has not noticed any pain or swelling in his legs.  He states he was seen by his cardiologist for this problem multiple months ago and they wanted to run additional tests on him, but he did not complete these tests.  He denies any chest pain or shortness of breath at rest currently.        Past Medical History:  Diagnosis Date  . Anxiety   . Bladder cancer ([place])   . COPD (chronic obstructive pulmonary disease) ([place])   . Depression   . GERD (gastroesophageal reflux disease)   . History of kidney cancer   . Peripheral vascular disease ([place])   . Pneumonia     Patient Active Problem List   Diagnosis Date Noted  . Senile ecchymosis 08/02/2019  . B12 nutritional deficiency 06/07/2019  . Memory changes 04/24/2018  . Excoriation of scalp 04/24/2018  . Paroxysmal atrial fibrillation ([place]) 06/13/2017  . Elevated blood sugar 02/03/2017  . S/P amputation of lesser toe ([place]) 07/25/2015  . Peripheral vascular disease ([place]) 07/14/2015  . AA (alcohol abuse) 05/23/2015  . Urinary retention  05/18/2015  . Essential hypertension 05/18/2015  . Choroidal nevus, right eye 02/08/2015  . Carpal tunnel syndrome 01/13/2015  . H/O gastrointestinal disease 01/13/2015  . History of primary malignant neoplasm of urinary bladder 01/13/2015  . Compulsive tobacco user syndrome 01/13/2015  . HLD (hyperlipidemia) 01/13/2015  . Neurosis, phobic 01/13/2015  . Chronic obstructive pulmonary disease ([place]) 06/11/2011  . Lung nodule, solitary 06/11/2011    Past Surgical History:  Procedure Laterality Date  . AMPUTATION Right 07/19/2015   Procedure: AMPUTATION DIGIT ( RIGHT FOOT FIFTH TOE );  Surgeon: Algernon Huxley, MD;  Location: [facility];  Service: Vascular;  Laterality: Right;  . BOWEL RESECTION  05/20/2015   Procedure: SMALL BOWEL RESECTION;  Surgeon: Clayburn Pert, MD;  Location: [facility];  Service: General;;  . CATARACT EXTRACTION W/PHACO Left 10/06/2018   Procedure: CATARACT EXTRACTION PHACO AND INTRAOCULAR LENS PLACEMENT ([place])  LEFT;  Surgeon: Birder Robson, MD;  Location: [place];  Service: Ophthalmology;  Laterality: Left;  DAY OF TEST. LIVES IN BOARDING HOUSE  . CATARACT EXTRACTION W/PHACO Right 10/27/2018   Procedure: CATARACT EXTRACTION PHACO AND INTRAOCULAR LENS PLACEMENT ([place])  RIGHT;  Surgeon: Birder Robson, MD;  [place]  Service: Ophthalmology;  Laterality: Right;  PT HAS TO HAVE COVID TEST MORNING OF LEAVE AT LAST PATIENT  . CHOLECYSTECTOMY    . COLONOSCOPY  2000  . CYSTECTOMY W/ CONTINENT DIVERSION  1996  . HERNIA REPAIR    .  LAPAROTOMY N/A 05/20/2015   Procedure: EXPLORATORY LAPAROTOMY;  Surgeon: Clayburn Pert, MD;  Location: [facility];  Service: General;  Laterality: N/A;  . PERIPHERAL VASCULAR CATHETERIZATION N/A 07/03/2015   Procedure: Abdominal Aortogram w/Lower Extremity;  Surgeon: Algernon Huxley, MD;  Location: [place] [facility];  Service: Cardiovascular;  Laterality: N/A;  . PERIPHERAL VASCULAR CATHETERIZATION  07/03/2015   Procedure:  Lower Extremity Intervention;  Surgeon: Algernon Huxley, MD;  Location: [place] [facility];  Service: Cardiovascular;;  . PROSTATECTOMY  1996  . TONSILLECTOMY      Prior to Admission medications   Medication Sig Start Date End Date Taking? Authorizing Provider  acetaminophen (TYLENOL) 325 MG tablet Take 650 mg by mouth every 6 (six) hours as needed.    [provider]  albuterol (VENTOLIN HFA) 108 (90 Base) MCG/ACT inhaler Inhale 2 puffs into the lungs every 6 (six) hours as needed for wheezing or shortness of breath. 08/02/19   Glean Hess, MD  aspirin EC 81 MG tablet Take 81 mg by mouth every other day.    [provider]  Cyanocobalamin (VITAMIN B-12 IJ) Inject as directed once a week.    [provider]  gabapentin (NEURONTIN) 100 MG capsule TAKE 1 CAPSULE(100 MG) BY MOUTH AT BEDTIME 10/21/19   Glean Hess, MD  ipratropium-albuterol (DUONEB) 0.5-2.5 (3) MG/3ML SOLN Take 3 mLs by nebulization 3 (three) times daily. 02/25/18   Glean Hess, MD  lisinopril (ZESTRIL) 10 MG tablet TAKE 1 TABLET(10 MG) BY MOUTH DAILY 10/15/19   Glean Hess, MD    Allergies Influenza vaccines and Lipitor [atorvastatin calcium]  Family History  Problem Relation Age of Onset  . Heart disease Mother   . Heart disease Father   . COPD Sister     Social History Social History   Tobacco Use  . Smoking status: Current Every Day Smoker    Packs/day: 0.25    Years: 65.00    Pack years: 16.25    Types: Cigarettes  . Smokeless tobacco: Never Used  . Tobacco comment: reduced # of packs smoked to 5-6 ciggs daily from 3 pks daily   Vaping Use  . Vaping Use: Never used  Substance Use Topics  . Alcohol use: Yes    Alcohol/week: 12.0 standard drinks    Types: 12 Cans of beer per week  . Drug use: No    Review of Systems  Constitutional: No fever/chills Eyes: No visual changes. ENT: No sore throat. Cardiovascular: Positive for chest pain. Respiratory: Positive  for shortness of breath. Gastrointestinal: No abdominal pain.  No nausea, no vomiting.  No diarrhea.  No constipation. Genitourinary: Negative for dysuria. Musculoskeletal: Negative for back pain. Skin: Negative for rash. Neurological: Negative for headaches, focal weakness or numbness.  ____________________________________________   PHYSICAL EXAM:  VITAL SIGNS: ED Triage Vitals  Enc Vitals Group     BP 11/20/19 0720 133/78     Pulse Rate 11/20/19 0716 94     Resp 11/20/19 0716 20     Temp 11/20/19 0716 98.2 F (36.8 C)     Temp Source 11/20/19 0716 Oral     SpO2 11/20/19 0716 94 %     Weight 11/20/19 0714 165 lb (74.8 kg)     Height 11/20/19 0714 5\' 7"  (1.702 m)     Head Circumference --      Peak Flow --      Pain Score 11/20/19 0717 10     Pain Loc --  Pain Edu? --      Excl. in [place]? --     Constitutional: Alert and oriented. Eyes: Conjunctivae are normal. Head: Atraumatic. Nose: No congestion/rhinnorhea. Mouth/Throat: Mucous membranes are moist. Neck: Normal ROM Cardiovascular: Normal rate, regular rhythm. Grossly normal heart sounds. Respiratory: Normal respiratory effort.  No retractions.  Mild end expiratory wheezing noted. Gastrointestinal: Soft and nontender. No distention. Genitourinary: deferred Musculoskeletal: No lower extremity tenderness nor edema. Neurologic:  Normal speech and language. No gross focal neurologic deficits are appreciated. Skin:  Skin is warm, dry and intact. No rash noted. Psychiatric: Mood and affect are normal. Speech and behavior are normal.  ____________________________________________   LABS (all labs ordered are listed, but only abnormal results are displayed)  Labs Reviewed  CBC - Abnormal; Notable for the following components:      Result Value   Platelets 130 (*)    All other components within normal limits  COMPREHENSIVE METABOLIC PANEL - Abnormal; Notable for the following components:   Glucose, Bld 129 (*)     All other components within normal limits  TROPONIN I (HIGH SENSITIVITY) - Abnormal; Notable for the following components:   Troponin I (High Sensitivity) 31 (*)    All other components within normal limits  TROPONIN I (HIGH SENSITIVITY) - Abnormal; Notable for the following components:   Troponin I (High Sensitivity) 38 (*)    All other components within normal limits   ____________________________________________  EKG  ED ECG REPORT I, Blake Divine, the attending physician, personally viewed and interpreted this ECG.   Date: 11/20/2019  EKG Time: 7:16  Rate: 84  Rhythm: normal sinus rhythm, frequent PVC's noted  Axis: RAD  Intervals:none  ST&T Change: None   PROCEDURES  Procedure(s) performed (including Critical Care):  Procedures   ____________________________________________   INITIAL IMPRESSION / ASSESSMENT AND PLAN / ED COURSE       74 year old male with past medical history of hypertension, hyperlipidemia, COPD, and peripheral vascular disease who presents to the ED with months of gradually worsening dyspnea on exertion as well as chest pressure with exertion.  He denies any chest pain or shortness of breath on my evaluation, does have some minimal wheezing but this would not seem to fully explain his symptoms.  Initial EKG shows frequent PVCs but no apparent acute ischemic changes, troponin mildly elevated but stable on recheck.  Remainder of labs are unremarkable and I discussed plan with patient to undergo breathing treatments and reassess.  He was advised that he needed to follow-up with cardiology for potential stress testing and echocardiogram.  Unfortunately, he decided to leave the department prior to breathing treatments and reassessment I would not wait to further discuss his concerns with me.      ____________________________________________   FINAL CLINICAL IMPRESSION(S) / ED DIAGNOSES  Final diagnoses:  Dyspnea on exertion     ED Discharge  Orders    None       Note:  This document was prepared using Dragon voice recognition software and may include unintentional dictation errors.   Blake Divine, MD 11/20/19 1850

## 2019-11-22 ENCOUNTER — Telehealth: Payer: Self-pay | Admitting: Internal Medicine

## 2019-11-22 ENCOUNTER — Ambulatory Visit: Payer: Self-pay | Admitting: *Deleted

## 2019-11-22 NOTE — Telephone Encounter (Signed)
Called pt stressed to him that he needed to go to the ER. Had to tell pt multiple times to go to the ER. Pt stated he wanted pain medication. Informed pt that we could not give him pain medication. Pt is hesitant on going to the ER. But I stated to him and made sure he understood from his symptoms Chest pain and SOB that he needs to go asap.  KP

## 2019-11-22 NOTE — Telephone Encounter (Signed)
Patient calls with chronic CP,SOB, body aches stating he cannot lie down to sleep he is in so much pain.Reports "its all from Covid diagnosed 11/08/19. Was seen in the ED with these symptoms on 11/20/19. According to the patient he was offered breathing treatments-he is calling now for pain medication, refuses to return to the ED for any further treatment at this time.  Informed the patient I would let physician know he needed pain medication for his all over aching.  He may be reached on his cell #. He would not listen to advice nor attempt to communicate other than "the ED did him wrong, I need pain medicine". No triage performed-patient unable to communicate appropriately.  Routing to pcp for further communication with the patient. Pharmacy on file.

## 2019-11-22 NOTE — Telephone Encounter (Unsigned)
Copied from [place] [phone]. Topic: Quick Communication - Rx Refill/Question >> Nov 22, 2019 11:37 AM Erick Blinks wrote: Pt is at pharmacy, reporting severe pain all over and that he has not been able to sleep. He is requesting pain medication, pharmacy is asking office to contact pt   Best contact: [phone]

## 2019-11-22 NOTE — Telephone Encounter (Signed)
Please call pt and schedule appt to be seen.  Thank you,  KP

## 2019-11-24 ENCOUNTER — Ambulatory Visit (INDEPENDENT_AMBULATORY_CARE_PROVIDER_SITE_OTHER): Payer: Medicare Other | Admitting: Internal Medicine

## 2019-11-24 ENCOUNTER — Encounter: Payer: Self-pay | Admitting: Internal Medicine

## 2019-11-24 ENCOUNTER — Other Ambulatory Visit: Payer: Self-pay

## 2019-11-24 VITALS — BP 120/74 | HR 106 | Temp 98.7°F | Ht 67.0 in | Wt 161.0 lb

## 2019-11-24 DIAGNOSIS — L299 Pruritus, unspecified: Secondary | ICD-10-CM | POA: Diagnosis not present

## 2019-11-24 DIAGNOSIS — I10 Essential (primary) hypertension: Secondary | ICD-10-CM

## 2019-11-24 DIAGNOSIS — J431 Panlobular emphysema: Secondary | ICD-10-CM | POA: Diagnosis not present

## 2019-11-24 DIAGNOSIS — L309 Dermatitis, unspecified: Secondary | ICD-10-CM | POA: Diagnosis not present

## 2019-11-24 MED ORDER — IPRATROPIUM-ALBUTEROL 0.5-2.5 (3) MG/3ML IN SOLN: 3.0000 mL | Freq: Three times a day (TID) | RESPIRATORY_TRACT | 3 refills | Status: DC

## 2019-11-24 MED ORDER — GABAPENTIN 100 MG PO CAPS: ORAL_CAPSULE | ORAL | 5 refills | Status: DC

## 2019-11-24 MED ORDER — TRIAMCINOLONE ACETONIDE 0.5 % EX OINT: 1.0000 "application " | TOPICAL_OINTMENT | Freq: Two times a day (BID) | CUTANEOUS | 2 refills | Status: DC

## 2019-11-24 NOTE — Progress Notes (Signed)
Date:  11/24/2019   Name:  Brady Smith   DOB:  08-25-1945   MRN:  [id number]   Chief Complaint: COPD (follow up having rib pain / feels better rib pain not as bad ) Patient went to the ER with shortness of breath and chest pain.  He had labs and xrays done but was not seen for follow up due to long wait time.  He became frustrated that they were focusing on his heart and not his breathing.  Of note, he saw cardiology in May and had a holter monitor - normal/no afib but he cancelled his ECHO that was recommended.  COPD He complains of chest tightness, cough, difficulty breathing and shortness of breath. There is no sputum production. This is a chronic problem. The problem occurs daily. The problem has been unchanged. The cough is non-productive. Associated symptoms include appetite change and chest pain. Pertinent negatives include no fever, headaches, sore throat or trouble swallowing. His symptoms are aggravated by minimal activity. His symptoms are alleviated by beta-agonist and ipratropium. His past medical history is significant for COPD.  Chest Pain  This is a chronic problem. The problem has been rapidly improving. The pain is present in the lateral region (over left lateral lower ribs). The pain is mild. The pain does not radiate. Associated symptoms include a cough and shortness of breath. Pertinent negatives include no dizziness, fever, headaches, nausea, palpitations, sputum production or vomiting.  Rash This is a chronic problem. The affected locations include the right lower leg. The rash is characterized by redness, dryness, itchiness and scaling. He was exposed to nothing. Associated symptoms include coughing and shortness of breath. Pertinent negatives include no diarrhea, fatigue, fever, sore throat or vomiting.    Lab Results  Component Value Date   CREATININE 1.11 11/20/2019   BUN 18 11/20/2019   NA 135 11/20/2019   K 4.3 11/20/2019   CL 105 11/20/2019   CO2 23 11/20/2019    Lab Results  Component Value Date   CHOL 159 08/02/2019   HDL 43 08/02/2019   LDLCALC 87 08/02/2019   TRIG 165 (H) 08/02/2019   CHOLHDL 3.7 08/02/2019   Lab Results  Component Value Date   TSH 1.100 08/02/2019   Lab Results  Component Value Date   HGBA1C 5.3 06/13/2017   Lab Results  Component Value Date   WBC 10.1 11/20/2019   HGB 16.2 11/20/2019   HCT 48.4 11/20/2019   MCV 98.0 11/20/2019   PLT 130 (L) 11/20/2019   Lab Results  Component Value Date   ALT 14 11/20/2019   AST 18 11/20/2019   ALKPHOS 56 11/20/2019   BILITOT 1.1 11/20/2019     Review of Systems  Constitutional: Positive for appetite change and unexpected weight change (has lost due to decrease appetite). Negative for chills, fatigue and fever.  HENT: Negative for sore throat and trouble swallowing.   Respiratory: Positive for cough and shortness of breath. Negative for sputum production.   Cardiovascular: Positive for chest pain. Negative for palpitations and leg swelling.  Gastrointestinal: Positive for abdominal distention. Negative for constipation, diarrhea, nausea and vomiting.  Skin: Positive for rash.  Neurological: Negative for dizziness, light-headedness and headaches.  Psychiatric/Behavioral: Negative for dysphoric mood and sleep disturbance. The patient is not nervous/anxious.     Patient Active Problem List   Diagnosis Date Noted  . Senile ecchymosis 08/02/2019  . B12 nutritional deficiency 06/07/2019  . Memory changes 04/24/2018  . Excoriation of scalp 04/24/2018  .  Paroxysmal atrial fibrillation ([place]) 06/13/2017  . Elevated blood sugar 02/03/2017  . S/P amputation of lesser toe ([place]) 07/25/2015  . Peripheral vascular disease ([place]) 07/14/2015  . AA (alcohol abuse) 05/23/2015  . Urinary retention 05/18/2015  . Essential hypertension 05/18/2015  . Choroidal nevus, right eye 02/08/2015  . Carpal tunnel syndrome 01/13/2015  . H/O gastrointestinal disease 01/13/2015  . History of  primary malignant neoplasm of urinary bladder 01/13/2015  . Compulsive tobacco user syndrome 01/13/2015  . HLD (hyperlipidemia) 01/13/2015  . Neurosis, phobic 01/13/2015  . Chronic obstructive pulmonary disease ([place]) 06/11/2011  . Lung nodule, solitary 06/11/2011    Allergies  Allergen Reactions  . Influenza Vaccines     High blood pressure- had to be admitted  . Lipitor [Atorvastatin Calcium]     Muscle pain    Past Surgical History:  Procedure Laterality Date  . AMPUTATION Right 07/19/2015   Procedure: AMPUTATION DIGIT ( RIGHT FOOT FIFTH TOE );  Surgeon: Algernon Huxley, MD;  Location: [facility];  Service: Vascular;  Laterality: Right;  . BOWEL RESECTION  05/20/2015   Procedure: SMALL BOWEL RESECTION;  Surgeon: Clayburn Pert, MD;  Location: [facility];  Service: General;;  . CATARACT EXTRACTION W/PHACO Left 10/06/2018   Procedure: CATARACT EXTRACTION PHACO AND INTRAOCULAR LENS PLACEMENT ([place])  LEFT;  Surgeon: Birder Robson, MD;  Location: [facility];  Service: Ophthalmology;  Laterality: Left;  DAY OF TEST. LIVES IN BOARDING HOUSE  . CATARACT EXTRACTION W/PHACO Right 10/27/2018   Procedure: CATARACT EXTRACTION PHACO AND INTRAOCULAR LENS PLACEMENT ([place])  RIGHT;  Surgeon: Birder Robson, MD;  Location: [facility];  Service: Ophthalmology;  Laterality: Right;  PT HAS TO HAVE COVID TEST MORNING OF LEAVE AT LAST PATIENT  . CHOLECYSTECTOMY    . COLONOSCOPY  2000  . CYSTECTOMY W/ CONTINENT DIVERSION  1996  . HERNIA REPAIR    . LAPAROTOMY N/A 05/20/2015   Procedure: EXPLORATORY LAPAROTOMY;  Surgeon: Clayburn Pert, MD;  Location: [facility];  Service: General;  Laterality: N/A;  . PERIPHERAL VASCULAR CATHETERIZATION N/A 07/03/2015   Procedure: Abdominal Aortogram w/Lower Extremity;  Surgeon: Algernon Huxley, MD;  Location: [place] [facility];  Service: Cardiovascular;  Laterality: N/A;  . PERIPHERAL VASCULAR CATHETERIZATION  07/03/2015   Procedure: Lower Extremity Intervention;   Surgeon: Algernon Huxley, MD;  Location: [place] [facility];  Service: Cardiovascular;;  . PROSTATECTOMY  1996  . TONSILLECTOMY      Social History   Tobacco Use  . Smoking status: Current Every Day Smoker    Packs/day: 0.25    Years: 65.00    Pack years: 16.25    Types: Cigarettes  . Smokeless tobacco: Never Used  . Tobacco comment: reduced # of packs smoked to 5-6 ciggs daily from 3 pks daily   Vaping Use  . Vaping Use: Never used  Substance Use Topics  . Alcohol use: Yes    Alcohol/week: 12.0 standard drinks    Types: 12 Cans of beer per week  . Drug use: No     Medication list has been reviewed and updated.  Current Meds  Medication Sig  . albuterol (VENTOLIN HFA) 108 (90 Base) MCG/ACT inhaler Inhale 2 puffs into the lungs every 6 (six) hours as needed for wheezing or shortness of breath.  Marland Kitchen aspirin EC 81 MG tablet Take 81 mg by mouth every other day.  . Cyanocobalamin (VITAMIN B-12 IJ) Inject as directed every 30 (thirty) days.   Marland Kitchen gabapentin (NEURONTIN) 100 MG capsule  TAKE 1 CAPSULE(100 MG) BY MOUTH AT BEDTIME  . ipratropium-albuterol (DUONEB) 0.5-2.5 (3) MG/3ML SOLN Take 3 mLs by nebulization 3 (three) times daily.  Marland Kitchen lisinopril (ZESTRIL) 10 MG tablet TAKE 1 TABLET(10 MG) BY MOUTH DAILY  . [DISCONTINUED] acetaminophen (TYLENOL) 325 MG tablet Take 650 mg by mouth every 6 (six) hours as needed.    PHQ 2/9 Scores 06/07/2019 04/07/2019 11/11/2018 04/24/2018  PHQ - 2 Score 4 2 0 0  PHQ- 9 Score 5 2 - -    No flowsheet data found.  BP Readings from Last 3 Encounters:  11/24/19 120/74  11/20/19 (!) 146/86  08/12/19 112/68    Physical Exam Vitals and nursing note reviewed.  Constitutional:      General: He is not in acute distress.    Appearance: He is well-developed.  HENT:     Head: Normocephalic and atraumatic.  Cardiovascular:     Rate and Rhythm: Normal rate and regular rhythm.     Pulses:          Radial pulses are 1+ on the right side and 1+ on the left  side.       Dorsalis pedis pulses are 1+ on the right side and 1+ on the left side.     Heart sounds: Heart sounds are distant. No murmur heard.   Pulmonary:     Effort: Pulmonary effort is normal. No respiratory distress.     Breath sounds: Decreased air movement present. Decreased breath sounds present. No wheezing or rhonchi.  Musculoskeletal:        General: Normal range of motion.     Right lower leg: No edema.     Left lower leg: No edema.  Lymphadenopathy:     Cervical: No cervical adenopathy.  Skin:    General: Skin is warm and dry.     Findings: Rash present.          Comments: Pink base with thin white scale  Neurological:     Mental Status: He is alert and oriented to person, place, and time.  Psychiatric:        Attention and Perception: Attention and perception normal.        Mood and Affect: Mood is anxious.     Wt Readings from Last 3 Encounters:  11/24/19 161 lb (73 kg)  11/20/19 165 lb (74.8 kg)  08/12/19 168 lb 2 oz (76.3 kg)    BP 120/74   Pulse (!) 106   Temp 98.7 F (37.1 C) (Oral)   Ht 5\' 7"  (1.702 m)   Wt 161 lb (73 kg)   SpO2 94%   BMI 25.22 kg/m   Assessment and Plan: 1. Panlobular emphysema (HCC) Continue duonebs tid Use albuterol MDI PRN Appears comfortable today without accessory muscle use If he develops more SOB without clear COPD exacerbation, I would encourage a follow up with cardiology to complete the previously recommended evaluation. - ipratropium-albuterol (DUONEB) 0.5-2.5 (3) MG/3ML SOLN; Take 3 mLs by nebulization 3 (three) times daily.  Dispense: 360 mL; Refill: 3  2. Essential hypertension Clinically stable exam with well controlled BP on lisinopril. Tolerating medications without side effects at this time. Pt to continue current regimen and low sodium diet; benefits of regular exercise as able discussed.  3. Pruritic disorder And rib pain - much improved with gabapentin - gabapentin (NEURONTIN) 100 MG capsule; TAKE 1  CAPSULE(100 MG) BY MOUTH AT BEDTIME  Dispense: 30 capsule; Refill: 5  4. Dermatitis May be psoriatic but no  other skin lesions are seen - triamcinolone ointment (KENALOG) 0.5 %; Apply 1 application topically 2 (two) times daily. To rash on leg  Dispense: 30 g; Refill: 2   Partially dictated using Editor, commissioning. Any errors are unintentional.  Halina Maidens, MD [facility]  11/24/2019

## 2019-12-02 ENCOUNTER — Ambulatory Visit: Payer: Self-pay | Admitting: Internal Medicine
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
## 2019-12-02 NOTE — Telephone Encounter (Signed)
Pt  states "Can't breath." Reports called EMS 2 days ago "Told me lower lungs were not working at all."  Seen in ED 11/20/19, left AMA. Office visit with Dr. [name] 11/24/2019.  Pt evasive historian and reluctant to proceed with triage, angry affect. Speech non-halting during call.  Assessment incomplete. Offered appt, declined. When questioned how he would like for me to proceed,, states "I want Dr. [name] to get me into a hospital."  Assured pt I would route to practice for PCPs review. CB# [phone].   Reason for Disposition . [1] Longstanding difficulty breathing (e.g., CHF, COPD, emphysema) AND [2] WORSE than normal  Answer Assessment - Initial Assessment Questions 1. RESPIRATORY STATUS: "Describe your breathing?" (e.g., wheezing, shortness of breath, unable to speak, severe coughing)      Please see summary 2. ONSET: "When did this breathing problem begin?"      *No Answer* 3. PATTERN "Does the difficult breathing come and go, or has it been constant since it started?"      *No Answer* 4. SEVERITY: "How bad is your breathing?" (e.g., mild, moderate, severe)    - MILD: No SOB at rest, mild SOB with walking, speaks normally in sentences, can lay down, no retractions, pulse < 100.    - MODERATE: SOB at rest, SOB with minimal exertion and prefers to sit, cannot lie down flat, speaks in phrases, mild retractions, audible wheezing, pulse 100-120.    - SEVERE: Very SOB at rest, speaks in single words, struggling to breathe, sitting hunched forward, retractions, pulse > 120      *No Answer* 5. RECURRENT SYMPTOM: "Have you had difficulty breathing before?" If Yes, ask: "When was the last time?" and "What happened that time?"      *No Answer* 6. CARDIAC HISTORY: "Do you have any history of heart disease?" (e.g., heart attack, angina, bypass surgery, angioplasty)      *No Answer* 7. LUNG HISTORY: "Do you have any history of lung disease?"  (e.g., pulmonary embolus, asthma, emphysema)      *No Answer* 8. CAUSE: "What do you think is causing the breathing problem?"      *No Answer* 9. OTHER SYMPTOMS: "Do you have any other symptoms? (e.g., dizziness, runny nose, cough, chest pain, fever)     *No Answer* 10. PREGNANCY: "Is there any chance you are pregnant?" "When was your last menstrual period?"       *No Answer* 11. TRAVEL: "Have you traveled out of the country in the last month?" (e.g., travel history, exposures)       *No Answer*  Protocols used: BREATHING DIFFICULTY-A-AH

## 2019-12-04 ENCOUNTER — Emergency Department: Payer: Medicare Other

## 2019-12-04 ENCOUNTER — Other Ambulatory Visit: Payer: Self-pay

## 2019-12-04 ENCOUNTER — Inpatient Hospital Stay
Admission: EM | Admit: 2019-12-04 | Discharge: 2019-12-12 | DRG: 193 | Disposition: A | Discharge status: Home / Self Care | Payer: Medicare Other | Attending: Family Medicine | Admitting: Family Medicine

## 2019-12-04 ENCOUNTER — Encounter: Payer: Self-pay | Admitting: Internal Medicine

## 2019-12-04 DIAGNOSIS — R0602 Shortness of breath: Secondary | ICD-10-CM | POA: Diagnosis not present

## 2019-12-04 DIAGNOSIS — Z20822 Contact with and (suspected) exposure to covid-19: Secondary | ICD-10-CM | POA: Diagnosis present

## 2019-12-04 DIAGNOSIS — Z720 Tobacco use: Secondary | ICD-10-CM | POA: Diagnosis not present

## 2019-12-04 DIAGNOSIS — I480 Paroxysmal atrial fibrillation: Secondary | ICD-10-CM | POA: Diagnosis not present

## 2019-12-04 DIAGNOSIS — Z85528 Personal history of other malignant neoplasm of kidney: Secondary | ICD-10-CM

## 2019-12-04 DIAGNOSIS — Z7982 Long term (current) use of aspirin: Secondary | ICD-10-CM | POA: Diagnosis not present

## 2019-12-04 DIAGNOSIS — Z8249 Family history of ischemic heart disease and other diseases of the circulatory system: Secondary | ICD-10-CM | POA: Diagnosis not present

## 2019-12-04 DIAGNOSIS — R079 Chest pain, unspecified: Secondary | ICD-10-CM | POA: Diagnosis not present

## 2019-12-04 DIAGNOSIS — E785 Hyperlipidemia, unspecified: Secondary | ICD-10-CM | POA: Diagnosis present

## 2019-12-04 DIAGNOSIS — K439 Ventral hernia without obstruction or gangrene: Secondary | ICD-10-CM | POA: Diagnosis not present

## 2019-12-04 DIAGNOSIS — T424X5A Adverse effect of benzodiazepines, initial encounter: Secondary | ICD-10-CM | POA: Diagnosis not present

## 2019-12-04 DIAGNOSIS — E875 Hyperkalemia: Secondary | ICD-10-CM | POA: Diagnosis not present

## 2019-12-04 DIAGNOSIS — K56609 Unspecified intestinal obstruction, unspecified as to partial versus complete obstruction: Secondary | ICD-10-CM

## 2019-12-04 DIAGNOSIS — Z888 Allergy status to other drugs, medicaments and biological substances status: Secondary | ICD-10-CM | POA: Diagnosis not present

## 2019-12-04 DIAGNOSIS — J189 Pneumonia, unspecified organism: Secondary | ICD-10-CM | POA: Diagnosis not present

## 2019-12-04 DIAGNOSIS — G92 Toxic encephalopathy: Secondary | ICD-10-CM | POA: Diagnosis not present

## 2019-12-04 DIAGNOSIS — I10 Essential (primary) hypertension: Secondary | ICD-10-CM

## 2019-12-04 DIAGNOSIS — J449 Chronic obstructive pulmonary disease, unspecified: Secondary | ICD-10-CM | POA: Diagnosis not present

## 2019-12-04 DIAGNOSIS — J441 Chronic obstructive pulmonary disease with (acute) exacerbation: Secondary | ICD-10-CM

## 2019-12-04 DIAGNOSIS — Y929 Unspecified place or not applicable: Secondary | ICD-10-CM

## 2019-12-04 DIAGNOSIS — K219 Gastro-esophageal reflux disease without esophagitis: Secondary | ICD-10-CM | POA: Diagnosis not present

## 2019-12-04 DIAGNOSIS — F064 Anxiety disorder due to known physiological condition: Secondary | ICD-10-CM | POA: Diagnosis present

## 2019-12-04 DIAGNOSIS — Z8551 Personal history of malignant neoplasm of bladder: Secondary | ICD-10-CM

## 2019-12-04 DIAGNOSIS — Z89421 Acquired absence of other right toe(s): Secondary | ICD-10-CM | POA: Diagnosis not present

## 2019-12-04 DIAGNOSIS — Z79899 Other long term (current) drug therapy: Secondary | ICD-10-CM

## 2019-12-04 DIAGNOSIS — F17210 Nicotine dependence, cigarettes, uncomplicated: Secondary | ICD-10-CM | POA: Diagnosis present

## 2019-12-04 DIAGNOSIS — J9601 Acute respiratory failure with hypoxia: Secondary | ICD-10-CM | POA: Diagnosis not present

## 2019-12-04 DIAGNOSIS — Z9079 Acquired absence of other genital organ(s): Secondary | ICD-10-CM

## 2019-12-04 DIAGNOSIS — F17200 Nicotine dependence, unspecified, uncomplicated: Secondary | ICD-10-CM | POA: Diagnosis not present

## 2019-12-04 DIAGNOSIS — F1010 Alcohol abuse, uncomplicated: Secondary | ICD-10-CM | POA: Diagnosis not present

## 2019-12-04 DIAGNOSIS — F329 Major depressive disorder, single episode, unspecified: Secondary | ICD-10-CM | POA: Diagnosis present

## 2019-12-04 DIAGNOSIS — I739 Peripheral vascular disease, unspecified: Secondary | ICD-10-CM | POA: Diagnosis not present

## 2019-12-04 DIAGNOSIS — G928 Other toxic encephalopathy: Secondary | ICD-10-CM

## 2019-12-04 DIAGNOSIS — J440 Chronic obstructive pulmonary disease with acute lower respiratory infection: Secondary | ICD-10-CM | POA: Diagnosis not present

## 2019-12-04 DIAGNOSIS — Z825 Family history of asthma and other chronic lower respiratory diseases: Secondary | ICD-10-CM | POA: Diagnosis not present

## 2019-12-04 HISTORY — DX: Pneumonia, unspecified organism: J18.9

## 2019-12-04 LAB — BASIC METABOLIC PANEL
Anion gap: 12 (ref 5–15)
BUN: 8 mg/dL (ref 8–23)
CO2: 26 mmol/L (ref 22–32)
Calcium: 9.1 mg/dL (ref 8.9–10.3)
Chloride: 94 mmol/L — ABNORMAL LOW (ref 98–111)
Creatinine, Ser: 0.86 mg/dL (ref 0.61–1.24)
GFR calc Af Amer: >60 mL/min (ref >60)
GFR calc non Af Amer: >60 mL/min (ref >60)
Glucose, Bld: 131 mg/dL — ABNORMAL HIGH (ref 70–99)
Potassium: 4.4 mmol/L (ref 3.5–5.1)
Sodium: 132 mmol/L — ABNORMAL LOW (ref 135–145)

## 2019-12-04 LAB — SARS CORONAVIRUS 2 BY RT PCR (HOSPITAL ORDER, PERFORMED IN ~~LOC~~ HOSPITAL LAB): SARS Coronavirus 2: NEGATIVE

## 2019-12-04 LAB — CBC
HCT: 44.7 % (ref 39.0–52.0)
Hemoglobin: 15.4 g/dL (ref 13.0–17.0)
MCH: 32.2 pg (ref 26.0–34.0)
MCHC: 34.5 g/dL (ref 30.0–36.0)
MCV: 93.5 fL (ref 80.0–100.0)
Platelets: 182 10*3/uL (ref 150–400)
RBC: 4.78 MIL/uL (ref 4.22–5.81)
RDW: 12.8 % (ref 11.5–15.5)
WBC: 9.5 10*3/uL (ref 4.0–10.5)
nRBC: 0 % (ref 0.0–0.2)

## 2019-12-04 LAB — TROPONIN I (HIGH SENSITIVITY)
Troponin I (High Sensitivity): 4 ng/L (ref <18)
Troponin I (High Sensitivity): 4 ng/L (ref <18)

## 2019-12-04 LAB — PROCALCITONIN: Procalcitonin: <0.1 ng/mL

## 2019-12-04 MED ORDER — METHYLPREDNISOLONE SODIUM SUCC 40 MG IJ SOLR
40.0000 mg | Freq: Two times a day (BID) | INTRAMUSCULAR | Status: DC
  Administered 2019-12-04 – 2019-12-09 (×10): 40 mg via INTRAVENOUS
  Filled 2019-12-04 (×10): qty 1

## 2019-12-04 MED ORDER — SODIUM CHLORIDE 0.9 % IV SOLN
500.0000 mg | INTRAVENOUS | Status: DC
  Administered 2019-12-04 – 2019-12-07 (×4): 500 mg via INTRAVENOUS
  Filled 2019-12-04 (×5): qty 500

## 2019-12-04 MED ORDER — LORAZEPAM 1 MG PO TABS: 1.0000 mg | ORAL_TABLET | ORAL | Status: AC | PRN

## 2019-12-04 MED ORDER — ALBUTEROL SULFATE (2.5 MG/3ML) 0.083% IN NEBU
2.5000 mg | INHALATION_SOLUTION | RESPIRATORY_TRACT | Status: DC | PRN
  Administered 2019-12-11 – 2019-12-12 (×4): 2.5 mg via RESPIRATORY_TRACT
  Filled 2019-12-04 (×4): qty 3

## 2019-12-04 MED ORDER — HYDRALAZINE HCL 20 MG/ML IJ SOLN: 5.0000 mg | INTRAMUSCULAR | Status: DC | PRN

## 2019-12-04 MED ORDER — IPRATROPIUM-ALBUTEROL 0.5-2.5 (3) MG/3ML IN SOLN
3.0000 mL | Freq: Once | RESPIRATORY_TRACT | Status: AC
  Administered 2019-12-04: 3 mL via RESPIRATORY_TRACT
  Filled 2019-12-04: qty 3

## 2019-12-04 MED ORDER — ACETAMINOPHEN 325 MG PO TABS
650.0000 mg | ORAL_TABLET | Freq: Four times a day (QID) | ORAL | Status: DC | PRN
  Administered 2019-12-05 – 2019-12-07 (×4): 650 mg via ORAL
  Filled 2019-12-04 (×4): qty 2

## 2019-12-04 MED ORDER — IPRATROPIUM-ALBUTEROL 0.5-2.5 (3) MG/3ML IN SOLN
3.0000 mL | RESPIRATORY_TRACT | Status: DC
  Administered 2019-12-04 – 2019-12-05 (×5): 3 mL via RESPIRATORY_TRACT
  Filled 2019-12-04 (×5): qty 3

## 2019-12-04 MED ORDER — LORAZEPAM 2 MG/ML IJ SOLN
0.0000 mg | Freq: Two times a day (BID) | INTRAMUSCULAR | Status: AC
  Administered 2019-12-06 – 2019-12-07 (×2): 2 mg via INTRAVENOUS
  Filled 2019-12-04 (×3): qty 1

## 2019-12-04 MED ORDER — KETOROLAC TROMETHAMINE 30 MG/ML IJ SOLN
15.0000 mg | Freq: Once | INTRAMUSCULAR | Status: AC
  Administered 2019-12-04: 15 mg via INTRAVENOUS
  Filled 2019-12-04: qty 1

## 2019-12-04 MED ORDER — GABAPENTIN 100 MG PO CAPS
100.0000 mg | ORAL_CAPSULE | Freq: Every day | ORAL | Status: DC
  Administered 2019-12-04 – 2019-12-11 (×7): 100 mg via ORAL
  Filled 2019-12-04 (×9): qty 1

## 2019-12-04 MED ORDER — NICOTINE 21 MG/24HR TD PT24
21.0000 mg | MEDICATED_PATCH | Freq: Every day | TRANSDERMAL | Status: DC
  Administered 2019-12-04 – 2019-12-08 (×5): 21 mg via TRANSDERMAL
  Filled 2019-12-04 (×5): qty 1

## 2019-12-04 MED ORDER — LORAZEPAM 2 MG/ML IJ SOLN
0.0000 mg | Freq: Four times a day (QID) | INTRAMUSCULAR | Status: AC
  Administered 2019-12-04: 2 mg via INTRAVENOUS
  Administered 2019-12-04: 1 mg via INTRAVENOUS
  Administered 2019-12-05 (×2): 2 mg via INTRAVENOUS
  Filled 2019-12-04 (×4): qty 1

## 2019-12-04 MED ORDER — TRIAMCINOLONE ACETONIDE 0.5 % EX OINT
1.0000 "application " | TOPICAL_OINTMENT | Freq: Two times a day (BID) | CUTANEOUS | Status: DC
  Administered 2019-12-04 – 2019-12-12 (×16): 1 via TOPICAL
  Filled 2019-12-04: qty 15

## 2019-12-04 MED ORDER — LORAZEPAM 2 MG/ML IJ SOLN
1.0000 mg | INTRAMUSCULAR | Status: AC | PRN
  Administered 2019-12-06: 1 mg via INTRAVENOUS
  Administered 2019-12-07: 2 mg via INTRAVENOUS
  Filled 2019-12-04 (×2): qty 1

## 2019-12-04 MED ORDER — METHYLPREDNISOLONE SODIUM SUCC 125 MG IJ SOLR
125.0000 mg | Freq: Once | INTRAMUSCULAR | Status: AC
  Administered 2019-12-04: 125 mg via INTRAVENOUS
  Filled 2019-12-04: qty 2

## 2019-12-04 MED ORDER — ENOXAPARIN SODIUM 40 MG/0.4ML ~~LOC~~ SOLN
40.0000 mg | SUBCUTANEOUS | Status: DC
  Administered 2019-12-04 – 2019-12-09 (×6): 40 mg via SUBCUTANEOUS
  Filled 2019-12-04 (×6): qty 0.4

## 2019-12-04 MED ORDER — ONDANSETRON HCL 4 MG/2ML IJ SOLN
4.0000 mg | Freq: Three times a day (TID) | INTRAMUSCULAR | Status: DC | PRN
  Administered 2019-12-05 – 2019-12-08 (×4): 4 mg via INTRAVENOUS
  Filled 2019-12-04 (×4): qty 2

## 2019-12-04 MED ORDER — ASPIRIN EC 81 MG PO TBEC
81.0000 mg | DELAYED_RELEASE_TABLET | ORAL | Status: DC
  Administered 2019-12-04 – 2019-12-12 (×5): 81 mg via ORAL
  Filled 2019-12-04 (×7): qty 1

## 2019-12-04 MED ORDER — ADULT MULTIVITAMIN W/MINERALS CH
1.0000 | ORAL_TABLET | Freq: Every day | ORAL | Status: DC
  Administered 2019-12-04 – 2019-12-12 (×8): 1 via ORAL
  Filled 2019-12-04 (×9): qty 1

## 2019-12-04 MED ORDER — THIAMINE HCL 100 MG/ML IJ SOLN: 100.0000 mg | Freq: Every day | INTRAMUSCULAR | Status: DC

## 2019-12-04 MED ORDER — THIAMINE HCL 100 MG PO TABS
100.0000 mg | ORAL_TABLET | Freq: Every day | ORAL | Status: DC
  Administered 2019-12-04 – 2019-12-12 (×9): 100 mg via ORAL
  Filled 2019-12-04 (×9): qty 1

## 2019-12-04 MED ORDER — DM-GUAIFENESIN ER 30-600 MG PO TB12
1.0000 | ORAL_TABLET | Freq: Two times a day (BID) | ORAL | Status: DC | PRN
  Administered 2019-12-11: 1 via ORAL
  Filled 2019-12-04: qty 1

## 2019-12-04 MED ORDER — SODIUM CHLORIDE 0.9 % IV SOLN
1.0000 g | INTRAVENOUS | Status: DC
  Administered 2019-12-04 – 2019-12-07 (×4): 1 g via INTRAVENOUS
  Filled 2019-12-04 (×2): qty 1
  Filled 2019-12-04 (×3): qty 10

## 2019-12-04 MED ORDER — LISINOPRIL 10 MG PO TABS
10.0000 mg | ORAL_TABLET | Freq: Every day | ORAL | Status: DC
  Administered 2019-12-04 – 2019-12-06 (×3): 10 mg via ORAL
  Filled 2019-12-04 (×3): qty 1

## 2019-12-04 MED ORDER — METHYLPREDNISOLONE SODIUM SUCC 40 MG IJ SOLR: 40.0000 mg | Freq: Two times a day (BID) | INTRAMUSCULAR | Status: DC

## 2019-12-04 MED ORDER — FOLIC ACID 1 MG PO TABS
1.0000 mg | ORAL_TABLET | Freq: Every day | ORAL | Status: DC
  Administered 2019-12-04 – 2019-12-12 (×8): 1 mg via ORAL
  Filled 2019-12-04 (×9): qty 1

## 2019-12-04 NOTE — ED Notes (Signed)
Pt given dinner meal tray

## 2019-12-04 NOTE — ED Notes (Signed)
Pt c/o pain all over. Pt states he has pain all of the time but it hurts all over right now and would like pain medication.

## 2019-12-04 NOTE — ED Provider Notes (Signed)
[facility] Emergency Department Provider Note  ____________________________________________   First MD Initiated Contact with Patient 12/04/19 [phone]     (approximate)  I have reviewed the triage vital signs and the nursing notes.   HISTORY  Chief Complaint Shortness of Breath    HPI Brady Smith is a 74 y.o. male with past medical history of COPD, peripheral vascular disease, depression, here with shortness of breath and cough.  The patient states that over the last several days, he has had progressively worsening cough, wheezing, and shortness of breath.  He states that he lives in a home with multiple other people who have had Covid.  Has been trying to sleep outside in a car to avoid exposure.  He is not vaccinated.  He denies any known fevers, but has had some chills.  He states he has had progressively worsening shortness of breath and has actually been unable to walk up the stairs to use his nebulizer.  He states his symptoms worsened over the last 24 hours so he presents for evaluation.  He states he is having intermittent spasm-like pains diffusely, but no overt chest pain.  No other acute complaints.  He does not wear oxygen at baseline.        Past Medical History:  Diagnosis Date   Anxiety    Bladder cancer ([place])    COPD (chronic obstructive pulmonary disease) (HCC)    Depression    GERD (gastroesophageal reflux disease)    History of kidney cancer    Peripheral vascular disease ([place])    Pneumonia     Patient Active Problem List   Diagnosis Date Noted   Dermatitis 11/24/2019   Senile ecchymosis 08/02/2019   B12 nutritional deficiency 06/07/2019   Memory changes 04/24/2018   Excoriation of scalp 04/24/2018   Paroxysmal atrial fibrillation ([place]) 06/13/2017   Elevated blood sugar 02/03/2017   S/P amputation of lesser toe ([place]) 07/25/2015   Peripheral vascular disease ([place]) 07/14/2015   AA (alcohol abuse) 05/23/2015    Urinary retention 05/18/2015   Essential hypertension 05/18/2015   Choroidal nevus, right eye 02/08/2015   Carpal tunnel syndrome 01/13/2015   H/O gastrointestinal disease 01/13/2015   History of primary malignant neoplasm of urinary bladder 01/13/2015   Compulsive tobacco user syndrome 01/13/2015   HLD (hyperlipidemia) 01/13/2015   Neurosis, phobic 01/13/2015   Chronic obstructive pulmonary disease ([place]) 06/11/2011   Lung nodule, solitary 06/11/2011    Past Surgical History:  Procedure Laterality Date   AMPUTATION Right 07/19/2015   Procedure: AMPUTATION DIGIT ( RIGHT FOOT FIFTH TOE );  Surgeon: Algernon Huxley, MD;  Location: [facility];  Service: Vascular;  Laterality: Right;   BOWEL RESECTION  05/20/2015   Procedure: SMALL BOWEL RESECTION;  Surgeon: Clayburn Pert, MD;  Location: [facility];  Service: General;;   CATARACT EXTRACTION W/PHACO Left 10/06/2018   Procedure: CATARACT EXTRACTION PHACO AND INTRAOCULAR LENS PLACEMENT ([place])  LEFT;  Surgeon: Birder Robson, MD;  Location: [place];  Service: Ophthalmology;  Laterality: Left;  DAY OF TEST. LIVES IN BOARDING HOUSE   CATARACT EXTRACTION W/PHACO Right 10/27/2018   Procedure: CATARACT EXTRACTION PHACO AND INTRAOCULAR LENS PLACEMENT ([place])  RIGHT;  Surgeon: Birder Robson, MD;  Location: [place];  Service: Ophthalmology;  Laterality: Right;  PT HAS TO HAVE COVID TEST MORNING OF LEAVE AT LAST PATIENT   CHOLECYSTECTOMY     COLONOSCOPY  2000   CYSTECTOMY W/ CONTINENT DIVERSION  1996   HERNIA REPAIR  LAPAROTOMY N/A 05/20/2015   Procedure: EXPLORATORY LAPAROTOMY;  Surgeon: Clayburn Pert, MD;  Location: [facility];  Service: General;  Laterality: N/A;   PERIPHERAL VASCULAR CATHETERIZATION N/A 07/03/2015   Procedure: Abdominal Aortogram w/Lower Extremity;  Surgeon: Algernon Huxley, MD;  Location: [place] [facility];  Service: Cardiovascular;  Laterality: N/A;   PERIPHERAL VASCULAR CATHETERIZATION   07/03/2015   Procedure: Lower Extremity Intervention;  Surgeon: Algernon Huxley, MD;  Location: [place] [facility];  Service: Cardiovascular;;   PROSTATECTOMY  1996   TONSILLECTOMY      Prior to Admission medications   Medication Sig Start Date End Date Taking? Authorizing Provider  albuterol (VENTOLIN HFA) 108 (90 Base) MCG/ACT inhaler Inhale 2 puffs into the lungs every 6 (six) hours as needed for wheezing or shortness of breath. 08/02/19   Glean Hess, MD  aspirin EC 81 MG tablet Take 81 mg by mouth every other day.    [provider]  Cyanocobalamin (VITAMIN B-12 IJ) Inject as directed every 30 (thirty) days.     [provider]  gabapentin (NEURONTIN) 100 MG capsule TAKE 1 CAPSULE(100 MG) BY MOUTH AT BEDTIME 11/24/19   Glean Hess, MD  ipratropium-albuterol (DUONEB) 0.5-2.5 (3) MG/3ML SOLN Take 3 mLs by nebulization 3 (three) times daily. 11/24/19   Glean Hess, MD  lisinopril (ZESTRIL) 10 MG tablet TAKE 1 TABLET(10 MG) BY MOUTH DAILY 10/15/19   Glean Hess, MD  triamcinolone ointment (KENALOG) 0.5 % Apply 1 application topically 2 (two) times daily. To rash on leg 11/24/19   Glean Hess, MD    Allergies Influenza vaccines and Lipitor [atorvastatin calcium]  Family History  Problem Relation Age of Onset   Heart disease Mother    Heart disease Father    COPD Sister     Social History Social History   Tobacco Use   Smoking status: Current Every Day Smoker    Packs/day: 0.25    Years: 65.00    Pack years: 16.25    Types: Cigarettes   Smokeless tobacco: Never Used   Tobacco comment: reduced # of packs smoked to 5-6 ciggs daily from 3 pks daily   Vaping Use   Vaping Use: Never used  Substance Use Topics   Alcohol use: Yes    Alcohol/week: 12.0 standard drinks    Types: 12 Cans of beer per week   Drug use: No    Review of Systems  Review of Systems  Constitutional: Positive for fatigue. Negative for chills and fever.    HENT: Negative for sore throat.   Respiratory: Positive for cough, shortness of breath and wheezing.   Cardiovascular: Negative for chest pain.  Gastrointestinal: Negative for abdominal pain.  Genitourinary: Negative for flank pain.  Musculoskeletal: Positive for arthralgias. Negative for neck pain.  Skin: Negative for rash and wound.  Allergic/Immunologic: Negative for immunocompromised state.  Neurological: Positive for weakness. Negative for numbness.  Hematological: Does not bruise/bleed easily.  All other systems reviewed and are negative.    ____________________________________________  PHYSICAL EXAM:      VITAL SIGNS: ED Triage Vitals  Enc Vitals Group     BP 12/04/19 0620 (!) 158/101     Pulse Rate 12/04/19 0620 79     Resp 12/04/19 0620 (!) 22     Temp 12/04/19 0636 97.6 F (36.4 C)     Temp Source 12/04/19 0620 Oral     SpO2 12/04/19 0620 97 %     Weight 12/04/19 1025  150 lb (68 kg)     Height 12/04/19 0621 5\' 7"  (1.702 m)     Head Circumference --      Peak Flow --      Pain Score 12/04/19 0620 5     Pain Loc --      Pain Edu? --      Excl. in [place]? --      Physical Exam Vitals and nursing note reviewed.  Constitutional:      General: He is not in acute distress.    Appearance: He is well-developed.  HENT:     Head: Normocephalic and atraumatic.  Eyes:     Conjunctiva/sclera: Conjunctivae normal.  Cardiovascular:     Rate and Rhythm: Normal rate and regular rhythm.     Heart sounds: Normal heart sounds. No murmur heard.  No friction rub.  Pulmonary:     Effort: Pulmonary effort is normal. Tachypnea present. No respiratory distress.     Breath sounds: Examination of the right-upper field reveals wheezing. Examination of the left-upper field reveals wheezing. Examination of the right-middle field reveals wheezing. Examination of the left-middle field reveals wheezing. Examination of the right-lower field reveals wheezing. Examination of the left-lower  field reveals wheezing. Wheezing present. No rales.  Abdominal:     General: There is no distension.     Palpations: Abdomen is soft.     Tenderness: There is no abdominal tenderness.  Musculoskeletal:     Cervical back: Neck supple.  Skin:    General: Skin is warm.     Capillary Refill: Capillary refill takes less than 2 seconds.  Neurological:     Mental Status: He is alert and oriented to person, place, and time.     Motor: No abnormal muscle tone.       ____________________________________________   LABS (all labs ordered are listed, but only abnormal results are displayed)  Labs Reviewed  BASIC METABOLIC PANEL - Abnormal; Notable for the following components:      Result Value   Sodium 132 (*)    Chloride 94 (*)    Glucose, Bld 131 (*)    All other components within normal limits  SARS CORONAVIRUS 2 BY RT PCR (HOSPITAL ORDER, [place] [facility])  CBC  PROCALCITONIN  TROPONIN I (HIGH SENSITIVITY)    ____________________________________________  EKG: Normal sinus rhythm, ventricular rate 81.  PR 165, QRS 1 6, QTc 439.  No acute ST elevations or depressions. ________________________________________  RADIOLOGY All imaging, including plain films, CT scans, and ultrasounds, independently reviewed by me, and interpretations confirmed via formal radiology reads.  ED MD interpretation:   CXR: Pending  Official radiology report(s): No results found.  ____________________________________________  PROCEDURES   Procedure(s) performed (including Critical Care):  Procedures  ____________________________________________  INITIAL IMPRESSION / MDM / [place] / ED COURSE  As part of my medical decision making, I reviewed the following data within the [place] notes reviewed and incorporated, Old chart reviewed, Notes from prior ED visits, and [place] [employer] Sub[place] was  evaluated in Emergency Department on 12/04/2019 for the symptoms described in the history of present illness. He was evaluated in the context of the global COVID-19 pandemic, which necessitated consideration that the patient might be at risk for infection with the SARS-CoV-2 virus that causes COVID-19. Institutional protocols and algorithms that pertain to the evaluation of patients at risk for COVID-19 are in a  state of rapid change based on information released by regulatory bodies including the CDC and federal and state organizations. These policies and algorithms were followed during the patient's care in the ED.  Some ED evaluations and interventions may be delayed as a result of limited staffing during the pandemic.*     Medical Decision Making: 74 year old male here with acute hypoxic respiratory failure, likely due to COPD with possible component of pneumonia versus Covid.  He is on 2 L on arrival.  Will give steroids, duo nebs, and plan for likely admission pending lab work.  EKG is nonischemic and he has no chest pain.  No clinical evidence to suggest PE.  ____________________________________________  FINAL CLINICAL IMPRESSION(S) / ED DIAGNOSES  Final diagnoses:  COPD exacerbation (HCC)  Shortness of breath     MEDICATIONS GIVEN DURING THIS VISIT:  Medications  methylPREDNISolone sodium succinate (SOLU-MEDROL) 125 mg/2 mL injection 125 mg (has no administration in time range)  ipratropium-albuterol (DUONEB) 0.5-2.5 (3) MG/3ML nebulizer solution 3 mL (has no administration in time range)  ipratropium-albuterol (DUONEB) 0.5-2.5 (3) MG/3ML nebulizer solution 3 mL (has no administration in time range)  ipratropium-albuterol (DUONEB) 0.5-2.5 (3) MG/3ML nebulizer solution 3 mL (has no administration in time range)     ED Discharge Orders    None       Note:  This document was prepared using Dragon voice recognition software and may include unintentional dictation errors.     Duffy Bruce, MD 12/04/19 [phone]

## 2019-12-04 NOTE — ED Notes (Signed)
Report to ashley, rn

## 2019-12-04 NOTE — ED Provider Notes (Signed)
-----------------------------------------   10:49 AM on 12/04/2019 -----------------------------------------  Covid test is negative.  X-ray shows a right lower lobe infiltrate.  I discussed the case with the hospitalist Dr. Blaine Hamper for admission.  He will order antibiotics for the pneumonia.   Arta Silence, MD 12/04/19 1049

## 2019-12-04 NOTE — H&P (Signed)
History and Physical    Brady Smith ZRA:[id number] DOB: 01-07-1946 DOA: 12/04/2019  Referring MD/NP/PA:   PCP: Glean Hess, MD   Patient coming from:  The patient is coming from home.  At baseline, pt is independent for most of ADL.        Chief Complaint: cough and SOB  HPI: Brady Smith is a 73 y.o. male with medical history significant of COPD, hypertension, hyperlipidemia, GERD, alcohol abuse, tobacco abuse, PAF not on anticoagulants, GERD, depression, anxiety, bladder cancer, PVD, who presents with cough and shortness of breath.  Patient states that his symptoms have been going on for several days, which has been progressively worsening, including cough, wheezing or shortness of breath. Denies fever or chills. He states he is having intermittent spasm-like pains diffusely, but no overt chest pain. He states that he lives in a home with multiple other people who have had Covid. Has been trying to sleep outside in a car to avoid exposure. He received 2 dose of Covid vaccine.  Patient does not have nausea vomiting, diarrhea, abdominal pain, symptoms of UTI or unilateral weakness.    ED Course: pt was found to have WBC 9.5, negative Covid PCR, troponin level 4 -->4, renal function okay, temperature normal, blood pressure 156/77, heart rate 83, RR 22, oxygen saturation 94% on 2 L oxygen (patient is not wearing oxygen at home), chest x-ray showed patchy infiltration in right lower lobe. Patient is placed on MedSurg bed for observation.   Review of Systems:   General: no fevers, chills, no body weight gain, has fatigue HEENT: no blurry vision, hearing changes or sore throat Respiratory: has dyspnea, coughing, wheezing CV: no chest pain, no palpitations GI: no nausea, vomiting, abdominal pain, diarrhea, constipation. Has abdominal hernia GU: no dysuria, burning on urination, increased urinary frequency, hematuria  Ext: no leg edema Neuro: no unilateral weakness, numbness, or  tingling, no vision change or hearing loss Skin: no rash, no skin tear. MSK: No muscle spasm, no deformity, no limitation of range of movement in spin Heme: No easy bruising.  Travel history: No recent long distant travel.  Allergy:  Allergies  Allergen Reactions  . Influenza Vaccines     High blood pressure- had to be admitted  . Lipitor [Atorvastatin Calcium]     Muscle pain    Past Medical History:  Diagnosis Date  . Anxiety   . Bladder cancer ([place])   . COPD (chronic obstructive pulmonary disease) ([place])   . Depression   . GERD (gastroesophageal reflux disease)   . History of kidney cancer   . Peripheral vascular disease ([place])   . Pneumonia     Past Surgical History:  Procedure Laterality Date  . AMPUTATION Right 07/19/2015   Procedure: AMPUTATION DIGIT ( RIGHT FOOT FIFTH TOE );  Surgeon: Algernon Huxley, MD;  Location: [facility];  Service: Vascular;  Laterality: Right;  . BOWEL RESECTION  05/20/2015   Procedure: SMALL BOWEL RESECTION;  Surgeon: Clayburn Pert, MD;  Location: [facility];  Service: General;;  . CATARACT EXTRACTION W/PHACO Left 10/06/2018   Procedure: CATARACT EXTRACTION PHACO AND INTRAOCULAR LENS PLACEMENT ([place])  LEFT;  Surgeon: Birder Robson, MD;  Location: [place];  Service: Ophthalmology;  Laterality: Left;  DAY OF TEST. LIVES IN BOARDING HOUSE  . CATARACT EXTRACTION W/PHACO Right 10/27/2018   Procedure: CATARACT EXTRACTION PHACO AND INTRAOCULAR LENS PLACEMENT ([place])  RIGHT;  Surgeon: Birder Robson, MD;  Location: [place];  Service: Ophthalmology;  Laterality:  Right;  PT HAS TO HAVE COVID TEST MORNING OF LEAVE AT LAST PATIENT  . CHOLECYSTECTOMY    . COLONOSCOPY  2000  . CYSTECTOMY W/ CONTINENT DIVERSION  1996  . HERNIA REPAIR    . LAPAROTOMY N/A 05/20/2015   Procedure: EXPLORATORY LAPAROTOMY;  Surgeon: Clayburn Pert, MD;  Location: [facility];  Service: General;  Laterality: N/A;  . PERIPHERAL VASCULAR CATHETERIZATION N/A 07/03/2015    Procedure: Abdominal Aortogram w/Lower Extremity;  Surgeon: Algernon Huxley, MD;  Location: [facility];  Service: Cardiovascular;  Laterality: N/A;  . PERIPHERAL VASCULAR CATHETERIZATION  07/03/2015   Procedure: Lower Extremity Intervention;  Surgeon: Algernon Huxley, MD;  Location: [facility];  Service: Cardiovascular;;  . PROSTATECTOMY  1996  . TONSILLECTOMY      Social History:  reports that he has been smoking cigarettes. He has a 16.25 pack-year smoking history. He has never used smokeless tobacco. He reports current alcohol use of about 12.0 standard drinks of alcohol per week. He reports that he does not use drugs.  Family History:  Family History  Problem Relation Age of Onset  . Heart disease Mother   . Heart disease Father   . COPD Sister      Prior to Admission medications   Medication Sig Start Date End Date Taking? Authorizing Provider  albuterol (VENTOLIN HFA) 108 (90 Base) MCG/ACT inhaler Inhale 2 puffs into the lungs every 6 (six) hours as needed for wheezing or shortness of breath. 08/02/19   Glean Hess, MD  aspirin EC 81 MG tablet Take 81 mg by mouth every other day.    [provider]  Cyanocobalamin (VITAMIN B-12 IJ) Inject as directed every 30 (thirty) days.     [provider]  gabapentin (NEURONTIN) 100 MG capsule TAKE 1 CAPSULE(100 MG) BY MOUTH AT BEDTIME 11/24/19   Glean Hess, MD  ipratropium-albuterol (DUONEB) 0.5-2.5 (3) MG/3ML SOLN Take 3 mLs by nebulization 3 (three) times daily. 11/24/19   Glean Hess, MD  lisinopril (ZESTRIL) 10 MG tablet TAKE 1 TABLET(10 MG) BY MOUTH DAILY 10/15/19   Glean Hess, MD  triamcinolone ointment (KENALOG) 0.5 % Apply 1 application topically 2 (two) times daily. To rash on leg 11/24/19   Glean Hess, MD    Physical Exam: Vitals:   12/04/19 0630 12/04/19 0636 12/04/19 0645 12/04/19 0854  BP: 139/83   (!) 156/77  Pulse: 80  84 83  Resp: (!) 21  17 (!) 21  Temp:  97.6 F  (36.4 C)    TempSrc:  Axillary    SpO2: 100%  99% 94%  Weight:      Height:       General: Not in acute distress HEENT:       Eyes: PERRL, EOMI, no scleral icterus.       ENT: No discharge from the ears and nose, no pharynx injection, no tonsillar enlargement.        Neck: No JVD, no bruit, no mass felt. Heme: No neck lymph node enlargement. Cardiac: S1/S2, RRR, No murmurs, No gallops or rubs. Respiratory: Has wheezing bilaterally GI: Soft, has a large ventral hernia, nontender, no rebound pain, no organomegaly, BS present. GU: No hematuria Ext: No pitting leg edema bilaterally. 2+DP/PT pulse bilaterally. Musculoskeletal: No joint deformities, No joint redness or warmth, no limitation of ROM in spin. Skin: No rashes.  Neuro: Alert, oriented X3, cranial nerves II-XII grossly intact, moves all extremities normally. Psych: Patient is not  psychotic, no suicidal or hemocidal  ideation.  Labs on Admission: I have personally reviewed following labs and imaging studies  CBC: Recent Labs  Lab 12/04/19 0625  WBC 9.5  HGB 15.4  HCT 44.7  MCV 93.5  PLT 269   Basic Metabolic Panel: Recent Labs  Lab 12/04/19 0625  NA 132*  K 4.4  CL 94*  CO2 26  GLUCOSE 131*  BUN 8  CREATININE 0.86  CALCIUM 9.1   GFR: Estimated Creatinine Clearance: 70.5 mL/min (by C-G formula based on SCr of 0.86 mg/dL). Liver Function Tests: No results for input(s): AST, ALT, ALKPHOS, BILITOT, PROT, ALBUMIN in the last 168 hours. No results for input(s): LIPASE, AMYLASE in the last 168 hours. No results for input(s): AMMONIA in the last 168 hours. Coagulation Profile: No results for input(s): INR, PROTIME in the last 168 hours. Cardiac Enzymes: No results for input(s): CKTOTAL, CKMB, CKMBINDEX, TROPONINI in the last 168 hours. BNP (last 3 results) No results for input(s): PROBNP in the last 8760 hours. HbA1C: No results for input(s): HGBA1C in the last 72 hours. CBG: No results for input(s): GLUCAP  in the last 168 hours. Lipid Profile: No results for input(s): CHOL, HDL, LDLCALC, TRIG, CHOLHDL, LDLDIRECT in the last 72 hours. Thyroid Function Tests: No results for input(s): TSH, T4TOTAL, FREET4, T3FREE, THYROIDAB in the last 72 hours. Anemia Panel: No results for input(s): VITAMINB12, FOLATE, FERRITIN, TIBC, IRON, RETICCTPCT in the last 72 hours. Urine analysis:    Component Value Date/Time   COLORURINE YELLOW (A) 05/22/2015 0925   APPEARANCEUR CLEAR (A) 05/22/2015 0925   APPEARANCEUR Hazy 07/27/2013 1041   LABSPEC 1.015 05/22/2015 0925   LABSPEC 1.010 07/27/2013 1041   PHURINE 6.0 05/22/2015 0925   GLUCOSEU NEGATIVE 05/22/2015 0925   GLUCOSEU Negative 07/27/2013 1041   HGBUR 1+ (A) 05/22/2015 0925   BILIRUBINUR NEGATIVE 05/22/2015 0925   BILIRUBINUR Negative 07/27/2013 1041   KETONESUR TRACE (A) 05/22/2015 0925   PROTEINUR NEGATIVE 05/22/2015 0925   NITRITE NEGATIVE 05/22/2015 0925   LEUKOCYTESUR NEGATIVE 05/22/2015 0925   LEUKOCYTESUR Negative 07/27/2013 1041   Sepsis Labs: @LABRCNTIP (procalcitonin:4,lacticidven:4) ) Recent Results (from the past 240 hour(s))  SARS Coronavirus 2 by RT PCR (hospital order, performed in [place] hospital lab) Nasopharyngeal Nasopharyngeal Swab     Status: None   Collection Time: 12/04/19  6:25 AM   Specimen: Nasopharyngeal Swab  Result Value Ref Range Status   SARS Coronavirus 2 NEGATIVE NEGATIVE Final    Comment: (NOTE) SARS-CoV-2 target nucleic acids are NOT DETECTED.  The SARS-CoV-2 RNA is generally detectable in upper and lower respiratory specimens during the acute phase of infection. The lowest concentration of SARS-CoV-2 viral copies this assay can detect is 250 copies / mL. A negative result does not preclude SARS-CoV-2 infection and should not be used as the sole basis for treatment or other patient management decisions.  A negative result may occur with improper specimen collection / handling, submission of specimen  other than nasopharyngeal swab, presence of viral mutation(s) within the areas targeted by this assay, and inadequate number of viral copies (<250 copies / mL). A negative result must be combined with clinical observations, patient history, and epidemiological information.  Fact Sheet for Patients:   StrictlyIdeas.no  Fact Sheet for Healthcare Providers: BankingDealers.co.za  This test is not yet approved or  cleared by the [place] FDA and has been authorized for detection and/or diagnosis of SARS-CoV-2 by FDA under an Emergency Use Authorization (EUA).  This EUA will  remain in effect (meaning this test can be used) for the duration of the COVID-19 declaration under Section 564(b)(1) of the Act, 21 U.S.C. section 360bbb-3(b)(1), unless the authorization is terminated or revoked sooner.  Performed at [facility], [address], [place],  [address]      Radiological Exams on Admission: DG Chest 1 View  Result Date: 12/04/2019 CLINICAL DATA:  Pt with shob,chest pain and covid exposure. Pt with history of copd. Current smoker. shob, maybe covid exposure EXAM: CHEST  1 VIEW COMPARISON:  11/20/2019 FINDINGS: Patient rotated rightward. Normal cardiac silhouette. Potential patchy airspace disease in the RIGHT lower lobe. LEFT lung relatively clear. No pneumothorax. IMPRESSION: Patchy airspace disease in the RIGHT lower lobe could represent early pneumonia. Electronically Signed   By: Suzy Bouchard M.D.   On: 12/04/2019 07:37     EKG:  Not done in ED, will get one.   Assessment/Plan Principal Problem:   CAP (community acquired pneumonia) Active Problems:   COPD exacerbation ([place])   Essential hypertension   AA (alcohol abuse)   Paroxysmal atrial fibrillation ([facility])   Tobacco abuse   CAP (community acquired pneumonia) and COPD exacerbation ([place]): Patient has wheezing on auscultation, indicating COPD  exacerbation. Chest x-ray showed patchy infiltration right lower lobe, consistent with CAP. Patient does not have fever or leukocytosis. Clinically not septic. Patient does not wear oxygen at home, currently oxygen saturation 94% on 2 L oxygen.  - will place to med-surg bed for observation -Bronchodilators -Solu-Medrol 40 mg IV tid -Rocephin and azithromycin -Mucinex for cough  -Incentive spirometry -Urine S. Pneumococcal and Legionella antigen -Follow up blood culture x2, sputum culture -Nasal cannula oxygen as needed to maintain O2 saturation 93% or greater  Essential hypertension -IV hydralazine as needed -Lisinopril  Tobacco abuse and Alcohol abuse: -Nicotine patch -CIWA protocol  Paroxysmal atrial fibrillation ([place]): not on anticoagulants, not sure why patient is not anticoagulated. Heart rate 83 -Cardiac monitoring -on ASA         DVT ppx: SQ Lovenox Code Status: Full code Family Communication: Yes, patient's care giver by phone Disposition Plan:  Anticipate discharge back to previous environment Consults called:  none Admission status: Med-surg bed for obs     Status is: Observation  The patient remains OBS appropriate and will d/c before 2 midnights.  Dispo: The patient is from: Home              Anticipated d/c is to: Home              Anticipated d/c date is: 1 day              Patient currently is not medically stable to d/c.          Date of Service 12/04/2019    Ivor Costa [facility]   If 7PM-7AM, please contact night-coverage [email or web address] 12/04/2019, 11:08 AM

## 2019-12-04 NOTE — ED Notes (Signed)
On arrival to room, pt sitting with legs in between side rails, had removed monitoring and oxygen. New linens and gown placed, non skid socks placed, comfort measure, call bell within reach and doors opened for visability.

## 2019-12-04 NOTE — ED Triage Notes (Signed)
Pt with shob,chest pain and covid exposure. Pt with history of copd. Pt on 2lpm oxygen via .

## 2019-12-04 NOTE — Plan of Care (Signed)
Patient is alert and oriented to everything except situation. Currently cooperative. Will continue to monitor. Brady Smith

## 2019-12-05 DIAGNOSIS — Z89421 Acquired absence of other right toe(s): Secondary | ICD-10-CM | POA: Diagnosis not present

## 2019-12-05 DIAGNOSIS — J9601 Acute respiratory failure with hypoxia: Secondary | ICD-10-CM | POA: Diagnosis present

## 2019-12-05 DIAGNOSIS — Z8551 Personal history of malignant neoplasm of bladder: Secondary | ICD-10-CM | POA: Diagnosis not present

## 2019-12-05 DIAGNOSIS — Z20822 Contact with and (suspected) exposure to covid-19: Secondary | ICD-10-CM | POA: Diagnosis present

## 2019-12-05 DIAGNOSIS — M160 Bilateral primary osteoarthritis of hip: Secondary | ICD-10-CM | POA: Diagnosis not present

## 2019-12-05 DIAGNOSIS — I480 Paroxysmal atrial fibrillation: Secondary | ICD-10-CM | POA: Diagnosis present

## 2019-12-05 DIAGNOSIS — F1010 Alcohol abuse, uncomplicated: Secondary | ICD-10-CM | POA: Diagnosis present

## 2019-12-05 DIAGNOSIS — I708 Atherosclerosis of other arteries: Secondary | ICD-10-CM | POA: Diagnosis not present

## 2019-12-05 DIAGNOSIS — K5939 Other megacolon: Secondary | ICD-10-CM | POA: Diagnosis not present

## 2019-12-05 DIAGNOSIS — J441 Chronic obstructive pulmonary disease with (acute) exacerbation: Secondary | ICD-10-CM | POA: Diagnosis not present

## 2019-12-05 DIAGNOSIS — R05 Cough: Secondary | ICD-10-CM | POA: Diagnosis not present

## 2019-12-05 DIAGNOSIS — Z85528 Personal history of other malignant neoplasm of kidney: Secondary | ICD-10-CM | POA: Diagnosis not present

## 2019-12-05 DIAGNOSIS — K6389 Other specified diseases of intestine: Secondary | ICD-10-CM | POA: Diagnosis not present

## 2019-12-05 DIAGNOSIS — Z8249 Family history of ischemic heart disease and other diseases of the circulatory system: Secondary | ICD-10-CM | POA: Diagnosis not present

## 2019-12-05 DIAGNOSIS — F329 Major depressive disorder, single episode, unspecified: Secondary | ICD-10-CM | POA: Diagnosis present

## 2019-12-05 DIAGNOSIS — K219 Gastro-esophageal reflux disease without esophagitis: Secondary | ICD-10-CM | POA: Diagnosis present

## 2019-12-05 DIAGNOSIS — Z7982 Long term (current) use of aspirin: Secondary | ICD-10-CM | POA: Diagnosis not present

## 2019-12-05 DIAGNOSIS — J440 Chronic obstructive pulmonary disease with acute lower respiratory infection: Secondary | ICD-10-CM | POA: Diagnosis present

## 2019-12-05 DIAGNOSIS — K469 Unspecified abdominal hernia without obstruction or gangrene: Secondary | ICD-10-CM | POA: Diagnosis not present

## 2019-12-05 DIAGNOSIS — Y929 Unspecified place or not applicable: Secondary | ICD-10-CM | POA: Diagnosis not present

## 2019-12-05 DIAGNOSIS — R0602 Shortness of breath: Secondary | ICD-10-CM | POA: Diagnosis not present

## 2019-12-05 DIAGNOSIS — Z825 Family history of asthma and other chronic lower respiratory diseases: Secondary | ICD-10-CM | POA: Diagnosis not present

## 2019-12-05 DIAGNOSIS — I739 Peripheral vascular disease, unspecified: Secondary | ICD-10-CM | POA: Diagnosis present

## 2019-12-05 DIAGNOSIS — T424X5A Adverse effect of benzodiazepines, initial encounter: Secondary | ICD-10-CM | POA: Diagnosis not present

## 2019-12-05 DIAGNOSIS — F17210 Nicotine dependence, cigarettes, uncomplicated: Secondary | ICD-10-CM | POA: Diagnosis present

## 2019-12-05 DIAGNOSIS — K439 Ventral hernia without obstruction or gangrene: Secondary | ICD-10-CM | POA: Diagnosis not present

## 2019-12-05 DIAGNOSIS — I10 Essential (primary) hypertension: Secondary | ICD-10-CM | POA: Diagnosis not present

## 2019-12-05 DIAGNOSIS — K56609 Unspecified intestinal obstruction, unspecified as to partial versus complete obstruction: Secondary | ICD-10-CM | POA: Diagnosis not present

## 2019-12-05 DIAGNOSIS — Z888 Allergy status to other drugs, medicaments and biological substances status: Secondary | ICD-10-CM | POA: Diagnosis not present

## 2019-12-05 DIAGNOSIS — G92 Toxic encephalopathy: Secondary | ICD-10-CM | POA: Diagnosis not present

## 2019-12-05 DIAGNOSIS — E785 Hyperlipidemia, unspecified: Secondary | ICD-10-CM | POA: Diagnosis present

## 2019-12-05 DIAGNOSIS — E875 Hyperkalemia: Secondary | ICD-10-CM | POA: Diagnosis not present

## 2019-12-05 DIAGNOSIS — J189 Pneumonia, unspecified organism: Secondary | ICD-10-CM | POA: Diagnosis present

## 2019-12-05 LAB — EXPECTORATED SPUTUM ASSESSMENT W GRAM STAIN, RFLX TO RESP C

## 2019-12-05 LAB — BLOOD CULTURE ID PANEL (REFLEXED) - BCID2

## 2019-12-05 LAB — CBC
HCT: 39.8 % (ref 39.0–52.0)
Hemoglobin: 13.5 g/dL (ref 13.0–17.0)
MCH: 32.1 pg (ref 26.0–34.0)
MCHC: 33.9 g/dL (ref 30.0–36.0)
MCV: 94.5 fL (ref 80.0–100.0)
Platelets: 204 10*3/uL (ref 150–400)
RBC: 4.21 MIL/uL — ABNORMAL LOW (ref 4.22–5.81)
RDW: 12.7 % (ref 11.5–15.5)
WBC: 15.5 10*3/uL — ABNORMAL HIGH (ref 4.0–10.5)
nRBC: 0 % (ref 0.0–0.2)

## 2019-12-05 LAB — BASIC METABOLIC PANEL
Anion gap: 9 (ref 5–15)
BUN: 23 mg/dL (ref 8–23)
CO2: 24 mmol/L (ref 22–32)
Calcium: 9.2 mg/dL (ref 8.9–10.3)
Chloride: 102 mmol/L (ref 98–111)
Creatinine, Ser: 0.97 mg/dL (ref 0.61–1.24)
GFR calc Af Amer: >60 mL/min (ref >60)
GFR calc non Af Amer: >60 mL/min (ref >60)
Glucose, Bld: 165 mg/dL — ABNORMAL HIGH (ref 70–99)
Potassium: 4.9 mmol/L (ref 3.5–5.1)
Sodium: 135 mmol/L (ref 135–145)

## 2019-12-05 LAB — STREP PNEUMONIAE URINARY ANTIGEN: Strep Pneumo Urinary Antigen: NEGATIVE

## 2019-12-05 LAB — HIV ANTIBODY (ROUTINE TESTING W REFLEX): HIV Screen 4th Generation wRfx: NONREACTIVE

## 2019-12-05 MED ORDER — ALUM & MAG HYDROXIDE-SIMETH 200-200-20 MG/5ML PO SUSP: 30.0000 mL | ORAL | Status: DC | PRN

## 2019-12-05 MED ORDER — MORPHINE SULFATE (PF) 2 MG/ML IV SOLN
1.0000 mg | INTRAVENOUS | Status: DC | PRN
  Administered 2019-12-05 – 2019-12-08 (×6): 1 mg via INTRAVENOUS
  Filled 2019-12-05 (×7): qty 1

## 2019-12-05 MED ORDER — PROMETHAZINE HCL 25 MG/ML IJ SOLN
12.5000 mg | Freq: Four times a day (QID) | INTRAMUSCULAR | Status: DC | PRN
  Administered 2019-12-06 – 2019-12-07 (×2): 12.5 mg via INTRAVENOUS
  Filled 2019-12-05 (×2): qty 1

## 2019-12-05 MED ORDER — IPRATROPIUM-ALBUTEROL 0.5-2.5 (3) MG/3ML IN SOLN
3.0000 mL | Freq: Four times a day (QID) | RESPIRATORY_TRACT | Status: DC
  Administered 2019-12-06 – 2019-12-10 (×15): 3 mL via RESPIRATORY_TRACT
  Filled 2019-12-05 (×15): qty 3

## 2019-12-05 NOTE — Treatment Plan (Addendum)
Pt complaining of severe nausea at this time and has notable sweating at this time. Screaming at staff for morphine and getting agitated as we try to speak with him Regulatory affairs officer and CNA). Pt re directed. BP stable. Tremor noted in bilateral hands. Pt states his head is starting to hurt. CIWA 13, ativan 2mg  given per protocol. MD notified.   Prior to this event, patient has pulled condom cath off several times this shift and has been replaced. Pt has made sexual comments to this writer, respiratory, and CNA. Pt been redirected several times about sexual language used to staff. Pt tried to use bedpain without success. Pt noted to have large hernia to abdomen. Tolerating PO liquids fine and appetite is good. Last BM was 12-03-19. Patient also has bag tied to bed with his home goods in it and money. Would not let staff look in bag.

## 2019-12-05 NOTE — Treatment Plan (Addendum)
Sputum culture re collected and sent to lab at this time.   Update: Lab technician called at 1pm and stated to have morning nurse get new sputum sample and send down since in the morning will have better sample.

## 2019-12-05 NOTE — Progress Notes (Signed)
PHARMACY - PHYSICIAN COMMUNICATION CRITICAL VALUE ALERT - BLOOD CULTURE IDENTIFICATION (BCID)  Brady Smith is an 74 y.o. male who presented to [facility] on 12/04/2019 with a chief complaint of SOB  Assessment:  WNL, 2L 100% [place], CXR IMPRESSION: Patchy airspace disease in the RIGHT lower lobe could represent early pneumonia. 1/4 GPC Staph species  Name of physician (or Provider) Contacted: Sharion Settler  Current antibiotics: azithromycin/ceftriaxone  Changes to prescribed antibiotics recommended:  Patient is on recommended antibiotics - No changes needed at this time considering cx possibly a contaminate.  Results for orders placed or performed during the hospital encounter of 12/04/19  Blood Culture ID Panel (Reflexed) (Collected: 12/04/2019 12:35 PM)  Result Value Ref Range   Enterococcus faecalis NOT DETECTED NOT DETECTED   Enterococcus Faecium NOT DETECTED NOT DETECTED   Listeria monocytogenes NOT DETECTED NOT DETECTED   Staphylococcus species DETECTED (A) NOT DETECTED   Staphylococcus aureus (BCID) NOT DETECTED NOT DETECTED   Staphylococcus epidermidis NOT DETECTED NOT DETECTED   Staphylococcus lugdunensis NOT DETECTED NOT DETECTED   Streptococcus species NOT DETECTED NOT DETECTED   Streptococcus agalactiae NOT DETECTED NOT DETECTED   Streptococcus pneumoniae NOT DETECTED NOT DETECTED   Streptococcus pyogenes NOT DETECTED NOT DETECTED   A.calcoaceticus-baumannii NOT DETECTED NOT DETECTED   Bacteroides fragilis NOT DETECTED NOT DETECTED   Enterobacterales NOT DETECTED NOT DETECTED   Enterobacter cloacae complex NOT DETECTED NOT DETECTED   Escherichia coli NOT DETECTED NOT DETECTED   Klebsiella aerogenes NOT DETECTED NOT DETECTED   Klebsiella oxytoca NOT DETECTED NOT DETECTED   Klebsiella pneumoniae NOT DETECTED NOT DETECTED   Proteus species NOT DETECTED NOT DETECTED   Salmonella species NOT DETECTED NOT DETECTED   Serratia marcescens NOT DETECTED NOT DETECTED    Haemophilus influenzae NOT DETECTED NOT DETECTED   Neisseria meningitidis NOT DETECTED NOT DETECTED   Pseudomonas aeruginosa NOT DETECTED NOT DETECTED   Stenotrophomonas maltophilia NOT DETECTED NOT DETECTED   Candida albicans NOT DETECTED NOT DETECTED   Candida auris NOT DETECTED NOT DETECTED   Candida glabrata NOT DETECTED NOT DETECTED   Candida krusei NOT DETECTED NOT DETECTED   Candida parapsilosis NOT DETECTED NOT DETECTED   Candida tropicalis NOT DETECTED NOT DETECTED   Cryptococcus neoformans/gattii NOT DETECTED NOT DETECTED   Tobie Lords, PharmD, BCPS Clinical Pharmacist 12/05/2019  7:00 AM

## 2019-12-05 NOTE — Progress Notes (Addendum)
PROGRESS NOTE    Brady Smith  DXA:[id number] DOB: [birth year] DOA: 12/04/2019 PCP: Glean Hess, MD  Assessment & Plan:   Principal Problem:   CAP (community acquired pneumonia) Active Problems:   COPD exacerbation ([place])   Essential hypertension   AA (alcohol abuse)   Paroxysmal atrial fibrillation ([place])   Tobacco abuse  CAP: continue on IV rocephin, azithromycin & IV steroids. Continue on bronchodilators and encourage incentive spirometry. Legionella, strep are pending. 1 of 2 blood cxs growing staph species, sens pending  COPD exacerbation: continue on IV azithromycin for its anti-inflammatory properties. Continue on IV steroids, bronchodilators & encourage incentive spirometry.   Acute hypoxic respiratory failure: secondary to above & management as stated above.   Leukocytosis: infection vs steroid use. Will continue to monitor   HTN: continue on lisinopril. IV hydralazine prn   Tobacco abuse: continue on nicotine patch to prevent w/drawal. Smoking cessation counseling  Alcohol abuse: continue on CIWA protocol. Alcohol cessation counseling   PAF:  Not on anticoagulation for unknown reason, possible fall risk.    DVT prophylaxis: lovenox  Code Status: full  Family Communication: Disposition Plan: depends on PT/OT recs   Consultants:     Procedures:    Antimicrobials: rocephin, azithromycin    Subjective: Pt c/o shortness of breath   Objective: Vitals:   12/04/19 2000 12/04/19 2045 12/04/19 2123 12/05/19 0459  BP: 128/65  136/75 131/66  Pulse: 94 88 81 100  Resp: (!) 23 (!) 22 16 18   Temp:    97.6 F (36.4 C)  TempSrc:    Oral  SpO2: 100% 100% 97% 100%  Weight:      Height:        Intake/Output Summary (Last 24 hours) at 12/05/2019 0748 Last data filed at 12/04/2019 1552 Gross per 24 hour  Intake 350 ml  Output --  Net 350 ml   Filed Weights   12/04/19 0621  Weight: 68 kg    Examination:  General exam: Appears calm and  comfortable.  Respiratory system: diminished breath sounds b/l Cardiovascular system: S1 & S2 +. No ubs, Gastrointestinal system: Abdomen is nondistended, soft and nontender. Normal bowel sounds heard. Central nervous system: Alert and oriented. Moves all 4 extremities . Psychiatry: Judgement and insight appear abnormal. Tangential thinking      Data Reviewed: I have personally reviewed following labs and imaging studies  CBC: Recent Labs  Lab 12/04/19 0625 12/05/19 0631  WBC 9.5 15.5*  HGB 15.4 13.5  HCT 44.7 39.8  MCV 93.5 94.5  PLT 182 209   Basic Metabolic Panel: Recent Labs  Lab 12/04/19 0625 12/05/19 0631  NA 132* 135  K 4.4 4.9  CL 94* 102  CO2 26 24  GLUCOSE 131* 165*  BUN 8 23  CREATININE 0.86 0.97  CALCIUM 9.1 9.2   GFR: Estimated Creatinine Clearance: 62.5 mL/min (by C-G formula based on SCr of 0.97 mg/dL). Liver Function Tests: No results for input(s): AST, ALT, ALKPHOS, BILITOT, PROT, ALBUMIN in the last 168 hours. No results for input(s): LIPASE, AMYLASE in the last 168 hours. No results for input(s): AMMONIA in the last 168 hours. Coagulation Profile: No results for input(s): INR, PROTIME in the last 168 hours. Cardiac Enzymes: No results for input(s): CKTOTAL, CKMB, CKMBINDEX, TROPONINI in the last 168 hours. BNP (last 3 results) No results for input(s): PROBNP in the last 8760 hours. HbA1C: No results for input(s): HGBA1C in the last 72 hours. CBG: No results for input(s): GLUCAP in the  last 168 hours. Lipid Profile: No results for input(s): CHOL, HDL, LDLCALC, TRIG, CHOLHDL, LDLDIRECT in the last 72 hours. Thyroid Function Tests: No results for input(s): TSH, T4TOTAL, FREET4, T3FREE, THYROIDAB in the last 72 hours. Anemia Panel: No results for input(s): VITAMINB12, FOLATE, FERRITIN, TIBC, IRON, RETICCTPCT in the last 72 hours. Sepsis Labs: Recent Labs  Lab 12/04/19 0854  PROCALCITON <0.10    Recent Results (from the past 240 hour(s))    SARS Coronavirus 2 by RT PCR (hospital order, performed in [facility] hospital lab) Nasopharyngeal Nasopharyngeal Swab     Status: None   Collection Time: 12/04/19  6:25 AM   Specimen: Nasopharyngeal Swab  Result Value Ref Range Status   SARS Coronavirus 2 NEGATIVE NEGATIVE Final    Comment: (NOTE) SARS-CoV-2 target nucleic acids are NOT DETECTED.  The SARS-CoV-2 RNA is generally detectable in upper and lower respiratory specimens during the acute phase of infection. The lowest concentration of SARS-CoV-2 viral copies this assay can detect is 250 copies / mL. A negative result does not preclude SARS-CoV-2 infection and should not be used as the sole basis for treatment or other patient management decisions.  A negative result may occur with improper specimen collection / handling, submission of specimen other than nasopharyngeal swab, presence of viral mutation(s) within the areas targeted by this assay, and inadequate number of viral copies (<250 copies / mL). A negative result must be combined with clinical observations, patient history, and epidemiological information.  Fact Sheet for Patients:   StrictlyIdeas.no  Fact Sheet for Healthcare Providers: BankingDealers.co.za  This test is not yet approved or  cleared by the [place] FDA and has been authorized for detection and/or diagnosis of SARS-CoV-2 by FDA under an Emergency Use Authorization (EUA).  This EUA will remain in effect (meaning this test can be used) for the duration of the COVID-19 declaration under Section 564(b)(1) of the Act, 21 U.S.C. section 360bbb-3(b)(1), unless the authorization is terminated or revoked sooner.  Performed at [facility], [address] [place], [place] [address]   CULTURE, BLOOD (ROUTINE X 2) w Reflex to ID Panel     Status: None (Preliminary result)   Collection Time: 12/04/19 12:35 PM   Specimen: BLOOD  Result Value  Ref Range Status   Specimen Description BLOOD RIGHT [facility]  Final   Special Requests   Final    BOTTLES DRAWN AEROBIC AND ANAEROBIC Blood Culture adequate volume   Culture  Setup Time   Final    Organism ID to follow GRAM POSITIVE COCCI AEROBIC BOTTLE ONLY CRITICAL RESULT CALLED TO, READ BACK BY AND VERIFIED WITH: DAVID BESANTI AT 0607 12/05/19 [place] Performed at [facility], [address] [place], [place] [address]    Culture GRAM POSITIVE COCCI  Final   Report Status PENDING  Incomplete  CULTURE, BLOOD (ROUTINE X 2) w Reflex to ID Panel     Status: None (Preliminary result)   Collection Time: 12/04/19 12:35 PM   Specimen: BLOOD  Result Value Ref Range Status   Specimen Description BLOOD RIGHT FORE ARM  Final   Special Requests   Final    BOTTLES DRAWN AEROBIC AND ANAEROBIC Blood Culture adequate volume   Culture   Final    NO GROWTH < 24 HOURS Performed at [facility], [address] [place], [place] [address]    Report Status PENDING  Incomplete  Blood Culture ID Panel (Reflexed)     Status: Abnormal   Collection Time: 12/04/19  12:35 PM  Result Value Ref Range Status   Enterococcus faecalis NOT DETECTED NOT DETECTED Final   Enterococcus Faecium NOT DETECTED NOT DETECTED Final   Listeria monocytogenes NOT DETECTED NOT DETECTED Final   Staphylococcus species DETECTED (A) NOT DETECTED Final    Comment: CRITICAL RESULT CALLED TO, READ BACK BY AND VERIFIED WITH:  DAVID BESANTI AT 0607 12/05/19 SDR    Staphylococcus aureus (BCID) NOT DETECTED NOT DETECTED Final   Staphylococcus epidermidis NOT DETECTED NOT DETECTED Final   Staphylococcus lugdunensis NOT DETECTED NOT DETECTED Final   Streptococcus species NOT DETECTED NOT DETECTED Final   Streptococcus agalactiae NOT DETECTED NOT DETECTED Final   Streptococcus pneumoniae NOT DETECTED NOT DETECTED Final   Streptococcus pyogenes NOT DETECTED NOT DETECTED Final   A.calcoaceticus-baumannii NOT DETECTED NOT DETECTED  Final   Bacteroides fragilis NOT DETECTED NOT DETECTED Final   Enterobacterales NOT DETECTED NOT DETECTED Final   Enterobacter cloacae complex NOT DETECTED NOT DETECTED Final   Escherichia coli NOT DETECTED NOT DETECTED Final   Klebsiella aerogenes NOT DETECTED NOT DETECTED Final   Klebsiella oxytoca NOT DETECTED NOT DETECTED Final   Klebsiella pneumoniae NOT DETECTED NOT DETECTED Final   Proteus species NOT DETECTED NOT DETECTED Final   Salmonella species NOT DETECTED NOT DETECTED Final   Serratia marcescens NOT DETECTED NOT DETECTED Final   Haemophilus influenzae NOT DETECTED NOT DETECTED Final   Neisseria meningitidis NOT DETECTED NOT DETECTED Final   Pseudomonas aeruginosa NOT DETECTED NOT DETECTED Final   Stenotrophomonas maltophilia NOT DETECTED NOT DETECTED Final   Candida albicans NOT DETECTED NOT DETECTED Final   Candida auris NOT DETECTED NOT DETECTED Final   Candida glabrata NOT DETECTED NOT DETECTED Final   Candida krusei NOT DETECTED NOT DETECTED Final   Candida parapsilosis NOT DETECTED NOT DETECTED Final   Candida tropicalis NOT DETECTED NOT DETECTED Final   Cryptococcus neoformans/gattii NOT DETECTED NOT DETECTED Final    Comment: Performed at [facility], [address], [place], [place] [address]         Radiology Studies: DG Chest 1 View  Result Date: 12/04/2019 CLINICAL DATA:  Pt with shob,chest pain and covid exposure. Pt with history of copd. Current smoker. shob, maybe covid exposure EXAM: CHEST  1 VIEW COMPARISON:  11/20/2019 FINDINGS: Patient rotated rightward. Normal cardiac silhouette. Potential patchy airspace disease in the RIGHT lower lobe. LEFT lung relatively clear. No pneumothorax. IMPRESSION: Patchy airspace disease in the RIGHT lower lobe could represent early pneumonia. Electronically Signed   By: Suzy Bouchard M.D.   On: 12/04/2019 07:37        Scheduled Meds: . aspirin EC  81 mg Oral QODAY  . enoxaparin (LOVENOX) injection   40 mg Subcutaneous Q24H  . folic acid  1 mg Oral Daily  . gabapentin  100 mg Oral QHS  . ipratropium-albuterol  3 mL Nebulization Q4H  . lisinopril  10 mg Oral Daily  . LORazepam  0-4 mg Intravenous Q6H   Followed by  . [START ON 12/06/2019] LORazepam  0-4 mg Intravenous Q12H  . methylPREDNISolone (SOLU-MEDROL) injection  40 mg Intravenous Q12H  . multivitamin with minerals  1 tablet Oral Daily  . nicotine  21 mg Transdermal Daily  . thiamine  100 mg Oral Daily   Or  . thiamine  100 mg Intravenous Daily  . triamcinolone ointment  1 application Topical BID   Continuous Infusions: . azithromycin Stopped (12/04/19 1552)  . cefTRIAXone (ROCEPHIN)  IV Stopped (12/04/19 1552)  LOS: 0 days    Time spent: 33 mins     Wyvonnia Dusky, MD [facility] Pager [phone]  If 7PM-7AM, please contact night-coverage [email or web address] 12/05/2019, 7:48 AM

## 2019-12-06 ENCOUNTER — Inpatient Hospital Stay: Payer: Medicare Other

## 2019-12-06 DIAGNOSIS — E875 Hyperkalemia: Secondary | ICD-10-CM

## 2019-12-06 LAB — BASIC METABOLIC PANEL
Anion gap: 8 (ref 5–15)
BUN: 26 mg/dL — ABNORMAL HIGH (ref 8–23)
CO2: 28 mmol/L (ref 22–32)
Calcium: 9.4 mg/dL (ref 8.9–10.3)
Chloride: 101 mmol/L (ref 98–111)
Creatinine, Ser: 0.76 mg/dL (ref 0.61–1.24)
GFR calc Af Amer: >60 mL/min (ref >60)
GFR calc non Af Amer: >60 mL/min (ref >60)
Glucose, Bld: 129 mg/dL — ABNORMAL HIGH (ref 70–99)
Potassium: 5.2 mmol/L — ABNORMAL HIGH (ref 3.5–5.1)
Sodium: 137 mmol/L (ref 135–145)

## 2019-12-06 LAB — CBC
HCT: 44.2 % (ref 39.0–52.0)
Hemoglobin: 15.1 g/dL (ref 13.0–17.0)
MCH: 32.1 pg (ref 26.0–34.0)
MCHC: 34.2 g/dL (ref 30.0–36.0)
MCV: 93.8 fL (ref 80.0–100.0)
Platelets: 264 10*3/uL (ref 150–400)
RBC: 4.71 MIL/uL (ref 4.22–5.81)
RDW: 12.9 % (ref 11.5–15.5)
WBC: 19.1 10*3/uL — ABNORMAL HIGH (ref 4.0–10.5)
nRBC: 0 % (ref 0.0–0.2)

## 2019-12-06 LAB — CULTURE, BLOOD (ROUTINE X 2): Special Requests: ADEQUATE

## 2019-12-06 LAB — POTASSIUM: Potassium: 5.6 mmol/L — ABNORMAL HIGH (ref 3.5–5.1)

## 2019-12-06 LAB — LEGIONELLA PNEUMOPHILA SEROGP 1 UR AG: L. pneumophila Serogp 1 Ur Ag: NEGATIVE

## 2019-12-06 MED ORDER — BISACODYL 5 MG PO TBEC
10.0000 mg | DELAYED_RELEASE_TABLET | Freq: Every day | ORAL | Status: DC | PRN
  Administered 2019-12-06: 10 mg via ORAL
  Filled 2019-12-06: qty 2

## 2019-12-06 MED ORDER — DOCUSATE SODIUM 100 MG PO CAPS
200.0000 mg | ORAL_CAPSULE | Freq: Two times a day (BID) | ORAL | Status: DC
  Administered 2019-12-06 – 2019-12-12 (×8): 200 mg via ORAL
  Filled 2019-12-06 (×11): qty 2

## 2019-12-06 MED ORDER — SODIUM CHLORIDE 0.9 % IV SOLN: INTRAVENOUS | Status: DC | PRN

## 2019-12-06 NOTE — Progress Notes (Signed)
OT Cancellation Note  Patient Details Name: Brady Smith MRN: [id number] DOB: 12/02/1945   Cancelled Treatment:     Reason Eval/Treat Not Completed: Patient not medically ready. Chart reviewed - pt noted to have K+ critically high at 5.2; contraindicated for exertional activity at this time. Will continue to follow and initiate services as pt medically appropriate to participate in therapy.   Josiah Lobo, PhD, [place], OTR/L ascom [phone] 12/06/19, 10:36 AM

## 2019-12-06 NOTE — NC FL2 (Signed)
[place] LEVEL OF CARE SCREENING TOOL     IDENTIFICATION  Patient Name: Brady Smith Birthdate: 04/29/1945 Sex: male Admission Date (Current Location): 12/04/2019  [facility] and [place] Number:      Facility and Address:         Provider Number: [id number]  Attending Physician Name and Address:  Wyvonnia Dusky, MD  Relative Name and Phone Number:       Current Level of Care: Hospital Recommended Level of Care: [place] Prior Approval Number:    Date Approved/Denied:   PASRR Number: [id number]  Discharge Plan: SNF    Current Diagnoses: Patient Active Problem List   Diagnosis Date Noted  . CAP (community acquired pneumonia) 12/04/2019  . Tobacco abuse 12/04/2019  . Dermatitis 11/24/2019  . Senile ecchymosis 08/02/2019  . B12 nutritional deficiency 06/07/2019  . Memory changes 04/24/2018  . Excoriation of scalp 04/24/2018  . Paroxysmal atrial fibrillation ([place]) 06/13/2017  . Elevated blood sugar 02/03/2017  . S/P amputation of lesser toe ([place]) 07/25/2015  . Peripheral vascular disease ([place]) 07/14/2015  . AA (alcohol abuse) 05/23/2015  . Urinary retention 05/18/2015  . Essential hypertension 05/18/2015  . Choroidal nevus, right eye 02/08/2015  . Carpal tunnel syndrome 01/13/2015  . H/O gastrointestinal disease 01/13/2015  . History of primary malignant neoplasm of urinary bladder 01/13/2015  . Compulsive tobacco user syndrome 01/13/2015  . HLD (hyperlipidemia) 01/13/2015  . Neurosis, phobic 01/13/2015  . COPD exacerbation ([place]) 06/11/2011  . Lung nodule, solitary 06/11/2011    Orientation RESPIRATION BLADDER Height & Weight     Self, Situation, Place  Normal Incontinent, External catheter Weight: 68 kg Height:  5\' 7"  (170.2 cm)  BEHAVIORAL SYMPTOMS/MOOD NEUROLOGICAL BOWEL NUTRITION STATUS      Continent Diet (Heart Healthy)  AMBULATORY STATUS COMMUNICATION OF NEEDS Skin   Extensive Assist Verbally Normal                        Personal Care Assistance Level of Assistance              Functional Limitations Info             SPECIAL CARE FACTORS FREQUENCY  PT (By licensed PT), OT (By licensed OT)                    Contractures Contractures Info: Not present    Additional Factors Info  Code Status, Allergies Code Status Info: Full Allergies Info: influenza vaccine, lipitor           Current Medications (12/06/2019):  This is the current hospital active medication list Current Facility-Administered Medications  Medication Dose Route Frequency Provider Last Rate Last Admin  . 0.9 %  sodium chloride infusion   Intravenous PRN Wyvonnia Dusky, MD 10 mL/hr at 12/06/19 1144 New Bag at 12/06/19 1144  . acetaminophen (TYLENOL) tablet 650 mg  650 mg Oral Q6H PRN Ivor Costa, MD   650 mg at 12/05/19 1640  . albuterol (PROVENTIL) (2.5 MG/3ML) 0.083% nebulizer solution 2.5 mg  2.5 mg Nebulization Q4H PRN Ivor Costa, MD      . alum & mag hydroxide-simeth (MAALOX/MYLANTA) 200-200-20 MG/5ML suspension 30 mL  30 mL Oral Q4H PRN Wyvonnia Dusky, MD      . aspirin EC tablet 81 mg  81 mg Oral Henreitta Cea, MD   81 mg at 12/06/19 1102  . azithromycin (ZITHROMAX) 500 mg in sodium chloride 0.9 %  250 mL IVPB  500 mg Intravenous Q24H Ivor Costa, MD 250 mL/hr at 12/06/19 1318 500 mg at 12/06/19 1318  . bisacodyl (DULCOLAX) EC tablet 10 mg  10 mg Oral Daily PRN Wyvonnia Dusky, MD   10 mg at 12/06/19 1101  . cefTRIAXone (ROCEPHIN) 1 g in sodium chloride 0.9 % 100 mL IVPB  1 g Intravenous Q24H Ivor Costa, MD 200 mL/hr at 12/06/19 1144 1 g at 12/06/19 1144  . dextromethorphan-guaiFENesin (MUCINEX DM) 30-600 MG per 12 hr tablet 1 tablet  1 tablet Oral BID PRN Ivor Costa, MD      . docusate sodium (COLACE) capsule 200 mg  200 mg Oral BID Wyvonnia Dusky, MD   200 mg at 12/06/19 1101  . enoxaparin (LOVENOX) injection 40 mg  40 mg Subcutaneous Q24H Ivor Costa, MD   40 mg at 77/93/90 3009   . folic acid (FOLVITE) tablet 1 mg  1 mg Oral Daily Ivor Costa, MD   1 mg at 12/06/19 1102  . gabapentin (NEURONTIN) capsule 100 mg  100 mg Oral QHS Ivor Costa, MD   100 mg at 12/05/19 2126  . hydrALAZINE (APRESOLINE) injection 5 mg  5 mg Intravenous Q2H PRN Ivor Costa, MD      . ipratropium-albuterol (DUONEB) 0.5-2.5 (3) MG/3ML nebulizer solution 3 mL  3 mL Nebulization Q6H Wyvonnia Dusky, MD   3 mL at 12/06/19 0810  . lisinopril (ZESTRIL) tablet 10 mg  10 mg Oral Daily Ivor Costa, MD   10 mg at 12/06/19 1101  . LORazepam (ATIVAN) injection 0-4 mg  0-4 mg Intravenous Q12H Ivor Costa, MD      . LORazepam (ATIVAN) tablet 1-4 mg  1-4 mg Oral Q1H PRN Ivor Costa, MD       Or  . LORazepam (ATIVAN) injection 1-4 mg  1-4 mg Intravenous Q1H PRN Ivor Costa, MD      . methylPREDNISolone sodium succinate (SOLU-MEDROL) 40 mg/mL injection 40 mg  40 mg Intravenous Q12H Ivor Costa, MD   40 mg at 12/06/19 0602  . morphine 2 MG/ML injection 1 mg  1 mg Intravenous Q4H PRN Wyvonnia Dusky, MD   1 mg at 12/06/19 1134  . multivitamin with minerals tablet 1 tablet  1 tablet Oral Daily Ivor Costa, MD   1 tablet at 12/06/19 1102  . nicotine (NICODERM CQ - dosed in mg/24 hours) patch 21 mg  21 mg Transdermal Daily Ivor Costa, MD   21 mg at 12/06/19 1108  . ondansetron (ZOFRAN) injection 4 mg  4 mg Intravenous Q8H PRN Ivor Costa, MD   4 mg at 12/05/19 1640  . promethazine (PHENERGAN) injection 12.5 mg  12.5 mg Intravenous Q6H PRN Wyvonnia Dusky, MD      . thiamine tablet 100 mg  100 mg Oral Daily Ivor Costa, MD   100 mg at 12/06/19 1101   Or  . thiamine (B-1) injection 100 mg  100 mg Intravenous Daily Ivor Costa, MD      . triamcinolone ointment (KENALOG) 0.5 % 1 application  1 application Topical BID Ivor Costa, MD   1 application at 23/30/07 1146     Discharge Medications: Please see discharge summary for a list of discharge medications.  Relevant Imaging Results:  Relevant Lab  Results:   Additional Information SS: [id number]  Beverly Sessions, RN

## 2019-12-06 NOTE — Evaluation (Signed)
Physical Therapy Evaluation Patient Details Name: Brady Smith MRN: [id number] DOB: 1945-10-10 Today's Date: 12/06/2019   History of Present Illness   Brady Smith is a 74 y.o. male with medical history significant of COPD, hypertension, hyperlipidemia, GERD, alcohol abuse, tobacco abuse, PAF not on anticoagulants, GERD, depression, anxiety, bladder cancer, and PVD. He presents with COPD exasperation with a cough and shortness of breath.  Clinical Impression  Patient presents in fowler's position finishing up breakfast in bed. He reports 10/10 stomach pain where his hernia is, but able to is still agreeable to PT evaluation at this time. Prior to this hospital stay, patient is independent with all ADLs and does not use any AD for ambulation. He is A&O x 4, but hard to understand what he is saying due to low volume and mumbling. During bed mobility, patient able to come to EOB with no external assistance but takes extended time. Patient is impuslive at times as he almost stood up before it was safe to. From sit<>stand, patient is a min A +1 and requires use of RW. During sit to stand, he was unsteady with one hand on RW and the other hand on bed. Patient left in recliner chair with all needs. Patient able to ambulate 2 feet with RW with decreased step length and min A +1 for safety and managing cords. Monitored O2 levels throughout session ranging from 88-94, and HR ranging from 100-115 bpm. Patient would benefit from continued skilled therapy during stay and would recommend SNF in order to increase strength, balance, and return to PLOF with minimal difficulties.    Follow Up Recommendations SNF    Equipment Recommendations  Other (comment) (Might have cane and walker but unsure)    Recommendations for Other Services       Precautions / Restrictions Precautions Precautions: Fall Restrictions Weight Bearing Restrictions: No      Mobility  Bed Mobility Overal bed mobility: Modified  Independent             General bed mobility comments: Takes extended time, but able to complete on his own with verbal cues once EOB.  Transfers Overall transfer level: Needs assistance Equipment used: Rolling walker (2 wheeled) Transfers: Sit to/from Stand Sit to Stand: Min assist         General transfer comment: Patient was unsteady when getting up from the bed but was able to stabilize once standing. VCs needed for proper hand placement.  Ambulation/Gait Ambulation/Gait assistance: Min guard Gait Distance (Feet): 2 Feet Assistive device: Rolling walker (2 wheeled) Gait Pattern/deviations: Decreased step length - right;Decreased step length - left;Step-to pattern     General Gait Details: Walked from the bed to the recliner chair. He had limited step length due to short O2 cord length. Pt instructed to keep RW close to him when walking backwards.  Stairs            Wheelchair Mobility    Modified Rankin (Stroke Patients Only)       Balance Overall balance assessment: Needs assistance Sitting-balance support: Feet supported;Single extremity supported Sitting balance-Leahy Scale: Fair Sitting balance - Comments: Patient has posterior lean when sitting, but doesn't realize he is leaning backwards; Feet supported with 1 UE resting on bed Postural control: Posterior lean Standing balance support: Bilateral upper extremity supported Standing balance-Leahy Scale: Fair Standing balance comment: Patient able to stand using the RW without LOB  Pertinent Vitals/Pain Pain Assessment: 0-10 Pain Score: 10-Worst pain ever Pain Location: Stomach due to hernia Pain Descriptors / Indicators: Aching;Throbbing Pain Intervention(s): Monitored during session    Home Living Family/patient expects to be discharged to: Private residence  Living arrangements: Group Home Available Help at Discharge: Friend(s), They are sometimes  unavailable when he calls them for help.  Type of Home: House with 9 stairs to enter with a rail on L. He tries not to touch rails due to cleanliness level. Home Layout: Two level Alternate Level Stairs - Number of Steps: 16 Alternate Level Stairs - Rails: Left when going up the stairs Home equipment: He has a cane and walker, but unsure what kind   Additional Comments: Patient came from a group home but doesn't want to go return.    Prior Function Level of Independence: Independent               Hand Dominance   Dominant Hand: Right    Extremity/Trunk Assessment   Upper Extremity Assessment Upper Extremity Assessment: Generalized weakness;LUE deficits/detail;RUE deficits/detail RUE Deficits / Details: Generally 3+ to 4/5 for RUE strength LUE Deficits / Details: Generally 3+ to 4/5 for LUE strength    Lower Extremity Assessment Lower Extremity Assessment: Generalized weakness (Generally 3+ to 4/5 for BLE strength)       Communication   Communication: Other (comment) (Hard to understand pt due to speaking quietly and mumbling)  Cognition Arousal/Alertness: Lethargic Behavior During Therapy: Flat affect;WFL for tasks assessed/performed;Impulsive Overall Cognitive Status: No family/caregiver present to determine baseline cognitive functioning                                 General Comments: He wanted to stand up right as he got to the EOB with no gait belt on, had to tell him it was unsafe; Hard to understand what he was saying and no one there to verify information      General Comments General comments (skin integrity, edema, etc.): Patient has a stomach hernia that is painful, gait belt was placed under arms    Exercises Other Exercises Other Exercises: Alternating sitting hip marches x 2 each LE   Assessment/Plan    PT Assessment Patient needs continued PT services  PT Problem List Decreased strength;Decreased balance;Decreased mobility;Decreased  activity tolerance;Decreased knowledge of use of DME;Decreased safety awareness;Pain       PT Treatment Interventions Gait training;Stair training;Functional mobility training;Therapeutic activities;Therapeutic exercise;Balance training;Neuromuscular re-education    PT Goals (Current goals can be found in the Care Plan section)  Acute Rehab PT Goals: To go home PT Goal Formulation: With patient Time For Goal Achievement: 12/20/19 Potential to Achieve Goals: Fair    Frequency Min 2X/week   Barriers to discharge Decreased caregiver support Patient has friends who he can call, but aren't always available to help    Co-evaluation               AM-PAC PT "6 Clicks" Mobility  Outcome Measure Help needed turning from your back to your side while in a flat bed without using bedrails?: None Help needed moving from lying on your back to sitting on the side of a flat bed without using bedrails?: None Help needed moving to and from a bed to a chair (including a wheelchair)?: A Little Help needed standing up from a chair using your arms (e.g., wheelchair or bedside chair)?: A Lot Help needed to walk in  hospital room?: A Lot Help needed climbing 3-5 steps with a railing? : A Lot 6 Click Score: 17    End of Session Equipment Utilized During Treatment: Gait belt;Oxygen Activity Tolerance: Patient tolerated treatment well Patient left: in chair;with call bell/phone within reach;with chair alarm set Nurse Communication: Mobility status PT Visit Diagnosis: Unsteadiness on feet (R26.81);Other abnormalities of gait and mobility (R26.89);Muscle weakness (generalized) (M62.81)    Time: 7990-9400 PT Time Calculation (min) (ACUTE ONLY): 43 min   Charges:              Noemi Chapel, SPT Bernita Raisin 12/06/2019, 11:47 AM

## 2019-12-06 NOTE — TOC Initial Note (Signed)
Transition of Care [facility]) - Initial/Assessment Note    Patient Details  Name: Brady Smith MRN: [id number] Date of Birth: 23-Mar-1946  [facility] CM/SW Contact:    Beverly Sessions, RN Phone Number: 12/06/2019, 2:14 PM  Clinical Narrative:                 Unable to understand patient at this time to complete assessment.   Reached out to brother [name]. Per [name] patient does not live in a group home.  He lives in a boarding house type setting where pays monthly to rent a room  Brother states at baseline patient drives, and ambulates independently.  I have notified brother that PT has worked with patient and recommends SNF.  Brother states that patient has been to [employer] and rehab before and that would be their first preference  TOC to follow up with patient tomorrow.   Existing PASRR Fl2 sent for signature.  Bedsearch initiated   Expected Discharge Plan: [place]     Patient Goals and CMS Choice        Expected Discharge Plan and Services Expected Discharge Plan: [place]                                              Prior Living Arrangements/Services                       Activities of Daily Living Home Assistive Devices/Equipment: None ADL Screening (condition at time of admission) Patient's cognitive ability adequate to safely complete daily activities?: Yes Is the patient deaf or have difficulty hearing?: No Does the patient have difficulty seeing, even when wearing glasses/contacts?: No Does the patient have difficulty concentrating, remembering, or making decisions?: No Patient able to express need for assistance with ADLs?: Yes Does the patient have difficulty dressing or bathing?: No Independently performs ADLs?: Yes (appropriate for developmental age) Does the patient have difficulty walking or climbing stairs?: No Weakness of Legs: Both Weakness of Arms/Hands: None  Permission  Sought/Granted                  Emotional Assessment       Orientation: : Fluctuating Orientation (Suspected and/or reported Sundowners) Alcohol / Substance Use: Alcohol Use Psych Involvement: No (comment)  Admission diagnosis:  Shortness of breath [R06.02] COPD exacerbation ([place]) [J44.1] CAP (community acquired pneumonia) [J18.9] Patient Active Problem List   Diagnosis Date Noted  . CAP (community acquired pneumonia) 12/04/2019  . Tobacco abuse 12/04/2019  . Dermatitis 11/24/2019  . Senile ecchymosis 08/02/2019  . B12 nutritional deficiency 06/07/2019  . Memory changes 04/24/2018  . Excoriation of scalp 04/24/2018  . Paroxysmal atrial fibrillation ([place]) 06/13/2017  . Elevated blood sugar 02/03/2017  . S/P amputation of lesser toe ([place]) 07/25/2015  . Peripheral vascular disease ([place]) 07/14/2015  . AA (alcohol abuse) 05/23/2015  . Urinary retention 05/18/2015  . Essential hypertension 05/18/2015  . Choroidal nevus, right eye 02/08/2015  . Carpal tunnel syndrome 01/13/2015  . H/O gastrointestinal disease 01/13/2015  . History of primary malignant neoplasm of urinary bladder 01/13/2015  . Compulsive tobacco user syndrome 01/13/2015  . HLD (hyperlipidemia) 01/13/2015  . Neurosis, phobic 01/13/2015  . COPD exacerbation ([place]) 06/11/2011  . Lung nodule, solitary 06/11/2011   PCP:  Glean Hess, MD Pharmacy:   [facility] [address], [place] [place] [address] Phone: [phone] Fax: [place], [place] - [address] [place] [address] Phone: [phone] Fax: [place], [place] [address] Phone: [phone] Fax: [phone]     Social Determinants of Health (SDOH) Interventions    Readmission Risk  Interventions No flowsheet data found.

## 2019-12-06 NOTE — Progress Notes (Signed)
PROGRESS NOTE    Brady Smith  JFH:[id number] DOB: 08/25/1945 DOA: 12/04/2019 PCP: Glean Hess, MD  Assessment & Plan:   Principal Problem:   CAP (community acquired pneumonia) Active Problems:   COPD exacerbation ([place])   Essential hypertension   AA (alcohol abuse)   Paroxysmal atrial fibrillation ([place])   Tobacco abuse  CAP: continue on IV rocephin, azithromycin & IV steroids. Continue on bronchodilators and encourage incentive spirometry. Legionella is pending. Strep is neg. 1 of 2 blood cxs growing staph species, likely contaminant. Repeat blood cxs ordered   COPD exacerbation: continue on IV azithromycin for its anti-inflammatory properties. Continue on IV steroids, bronchodilators & encourage incentive spirometry.   Alcohol abuse: continue on CIWA protocol. Alcohol cessation counseling.   Acute hypoxic respiratory failure: secondary to COPD & CAP. Continue on supplemental oxygen and wean as tolerated  Hyperkalemia: repeat K ordered. Will continue to monitor   Hx of hernia: ventral vs umbilical hernia. XR abd ordered.   Leukocytosis: infection vs steroid use. Will continue to monitor   HTN: continue on lisinopril. IV hydralazine prn   Tobacco abuse: continue on nicotine patch to prevent w/drawal. Smoking cessation counseling  PAF:  Not on anticoagulation for unknown reason, possible fall risk.    DVT prophylaxis: lovenox  Code Status: full  Family Communication: Disposition Plan: depends on PT/OT recs   Status is: Inpatient  Remains inpatient appropriate because:IV treatments appropriate due to intensity of illness or inability to take PO, still scoring on CIWA, PT/OT still need to see the pt   Dispo: The patient is from: Home              Anticipated d/c is to: SNF              Anticipated d/c date is: 3 days              Patient currently is not medically stable to d/c.        Consultants:     Procedures:    Antimicrobials: rocephin,  azithromycin    Subjective: Pt c/o abd pain   Objective: Vitals:   12/05/19 1206 12/05/19 1645 12/05/19 2104 12/06/19 0550  BP: 138/63 (!) 122/92 (!) 165/84 134/74  Pulse: (!) 109 91 91 81  Resp: 16  20 20   Temp: 97.6 F (36.4 C)  97.8 F (36.6 C) (!) 97.4 F (36.3 C)  TempSrc: Oral  Oral Oral  SpO2: 95%  90% 91%  Weight:      Height:        Intake/Output Summary (Last 24 hours) at 12/06/2019 0801 Last data filed at 12/06/2019 0700 Gross per 24 hour  Intake 240 ml  Output 1100 ml  Net -860 ml   Filed Weights   12/04/19 0621  Weight: 68 kg    Examination: General exam: Appears lethargic Respiratory system: decreased breath sounds b/l. No rales Cardiovascular system: S1 & S2 +. No rubs, or gallops Gastrointestinal system: Abdomen is nondistended, soft and nontender. Hypoactive bowel sounds  Central nervous system: lethargic. Moves all 4 extremities . Psychiatry: Judgement and insight appear abnormal. Flat mood and affect      Data Reviewed: I have personally reviewed following labs and imaging studies  CBC: Recent Labs  Lab 12/04/19 0625 12/05/19 0631 12/06/19 0709  WBC 9.5 15.5* 19.1*  HGB 15.4 13.5 15.1  HCT 44.7 39.8 44.2  MCV 93.5 94.5 93.8  PLT 182 204 937   Basic Metabolic Panel: Recent Labs  Lab 12/04/19  1610 12/05/19 0631  NA 132* 135  K 4.4 4.9  CL 94* 102  CO2 26 24  GLUCOSE 131* 165*  BUN 8 23  CREATININE 0.86 0.97  CALCIUM 9.1 9.2   GFR: Estimated Creatinine Clearance: 62.5 mL/min (by C-G formula based on SCr of 0.97 mg/dL). Liver Function Tests: No results for input(s): AST, ALT, ALKPHOS, BILITOT, PROT, ALBUMIN in the last 168 hours. No results for input(s): LIPASE, AMYLASE in the last 168 hours. No results for input(s): AMMONIA in the last 168 hours. Coagulation Profile: No results for input(s): INR, PROTIME in the last 168 hours. Cardiac Enzymes: No results for input(s): CKTOTAL, CKMB, CKMBINDEX, TROPONINI in the last 168  hours. BNP (last 3 results) No results for input(s): PROBNP in the last 8760 hours. HbA1C: No results for input(s): HGBA1C in the last 72 hours. CBG: No results for input(s): GLUCAP in the last 168 hours. Lipid Profile: No results for input(s): CHOL, HDL, LDLCALC, TRIG, CHOLHDL, LDLDIRECT in the last 72 hours. Thyroid Function Tests: No results for input(s): TSH, T4TOTAL, FREET4, T3FREE, THYROIDAB in the last 72 hours. Anemia Panel: No results for input(s): VITAMINB12, FOLATE, FERRITIN, TIBC, IRON, RETICCTPCT in the last 72 hours. Sepsis Labs: Recent Labs  Lab 12/04/19 0854  PROCALCITON <0.10    Recent Results (from the past 240 hour(s))  SARS Coronavirus 2 by RT PCR (hospital order, performed in [facility] hospital lab) Nasopharyngeal Nasopharyngeal Swab     Status: None   Collection Time: 12/04/19  6:25 AM   Specimen: Nasopharyngeal Swab  Result Value Ref Range Status   SARS Coronavirus 2 NEGATIVE NEGATIVE Final    Comment: (NOTE) SARS-CoV-2 target nucleic acids are NOT DETECTED.  The SARS-CoV-2 RNA is generally detectable in upper and lower respiratory specimens during the acute phase of infection. The lowest concentration of SARS-CoV-2 viral copies this assay can detect is 250 copies / mL. A negative result does not preclude SARS-CoV-2 infection and should not be used as the sole basis for treatment or other patient management decisions.  A negative result may occur with improper specimen collection / handling, submission of specimen other than nasopharyngeal swab, presence of viral mutation(s) within the areas targeted by this assay, and inadequate number of viral copies (<250 copies / mL). A negative result must be combined with clinical observations, patient history, and epidemiological information.  Fact Sheet for Patients:   StrictlyIdeas.no  Fact Sheet for Healthcare Providers: BankingDealers.co.za  This test is  not yet approved or  cleared by the [place] FDA and has been authorized for detection and/or diagnosis of SARS-CoV-2 by FDA under an Emergency Use Authorization (EUA).  This EUA will remain in effect (meaning this test can be used) for the duration of the COVID-19 declaration under Section 564(b)(1) of the Act, 21 U.S.C. section 360bbb-3(b)(1), unless the authorization is terminated or revoked sooner.  Performed at [facility], [address] [place], [place] [address]   CULTURE, BLOOD (ROUTINE X 2) w Reflex to ID Panel     Status: None (Preliminary result)   Collection Time: 12/04/19 12:35 PM   Specimen: BLOOD  Result Value Ref Range Status   Specimen Description BLOOD RIGHT [facility]  Final   Special Requests   Final    BOTTLES DRAWN AEROBIC AND ANAEROBIC Blood Culture adequate volume   Culture  Setup Time   Final    Organism ID to follow GRAM POSITIVE COCCI AEROBIC BOTTLE ONLY CRITICAL RESULT CALLED TO, READ BACK BY AND VERIFIED  WITH: DAVID BESANTI AT 0607 12/05/19 SDR Performed at [facility], [address] [place], [place] [address]    Culture [facility] POSITIVE COCCI  Final   Report Status PENDING  Incomplete  CULTURE, BLOOD (ROUTINE X 2) w Reflex to ID Panel     Status: None (Preliminary result)   Collection Time: 12/04/19 12:35 PM   Specimen: BLOOD  Result Value Ref Range Status   Specimen Description BLOOD RIGHT FORE ARM  Final   Special Requests   Final    BOTTLES DRAWN AEROBIC AND ANAEROBIC Blood Culture adequate volume   Culture   Final    NO GROWTH 2 DAYS Performed at [facility], [address], [place], [place] [address]    Report Status PENDING  Incomplete  Blood Culture ID Panel (Reflexed)     Status: Abnormal   Collection Time: 12/04/19 12:35 PM  Result Value Ref Range Status   Enterococcus faecalis NOT DETECTED NOT DETECTED Final   Enterococcus Faecium NOT DETECTED NOT DETECTED Final   Listeria monocytogenes NOT DETECTED NOT  DETECTED Final   Staphylococcus species DETECTED (A) NOT DETECTED Final    Comment: CRITICAL RESULT CALLED TO, READ BACK BY AND VERIFIED WITH:  DAVID BESANTI AT 0607 12/05/19 SDR    Staphylococcus aureus (BCID) NOT DETECTED NOT DETECTED Final   Staphylococcus epidermidis NOT DETECTED NOT DETECTED Final   Staphylococcus lugdunensis NOT DETECTED NOT DETECTED Final   Streptococcus species NOT DETECTED NOT DETECTED Final   Streptococcus agalactiae NOT DETECTED NOT DETECTED Final   Streptococcus pneumoniae NOT DETECTED NOT DETECTED Final   Streptococcus pyogenes NOT DETECTED NOT DETECTED Final   A.calcoaceticus-baumannii NOT DETECTED NOT DETECTED Final   Bacteroides fragilis NOT DETECTED NOT DETECTED Final   Enterobacterales NOT DETECTED NOT DETECTED Final   Enterobacter cloacae complex NOT DETECTED NOT DETECTED Final   Escherichia coli NOT DETECTED NOT DETECTED Final   Klebsiella aerogenes NOT DETECTED NOT DETECTED Final   Klebsiella oxytoca NOT DETECTED NOT DETECTED Final   Klebsiella pneumoniae NOT DETECTED NOT DETECTED Final   Proteus species NOT DETECTED NOT DETECTED Final   Salmonella species NOT DETECTED NOT DETECTED Final   Serratia marcescens NOT DETECTED NOT DETECTED Final   Haemophilus influenzae NOT DETECTED NOT DETECTED Final   Neisseria meningitidis NOT DETECTED NOT DETECTED Final   Pseudomonas aeruginosa NOT DETECTED NOT DETECTED Final   Stenotrophomonas maltophilia NOT DETECTED NOT DETECTED Final   Candida albicans NOT DETECTED NOT DETECTED Final   Candida auris NOT DETECTED NOT DETECTED Final   Candida glabrata NOT DETECTED NOT DETECTED Final   Candida krusei NOT DETECTED NOT DETECTED Final   Candida parapsilosis NOT DETECTED NOT DETECTED Final   Candida tropicalis NOT DETECTED NOT DETECTED Final   Cryptococcus neoformans/gattii NOT DETECTED NOT DETECTED Final    Comment: Performed at [facility], [address] [place], [place] [address]  Culture,  sputum-assessment     Status: None   Collection Time: 12/05/19 10:05 AM   Specimen: Sputum  Result Value Ref Range Status   Specimen Description SPUTUM  Final   Special Requests NONE  Final   Sputum evaluation   Final    Sputum specimen not acceptable for testing.  Please recollect.   Performed at [facility], [address] [place], [place] [address]    Report Status 12/05/2019 FINAL  Final         Radiology Studies: No results found.      Scheduled Meds: . aspirin EC  81 mg Oral QODAY  . enoxaparin (LOVENOX) injection  40 mg Subcutaneous Q24H  . folic acid  1 mg Oral Daily  . gabapentin  100 mg Oral QHS  . ipratropium-albuterol  3 mL Nebulization Q6H  . lisinopril  10 mg Oral Daily  . LORazepam  0-4 mg Intravenous Q6H   Followed by  . LORazepam  0-4 mg Intravenous Q12H  . methylPREDNISolone (SOLU-MEDROL) injection  40 mg Intravenous Q12H  . multivitamin with minerals  1 tablet Oral Daily  . nicotine  21 mg Transdermal Daily  . thiamine  100 mg Oral Daily   Or  . thiamine  100 mg Intravenous Daily  . triamcinolone ointment  1 application Topical BID   Continuous Infusions: . azithromycin 500 mg (12/05/19 1214)  . cefTRIAXone (ROCEPHIN)  IV 1 g (12/05/19 1214)     LOS: 1 day    Time spent: 30 mins     Wyvonnia Dusky, MD [facility] Pager [phone]  If 7PM-7AM, please contact night-coverage [email or web address] 12/06/2019, 8:01 AM

## 2019-12-06 NOTE — Progress Notes (Signed)
Mobility Specialist - Progress Note   12/06/19 1342  Mobility  Activity Ambulated to bathroom  Level of Assistance Contact guard assist, steadying assist  Assistive Device None (Pt pushed IV pole)  Distance Ambulated (ft) 30 ft  Mobility Response Tolerated well  Mobility performed by Mobility specialist  $Mobility charge 1 Mobility    Pt was received in hallway, trying to find nurse to ask about medications. With noted LOB, mobility tech ran over to assist pt. Per chart review, pt's K+ levels were 5.2, but d/t unsafe gait and pt already up and moving, mobility wanted to ensure that the pt could get back to bed safely. Pt c/o pain in abdomen "10/10" and is requesting morphine. Nurse was notified. Pt appeared agitated and mobility reassured pt that safety measures needed to be taken into consideration before ambulating. Once mobility tech got the pt to a calmed state, pt attempted to ambulate to the bathroom. Mobility tech assisted. Noted LOBx3 with unsteady gait pattern. Pt stated he hadn't had a BM in 3 days and was unsuccessful this attempt as well. Pt was left in bed with phone/call bell in reach and bed alarm set. Pt was instructed to utilize his call bell if he needed to reach his nurse. Pt communicated understanding. Vitals were not taken this session for reasons above.    Kathee Delton Mobility Specialist 12/06/19, 1:50 PM

## 2019-12-07 ENCOUNTER — Inpatient Hospital Stay: Payer: Medicare Other

## 2019-12-07 LAB — BASIC METABOLIC PANEL
Anion gap: 10 (ref 5–15)
BUN: 44 mg/dL — ABNORMAL HIGH (ref 8–23)
CO2: 31 mmol/L (ref 22–32)
Calcium: 10 mg/dL (ref 8.9–10.3)
Chloride: 98 mmol/L (ref 98–111)
Creatinine, Ser: 1.04 mg/dL (ref 0.61–1.24)
GFR calc Af Amer: >60 mL/min (ref >60)
GFR calc non Af Amer: >60 mL/min (ref >60)
Glucose, Bld: 175 mg/dL — ABNORMAL HIGH (ref 70–99)
Potassium: 5.3 mmol/L — ABNORMAL HIGH (ref 3.5–5.1)
Sodium: 139 mmol/L (ref 135–145)

## 2019-12-07 LAB — CBC
HCT: 49.8 % (ref 39.0–52.0)
Hemoglobin: 16.3 g/dL (ref 13.0–17.0)
MCH: 31.8 pg (ref 26.0–34.0)
MCHC: 32.7 g/dL (ref 30.0–36.0)
MCV: 97.1 fL (ref 80.0–100.0)
Platelets: 279 10*3/uL (ref 150–400)
RBC: 5.13 MIL/uL (ref 4.22–5.81)
RDW: 12.8 % (ref 11.5–15.5)
WBC: 21.3 10*3/uL — ABNORMAL HIGH (ref 4.0–10.5)
nRBC: 0 % (ref 0.0–0.2)

## 2019-12-07 MED ORDER — IOHEXOL 9 MG/ML PO SOLN
500.0000 mL | ORAL | Status: AC
  Administered 2019-12-07 (×2): 500 mL via ORAL

## 2019-12-07 MED ORDER — LORAZEPAM 2 MG/ML IJ SOLN
1.0000 mg | INTRAMUSCULAR | Status: DC | PRN
  Administered 2019-12-08 – 2019-12-09 (×3): 2 mg via INTRAVENOUS
  Filled 2019-12-07 (×4): qty 1

## 2019-12-07 MED ORDER — SODIUM CHLORIDE 0.9 % IV SOLN: INTRAVENOUS | Status: DC

## 2019-12-07 MED ORDER — LORAZEPAM 1 MG PO TABS: 1.0000 mg | ORAL_TABLET | ORAL | Status: DC | PRN

## 2019-12-07 MED ORDER — SODIUM ZIRCONIUM CYCLOSILICATE 10 G PO PACK
10.0000 g | PACK | Freq: Once | ORAL | Status: AC
  Administered 2019-12-07: 10 g via ORAL
  Filled 2019-12-07: qty 1

## 2019-12-07 NOTE — TOC Progression Note (Signed)
Transition of Care [facility]) - Progression Note    Patient Details  Name: Brady Smith MRN: [id number] Date of Birth: 06/11/1945  Transition of Care [facility]) CM/SW Contact  Beverly Sessions, RN Phone Number: 12/07/2019, 3:33 PM  Clinical Narrative:     Patient lethargic. Will follow up and provide bed offers when appropriate   Expected Discharge Plan: [place]    Expected Discharge Plan and Services Expected Discharge Plan: [place]                                               Social Determinants of Health (SDOH) Interventions    Readmission Risk Interventions No flowsheet data found.

## 2019-12-07 NOTE — Progress Notes (Signed)
°   12/07/19 2037  Assess: MEWS Score  Temp 97.8 F (36.6 C)  BP 128/76  Pulse Rate (!) 104  Resp (!) 22  Level of Consciousness Alert  SpO2 90 %  O2 Device Nasal Cannula  O2 Flow Rate (L/min) 2 L/min  Assess: MEWS Score  MEWS Temp 0  MEWS Systolic 0  MEWS Pulse 1  MEWS RR 1  MEWS LOC 0  MEWS Score 2  MEWS Score Color Yellow  Assess: if the MEWS score is Yellow or Red  Were vital signs taken at a resting state? No (pt was nauseous and retching)  Focused Assessment No change from prior assessment  Early Detection of Sepsis Score *See Row Information* Low  MEWS guidelines implemented *See Row Information* No, vital signs rechecked (will give antiemetic and recheck in resting state)  Pt was  complaining of nausea, will give prn meds and recheck VS after.

## 2019-12-07 NOTE — Evaluation (Signed)
Occupational Therapy Evaluation Patient Details Name: Brady Smith MRN: [id number] DOB: 05-14-1945 Today's Date: 12/07/2019    History of Present Illness  SORREN Smith is a 74 y.o. male with medical history significant of COPD, hypertension, hyperlipidemia, GERD, alcohol abuse, tobacco abuse, PAF not on anticoagulants, GERD, depression, anxiety, bladder cancer, and PVD. He presents with COPD exasperation with a cough and shortness of breath.   Clinical Impression   Saw Mr. Goldwater for OT evaluation after receiving clearance from MD, given concerns about pt's K+ level of 5.3 Pt found lying in bed, nude, lethargic; however, was oriented to person, place, and time. Prior to hospital admission, pt states he was living in a group home with 14 residents, but that he does not want to return there. States there have been numerous COVID cases and that residents "do drugs." Pt states he was IND in ADL, that he drives, and that he likes to spend his days sitting on park benches. He says that he "does not like to be around other people."  Pt came to EOB sit with Min A and then was able to don a hospital gown with MinA and VCs for threading arms. With feet on floor, pt was mostly able to sustain good static sitting balance, with CGA, but would on occasion fall slightly backwards or to left; able to right himself once given VCs. Pt engaged in grooming and UB bathing while seated EOB, with CGA, but required frequent encouragement/VCs to continue with task. Pt unable to track a moving finger with his eyes, although this may be because he could not follow the instructions, despite several repetitions/explanations of the task. Pt transitioned sit<>stand with RW and MinA and no LOB with standing. Engaged in therex in standing, with minimal LOB on several occassions, but able to self-correct. Pt said he was tired and wanted to lie back down, requiring MinA for return to supine in bed, with HOB elevated. Pt began snoring  within ~ 1 minute of return to bed.  Pt stated he had 10/10 pain "all over," but could not describe pain nor offer a more specific location. Pt left in bed, but OT was unable to locate a call bell within the room. Nurse informed of both these issues.  Pt demonstrates impairments as described below (See OT problem list), which functionally limit his ability to perform ADL/self-care tasks. Pt would benefit from skilled OT to address noted impairments and functional limitations in order to maximize safety and independence. Upon hospital discharge, recommend SNF to allow pt to gain functional independence.     Follow Up Recommendations  SNF    Equipment Recommendations       Recommendations for Other Services       Precautions / Restrictions Precautions Precautions: Fall Restrictions Weight Bearing Restrictions: No      Mobility Bed Mobility Overal bed mobility: Needs Assistance Bed Mobility: Supine to Sit;Sit to Supine     Supine to sit: Min assist Sit to supine: Min assist      Transfers Overall transfer level: Needs assistance Equipment used: Rolling walker (2 wheeled) Transfers: Sit to/from Stand Sit to Stand: Min assist              Balance Overall balance assessment: Needs assistance Sitting-balance support: Feet unsupported;Single extremity supported;No upper extremity supported Sitting balance-Leahy Scale: Fair Sitting balance - Comments: Can sit without UE support and CGA, with occassional lateral and posterior lean. Can correct when given VCs. Postural control: Posterior lean;Left lateral lean  Standing balance support: Bilateral upper extremity supported Standing balance-Leahy Scale: Fair Standing balance comment: Patient able to stand using the RW without LOB                           ADL either performed or assessed with clinical judgement   ADL Overall ADL's : Needs assistance/impaired Eating/Feeding: Minimal assistance   Grooming:  Wash/dry face;Wash/dry hands;Min guard;Set up;Cueing for sequencing;Sitting   Upper Body Bathing: Min guard;Sitting;Cueing for sequencing;Set up  Upper Body Dressing: MinA                             General ADL Comments: Pt states that he has been IND in ADLs     Vision Baseline Vision/History:  (Right eye opens only ~ half way) Patient Visual Report: Peripheral vision impairment Additional Comments: unable to identify figures in peripheral vision; difficulty with visual tracking -- potentially because cannot understand directions?     Perception     Praxis      Pertinent Vitals/Pain Pain Assessment: 0-10 Pain Score: 10-Worst pain ever Pain Location: Pt states that he hurts "all over his body," rates as a 10. Pain Intervention(s): Monitored during session;Limited activity within patient's tolerance     Hand Dominance Right   Extremity/Trunk Assessment Upper Extremity Assessment Upper Extremity Assessment: Generalized weakness RUE Deficits / Details: Generally 3+ to 4/5 for RUE strength LUE Deficits / Details: Generally 3+ to 4/5 for LUE strength   Lower Extremity Assessment Lower Extremity Assessment: Generalized weakness   Cervical / Trunk Assessment Cervical / Trunk Assessment: Kyphotic   Communication Communication Communication: Other (comment) (difficult to understand; speaks slowly, quietly, mumbles)   Cognition Arousal/Alertness: Lethargic Behavior During Therapy: Flat affect Overall Cognitive Status: No family/caregiver present to determine baseline cognitive functioning                                 General Comments: A/O x 4.   General Comments       Exercises Other Exercises Other Exercises: Educated re: role of OT, POC, falls prevention; Engaged in UB grooming, bathing, and dressing; Therex in standing with RW   Shoulder Instructions      Home Living Family/patient expects to be discharged to:: Group home Living  Arrangements: Group Home Available Help at Discharge: Family;Friend(s) Type of Home: Group Home Home Access: Stairs to enter Entrance Stairs-Number of Steps: room on 2nd floor, no elevator   Home Layout: Two level               Home Equipment: None   Additional Comments: Pt has been living in a group home with 14 residents.      Prior Functioning/Environment Level of Independence: Independent                 OT Problem List: Decreased strength;Decreased range of motion;Decreased activity tolerance;Decreased coordination;Impaired balance (sitting and/or standing);Decreased safety awareness;Pain;Decreased knowledge of use of DME or AE      OT Treatment/Interventions: Self-care/ADL training;Therapeutic exercise;Energy conservation;DME and/or AE instruction;Therapeutic activities;Balance training;Patient/family education    OT Goals(Current goals can be found in the care plan section) Acute Rehab OT Goals Patient Stated Goal: to find a place to live other than the group home where he has been living OT Goal Formulation: With patient Time For Goal Achievement: 12/21/19 Potential to Achieve Goals: Fair  OT Frequency: Min 1X/week   Barriers to D/C: Inaccessible home environment;Decreased caregiver support          Co-evaluation              AM-PAC OT "6 Clicks" Daily Activity     Outcome Measure Help from another person eating meals?: A Little Help from another person taking care of personal grooming?: A Little Help from another person toileting, which includes using toliet, bedpan, or urinal?: A Lot Help from another person bathing (including washing, rinsing, drying)?: A Lot Help from another person to put on and taking off regular upper body clothing?: A Little Help from another person to put on and taking off regular lower body clothing?: A Lot 6 Click Score: 15   End of Session Equipment Utilized During Treatment: Gait belt;Rolling walker Nurse  Communication: Other (comment) (no call bell in room)  Activity Tolerance: Patient tolerated treatment well;Patient limited by lethargy Patient left: in bed;with nursing/sitter in room;with bed alarm set  OT Visit Diagnosis: Unsteadiness on feet (R26.81);Other abnormalities of gait and mobility (R26.89);Muscle weakness (generalized) (M62.81)                Time: 9449-6759 OT Time Calculation (min): 36 min Charges:  OT General Charges $OT Visit: 1 Visit OT Evaluation $OT Eval Low Complexity: 1 Low OT Treatments $Self Care/Home Management : 8-22 mins $Therapeutic Exercise: 8-22 mins  Josiah Lobo, PhD, MS, OTR/L ascom [phone] 12/07/19, 12:54 PM

## 2019-12-07 NOTE — Progress Notes (Signed)
PROGRESS NOTE    Brady Smith  WUJ:[id number] DOB: [birth year] DOA: 12/04/2019 PCP: Glean Hess, MD  Assessment & Plan:   Principal Problem:   CAP (community acquired pneumonia) Active Problems:   COPD exacerbation ([place])   Essential hypertension   AA (alcohol abuse)   Paroxysmal atrial fibrillation ([place])   Tobacco abuse  CAP: improving. Continue on IV rocephin, azithromycin x 5 days & IV steroids. Continue on bronchodilators and encourage incentive spirometry. Legionella & strept are neg. 1 of 2 blood cxs growing staph species, likely contaminant. Repeat blood cxs are pending. Continue on supplemental oxygen and wean as tolerated  COPD exacerbation: continue on IV azithromycin for its anti-inflammatory properties. Continue on IV steroids, bronchodilators & encourage incentive spirometry.   Alcohol abuse: continue on CIWA protocol. Alcohol cessation counseling.   Acute hypoxic respiratory failure: secondary to COPD & CAP. Continue on supplemental oxygen and wean as tolerated  Hyperkalemia: still elevated but trending down. Lokelma x 1. Hold lisinopril. Will continue to monitor    Hx of hernia: ventral vs umbilical hernia. XR abd shows Markedly distended loop of bowel in the mid abdomen, unclear if this reflects a loop of large or small bowel. Several additional loops of air-filled bowel with questionable fold thickening are noted in the immediate vicinity. Findings raise concern for a bowel obstruction. CT abd/pelvis ordered.   Leukocytosis: infection vs steroid use. Will continue to monitor   HTN: holding home dose of lisinopril secondary to hyperkalemia. IV hydralazine prn   Tobacco abuse: continue on nicotine patch to prevent w/drawal. Smoking cessation counseling  PAF:  Not on anticoagulation for unknown reason, possible fall risk.    DVT prophylaxis: lovenox  Code Status: full  Family Communication: Disposition Plan: likely d/c to SNF    Status is:  Inpatient  Remains inpatient appropriate because:IV treatments appropriate due to intensity of illness or inability to take PO, still scoring on CIWA. CT abd/pelvis ordered to r/o obstruction.    Dispo: The patient is from: Home              Anticipated d/c is to: SNF              Anticipated d/c date is: 3 days              Patient currently is not medically stable to d/c.   Consultants:     Procedures:    Antimicrobials: rocephin, azithromycin    Subjective: Pt is lethargic and would not answer my questions today.  Objective: Vitals:   12/06/19 2037 12/07/19 0237 12/07/19 0349 12/07/19 0803  BP:   (!) 145/83   Pulse:   (!) 106   Resp:   20   Temp:   98 F (36.7 C)   TempSrc:   Oral   SpO2: 93% (!) 85% 91% 92%  Weight:      Height:        Intake/Output Summary (Last 24 hours) at 12/07/2019 0829 Last data filed at 12/07/2019 0600 Gross per 24 hour  Intake 1220.06 ml  Output 450 ml  Net 770.06 ml   Filed Weights   12/04/19 0621  Weight: 68 kg    Examination: General exam: Appears lethargic Respiratory system: decreased breath sounds b/l. No rales Cardiovascular system: S1 & S2 +. No rubs or gallops Gastrointestinal system: Abdomen is nondistended, soft and nontender. Hypoactive bowel sounds heard. Central nervous system: Alert and oriented. Moves all 4 extremities . Psychiatry: Judgement and insight appear abnormal. Flat  mood and affect      Data Reviewed: I have personally reviewed following labs and imaging studies  CBC: Recent Labs  Lab 12/04/19 0625 12/05/19 0631 12/06/19 0709 12/07/19 0617  WBC 9.5 15.5* 19.1* 21.3*  HGB 15.4 13.5 15.1 16.3  HCT 44.7 39.8 44.2 49.8  MCV 93.5 94.5 93.8 97.1  PLT 182 204 264 700   Basic Metabolic Panel: Recent Labs  Lab 12/04/19 0625 12/05/19 0631 12/06/19 0709 12/06/19 1505 12/07/19 0617  NA 132* 135 137  --  139  K 4.4 4.9 5.2* 5.6* 5.3*  CL 94* 102 101  --  98  CO2 26 24 28   --  31  GLUCOSE  131* 165* 129*  --  175*  BUN 8 23 26*  --  44*  CREATININE 0.86 0.97 0.76  --  1.04  CALCIUM 9.1 9.2 9.4  --  10.0   GFR: Estimated Creatinine Clearance: 58.3 mL/min (by C-G formula based on SCr of 1.04 mg/dL). Liver Function Tests: No results for input(s): AST, ALT, ALKPHOS, BILITOT, PROT, ALBUMIN in the last 168 hours. No results for input(s): LIPASE, AMYLASE in the last 168 hours. No results for input(s): AMMONIA in the last 168 hours. Coagulation Profile: No results for input(s): INR, PROTIME in the last 168 hours. Cardiac Enzymes: No results for input(s): CKTOTAL, CKMB, CKMBINDEX, TROPONINI in the last 168 hours. BNP (last 3 results) No results for input(s): PROBNP in the last 8760 hours. HbA1C: No results for input(s): HGBA1C in the last 72 hours. CBG: No results for input(s): GLUCAP in the last 168 hours. Lipid Profile: No results for input(s): CHOL, HDL, LDLCALC, TRIG, CHOLHDL, LDLDIRECT in the last 72 hours. Thyroid Function Tests: No results for input(s): TSH, T4TOTAL, FREET4, T3FREE, THYROIDAB in the last 72 hours. Anemia Panel: No results for input(s): VITAMINB12, FOLATE, FERRITIN, TIBC, IRON, RETICCTPCT in the last 72 hours. Sepsis Labs: Recent Labs  Lab 12/04/19 0854  PROCALCITON <0.10    Recent Results (from the past 240 hour(s))  SARS Coronavirus 2 by RT PCR (hospital order, performed in [facility] hospital lab) Nasopharyngeal Nasopharyngeal Swab     Status: None   Collection Time: 12/04/19  6:25 AM   Specimen: Nasopharyngeal Swab  Result Value Ref Range Status   SARS Coronavirus 2 NEGATIVE NEGATIVE Final    Comment: (NOTE) SARS-CoV-2 target nucleic acids are NOT DETECTED.  The SARS-CoV-2 RNA is generally detectable in upper and lower respiratory specimens during the acute phase of infection. The lowest concentration of SARS-CoV-2 viral copies this assay can detect is 250 copies / mL. A negative result does not preclude SARS-CoV-2 infection and should  not be used as the sole basis for treatment or other patient management decisions.  A negative result may occur with improper specimen collection / handling, submission of specimen other than nasopharyngeal swab, presence of viral mutation(s) within the areas targeted by this assay, and inadequate number of viral copies (<250 copies / mL). A negative result must be combined with clinical observations, patient history, and epidemiological information.  Fact Sheet for Patients:   StrictlyIdeas.no  Fact Sheet for Healthcare Providers: BankingDealers.co.za  This test is not yet approved or  cleared by the [place] FDA and has been authorized for detection and/or diagnosis of SARS-CoV-2 by FDA under an Emergency Use Authorization (EUA).  This EUA will remain in effect (meaning this test can be used) for the duration of the COVID-19 declaration under Section 564(b)(1) of the Act, 21 U.S.C. section 360bbb-3(b)(1),  unless the authorization is terminated or revoked sooner.  Performed at [facility], [address] [place], [place] [address]   CULTURE, BLOOD (ROUTINE X 2) w Reflex to ID Panel     Status: Abnormal   Collection Time: 12/04/19 12:35 PM   Specimen: BLOOD  Result Value Ref Range Status   Specimen Description   Final    BLOOD RIGHT [facility] Performed at [facility], [address] [place], [place] [address]    Special Requests   Final    BOTTLES DRAWN AEROBIC AND ANAEROBIC Blood Culture adequate volume Performed at [facility], [address] [place], [place] [address]    Culture  Setup Time   Final    Organism ID to follow [place] ONLY CRITICAL RESULT CALLED TO, READ BACK BY AND VERIFIED WITH: DAVID BESANTI AT 6160 12/05/19 [place] Performed at [facility], [address] [place], [place] [address]    Culture (A)  Final    STAPHYLOCOCCUS HOMINIS THE  SIGNIFICANCE OF ISOLATING THIS ORGANISM FROM A SINGLE SET OF BLOOD CULTURES WHEN MULTIPLE SETS ARE DRAWN IS UNCERTAIN. PLEASE NOTIFY THE MICROBIOLOGY DEPARTMENT WITHIN ONE WEEK IF SPECIATION AND SENSITIVITIES ARE REQUIRED. Performed at [facility], [address] [place], [place] [address]    Report Status 12/06/2019 FINAL  Final  CULTURE, BLOOD (ROUTINE X 2) w Reflex to ID Panel     Status: None (Preliminary result)   Collection Time: 12/04/19 12:35 PM   Specimen: BLOOD  Result Value Ref Range Status   Specimen Description BLOOD RIGHT FORE ARM  Final   Special Requests   Final    BOTTLES DRAWN AEROBIC AND ANAEROBIC Blood Culture adequate volume   Culture   Final    NO GROWTH 3 DAYS Performed at [facility], [address] [place],  [address]    Report Status PENDING  Incomplete  Blood Culture ID Panel (Reflexed)     Status: Abnormal   Collection Time: 12/04/19 12:35 PM  Result Value Ref Range Status   Enterococcus faecalis NOT DETECTED NOT DETECTED Final   Enterococcus Faecium NOT DETECTED NOT DETECTED Final   Listeria monocytogenes NOT DETECTED NOT DETECTED Final   Staphylococcus species DETECTED (A) NOT DETECTED Final    Comment: CRITICAL RESULT CALLED TO, READ BACK BY AND VERIFIED WITH:  DAVID BESANTI AT 0607 12/05/19 [place]    Staphylococcus aureus (BCID) NOT DETECTED NOT DETECTED Final   Staphylococcus epidermidis NOT DETECTED NOT DETECTED Final   Staphylococcus lugdunensis NOT DETECTED NOT DETECTED Final   Streptococcus species NOT DETECTED NOT DETECTED Final   Streptococcus agalactiae NOT DETECTED NOT DETECTED Final   Streptococcus pneumoniae NOT DETECTED NOT DETECTED Final   Streptococcus pyogenes NOT DETECTED NOT DETECTED Final   A.calcoaceticus-baumannii NOT DETECTED NOT DETECTED Final   Bacteroides fragilis NOT DETECTED NOT DETECTED Final   Enterobacterales NOT DETECTED NOT DETECTED Final   Enterobacter cloacae complex NOT DETECTED NOT DETECTED  Final   Escherichia coli NOT DETECTED NOT DETECTED Final   Klebsiella aerogenes NOT DETECTED NOT DETECTED Final   Klebsiella oxytoca NOT DETECTED NOT DETECTED Final   Klebsiella pneumoniae NOT DETECTED NOT DETECTED Final   Proteus species NOT DETECTED NOT DETECTED Final   Salmonella species NOT DETECTED NOT DETECTED Final   Serratia marcescens NOT DETECTED NOT DETECTED Final   Haemophilus influenzae NOT DETECTED NOT DETECTED Final   Neisseria meningitidis NOT DETECTED NOT DETECTED Final   Pseudomonas aeruginosa NOT  DETECTED NOT DETECTED Final   Stenotrophomonas maltophilia NOT DETECTED NOT DETECTED Final   Candida albicans NOT DETECTED NOT DETECTED Final   Candida auris NOT DETECTED NOT DETECTED Final   Candida glabrata NOT DETECTED NOT DETECTED Final   Candida krusei NOT DETECTED NOT DETECTED Final   Candida parapsilosis NOT DETECTED NOT DETECTED Final   Candida tropicalis NOT DETECTED NOT DETECTED Final   Cryptococcus neoformans/gattii NOT DETECTED NOT DETECTED Final    Comment: Performed at [facility], [address] [place], [place] [address]  Culture, sputum-assessment     Status: None   Collection Time: 12/05/19 10:05 AM   Specimen: Sputum  Result Value Ref Range Status   Specimen Description SPUTUM  Final   Special Requests NONE  Final   Sputum evaluation   Final    Sputum specimen not acceptable for testing.  Please recollect.   Performed at [facility], [address] [place], [place] [address]    Report Status 12/05/2019 FINAL  Final         Radiology Studies: DG Abd Portable 1V  Result Date: 12/06/2019 CLINICAL DATA:  Cough, shortness of breath EXAM: PORTABLE ABDOMEN - 1 VIEW COMPARISON:  Radiograph 05/20/2015 FINDINGS: Markedly distended loop of bowel seen in the mid abdomen, unclear if this reflects a loop of large or small bowel. Several additional loops of air-filled bowel with questionable fold thickening are noted in the immediate  vicinity. Air and stool projects over the rectal vault. Moderate proximal colonic stool burden is noted. Cholecystectomy clips in the right upper quadrant. Vascular stenting, calcium and additional surgical clips throughout the pelvis. Degenerative changes in the spine, hips and pelvis. IMPRESSION: 1. Markedly distended loop of bowel in the mid abdomen, unclear if this reflects a loop of large or small bowel. Several additional loops of air-filled bowel with questionable fold thickening are noted in the immediate vicinity. Findings raise concern for a bowel obstruction. Consider further evaluation with CT. Electronically Signed   By: Lovena Le M.D.   On: 12/06/2019 16:01        Scheduled Meds: . aspirin EC  81 mg Oral QODAY  . docusate sodium  200 mg Oral BID  . enoxaparin (LOVENOX) injection  40 mg Subcutaneous Q24H  . folic acid  1 mg Oral Daily  . gabapentin  100 mg Oral QHS  . ipratropium-albuterol  3 mL Nebulization Q6H  . LORazepam  0-4 mg Intravenous Q12H  . methylPREDNISolone (SOLU-MEDROL) injection  40 mg Intravenous Q12H  . multivitamin with minerals  1 tablet Oral Daily  . nicotine  21 mg Transdermal Daily  . sodium zirconium cyclosilicate  10 g Oral Once  . thiamine  100 mg Oral Daily   Or  . thiamine  100 mg Intravenous Daily  . triamcinolone ointment  1 application Topical BID   Continuous Infusions: . sodium chloride 10 mL/hr at 12/06/19 1144  . azithromycin Stopped (12/06/19 1418)  . cefTRIAXone (ROCEPHIN)  IV Stopped (12/06/19 1214)     LOS: 2 days    Time spent: 31 mins     Wyvonnia Dusky, MD [facility] Pager [phone]  If 7PM-7AM, please contact night-coverage [email or web address] 12/07/2019, 8:29 AM

## 2019-12-08 DIAGNOSIS — R0602 Shortness of breath: Secondary | ICD-10-CM

## 2019-12-08 DIAGNOSIS — K439 Ventral hernia without obstruction or gangrene: Secondary | ICD-10-CM

## 2019-12-08 DIAGNOSIS — Z720 Tobacco use: Secondary | ICD-10-CM

## 2019-12-08 LAB — BASIC METABOLIC PANEL
Anion gap: 12 (ref 5–15)
BUN: 43 mg/dL — ABNORMAL HIGH (ref 8–23)
CO2: 24 mmol/L (ref 22–32)
Calcium: 9.6 mg/dL (ref 8.9–10.3)
Chloride: 104 mmol/L (ref 98–111)
Creatinine, Ser: 0.93 mg/dL (ref 0.61–1.24)
GFR calc Af Amer: >60 mL/min (ref >60)
GFR calc non Af Amer: >60 mL/min (ref >60)
Glucose, Bld: 149 mg/dL — ABNORMAL HIGH (ref 70–99)
Potassium: 4.9 mmol/L (ref 3.5–5.1)
Sodium: 140 mmol/L (ref 135–145)

## 2019-12-08 LAB — CBC
HCT: 51 % (ref 39.0–52.0)
Hemoglobin: 17 g/dL (ref 13.0–17.0)
MCH: 32.4 pg (ref 26.0–34.0)
MCHC: 33.3 g/dL (ref 30.0–36.0)
MCV: 97.3 fL (ref 80.0–100.0)
Platelets: 215 10*3/uL (ref 150–400)
RBC: 5.24 MIL/uL (ref 4.22–5.81)
RDW: 13 % (ref 11.5–15.5)
WBC: 21.3 10*3/uL — ABNORMAL HIGH (ref 4.0–10.5)
nRBC: 0 % (ref 0.0–0.2)

## 2019-12-08 LAB — CBC WITH DIFFERENTIAL/PLATELET
Abs Immature Granulocytes: 0.1 10*3/uL — ABNORMAL HIGH (ref 0.00–0.07)
Basophils Absolute: 0.1 10*3/uL (ref 0.0–0.1)
Basophils Relative: 0 %
Eosinophils Absolute: 0.1 10*3/uL (ref 0.0–0.5)
Eosinophils Relative: 1 %
HCT: 50.1 % (ref 39.0–52.0)
Hemoglobin: 16.7 g/dL (ref 13.0–17.0)
Immature Granulocytes: 1 %
Lymphocytes Relative: 1 %
Lymphs Abs: 0.3 10*3/uL — ABNORMAL LOW (ref 0.7–4.0)
MCH: 31.9 pg (ref 26.0–34.0)
MCHC: 33.3 g/dL (ref 30.0–36.0)
MCV: 95.8 fL (ref 80.0–100.0)
Monocytes Absolute: 0.9 10*3/uL (ref 0.1–1.0)
Monocytes Relative: 4 %
Neutro Abs: 19.1 10*3/uL — ABNORMAL HIGH (ref 1.7–7.7)
Neutrophils Relative %: 93 %
Platelets: 206 10*3/uL (ref 150–400)
RBC: 5.23 MIL/uL (ref 4.22–5.81)
RDW: 13.1 % (ref 11.5–15.5)
WBC: 20.6 10*3/uL — ABNORMAL HIGH (ref 4.0–10.5)
nRBC: 0 % (ref 0.0–0.2)

## 2019-12-08 LAB — MAGNESIUM: Magnesium: 2.5 mg/dL — ABNORMAL HIGH (ref 1.7–2.4)

## 2019-12-08 LAB — LACTIC ACID, PLASMA
Lactic Acid, Venous: 1.9 mmol/L (ref 0.5–1.9)
Lactic Acid, Venous: 1.7 mmol/L (ref 0.5–1.9)

## 2019-12-08 LAB — PROCALCITONIN: Procalcitonin: 0.1 ng/mL

## 2019-12-08 MED ORDER — VANCOMYCIN HCL 1500 MG/300ML IV SOLN
1500.0000 mg | Freq: Once | INTRAVENOUS | Status: AC
  Administered 2019-12-08: 1500 mg via INTRAVENOUS
  Filled 2019-12-08: qty 300

## 2019-12-08 MED ORDER — SODIUM CHLORIDE 0.9 % IV SOLN
2.0000 g | INTRAVENOUS | Status: DC
  Administered 2019-12-08 – 2019-12-11 (×4): 2 g via INTRAVENOUS
  Filled 2019-12-08 (×2): qty 2
  Filled 2019-12-08 (×3): qty 20

## 2019-12-08 MED ORDER — VANCOMYCIN HCL 750 MG/150ML IV SOLN
750.0000 mg | Freq: Two times a day (BID) | INTRAVENOUS | Status: AC
  Administered 2019-12-09 – 2019-12-10 (×3): 750 mg via INTRAVENOUS
  Filled 2019-12-08 (×3): qty 150

## 2019-12-08 NOTE — Consult Note (Signed)
Pharmacy Antibiotic Note  Brady Smith is a 74 y.o. male admitted on 12/04/2019 with pneumonia.  Pharmacy has been consulted for vancomycin dosing following persistent leukocytosis on antibiotics. Since admission his renal function has been stable and at his apparent baseline  Plan:   Start vancomycin 1500 mg IV x 1 then  750 mg IV every 12 hours  T1/2: 11.9 h, Ke: 0.058 h-1  Css (calculated): 30.4/16.0 mcg/mL  Daily SCr while on vancomycin to assess renal function  vancomycin levels as clinically indicated  Height: 5\' 7"  (170.2 cm) Weight: 68 kg (150 lb) IBW/kg (Calculated) : 66.1  Temp (24hrs), Avg:97.7 F (36.5 C), Min:97.5 F (36.4 C), Max:98 F (36.7 C)  Recent Labs  Lab 12/04/19 0625 12/05/19 0631 12/06/19 0709 12/07/19 0617 12/08/19 0548  WBC 9.5 15.5* 19.1* 21.3* 21.3*  CREATININE 0.86 0.97 0.76 1.04 0.93    Estimated Creatinine Clearance: 65.2 mL/min (by C-G formula based on SCr of 0.93 mg/dL).    Antimicrobials this admission:  09/11 azithromycin >> 9/15 09/11 ceftriaxone >>  09/15 vancomycin >>   Microbiology results: 9/14 BCx: NG < 24h 9/11 BCx: 1/4 S hominis 9/12 Sputum: NG final 9/11 SARS CoV-2: negative   Thank you for allowing pharmacy to be a part of this patient's care.  Dallie Piles 12/08/2019 8:47 AM

## 2019-12-08 NOTE — Progress Notes (Signed)
OT Cancellation Note  Patient Details Name: Brady Smith MRN: [id number] DOB: September 03, 1945   Cancelled Treatment:    Reason Eval/Treat Not Completed: Fatigue/lethargy limiting ability to participate. According to nurse, pt was recently found on the floor. Pt's bed had been changed to a very low bed with mats on floor. Pt is in new bed, snoring, unable to be aroused. Will attempt therapy another date, as pt is awake and medically cleared.  Josiah Lobo, PhD, [place], OTR/L ascom [phone] 12/08/19, 2:57 PM

## 2019-12-08 NOTE — Care Management Important Message (Signed)
Important Message  Patient Details  Name: Brady Smith MRN: [id number] Date of Birth: 09/14/1945   Medicare Important Message Given:  Yes  Initial Medicare IM given by Patient Access Associate on 12/08/2019 at 10:59am.   Dannette Barbara 12/08/2019, 11:29 AM

## 2019-12-08 NOTE — Progress Notes (Signed)
PROGRESS NOTE    Brady Smith  TDD:[id number] DOB: 15-Apr-1945 DOA: 12/04/2019 PCP: Glean Hess, MD   Subjective: The patient was seen and examined this morning, lethargic, confused.  Nursing staff present at bedside reporting morphine was given due to abdominal pain.  Otherwise hemodynamically stable. He has been kept n.p.o. yesterday due to finding of CT scan consistent with small bowel obstruction He has not had any nausea or vomiting therefore NG tube was not inserted He has been n.p.o. since yesterday.  Hospital course:  Brady Smith is a 74 y.o. male with medical history significant of COPD, hypertension, hyperlipidemia, GERD, alcohol abuse, tobacco abuse, PAF not on anticoagulants, GERD, depression, anxiety, bladder cancer, PVD, who presents with cough and shortness of breath... Patient was subsequently electrocuted respiratory failure due to pneumonia, COPD exacerbation  Subsequently during hospitalization the evening of 12/07/2019 developed some abdominal discomfort, imaging, CT scan was consistent with small bowel obstruction-patient was kept n.p.o., IV fluid bolus reinitiated  He received 2 dose of Covid vaccine.  SARS-CoV-2-negative on this admission Satting 94% on 2 L of oxygen (patient is not O2 dependent at home) chest x-ray showed patchy infiltration in right lower lobe.     Assessment & Plan:   Principal Problem:   CAP (community acquired pneumonia) Active Problems:   COPD exacerbation ([place])   Essential hypertension   AA (alcohol abuse)   Paroxysmal atrial fibrillation ([facility])   Tobacco abuse   Small bowel obstruction -On exam hypoactive bowel sounds noted, no BM x24 hours -Patient and nursing staff reporting no nausea or vomiting -We will holding NG tube insertion -He has been kept n.p.o. since 12/07/2019, CT scan consistent with small bowel obstruction -N.p.o. -IV fluid, continue conservative management If no improvement no progressively worsened  will consult general surgery  Hx of hernia: ventral vs umbilical hernia. XR abd shows Markedly distended loop of bowel in the mid abdomen, unclear if this reflects a loop of large or small bowel. Several additional loops of air-filled bowel with questionable fold thickening are noted in the immediate vicinity. Findings raise concern for a bowel obstruction. CT abd/pelvis was reviewed finding consistent with possible small bowel obstruction  CAP: Improving remains on 3 L of oxygen now, satting 91%  continue on IV rocephin, azithromycin x 5 days & IV steroids. Continue on bronchodilators and encourage incentive spirometry. Legionella & strept are neg. 1 of 2 blood cxs growing staph species, likely contaminant.  Repeat blood cxs are pending. Continue on supplemental oxygen and wean as tolerated Ruling out bacteremia/sepsis  This patient has persistent leukocytosis, WBC 21.3, 20.6, procalcitonin 0.10, lactic acid 1.9, -We will continue to monitor procalcitonin lactic acid level As reviewed blood culture pending, will add empiric vancomycin  Acute respiratory failure likely due to COPD exacerbation/CAP COPD exacerbation:  -Not O2 dependent at home,  -Still requiring 3 L of oxygen for maintain O2 sat greater than 90%, currently 91% -Continue current antibiotic azithromycin, IV steroids with tapering down -Continue bronchodilators, incentive spirometer    Alcohol abuse: No signs of withdrawal, continue on CIWA protocol. Alcohol cessation counseling.    Hyperkalemia:  Potassium 5.3, 4.9 today still elevated but trending down. Lokelma x 1. Hold lisinopril. Will continue to monitor     Acute toxic metabolic encephalopathy -Likely due to infection, narcotics -We will continue monitor very closely -N.p.o. for now, aspiration precaution We will continue treating underlying cause as above   HTN: Stable,holding home dose of lisinopril secondary to hyperkalemia. IV hydralazine prn  Tobacco  abuse: continue on nicotine patch to prevent w/drawal. Smoking cessation counseling  PAF:  Not on anticoagulation for unknown reason, possible fall risk.    DVT prophylaxis: lovenox  Code Status: full  Family Communication: Disposition Plan: likely d/c to SNF    Status is: Inpatient  Remains inpatient appropriate because:IV treatments appropriate due to intensity of illness or inability to take PO, still scoring on CIWA. CT abd/pelvis ordered to r/o obstruction.    Dispo: The patient is from: Home              Anticipated d/c is to: SNF              Anticipated d/c date is: 3 days              Patient currently is not medically stable to d/c.   Consultants:     Procedures:    Antimicrobials: rocephin, azithromycin      Objective: Vitals:   12/07/19 2144 12/08/19 0205 12/08/19 0341 12/08/19 0843  BP: 117/80  (!) 124/59   Pulse: 99  99   Resp: (!) 21  20   Temp: 98 F (36.7 C)  (!) 97.5 F (36.4 C)   TempSrc: Oral  Oral   SpO2: 97% 95% 91% 92%  Weight:      Height:        Intake/Output Summary (Last 24 hours) at 12/08/2019 1242 Last data filed at 12/08/2019 0311 Gross per 24 hour  Intake 951.37 ml  Output --  Net 951.37 ml   Filed Weights   12/04/19 0621  Weight: 68 kg      Physical Exam:   General:  Alert, oriented, cooperative, no distress; mildly lethargic, confused  HEENT:  Normocephalic, PERRL, otherwise with in Normal limits   Neuro:  CNII-XII intact. , normal motor and sensation, reflexes intact   Lungs:   Clear to auscultation BL, Respirations unlabored, no wheezes / crackles  Cardio:    S1/S2, RRR, No murmure, No Rubs or Gallops   Abdomen:    Soft, diffuse tenderness, hypoactive bowel sounds,  no guarding or peritoneal signs.  Small reducible ventral abdominal hernia x2  Muscular skeletal:  Limited exam - in bed, able to move all 4 extremities, Normal strength,  2+ pulses,  symmetric, No pitting edema  Skin:  Dry, warm to touch, negative  for any Rashes, No open wounds  Wounds: Please see nursing documentation          Data Reviewed: I have personally reviewed following labs and imaging studies  CBC: Recent Labs  Lab 12/05/19 0631 12/06/19 0709 12/07/19 0617 12/08/19 0548 12/08/19 0915  WBC 15.5* 19.1* 21.3* 21.3* 20.6*  NEUTROABS  --   --   --   --  19.1*  HGB 13.5 15.1 16.3 17.0 16.7  HCT 39.8 44.2 49.8 51.0 50.1  MCV 94.5 93.8 97.1 97.3 95.8  PLT 204 264 279 215 341   Basic Metabolic Panel: Recent Labs  Lab 12/04/19 0625 12/04/19 0625 12/05/19 0631 12/06/19 0709 12/06/19 1505 12/07/19 0617 12/08/19 0548  NA 132*  --  135 137  --  139 140  K 4.4   < > 4.9 5.2* 5.6* 5.3* 4.9  CL 94*  --  102 101  --  98 104  CO2 26  --  24 28  --  31 24  GLUCOSE 131*  --  165* 129*  --  175* 149*  BUN 8  --  23 26*  --  44* 43*  CREATININE 0.86  --  0.97 0.76  --  1.04 0.93  CALCIUM 9.1  --  9.2 9.4  --  10.0 9.6   < > = values in this interval not displayed.   GFR: Estimated Creatinine Clearance: 65.2 mL/min (by C-G formula based on SCr of 0.93 mg/dL). Liver Function Tests: No results for input(s): AST, ALT, ALKPHOS, BILITOT, PROT, ALBUMIN in the last 168 hours. No results for input(s): LIPASE, AMYLASE in the last 168 hours. No results for input(s): AMMONIA in the last 168 hours. Coagulation Profile: No results for input(s): INR, PROTIME in the last 168 hours. Cardiac Enzymes: No results for input(s): CKTOTAL, CKMB, CKMBINDEX, TROPONINI in the last 168 hours. BNP (last 3 results) No results for input(s): PROBNP in the last 8760 hours. HbA1C: No results for input(s): HGBA1C in the last 72 hours. CBG: No results for input(s): GLUCAP in the last 168 hours. Lipid Profile: No results for input(s): CHOL, HDL, LDLCALC, TRIG, CHOLHDL, LDLDIRECT in the last 72 hours. Thyroid Function Tests: No results for input(s): TSH, T4TOTAL, FREET4, T3FREE, THYROIDAB in the last 72 hours. Anemia Panel: No results for  input(s): VITAMINB12, FOLATE, FERRITIN, TIBC, IRON, RETICCTPCT in the last 72 hours. Sepsis Labs: Recent Labs  Lab 12/04/19 0854 12/08/19 0915  PROCALCITON <0.10 0.10  LATICACIDVEN  --  1.9    Recent Results (from the past 240 hour(s))  SARS Coronavirus 2 by RT PCR (hospital order, performed in [facility] hospital lab) Nasopharyngeal Nasopharyngeal Swab     Status: None   Collection Time: 12/04/19  6:25 AM   Specimen: Nasopharyngeal Swab  Result Value Ref Range Status   SARS Coronavirus 2 NEGATIVE NEGATIVE Final    Comment: (NOTE) SARS-CoV-2 target nucleic acids are NOT DETECTED.  The SARS-CoV-2 RNA is generally detectable in upper and lower respiratory specimens during the acute phase of infection. The lowest concentration of SARS-CoV-2 viral copies this assay can detect is 250 copies / mL. A negative result does not preclude SARS-CoV-2 infection and should not be used as the sole basis for treatment or other patient management decisions.  A negative result may occur with improper specimen collection / handling, submission of specimen other than nasopharyngeal swab, presence of viral mutation(s) within the areas targeted by this assay, and inadequate number of viral copies (<250 copies / mL). A negative result must be combined with clinical observations, patient history, and epidemiological information.  Fact Sheet for Patients:   StrictlyIdeas.no  Fact Sheet for Healthcare Providers: BankingDealers.co.za  This test is not yet approved or  cleared by the [place] FDA and has been authorized for detection and/or diagnosis of SARS-CoV-2 by FDA under an Emergency Use Authorization (EUA).  This EUA will remain in effect (meaning this test can be used) for the duration of the COVID-19 declaration under Section 564(b)(1) of the Act, 21 U.S.C. section 360bbb-3(b)(1), unless the authorization is terminated or revoked  sooner.  Performed at [facility], [address] [place], [place] [address]   CULTURE, BLOOD (ROUTINE X 2) w Reflex to ID Panel     Status: Abnormal   Collection Time: 12/04/19 12:35 PM   Specimen: BLOOD  Result Value Ref Range Status   Specimen Description   Final    BLOOD RIGHT [facility] Performed at [facility], [address], [place], [place] [address]    Special Requests   Final    BOTTLES DRAWN AEROBIC AND ANAEROBIC Blood Culture adequate volume Performed at [facility], [address] [place], [place] [address]    Culture  Setup Time   Final    Organism ID to follow [facility] POSITIVE COCCI AEROBIC BOTTLE ONLY CRITICAL RESULT CALLED TO, READ BACK BY AND VERIFIED WITH: DAVID BESANTI AT 4496 12/05/19 SDR Performed at [facility], [address] [place], [place] [address]    Culture (A)  Final    STAPHYLOCOCCUS HOMINIS THE SIGNIFICANCE OF ISOLATING THIS ORGANISM FROM A SINGLE SET OF BLOOD CULTURES WHEN MULTIPLE SETS ARE DRAWN IS UNCERTAIN. PLEASE NOTIFY THE MICROBIOLOGY DEPARTMENT WITHIN ONE WEEK IF SPECIATION AND SENSITIVITIES ARE REQUIRED. Performed at [facility], [place] [address] [place], [place] [address]    Report Status 12/06/2019 FINAL  Final  CULTURE, BLOOD (ROUTINE X 2) w Reflex to ID Panel     Status: None (Preliminary result)   Collection Time: 12/04/19 12:35 PM   Specimen: BLOOD  Result Value Ref Range Status   Specimen Description BLOOD RIGHT FORE ARM  Final   Special Requests   Final    BOTTLES DRAWN AEROBIC AND ANAEROBIC Blood Culture adequate volume   Culture   Final    NO GROWTH 4 DAYS Performed at [facility], [address] [place], [place] [address]    Report Status PENDING  Incomplete  Blood Culture ID Panel (Reflexed)     Status: Abnormal   Collection Time: 12/04/19 12:35 PM  Result Value Ref Range Status   Enterococcus faecalis NOT DETECTED NOT DETECTED Final   Enterococcus Faecium NOT  DETECTED NOT DETECTED Final   Listeria monocytogenes NOT DETECTED NOT DETECTED Final   Staphylococcus species DETECTED (A) NOT DETECTED Final    Comment: CRITICAL RESULT CALLED TO, READ BACK BY AND VERIFIED WITH:  DAVID BESANTI AT 0607 12/05/19 SDR    Staphylococcus aureus (BCID) NOT DETECTED NOT DETECTED Final   Staphylococcus epidermidis NOT DETECTED NOT DETECTED Final   Staphylococcus lugdunensis NOT DETECTED NOT DETECTED Final   Streptococcus species NOT DETECTED NOT DETECTED Final   Streptococcus agalactiae NOT DETECTED NOT DETECTED Final   Streptococcus pneumoniae NOT DETECTED NOT DETECTED Final   Streptococcus pyogenes NOT DETECTED NOT DETECTED Final   A.calcoaceticus-baumannii NOT DETECTED NOT DETECTED Final   Bacteroides fragilis NOT DETECTED NOT DETECTED Final   Enterobacterales NOT DETECTED NOT DETECTED Final   Enterobacter cloacae complex NOT DETECTED NOT DETECTED Final   Escherichia coli NOT DETECTED NOT DETECTED Final   Klebsiella aerogenes NOT DETECTED NOT DETECTED Final   Klebsiella oxytoca NOT DETECTED NOT DETECTED Final   Klebsiella pneumoniae NOT DETECTED NOT DETECTED Final   Proteus species NOT DETECTED NOT DETECTED Final   Salmonella species NOT DETECTED NOT DETECTED Final   Serratia marcescens NOT DETECTED NOT DETECTED Final   Haemophilus influenzae NOT DETECTED NOT DETECTED Final   Neisseria meningitidis NOT DETECTED NOT DETECTED Final   Pseudomonas aeruginosa NOT DETECTED NOT DETECTED Final   Stenotrophomonas maltophilia NOT DETECTED NOT DETECTED Final   Candida albicans NOT DETECTED NOT DETECTED Final   Candida auris NOT DETECTED NOT DETECTED Final   Candida glabrata NOT DETECTED NOT DETECTED Final   Candida krusei NOT DETECTED NOT DETECTED Final   Candida parapsilosis NOT DETECTED NOT DETECTED Final   Candida tropicalis NOT DETECTED NOT DETECTED Final   Cryptococcus neoformans/gattii NOT DETECTED NOT DETECTED Final    Comment: Performed at [facility], [address] [place],  [address]  Culture, sputum-assessment     Status: None   Collection  Time: 12/05/19 10:05 AM   Specimen: Sputum  Result Value Ref Range Status   Specimen Description SPUTUM  Final   Special Requests NONE  Final   Sputum evaluation   Final    Sputum specimen not acceptable for testing.  Please recollect.   Performed at [facility], [address] [place], [place] [address]    Report Status 12/05/2019 FINAL  Final  CULTURE, BLOOD (ROUTINE X 2) w Reflex to ID Panel     Status: None (Preliminary result)   Collection Time: 12/07/19  8:40 AM   Specimen: BLOOD  Result Value Ref Range Status   Specimen Description BLOOD LEFT ANTECUBITAL  Final   Special Requests   Final    BOTTLES DRAWN AEROBIC AND ANAEROBIC Blood Culture adequate volume   Culture   Final    NO GROWTH < 24 HOURS Performed at [facility], [address] [place], [place] [address]    Report Status PENDING  Incomplete  CULTURE, BLOOD (ROUTINE X 2) w Reflex to ID Panel     Status: None (Preliminary result)   Collection Time: 12/07/19  8:48 AM   Specimen: BLOOD  Result Value Ref Range Status   Specimen Description BLOOD BLOOD RIGHT HAND  Final   Special Requests   Final    BOTTLES DRAWN AEROBIC AND ANAEROBIC Blood Culture adequate volume   Culture   Final    NO GROWTH < 24 HOURS Performed at [facility], [address], [place], [place] [address]    Report Status PENDING  Incomplete         Radiology Studies: CT ABDOMEN PELVIS WO CONTRAST  Result Date: 12/07/2019 CLINICAL DATA:  Abdominal pain EXAM: CT ABDOMEN AND PELVIS WITHOUT CONTRAST TECHNIQUE: Multidetector CT imaging of the abdomen and pelvis was performed following the standard protocol without IV contrast. Oral contrast was administered. COMPARISON:  Abdominal radiographs December 06, 2019; CT abdomen and pelvis May 27, 2015 FINDINGS: Lower chest: There is consolidation in the right  base. There is also a small area of consolidation in the inferior, lateral aspect of the right middle lobe. There are foci of coronary artery calcification. Hepatobiliary: No focal liver lesions are evident. Gallbladder is absent. There is no appreciable biliary duct dilatation. Pancreas: There is no pancreatic mass or inflammatory focus. Spleen: No splenic lesions are evident. Adrenals/Urinary Tract: Adrenals bilaterally appear normal. There is no renal mass or hydronephrosis on either side. There is an extrarenal pelvis on the right, an anatomic variant. There is a 1 mm calculus in the mid right kidney. There is no ureteral calculus on either side. Urinary bladder is midline with wall thickness within normal limits. Stomach/Bowel: Stomach is filled with fluid. There is small bowel dilatation. There is evidence of there is a focal loop of markedly dilated small bowel in the anterior mid abdomen which contains oral contrast. Immediately distal to this localized marked bowel dilatation, there is a transition zone indicating site of small-bowel obstruction. There is contrast which passes through this area. There is no appreciable distal small bowel obstruction. The terminal ileum appears normal. There is fatty infiltration in the ileocecal valve. There is no appreciable colonic dilatation. There is moderate stool in the colon. There is no appreciable free air or portal venous air. Vascular/Lymphatic: There is no abdominal aortic aneurysm. There is localized dilatation of the distal abdominal aorta measuring 3.6 x 3.2 cm with localized saccular dilatation in this area, also present on previous study with slightly greater  dilatation compared to the previous study. There is extensive aortic and iliac artery atherosclerosis. Multiple more distal pelvic arterial calcifications are noted. No adenopathy is appreciable in the abdomen or pelvis. Reproductive: Prostate absent.  Seminal vesicles absent. Other: No periappendiceal  region inflammation. There is a lower abdominal ventral hernia which contains loops of small bowel. The loop of markedly dilated proximal ileum is seen in this area of herniation; the transition zone does not occur as a consequence of this hernia, however. More superiorly, there is a midline ventral hernia containing fat but no bowel. This hernia measures 4.7 cm from right to left dimension. There is no abscess or ascites in the abdomen or pelvis. Musculoskeletal: Pars defects are noted at L5 bilaterally. There is 5 mm of anterolisthesis of L5 on S1. No blastic or lytic bone lesions. No intramuscular lesions are evident. IMPRESSION: 1. Small bowel obstruction with transition zone in the proximal to mid ileal region. A loop of markedly dilated small bowel is noted immediately proximal to the transition zone. This loop of small bowel has a maximum transverse diameter of 10.2 cm. This loop of bowel corresponds to the marked focal dilatation on radiograph from 1 day prior. Note that there is no colonic dilatation. No free air. 2. Midline ventral hernias. The larger hernia contains multiple loops of bowel. The dilated loop of proximal ileum immediately proximal to the transition zone is noted within this hernia. However, the transition zone does not appear to represent a consequence of the hernia. There is a smaller midline ventral hernia containing fat but no bowel. 3. Areas of apparent pneumonia in the right middle lobe and right lower lobe regions. 4. Dilatation of the distal aorta with a measured transverse diameter of 3.6 x 3.2 cm, slightly increased from 2017 study. This area has a saccular appearance. Recommend referral to a vascular specialist. This recommendation follows ACR consensus guidelines: White Paper of the ACR Incidental Findings Committee II on Vascular Findings. J Am Coll Radiol 2013; 10:789-794. This recommendation follows ACR consensus guidelines: White Paper of the ACR Incidental Findings Committee  II on Vascular Findings. J Am Coll Radiol 2013; 10:789-794. this recommendation follows ACR consensus guidelines: White Paper of the ACR Incidental Findings Committee II on Vascular Findings. J Am Coll Radiol 2013; 10:789-794. 5. Pars defects at L5 bilaterally with 5 mm of anterolisthesis of L5 on S1. 6. Gallbladder absent. Prostate absent. These results will be called to the ordering clinician or representative by the Radiologist Assistant, and communication documented in the PACS or [employer]. Electronically Signed   By: Lowella Grip III M.D.   On: 12/07/2019 18:22   DG Abd Portable 1V  Result Date: 12/06/2019 CLINICAL DATA:  Cough, shortness of breath EXAM: PORTABLE ABDOMEN - 1 VIEW COMPARISON:  Radiograph 05/20/2015 FINDINGS: Markedly distended loop of bowel seen in the mid abdomen, unclear if this reflects a loop of large or small bowel. Several additional loops of air-filled bowel with questionable fold thickening are noted in the immediate vicinity. Air and stool projects over the rectal vault. Moderate proximal colonic stool burden is noted. Cholecystectomy clips in the right upper quadrant. Vascular stenting, calcium and additional surgical clips throughout the pelvis. Degenerative changes in the spine, hips and pelvis. IMPRESSION: 1. Markedly distended loop of bowel in the mid abdomen, unclear if this reflects a loop of large or small bowel. Several additional loops of air-filled bowel with questionable fold thickening are noted in the immediate vicinity. Findings raise concern for a bowel  obstruction. Consider further evaluation with CT. Electronically Signed   By: Lovena Le M.D.   On: 12/06/2019 16:01        Scheduled Meds: . aspirin EC  81 mg Oral QODAY  . docusate sodium  200 mg Oral BID  . enoxaparin (LOVENOX) injection  40 mg Subcutaneous Q24H  . folic acid  1 mg Oral Daily  . gabapentin  100 mg Oral QHS  . ipratropium-albuterol  3 mL Nebulization Q6H  .  methylPREDNISolone (SOLU-MEDROL) injection  40 mg Intravenous Q12H  . multivitamin with minerals  1 tablet Oral Daily  . nicotine  21 mg Transdermal Daily  . thiamine  100 mg Oral Daily   Or  . thiamine  100 mg Intravenous Daily  . triamcinolone ointment  1 application Topical BID   Continuous Infusions: . sodium chloride 10 mL/hr at 12/06/19 1144  . sodium chloride 75 mL/hr at 12/08/19 0506  . cefTRIAXone (ROCEPHIN)  IV    . [START ON 12/09/2019] vancomycin       LOS: 3 days    Time spent: 31 mins     Deatra James, MD [facility] Please Amion to page   If 7PM-7AM, please contact night-coverage [email or web address] 12/08/2019, 12:42 PM

## 2019-12-08 NOTE — Progress Notes (Signed)
Rozann Lesches came to see patient and told him he would get his car window fixed. Patient had $347 in his bag in his room this RN counted it and asked patient if he wanted to give Ronalee Belts his money. He stated it was ok to give mike all of his money to fix his car window.

## 2019-12-09 ENCOUNTER — Inpatient Hospital Stay: Payer: Medicare Other

## 2019-12-09 DIAGNOSIS — K56609 Unspecified intestinal obstruction, unspecified as to partial versus complete obstruction: Secondary | ICD-10-CM

## 2019-12-09 DIAGNOSIS — G92 Toxic encephalopathy: Secondary | ICD-10-CM

## 2019-12-09 LAB — CBC WITH DIFFERENTIAL/PLATELET
Abs Immature Granulocytes: 0.19 10*3/uL — ABNORMAL HIGH (ref 0.00–0.07)
Basophils Absolute: 0 10*3/uL (ref 0.0–0.1)
Basophils Relative: 0 %
Eosinophils Absolute: 0.1 10*3/uL (ref 0.0–0.5)
Eosinophils Relative: 0 %
HCT: 46.6 % (ref 39.0–52.0)
Hemoglobin: 14.7 g/dL (ref 13.0–17.0)
Immature Granulocytes: 1 %
Lymphocytes Relative: 2 %
Lymphs Abs: 0.3 10*3/uL — ABNORMAL LOW (ref 0.7–4.0)
MCH: 31.5 pg (ref 26.0–34.0)
MCHC: 31.5 g/dL (ref 30.0–36.0)
MCV: 99.8 fL (ref 80.0–100.0)
Monocytes Absolute: 0.7 10*3/uL (ref 0.1–1.0)
Monocytes Relative: 5 %
Neutro Abs: 13.2 10*3/uL — ABNORMAL HIGH (ref 1.7–7.7)
Neutrophils Relative %: 92 %
Platelets: 139 10*3/uL — ABNORMAL LOW (ref 150–400)
RBC: 4.67 MIL/uL (ref 4.22–5.81)
RDW: 12.9 % (ref 11.5–15.5)
WBC: 14.4 10*3/uL — ABNORMAL HIGH (ref 4.0–10.5)
nRBC: 0 % (ref 0.0–0.2)

## 2019-12-09 LAB — BASIC METABOLIC PANEL
Anion gap: 8 (ref 5–15)
BUN: 42 mg/dL — ABNORMAL HIGH (ref 8–23)
CO2: 22 mmol/L (ref 22–32)
Calcium: 8.5 mg/dL — ABNORMAL LOW (ref 8.9–10.3)
Chloride: 115 mmol/L — ABNORMAL HIGH (ref 98–111)
Creatinine, Ser: 0.77 mg/dL (ref 0.61–1.24)
GFR calc Af Amer: >60 mL/min (ref >60)
GFR calc non Af Amer: >60 mL/min (ref >60)
Glucose, Bld: 127 mg/dL — ABNORMAL HIGH (ref 70–99)
Potassium: 4.8 mmol/L (ref 3.5–5.1)
Sodium: 145 mmol/L (ref 135–145)

## 2019-12-09 LAB — CULTURE, BLOOD (ROUTINE X 2)
Culture: NO GROWTH
Special Requests: ADEQUATE

## 2019-12-09 LAB — LACTIC ACID, PLASMA: Lactic Acid, Venous: 1.3 mmol/L (ref 0.5–1.9)

## 2019-12-09 LAB — MRSA PCR SCREENING: MRSA by PCR: NEGATIVE

## 2019-12-09 MED ORDER — METHYLPREDNISOLONE SODIUM SUCC 40 MG IJ SOLR
40.0000 mg | INTRAMUSCULAR | Status: DC
  Administered 2019-12-10: 40 mg via INTRAVENOUS
  Filled 2019-12-09: qty 1

## 2019-12-09 MED ORDER — SUCRALFATE 1 GM/10ML PO SUSP
1.0000 g | Freq: Three times a day (TID) | ORAL | Status: DC | PRN
  Administered 2019-12-10: 1 g via ORAL
  Filled 2019-12-09: qty 10

## 2019-12-09 MED ORDER — NICOTINE 14 MG/24HR TD PT24
14.0000 mg | MEDICATED_PATCH | Freq: Every day | TRANSDERMAL | Status: DC
  Administered 2019-12-10 – 2019-12-12 (×3): 14 mg via TRANSDERMAL
  Filled 2019-12-09 (×3): qty 1

## 2019-12-09 MED ORDER — PANTOPRAZOLE SODIUM 40 MG PO TBEC
40.0000 mg | DELAYED_RELEASE_TABLET | Freq: Every day | ORAL | Status: DC
  Administered 2019-12-09 – 2019-12-12 (×4): 40 mg via ORAL
  Filled 2019-12-09 (×4): qty 1

## 2019-12-09 MED ORDER — AMLODIPINE BESYLATE 10 MG PO TABS
10.0000 mg | ORAL_TABLET | Freq: Every day | ORAL | Status: DC
  Administered 2019-12-09 – 2019-12-12 (×4): 10 mg via ORAL
  Filled 2019-12-09 (×4): qty 1

## 2019-12-09 NOTE — Progress Notes (Signed)
Physical Therapy Treatment Patient Details Name: Brady Smith MRN: [id number] DOB: Oct 29, 1945 Today's Date: 12/09/2019    History of Present Illness  Brady Smith is a 74 y.o. male with medical history significant of COPD, hypertension, hyperlipidemia, GERD, alcohol abuse, tobacco abuse, PAF not on anticoagulants, GERD, depression, anxiety, bladder cancer, and PVD. He presents with COPD exasperation with a cough and shortness of breath.    PT Comments    Pt was long sitting in bed upon arriving. Tele-sitter in room and pt cleared by RN to participate with PT. He does have cognition deficits however was able to follow commands throughout. Endorsing wanting to find his keys and at times is very impulsive. He is at high fall risk. Was able to exit L side of bed without physical assistance, stood to IV pole + with HHA of 1,and is very unsteady. Ambulated 1 lap in hallway with mod-max assist to prevent fall. Recommend DC to SNF to address balance and safety deficits. At conclusion of session, pt was in bed with sitter present and call bell in reach.      Follow Up Recommendations  SNF     Equipment Recommendations  Other (comment) (defer to next level of care)    Recommendations for Other Services       Precautions / Restrictions Precautions Precautions: Fall Restrictions Weight Bearing Restrictions: No    Mobility  Bed Mobility Overal bed mobility: Modified Independent Bed Mobility: Supine to Sit;Sit to Supine     Supine to sit: Modified independent (Device/Increase time)     General bed mobility comments: Increased time required to exit and re-enter bed however did not require physical assistance  Transfers Overall transfer level: Needs assistance Equipment used: 1 person hand held assist (+1 HHA + pushing IV pole with opposite UE) Transfers: Sit to/from Stand Sit to Stand: Min assist         General transfer comment: pt is unsteady throughout all activity in standing.  poor awareness of deficits. heavy use of gait belt + HHA to prevent LOB throughout ambulation.  Ambulation/Gait Ambulation/Gait assistance: Min assist;Mod assist Gait Distance (Feet): 160 Feet Assistive device: 1 person hand held assist;IV Pole (IV pole + HHA) Gait Pattern/deviations: Decreased step length - right;Decreased step length - left;Step-to pattern;Scissoring;Narrow base of support;Shuffle Gait velocity: decreased   General Gait Details: Pt is high fall risk. He has very unsteady gait. unwilling to use RW this date. pushed IV pole with one UE and HHA on other.  Heavy use of gait bel to prevent fall   S    Balance Overall balance assessment: Needs assistance Sitting-balance support: Bilateral upper extremity supported;Feet supported Sitting balance-Leahy Scale: Fair Sitting balance - Comments: pt's cognition and impulsivity are safety concern however no LOB in sitting.   Standing balance support: Bilateral upper extremity supported;During functional activity Standing balance-Leahy Scale: Poor Standing balance comment: pt is at high fall risk 2/2 to poor standing/ dynamic balance. requires heavy BUE support to prevent fall.          Cognition Arousal/Alertness: Lethargic Behavior During Therapy: Flat affect;Impulsive;Agitated (slightly agitated he couldn't find his keys) Overall Cognitive Status: No family/caregiver present to determine baseline cognitive functioning        General Comments: Pt is awake but very lethargic. oriented to setting but unaware of reason in hospital + poor safety awareness throughout. pt is high fall risk!             Pertinent Vitals/Pain Pain Assessment: No/denies pain  Pain Score: 0-No pain           PT Goals (current goals can now be found in the care plan section) Acute Rehab PT Goals Patient Stated Goal: none stated Progress towards PT goals: Progressing toward goals    Frequency    Min 2X/week      PT Plan Current  plan remains appropriate       AM-PAC PT "6 Clicks" Mobility   Outcome Measure  Help needed turning from your back to your side while in a flat bed without using bedrails?: None Help needed moving from lying on your back to sitting on the side of a flat bed without using bedrails?: None Help needed moving to and from a bed to a chair (including a wheelchair)?: A Little Help needed standing up from a chair using your arms (e.g., wheelchair or bedside chair)?: A Lot Help needed to walk in hospital room?: A Lot Help needed climbing 3-5 steps with a railing? : A Lot 6 Click Score: 17    End of Session Equipment Utilized During Treatment: Gait belt Activity Tolerance: Patient tolerated treatment well Patient left: in bed;with call bell/phone within reach;with bed alarm set;with nursing/sitter in room Nurse Communication: Mobility status PT Visit Diagnosis: Other abnormalities of gait and mobility (R26.89);Muscle weakness (generalized) (M62.81);Unsteadiness on feet (R26.81)     Time: 6812-7517 PT Time Calculation (min) (ACUTE ONLY): 26 min  Charges:  $Gait Training: 8-22 mins $Therapeutic Activity: 8-22 mins                     Julaine Fusi PTA 12/09/19, 3:53 PM

## 2019-12-09 NOTE — Progress Notes (Signed)
PT Cancellation Note  Patient Details Name: Brady Smith MRN: [id number] DOB: 1946-01-29   Cancelled Treatment:      PT attempt.pt currently getting breathing treatment. Will return afterwards.  Willette Pa 12/09/2019, 2:35 PM

## 2019-12-09 NOTE — Progress Notes (Signed)
PROGRESS NOTE    Brady Smith  FYB:[id number] DOB: 01-27-1946 DOA: 12/04/2019 PCP: Glean Hess, MD   Subjective:  The patient was seen and examined this morning, stable reporting improved abdominal pain, he is much more awake alert following command but remains confused--- asking for his house [facility] course:  Brady Smith is a 74 y.o. male with medical history significant of COPD, hypertension, hyperlipidemia, GERD, alcohol abuse, tobacco abuse, PAF not on anticoagulants, GERD, depression, anxiety, bladder cancer, PVD, who presents with cough and shortness of breath... Patient was subsequently electrocuted respiratory failure due to pneumonia, COPD exacerbation  Subsequently during hospitalization the evening of 12/07/2019 developed some abdominal discomfort, imaging, CT scan was consistent with small bowel obstruction-patient was kept n.p.o., IV fluid bolus reinitiated  He received 2 dose of Covid vaccine.  SARS-CoV-2-negative on this admission Satting 94% on 2 L of oxygen (patient is not O2 dependent at home) chest x-ray showed patchy infiltration in right lower lobe.     Assessment & Plan:   Principal Problem:   CAP (community acquired pneumonia) Active Problems:   SBO (small bowel obstruction) (HCC)   Toxic metabolic encephalopathy   COPD exacerbation (HCC)   Essential hypertension   AA (alcohol abuse)   Paroxysmal atrial fibrillation (HCC)   Tobacco abuse   Small bowel obstruction -Improved positive bowel sounds today, he has had 2 bowel movements overnight -We will continue gentle IV fluid hydration -We will obtain a KUB Per nursing staff improved nausea vomiting -He has been kept n.p.o. since 12/07/2019, CT scan consistent with small bowel obstruction -Advancing diet today 12/09/2019   Hx of hernia:  -Stable now  ventral vs umbilical hernia. XR abd shows Markedly distended loop of bowel in the mid abdomen, unclear if this reflects a loop of  large or small bowel. Several additional loops of air-filled bowel with questionable fold thickening are noted in the immediate vicinity. Findings raise concern for a bowel obstruction. CT abd/pelvis was reviewed finding consistent with possible small bowel obstruction  CAP: Improving, afebrile normotensive satting 95% on 3 L of oxygen  - continue on IV rocephin, vancomycin  azithromycin x 5 days -discontinued  -Continue IV steroids.... Tapering down Continue on bronchodilators and encourage incentive spirometry. Legionella & strept are neg. 1 of 2 blood cxs growing staph species, likely contaminant.  Repeat blood cxs >>> no growth to date Continue on supplemental oxygen and wean as tolerated  Ruling out bacteremia/sepsis -Hemodynamics stable afebrile, normotensive mildly tachypneic This patient has persistent leukocytosis, WBC 21.3, 20.6, >>> 14.4 today  procalcitonin 0.10, lactic acid 1.9, -Initial blood cultures growing staph likely contaminant repeat blood cultures negative -Continue empiric antibiotic vancomycin to confirm repeat blood cultures -symptomatic improvement  Acute respiratory failure likely due to COPD exacerbation/CAP COPD exacerbation:  -Not O2 dependent at home, ... Still on 3 L of oxygen, now satting 95% -Continue current antibiotic d/Ced azithromycin, continue Rocephin IV steroids with tapering down -Continue bronchodilators, incentive spirometer    Alcohol abuse: No signs of withdrawal, continue on CIWA protocol. Alcohol cessation counseling.    Hyperkalemia:  Potassium 5.3, 4.9 today still elevated but trending down. Lokelma x 1. Hold lisinopril. Will continue to monitor     Acute toxic metabolic encephalopathy -Likely due to infection, narcotics -We will continue monitor very closely -N.p.o. for now, aspiration precaution We will continue treating underlying cause as above   HTN: Stable,holding home dose of lisinopril secondary to hyperkalemia. IV  hydralazine prn  Tobacco abuse: continue on nicotine patch to prevent w/drawal. Smoking cessation counseling  PAF:  Not on anticoagulation for unknown reason, possible fall risk.    DVT prophylaxis: lovenox  Code Status: full  Family Communication: Disposition Plan: likely d/c to SNF    Status is: Inpatient  Remains inpatient appropriate because:IV treatments appropriate due to intensity of illness or inability to take PO, still scoring on CIWA. CT abd/pelvis ordered to r/o obstruction.    Dispo: The patient is from: Home              Anticipated d/c is to: SNF              Anticipated d/c date is: 3 days              Patient currently is not medically stable to d/c.   Consultants:     Procedures:    Antimicrobials: rocephin, azithromycin      Objective: Vitals:   12/08/19 2055 12/09/19 0141 12/09/19 0628 12/09/19 0758  BP: (!) 151/96 (!) 157/93 135/72 139/72  Pulse: (!) 108 (!) 101 86 89  Resp: 20 (!) 21    Temp: 97.7 F (36.5 C) 97.8 F (36.6 C) 97.6 F (36.4 C)   TempSrc: Oral Axillary Oral   SpO2: (!) 85% 92% 91% 95%  Weight:      Height:       No intake or output data in the 24 hours ending 12/09/19 1138 Filed Weights   12/04/19 0621  Weight: 68 kg        Physical Exam:   General:   Much more awake alert, following command but confused  HEENT:  Normocephalic, PERRL, otherwise with in Normal limits   Neuro:  CNII-XII intact. , normal motor and sensation, reflexes intact   Lungs:   Clear to auscultation BL, Respirations unlabored, no wheezes / crackles  Cardio:    S1/S2, RRR, No murmure, No Rubs or Gallops   Abdomen:   Soft, non-tender, bowel sounds active all four quadrants,  no guarding or peritoneal signs.  Muscular skeletal:  Limited exam - in bed, able to move all 4 extremities, Normal strength,  2+ pulses,  symmetric, No pitting edema  Skin:  Dry, warm to touch, negative for any Rashes, No open wounds  Wounds: Please see nursing  documentation               Data Reviewed: I have personally reviewed following labs and imaging studies  CBC: Recent Labs  Lab 12/06/19 0709 12/07/19 0617 12/08/19 0548 12/08/19 0915 12/09/19 0917  WBC 19.1* 21.3* 21.3* 20.6* 14.4*  NEUTROABS  --   --   --  19.1* 13.2*  HGB 15.1 16.3 17.0 16.7 14.7  HCT 44.2 49.8 51.0 50.1 46.6  MCV 93.8 97.1 97.3 95.8 99.8  PLT 264 279 215 206 308*   Basic Metabolic Panel: Recent Labs  Lab 12/05/19 0631 12/05/19 0631 12/06/19 0709 12/06/19 1505 12/07/19 0617 12/08/19 0548 12/09/19 0449  NA 135  --  137  --  139 140 145  K 4.9   < > 5.2* 5.6* 5.3* 4.9 4.8  CL 102  --  101  --  98 104 115*  CO2 24  --  28  --  31 24 22   GLUCOSE 165*  --  129*  --  175* 149* 127*  BUN 23  --  26*  --  44* 43* 42*  CREATININE 0.97  --  0.76  --  1.04 0.93  0.77  CALCIUM 9.2  --  9.4  --  10.0 9.6 8.5*  MG  --   --   --   --   --  2.5*  --    < > = values in this interval not displayed.   GFR: Estimated Creatinine Clearance: 75.7 mL/min (by C-G formula based on SCr of 0.77 mg/dL). Liver Function Tests: No results for input(s): AST, ALT, ALKPHOS, BILITOT, PROT, ALBUMIN in the last 168 hours. No results for input(s): LIPASE, AMYLASE in the last 168 hours. No results for input(s): AMMONIA in the last 168 hours. Coagulation Profile: No results for input(s): INR, PROTIME in the last 168 hours. Cardiac Enzymes: No results for input(s): CKTOTAL, CKMB, CKMBINDEX, TROPONINI in the last 168 hours. BNP (last 3 results) No results for input(s): PROBNP in the last 8760 hours. HbA1C: No results for input(s): HGBA1C in the last 72 hours. CBG: No results for input(s): GLUCAP in the last 168 hours. Lipid Profile: No results for input(s): CHOL, HDL, LDLCALC, TRIG, CHOLHDL, LDLDIRECT in the last 72 hours. Thyroid Function Tests: No results for input(s): TSH, T4TOTAL, FREET4, T3FREE, THYROIDAB in the last 72 hours. Anemia Panel: No results for input(s):  VITAMINB12, FOLATE, FERRITIN, TIBC, IRON, RETICCTPCT in the last 72 hours. Sepsis Labs: Recent Labs  Lab 12/04/19 0854 12/08/19 0915 12/08/19 1256 12/09/19 0917  PROCALCITON <0.10 0.10  --   --   LATICACIDVEN  --  1.9 1.7 1.3    Recent Results (from the past 240 hour(s))  SARS Coronavirus 2 by RT PCR (hospital order, performed in [facility] hospital lab) Nasopharyngeal Nasopharyngeal Swab     Status: None   Collection Time: 12/04/19  6:25 AM   Specimen: Nasopharyngeal Swab  Result Value Ref Range Status   SARS Coronavirus 2 NEGATIVE NEGATIVE Final    Comment: (NOTE) SARS-CoV-2 target nucleic acids are NOT DETECTED.  The SARS-CoV-2 RNA is generally detectable in upper and lower respiratory specimens during the acute phase of infection. The lowest concentration of SARS-CoV-2 viral copies this assay can detect is 250 copies / mL. A negative result does not preclude SARS-CoV-2 infection and should not be used as the sole basis for treatment or other patient management decisions.  A negative result may occur with improper specimen collection / handling, submission of specimen other than nasopharyngeal swab, presence of viral mutation(s) within the areas targeted by this assay, and inadequate number of viral copies (<250 copies / mL). A negative result must be combined with clinical observations, patient history, and epidemiological information.  Fact Sheet for Patients:   StrictlyIdeas.no  Fact Sheet for Healthcare Providers: BankingDealers.co.za  This test is not yet approved or  cleared by the [place] FDA and has been authorized for detection and/or diagnosis of SARS-CoV-2 by FDA under an Emergency Use Authorization (EUA).  This EUA will remain in effect (meaning this test can be used) for the duration of the COVID-19 declaration under Section 564(b)(1) of the Act, 21 U.S.C. section 360bbb-3(b)(1), unless the authorization  is terminated or revoked sooner.  Performed at [facility], [address] [place], [place] [address]   CULTURE, BLOOD (ROUTINE X 2) w Reflex to ID Panel     Status: Abnormal   Collection Time: 12/04/19 12:35 PM   Specimen: BLOOD  Result Value Ref Range Status   Specimen Description   Final    BLOOD RIGHT [facility] Performed at [facility], [address], [place], [place] [address]    Special  Requests   Final    BOTTLES DRAWN AEROBIC AND ANAEROBIC Blood Culture adequate volume Performed at [facility], [address] [place], [place] [address]    Culture  Setup Time   Final    Organism ID to follow [place] BOTTLE ONLY CRITICAL RESULT CALLED TO, READ BACK BY AND VERIFIED WITH: DAVID BESANTI AT 6578 12/05/19 SDR Performed at [facility], [address] [place], [place] [address]    Culture (A)  Final    STAPHYLOCOCCUS HOMINIS THE SIGNIFICANCE OF ISOLATING THIS ORGANISM FROM A SINGLE SET OF BLOOD CULTURES WHEN MULTIPLE SETS ARE DRAWN IS UNCERTAIN. PLEASE NOTIFY THE MICROBIOLOGY DEPARTMENT WITHIN ONE WEEK IF SPECIATION AND SENSITIVITIES ARE REQUIRED. Performed at [facility], [place] [address] [place], [place] [address]    Report Status 12/06/2019 FINAL  Final  CULTURE, BLOOD (ROUTINE X 2) w Reflex to ID Panel     Status: None   Collection Time: 12/04/19 12:35 PM   Specimen: BLOOD  Result Value Ref Range Status   Specimen Description BLOOD RIGHT FORE ARM  Final   Special Requests   Final    BOTTLES DRAWN AEROBIC AND ANAEROBIC Blood Culture adequate volume   Culture   Final    NO GROWTH 5 DAYS Performed at [facility], [address] [place], [place] [address]    Report Status 12/09/2019 FINAL  Final  Blood Culture ID Panel (Reflexed)     Status: Abnormal   Collection Time: 12/04/19 12:35 PM  Result Value Ref Range Status   Enterococcus faecalis NOT DETECTED NOT DETECTED Final   Enterococcus  Faecium NOT DETECTED NOT DETECTED Final   Listeria monocytogenes NOT DETECTED NOT DETECTED Final   Staphylococcus species DETECTED (A) NOT DETECTED Final    Comment: CRITICAL RESULT CALLED TO, READ BACK BY AND VERIFIED WITH:  DAVID BESANTI AT 0607 12/05/19 SDR    Staphylococcus aureus (BCID) NOT DETECTED NOT DETECTED Final   Staphylococcus epidermidis NOT DETECTED NOT DETECTED Final   Staphylococcus lugdunensis NOT DETECTED NOT DETECTED Final   Streptococcus species NOT DETECTED NOT DETECTED Final   Streptococcus agalactiae NOT DETECTED NOT DETECTED Final   Streptococcus pneumoniae NOT DETECTED NOT DETECTED Final   Streptococcus pyogenes NOT DETECTED NOT DETECTED Final   A.calcoaceticus-baumannii NOT DETECTED NOT DETECTED Final   Bacteroides fragilis NOT DETECTED NOT DETECTED Final   Enterobacterales NOT DETECTED NOT DETECTED Final   Enterobacter cloacae complex NOT DETECTED NOT DETECTED Final   Escherichia coli NOT DETECTED NOT DETECTED Final   Klebsiella aerogenes NOT DETECTED NOT DETECTED Final   Klebsiella oxytoca NOT DETECTED NOT DETECTED Final   Klebsiella pneumoniae NOT DETECTED NOT DETECTED Final   Proteus species NOT DETECTED NOT DETECTED Final   Salmonella species NOT DETECTED NOT DETECTED Final   Serratia marcescens NOT DETECTED NOT DETECTED Final   Haemophilus influenzae NOT DETECTED NOT DETECTED Final   Neisseria meningitidis NOT DETECTED NOT DETECTED Final   Pseudomonas aeruginosa NOT DETECTED NOT DETECTED Final   Stenotrophomonas maltophilia NOT DETECTED NOT DETECTED Final   Candida albicans NOT DETECTED NOT DETECTED Final   Candida auris NOT DETECTED NOT DETECTED Final   Candida glabrata NOT DETECTED NOT DETECTED Final   Candida krusei NOT DETECTED NOT DETECTED Final   Candida parapsilosis NOT DETECTED NOT DETECTED Final   Candida tropicalis NOT DETECTED NOT DETECTED Final   Cryptococcus neoformans/gattii NOT DETECTED NOT DETECTED Final    Comment: Performed at  [facility], [address] [place], [place] [address]  Culture, sputum-assessment     Status: None   Collection Time: 12/05/19 10:05 AM   Specimen: Sputum  Result Value Ref Range Status   Specimen Description SPUTUM  Final   Special Requests NONE  Final   Sputum evaluation   Final    Sputum specimen not acceptable for testing.  Please recollect.   Performed at [facility], [address] [place], [place] [address]    Report Status 12/05/2019 FINAL  Final  CULTURE, BLOOD (ROUTINE X 2) w Reflex to ID Panel     Status: None (Preliminary result)   Collection Time: 12/07/19  8:40 AM   Specimen: BLOOD  Result Value Ref Range Status   Specimen Description BLOOD LEFT ANTECUBITAL  Final   Special Requests   Final    BOTTLES DRAWN AEROBIC AND ANAEROBIC Blood Culture adequate volume   Culture   Final    NO GROWTH 2 DAYS Performed at [facility], [address] [place], [place] [address]    Report Status PENDING  Incomplete  CULTURE, BLOOD (ROUTINE X 2) w Reflex to ID Panel     Status: None (Preliminary result)   Collection Time: 12/07/19  8:48 AM   Specimen: BLOOD  Result Value Ref Range Status   Specimen Description BLOOD BLOOD RIGHT HAND  Final   Special Requests   Final    BOTTLES DRAWN AEROBIC AND ANAEROBIC Blood Culture adequate volume   Culture   Final    NO GROWTH 2 DAYS Performed at [facility], [address], [place], [place] [address]    Report Status PENDING  Incomplete         Radiology Studies: CT ABDOMEN PELVIS WO CONTRAST  Result Date: 12/07/2019 CLINICAL DATA:  Abdominal pain EXAM: CT ABDOMEN AND PELVIS WITHOUT CONTRAST TECHNIQUE: Multidetector CT imaging of the abdomen and pelvis was performed following the standard protocol without IV contrast. Oral contrast was administered. COMPARISON:  Abdominal radiographs December 06, 2019; CT abdomen and pelvis May 27, 2015 FINDINGS: Lower chest: There is consolidation in the  right base. There is also a small area of consolidation in the inferior, lateral aspect of the right middle lobe. There are foci of coronary artery calcification. Hepatobiliary: No focal liver lesions are evident. Gallbladder is absent. There is no appreciable biliary duct dilatation. Pancreas: There is no pancreatic mass or inflammatory focus. Spleen: No splenic lesions are evident. Adrenals/Urinary Tract: Adrenals bilaterally appear normal. There is no renal mass or hydronephrosis on either side. There is an extrarenal pelvis on the right, an anatomic variant. There is a 1 mm calculus in the mid right kidney. There is no ureteral calculus on either side. Urinary bladder is midline with wall thickness within normal limits. Stomach/Bowel: Stomach is filled with fluid. There is small bowel dilatation. There is evidence of there is a focal loop of markedly dilated small bowel in the anterior mid abdomen which contains oral contrast. Immediately distal to this localized marked bowel dilatation, there is a transition zone indicating site of small-bowel obstruction. There is contrast which passes through this area. There is no appreciable distal small bowel obstruction. The terminal ileum appears normal. There is fatty infiltration in the ileocecal valve. There is no appreciable colonic dilatation. There is moderate stool in the colon. There is no appreciable free air or portal venous air. Vascular/Lymphatic: There is no abdominal aortic aneurysm. There is localized dilatation of the distal abdominal aorta measuring 3.6 x 3.2  cm with localized saccular dilatation in this area, also present on previous study with slightly greater dilatation compared to the previous study. There is extensive aortic and iliac artery atherosclerosis. Multiple more distal pelvic arterial calcifications are noted. No adenopathy is appreciable in the abdomen or pelvis. Reproductive: Prostate absent.  Seminal vesicles absent. Other: No  periappendiceal region inflammation. There is a lower abdominal ventral hernia which contains loops of small bowel. The loop of markedly dilated proximal ileum is seen in this area of herniation; the transition zone does not occur as a consequence of this hernia, however. More superiorly, there is a midline ventral hernia containing fat but no bowel. This hernia measures 4.7 cm from right to left dimension. There is no abscess or ascites in the abdomen or pelvis. Musculoskeletal: Pars defects are noted at L5 bilaterally. There is 5 mm of anterolisthesis of L5 on S1. No blastic or lytic bone lesions. No intramuscular lesions are evident. IMPRESSION: 1. Small bowel obstruction with transition zone in the proximal to mid ileal region. A loop of markedly dilated small bowel is noted immediately proximal to the transition zone. This loop of small bowel has a maximum transverse diameter of 10.2 cm. This loop of bowel corresponds to the marked focal dilatation on radiograph from 1 day prior. Note that there is no colonic dilatation. No free air. 2. Midline ventral hernias. The larger hernia contains multiple loops of bowel. The dilated loop of proximal ileum immediately proximal to the transition zone is noted within this hernia. However, the transition zone does not appear to represent a consequence of the hernia. There is a smaller midline ventral hernia containing fat but no bowel. 3. Areas of apparent pneumonia in the right middle lobe and right lower lobe regions. 4. Dilatation of the distal aorta with a measured transverse diameter of 3.6 x 3.2 cm, slightly increased from 2017 study. This area has a saccular appearance. Recommend referral to a vascular specialist. This recommendation follows ACR consensus guidelines: White Paper of the ACR Incidental Findings Committee II on Vascular Findings. J Am Coll Radiol 2013; 10:789-794. This recommendation follows ACR consensus guidelines: White Paper of the ACR Incidental  Findings Committee II on Vascular Findings. J Am Coll Radiol 2013; 10:789-794. this recommendation follows ACR consensus guidelines: White Paper of the ACR Incidental Findings Committee II on Vascular Findings. J Am Coll Radiol 2013; 10:789-794. 5. Pars defects at L5 bilaterally with 5 mm of anterolisthesis of L5 on S1. 6. Gallbladder absent. Prostate absent. These results will be called to the ordering clinician or representative by the Radiologist Assistant, and communication documented in the PACS or [employer]. Electronically Signed   By: Lowella Grip III M.D.   On: 12/07/2019 18:22   DG Abd 1 View  Result Date: 12/09/2019 CLINICAL DATA:  Small bowel obstruction EXAM: ABDOMEN - 1 VIEW COMPARISON:  December 06, 2019; CT abdomen and pelvis December 07, 2019 FINDINGS: There remains a loop of dilated bowel stay right of midline in the mid abdomen which on previous CT examination which shown to represent a loop of small bowel. No other appreciable bowel dilatation evident. No air-fluid levels. No free air. Extensive arterial vascular calcification noted. There are surgical clips in the pelvis. There is patchy opacity in the right lung base. Lung bases otherwise clear. IMPRESSION: Loop of dilated bowel in the mid abdomen remains. No other appreciable bowel dilatation. No free air. Apparent persistent infiltrate right lung base. Electronically Signed   By: Lowella Grip III  M.D.   On: 12/09/2019 09:14        Scheduled Meds: . aspirin EC  81 mg Oral QODAY  . docusate sodium  200 mg Oral BID  . enoxaparin (LOVENOX) injection  40 mg Subcutaneous Q24H  . folic acid  1 mg Oral Daily  . gabapentin  100 mg Oral QHS  . ipratropium-albuterol  3 mL Nebulization Q6H  . [START ON 12/10/2019] methylPREDNISolone (SOLU-MEDROL) injection  40 mg Intravenous Q24H  . multivitamin with minerals  1 tablet Oral Daily  . nicotine  21 mg Transdermal Daily  . thiamine  100 mg Oral Daily   Or  . thiamine   100 mg Intravenous Daily  . triamcinolone ointment  1 application Topical BID   Continuous Infusions: . sodium chloride 10 mL/hr at 12/06/19 1144  . sodium chloride 100 mL/hr at 12/09/19 0558  . cefTRIAXone (ROCEPHIN)  IV 2 g (12/08/19 1529)  . vancomycin 750 mg (12/09/19 0118)     LOS: 4 days    Time spent: 31 mins     Deatra James, MD [facility] Please Amion to page   If 7PM-7AM, please contact night-coverage [email or web address] 12/09/2019, 11:38 AM

## 2019-12-10 ENCOUNTER — Ambulatory Visit: Payer: Medicare Other

## 2019-12-10 LAB — CBC
HCT: 37.5 % — ABNORMAL LOW (ref 39.0–52.0)
Hemoglobin: 12.6 g/dL — ABNORMAL LOW (ref 13.0–17.0)
MCH: 32.4 pg (ref 26.0–34.0)
MCHC: 33.6 g/dL (ref 30.0–36.0)
MCV: 96.4 fL (ref 80.0–100.0)
Platelets: 141 10*3/uL — ABNORMAL LOW (ref 150–400)
RBC: 3.89 MIL/uL — ABNORMAL LOW (ref 4.22–5.81)
RDW: 13 % (ref 11.5–15.5)
WBC: 12.7 10*3/uL — ABNORMAL HIGH (ref 4.0–10.5)
nRBC: 0 % (ref 0.0–0.2)

## 2019-12-10 LAB — BASIC METABOLIC PANEL
Anion gap: 9 (ref 5–15)
BUN: 31 mg/dL — ABNORMAL HIGH (ref 8–23)
CO2: 27 mmol/L (ref 22–32)
Calcium: 8.3 mg/dL — ABNORMAL LOW (ref 8.9–10.3)
Chloride: 110 mmol/L (ref 98–111)
Creatinine, Ser: 0.68 mg/dL (ref 0.61–1.24)
GFR calc Af Amer: >60 mL/min (ref >60)
GFR calc non Af Amer: >60 mL/min (ref >60)
Glucose, Bld: 99 mg/dL (ref 70–99)
Potassium: 3.5 mmol/L (ref 3.5–5.1)
Sodium: 146 mmol/L — ABNORMAL HIGH (ref 135–145)

## 2019-12-10 MED ORDER — HALOPERIDOL LACTATE 5 MG/ML IJ SOLN
2.0000 mg | Freq: Once | INTRAMUSCULAR | Status: AC
  Administered 2019-12-10: 2 mg via INTRAVENOUS
  Filled 2019-12-10: qty 1

## 2019-12-10 MED ORDER — PREDNISONE 20 MG PO TABS
50.0000 mg | ORAL_TABLET | Freq: Every day | ORAL | Status: DC
  Administered 2019-12-10 – 2019-12-11 (×2): 50 mg via ORAL
  Filled 2019-12-10 (×2): qty 3

## 2019-12-10 MED ORDER — CLONAZEPAM 0.5 MG PO TABS
0.5000 mg | ORAL_TABLET | Freq: Two times a day (BID) | ORAL | Status: DC
  Administered 2019-12-10 – 2019-12-11 (×3): 0.5 mg via ORAL
  Filled 2019-12-10 (×3): qty 1

## 2019-12-10 MED ORDER — HALOPERIDOL LACTATE 5 MG/ML IJ SOLN
1.0000 mg | Freq: Four times a day (QID) | INTRAMUSCULAR | Status: DC | PRN
  Administered 2019-12-10: 1 mg via INTRAVENOUS
  Filled 2019-12-10: qty 1

## 2019-12-10 NOTE — Progress Notes (Signed)
2mg  of haldol one time dose given as the patient started to get upset. Patient complains of seeing some black spots from his peripheral vision on the left eye as well as a cut to the eyelid. A Band-Aid applied as request by the patient.

## 2019-12-10 NOTE — TOC Progression Note (Signed)
Transition of Care [facility]) - Progression Note    Patient Details  Name: Brady Smith MRN: [id number] Date of Birth: February 11, 1946  [facility] CM/SW Contact  Beverly Sessions, RN Phone Number: 12/10/2019, 3:45 PM  Clinical Narrative:     Per nursing patient A&O 4 today TOC followed up with patient about recommendation. Patient frustrated and states he is not going to a nursing home.  Patient adamantly refuses SNF.   States that he is not going back to his boarding house.  States that he is going to be staying with a friend named "[name] on the [address] on [address]"  Patient is unable to provide me the address, and unable to provide me with [facility] phone number.   Patient currently requiring acute O2 Patient states "im leaving today whether or not the doctor says I can"  MD notified MD to come see patient.  I have inquired if patient has capacity to make decisions.  MD to make the determination after he sees patient.    Received call from friend [name] [phone].  He is trying to locate a number for the friend named [name]. Brother [name] and sister [name] do not have a contact for [employer].    Expected Discharge Plan: [place]    Expected Discharge Plan and Services Expected Discharge Plan: [place]                                               Social Determinants of Health (SDOH) Interventions    Readmission Risk Interventions No flowsheet data found.

## 2019-12-10 NOTE — Progress Notes (Signed)
Occupational Therapy Treatment Patient Details Name: Brady Smith MRN: [id number] DOB: 04/28/1945 Today's Date: 12/10/2019    History of present illness  Brady Smith is a 74 y.o. male with medical history significant of COPD, hypertension, hyperlipidemia, GERD, alcohol abuse, tobacco abuse, PAF not on anticoagulants, GERD, depression, anxiety, bladder cancer, and PVD. He presents with COPD exasperation with a cough and shortness of breath.   OT comments  When OT entered room for treatment session, pt appeared highly agitated: swearing, complaining of being in low bed with rails, "like a jail," being treated "like a dog." OT able to help the pt calm down and agree to treatment. Engaged in grooming, UB dressing, UB bathing, with MinA but frequent corrections for LOB and VCs for safety awareness. Pt refused to use RW. While standing at sink, pt stated he needed to "use the bathroom," but then immediately begin to go onto the floor (both urine and stool). Transferred to [facility] with [place], [place] for hygiene. Pt walked unsteadily across room to recliner, with frequent LOB, with OT providing MinA and VCs for safety and pt still declining RW. Pt left in recliner with sitter in room. Nurse informed of pt's complaint of "seeing black spots." Pt continues to benefit from skilled OT services to improve safety and functional mobility and decrease fall risk. Discharge plan remains appropriate.   Follow Up Recommendations  SNF    Equipment Recommendations       Recommendations for Other Services      Precautions / Restrictions Precautions Precautions: Fall Restrictions Weight Bearing Restrictions: No       Mobility Bed Mobility Overal bed mobility: Modified Independent Bed Mobility: Supine to Sit     Supine to sit: Min assist     General bed mobility comments: Required HHA to move into supine  Transfers Overall transfer level: Needs assistance Equipment used: 1 person hand held  assist Transfers: Sit to/from Stand Sit to Stand: Min guard         General transfer comment: Pt experienced several LOB episodes, both while standing and walking; poor awareness of deficits.    Balance Overall balance assessment: Needs assistance Sitting-balance support: Feet supported Sitting balance-Leahy Scale: Fair   Postural control: Posterior lean;Left lateral lean Standing balance support: During functional activity;No upper extremity supported Standing balance-Leahy Scale: Poor Standing balance comment: poor standing/dynamic balance, compounded by impulsivity, present safety concerns/high fall risk                           ADL either performed or assessed with clinical judgement   ADL Overall ADL's : Needs assistance/impaired Eating/Feeding: Modified independent   Grooming: Wash/dry hands;Wash/dry face;Oral care;Minimal assistance;Cueing for safety;Standing (with RW)           Upper Body Dressing : Minimal assistance;Sitting;Cueing for sequencing       Toilet Transfer: Moderate assistance;Cueing for safety;BSC Toilet Transfer Details (indicate cue type and reason): Stated had to go to bathroom, but could not make it to [facility] in time, had BM on floor. Refused to use RW. Toileting- Clothing Manipulation and Hygiene: Minimal assistance       Functional mobility during ADLs: Minimal assistance;Cueing for safety General ADL Comments: Impulsive, refuses RW     Vision   Additional Comments: Pt states that he is "seeing black spots," the size of BBs. Nurse notified.   Perception     Praxis      Cognition Arousal/Alertness: Awake/alert Behavior During Therapy:  Agitated;Impulsive Overall Cognitive Status: No family/caregiver present to determine baseline cognitive functioning                                 General Comments: Pt is agitated today; lots of swearing, saying he is being treated "like a dog," that he wants to get out of  here, etc.        Exercises Other Exercises Other Exercises: Educ in falls prevention, little evidence of uptake of info.   Shoulder Instructions       General Comments      Pertinent Vitals/ Pain       Pain Assessment: 0-10 Pain Location: Pt states that his "ass hurts from sitting in bed" Pain Intervention(s): Repositioned  Home Living Family/patient expects to be discharged to:: Group home Living Arrangements: Group Home Available Help at Discharge: Family;Friend(s) Type of Home: Group Home Home Access: Stairs to enter Entrance Stairs-Number of Steps: room on 2nd floor, no elevator     Alternate Level Stairs-Number of Steps: 16             Home Equipment: None   Additional Comments: Pt has been living in a group home with 14 residents.      Prior Functioning/Environment              Frequency  Min 1X/week        Progress Toward Goals  OT Goals(current goals can now be found in the care plan section)     Acute Rehab OT Goals Patient Stated Goal: to "get out of here" OT Goal Formulation: With patient Time For Goal Achievement: 12/21/19 Potential to Achieve Goals: Good  Plan      Co-evaluation                 AM-PAC OT "6 Clicks" Daily Activity     Outcome Measure   Help from another person eating meals?: None Help from another person taking care of personal grooming?: A Little Help from another person toileting, which includes using toliet, bedpan, or urinal?: A Lot Help from another person bathing (including washing, rinsing, drying)?: A Lot Help from another person to put on and taking off regular upper body clothing?: A Little Help from another person to put on and taking off regular lower body clothing?: A Lot 6 Click Score: 16    End of Session Equipment Utilized During Treatment: Gait belt  OT Visit Diagnosis: Unsteadiness on feet (R26.81);Other abnormalities of gait and mobility (R26.89);Muscle weakness (generalized)  (M62.81)   Activity Tolerance Patient tolerated treatment well   Patient Left with nursing/sitter in room;in chair   Nurse Communication Other (comment) (pt's visual complaints ("seeing black spots"))        Time: 9449-6759 OT Time Calculation (min): 42 min  Charges: OT General Charges $OT Visit: 1 Visit OT Treatments $Self Care/Home Management : 38-52 mins   Josiah Lobo, PhD, [place], OTR/L ascom [phone] 12/10/19, 10:31 AM

## 2019-12-10 NOTE — Progress Notes (Signed)
PROGRESS NOTE    Brady Smith  PYP:[id number] DOB: May 15, 1945 DOA: 12/04/2019 PCP: Glean Hess, MD   Subjective:  Patient was seen and examined this morning, stable much more awake, following commands Still mildly hypoxic, satting 86-93% currently 90% on 3 L of oxygen  Stating he is not wanting to go to SNF, would like to return to his group home apartment when stable.  It was discussed with the patient that he still remain to be in hospital as his p.o. intake has not improved, still ruling out and treating pneumonia, sepsis   Hospital course:  Brady Smith is a 74 y.o. male with medical history significant of COPD, hypertension, hyperlipidemia, GERD, alcohol abuse, tobacco abuse, PAF not on anticoagulants, GERD, depression, anxiety, bladder cancer, PVD, who presents with cough and shortness of breath... Patient was subsequently electrocuted respiratory failure due to pneumonia, COPD exacerbation  Subsequently during hospitalization the evening of 12/07/2019 developed some abdominal discomfort, imaging, CT scan was consistent with small bowel obstruction-patient was kept n.p.o., IV fluid bolus reinitiated  He received 2 dose of Covid vaccine.  SARS-CoV-2-negative on this admission Satting 94% on 2 L of oxygen (patient is not O2 dependent at home) chest x-ray showed patchy infiltration in right lower lobe.     Assessment & Plan:   Principal Problem:   CAP (community acquired pneumonia) Active Problems:   SBO (small bowel obstruction) (HCC)   Toxic metabolic encephalopathy   COPD exacerbation (HCC)   Essential hypertension   AA (alcohol abuse)   Paroxysmal atrial fibrillation (HCC)   Tobacco abuse   Small bowel obstruction -Much improved, having gas and bowel movements  -We will obtain a KUB on 12/09/2019 report improvement, still bowel dilatation noted Per nursing staff improved nausea vomiting - 12/07/2019, CT scan consistent with small bowel  obstruction -Advancing diet today 12/09/2019   Hx of hernia:  -Remained stable, spontaneously reducible ventral vs umbilical hernia. XR abd shows Markedly distended loop of bowel in the mid abdomen, unclear if this reflects a loop of large or small bowel. Several additional loops of air-filled bowel with questionable fold thickening are noted in the immediate vicinity. Findings raise concern for a bowel obstruction. CT abd/pelvis was reviewed finding consistent with possible small bowel obstruction  CAP: Improved shortness of breath, remains on 3 L of oxygen satting 90%  - continue on IV rocephin,  -Discontinue vancomycin >> as repeated cultures remain negative to date  azithromycin x 5 days -discontinued  -Continue IV steroids.... Tapering down Continue on bronchodilators and encourage incentive spirometry. Legionella & strept are neg. 1 of 2 blood cxs growing staph species, likely contaminant.  Repeat blood cxs >>> no growth to date Continue on supplemental oxygen and wean as tolerated  Ruling out bacteremia/sepsis -Remains hemodynamically stable with mild tachypnea -Repeat blood cultures no growth to date This patient has persistent leukocytosis, WBC 21.3, 20.6, >>> 14.4 >> 12.7 today  procalcitonin 0.10, lactic acid 1.9, -Discontinuing IV vancomycin -Continue Rocephin -  Acute respiratory failure likely due to COPD exacerbation/CAP COPD exacerbation:  -Not O2 dependent at home, ... Still on 3 L of oxygen, satting 86-93%, currently 90% -Status post treatment with vancomycin discontinued 917, azithromycin completed 5 days -We will continue IV Rocephin -Tapering down steroids  -Continue bronchodilators, incentive spirometer    Alcohol abuse: No signs of withdrawal, will discontinue CIWA today 917.  Alcohol cessation counseling.    Hyperkalemia:  Potassium 5.3, 4.9, 3.5 today still elevated but trending down. Lokelma x  1. Hold lisinopril. Will continue to monitor      Acute toxic metabolic encephalopathy -Likely due to infection, narcotics, Ativan CIWA protocol Ativan will be discontinued today -We will continue monitor very closely We will continue treating underlying cause as above   HTN: BP stable,holding home dose of lisinopril secondary to hyperkalemia. IV hydralazine prn   Tobacco abuse: continue on nicotine patch to prevent w/drawal. Smoking cessation counseling  PAF:  Not on anticoagulation for unknown reason, possible fall risk.    DVT prophylaxis: lovenox  Code Status: full  Family Communication: Disposition Plan: likely d/c to SNF    Status is: Inpatient  Remains inpatient appropriate because:IV treatments appropriate due to intensity of illness or inability to take PO, still scoring on CIWA. CT abd/pelvis ordered to r/o obstruction.    Dispo: The patient is from: Home              Anticipated d/c is to: SNF--- patient currently refusing, would like to return to his group home  Apartment --- which we do not recommend              Anticipated d/c date is: 1-2 days              Patient currently is not medically stable to d/c.   Consultants:     Procedures:    Antimicrobials: rocephin, azithromycin, vancomycin        Objective: Vitals:   12/10/19 0813 12/10/19 1011 12/10/19 1014 12/10/19 1100  BP: 122/66 (!) 135/58 (!) 135/58 120/65  Pulse: 95 79 80 74  Resp: 20   (!) 22  Temp: 98.3 F (36.8 C)   98.1 F (36.7 C)  TempSrc: Oral   Oral  SpO2: 93%   90%  Weight:      Height:        Intake/Output Summary (Last 24 hours) at 12/10/2019 1256 Last data filed at 12/10/2019 1017 Gross per 24 hour  Intake 240 ml  Output 250 ml  Net -10 ml   Filed Weights   12/04/19 0621  Weight: 68 kg        Physical Exam:   General:  Alert,  agitated, mildly somnolent  HEENT:  Normocephalic, PERRL, otherwise with in Normal limits   Neuro:  CNII-XII intact. , normal motor and sensation, reflexes intact   Lungs:    Clear to auscultation BL, Respirations unlabored, no wheezes / crackles  Cardio:    S1/S2, RRR, No murmure, No Rubs or Gallops   Abdomen:   Soft, non-tender, bowel sounds active all four quadrants,  no guarding or peritoneal signs.  Muscular skeletal:  Limited exam - in bed, able to move all 4 extremities, Normal strength,  2+ pulses,  symmetric, No pitting edema  Skin:  Dry, warm to touch, negative for any Rashes, No open wounds  Wounds: Please see nursing documentation                 Data Reviewed: I have personally reviewed following labs and imaging studies  CBC: Recent Labs  Lab 12/07/19 0617 12/08/19 0548 12/08/19 0915 12/09/19 0917 12/10/19 0534  WBC 21.3* 21.3* 20.6* 14.4* 12.7*  NEUTROABS  --   --  19.1* 13.2*  --   HGB 16.3 17.0 16.7 14.7 12.6*  HCT 49.8 51.0 50.1 46.6 37.5*  MCV 97.1 97.3 95.8 99.8 96.4  PLT 279 215 206 139* 419*   Basic Metabolic Panel: Recent Labs  Lab 12/06/19 0709 12/06/19 0709 12/06/19 1505 12/07/19 0617  12/08/19 0548 12/09/19 0449 12/10/19 0534  NA 137  --   --  139 140 145 146*  K 5.2*   < > 5.6* 5.3* 4.9 4.8 3.5  CL 101  --   --  98 104 115* 110  CO2 28  --   --  31 24 22 27   GLUCOSE 129*  --   --  175* 149* 127* 99  BUN 26*  --   --  44* 43* 42* 31*  CREATININE 0.76  --   --  1.04 0.93 0.77 0.68  CALCIUM 9.4  --   --  10.0 9.6 8.5* 8.3*  MG  --   --   --   --  2.5*  --   --    < > = values in this interval not displayed.  Sepsis Labs: Recent Labs  Lab 12/04/19 0854 12/08/19 0915 12/08/19 1256 12/09/19 0917  PROCALCITON <0.10 0.10  --   --   LATICACIDVEN  --  1.9 1.7 1.3    Recent Results (from the past 240 hour(s))  SARS Coronavirus 2 by RT PCR (hospital order, performed in [facility] hospital lab) Nasopharyngeal Nasopharyngeal Swab     Status: None   Collection Time: 12/04/19  6:25 AM   Specimen: Nasopharyngeal Swab  Result Value Ref Range Status   SARS Coronavirus 2 NEGATIVE NEGATIVE Final    Comment:  (NOTE) SARS-CoV-2 target nucleic acids are NOT DETECTED.  The SARS-CoV-2 RNA is generally detectable in upper and lower respiratory specimens during the acute phase of infection. The lowest concentration of SARS-CoV-2 viral copies this assay can detect is 250 copies / mL. A negative result does not preclude SARS-CoV-2 infection and should not be used as the sole basis for treatment or other patient management decisions.  A negative result may occur with improper specimen collection / handling, submission of specimen other than nasopharyngeal swab, presence of viral mutation(s) within the areas targeted by this assay, and inadequate number of viral copies (<250 copies / mL). A negative result must be combined with clinical observations, patient history, and epidemiological information.  Fact Sheet for Patients:   StrictlyIdeas.no  Fact Sheet for Healthcare Providers: BankingDealers.co.za  This test is not yet approved or  cleared by the [place] FDA and has been authorized for detection and/or diagnosis of SARS-CoV-2 by FDA under an Emergency Use Authorization (EUA).  This EUA will remain in effect (meaning this test can be used) for the duration of the COVID-19 declaration under Section 564(b)(1) of the Act, 21 U.S.C. section 360bbb-3(b)(1), unless the authorization is terminated or revoked sooner.  Performed at [facility], [address] [place], [place] [address]   CULTURE, BLOOD (ROUTINE X 2) w Reflex to ID Panel     Status: Abnormal   Collection Time: 12/04/19 12:35 PM   Specimen: BLOOD  Result Value Ref Range Status   Specimen Description   Final    BLOOD RIGHT [facility] Performed at [facility], [address], [place], [place] [address]    Special Requests   Final    BOTTLES DRAWN AEROBIC AND ANAEROBIC Blood Culture adequate volume Performed at [facility], [address] [place],  [place] [address]    Culture  Setup Time   Final    Organism ID to follow [place] ONLY CRITICAL RESULT CALLED TO, READ BACK BY AND VERIFIED WITH: DAVID BESANTI AT 8676 12/05/19 [place] Performed at [facility], [address] [place], [place] [address]    Culture (A)  Final    STAPHYLOCOCCUS HOMINIS THE SIGNIFICANCE OF ISOLATING THIS ORGANISM FROM A SINGLE SET OF BLOOD CULTURES WHEN MULTIPLE SETS ARE DRAWN IS UNCERTAIN. PLEASE NOTIFY THE MICROBIOLOGY DEPARTMENT WITHIN ONE WEEK IF SPECIATION AND SENSITIVITIES ARE REQUIRED. Performed at [facility], [place] [address], [place], [place] [address]    Report Status 12/06/2019 FINAL  Final  CULTURE, BLOOD (ROUTINE X 2) w Reflex to ID Panel     Status: None   Collection Time: 12/04/19 12:35 PM   Specimen: BLOOD  Result Value Ref Range Status   Specimen Description BLOOD RIGHT FORE ARM  Final   Special Requests   Final    BOTTLES DRAWN AEROBIC AND ANAEROBIC Blood Culture adequate volume   Culture   Final    NO GROWTH 5 DAYS Performed at [facility], [address] [place], [place] [address]    Report Status 12/09/2019 FINAL  Final  Blood Culture ID Panel (Reflexed)     Status: Abnormal   Collection Time: 12/04/19 12:35 PM  Result Value Ref Range Status   Enterococcus faecalis NOT DETECTED NOT DETECTED Final   Enterococcus Faecium NOT DETECTED NOT DETECTED Final   Listeria monocytogenes NOT DETECTED NOT DETECTED Final   Staphylococcus species DETECTED (A) NOT DETECTED Final    Comment: CRITICAL RESULT CALLED TO, READ BACK BY AND VERIFIED WITH:  DAVID BESANTI AT 0607 12/05/19 SDR    Staphylococcus aureus (BCID) NOT DETECTED NOT DETECTED Final   Staphylococcus epidermidis NOT DETECTED NOT DETECTED Final   Staphylococcus lugdunensis NOT DETECTED NOT DETECTED Final   Streptococcus species NOT DETECTED NOT DETECTED Final   Streptococcus agalactiae NOT DETECTED NOT DETECTED Final   Streptococcus  pneumoniae NOT DETECTED NOT DETECTED Final   Streptococcus pyogenes NOT DETECTED NOT DETECTED Final   A.calcoaceticus-baumannii NOT DETECTED NOT DETECTED Final   Bacteroides fragilis NOT DETECTED NOT DETECTED Final   Enterobacterales NOT DETECTED NOT DETECTED Final   Enterobacter cloacae complex NOT DETECTED NOT DETECTED Final   Escherichia coli NOT DETECTED NOT DETECTED Final   Klebsiella aerogenes NOT DETECTED NOT DETECTED Final   Klebsiella oxytoca NOT DETECTED NOT DETECTED Final   Klebsiella pneumoniae NOT DETECTED NOT DETECTED Final   Proteus species NOT DETECTED NOT DETECTED Final   Salmonella species NOT DETECTED NOT DETECTED Final   Serratia marcescens NOT DETECTED NOT DETECTED Final   Haemophilus influenzae NOT DETECTED NOT DETECTED Final   Neisseria meningitidis NOT DETECTED NOT DETECTED Final   Pseudomonas aeruginosa NOT DETECTED NOT DETECTED Final   Stenotrophomonas maltophilia NOT DETECTED NOT DETECTED Final   Candida albicans NOT DETECTED NOT DETECTED Final   Candida auris NOT DETECTED NOT DETECTED Final   Candida glabrata NOT DETECTED NOT DETECTED Final   Candida krusei NOT DETECTED NOT DETECTED Final   Candida parapsilosis NOT DETECTED NOT DETECTED Final   Candida tropicalis NOT DETECTED NOT DETECTED Final   Cryptococcus neoformans/gattii NOT DETECTED NOT DETECTED Final    Comment: Performed at [facility], [address] [place], [place] [address]  Culture, sputum-assessment     Status: None   Collection Time: 12/05/19 10:05 AM   Specimen: Sputum  Result Value Ref Range Status   Specimen Description SPUTUM  Final   Special Requests NONE  Final   Sputum evaluation   Final    Sputum specimen not acceptable for testing.  Please recollect.   Performed at [facility], [address],  [place], [place] [address]    Report Status 12/05/2019 FINAL  Final  CULTURE, BLOOD (ROUTINE X 2) w Reflex to ID Panel     Status: None (Preliminary result)    Collection Time: 12/07/19  8:40 AM   Specimen: BLOOD  Result Value Ref Range Status   Specimen Description BLOOD LEFT ANTECUBITAL  Final   Special Requests   Final    BOTTLES DRAWN AEROBIC AND ANAEROBIC Blood Culture adequate volume   Culture   Final    NO GROWTH 3 DAYS Performed at [facility], [address] [place], [place] [address]    Report Status PENDING  Incomplete  CULTURE, BLOOD (ROUTINE X 2) w Reflex to ID Panel     Status: None (Preliminary result)   Collection Time: 12/07/19  8:48 AM   Specimen: BLOOD  Result Value Ref Range Status   Specimen Description BLOOD BLOOD RIGHT HAND  Final   Special Requests   Final    BOTTLES DRAWN AEROBIC AND ANAEROBIC Blood Culture adequate volume   Culture   Final    NO GROWTH 3 DAYS Performed at [facility], [address] [place], [place] [address]    Report Status PENDING  Incomplete  MRSA PCR Screening     Status: None   Collection Time: 12/09/19  1:05 PM   Specimen: Nasopharyngeal  Result Value Ref Range Status   MRSA by PCR NEGATIVE NEGATIVE Final    Comment:        The GeneXpert MRSA Assay (FDA approved for NASAL specimens only), is one component of a comprehensive MRSA colonization surveillance program. It is not intended to diagnose MRSA infection nor to guide or monitor treatment for MRSA infections. Performed at [facility], [address], [place], [place] [address]          Radiology Studies: DG Abd 1 View  Result Date: 12/09/2019 CLINICAL DATA:  Small bowel obstruction EXAM: ABDOMEN - 1 VIEW COMPARISON:  December 06, 2019; CT abdomen and pelvis December 07, 2019 FINDINGS: There remains a loop of dilated bowel stay right of midline in the mid abdomen which on previous CT examination which shown to represent a loop of small bowel. No other appreciable bowel dilatation evident. No air-fluid levels. No free air. Extensive arterial vascular calcification noted. There are  surgical clips in the pelvis. There is patchy opacity in the right lung base. Lung bases otherwise clear. IMPRESSION: Loop of dilated bowel in the mid abdomen remains. No other appreciable bowel dilatation. No free air. Apparent persistent infiltrate right lung base. Electronically Signed   By: Lowella Grip III M.D.   On: 12/09/2019 09:14        Scheduled Meds: . amLODipine  10 mg Oral Daily  . aspirin EC  81 mg Oral QODAY  . docusate sodium  200 mg Oral BID  . enoxaparin (LOVENOX) injection  40 mg Subcutaneous Q24H  . folic acid  1 mg Oral Daily  . gabapentin  100 mg Oral QHS  . multivitamin with minerals  1 tablet Oral Daily  . nicotine  14 mg Transdermal Daily  . pantoprazole  40 mg Oral Daily  . predniSONE  50 mg Oral Q breakfast  . thiamine  100 mg Oral Daily   Or  . thiamine  100 mg Intravenous Daily  . triamcinolone ointment  1 application Topical BID   Continuous Infusions: . sodium chloride 10 mL/hr at 12/06/19 1144  . sodium chloride 100 mL/hr at 12/09/19 1851  .  cefTRIAXone (ROCEPHIN)  IV 2 g (12/09/19 1419)     LOS: 5 days    Time spent: 31 mins     Deatra James, MD [facility] Please Amion to page   If 7PM-7AM, please contact night-coverage [email or web address] 12/10/2019, 12:56 PM

## 2019-12-10 NOTE — Care Management Important Message (Signed)
Important Message  Patient Details  Name: Brady Smith MRN: [id number] Date of Birth: November 24, 1945   Medicare Important Message Given:  Yes     Dannette Barbara 12/10/2019, 11:52 AM

## 2019-12-10 NOTE — Progress Notes (Addendum)
Mobility Specialist - Progress Note   12/10/19 1232  Mobility  Activity Refused mobility  Mobility performed by Mobility specialist     Pt sitting on recliner w/ CNA and sitter present in room. Pt initially agreed to session. Per discussion w/ CNA, CNA states pt was requesting to ambulate. However, pt becomes agitated after wanting to change into personal clothing and is wanting to go home. He was swearing, complaining, states "you young people think we're dumb". Noted pt is alert, but confused. Pt was encouraged to remain in hospital gown for its convenient purposes, and staff provided bottoms for the pt as an alternative. Pt adamant on changing and wanting to go home. Pt made a phone call and requested the person to come get them. Deferred session d/t pt's behavior. Pt left in room w/ sitter present. Nurse was notified immediately.    Keilany [place] Mobility Specialist  12/10/19, 12:53 PM

## 2019-12-11 LAB — BASIC METABOLIC PANEL
Anion gap: 9 (ref 5–15)
BUN: 33 mg/dL — ABNORMAL HIGH (ref 8–23)
CO2: 25 mmol/L (ref 22–32)
Calcium: 8.9 mg/dL (ref 8.9–10.3)
Chloride: 110 mmol/L (ref 98–111)
Creatinine, Ser: 0.74 mg/dL (ref 0.61–1.24)
GFR calc Af Amer: >60 mL/min (ref >60)
GFR calc non Af Amer: >60 mL/min (ref >60)
Glucose, Bld: 121 mg/dL — ABNORMAL HIGH (ref 70–99)
Potassium: 3.2 mmol/L — ABNORMAL LOW (ref 3.5–5.1)
Sodium: 144 mmol/L (ref 135–145)

## 2019-12-11 LAB — CBC
HCT: 40.4 % (ref 39.0–52.0)
Hemoglobin: 13.4 g/dL (ref 13.0–17.0)
MCH: 31.5 pg (ref 26.0–34.0)
MCHC: 33.2 g/dL (ref 30.0–36.0)
MCV: 95.1 fL (ref 80.0–100.0)
Platelets: 180 10*3/uL (ref 150–400)
RBC: 4.25 MIL/uL (ref 4.22–5.81)
RDW: 12.8 % (ref 11.5–15.5)
WBC: 13.2 10*3/uL — ABNORMAL HIGH (ref 4.0–10.5)
nRBC: 0 % (ref 0.0–0.2)

## 2019-12-11 MED ORDER — CLONAZEPAM 0.25 MG PO TBDP
0.2500 mg | ORAL_TABLET | Freq: Three times a day (TID) | ORAL | Status: DC
  Administered 2019-12-11 – 2019-12-12 (×3): 0.25 mg via ORAL
  Filled 2019-12-11 (×3): qty 1

## 2019-12-11 MED ORDER — CLONAZEPAM 0.5 MG PO TABS: 0.5000 mg | ORAL_TABLET | Freq: Three times a day (TID) | ORAL | Status: DC

## 2019-12-11 MED ORDER — PREDNISONE 20 MG PO TABS: 40.0000 mg | ORAL_TABLET | Freq: Every day | ORAL | Status: DC

## 2019-12-11 MED ORDER — OLANZAPINE 2.5 MG PO TABS
1.2500 mg | ORAL_TABLET | Freq: Every day | ORAL | Status: DC
  Administered 2019-12-11: 1.25 mg via ORAL
  Filled 2019-12-11 (×2): qty 0.5

## 2019-12-11 MED ORDER — PREDNISONE 20 MG PO TABS
20.0000 mg | ORAL_TABLET | Freq: Every day | ORAL | Status: DC
  Administered 2019-12-12: 20 mg via ORAL
  Filled 2019-12-11: qty 1

## 2019-12-11 MED ORDER — HALOPERIDOL 0.5 MG PO TABS
0.2500 mg | ORAL_TABLET | Freq: Three times a day (TID) | ORAL | Status: DC
  Administered 2019-12-11 – 2019-12-12 (×3): 0.25 mg via ORAL
  Filled 2019-12-11 (×5): qty 0.5

## 2019-12-11 NOTE — Consult Note (Signed)
[facility] Consult   Reason for Consult:   MS issues capacity, mood and anxiety during medical illness  Referring Physician:   IM team  Patient Identification: Brady Smith MRN:  [id number] Principal Diagnosis: CAP (community acquired pneumonia) Diagnosis:  Principal Problem:   CAP (community acquired pneumonia) Active Problems:   SBO (small bowel obstruction) (HCC)   Toxic metabolic encephalopathy   COPD exacerbation ([place])   Essential hypertension   AA (alcohol abuse)   Paroxysmal atrial fibrillation (HCC)   Tobacco abuse anxiety due to medical illness   Total Time spent with patient: 45 min or so  Subjective:   Brady Smith is a 74 y.o. male patient admitted with  Above medical problems along with anxiety due to general medical illness   HPI:   He has no major psych history but has issues of COPD and ETOH ism,  He is here for med mgt of complexities already noted.  Last day or two he was markedly anxious, nervous, tense frustrated, and had some psych motor elevation.  Did not have psychosis or delirium per se.  He was frustrated with medical problems and also was worried about bills and all at home and that he might want to change group homes.  He calmed down with regular klonopin dosing but has some trouble sleeping  He has no active SI HI or plans  --he contracts for safety    Today he is better and does meet capacity for consent ----he understands the nature of his illness and the implications if treated and not treated and what benefits and risks there are and sequential consequences if not treated.     Past Psychiatric History: He has no past  Major psych issues at present   Previously has had issues with ETOH on and off as well, no major active treatment no major admissions or psych meds When asked about depression and anxiety per se he says that anxiety is the biggest issue in coping with the current medical issues   Risk to Self:  currently none  Risk  to Others:  none cooperative  Prior Inpatient Therapy:  none recent  Prior Outpatient Therapy:  none recent   Past Medical History:  Past Medical History:  Diagnosis Date   Anxiety    Bladder cancer ([place])    COPD (chronic obstructive pulmonary disease) ([place])    Depression    GERD (gastroesophageal reflux disease)    History of kidney cancer    Peripheral vascular disease ([place])    Pneumonia     Past Surgical History:  Procedure Laterality Date   AMPUTATION Right 07/19/2015   Procedure: AMPUTATION DIGIT ( RIGHT FOOT FIFTH TOE );  Surgeon: Algernon Huxley, MD;  Location: [facility];  Service: Vascular;  Laterality: Right;   BOWEL RESECTION  05/20/2015   Procedure: SMALL BOWEL RESECTION;  Surgeon: Clayburn Pert, MD;  Location: [facility];  Service: General;;   CATARACT EXTRACTION W/PHACO Left 10/06/2018   Procedure: CATARACT EXTRACTION PHACO AND INTRAOCULAR LENS PLACEMENT ([place])  LEFT;  Surgeon: Birder Robson, MD;  Location: [facility];  Service: Ophthalmology;  Laterality: Left;  DAY OF TEST. LIVES IN BOARDING HOUSE   CATARACT EXTRACTION W/PHACO Right 10/27/2018   Procedure: CATARACT EXTRACTION PHACO AND INTRAOCULAR LENS PLACEMENT ([place])  RIGHT;  Surgeon: Birder Robson, MD;  Location: [facility];  Service: Ophthalmology;  Laterality: Right;  PT HAS TO HAVE COVID TEST MORNING OF LEAVE AT LAST PATIENT   CHOLECYSTECTOMY  COLONOSCOPY  2000   CYSTECTOMY W/ CONTINENT DIVERSION  1996   HERNIA REPAIR     LAPAROTOMY N/A 05/20/2015   Procedure: EXPLORATORY LAPAROTOMY;  Surgeon: Clayburn Pert, MD;  Location: [facility];  Service: General;  Laterality: N/A;   PERIPHERAL VASCULAR CATHETERIZATION N/A 07/03/2015   Procedure: Abdominal Aortogram w/Lower Extremity;  Surgeon: Algernon Huxley, MD;  Location: [facility];  Service: Cardiovascular;  Laterality: N/A;   PERIPHERAL VASCULAR CATHETERIZATION  07/03/2015   Procedure: Lower Extremity Intervention;  Surgeon:  Algernon Huxley, MD;  Location: [facility];  Service: Cardiovascular;;   PROSTATECTOMY  1996   TONSILLECTOMY     Family History:  Family History  Problem Relation Age of Onset   Heart disease Mother    Heart disease Father    COPD Sister    Family Psychiatric  History:  He has remaining siblings but does not know their history   His kids are now grown and keep in touch periodically  Social History:  Social History   Substance and Sexual Activity  Alcohol Use Yes   Alcohol/week: 12.0 standard drinks   Types: 12 Cans of beer per week     Social History   Substance and Sexual Activity  Drug Use No    Social History   Socioeconomic History   Marital status: Widowed    Spouse name: Not on file   Number of children: 2   Years of education: Not on file   Highest education level: 8th grade  Occupational History   Occupation: disabled  Tobacco Use   Smoking status: Current Every Day Smoker    Packs/day: 0.25    Years: 65.00    Pack years: 16.25    Types: Cigarettes   Smokeless tobacco: Never Used   Tobacco comment: reduced # of packs smoked to 5-6 ciggs daily from 3 pks daily   Vaping Use   Vaping Use: Never used  Substance and Sexual Activity   Alcohol use: Yes    Alcohol/week: 12.0 standard drinks    Types: 12 Cans of beer per week   Drug use: No   Sexual activity: Not Currently  Other Topics Concern   Not on file  Social History Narrative   Not on file   Social Determinants of Health   Financial Resource Strain:    Difficulty of Paying Living Expenses: Not on file  Food Insecurity:    Worried About [place] in the Last Year: Not on file   Ran Out of Food in the Last Year: Not on file  Transportation Needs:    Lack of Transportation (Medical): Not on file   Lack of Transportation (Non-Medical): Not on file  Physical Activity:    Days of Exercise per Week: Not on file   Minutes of Exercise per Session: Not on  file  Stress:    Feeling of Stress : Not on file  Social Connections:    Frequency of Communication with Friends and Family: Not on file   Frequency of Social Gatherings with Friends and Family: Not on file   Attends Religious Services: Not on file   Active Member of Clubs or Organizations: Not on file   Attends Archivist Meetings: Not on file   Marital Status: Not on file   Additional Social History:  Lives in boarding home --but he wants to change homes   He says he is calmer because his rent and other tasks have been taken  care of     Allergies:   Allergies  Allergen Reactions   Influenza Vaccines     High blood pressure- had to be admitted   Lipitor [Atorvastatin Calcium]     Muscle pain    Labs:  Results for orders placed or performed during the hospital encounter of 12/04/19 (from the past 48 hour(s))  Basic metabolic panel     Status: Abnormal   Collection Time: 12/10/19  5:34 AM  Result Value Ref Range   Sodium 146 (H) 135 - 145 mmol/L   Potassium 3.5 3.5 - 5.1 mmol/L   Chloride 110 98 - 111 mmol/L   CO2 27 22 - 32 mmol/L   Glucose, Bld 99 70 - 99 mg/dL    Comment: Glucose reference range applies only to samples taken after fasting for at least 8 hours.   BUN 31 (H) 8 - 23 mg/dL   Creatinine, Ser 0.68 0.61 - 1.24 mg/dL   Calcium 8.3 (L) 8.9 - 10.3 mg/dL   GFR calc non Af Amer >60 >60 mL/min   GFR calc Af Amer >60 >60 mL/min   Anion gap 9 5 - 15    Comment: Performed at [facility], [address] [place], [place] [address]  CBC     Status: Abnormal   Collection Time: 12/10/19  5:34 AM  Result Value Ref Range   WBC 12.7 (H) 4.0 - 10.5 K/uL   RBC 3.89 (L) 4.22 - 5.81 MIL/uL   Hemoglobin 12.6 (L) 13.0 - 17.0 g/dL   HCT 37.5 (L) 39 - 52 %   MCV 96.4 80.0 - 100.0 fL   MCH 32.4 26.0 - 34.0 pg   MCHC 33.6 30.0 - 36.0 g/dL   RDW 13.0 11.5 - 15.5 %   Platelets 141 (L) 150 - 400 K/uL   nRBC 0.0 0.0 - 0.2 %    Comment: Performed at  [facility], [address] [place] [address], [place], [place] [address]  Basic metabolic panel     Status: Abnormal   Collection Time: 12/11/19  8:30 AM  Result Value Ref Range   Sodium 144 135 - 145 mmol/L   Potassium 3.2 (L) 3.5 - 5.1 mmol/L   Chloride 110 98 - 111 mmol/L   CO2 25 22 - 32 mmol/L   Glucose, Bld 121 (H) 70 - 99 mg/dL    Comment: Glucose reference range applies only to samples taken after fasting for at least 8 hours.   BUN 33 (H) 8 - 23 mg/dL   Creatinine, Ser 0.74 0.61 - 1.24 mg/dL   Calcium 8.9 8.9 - 10.3 mg/dL   GFR calc non Af Amer >60 >60 mL/min   GFR calc Af Amer >60 >60 mL/min   Anion gap 9 5 - 15    Comment: Performed at [facility], [address] [place], [place] [address]  CBC     Status: Abnormal   Collection Time: 12/11/19  8:30 AM  Result Value Ref Range   WBC 13.2 (H) 4.0 - 10.5 K/uL   RBC 4.25 4.22 - 5.81 MIL/uL   Hemoglobin 13.4 13.0 - 17.0 g/dL   HCT 40.4 39 - 52 %   MCV 95.1 80.0 - 100.0 fL   MCH 31.5 26.0 - 34.0 pg   MCHC 33.2 30.0 - 36.0 g/dL   RDW 12.8 11.5 - 15.5 %   Platelets 180 150 - 400 K/uL   nRBC 0.0 0.0 - 0.2 %    Comment: Performed at  [facility], [address] [place], [place] [address]    Current Facility-Administered Medications  Medication Dose Route Frequency Provider Last Rate Last Admin   0.9 %  sodium chloride infusion   Intravenous PRN Wyvonnia Dusky, MD 10 mL/hr at 12/06/19 1144 New Bag at 12/06/19 1144   acetaminophen (TYLENOL) tablet 650 mg  650 mg Oral Q6H PRN Ivor Costa, MD   650 mg at 12/07/19 1859   albuterol (PROVENTIL) (2.5 MG/3ML) 0.083% nebulizer solution 2.5 mg  2.5 mg Nebulization Q4H PRN Ivor Costa, MD   2.5 mg at 12/11/19 0145   alum & mag hydroxide-simeth (MAALOX/MYLANTA) 200-200-20 MG/5ML suspension 30 mL  30 mL Oral Q4H PRN Wyvonnia Dusky, MD       amLODipine (NORVASC) tablet 10 mg  10 mg Oral Daily Shahmehdi, Seyed A, MD   10 mg at 12/11/19 1023   aspirin EC tablet  81 mg  81 mg Oral Henreitta Cea, MD   81 mg at 12/10/19 0817   cefTRIAXone (ROCEPHIN) 2 g in sodium chloride 0.9 % 100 mL IVPB  2 g Intravenous Q24H Dallie Piles, RPH 200 mL/hr at 12/10/19 1352 2 g at 12/10/19 1352   clonazePAM (KLONOPIN) tablet 0.5 mg  0.5 mg Oral TID Eulas Post, MD       dextromethorphan-guaiFENesin [facility] DM) 30-600 MG per 12 hr tablet 1 tablet  1 tablet Oral BID PRN Ivor Costa, MD       docusate sodium (COLACE) capsule 200 mg  200 mg Oral BID Wyvonnia Dusky, MD   200 mg at 74/25/95 6387   folic acid (FOLVITE) tablet 1 mg  1 mg Oral Daily Ivor Costa, MD   1 mg at 12/11/19 1024   gabapentin (NEURONTIN) capsule 100 mg  100 mg Oral QHS Ivor Costa, MD   100 mg at 12/10/19 2000   haloperidol (HALDOL) tablet 0.25 mg  0.25 mg Oral TID Eulas Post, MD       haloperidol lactate (HALDOL) injection 1 mg  1 mg Intravenous Q6H PRN Skipper Cliche A, MD   1 mg at 12/10/19 1859   multivitamin with minerals tablet 1 tablet  1 tablet Oral Daily Ivor Costa, MD   1 tablet at 12/11/19 1023   nicotine (NICODERM CQ - dosed in mg/24 hours) patch 14 mg  14 mg Transdermal Daily Shahmehdi, Seyed A, MD   14 mg at 12/11/19 1029   OLANZapine (ZYPREXA) tablet 1.25 mg  1.25 mg Oral QHS Eulas Post, MD       pantoprazole (PROTONIX) EC tablet 40 mg  40 mg Oral Daily Shahmehdi, Seyed A, MD   40 mg at 12/11/19 1023   [START ON 12/12/2019] predniSONE (DELTASONE) tablet 40 mg  40 mg Oral Q breakfast Shahmehdi, Seyed A, MD       promethazine (PHENERGAN) injection 12.5 mg  12.5 mg Intravenous Q6H PRN Wyvonnia Dusky, MD   12.5 mg at 12/07/19 2042   sucralfate (CARAFATE) 1 GM/10ML suspension 1 g  1 g Oral TID PRN Skipper Cliche A, MD   1 g at 12/10/19 0817   thiamine tablet 100 mg  100 mg Oral Daily Ivor Costa, MD   100 mg at 12/11/19 1024   Or   thiamine (B-1) injection 100 mg  100 mg Intravenous Daily Ivor Costa, MD       triamcinolone ointment (KENALOG) 0.5 % 1  application  1 application Topical BID Ivor Costa, MD   1 application at  12/11/19 1030    Musculoskeletal: Strength & Muscle Tone:   May need PT to help assess prior to discharge  Gait & Station: ambulates slowly with asistance  Patient leans:  None   Handedness not known Musculoskeletal  See above Recall okay  Language ENGLish  Akathisia none Assets  ---stable in group home ADL's  Needs assistance for now Sleep on and off No active SI HI or plans Contracts for safety  At this time consciousness not clouded or fluctuant Concentration and attention are okay No shakes tremors tics Fund of knowledge and intelligence okay  Judgement insight reliability okay  Speech slighly raspy but normal Eye contact and rapport okay  Aims not needed        Psychiatric Specialty Exam: Physical Exam  Review of Systems  Blood pressure 137/73, pulse 61, temperature 98.7 F (37.1 C), temperature source Axillary, resp. rate 18, height 5\' 7"  (1.702 m), weight 68 kg, SpO2 96 %.Body mass index is 23.49 kg/m.                                                          Treatment Plan Summary:   Anxiety due to medical illness Past history of Major depression and generalized anxiety   History of ETOH dependence   Generally low dose klonopin haldol and zyprexa given to help him through his admission   He can later follow up with outpatient psych and or Primary care       Disposition:  Remains in house for now   Eulas Post, MD 12/11/2019 1:45 PM

## 2019-12-11 NOTE — Progress Notes (Signed)
PROGRESS NOTE    Brady Smith  LKG:[id number] DOB: 10/23/1945 DOA: 12/04/2019 PCP: Glean Hess, MD   Subjective:  The patient was seen and examined this morning, accompanied by nursing staff, still agitated, less confused, following command. Reporting he has been eating well, he has had bowel movements  Psych has not seen the patient yet   Hospital course:  Brady Smith is a 74 y.o. male with medical history significant of COPD, hypertension, hyperlipidemia, GERD, alcohol abuse, tobacco abuse, PAF not on anticoagulants, GERD, depression, anxiety, bladder cancer, PVD, who presents with cough and shortness of breath... Patient was subsequently electrocuted respiratory failure due to pneumonia, COPD exacerbation  Subsequently during hospitalization the evening of 12/07/2019 developed some abdominal discomfort, imaging, CT scan was consistent with small bowel obstruction-patient was kept n.p.o., IV fluid bolus reinitiated  He received 2 dose of Covid vaccine.  SARS-CoV-2-negative on this admission Satting 94% on 2 L of oxygen (patient is not O2 dependent at home) chest x-ray showed patchy infiltration in right lower lobe.     Assessment & Plan:   Principal Problem:   CAP (community acquired pneumonia) Active Problems:   SBO (small bowel obstruction) (HCC)   Toxic metabolic encephalopathy   COPD exacerbation ([place])   Essential hypertension   AA (alcohol abuse)   Paroxysmal atrial fibrillation (HCC)   Tobacco abuse   Small bowel obstruction -Resolved having bowel movements, tolerating p.o. - KUB on 12/09/2019 report improvement, still bowel dilatation noted - 12/07/2019, CT scan consistent with small bowel obstruction -Advancing diet today 12/09/2019   Hx of hernia:  -Remained stable, spontaneously reducible ventral vs umbilical hernia. XR abd shows Markedly distended loop of bowel in the mid abdomen, unclear if this reflects a loop of large or small bowel. Several  additional loops of air-filled bowel with questionable fold thickening are noted in the immediate vicinity. Findings raise concern for a bowel obstruction. CT abd/pelvis was reviewed finding consistent with possible small bowel obstruction  CAP: With acute respiratory distress -O2 demand has improved down from 3 L to room air, currently satting 96%  - continue on IV rocephin,  -Discontinue vancomycin >> as repeated cultures remain negative to date  azithromycin x 5 days -discontinued  -Continue IV steroids.... Tapering down Continue on bronchodilators and encourage incentive spirometry. Legionella & strept are neg. 1 of 2 blood cxs growing staph species, likely contaminant.  Repeat blood cxs >>> no growth to date Continue on supplemental oxygen and wean as tolerated  Ruling out bacteremia/sepsis -Sepsis ruled out, SIRS etiology resolved -Repeat blood cultures no growth to date This patient has persistent leukocytosis, WBC 21.3, 20.6, >>> 14.4 >> 12.7 today  procalcitonin 0.10, lactic acid 1.9, -Discontinuing IV vancomycin -Continue Rocephin  Acute respiratory failure likely due to COPD exacerbation/CAP COPD exacerbation:  -Not O2 dependent at home, has been tapered off 3 L of oxygen, currently satting 93% on room air -Status post treatment with vancomycin discontinued 917, azithromycin completed 5 days -We will continue IV Rocephin -Tapering down steroids  -Continue bronchodilators, incentive spirometer    Alcohol abuse: Remained stable  No signs of withdrawal, will discontinue CIWA today 917.  Alcohol cessation counseling.    Hyperkalemia:  Potassium 5.3, 4.9, 3.5 today still elevated but trending down. Lokelma x 1. Hold lisinopril. Will continue to monitor     Acute toxic metabolic encephalopathy -Resolved alert and oriented x2, agitated -Likely due to infection, narcotics, Ativan CIWA protocol Ativan will be discontinued today -We will continue monitor  very closely We  will continue treating underlying cause as above   Agitation/confusion Psych consulted to determine capacity Currently patient is awake alert oriented x3 Demanding to be discharged home -As needed Haldol order, patient has been scheduled on Klonopin   HTN: BP stable,holding home dose of lisinopril secondary to hyperkalemia. IV hydralazine prn   Tobacco abuse: continue on nicotine patch to prevent w/drawal. Smoking cessation counseling  PAF:  Not on anticoagulation for unknown reason, possible fall risk.    DVT prophylaxis: lovenox  Code Status: full  Family Communication: Disposition Plan: likely d/c to SNF    Status is: Inpatient  Remains inpatient appropriate because:IV treatments appropriate due to intensity of illness or inability to take PO, still scoring on CIWA. CT abd/pelvis ordered to r/o obstruction.    Dispo: The patient is from: Home              Anticipated d/c is to: SNF---  [facility] --- which we do not recommend              Anticipated d/c date is: 1-2 days              Patient currently is not medically stable to d/c.   Consultants:     Procedures:    Antimicrobials: rocephin, azithromycin, vancomycin        Objective: Vitals:   12/10/19 1100 12/10/19 2018 12/11/19 0557 12/11/19 1023  BP: 120/65 (!) 150/66 (!) 105/54 137/73  Pulse: 74 61 61   Resp: (!) 22 20 18    Temp: 98.1 F (36.7 C)  98.7 F (37.1 C)   TempSrc: Oral  Axillary   SpO2: 90% 97% 96%   Weight:      Height:        Intake/Output Summary (Last 24 hours) at 12/11/2019 1242 Last data filed at 12/11/2019 0900 Gross per 24 hour  Intake 480 ml  Output --  Net 480 ml   Filed Weights   12/04/19 0621  Weight: 68 kg      Physical Exam:   General:   Awake alert, still agitated this morning  HEENT:  Normocephalic, PERRL, otherwise with in Normal limits   Neuro:  CNII-XII intact. , normal motor and sensation, reflexes intact   Lungs:   Clear to auscultation BL,  Respirations unlabored, no wheezes / crackles  Cardio:    S1/S2, RRR, No murmure, No Rubs or Gallops   Abdomen:   Soft, non-tender, bowel sounds active all four quadrants,  no guarding or peritoneal signs.  Muscular skeletal:  Limited exam - in bed, able to move all 4 extremities, Normal strength,  2+ pulses,  symmetric, No pitting edema  Skin:  Dry, warm to touch, negative for any Rashes, No open wounds  Wounds: Please see nursing documentation              Data Reviewed: I have personally reviewed following labs and imaging studies  CBC: Recent Labs  Lab 12/08/19 0548 12/08/19 0915 12/09/19 0917 12/10/19 0534 12/11/19 0830  WBC 21.3* 20.6* 14.4* 12.7* 13.2*  NEUTROABS  --  19.1* 13.2*  --   --   HGB 17.0 16.7 14.7 12.6* 13.4  HCT 51.0 50.1 46.6 37.5* 40.4  MCV 97.3 95.8 99.8 96.4 95.1  PLT 215 206 139* 141* 818   Basic Metabolic Panel: Recent Labs  Lab 12/07/19 0617 12/08/19 0548 12/09/19 0449 12/10/19 0534 12/11/19 0830  NA 139 140 145 146* 144  K 5.3* 4.9 4.8 3.5 3.2*  CL 98 104 115* 110 110  CO2 31 24 22 27 25   GLUCOSE 175* 149* 127* 99 121*  BUN 44* 43* 42* 31* 33*  CREATININE 1.04 0.93 0.77 0.68 0.74  CALCIUM 10.0 9.6 8.5* 8.3* 8.9  MG  --  2.5*  --   --   --   Sepsis Labs: Recent Labs  Lab 12/08/19 0915 12/08/19 1256 12/09/19 0917  PROCALCITON 0.10  --   --   LATICACIDVEN 1.9 1.7 1.3    Recent Results (from the past 240 hour(s))  SARS Coronavirus 2 by RT PCR (hospital order, performed in [place] hospital lab) Nasopharyngeal Nasopharyngeal Swab     Status: None   Collection Time: 12/04/19  6:25 AM   Specimen: Nasopharyngeal Swab  Result Value Ref Range Status   SARS Coronavirus 2 NEGATIVE NEGATIVE Final    Comment: (NOTE) SARS-CoV-2 target nucleic acids are NOT DETECTED.  The SARS-CoV-2 RNA is generally detectable in upper and lower respiratory specimens during the acute phase of infection. The lowest concentration of SARS-CoV-2 viral  copies this assay can detect is 250 copies / mL. A negative result does not preclude SARS-CoV-2 infection and should not be used as the sole basis for treatment or other patient management decisions.  A negative result may occur with improper specimen collection / handling, submission of specimen other than nasopharyngeal swab, presence of viral mutation(s) within the areas targeted by this assay, and inadequate number of viral copies (<250 copies / mL). A negative result must be combined with clinical observations, patient history, and epidemiological information.  Fact Sheet for Patients:   StrictlyIdeas.no  Fact Sheet for Healthcare Providers: BankingDealers.co.za  This test is not yet approved or  cleared by the [place] FDA and has been authorized for detection and/or diagnosis of SARS-CoV-2 by FDA under an Emergency Use Authorization (EUA).  This EUA will remain in effect (meaning this test can be used) for the duration of the COVID-19 declaration under Section 564(b)(1) of the Act, 21 U.S.C. section 360bbb-3(b)(1), unless the authorization is terminated or revoked sooner.  Performed at [facility], [address] [place], [place] [address]   CULTURE, BLOOD (ROUTINE X 2) w Reflex to ID Panel     Status: Abnormal   Collection Time: 12/04/19 12:35 PM   Specimen: BLOOD  Result Value Ref Range Status   Specimen Description   Final    BLOOD RIGHT [facility] Performed at [facility], [address] [place], [place] [address]    Special Requests   Final    BOTTLES DRAWN AEROBIC AND ANAEROBIC Blood Culture adequate volume Performed at [facility], [address] [place], [place] [address]    Culture  Setup Time   Final    Organism ID to follow [place] ONLY CRITICAL RESULT CALLED TO, READ BACK BY AND VERIFIED WITH: DAVID BESANTI AT 2353 12/05/19 [place] Performed at [facility], [address] [place], [place] [address]    Culture (A)  Final    STAPHYLOCOCCUS HOMINIS THE SIGNIFICANCE OF ISOLATING THIS ORGANISM FROM A SINGLE SET OF BLOOD CULTURES WHEN MULTIPLE SETS ARE DRAWN IS UNCERTAIN. PLEASE NOTIFY THE MICROBIOLOGY DEPARTMENT WITHIN ONE WEEK IF SPECIATION AND SENSITIVITIES ARE REQUIRED. Performed at [facility], [place] [address] [place], [place] [address]    Report Status 12/06/2019 FINAL  Final  CULTURE, BLOOD (ROUTINE X 2) w Reflex to ID Panel     Status: None  Collection Time: 12/04/19 12:35 PM   Specimen: BLOOD  Result Value Ref Range Status   Specimen Description BLOOD RIGHT FORE ARM  Final   Special Requests   Final    BOTTLES DRAWN AEROBIC AND ANAEROBIC Blood Culture adequate volume   Culture   Final    NO GROWTH 5 DAYS Performed at [facility], [address] [place], [place] [address]    Report Status 12/09/2019 FINAL  Final  Blood Culture ID Panel (Reflexed)     Status: Abnormal   Collection Time: 12/04/19 12:35 PM  Result Value Ref Range Status   Enterococcus faecalis NOT DETECTED NOT DETECTED Final   Enterococcus Faecium NOT DETECTED NOT DETECTED Final   Listeria monocytogenes NOT DETECTED NOT DETECTED Final   Staphylococcus species DETECTED (A) NOT DETECTED Final    Comment: CRITICAL RESULT CALLED TO, READ BACK BY AND VERIFIED WITH:  DAVID BESANTI AT 0607 12/05/19 [place]    Staphylococcus aureus (BCID) NOT DETECTED NOT DETECTED Final   Staphylococcus epidermidis NOT DETECTED NOT DETECTED Final   Staphylococcus lugdunensis NOT DETECTED NOT DETECTED Final   Streptococcus species NOT DETECTED NOT DETECTED Final   Streptococcus agalactiae NOT DETECTED NOT DETECTED Final   Streptococcus pneumoniae NOT DETECTED NOT DETECTED Final   Streptococcus pyogenes NOT DETECTED NOT DETECTED Final   A.calcoaceticus-baumannii NOT DETECTED NOT DETECTED Final   Bacteroides fragilis NOT DETECTED NOT DETECTED Final    Enterobacterales NOT DETECTED NOT DETECTED Final   Enterobacter cloacae complex NOT DETECTED NOT DETECTED Final   Escherichia coli NOT DETECTED NOT DETECTED Final   Klebsiella aerogenes NOT DETECTED NOT DETECTED Final   Klebsiella oxytoca NOT DETECTED NOT DETECTED Final   Klebsiella pneumoniae NOT DETECTED NOT DETECTED Final   Proteus species NOT DETECTED NOT DETECTED Final   Salmonella species NOT DETECTED NOT DETECTED Final   Serratia marcescens NOT DETECTED NOT DETECTED Final   Haemophilus influenzae NOT DETECTED NOT DETECTED Final   Neisseria meningitidis NOT DETECTED NOT DETECTED Final   Pseudomonas aeruginosa NOT DETECTED NOT DETECTED Final   Stenotrophomonas maltophilia NOT DETECTED NOT DETECTED Final   Candida albicans NOT DETECTED NOT DETECTED Final   Candida auris NOT DETECTED NOT DETECTED Final   Candida glabrata NOT DETECTED NOT DETECTED Final   Candida krusei NOT DETECTED NOT DETECTED Final   Candida parapsilosis NOT DETECTED NOT DETECTED Final   Candida tropicalis NOT DETECTED NOT DETECTED Final   Cryptococcus neoformans/gattii NOT DETECTED NOT DETECTED Final    Comment: Performed at [facility], [address] [place], [place] [address]  Culture, sputum-assessment     Status: None   Collection Time: 12/05/19 10:05 AM   Specimen: Sputum  Result Value Ref Range Status   Specimen Description SPUTUM  Final   Special Requests NONE  Final   Sputum evaluation   Final    Sputum specimen not acceptable for testing.  Please recollect.   Performed at [facility], [address] [place], [place] [address]    Report Status 12/05/2019 FINAL  Final  CULTURE, BLOOD (ROUTINE X 2) w Reflex to ID Panel     Status: None (Preliminary result)   Collection Time: 12/07/19  8:40 AM   Specimen: BLOOD  Result Value Ref Range Status   Specimen Description BLOOD LEFT ANTECUBITAL  Final   Special Requests   Final    BOTTLES DRAWN AEROBIC AND ANAEROBIC Blood Culture  adequate volume   Culture   Final    NO GROWTH 4 DAYS  Performed at [facility], [address] [place], [place] [address]    Report Status PENDING  Incomplete  CULTURE, BLOOD (ROUTINE X 2) w Reflex to ID Panel     Status: None (Preliminary result)   Collection Time: 12/07/19  8:48 AM   Specimen: BLOOD  Result Value Ref Range Status   Specimen Description BLOOD BLOOD RIGHT HAND  Final   Special Requests   Final    BOTTLES DRAWN AEROBIC AND ANAEROBIC Blood Culture adequate volume   Culture   Final    NO GROWTH 4 DAYS Performed at [facility], [address] [place], [place] [address]    Report Status PENDING  Incomplete  MRSA PCR Screening     Status: None   Collection Time: 12/09/19  1:05 PM   Specimen: Nasopharyngeal  Result Value Ref Range Status   MRSA by PCR NEGATIVE NEGATIVE Final    Comment:        The GeneXpert MRSA Assay (FDA approved for NASAL specimens only), is one component of a comprehensive MRSA colonization surveillance program. It is not intended to diagnose MRSA infection nor to guide or monitor treatment for MRSA infections. Performed at [facility], [address], [place], [place] [address]          Radiology Studies: No results found.      Scheduled Meds: . amLODipine  10 mg Oral Daily  . aspirin EC  81 mg Oral QODAY  . clonazePAM  0.5 mg Oral BID  . docusate sodium  200 mg Oral BID  . folic acid  1 mg Oral Daily  . gabapentin  100 mg Oral QHS  . multivitamin with minerals  1 tablet Oral Daily  . nicotine  14 mg Transdermal Daily  . pantoprazole  40 mg Oral Daily  . predniSONE  50 mg Oral Q breakfast  . thiamine  100 mg Oral Daily   Or  . thiamine  100 mg Intravenous Daily  . triamcinolone ointment  1 application Topical BID   Continuous Infusions: . sodium chloride 10 mL/hr at 12/06/19 1144  . cefTRIAXone (ROCEPHIN)  IV 2 g (12/10/19 1352)     LOS: 6 days    Time spent: 35 mins      Deatra James, MD [facility] Please Amion to page   If 7PM-7AM, please contact night-coverage [email or web address] 12/11/2019, 12:42 PM

## 2019-12-12 ENCOUNTER — Other Ambulatory Visit: Payer: Self-pay | Admitting: Internal Medicine

## 2019-12-12 DIAGNOSIS — L299 Pruritus, unspecified: Secondary | ICD-10-CM

## 2019-12-12 LAB — BASIC METABOLIC PANEL
Anion gap: 10 (ref 5–15)
BUN: 27 mg/dL — ABNORMAL HIGH (ref 8–23)
CO2: 27 mmol/L (ref 22–32)
Calcium: 8.3 mg/dL — ABNORMAL LOW (ref 8.9–10.3)
Chloride: 107 mmol/L (ref 98–111)
Creatinine, Ser: 0.73 mg/dL (ref 0.61–1.24)
GFR calc Af Amer: >60 mL/min (ref >60)
GFR calc non Af Amer: >60 mL/min (ref >60)
Glucose, Bld: 123 mg/dL — ABNORMAL HIGH (ref 70–99)
Potassium: 3.1 mmol/L — ABNORMAL LOW (ref 3.5–5.1)
Sodium: 144 mmol/L (ref 135–145)

## 2019-12-12 LAB — CBC
HCT: 39.7 % (ref 39.0–52.0)
Hemoglobin: 12.7 g/dL — ABNORMAL LOW (ref 13.0–17.0)
MCH: 31.1 pg (ref 26.0–34.0)
MCHC: 32 g/dL (ref 30.0–36.0)
MCV: 97.3 fL (ref 80.0–100.0)
Platelets: 170 10*3/uL (ref 150–400)
RBC: 4.08 MIL/uL — ABNORMAL LOW (ref 4.22–5.81)
RDW: 12.7 % (ref 11.5–15.5)
WBC: 11.9 10*3/uL — ABNORMAL HIGH (ref 4.0–10.5)
nRBC: 0 % (ref 0.0–0.2)

## 2019-12-12 LAB — CULTURE, BLOOD (ROUTINE X 2)
Culture: NO GROWTH
Culture: NO GROWTH
Special Requests: ADEQUATE
Special Requests: ADEQUATE

## 2019-12-12 MED ORDER — SUCRALFATE 1 G PO TABS: 1.0000 g | ORAL_TABLET | Freq: Three times a day (TID) | ORAL | 3 refills | Status: DC

## 2019-12-12 MED ORDER — ADULT MULTIVITAMIN W/MINERALS CH: 1.0000 | ORAL_TABLET | Freq: Every day | ORAL | 3 refills | Status: AC

## 2019-12-12 MED ORDER — ASPIRIN 81 MG PO TBEC
81.0000 mg | DELAYED_RELEASE_TABLET | ORAL | 11 refills | Status: DC
Start: 2019-12-14 — End: 2019-12-27

## 2019-12-12 MED ORDER — GABAPENTIN 100 MG PO CAPS: 100.0000 mg | ORAL_CAPSULE | Freq: Every day | ORAL | 2 refills | Status: DC

## 2019-12-12 MED ORDER — CLONAZEPAM 0.25 MG PO TBDP
0.2500 mg | ORAL_TABLET | Freq: Three times a day (TID) | ORAL | 2 refills | Status: DC
Start: 2019-12-12 — End: 2020-01-03

## 2019-12-12 MED ORDER — NICOTINE 14 MG/24HR TD PT24: 14.0000 mg | MEDICATED_PATCH | Freq: Every day | TRANSDERMAL | 0 refills | Status: DC

## 2019-12-12 MED ORDER — DM-GUAIFENESIN ER 30-600 MG PO TB12: 1.0000 | ORAL_TABLET | Freq: Two times a day (BID) | ORAL | 0 refills | Status: AC | PRN

## 2019-12-12 MED ORDER — AMLODIPINE BESYLATE 10 MG PO TABS
10.0000 mg | ORAL_TABLET | Freq: Every day | ORAL | 2 refills | Status: DC
Start: 2019-12-13 — End: 2019-12-27

## 2019-12-12 MED ORDER — ALBUTEROL SULFATE HFA 108 (90 BASE) MCG/ACT IN AERS: 2.0000 | INHALATION_SPRAY | Freq: Four times a day (QID) | RESPIRATORY_TRACT | 5 refills | Status: DC | PRN

## 2019-12-12 MED ORDER — OLANZAPINE 2.5 MG PO TABS: 1.2500 mg | ORAL_TABLET | Freq: Every day | ORAL | 2 refills | Status: DC

## 2019-12-12 MED ORDER — DOCUSATE SODIUM 100 MG PO CAPS: 100.0000 mg | ORAL_CAPSULE | Freq: Two times a day (BID) | ORAL | 2 refills | Status: DC

## 2019-12-12 NOTE — Progress Notes (Signed)
Brady Smith and O x4. VSS. Pt tolerating diet well. No complaints of nausea or vomiting. IV removed intact, prescriptions given. Pt voices understanding of discharge instructions with no further questions. Patient discharged via wheelchair with RN to taxi  Allergies as of 12/12/2019      Reactions   Influenza Vaccines    High blood pressure- had to be admitted   Lipitor [atorvastatin Calcium]    Muscle pain      Medication List    STOP taking these medications   lisinopril 10 MG tablet Commonly known as: ZESTRIL     TAKE these medications   albuterol 108 (90 Base) MCG/ACT inhaler Commonly known as: VENTOLIN HFA Inhale 2 puffs into the lungs every 6 (six) hours as needed for wheezing or shortness of breath.   amLODipine 10 MG tablet Commonly known as: NORVASC Take 1 tablet (10 mg total) by mouth daily. Start taking on: December 13, 2019   aspirin 81 MG EC tablet Take 1 tablet (81 mg total) by mouth every other day. Swallow whole. Start taking on: December 14, 2019 What changed: additional instructions   clonazePAM 0.25 MG disintegrating tablet Commonly known as: KLONOPIN Take 1 tablet (0.25 mg total) by mouth 3 (three) times daily.   dextromethorphan-guaiFENesin 30-600 MG 12hr tablet Commonly known as: MUCINEX DM Take 1 tablet by mouth 2 (two) times daily as needed for up to 5 days for cough.   docusate sodium 100 MG capsule Commonly known as: COLACE Take 1 capsule (100 mg total) by mouth 2 (two) times daily.   gabapentin 100 MG capsule Commonly known as: NEURONTIN Take 1 capsule (100 mg total) by mouth at bedtime.   ipratropium-albuterol 0.5-2.5 (3) MG/3ML Soln Commonly known as: DUONEB Take 3 mLs by nebulization 3 (three) times daily.   multivitamin with minerals Tabs tablet Take 1 tablet by mouth daily. Start taking on: December 13, 2019   nicotine 14 mg/24hr patch Commonly known as: NICODERM CQ - dosed in mg/24 hours Place 1 patch (14 mg total) onto  the skin daily. Start taking on: December 13, 2019   OLANZapine 2.5 MG tablet Commonly known as: ZYPREXA Take 0.5 tablets (1.25 mg total) by mouth at bedtime.   sucralfate 1 g tablet Commonly known as: CARAFATE Take 1 tablet (1 g total) by mouth 4 (four) times daily -  with meals and at bedtime.   triamcinolone ointment 0.5 % Commonly known as: KENALOG Apply 1 application topically 2 (two) times daily. To rash on leg   VITAMIN B-12 IJ Inject 1,000 mcg into the muscle every 30 (thirty) days.       Vitals:   12/12/19 0917 12/12/19 1123  BP: (!) 143/70 133/74  Pulse:  87  Resp:  18  Temp:  98.3 F (36.8 C)  SpO2:  95%    [name]

## 2019-12-12 NOTE — TOC Transition Note (Addendum)
Transition of Care [facility]) - CM/SW Discharge Note   Patient Details  Name: Brady Smith MRN: [id number] Date of Birth: [birth year]  [facility] CM/SW Contact:  Harriet Masson, RN Phone [place] 12/12/2019, 11:59 AM   Clinical Narrative:    RN spoke with pt at bedside and educated on substance abuse and the risk involved if not monitored. Pt verbalized an understanding and has discussed this topic with his provider in the past. Pt state he no longer "drinks" like he did in the pass. Resources provided for local treatment facility. Also verified pt has a support system in place at the group home. Pt provided a taxi voucher as requested.  Note pt was adamant on his refusal for the recommended SNF from the PT evaluate and requested to go back to his group home. No other issues or barriers noted at this time.  Addendum: Note pt has a primary care provider Halina Maidens at the [facility]. Appointment already arranged.   Final next level of care: Group Home Barriers to Discharge: No Barriers Identified   Patient Goals and CMS Choice Patient states their goals for this hospitalization and ongoing recovery are:: to go back home      Discharge Placement                    Patient and family notified of of transfer: 12/12/19 (Pt aware of discharge today)  Discharge Plan and Services                                     Social Determinants of Health (SDOH) Interventions     Readmission Risk Interventions No flowsheet data found.

## 2019-12-12 NOTE — Discharge Summary (Signed)
Physician Discharge Summary [facility]    Patient: [place] Smith                   Admit date: 12/04/2019   DOB: [birth year]             Discharge date:12/12/2019/10:15 AM [id number]                          PCP: [place] Hess, MD  Disposition: HOME   Recommendations for Outpatient Follow-up:   . Follow up: in 1 week  Discharge Condition: Stable   Code Status:   Code Status: Full Code  Diet recommendation: Regular healthy diet   Discharge Diagnoses:    Principal Problem:   CAP (community acquired pneumonia) Active Problems:   SBO (small bowel obstruction) ([place])   Toxic metabolic encephalopathy   COPD exacerbation ([place])   Essential hypertension   AA (alcohol abuse)   Paroxysmal atrial fibrillation ([place])   Tobacco abuse   History of Present Illness/ Hospital Course [place] Summary:   Hospital course:  [place] Smith [facility] a 74 y.o.malewith medical history significant ofCOPD, hypertension, hyperlipidemia, GERD, alcohol abuse, tobacco abuse, PAF not on anticoagulants, GERD, depression, anxiety, bladder cancer, PVD, who presents with cough and shortness of breath... Patient was subsequently electrocuted respiratory failure due to pneumonia, COPD exacerbation  Subsequently during hospitalization the evening of 12/07/2019 developed some abdominal discomfort, imaging, CT scan was consistent with small bowel obstruction-patient was kept n.p.o., IV fluid bolus reinitiated  Hereceived 2 dose of Covid vaccine. SARS-CoV-2-negative on this admission Satting 94% on 2 L of oxygen (patient is not O2 dependent at home) chest x-ray showed patchy infiltration in right lower lobe.   Hospital course:    Small bowel obstruction -Resolved having bowel movements, tolerating p.o. - KUB on 12/09/2019 report improvement, still bowel dilatation noted - 12/07/2019, CT scan consistent with small bowel obstruction -Advancing diet today 12/09/2019   Hx of hernia:   -Remained stable, spontaneously reducible ventral vs umbilical hernia. XR abd shows Markedly distended loop of bowel in the mid abdomen, unclear if this reflects a loop of large or small bowel. Several additional loops of air-filled bowel with questionable fold thickening are noted in the immediate vicinity. Findings raise concern for a bowel obstruction. CT abd/pelvis was reviewed finding consistent with possible small bowel obstruction  CAP: With acute respiratory distress -O2 demand has improved down from 3 L to room air, currently satting 96%  - continue on IV rocephin,  -Discontinue vancomycin >> as repeated cultures remain negative to date  azithromycin x 5 days -discontinued  -Continue IV steroids.... Tapering down Continue on bronchodilators and encourage incentive spirometry. Legionella & strept are neg. 1 of 2 blood cxs growing staph species, likely contaminant.  Repeat blood cxs >>> no growth to date Continue on supplemental oxygen and wean as tolerated  Ruling out bacteremia/sepsis -Sepsis ruled out, SIRS etiology resolved -Repeat blood cultures no growth to date This patient has persistent leukocytosis, WBC 21.3, 20.6, >>> 14.4 >> 12.7 today  procalcitonin 0.10, lactic acid 1.9, -Discontinuing IV vancomycin -Continue Rocephin  Acute respiratory failure likely due to COPD exacerbation/CAP COPD exacerbation:  -Weaned off oxygen as high as 3 L, currently on room air satting greater 90% -Status post treatment with vancomycin discontinued 917, azithromycin completed 5 days -We will continue IV Rocephin -Tapering down steroids  -Continue bronchodilators, incentive spirometer    Alcohol abuse: Remained stable  No signs of withdrawal,  will discontinue CIWA today 74/17.  Alcohol cessation counseling.    Hyperkalemia:  Potassium 5.3, 4.9, 3.5 today still elevated but trending down. Lokelma x 1. Hold lisinopril. Will continue to monitor     Acute toxic metabolic  encephalopathy -Resolved -Likely due to infection, narcotics, Ativan CIWA protocol Ativan will be discontinued today -We will continue monitor very closely We will continue treating underlying cause as above   Agitation/confusion Psych consulted to determine capacity Initiated medication including Zyprexa, Klonopin As needed Haldol   HTN:  Lisinopril was DC'd, initiated Norvasc  Tobacco abuse: continue on nicotine patch to prevent w/drawal. Smoking cessation counseling  PAF:  Not on anticoagulation for unknown reason, possible fall risk.    DVT prophylaxis: lovenox  Code Status: full  Family Communication: Disposition Plan: likely d/c to SNF    Status is: Inpatient  Remains inpatient appropriate because:IV treatments appropriate due to intensity of illness or inability to take PO, still scoring on CIWA. CT abd/pelvis ordered to r/o obstruction.    Dispo: The patient is from: Home  Anticipated d/c is to: SNF---   Apartment --- which we do not recommend    Discharge Instructions:   Discharge Instructions    Activity as tolerated - No restrictions   Complete by: As directed    Call MD for:  difficulty breathing, headache or visual disturbances   Complete by: As directed    Call MD for:  persistant dizziness or light-headedness   Complete by: As directed    Call MD for:  persistant nausea and vomiting   Complete by: As directed    Call MD for:  temperature >100.4   Complete by: As directed    Diet - low sodium heart healthy   Complete by: As directed    Discharge instructions   Complete by: As directed    F/up with PCP in 1 wk   Increase activity slowly   Complete by: As directed        Medication List    STOP taking these medications   lisinopril 10 MG tablet Commonly known as: ZESTRIL     TAKE these medications   albuterol 108 (90 Base) MCG/ACT inhaler Commonly known as: VENTOLIN HFA Inhale 2 puffs into the lungs every 6  (six) hours as needed for wheezing or shortness of breath.   amLODipine 10 MG tablet Commonly known as: NORVASC Take 1 tablet (10 mg total) by mouth daily. Start taking on: December 13, 2019   aspirin 81 MG EC tablet Take 1 tablet (81 mg total) by mouth every other day. Swallow whole. Start taking on: December 14, 2019 What changed: additional instructions   clonazePAM 0.25 MG disintegrating tablet Commonly known as: KLONOPIN Take 1 tablet (0.25 mg total) by mouth 3 (three) times daily.   dextromethorphan-guaiFENesin 30-600 MG 12hr tablet Commonly known as: MUCINEX DM Take 1 tablet by mouth 2 (two) times daily as needed for up to 5 days for cough.   docusate sodium 100 MG capsule Commonly known as: COLACE Take 1 capsule (100 mg total) by mouth 2 (two) times daily.   gabapentin 100 MG capsule Commonly known as: NEURONTIN Take 1 capsule (100 mg total) by mouth at bedtime.   ipratropium-albuterol 0.5-2.5 (3) MG/3ML Soln Commonly known as: DUONEB Take 3 mLs by nebulization 3 (three) times daily.   multivitamin with minerals Tabs tablet Take 1 tablet by mouth daily. Start taking on: December 13, 2019   nicotine 14 mg/24hr patch Commonly known as: NICODERM CQ -  dosed in mg/24 hours Place 1 patch (14 mg total) onto the skin daily. Start taking on: December 13, 2019   OLANZapine 2.5 MG tablet Commonly known as: ZYPREXA Take 0.5 tablets (1.25 mg total) by mouth at bedtime.   sucralfate 1 g tablet Commonly known as: CARAFATE Take 1 tablet (1 g total) by mouth 4 (four) times daily -  with meals and at bedtime.   triamcinolone ointment 0.5 % Commonly known as: KENALOG Apply 1 application topically 2 (two) times daily. To rash on leg   VITAMIN B-12 IJ Inject 1,000 mcg into the muscle every 30 (thirty) days.       Allergies  Allergen Reactions  . Influenza Vaccines     High blood pressure- had to be admitted  . Lipitor [Atorvastatin Calcium]     Muscle pain      Procedures /Studies:   CT ABDOMEN PELVIS WO CONTRAST  Result Date: 12/07/2019 CLINICAL DATA:  Abdominal pain EXAM: CT ABDOMEN AND PELVIS WITHOUT CONTRAST TECHNIQUE: Multidetector CT imaging of the abdomen and pelvis was performed following the standard protocol without IV contrast. Oral contrast was administered. COMPARISON:  Abdominal radiographs December 06, 2019; CT abdomen and pelvis May 27, 2015 FINDINGS: Lower chest: There is consolidation in the right base. There is also a small area of consolidation in the inferior, lateral aspect of the right middle lobe. There are foci of coronary artery calcification. Hepatobiliary: No focal liver lesions are evident. Gallbladder is absent. There is no appreciable biliary duct dilatation. Pancreas: There is no pancreatic mass or inflammatory focus. Spleen: No splenic lesions are evident. Adrenals/Urinary Tract: Adrenals bilaterally appear normal. There is no renal mass or hydronephrosis on either side. There is an extrarenal pelvis on the right, an anatomic variant. There is a 1 mm calculus in the mid right kidney. There is no ureteral calculus on either side. Urinary bladder is midline with wall thickness within normal limits. Stomach/Bowel: Stomach is filled with fluid. There is small bowel dilatation. There is evidence of there is a focal loop of markedly dilated small bowel in the anterior mid abdomen which contains oral contrast. Immediately distal to this localized marked bowel dilatation, there is a transition zone indicating site of small-bowel obstruction. There is contrast which passes through this area. There is no appreciable distal small bowel obstruction. The terminal ileum appears normal. There is fatty infiltration in the ileocecal valve. There is no appreciable colonic dilatation. There is moderate stool in the colon. There is no appreciable free air or portal venous air. Vascular/Lymphatic: There is no abdominal aortic aneurysm. There is  localized dilatation of the distal abdominal aorta measuring 3.6 x 3.2 cm with localized saccular dilatation in this area, also present on previous study with slightly greater dilatation compared to the previous study. There is extensive aortic and iliac artery atherosclerosis. Multiple more distal pelvic arterial calcifications are noted. No adenopathy is appreciable in the abdomen or pelvis. Reproductive: Prostate absent.  Seminal vesicles absent. Other: No periappendiceal region inflammation. There is a lower abdominal ventral hernia which contains loops of small bowel. The loop of markedly dilated proximal ileum is seen in this area of herniation; the transition zone does not occur as a consequence of this hernia, however. More superiorly, there is a midline ventral hernia containing fat but no bowel. This hernia measures 4.7 cm from right to left dimension. There is no abscess or ascites in the abdomen or pelvis. Musculoskeletal: Pars defects are noted at L5 bilaterally. There is 5  mm of anterolisthesis of L5 on S1. No blastic or lytic bone lesions. No intramuscular lesions are evident. IMPRESSION: 1. Small bowel obstruction with transition zone in the proximal to mid ileal region. A loop of markedly dilated small bowel is noted immediately proximal to the transition zone. This loop of small bowel has a maximum transverse diameter of 10.2 cm. This loop of bowel corresponds to the marked focal dilatation on radiograph from 1 day prior. Note that there is no colonic dilatation. No free air. 2. Midline ventral hernias. The larger hernia contains multiple loops of bowel. The dilated loop of proximal ileum immediately proximal to the transition zone is noted within this hernia. However, the transition zone does not appear to represent a consequence of the hernia. There is a smaller midline ventral hernia containing fat but no bowel. 3. Areas of apparent pneumonia in the right middle lobe and right lower lobe  regions. 4. Dilatation of the distal aorta with a measured transverse diameter of 3.6 x 3.2 cm, slightly increased from 2017 study. This area has a saccular appearance. Recommend referral to a vascular specialist. This recommendation follows ACR consensus guidelines: White Paper of the ACR Incidental Findings Committee II on Vascular Findings. J Am Coll Radiol 2013; 10:789-794. This recommendation follows ACR consensus guidelines: White Paper of the ACR Incidental Findings Committee II on Vascular Findings. J Am Coll Radiol 2013; 10:789-794. this recommendation follows ACR consensus guidelines: White Paper of the ACR Incidental Findings Committee II on Vascular Findings. J Am Coll Radiol 2013; 10:789-794. 5. Pars defects at L5 bilaterally with 5 mm of anterolisthesis of L5 on S1. 6. Gallbladder absent. Prostate absent. These results will be called to the ordering clinician or representative by the Radiologist Assistant, and communication documented in the PACS or [employer]. Electronically Signed   By: Lowella Grip III M.D.   On: 12/07/2019 18:22   DG Chest 1 View  Result Date: 12/04/2019 CLINICAL DATA:  Pt with shob,chest pain and covid exposure. Pt with history of copd. Current smoker. shob, maybe covid exposure EXAM: CHEST  1 VIEW COMPARISON:  11/20/2019 FINDINGS: Patient rotated rightward. Normal cardiac silhouette. Potential patchy airspace disease in the RIGHT lower lobe. LEFT lung relatively clear. No pneumothorax. IMPRESSION: Patchy airspace disease in the RIGHT lower lobe could represent early pneumonia. Electronically Signed   By: Suzy Bouchard M.D.   On: 12/04/2019 07:37   DG Chest 2 View  Result Date: 11/20/2019 CLINICAL DATA:  Chest pain.  History of COVID infection. EXAM: CHEST - 2 VIEW COMPARISON:  01/23/17 FINDINGS: Heart size appears normal. No pleural effusion or edema. No airspace consolidation. Coarsened reticular scratch set coarsened interstitial markings are noted  bilaterally which appear progressive compared with remote exam from 2018. No superimposed airspace consolidation. Spondylosis identified in the thoracic spine. IMPRESSION: 1. No acute cardiopulmonary abnormalities. 2. Chronic interstitial coarsening. Electronically Signed   By: Kerby Moors M.D.   On: 11/20/2019 10:57   DG Abd 1 View  Result Date: 12/09/2019 CLINICAL DATA:  Small bowel obstruction EXAM: ABDOMEN - 1 VIEW COMPARISON:  December 06, 2019; CT abdomen and pelvis December 07, 2019 FINDINGS: There remains a loop of dilated bowel stay right of midline in the mid abdomen which on previous CT examination which shown to represent a loop of small bowel. No other appreciable bowel dilatation evident. No air-fluid levels. No free air. Extensive arterial vascular calcification noted. There are surgical clips in the pelvis. There is patchy opacity in the right lung  base. Lung bases otherwise clear. IMPRESSION: Loop of dilated bowel in the mid abdomen remains. No other appreciable bowel dilatation. No free air. Apparent persistent infiltrate right lung base. Electronically Signed   By: Lowella Grip III M.D.   On: 12/09/2019 09:14   DG Abd Portable 1V  Result Date: 12/06/2019 CLINICAL DATA:  Cough, shortness of breath EXAM: PORTABLE ABDOMEN - 1 VIEW COMPARISON:  Radiograph 05/20/2015 FINDINGS: Markedly distended loop of bowel seen in the mid abdomen, unclear if this reflects a loop of large or small bowel. Several additional loops of air-filled bowel with questionable fold thickening are noted in the immediate vicinity. Air and stool projects over the rectal vault. Moderate proximal colonic stool burden is noted. Cholecystectomy clips in the right upper quadrant. Vascular stenting, calcium and additional surgical clips throughout the pelvis. Degenerative changes in the spine, hips and pelvis. IMPRESSION: 1. Markedly distended loop of bowel in the mid abdomen, unclear if this reflects a loop of large  or small bowel. Several additional loops of air-filled bowel with questionable fold thickening are noted in the immediate vicinity. Findings raise concern for a bowel obstruction. Consider further evaluation with CT. Electronically Signed   By: Lovena Le M.D.   On: 12/06/2019 16:01    Subjective:   Patient was seen and examined 12/12/2019, 10:15 AM Patient stable today. No acute distress.  No issues overnight Stable for discharge.  Discharge Exam:    Vitals:   12/11/19 2053 12/12/19 0629 12/12/19 0720 12/12/19 0917  BP: (!) 150/94 (!) 151/77 118/62 (!) 143/70  Pulse: 76 (!) 53 85   Resp: 20 18 16    Temp: 98.5 F (36.9 C)  97.7 F (36.5 C)   TempSrc: Oral  Oral   SpO2: 95% (!) 87% 96%   Weight:      Height:        General: Pt lying comfortably in bed & appears in no obvious distress. Cardiovascular: S1 & S2 heard, RRR, S1/S2 +. No murmurs, rubs, gallops or clicks. No JVD or pedal edema. Respiratory: Clear to auscultation without wheezing, rhonchi or crackles. No increased work of breathing. Abdominal:  Non-distended, non-tender & soft. No organomegaly or masses appreciated. Normal bowel sounds heard. CNS: Alert and oriented. No focal deficits. Extremities: no edema, no cyanosis    The results of significant diagnostics from this hospitalization (including imaging, microbiology, ancillary and laboratory) are listed below for reference.      Microbiology:   Recent Results (from the past 240 hour(s))  SARS Coronavirus 2 by RT PCR (hospital order, performed in [facility] hospital lab) Nasopharyngeal Nasopharyngeal Swab     Status: None   Collection Time: 12/04/19  6:25 AM   Specimen: Nasopharyngeal Swab  Result Value Ref Range Status   SARS Coronavirus 2 NEGATIVE NEGATIVE Final    Comment: (NOTE) SARS-CoV-2 target nucleic acids are NOT DETECTED.  The SARS-CoV-2 RNA is generally detectable in upper and lower respiratory specimens during the acute phase of infection.  The lowest concentration of SARS-CoV-2 viral copies this assay can detect is 250 copies / mL. A negative result does not preclude SARS-CoV-2 infection and should not be used as the sole basis for treatment or other patient management decisions.  A negative result may occur with improper specimen collection / handling, submission of specimen other than nasopharyngeal swab, presence of viral mutation(s) within the areas targeted by this assay, and inadequate number of viral copies (<250 copies / mL). A negative result must be combined with clinical  observations, patient history, and epidemiological information.  Fact Sheet for Patients:   StrictlyIdeas.no  Fact Sheet for Healthcare Providers: BankingDealers.co.za  This test is not yet approved or  cleared by the [place] FDA and has been authorized for detection and/or diagnosis of SARS-CoV-2 by FDA under an Emergency Use Authorization (EUA).  This EUA will remain in effect (meaning this test can be used) for the duration of the COVID-19 declaration under Section 564(b)(1) of the Act, 21 U.S.C. section 360bbb-3(b)(1), unless the authorization is terminated or revoked sooner.  Performed at [facility], [address] [place], [place] [address]   CULTURE, BLOOD (ROUTINE X 2) w Reflex to ID Panel     Status: Abnormal   Collection Time: 12/04/19 12:35 PM   Specimen: BLOOD  Result Value Ref Range Status   Specimen Description   Final    BLOOD RIGHT [facility] Performed at [facility], [address] [place], [place] [address]    Special Requests   Final    BOTTLES DRAWN AEROBIC AND ANAEROBIC Blood Culture adequate volume Performed at [facility], [address] [place], [place] [address]    Culture  Setup Time   Final    Organism ID to follow Daniel ONLY CRITICAL RESULT CALLED TO, READ BACK BY AND VERIFIED WITH: DAVID  BESANTI AT 5102 12/05/19 [place] Performed at [facility], [address] [place], [place] [address]    Culture (A)  Final    STAPHYLOCOCCUS HOMINIS THE SIGNIFICANCE OF ISOLATING THIS ORGANISM FROM A SINGLE SET OF BLOOD CULTURES WHEN MULTIPLE SETS ARE DRAWN IS UNCERTAIN. PLEASE NOTIFY THE MICROBIOLOGY DEPARTMENT WITHIN ONE WEEK IF SPECIATION AND SENSITIVITIES ARE REQUIRED. Performed at [facility], [place] [address], [place], [place] [address]    Report Status 12/06/2019 FINAL  Final  CULTURE, BLOOD (ROUTINE X 2) w Reflex to ID Panel     Status: None   Collection Time: 12/04/19 12:35 PM   Specimen: BLOOD  Result Value Ref Range Status   Specimen Description BLOOD RIGHT FORE ARM  Final   Special Requests   Final    BOTTLES DRAWN AEROBIC AND ANAEROBIC Blood Culture adequate volume   Culture   Final    NO GROWTH 5 DAYS Performed at [facility], [address] [place], [place] [address]    Report Status 12/09/2019 FINAL  Final  Blood Culture ID Panel (Reflexed)     Status: Abnormal   Collection Time: 12/04/19 12:35 PM  Result Value Ref Range Status   Enterococcus faecalis NOT DETECTED NOT DETECTED Final   Enterococcus Faecium NOT DETECTED NOT DETECTED Final   Listeria monocytogenes NOT DETECTED NOT DETECTED Final   Staphylococcus species DETECTED (A) NOT DETECTED Final    Comment: CRITICAL RESULT CALLED TO, READ BACK BY AND VERIFIED WITH:  DAVID BESANTI AT 0607 12/05/19 SDR    Staphylococcus aureus (BCID) NOT DETECTED NOT DETECTED Final   Staphylococcus epidermidis NOT DETECTED NOT DETECTED Final   Staphylococcus lugdunensis NOT DETECTED NOT DETECTED Final   Streptococcus species NOT DETECTED NOT DETECTED Final   Streptococcus agalactiae NOT DETECTED NOT DETECTED Final   Streptococcus pneumoniae NOT DETECTED NOT DETECTED Final   Streptococcus pyogenes NOT DETECTED NOT DETECTED Final   A.calcoaceticus-baumannii NOT DETECTED NOT DETECTED Final   Bacteroides  fragilis NOT DETECTED NOT DETECTED Final   Enterobacterales NOT DETECTED NOT DETECTED Final   Enterobacter cloacae complex NOT DETECTED NOT DETECTED Final   Escherichia  coli NOT DETECTED NOT DETECTED Final   Klebsiella aerogenes NOT DETECTED NOT DETECTED Final   Klebsiella oxytoca NOT DETECTED NOT DETECTED Final   Klebsiella pneumoniae NOT DETECTED NOT DETECTED Final   Proteus species NOT DETECTED NOT DETECTED Final   Salmonella species NOT DETECTED NOT DETECTED Final   Serratia marcescens NOT DETECTED NOT DETECTED Final   Haemophilus influenzae NOT DETECTED NOT DETECTED Final   Neisseria meningitidis NOT DETECTED NOT DETECTED Final   Pseudomonas aeruginosa NOT DETECTED NOT DETECTED Final   Stenotrophomonas maltophilia NOT DETECTED NOT DETECTED Final   Candida albicans NOT DETECTED NOT DETECTED Final   Candida auris NOT DETECTED NOT DETECTED Final   Candida glabrata NOT DETECTED NOT DETECTED Final   Candida krusei NOT DETECTED NOT DETECTED Final   Candida parapsilosis NOT DETECTED NOT DETECTED Final   Candida tropicalis NOT DETECTED NOT DETECTED Final   Cryptococcus neoformans/gattii NOT DETECTED NOT DETECTED Final    Comment: Performed at [facility], [address] [place], [place] [address]  Culture, sputum-assessment     Status: None   Collection Time: 12/05/19 10:05 AM   Specimen: Sputum  Result Value Ref Range Status   Specimen Description SPUTUM  Final   Special Requests NONE  Final   Sputum evaluation   Final    Sputum specimen not acceptable for testing.  Please recollect.   Performed at [facility], [address] [place], [place] [address]    Report Status 12/05/2019 FINAL  Final  CULTURE, BLOOD (ROUTINE X 2) w Reflex to ID Panel     Status: None   Collection Time: 12/07/19  8:40 AM   Specimen: BLOOD  Result Value Ref Range Status   Specimen Description BLOOD LEFT ANTECUBITAL  Final   Special Requests   Final    BOTTLES DRAWN AEROBIC AND  ANAEROBIC Blood Culture adequate volume   Culture   Final    NO GROWTH 5 DAYS Performed at [facility], [address] [place], [place] [address]    Report Status 12/12/2019 FINAL  Final  CULTURE, BLOOD (ROUTINE X 2) w Reflex to ID Panel     Status: None   Collection Time: 12/07/19  8:48 AM   Specimen: BLOOD  Result Value Ref Range Status   Specimen Description BLOOD BLOOD RIGHT HAND  Final   Special Requests   Final    BOTTLES DRAWN AEROBIC AND ANAEROBIC Blood Culture adequate volume   Culture   Final    NO GROWTH 5 DAYS Performed at [facility], [address] [place], [place] [address]    Report Status 12/12/2019 FINAL  Final  MRSA PCR Screening     Status: None   Collection Time: 12/09/19  1:05 PM   Specimen: Nasopharyngeal  Result Value Ref Range Status   MRSA by PCR NEGATIVE NEGATIVE Final    Comment:        The GeneXpert MRSA Assay (FDA approved for NASAL specimens only), is one component of a comprehensive MRSA colonization surveillance program. It is not intended to diagnose MRSA infection nor to guide or monitor treatment for MRSA infections. Performed at [facility], [address] [place], [place] [address]      Labs:   CBC: Recent Labs  Lab 12/08/19 0915 12/09/19 0917 12/10/19 0534 12/11/19 0830 12/12/19 0615  WBC 20.6* 14.4* 12.7* 13.2* 11.9*  NEUTROABS 19.1* 13.2*  --   --   --   HGB 16.7 14.7 12.6* 13.4 12.7*  HCT 50.1 46.6 37.5* 40.4 39.7  MCV 95.8 99.8 96.4 95.1 97.3  PLT 206 139* 141* 180 224   Basic Metabolic Panel: Recent Labs  Lab 12/08/19 0548 12/09/19 0449 12/10/19 0534 12/11/19 0830 12/12/19 0615  NA 140 145 146* 144 144  K 4.9 4.8 3.5 3.2* 3.1*  CL 104 115* 110 110 107  CO2 24 22 27 25 27   GLUCOSE 149* 127* 99 121* 123*  BUN 43* 42* 31* 33* 27*  CREATININE 0.93 0.77 0.68 0.74 0.73  CALCIUM 9.6 8.5* 8.3* 8.9 8.3*  MG 2.5*  --   --   --   --    Liver Function Tests: No results for  input(s): AST, ALT, ALKPHOS, BILITOT, PROT, ALBUMIN in the last 168 hours. BNP (last 3 results) No results for input(s): BNP in the last 8760 hours. Cardiac Enzymes: No results for input(s): CKTOTAL, CKMB, CKMBINDEX, TROPONINI in the last 168 hours. CBG: No results for input(s): GLUCAP in the last 168 hours. Hgb A1c No results for input(s): HGBA1C in the last 72 hours. Lipid Profile No results for input(s): CHOL, HDL, LDLCALC, TRIG, CHOLHDL, LDLDIRECT in the last 72 hours. Thyroid function studies No results for input(s): TSH, T4TOTAL, T3FREE, THYROIDAB in the last 72 hours.  Invalid input(s): FREET3 Anemia work up No results for input(s): VITAMINB12, FOLATE, FERRITIN, TIBC, IRON, RETICCTPCT in the last 72 hours. Urinalysis    Component Value Date/Time   COLORURINE YELLOW (A) 05/22/2015 0925   APPEARANCEUR CLEAR (A) 05/22/2015 0925   APPEARANCEUR Hazy 07/27/2013 1041   LABSPEC 1.015 05/22/2015 0925   LABSPEC 1.010 07/27/2013 1041   PHURINE 6.0 05/22/2015 0925   GLUCOSEU NEGATIVE 05/22/2015 0925   GLUCOSEU Negative 07/27/2013 1041   HGBUR 1+ (A) 05/22/2015 0925   BILIRUBINUR NEGATIVE 05/22/2015 0925   BILIRUBINUR Negative 07/27/2013 1041   KETONESUR TRACE (A) 05/22/2015 0925   PROTEINUR NEGATIVE 05/22/2015 0925   NITRITE NEGATIVE 05/22/2015 0925   LEUKOCYTESUR NEGATIVE 05/22/2015 0925   LEUKOCYTESUR Negative 07/27/2013 1041         Time coordinating discharge: Over 45 minutes  SIGNED: Deatra James, MD, FACP, FHM. [facility],  Please use [email or web address] to Page If 7PM-7AM, please contact night-coverage [email or web address], Password [facility] 12/12/2019, 10:15 AM

## 2019-12-13 ENCOUNTER — Telehealth: Payer: Self-pay

## 2019-12-13 NOTE — Telephone Encounter (Signed)
Attempted to reach patient for TCM call and confirm hospital follow up appt. Unable to leave msg due to voicemail not set up.

## 2019-12-14 ENCOUNTER — Telehealth: Payer: Self-pay | Admitting: Internal Medicine

## 2019-12-14 NOTE — Telephone Encounter (Signed)
NO ANSWER/NO VM. Pt due to schedule Medicare Annual Wellness Visit (AWV) either virtually/audio only or in office. Whichever the patients preference is.  Last AWV 07/10/17; please schedule at anytime with [facility] Health Advisor.  This should be a 40 minute visit.

## 2019-12-15 NOTE — Telephone Encounter (Signed)
Second attempt to reach patient for TCM and confirm appt. Unable to leave message.

## 2019-12-16 ENCOUNTER — Ambulatory Visit: Payer: Medicare Other | Admitting: Internal Medicine

## 2019-12-16 NOTE — Progress Notes (Deleted)
Date:  12/16/2019   Name:  Brady Smith   DOB:  Aug 05, 1945   MRN:  [id number]   Chief Complaint: No chief complaint on file. Hospital follow up.  Admitted to [facility] 12/04/19 to 12/12/19.  Received TOC call on 12/13/19.  Hospital course by problem:  Small bowel obstruction -Resolved having bowel movements, tolerating p.o. - KUB on 12/09/2019 report improvement, still bowel dilatation noted - 12/07/2019, CT scan consistent with small bowel obstruction -Advancing diet today 12/09/2019   Hx of hernia: -Remained stable, spontaneously reducible ventral vs umbilical hernia. XR abd shows Markedly distended loop of bowel in the mid abdomen, unclear if this reflects a loop of large or small bowel. Several additional loops of air-filled bowel with questionable fold thickening are noted in the immediate vicinity. Findings raise concern for a bowel obstruction.CT abd/pelvis was reviewed finding consistent with possible small bowel obstruction  YIF:OYDX acute respiratory distress -O2 demand has improved down from 3 L to room air, currently satting 96% - continue on IV rocephin,  -Discontinue vancomycin >> as repeated cultures remain negative to date azithromycin x 5 days -discontinued  -Continue IV steroids.... Tapering down Continue on bronchodilators and encourage incentive spirometry. Legionella &strept are neg. 1 of 2 blood cxs growing staph species, likely contaminant.  Repeat blood cxs >>> no growth to date Continue on supplemental oxygen and wean as tolerated  Ruling out bacteremia/sepsis -Sepsis ruled out, SIRS etiology resolved -Repeat blood cultures no growth to date This patient has persistent leukocytosis, WBC 21.3, 20.6, >>> 14.4 >> 12.7 today procalcitonin 0.10, lactic acid 1.9, -Discontinuing IV vancomycin -Continue Rocephin  Acute respiratory failure likely due to COPD exacerbation/CAP COPD exacerbation: -Weaned off oxygen as high as 3 L, currently on room air  satting greater 90% -Status post treatment with vancomycin discontinued 917, azithromycin completed 5 days -We will continue IV Rocephin -Tapering down steroids  -Continue bronchodilators, incentive spirometer   Alcohol abuse: Remained stable No signs of withdrawal, will discontinue CIWA today 917.  Alcohol cessation counseling.    Hyperkalemia: Potassium 5.3, 4.9, 3.5 today still elevated but trending down. Lokelma x 1. Hold lisinopril. Will continue to monitor    Acute toxic metabolic encephalopathy -Resolved -Likely due to infection, narcotics, Ativan CIWA protocol Ativan will be discontinued today -We will continue monitor very closely We will continue treating underlying cause as above   Agitation/confusion Psych consulted to determine capacity Initiated medication including Zyprexa, Klonopin As needed Haldol   HTN: Lisinopril was DC'd, initiated Norvasc  Tobacco abuse:continue on nicotine patch to prevent w/drawal. Smoking cessation counseling  PAF: Not on anticoagulation for unknown reason, possible fall risk.    Immunization History  Administered Date(s) Administered  . Influenza, High Dose Seasonal PF 01/23/2017  . PFIZER SARS-COV-2 Vaccination 10/05/2019, 11/08/2019  . Pneumococcal Conjugate-13 02/25/2018  . Pneumococcal Polysaccharide-23 01/24/2017     Lab Results  Component Value Date   CREATININE 0.73 12/12/2019   BUN 27 (H) 12/12/2019   NA 144 12/12/2019   K 3.1 (L) 12/12/2019   CL 107 12/12/2019   CO2 27 12/12/2019   Lab Results  Component Value Date   CHOL 159 08/02/2019   HDL 43 08/02/2019   LDLCALC 87 08/02/2019   TRIG 165 (H) 08/02/2019   CHOLHDL 3.7 08/02/2019   Lab Results  Component Value Date   TSH 1.100 08/02/2019   Lab Results  Component Value Date   HGBA1C 5.3 06/13/2017   Lab Results  Component Value Date   WBC  11.9 (H) 12/12/2019   HGB 12.7 (L) 12/12/2019   HCT 39.7 12/12/2019   MCV 97.3  12/12/2019   PLT 170 12/12/2019   Lab Results  Component Value Date   ALT 14 11/20/2019   AST 18 11/20/2019   ALKPHOS 56 11/20/2019   BILITOT 1.1 11/20/2019     Review of Systems  Patient Active Problem List   Diagnosis Date Noted  . CAP (community acquired pneumonia) 12/04/2019  . Tobacco abuse 12/04/2019  . Dermatitis 11/24/2019  . Senile ecchymosis 08/02/2019  . B12 nutritional deficiency 06/07/2019  . Memory changes 04/24/2018  . Excoriation of scalp 04/24/2018  . Paroxysmal atrial fibrillation ([place]) 06/13/2017  . Elevated blood sugar 02/03/2017  . S/P amputation of lesser toe ([place]) 07/25/2015  . Peripheral vascular disease ([place]) 07/14/2015  . Toxic metabolic encephalopathy 48/88/9169  . AA (alcohol abuse) 05/23/2015  . SBO (small bowel obstruction) ([place]) 05/18/2015  . Urinary retention 05/18/2015  . Essential hypertension 05/18/2015  . Choroidal nevus, right eye 02/08/2015  . Carpal tunnel syndrome 01/13/2015  . H/O gastrointestinal disease 01/13/2015  . History of primary malignant neoplasm of urinary bladder 01/13/2015  . Compulsive tobacco user syndrome 01/13/2015  . HLD (hyperlipidemia) 01/13/2015  . Neurosis, phobic 01/13/2015  . COPD exacerbation ([place]) 06/11/2011  . Lung nodule, solitary 06/11/2011    Allergies  Allergen Reactions  . Influenza Vaccines     High blood pressure- had to be admitted  . Lipitor [Atorvastatin Calcium]     Muscle pain    Past Surgical History:  Procedure Laterality Date  . AMPUTATION Right 07/19/2015   Procedure: AMPUTATION DIGIT ( RIGHT FOOT FIFTH TOE );  Surgeon: Algernon Huxley, MD;  Location: [facility];  Service: Vascular;  Laterality: Right;  . BOWEL RESECTION  05/20/2015   Procedure: SMALL BOWEL RESECTION;  Surgeon: Clayburn Pert, MD;  Location: [facility];  Service: General;;  . CATARACT EXTRACTION W/PHACO Left 10/06/2018   Procedure: CATARACT EXTRACTION PHACO AND INTRAOCULAR LENS PLACEMENT ([place])  LEFT;  Surgeon: Birder Robson, MD;  Location: [place];  Service: Ophthalmology;  Laterality: Left;  DAY OF TEST. LIVES IN BOARDING HOUSE  . CATARACT EXTRACTION W/PHACO Right 10/27/2018   Procedure: CATARACT EXTRACTION PHACO AND INTRAOCULAR LENS PLACEMENT ([place])  RIGHT;  Surgeon: Birder Robson, MD;  Location: [place];  Service: Ophthalmology;  Laterality: Right;  PT HAS TO HAVE COVID TEST MORNING OF LEAVE AT LAST PATIENT  . CHOLECYSTECTOMY    . COLONOSCOPY  2000  . CYSTECTOMY W/ CONTINENT DIVERSION  1996  . HERNIA REPAIR    . LAPAROTOMY N/A 05/20/2015   Procedure: EXPLORATORY LAPAROTOMY;  Surgeon: Clayburn Pert, MD;  Location: [facility];  Service: General;  Laterality: N/A;  . PERIPHERAL VASCULAR CATHETERIZATION N/A 07/03/2015   Procedure: Abdominal Aortogram w/Lower Extremity;  Surgeon: Algernon Huxley, MD;  Location: [place] [facility];  Service: Cardiovascular;  Laterality: N/A;  . PERIPHERAL VASCULAR CATHETERIZATION  07/03/2015   Procedure: Lower Extremity Intervention;  Surgeon: Algernon Huxley, MD;  Location: [place] [facility];  Service: Cardiovascular;;  . PROSTATECTOMY  1996  . TONSILLECTOMY      Social History   Tobacco Use  . Smoking status: Current Every Day Smoker    Packs/day: 0.25    Years: 65.00    Pack years: 16.25    Types: Cigarettes  . Smokeless tobacco: Never Used  . Tobacco comment: reduced # of packs smoked to 5-6 ciggs daily from 3 pks daily  Vaping Use  . Vaping Use: Never used  Substance Use Topics  . Alcohol use: Yes    Alcohol/week: 12.0 standard drinks    Types: 12 Cans of beer per week  . Drug use: No     Medication list has been reviewed and updated.  No outpatient medications have been marked as taking for the 12/16/19 encounter (Appointment) with Glean Hess, MD.    [facility] 2/9 Scores 06/07/2019 04/07/2019 11/11/2018 04/24/2018  PHQ - 2 Score 4 2 0 0  PHQ- 9 Score 5 2 - -    No flowsheet data found.  BP Readings from Last 3 Encounters:    12/12/19 133/74  11/24/19 120/74  11/20/19 (!) 146/86    Physical Exam  Wt Readings from Last 3 Encounters:  12/04/19 150 lb (68 kg)  11/24/19 161 lb (73 kg)  11/20/19 165 lb (74.8 kg)    There were no vitals taken for this visit.  Assessment and Plan:

## 2019-12-18 ENCOUNTER — Other Ambulatory Visit: Payer: Self-pay

## 2019-12-18 ENCOUNTER — Encounter: Payer: Self-pay | Admitting: Emergency Medicine

## 2019-12-18 ENCOUNTER — Emergency Department: Payer: Medicare Other

## 2019-12-18 ENCOUNTER — Emergency Department
Admission: EM | Admit: 2019-12-18 | Discharge: 2019-12-18 | Disposition: A | Discharge status: Home / Self Care | Payer: Medicare Other | Attending: Emergency Medicine | Admitting: Emergency Medicine

## 2019-12-18 DIAGNOSIS — R0602 Shortness of breath: Secondary | ICD-10-CM | POA: Diagnosis not present

## 2019-12-18 DIAGNOSIS — Z5321 Procedure and treatment not carried out due to patient leaving prior to being seen by health care provider: Secondary | ICD-10-CM | POA: Diagnosis not present

## 2019-12-18 DIAGNOSIS — R2243 Localized swelling, mass and lump, lower limb, bilateral: Secondary | ICD-10-CM | POA: Diagnosis not present

## 2019-12-18 DIAGNOSIS — R609 Edema, unspecified: Secondary | ICD-10-CM | POA: Diagnosis not present

## 2019-12-18 DIAGNOSIS — R079 Chest pain, unspecified: Secondary | ICD-10-CM | POA: Insufficient documentation

## 2019-12-18 LAB — BASIC METABOLIC PANEL
Anion gap: 12 (ref 5–15)
BUN: 14 mg/dL (ref 8–23)
CO2: 29 mmol/L (ref 22–32)
Calcium: 8.6 mg/dL — ABNORMAL LOW (ref 8.9–10.3)
Chloride: 97 mmol/L — ABNORMAL LOW (ref 98–111)
Creatinine, Ser: 0.96 mg/dL (ref 0.61–1.24)
GFR calc Af Amer: >60 mL/min (ref >60)
GFR calc non Af Amer: >60 mL/min (ref >60)
Glucose, Bld: 139 mg/dL — ABNORMAL HIGH (ref 70–99)
Potassium: 3.5 mmol/L (ref 3.5–5.1)
Sodium: 138 mmol/L (ref 135–145)

## 2019-12-18 LAB — CBC
HCT: 42.4 % (ref 39.0–52.0)
Hemoglobin: 13.9 g/dL (ref 13.0–17.0)
MCH: 31.2 pg (ref 26.0–34.0)
MCHC: 32.8 g/dL (ref 30.0–36.0)
MCV: 95.1 fL (ref 80.0–100.0)
Platelets: 204 10*3/uL (ref 150–400)
RBC: 4.46 MIL/uL (ref 4.22–5.81)
RDW: 12.8 % (ref 11.5–15.5)
WBC: 12.8 10*3/uL — ABNORMAL HIGH (ref 4.0–10.5)
nRBC: 0 % (ref 0.0–0.2)

## 2019-12-18 LAB — TROPONIN I (HIGH SENSITIVITY)
Troponin I (High Sensitivity): 8 ng/L (ref <18)
Troponin I (High Sensitivity): 8 ng/L (ref <18)

## 2019-12-18 LAB — BRAIN NATRIURETIC PEPTIDE: B Natriuretic Peptide: 165.1 pg/mL — ABNORMAL HIGH (ref 0.0–100.0)

## 2019-12-18 NOTE — ED Notes (Signed)
This RN walking by patient to take another patient back to a room, pt states that he "shit himself". Pt looking at this RN continues to have a bowel movement while sitting in wheelchair at this time. Pt states "this place ain't fit for dogs". Pt states "I been calling out for y'all to help me". Pt with his pants noted to be lowered at this time while sitting in wheelchair and sitting on blankets.

## 2019-12-18 NOTE — ED Triage Notes (Signed)
Pt to ED via [facility] for swelling in both of his ankles, chest pain, shortness of breath, and pt states that his "guts are handing out of his ass". Pt states that he has not slept in weeks and he is not able to eat. Pt is in NAD.

## 2019-12-18 NOTE — ED Notes (Signed)
This RN to patient in the lobby, pt requesting water to take home meds, this RN spoke with patient regarding not taking his own medications, pt states understanding. Pt requesting to know wait times, this RN explained unable to give wait times.

## 2019-12-23 ENCOUNTER — Inpatient Hospital Stay
Admission: EM | Admit: 2019-12-23 | Discharge: 2019-12-27 | DRG: 981 | Disposition: A | Discharge status: Home / Self Care | Payer: Medicare Other | Attending: Hospitalist | Admitting: Hospitalist

## 2019-12-23 ENCOUNTER — Other Ambulatory Visit: Payer: Self-pay

## 2019-12-23 ENCOUNTER — Emergency Department: Payer: Medicare Other

## 2019-12-23 ENCOUNTER — Inpatient Hospital Stay: Admit: 2019-12-23 | Payer: Medicare Other

## 2019-12-23 ENCOUNTER — Encounter: Payer: Self-pay | Admitting: Emergency Medicine

## 2019-12-23 ENCOUNTER — Other Ambulatory Visit (INDEPENDENT_AMBULATORY_CARE_PROVIDER_SITE_OTHER): Payer: Self-pay | Admitting: Vascular Surgery

## 2019-12-23 ENCOUNTER — Inpatient Hospital Stay: Payer: Medicare Other

## 2019-12-23 DIAGNOSIS — M545 Low back pain, unspecified: Secondary | ICD-10-CM | POA: Diagnosis present

## 2019-12-23 DIAGNOSIS — J9621 Acute and chronic respiratory failure with hypoxia: Secondary | ICD-10-CM | POA: Insufficient documentation

## 2019-12-23 DIAGNOSIS — Z79899 Other long term (current) drug therapy: Secondary | ICD-10-CM | POA: Diagnosis not present

## 2019-12-23 DIAGNOSIS — Z7901 Long term (current) use of anticoagulants: Secondary | ICD-10-CM

## 2019-12-23 DIAGNOSIS — Z9842 Cataract extraction status, left eye: Secondary | ICD-10-CM

## 2019-12-23 DIAGNOSIS — Z825 Family history of asthma and other chronic lower respiratory diseases: Secondary | ICD-10-CM

## 2019-12-23 DIAGNOSIS — G8929 Other chronic pain: Secondary | ICD-10-CM | POA: Diagnosis not present

## 2019-12-23 DIAGNOSIS — F419 Anxiety disorder, unspecified: Secondary | ICD-10-CM | POA: Diagnosis present

## 2019-12-23 DIAGNOSIS — Z20822 Contact with and (suspected) exposure to covid-19: Secondary | ICD-10-CM | POA: Diagnosis present

## 2019-12-23 DIAGNOSIS — I2699 Other pulmonary embolism without acute cor pulmonale: Secondary | ICD-10-CM | POA: Diagnosis not present

## 2019-12-23 DIAGNOSIS — J441 Chronic obstructive pulmonary disease with (acute) exacerbation: Secondary | ICD-10-CM | POA: Diagnosis present

## 2019-12-23 DIAGNOSIS — Z8249 Family history of ischemic heart disease and other diseases of the circulatory system: Secondary | ICD-10-CM | POA: Diagnosis not present

## 2019-12-23 DIAGNOSIS — R0602 Shortness of breath: Secondary | ICD-10-CM | POA: Diagnosis not present

## 2019-12-23 DIAGNOSIS — I824Z1 Acute embolism and thrombosis of unspecified deep veins of right distal lower extremity: Secondary | ICD-10-CM | POA: Diagnosis not present

## 2019-12-23 DIAGNOSIS — J440 Chronic obstructive pulmonary disease with acute lower respiratory infection: Secondary | ICD-10-CM | POA: Diagnosis not present

## 2019-12-23 DIAGNOSIS — F17200 Nicotine dependence, unspecified, uncomplicated: Secondary | ICD-10-CM | POA: Diagnosis present

## 2019-12-23 DIAGNOSIS — F17210 Nicotine dependence, cigarettes, uncomplicated: Secondary | ICD-10-CM | POA: Diagnosis present

## 2019-12-23 DIAGNOSIS — I5031 Acute diastolic (congestive) heart failure: Secondary | ICD-10-CM | POA: Diagnosis not present

## 2019-12-23 DIAGNOSIS — J9811 Atelectasis: Secondary | ICD-10-CM | POA: Diagnosis not present

## 2019-12-23 DIAGNOSIS — Z961 Presence of intraocular lens: Secondary | ICD-10-CM | POA: Diagnosis present

## 2019-12-23 DIAGNOSIS — M79606 Pain in leg, unspecified: Secondary | ICD-10-CM

## 2019-12-23 DIAGNOSIS — Z720 Tobacco use: Secondary | ICD-10-CM | POA: Diagnosis present

## 2019-12-23 DIAGNOSIS — I480 Paroxysmal atrial fibrillation: Secondary | ICD-10-CM | POA: Diagnosis present

## 2019-12-23 DIAGNOSIS — R21 Rash and other nonspecific skin eruption: Secondary | ICD-10-CM | POA: Diagnosis present

## 2019-12-23 DIAGNOSIS — I739 Peripheral vascular disease, unspecified: Secondary | ICD-10-CM | POA: Diagnosis not present

## 2019-12-23 DIAGNOSIS — Z85528 Personal history of other malignant neoplasm of kidney: Secondary | ICD-10-CM

## 2019-12-23 DIAGNOSIS — R0902 Hypoxemia: Secondary | ICD-10-CM

## 2019-12-23 DIAGNOSIS — J189 Pneumonia, unspecified organism: Secondary | ICD-10-CM

## 2019-12-23 DIAGNOSIS — J9601 Acute respiratory failure with hypoxia: Secondary | ICD-10-CM | POA: Diagnosis not present

## 2019-12-23 DIAGNOSIS — Z7982 Long term (current) use of aspirin: Secondary | ICD-10-CM

## 2019-12-23 DIAGNOSIS — I10 Essential (primary) hypertension: Secondary | ICD-10-CM | POA: Diagnosis present

## 2019-12-23 DIAGNOSIS — Z8551 Personal history of malignant neoplasm of bladder: Secondary | ICD-10-CM | POA: Diagnosis not present

## 2019-12-23 DIAGNOSIS — F329 Major depressive disorder, single episode, unspecified: Secondary | ICD-10-CM | POA: Diagnosis present

## 2019-12-23 DIAGNOSIS — K219 Gastro-esophageal reflux disease without esophagitis: Secondary | ICD-10-CM | POA: Diagnosis present

## 2019-12-23 DIAGNOSIS — R069 Unspecified abnormalities of breathing: Secondary | ICD-10-CM | POA: Diagnosis not present

## 2019-12-23 DIAGNOSIS — I82441 Acute embolism and thrombosis of right tibial vein: Secondary | ICD-10-CM | POA: Diagnosis not present

## 2019-12-23 DIAGNOSIS — I82431 Acute embolism and thrombosis of right popliteal vein: Secondary | ICD-10-CM | POA: Diagnosis not present

## 2019-12-23 DIAGNOSIS — E785 Hyperlipidemia, unspecified: Secondary | ICD-10-CM | POA: Diagnosis present

## 2019-12-23 DIAGNOSIS — J90 Pleural effusion, not elsewhere classified: Secondary | ICD-10-CM | POA: Diagnosis not present

## 2019-12-23 DIAGNOSIS — J690 Pneumonitis due to inhalation of food and vomit: Secondary | ICD-10-CM | POA: Diagnosis present

## 2019-12-23 DIAGNOSIS — G5600 Carpal tunnel syndrome, unspecified upper limb: Secondary | ICD-10-CM | POA: Diagnosis present

## 2019-12-23 DIAGNOSIS — Z9841 Cataract extraction status, right eye: Secondary | ICD-10-CM

## 2019-12-23 DIAGNOSIS — Z89421 Acquired absence of other right toe(s): Secondary | ICD-10-CM

## 2019-12-23 HISTORY — DX: Acute respiratory failure with hypoxia: J96.01

## 2019-12-23 HISTORY — DX: Acute and chronic respiratory failure with hypoxia: J96.21

## 2019-12-23 LAB — CBC WITH DIFFERENTIAL/PLATELET
Abs Immature Granulocytes: 0.04 10*3/uL (ref 0.00–0.07)
Basophils Absolute: 0 10*3/uL (ref 0.0–0.1)
Basophils Relative: 0 %
Eosinophils Absolute: 0.1 10*3/uL (ref 0.0–0.5)
Eosinophils Relative: 1 %
HCT: 44.5 % (ref 39.0–52.0)
Hemoglobin: 14.2 g/dL (ref 13.0–17.0)
Immature Granulocytes: 1 %
Lymphocytes Relative: 12 %
Lymphs Abs: 0.9 10*3/uL (ref 0.7–4.0)
MCH: 30.7 pg (ref 26.0–34.0)
MCHC: 31.9 g/dL (ref 30.0–36.0)
MCV: 96.3 fL (ref 80.0–100.0)
Monocytes Absolute: 0.4 10*3/uL (ref 0.1–1.0)
Monocytes Relative: 6 %
Neutro Abs: 5.6 10*3/uL (ref 1.7–7.7)
Neutrophils Relative %: 80 %
Platelets: 194 10*3/uL (ref 150–400)
RBC: 4.62 MIL/uL (ref 4.22–5.81)
RDW: 13.3 % (ref 11.5–15.5)
WBC: 7.1 10*3/uL (ref 4.0–10.5)
nRBC: 0 % (ref 0.0–0.2)

## 2019-12-23 LAB — RESPIRATORY PANEL BY RT PCR (FLU A&B, COVID)
Influenza A by PCR: NEGATIVE
Influenza B by PCR: NEGATIVE
SARS Coronavirus 2 by RT PCR: NEGATIVE

## 2019-12-23 LAB — COMPREHENSIVE METABOLIC PANEL
ALT: 31 U/L (ref 0–44)
AST: 31 U/L (ref 15–41)
Albumin: 3.4 g/dL — ABNORMAL LOW (ref 3.5–5.0)
Alkaline Phosphatase: 73 U/L (ref 38–126)
Anion gap: 14 (ref 5–15)
BUN: 18 mg/dL (ref 8–23)
CO2: 24 mmol/L (ref 22–32)
Calcium: 8.6 mg/dL — ABNORMAL LOW (ref 8.9–10.3)
Chloride: 104 mmol/L (ref 98–111)
Creatinine, Ser: 1.25 mg/dL — ABNORMAL HIGH (ref 0.61–1.24)
GFR calc Af Amer: >60 mL/min (ref >60)
GFR calc non Af Amer: 56 mL/min — ABNORMAL LOW (ref >60)
Glucose, Bld: 118 mg/dL — ABNORMAL HIGH (ref 70–99)
Potassium: 3.5 mmol/L (ref 3.5–5.1)
Sodium: 142 mmol/L (ref 135–145)
Total Bilirubin: 1.1 mg/dL (ref 0.3–1.2)
Total Protein: 7.3 g/dL (ref 6.5–8.1)

## 2019-12-23 LAB — URINALYSIS, COMPLETE (UACMP) WITH MICROSCOPIC
Bacteria, UA: NONE SEEN
Bilirubin Urine: NEGATIVE
Glucose, UA: NEGATIVE mg/dL
Ketones, ur: NEGATIVE mg/dL
Nitrite: NEGATIVE
Protein, ur: NEGATIVE mg/dL
Specific Gravity, Urine: 1.029 (ref 1.005–1.030)
pH: 7 (ref 5.0–8.0)

## 2019-12-23 LAB — PROTIME-INR
INR: 1 (ref 0.8–1.2)
Prothrombin Time: 12.5 seconds (ref 11.4–15.2)

## 2019-12-23 LAB — TROPONIN I (HIGH SENSITIVITY): Troponin I (High Sensitivity): 27 ng/L — ABNORMAL HIGH (ref <18)

## 2019-12-23 LAB — BRAIN NATRIURETIC PEPTIDE: B Natriuretic Peptide: 396.2 pg/mL — ABNORMAL HIGH (ref 0.0–100.0)

## 2019-12-23 LAB — APTT: aPTT: 27 seconds (ref 24–36)

## 2019-12-23 LAB — LACTIC ACID, PLASMA: Lactic Acid, Venous: 2 mmol/L (ref 0.5–1.9)

## 2019-12-23 LAB — PROCALCITONIN: Procalcitonin: <0.1 ng/mL

## 2019-12-23 LAB — HEPARIN LEVEL (UNFRACTIONATED): Heparin Unfractionated: 0.37 [IU]/mL (ref 0.30–0.70)

## 2019-12-23 MED ORDER — NICOTINE 14 MG/24HR TD PT24
14.0000 mg | MEDICATED_PATCH | Freq: Every day | TRANSDERMAL | Status: DC
  Administered 2019-12-24 – 2019-12-27 (×3): 14 mg via TRANSDERMAL
  Filled 2019-12-23 (×4): qty 1

## 2019-12-23 MED ORDER — NICOTINE 21 MG/24HR TD PT24
21.0000 mg | MEDICATED_PATCH | Freq: Once | TRANSDERMAL | Status: AC
  Administered 2019-12-23: 21 mg via TRANSDERMAL
  Filled 2019-12-23: qty 1

## 2019-12-23 MED ORDER — ONDANSETRON HCL 4 MG PO TABS: 4.0000 mg | ORAL_TABLET | Freq: Four times a day (QID) | ORAL | Status: DC | PRN

## 2019-12-23 MED ORDER — IPRATROPIUM-ALBUTEROL 0.5-2.5 (3) MG/3ML IN SOLN
3.0000 mL | Freq: Three times a day (TID) | RESPIRATORY_TRACT | Status: DC
  Administered 2019-12-23 (×3): 3 mL via RESPIRATORY_TRACT
  Filled 2019-12-23 (×4): qty 3

## 2019-12-23 MED ORDER — SUCRALFATE 1 G PO TABS
1.0000 g | ORAL_TABLET | Freq: Three times a day (TID) | ORAL | Status: DC
  Administered 2019-12-23 – 2019-12-27 (×14): 1 g via ORAL
  Filled 2019-12-23 (×15): qty 1

## 2019-12-23 MED ORDER — TRIAMCINOLONE ACETONIDE 0.5 % EX OINT
1.0000 "application " | TOPICAL_OINTMENT | Freq: Two times a day (BID) | CUTANEOUS | Status: DC
  Administered 2019-12-23 – 2019-12-27 (×7): 1 via TOPICAL
  Filled 2019-12-23: qty 15

## 2019-12-23 MED ORDER — MOMETASONE FURO-FORMOTEROL FUM 100-5 MCG/ACT IN AERO
2.0000 | INHALATION_SPRAY | Freq: Two times a day (BID) | RESPIRATORY_TRACT | Status: DC
  Administered 2019-12-23 – 2019-12-27 (×9): 2 via RESPIRATORY_TRACT
  Filled 2019-12-23: qty 8.8

## 2019-12-23 MED ORDER — IPRATROPIUM-ALBUTEROL 0.5-2.5 (3) MG/3ML IN SOLN
3.0000 mL | Freq: Once | RESPIRATORY_TRACT | Status: AC
  Administered 2019-12-23: 3 mL via RESPIRATORY_TRACT

## 2019-12-23 MED ORDER — IPRATROPIUM-ALBUTEROL 0.5-2.5 (3) MG/3ML IN SOLN
3.0000 mL | Freq: Once | RESPIRATORY_TRACT | Status: AC
  Administered 2019-12-23: 3 mL via RESPIRATORY_TRACT
  Filled 2019-12-23: qty 9

## 2019-12-23 MED ORDER — SODIUM CHLORIDE 0.9 % IV SOLN: INTRAVENOUS | Status: DC

## 2019-12-23 MED ORDER — DOCUSATE SODIUM 100 MG PO CAPS
100.0000 mg | ORAL_CAPSULE | Freq: Two times a day (BID) | ORAL | Status: DC
  Administered 2019-12-23 – 2019-12-27 (×9): 100 mg via ORAL
  Filled 2019-12-23 (×9): qty 1

## 2019-12-23 MED ORDER — IOHEXOL 350 MG/ML SOLN
75.0000 mL | Freq: Once | INTRAVENOUS | Status: AC | PRN
  Administered 2019-12-23: 75 mL via INTRAVENOUS

## 2019-12-23 MED ORDER — CLONAZEPAM 0.25 MG PO TBDP
0.2500 mg | ORAL_TABLET | Freq: Three times a day (TID) | ORAL | Status: DC
  Administered 2019-12-23 – 2019-12-27 (×12): 0.25 mg via ORAL
  Filled 2019-12-23 (×13): qty 1

## 2019-12-23 MED ORDER — ADULT MULTIVITAMIN W/MINERALS CH
1.0000 | ORAL_TABLET | Freq: Every day | ORAL | Status: DC
  Administered 2019-12-23 – 2019-12-27 (×5): 1 via ORAL
  Filled 2019-12-23 (×5): qty 1

## 2019-12-23 MED ORDER — METHYLPREDNISOLONE SODIUM SUCC 125 MG IJ SOLR
125.0000 mg | INTRAMUSCULAR | Status: AC
  Administered 2019-12-23: 125 mg via INTRAVENOUS
  Filled 2019-12-23: qty 2

## 2019-12-23 MED ORDER — HEPARIN (PORCINE) 25000 UT/250ML-% IV SOLN
1250.0000 [IU]/h | INTRAVENOUS | Status: DC
  Administered 2019-12-23: 1050 [IU]/h via INTRAVENOUS
  Filled 2019-12-23 (×2): qty 250

## 2019-12-23 MED ORDER — HEPARIN BOLUS VIA INFUSION
3400.0000 [IU] | Freq: Once | INTRAVENOUS | Status: AC
  Administered 2019-12-23: 3400 [IU] via INTRAVENOUS
  Filled 2019-12-23: qty 3400

## 2019-12-23 MED ORDER — OLANZAPINE 2.5 MG PO TABS
1.2500 mg | ORAL_TABLET | Freq: Every day | ORAL | Status: DC
  Administered 2019-12-23 – 2019-12-26 (×4): 1.25 mg via ORAL
  Filled 2019-12-23 (×5): qty 0.5

## 2019-12-23 MED ORDER — ASPIRIN EC 81 MG PO TBEC
81.0000 mg | DELAYED_RELEASE_TABLET | ORAL | Status: DC
  Administered 2019-12-23 – 2019-12-25 (×2): 81 mg via ORAL
  Filled 2019-12-23 (×3): qty 1

## 2019-12-23 MED ORDER — ONDANSETRON HCL 4 MG/2ML IJ SOLN: 4.0000 mg | Freq: Four times a day (QID) | INTRAMUSCULAR | Status: DC | PRN

## 2019-12-23 MED ORDER — GABAPENTIN 100 MG PO CAPS
100.0000 mg | ORAL_CAPSULE | Freq: Every day | ORAL | Status: DC
  Administered 2019-12-23 – 2019-12-26 (×4): 100 mg via ORAL
  Filled 2019-12-23 (×4): qty 1

## 2019-12-23 MED ORDER — SODIUM CHLORIDE 0.9 % IV SOLN
3.0000 g | Freq: Four times a day (QID) | INTRAVENOUS | Status: DC
  Administered 2019-12-23 – 2019-12-25 (×8): 3 g via INTRAVENOUS
  Filled 2019-12-23 (×10): qty 8
  Filled 2019-12-23: qty 3
  Filled 2019-12-23 (×2): qty 8

## 2019-12-23 NOTE — ED Notes (Signed)
This RN to bedside at this time. Pt given warm blanket and non-slip socks per request at this time. Pt repositioned in bed for comfort and prevention of skin breakdown at this time.   Pt stated he wanted to get out of bed. This RN reminded pt that getting up would only worsen his SOB, and advised repositioning using pillows at this time. Pt verbalized understanding and agreed.   Pt denies further needs at this time.

## 2019-12-23 NOTE — Consult Note (Signed)
[place] for Heparin  Indication: pulmonary embolus  Allergies  Allergen Reactions  . Influenza Vaccines     High blood pressure- had to be admitted  . Lipitor [Atorvastatin Calcium]     Muscle pain    Patient Measurements: Height: 5\' 7"  (170.2 cm) Weight: 68 kg (150 lb) IBW/kg (Calculated) : 66.1 Heparin Dosing Weight: 68 kg   Vital Signs: BP: 104/88 (09/30 2100) Pulse Rate: 99 (09/30 2100)  Labs: Recent Labs    12/23/19 0815 12/23/19 2054  HGB 14.2  --   HCT 44.5  --   PLT 194  --   APTT 27  --   LABPROT 12.5  --   INR 1.0  --   HEPARINUNFRC  --  0.37  CREATININE 1.25*  --   TROPONINIHS 27*  --     Estimated Creatinine Clearance: 48.5 mL/min (A) (by C-G formula based on SCr of 1.25 mg/dL (H)).   Medications:  Confirmed w/patient no PTA anticoagulants   Assessment: Pharmacy has been consulted for heparin dosing a patient with a PE. Baseline labs are appropriate to start heparin.   Baseline aPTT 27s, baseline INR 1  Goal of Therapy:  Heparin level 0.3-0.7 units/ml Monitor platelets by anticoagulation protocol: Yes   Plan:  --9/30 at 2054 HL = 0.37, therapeutic. Continue heparin infusion at 1050 units/hr --Re-check confirmatory level in 8 hours --Daily CBC per protocol  Benita Gutter 12/23/2019,9:19 PM
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
## 2019-12-23 NOTE — ED Notes (Signed)
Pt sleeping at this time. Lights dimmed and call bell in reach. NAD at this time.

## 2019-12-23 NOTE — ED Notes (Signed)
Pt removed external urinary cath stating it was not going to fit his "little penis." Pt requesting wash rags to place on him to catch the urine. Pt refusing a depends, condom cath or external cath. Wash cloths provided and phone for pt to call family. Pt talking to family on the phone at this time and denies further needs.

## 2019-12-23 NOTE — ED Notes (Signed)
Pt transported to CT at this time.

## 2019-12-23 NOTE — H&P (Signed)
History and Physical    Brady Smith TDD:[id number] DOB: 03-Jun-1945 DOA: 12/23/2019  PCP: Glean Hess, MD   Patient coming from: Home  I have personally briefly reviewed patient's old medical records in [place]  Chief Complaint: Shortness of breath  HPI: Brady Smith is a 74 y.o. male with medical history significant for nicotine dependence, COPD, depression, GERD, peripheral vascular disease who was brought to the ER by EMS for evaluation of shortness of breath.  Patient states that he has had shortness of breath for couple of weeks initially with exertion but now he is short of breath at rest.  He had gone to the [facility] to eat breakfast when he suddenly had worsening shortness of breath and so EMS was called. Upon EMS arrival he had room air pulse oximetry of 88% but with conversation his pulse oximetry dropped to 78% and he was placed on 15 L of oxygen.  He has been weaned down to 4 L of oxygen via nasal cannula with pulse oximetry greater than 92%. Patient was recently hospitalized for about a week for community-acquired pneumonia from September 11 through the 18th, 2021. He has been to the emergency room once since his discharge for evaluation of shortness of breath and lower extremity swelling but left without being seen. He denies having any fever or chills, he has a cough productive of clear phlegm, denies having any chest pain, no nausea, no vomiting, no diaphoresis or palpitations, no dizziness, no lightheadedness or loss of consciousness. Labs show sodium 142, potassium 3.5, chloride 104, bicarb 24, BUN 18, creatinine 1.25, calcium 8.6, albumin 3.4, AST 31, ALT 31, total protein 7.3, BNP 396, troponin 27, lactic acid 2.0, procalcitonin less than 0.10, white count 7.1, hemoglobin 14.2, hematocrit 44.5, MCV 96.3, RDW 13.3, platelet count 194, Respiratory panel for influenza and coronavirus are negative. Chest x-ray reviewed by me shows airspace opacity most likely  representing pneumonia in the mid and lower lung zones on the right.  Clear left lung CT angiogram of the chest showed pulmonary embolism with a large burden of embolus present in the distal left pulmonary artery and lobar to segmental branches.  There is a smaller burden of lobar to segmental embolus in the right lung.  The RV LV ratio is enlarged, approximately 1.5-1 concerning for right heart strain.  There is extensive heterogeneous airspace opacity predominantly in the dependent right lower lobe with associated atelectasis or consolidation concerning for infection aspiration.  This is in general not distal to occlusive embolus and does not likely reflect pulmonary infarction. Twelve-lead EKG reviewed by me shows atrial fibrillation with minimal diffuse ST depressions and right axis deviation   ED Course: Patient is a 74 year old male with multiple medical problems who presents to the ER via EMS for evaluation of sudden onset worsening shortness of breath from his baseline.  Patient was noted to be hypoxic in the field with room air pulse oximetry of 78% requiring oxygen supplementation.  Chest x-ray was suggestive of possible right lobar pneumonia but patient was recently treated for pneumonia about 2 weeks ago and is currently afebrile, does not have a cough and has a normal white count. CT angiogram shows pulmonary embolism with RV strain.  Patient will be admitted to the hospital for further evaluation.  Review of Systems: As per HPI otherwise 10 point review of systems negative.    Past Medical History:  Diagnosis Date  . Anxiety   . Bladder cancer ([place])   .  COPD (chronic obstructive pulmonary disease) ([place])   . Depression   . GERD (gastroesophageal reflux disease)   . History of kidney cancer   . Peripheral vascular disease ([place])   . Pneumonia     Past Surgical History:  Procedure Laterality Date  . AMPUTATION Right 07/19/2015   Procedure: AMPUTATION DIGIT ( RIGHT FOOT FIFTH TOE );   Surgeon: Algernon Huxley, MD;  Location: [facility];  Service: Vascular;  Laterality: Right;  . BOWEL RESECTION  05/20/2015   Procedure: SMALL BOWEL RESECTION;  Surgeon: Clayburn Pert, MD;  Location: [facility];  Service: General;;  . CATARACT EXTRACTION W/PHACO Left 10/06/2018   Procedure: CATARACT EXTRACTION PHACO AND INTRAOCULAR LENS PLACEMENT ([place])  LEFT;  Surgeon: Birder Robson, MD;  Location: [place];  Service: Ophthalmology;  Laterality: Left;  DAY OF TEST. LIVES IN BOARDING HOUSE  . CATARACT EXTRACTION W/PHACO Right 10/27/2018   Procedure: CATARACT EXTRACTION PHACO AND INTRAOCULAR LENS PLACEMENT ([place])  RIGHT;  Surgeon: Birder Robson, MD;  Location: [place];  Service: Ophthalmology;  Laterality: Right;  PT HAS TO HAVE COVID TEST MORNING OF LEAVE AT LAST PATIENT  . CHOLECYSTECTOMY    . COLONOSCOPY  2000  . CYSTECTOMY W/ CONTINENT DIVERSION  1996  . HERNIA REPAIR    . LAPAROTOMY N/A 05/20/2015   Procedure: EXPLORATORY LAPAROTOMY;  Surgeon: Clayburn Pert, MD;  Location: [facility];  Service: General;  Laterality: N/A;  . PERIPHERAL VASCULAR CATHETERIZATION N/A 07/03/2015   Procedure: Abdominal Aortogram w/Lower Extremity;  Surgeon: Algernon Huxley, MD;  Location: [place] [facility];  Service: Cardiovascular;  Laterality: N/A;  . PERIPHERAL VASCULAR CATHETERIZATION  07/03/2015   Procedure: Lower Extremity Intervention;  Surgeon: Algernon Huxley, MD;  Location: [place] [facility];  Service: Cardiovascular;;  . PROSTATECTOMY  1996  . TONSILLECTOMY       reports that he has been smoking cigarettes. He has a 16.25 pack-year smoking history. He has never used smokeless tobacco. He reports current alcohol use of about 12.0 standard drinks of alcohol per week. He reports that he does not use drugs.  Allergies  Allergen Reactions  . Influenza Vaccines     High blood pressure- had to be admitted  . Lipitor [Atorvastatin Calcium]     Muscle pain    Family History  Problem  Relation Age of Onset  . Heart disease Mother   . Heart disease Father   . COPD Sister      Prior to Admission medications   Medication Sig Start Date End Date Taking? Authorizing Provider  albuterol (VENTOLIN HFA) 108 (90 Base) MCG/ACT inhaler Inhale 2 puffs into the lungs every 6 (six) hours as needed for wheezing or shortness of breath. 12/12/19 01/11/20  Shahmehdi, Valeria Batman, MD  amLODipine (NORVASC) 10 MG tablet Take 1 tablet (10 mg total) by mouth daily. 12/13/19   Shahmehdi, Valeria Batman, MD  aspirin EC 81 MG EC tablet Take 1 tablet (81 mg total) by mouth every other day. Swallow whole. 12/14/19   Shahmehdi, Valeria Batman, MD  clonazePAM (KLONOPIN) 0.25 MG disintegrating tablet Take 1 tablet (0.25 mg total) by mouth 3 (three) times daily. 12/12/19   Shahmehdi, Valeria Batman, MD  Cyanocobalamin (VITAMIN B-12 IJ) Inject 1,000 mcg into the muscle every 30 (thirty) days.     [provider]  docusate sodium (COLACE) 100 MG capsule Take 1 capsule (100 mg total) by mouth 2 (two) times daily. 12/12/19 01/11/20  Deatra James, MD  gabapentin (NEURONTIN)  100 MG capsule Take 1 capsule (100 mg total) by mouth at bedtime. 12/12/19 01/11/20  Shahmehdi, Valeria Batman, MD  ipratropium-albuterol (DUONEB) 0.5-2.5 (3) MG/3ML SOLN Take 3 mLs by nebulization 3 (three) times daily. 11/24/19   Glean Hess, MD  Multiple Vitamin (MULTIVITAMIN WITH MINERALS) TABS tablet Take 1 tablet by mouth daily. 12/13/19 01/12/20  Shahmehdi, Valeria Batman, MD  nicotine (NICODERM CQ - DOSED IN MG/24 HOURS) 14 mg/24hr patch Place 1 patch (14 mg total) onto the skin daily. 12/13/19   Shahmehdi, Valeria Batman, MD  OLANZapine (ZYPREXA) 2.5 MG tablet Take 0.5 tablets (1.25 mg total) by mouth at bedtime. 12/12/19 01/11/20  Shahmehdi, Valeria Batman, MD  sucralfate (CARAFATE) 1 g tablet Take 1 tablet (1 g total) by mouth 4 (four) times daily -  with meals and at bedtime. 12/12/19   Shahmehdi, Valeria Batman, MD  triamcinolone ointment (KENALOG) 0.5 % Apply 1 application  topically 2 (two) times daily. To rash on leg 11/24/19   Glean Hess, MD    Physical Exam: Vitals:   12/23/19 0810 12/23/19 0812 12/23/19 0830 12/23/19 1001  BP:   (!) 142/87 126/88  Pulse:   95 95  Resp:   (!) 24 (!) 26  Temp:      TempSrc:      SpO2:  91% 95% 94%  Weight: 68 kg     Height: 5\' 7"  (1.702 m)        Vitals:   12/23/19 0810 12/23/19 0812 12/23/19 0830 12/23/19 1001  BP:   (!) 142/87 126/88  Pulse:   95 95  Resp:   (!) 24 (!) 26  Temp:      TempSrc:      SpO2:  91% 95% 94%  Weight: 68 kg     Height: 5\' 7"  (1.702 m)       Constitutional: NAD, alert and oriented x 3.  Chronically ill-appearing Eyes: PERRL, lids and conjunctivae pallor ENMT: Mucous membranes are moist.  Neck: normal, supple, no masses, no thyromegaly Respiratory: Scattered rhonchi of the right mid to lower lung zones , faint wheezing. Normal respiratory effort. No accessory muscle use.  Cardiovascular: Irregularly irregular, no murmurs / rubs / gallops. No extremity edema. 2+ pedal pulses. No carotid bruits.  Abdomen: no tenderness, no masses palpated. No hepatosplenomegaly. Bowel sounds positive.  Multiple ventral hernias Musculoskeletal: no clubbing / cyanosis. No joint deformity upper and lower extremities.  Skin: no rashes, lesions, ulcers.  Neurologic: No gross focal neurologic deficit. Psychiatric: Normal mood and affect.   Labs on Admission: I have personally reviewed following labs and imaging studies  CBC: Recent Labs  Lab 12/18/19 1014 12/23/19 0815  WBC 12.8* 7.1  NEUTROABS  --  5.6  HGB 13.9 14.2  HCT 42.4 44.5  MCV 95.1 96.3  PLT 204 938   Basic Metabolic Panel: Recent Labs  Lab 12/18/19 1014 12/23/19 0815  NA 138 142  K 3.5 3.5  CL 97* 104  CO2 29 24  GLUCOSE 139* 118*  BUN 14 18  CREATININE 0.96 1.25*  CALCIUM 8.6* 8.6*   GFR: Estimated Creatinine Clearance: 48.5 mL/min (A) (by C-G formula based on SCr of 1.25 mg/dL (H)). Liver Function  Tests: Recent Labs  Lab 12/23/19 0815  AST 31  ALT 31  ALKPHOS 73  BILITOT 1.1  PROT 7.3  ALBUMIN 3.4*   No results for input(s): LIPASE, AMYLASE in the last 168 hours. No results for input(s): AMMONIA in the last 168 hours. Coagulation Profile:  Recent Labs  Lab 12/23/19 0815  INR 1.0   Cardiac Enzymes: No results for input(s): CKTOTAL, CKMB, CKMBINDEX, TROPONINI in the last 168 hours. BNP (last 3 results) No results for input(s): PROBNP in the last 8760 hours. HbA1C: No results for input(s): HGBA1C in the last 72 hours. CBG: No results for input(s): GLUCAP in the last 168 hours. Lipid Profile: No results for input(s): CHOL, HDL, LDLCALC, TRIG, CHOLHDL, LDLDIRECT in the last 72 hours. Thyroid Function Tests: No results for input(s): TSH, T4TOTAL, FREET4, T3FREE, THYROIDAB in the last 72 hours. Anemia Panel: No results for input(s): VITAMINB12, FOLATE, FERRITIN, TIBC, IRON, RETICCTPCT in the last 72 hours. Urine analysis:    Component Value Date/Time   COLORURINE YELLOW (A) 05/22/2015 0925   APPEARANCEUR CLEAR (A) 05/22/2015 0925   APPEARANCEUR Hazy 07/27/2013 1041   LABSPEC 1.015 05/22/2015 0925   LABSPEC 1.010 07/27/2013 1041   PHURINE 6.0 05/22/2015 0925   GLUCOSEU NEGATIVE 05/22/2015 0925   GLUCOSEU Negative 07/27/2013 1041   HGBUR 1+ (A) 05/22/2015 0925   BILIRUBINUR NEGATIVE 05/22/2015 0925   BILIRUBINUR Negative 07/27/2013 1041   KETONESUR TRACE (A) 05/22/2015 0925   PROTEINUR NEGATIVE 05/22/2015 0925   NITRITE NEGATIVE 05/22/2015 0925   LEUKOCYTESUR NEGATIVE 05/22/2015 0925   LEUKOCYTESUR Negative 07/27/2013 1041    Radiological Exams on Admission: CT Angio Chest PE W and/or Wo Contrast  Result Date: 12/23/2019 CLINICAL DATA:  Chest pain, shortness of breath EXAM: CT ANGIOGRAPHY CHEST WITH CONTRAST TECHNIQUE: Multidetector CT imaging of the chest was performed using the standard protocol during bolus administration of intravenous contrast. Multiplanar  CT image reconstructions and MIPs were obtained to evaluate the vascular anatomy. CONTRAST:  52mL OMNIPAQUE IOHEXOL 350 MG/ML SOLN COMPARISON:  None. FINDINGS: Cardiovascular: Satisfactory opacification of the pulmonary arteries to the segmental level. Positive examination for pulmonary embolism with a large burden of embolus present in the distal left pulmonary artery and lobar through segmental branches. There is a smaller burden of lobar to segmental embolus in the right lung. Normal heart size. The RV LV ratio is enlarged, approximately 1.5-1. No pericardial effusion. Aortic atherosclerosis. Three-vessel coronary artery calcifications. Mediastinum/Nodes: No enlarged mediastinal, hilar, or axillary lymph nodes. Frothy debris in the lower trachea. Thyroid gland and esophagus demonstrate no significant findings. Lungs/Pleura: Severe centrilobular emphysema. Diffuse bilateral bronchial wall thickening. There is extensive heterogeneous airspace opacity predominantly in the dependent right lower lobe with associated atelectasis or consolidation and a small right pleural effusion. There is calcification of the visceral pleura of the right lower lobe. Upper Abdomen: No acute abnormality. Musculoskeletal: No chest wall abnormality. No acute or significant osseous findings. Review of the MIP images confirms the above findings. IMPRESSION: 1. Positive examination for pulmonary embolism with a large burden of embolus present in the distal left pulmonary artery and lobar through segmental branches. There is a smaller burden of lobar to segmental embolus in the right lung. 2. The RV LV ratio is enlarged, approximately 1.5-1, concerning for right heart strain. 3. There is extensive heterogeneous airspace opacity predominantly in the dependent right lower lobe with associated atelectasis or consolidation, concerning for infection or aspiration. This is in general not distal to occlusive embolus and does not likely reflect  pulmonary infarction. 4. Small right pleural effusion with calcification of the visceral pleura of the right lower lobe, likely chronic and loculated. 5. Severe centrilobular emphysema. Emphysema (ICD10-J43.9). 6. Coronary artery disease.  Aortic Atherosclerosis (ICD10-I70.0). These results were called by telephone at the time of interpretation on  12/23/2019 at 10:31 am to Dr. Delman Kitten , who verbally acknowledged these results. Electronically Signed   By: Eddie Candle M.D.   On: 12/23/2019 10:36   DG Chest Port 1 View  Result Date: 12/23/2019 CLINICAL DATA:  Decreased oxygen saturation EXAM: PORTABLE CHEST 1 VIEW COMPARISON:  December 18, 2019 FINDINGS: There is persistent airspace opacity in the right mid and lower lung regions. There is a minimal right pleural effusion. The left lung is clear. Heart size and pulmonary vascularity are normal. No adenopathy. No bone lesions. IMPRESSION: Airspace opacity, most likely representing pneumonia in the mid and lower lung regions on the right with slightly more consolidation compared to most recent study. Rather minimal pleural effusion on the right noted. Left lung clear. Cardiac silhouette normal. Electronically Signed   By: Lowella Grip III M.D.   On: 12/23/2019 08:48    EKG: Independently reviewed.  Atrial fibrillation with minimal diffuse ST depressions and right axis deviation  Assessment/Plan Principal Problem:   Pulmonary embolism (HCC) Active Problems:   Essential hypertension   Nicotine addiction   Paroxysmal atrial fibrillation (HCC)   Tobacco abuse   COPD with acute exacerbation (HCC)   Acute respiratory failure with hypoxia (HCC)    Acute pulmonary embolism Patient presented for evaluation of worsening shortness of breath from his baseline and was hypoxic in the field CT angiogram is positive for pulmonary embolism with a large burden of embolus present in the distal left pulmonary artery and lobar through segmental branches.  There is a smaller burden of lobar to segmental embolus in the right lung. The RV LV ratio is enlarged, approximately 1.5-1, concerning for right heart strain. We will start patient on heparin drip per protocol Obtain 2D echocardiogram We will request vascular surgery consult   Acute respiratory failure with hypoxia Secondary to acute PE Patient was hypoxic in the field with room air pulse oximetry of 78% and initially required 15 L nonrebreather mask to improve pulse oximetry to the 90s He has been weaned down to 4 L of oxygen via nasal cannula Patient will need assessment for home oxygen need prior to discharge    Right lobar pneumonia ??  Aspiration pneumonia CT angiogram of the chest shows extensive heterogeneous airspace opacity predominantly in the dependent right lower lobe with associated atelectasis or consolidation, concerning for infection or aspiration. This is in general not distal to occlusive embolus and does not likely reflect pulmonary infarction. We will keep patient n.p.o. for now Speech therapy consult for swallow function evaluation Start patient empirically on Unasyn   Nicotine dependence Smoking cessation has been discussed with patient in detail Place patient on nicotine transdermal patch 14 mg daily   COPD with acute exacerbation Place patient on scheduled and as needed bronchodilator therapy Continue inhaled steroids   Anxiety and depression Continue Zyprexa and clonazepam    Paroxysmal atrial fibrillation Patient has a CHADS2VASC  score of 5 and ideally requires long-term anticoagulation as primary prophylaxis for an acute stroke Continue heparin drip    DVT prophylaxis: Heparin Code Status: Full code Family Communication: Greater than 50% of time was spent discussing plan of care with patient at the bedside.  He verbalizes understanding and agrees with the plan. Disposition Plan: Back to previous home environment Consults called: Vascular  surgery    Saagar Tortorella MD [facility]     12/23/2019, 11:19 AM

## 2019-12-23 NOTE — Evaluation (Addendum)
Clinical/Bedside Swallow Evaluation Patient Details  Name: Brady Smith MRN: [id number] Date of Birth: [birth year]  Today's Date: 12/23/2019 Time: SLP Start Time (ACUTE ONLY): 1300 SLP Stop Time (ACUTE ONLY): 1345 SLP Time Calculation (min) (ACUTE ONLY): 45 min  Past Medical History:  Past Medical History:  Diagnosis Date  . Anxiety   . Bladder cancer ([place])   . COPD (chronic obstructive pulmonary disease) ([place])   . Depression   . GERD (gastroesophageal reflux disease)   . History of kidney cancer   . Peripheral vascular disease ([place])   . Pneumonia    Past Surgical History:  Past Surgical History:  Procedure Laterality Date  . AMPUTATION Right 07/19/2015   Procedure: AMPUTATION DIGIT ( RIGHT FOOT FIFTH TOE );  Surgeon: Algernon Huxley, MD;  Location: [facility];  Service: Vascular;  Laterality: Right;  . BOWEL RESECTION  05/20/2015   Procedure: SMALL BOWEL RESECTION;  Surgeon: Clayburn Pert, MD;  Location: [facility];  Service: General;;  . CATARACT EXTRACTION W/PHACO Left 10/06/2018   Procedure: CATARACT EXTRACTION PHACO AND INTRAOCULAR LENS PLACEMENT ([place])  LEFT;  Surgeon: Birder Robson, MD;  Location: [place];  Service: Ophthalmology;  Laterality: Left;  DAY OF TEST. LIVES IN BOARDING HOUSE  . CATARACT EXTRACTION W/PHACO Right 10/27/2018   Procedure: CATARACT EXTRACTION PHACO AND INTRAOCULAR LENS PLACEMENT ([place])  RIGHT;  Surgeon: Birder Robson, MD;  Location: [place];  Service: Ophthalmology;  Laterality: Right;  PT HAS TO HAVE COVID TEST MORNING OF LEAVE AT LAST PATIENT  . CHOLECYSTECTOMY    . COLONOSCOPY  2000  . CYSTECTOMY W/ CONTINENT DIVERSION  1996  . HERNIA REPAIR    . LAPAROTOMY N/A 05/20/2015   Procedure: EXPLORATORY LAPAROTOMY;  Surgeon: Clayburn Pert, MD;  Location: [facility];  Service: General;  Laterality: N/A;  . PERIPHERAL VASCULAR CATHETERIZATION N/A 07/03/2015   Procedure: Abdominal Aortogram w/Lower Extremity;  Surgeon: Algernon Huxley, MD;   Location: [place] [facility];  Service: Cardiovascular;  Laterality: N/A;  . PERIPHERAL VASCULAR CATHETERIZATION  07/03/2015   Procedure: Lower Extremity Intervention;  Surgeon: Algernon Huxley, MD;  Location: [place] [facility];  Service: Cardiovascular;;  . PROSTATECTOMY  1996  . TONSILLECTOMY     HPI:  is a 74 y.o. male with medical history significant for nicotine dependence, COPD, depression, GERD, peripheral vascular disease who was brought to the ER by EMS for evaluation of shortness of breath.  Patient states that he has had shortness of breath for couple of weeks initially with exertion but now he is short of breath at rest. Chest x-ray was suggestive of possible right lobar pneumonia but patient was recently treated for pneumonia about 2 weeks ago and is currently afebrile, does not have a cough and has a normal white count. Pt reported significant issues w/ "acid reflux" at home; hernia repair(?). Pt is Talkative; easy to be distracted. Unsure of his full Insight into his deficits.   Assessment / Plan / Recommendation Clinical Impression  Pt was seen for bedside swallow evaluation.  Pt was awake, alert & cooperative throughout session, although notably distractible & talkative. Unsure of pt Cognitive baseline in light of ETOH use. Pt appears easily SOB w/ any exertion including moving in bed and/or talking -- suspect impact from Baseline Pulmonary dxs. Pt endorsed SOB at home. Per pt interview, pt experiences "choking episodes" at home intermittently especially when eating dry foods; occasional globus sensation in throat after eating/drinking liquids, which pt attributes to "acid reflux" (  consistent w/ dx of GERD in pt chart). He reports he has a preference for soft foods at home d/t missing dentition.  Oral mech exam revealed missing/poor dentition. Lingual rom, lingual strength & lingual anterior/posterior protrusion WFL. Min increased cueing was required for lingual ROM tasks during oral  mech exam possibly d/t pt's cognitive status/inattention. Pt demonstrated a strong volitional cough.  Pt was assessed w/ PO trials of thin liquids via cup & straw, puree via spoon, and soft solids via spoon. Pt required assist to sit upright in bed; able to self feed all trial consistencies w/ min assist. No overt clinical s/s of aspiration were observed for PO trials of all consistencies. O2 sats remained 92-95% (his baseline prior). Voice clear between all PO trials. During all PO tasks pt demonstrated [facility] labial seal & timely A-P transfer of bolus. Oral phase min delayed d/t pt's overall distractibility & talkative nature. At times it was observed that clearing of bolus was incomplete, also possibly d/t distractibility & talkativeness.  Pt required min-mod verbal cues to reorient attention to tasks, and cues for use of compensatory strategies to increase conservation of energy. Pt was instructed on compensatory strategies including small bites/sips, slow rate, upright positioning, and alternating solids & liquids for safer swallowing as well as take Rest Breaks/Time b/t trials to lessen any increased WOB. Of note, pt's respiratory effort appeared increased at times during PO trials-- however, it is unclear whether this is d/t physical movement of pt, increased verbal output, or exertion of PO intake. Assistance w/ positioning client may help reduce likelihood of increased respiratory effort at mealtime, as well as cueing for rest breaks between boluses & reduced verbal output.   Recommend a Mech Soft diet w/ thin liquids; intermittent supervision at mealtime to cue for compensatory strategies for safer swallowing & reduction of aspiration risk. Follow general aspiration precautions & reflux precautions. Reduce Distractions; conserve energy; less Talking during meals. Consider discussion w/ PCP for management of "acid reflux". ANY Esophageal Regurgitation/Retrograde flow of Reflux material could lead to  aspiration of REflux thus impact pulmonary status. NSG/MD updated. NSG to reconsult if any new needs arise during admit.      SLP Visit Diagnosis: Dysphagia, unspecified (R13.10)    Aspiration Risk  Mild aspiration risk but reduced following aspiration and Reflux precautions   Diet Recommendation   Mech Soft diet (for ease of mastication w/ missing Dentition) w/ thin liquids w/ general aspiration precautions including less Talking during meals, Rest Breaks to conserve energy; GERD, Reflux precautions w/ meals.  Medication Administration: Whole meds with puree if unable to tolerate 1 at a time w/ water   Other  Recommendations Oral Care Recommendations: Oral care BID w/ support for pt as needed  Follow up Recommendations None at this time indicated      Frequency and Duration n/a       Prognosis Prognosis for Safe Diet Advancement: Good Barriers to Reach Goals: Cognitive deficits;Behavior; GERD baseline     Swallow Study   General Date of Onset: 12/23/19 HPI: is a 74 y.o. male with medical history significant for nicotine dependence, COPD, depression, GERD, peripheral vascular disease who was brought to the ER by EMS for evaluation of shortness of breath.  Patient states that he has had shortness of breath for couple of weeks initially with exertion but now he is short of breath at rest. Chest x-ray was suggestive of possible right lobar pneumonia but patient was recently treated for pneumonia about 2 weeks ago  and is currently afebrile, does not have a cough and has a normal white count. Type of Study: Bedside Swallow Evaluation Previous Swallow Assessment: 05/29/2015 Diet Prior to this Study: Regular Temperature Spikes Noted: No Respiratory Status: Nasal cannula (4L) History of Recent Intubation: No Behavior/Cognition: Alert;Cooperative;Pleasant mood;Distractible;Requires cueing Oral Cavity Assessment: Within Functional Limits Oral Care Completed by SLP: No Oral Cavity -  Dentition: Poor condition;Missing dentition (missing anterior) Vision: Functional for self-feeding Self-Feeding Abilities: Able to feed self;Needs set up Patient Positioning: Upright in bed Baseline Vocal Quality: Normal Volitional Cough: Strong    Oral/Motor/Sensory Function Overall Oral Motor/Sensory Function: Within functional limits   Ice Chips Ice chips: Within functional limits Presentation: Self Fed;Spoon Other Comments: 2x   Thin Liquid Thin Liquid: Within functional limits Presentation: Cup;Self Fed;Straw Other Comments: 10x    Nectar Thick Nectar Thick Liquid: Not tested   Honey Thick Honey Thick Liquid: Not tested   Puree Puree: Within functional limits Presentation: Self Fed;Spoon Other Comments:  (8x)   Solid     Solid: Within functional limits Presentation: Self Fed;Spoon Other Comments: 3x      Quintella Baton  Graduate Clinician 12/23/2019,2:45 PM   The information in this patient note, response to treatment, and overall treatment plan developed has been reviewed and agreed upon by this clinician.   [name], MS, [facility] [phone]

## 2019-12-23 NOTE — Consult Note (Signed)
Pharmacy Antibiotic Note  Brady Smith is a 74 y.o. male admitted on 12/23/2019 with aspiration pneumonia.  Pharmacy has been consulted for Unasyn dosing.  Temp 97.8 F WBC 12.8>> 7.1 PCT <0.10  Plan: Will order Unasyn 3g Q6H  Height: 5\' 7"  (170.2 cm) Weight: 68 kg (150 lb) IBW/kg (Calculated) : 66.1  Temp (24hrs), Avg:97.8 F (36.6 C), Min:97.8 F (36.6 C), Max:97.8 F (36.6 C)  Recent Labs  Lab 12/18/19 1014 12/23/19 0815 12/23/19 0834  WBC 12.8* 7.1  --   CREATININE 0.96 1.25*  --   LATICACIDVEN  --   --  2.0*    Estimated Creatinine Clearance: 48.5 mL/min (A) (by C-G formula based on SCr of 1.25 mg/dL (H)).    Allergies  Allergen Reactions  . Influenza Vaccines     High blood pressure- had to be admitted  . Lipitor [Atorvastatin Calcium]     Muscle pain    Antimicrobials this admission: 9/30 Unasyn >>  Dose adjustments this admission:   Microbiology results: 9/30 BCx: pending  UCx:  Sputum:   MRSA PCR:   Thank you for allowing pharmacy to be a part of this patient's care.  Rowland Lathe 12/23/2019 11:12 AM

## 2019-12-23 NOTE — ED Notes (Signed)
Medications administered per order, pt repositioned in bed to eat dinner. Pt repositioned in bed to eat meal tray at this time.

## 2019-12-23 NOTE — H&P (View-Only) (Signed)
[facility] Vascular Consult Note  MRN : [id number]  Brady Smith is a 74 y.o. (1946-02-08) male who presents with chief complaint of  Chief Complaint  Patient presents with   Shortness of Breath   History of Present Illness:  Brady Smith is a 74 y.o. male with medical history significant for nicotine dependence, COPD, depression, GERD, peripheral vascular disease who was brought to the ER by EMS for evaluation of shortness of breath.  Patient states that he has had shortness of breath for couple of weeks initially with exertion but now he is short of breath at rest.  He had gone to the [facility] to eat breakfast when he suddenly had worsening shortness of breath and so EMS was called.  Upon EMS arrival he had room air pulse oximetry of 88% but with conversation his pulse oximetry dropped to 78% and he was placed on 15 L of oxygen.  He has been weaned down to 4 L of oxygen via nasal cannula with pulse oximetry greater than 92%. Patient was recently hospitalized for about a week for community-acquired pneumonia from September 11 through the 18th, 2021.  He has been to the emergency room once since his discharge for evaluation of shortness of breath and lower extremity swelling but left without being seen.  He denies having any fever or chills, he has a cough productive of clear phlegm, denies having any chest pain, no nausea, no vomiting, no diaphoresis or palpitations, no dizziness, no lightheadedness or loss of consciousness. He denies any past medical hx of DVT, recent surgery / trauma, prolonged immobility.   12/23/19: 1. Positive examination for pulmonary embolism with a large burden of embolus present in the distal left pulmonary artery and lobar through segmental branches. There is a smaller burden of lobar to segmental embolus in the right lung. 2. The RV LV ratio is enlarged, approximately 1.5-1, concerning for right heart strain. 3. There is  extensive heterogeneous airspace opacity predominantly in the dependent right lower lobe with associated atelectasis or consolidation, concerning for infection or aspiration. This is in general not distal to occlusive embolus and does not likely reflect pulmonary infarction. 4. Small right pleural effusion with calcification of the visceral pleura of the right lower lobe, likely chronic and loculated. 5. Severe centrilobular emphysema. Emphysema (ICD10-J43.9). 6. Coronary artery disease.  Aortic Atherosclerosis (ICD10-I70.0).  Vascular Surgery was consulted by Dr. Francine Graven for possible Thrombectomy / Thrombolysis.  Current Facility-Administered Medications  Medication Dose Route Frequency Provider Last Rate Last Admin   0.9 %  sodium chloride infusion   Intravenous Continuous Agbata, Tochukwu, MD       Ampicillin-Sulbactam (UNASYN) 3 g in sodium chloride 0.9 % 100 mL IVPB  3 g Intravenous Q6H Duncan, Asajah R, RPH       aspirin EC tablet 81 mg  81 mg Oral QODAY Agbata, Tochukwu, MD       clonazePAM (KLONOPIN) disintegrating tablet 0.25 mg  0.25 mg Oral TID Agbata, Tochukwu, MD       docusate sodium (COLACE) capsule 100 mg  100 mg Oral BID Agbata, Tochukwu, MD       gabapentin (NEURONTIN) capsule 100 mg  100 mg Oral QHS Agbata, Tochukwu, MD       heparin ADULT infusion 100 units/mL (25000 units/27mL sodium chloride 0.45%)  1,050 Units/hr Intravenous Continuous Kristeen Miss R, RPH 10.5 mL/hr at 12/23/19 1135 1,050 Units/hr at 12/23/19 1135   ipratropium-albuterol (DUONEB) 0.5-2.5 (3) MG/3ML nebulizer solution 3  mL  3 mL Nebulization TID Agbata, Tochukwu, MD       mometasone-formoterol (DULERA) 100-5 MCG/ACT inhaler 2 puff  2 puff Inhalation BID Agbata, Tochukwu, MD       multivitamin with minerals tablet 1 tablet  1 tablet Oral Daily Agbata, Tochukwu, MD       [START ON 12/24/2019] nicotine (NICODERM CQ - dosed in mg/24 hours) patch 14 mg  14 mg Transdermal Daily Agbata, Tochukwu, MD        nicotine (NICODERM CQ - dosed in mg/24 hours) patch 21 mg  21 mg Transdermal Once Delman Kitten, MD   21 mg at 12/23/19 0849   OLANZapine (ZYPREXA) tablet 1.25 mg  1.25 mg Oral QHS Agbata, Tochukwu, MD       ondansetron (ZOFRAN) tablet 4 mg  4 mg Oral Q6H PRN Agbata, Tochukwu, MD       Or   ondansetron (ZOFRAN) injection 4 mg  4 mg Intravenous Q6H PRN Agbata, Tochukwu, MD       sucralfate (CARAFATE) tablet 1 g  1 g Oral TID WC & HS Agbata, Tochukwu, MD       triamcinolone ointment (KENALOG) 0.5 % 1 application  1 application Topical BID Agbata, Tochukwu, MD       Current Outpatient Medications  Medication Sig Dispense Refill   albuterol (VENTOLIN HFA) 108 (90 Base) MCG/ACT inhaler Inhale 2 puffs into the lungs every 6 (six) hours as needed for wheezing or shortness of breath. 8.5 g 5   amLODipine (NORVASC) 10 MG tablet Take 1 tablet (10 mg total) by mouth daily. 30 tablet 2   aspirin EC 81 MG EC tablet Take 1 tablet (81 mg total) by mouth every other day. Swallow whole. 30 tablet 11   clonazePAM (KLONOPIN) 0.25 MG disintegrating tablet Take 1 tablet (0.25 mg total) by mouth 3 (three) times daily. 60 tablet 2   Cyanocobalamin (VITAMIN B-12 IJ) Inject 1,000 mcg into the muscle every 30 (thirty) days.      docusate sodium (COLACE) 100 MG capsule Take 1 capsule (100 mg total) by mouth 2 (two) times daily. 60 capsule 2   gabapentin (NEURONTIN) 100 MG capsule Take 1 capsule (100 mg total) by mouth at bedtime. 30 capsule 2   ipratropium-albuterol (DUONEB) 0.5-2.5 (3) MG/3ML SOLN Take 3 mLs by nebulization 3 (three) times daily. 360 mL 3   Multiple Vitamin (MULTIVITAMIN WITH MINERALS) TABS tablet Take 1 tablet by mouth daily. 30 tablet 3   nicotine (NICODERM CQ - DOSED IN MG/24 HOURS) 14 mg/24hr patch Place 1 patch (14 mg total) onto the skin daily. 28 patch 0   OLANZapine (ZYPREXA) 2.5 MG tablet Take 0.5 tablets (1.25 mg total) by mouth at bedtime. 30 tablet 2   sucralfate  (CARAFATE) 1 g tablet Take 1 tablet (1 g total) by mouth 4 (four) times daily -  with meals and at bedtime. 42 tablet 3   triamcinolone ointment (KENALOG) 0.5 % Apply 1 application topically 2 (two) times daily. To rash on leg 30 g 2   Past Medical History:  Diagnosis Date   Anxiety    Bladder cancer ([place])    COPD (chronic obstructive pulmonary disease) (HCC)    Depression    GERD (gastroesophageal reflux disease)    History of kidney cancer    Peripheral vascular disease ([place])    Pneumonia    Past Surgical History:  Procedure Laterality Date   AMPUTATION Right 07/19/2015   Procedure: AMPUTATION DIGIT ( RIGHT FOOT FIFTH  TOE );  Surgeon: Algernon Huxley, MD;  Location: [facility];  Service: Vascular;  Laterality: Right;   BOWEL RESECTION  05/20/2015   Procedure: SMALL BOWEL RESECTION;  Surgeon: Clayburn Pert, MD;  Location: [facility];  Service: General;;   CATARACT EXTRACTION W/PHACO Left 10/06/2018   Procedure: CATARACT EXTRACTION PHACO AND INTRAOCULAR LENS PLACEMENT ([place])  LEFT;  Surgeon: Birder Robson, MD;  Location: [place];  Service: Ophthalmology;  Laterality: Left;  DAY OF TEST. LIVES IN BOARDING HOUSE   CATARACT EXTRACTION W/PHACO Right 10/27/2018   Procedure: CATARACT EXTRACTION PHACO AND INTRAOCULAR LENS PLACEMENT ([place])  RIGHT;  Surgeon: Birder Robson, MD;  Location: [place];  Service: Ophthalmology;  Laterality: Right;  PT HAS TO HAVE COVID TEST MORNING OF LEAVE AT LAST PATIENT   CHOLECYSTECTOMY     COLONOSCOPY  2000   CYSTECTOMY W/ CONTINENT DIVERSION  1996   HERNIA REPAIR     LAPAROTOMY N/A 05/20/2015   Procedure: EXPLORATORY LAPAROTOMY;  Surgeon: Clayburn Pert, MD;  Location: [facility];  Service: General;  Laterality: N/A;   PERIPHERAL VASCULAR CATHETERIZATION N/A 07/03/2015   Procedure: Abdominal Aortogram w/Lower Extremity;  Surgeon: Algernon Huxley, MD;  Location: [place] [facility];  Service: Cardiovascular;  Laterality: N/A;    PERIPHERAL VASCULAR CATHETERIZATION  07/03/2015   Procedure: Lower Extremity Intervention;  Surgeon: Algernon Huxley, MD;  Location: [place] [facility];  Service: Cardiovascular;;   PROSTATECTOMY  1996   TONSILLECTOMY     Social History Social History   Tobacco Use   Smoking status: Current Every Day Smoker    Packs/day: 0.25    Years: 65.00    Pack years: 16.25    Types: Cigarettes   Smokeless tobacco: Never Used   Tobacco comment: reduced # of packs smoked to 5-6 ciggs daily from 3 pks daily   Vaping Use   Vaping Use: Never used  Substance Use Topics   Alcohol use: Yes    Alcohol/week: 12.0 standard drinks    Types: 12 Cans of beer per week   Drug use: No   Family History Family History  Problem Relation Age of Onset   Heart disease Mother    Heart disease Father    COPD Sister     Allergies  Allergen Reactions   Influenza Vaccines     High blood pressure- had to be admitted   Lipitor [Atorvastatin Calcium]     Muscle pain  Denies family history of peripheral artery disease, venous disease or renal disease.  REVIEW OF SYSTEMS (Negative unless checked)  Constitutional: [] Weight loss  [] Fever  [] Chills Cardiac: [] Chest pain   [] Chest pressure   [] Palpitations   [x] Shortness of breath when laying flat   [x] Shortness of breath at rest   [x] Shortness of breath with exertion. Vascular:  [] Pain in legs with walking   [] Pain in legs at rest   [] Pain in legs when laying flat   [] Claudication   [] Pain in feet when walking  [] Pain in feet at rest  [] Pain in feet when laying flat   [] History of DVT   [] Phlebitis   [] Swelling in legs   [] Varicose veins   [] Non-healing ulcers Pulmonary:   [] Uses home oxygen   [] Productive cough   [] Hemoptysis   [] Wheeze  [] COPD   [] Asthma Neurologic:  [] Dizziness  [] Blackouts   [] Seizures   [] History of stroke   [] History of TIA  [] Aphasia   [] Temporary blindness   [] Dysphagia   [] Weakness or numbness in  arms   [] Weakness or numbness in  legs Musculoskeletal:  [] Arthritis   [] Joint swelling   [] Joint pain   [] Low back pain Hematologic:  [] Easy bruising  [] Easy bleeding   [] Hypercoagulable state   [] Anemic  [] Hepatitis Gastrointestinal:  [] Blood in stool   [] Vomiting blood  [] Gastroesophageal reflux/heartburn   [] Difficulty swallowing. Genitourinary:  [] Chronic kidney disease   [] Difficult urination  [] Frequent urination  [] Burning with urination   [] Blood in urine Skin:  [] Rashes   [] Ulcers   [] Wounds Psychological:  [] History of anxiety   []  History of major depression.  Physical Examination  Vitals:   12/23/19 1001 12/23/19 1030 12/23/19 1100 12/23/19 1130  BP: 126/88 139/81 127/76 111/72  Pulse: 95 (!) 104 96 90  Resp: (!) 26 20 (!) 26 (!) 23  Temp:      TempSrc:      SpO2: 94% 96% 95% 94%  Weight:      Height:       Body mass index is 23.49 kg/m. Gen:  WD/WN, NAD Head: [place]/AT, No temporalis wasting. Prominent temp pulse not noted. Ear/Nose/Throat: Hearing grossly intact, nares w/o erythema or drainage, oropharynx w/o Erythema/Exudate Eyes: Sclera non-icteric, conjunctiva clear Neck: Trachea midline.  No JVD.  Pulmonary:  Good air movement, respirations not labored @ rest, equal bilaterally.  Cardiac: RRR, normal S1, S2. Vascular:  Vessel Right Left  Radial Palpable Palpable  Ulnar Palpable Palpable  Brachial Palpable Palpable  Carotid Palpable, without bruit Palpable, without bruit  Aorta Not palpable N/A  Femoral Palpable Palpable  Popliteal Palpable Palpable  PT Palpable Palpable  DP Palpable Palpable   Gastrointestinal: soft, non-tender/non-distended. No guarding/reflex.  Musculoskeletal: M/S 5/5 throughout.  Extremities without ischemic changes.  No deformity or atrophy. No edema. Neurologic: Sensation grossly intact in extremities.  Symmetrical.  Speech is fluent. Motor exam as listed above. Psychiatric: Judgment intact, Mood & affect appropriate for pt's clinical situation. Dermatologic: No  rashes or ulcers noted.  No cellulitis or open wounds. Lymph : No Cervical, Axillary, or Inguinal lymphadenopathy.  CBC Lab Results  Component Value Date   WBC 7.1 12/23/2019   HGB 14.2 12/23/2019   HCT 44.5 12/23/2019   MCV 96.3 12/23/2019   PLT 194 12/23/2019   BMET    Component Value Date/Time   NA 142 12/23/2019 0815   NA 139 08/02/2019 1049   NA 141 07/28/2013 0444   K 3.5 12/23/2019 0815   K 4.6 07/28/2013 0444   CL 104 12/23/2019 0815   CL 111 (H) 07/28/2013 0444   CO2 24 12/23/2019 0815   CO2 26 07/28/2013 0444   GLUCOSE 118 (H) 12/23/2019 0815   GLUCOSE 86 07/28/2013 0444   BUN 18 12/23/2019 0815   BUN 13 08/02/2019 1049   BUN 6 (L) 07/28/2013 0444   CREATININE 1.25 (H) 12/23/2019 0815   CREATININE 1.13 07/28/2013 0444   CALCIUM 8.6 (L) 12/23/2019 0815   CALCIUM 8.7 07/28/2013 0444   GFRNONAA 56 (L) 12/23/2019 0815   GFRNONAA >60 07/28/2013 0444   GFRAA >60 12/23/2019 0815   GFRAA >60 07/28/2013 0444   Estimated Creatinine Clearance: 48.5 mL/min (A) (by C-G formula based on SCr of 1.25 mg/dL (H)).  COAG Lab Results  Component Value Date   INR 1.0 12/23/2019   INR 1.03 07/18/2015   INR 1.13 05/18/2015   Radiology CT ABDOMEN PELVIS WO CONTRAST  Result Date: 12/07/2019 CLINICAL DATA:  Abdominal pain EXAM: CT ABDOMEN AND PELVIS WITHOUT CONTRAST TECHNIQUE: Multidetector CT imaging of the  abdomen and pelvis was performed following the standard protocol without IV contrast. Oral contrast was administered. COMPARISON:  Abdominal radiographs December 06, 2019; CT abdomen and pelvis May 27, 2015 FINDINGS: Lower chest: There is consolidation in the right base. There is also a small area of consolidation in the inferior, lateral aspect of the right middle lobe. There are foci of coronary artery calcification. Hepatobiliary: No focal liver lesions are evident. Gallbladder is absent. There is no appreciable biliary duct dilatation. Pancreas: There is no pancreatic mass  or inflammatory focus. Spleen: No splenic lesions are evident. Adrenals/Urinary Tract: Adrenals bilaterally appear normal. There is no renal mass or hydronephrosis on either side. There is an extrarenal pelvis on the right, an anatomic variant. There is a 1 mm calculus in the mid right kidney. There is no ureteral calculus on either side. Urinary bladder is midline with wall thickness within normal limits. Stomach/Bowel: Stomach is filled with fluid. There is small bowel dilatation. There is evidence of there is a focal loop of markedly dilated small bowel in the anterior mid abdomen which contains oral contrast. Immediately distal to this localized marked bowel dilatation, there is a transition zone indicating site of small-bowel obstruction. There is contrast which passes through this area. There is no appreciable distal small bowel obstruction. The terminal ileum appears normal. There is fatty infiltration in the ileocecal valve. There is no appreciable colonic dilatation. There is moderate stool in the colon. There is no appreciable free air or portal venous air. Vascular/Lymphatic: There is no abdominal aortic aneurysm. There is localized dilatation of the distal abdominal aorta measuring 3.6 x 3.2 cm with localized saccular dilatation in this area, also present on previous study with slightly greater dilatation compared to the previous study. There is extensive aortic and iliac artery atherosclerosis. Multiple more distal pelvic arterial calcifications are noted. No adenopathy is appreciable in the abdomen or pelvis. Reproductive: Prostate absent.  Seminal vesicles absent. Other: No periappendiceal region inflammation. There is a lower abdominal ventral hernia which contains loops of small bowel. The loop of markedly dilated proximal ileum is seen in this area of herniation; the transition zone does not occur as a consequence of this hernia, however. More superiorly, there is a midline ventral hernia  containing fat but no bowel. This hernia measures 4.7 cm from right to left dimension. There is no abscess or ascites in the abdomen or pelvis. Musculoskeletal: Pars defects are noted at L5 bilaterally. There is 5 mm of anterolisthesis of L5 on S1. No blastic or lytic bone lesions. No intramuscular lesions are evident. IMPRESSION: 1. Small bowel obstruction with transition zone in the proximal to mid ileal region. A loop of markedly dilated small bowel is noted immediately proximal to the transition zone. This loop of small bowel has a maximum transverse diameter of 10.2 cm. This loop of bowel corresponds to the marked focal dilatation on radiograph from 1 day prior. Note that there is no colonic dilatation. No free air. 2. Midline ventral hernias. The larger hernia contains multiple loops of bowel. The dilated loop of proximal ileum immediately proximal to the transition zone is noted within this hernia. However, the transition zone does not appear to represent a consequence of the hernia. There is a smaller midline ventral hernia containing fat but no bowel. 3. Areas of apparent pneumonia in the right middle lobe and right lower lobe regions. 4. Dilatation of the distal aorta with a measured transverse diameter of 3.6 x 3.2 cm, slightly increased from 2017  study. This area has a saccular appearance. Recommend referral to a vascular specialist. This recommendation follows ACR consensus guidelines: White Paper of the ACR Incidental Findings Committee II on Vascular Findings. J Am Coll Radiol 2013; 10:789-794. This recommendation follows ACR consensus guidelines: White Paper of the ACR Incidental Findings Committee II on Vascular Findings. J Am Coll Radiol 2013; 10:789-794. this recommendation follows ACR consensus guidelines: White Paper of the ACR Incidental Findings Committee II on Vascular Findings. J Am Coll Radiol 2013; 10:789-794. 5. Pars defects at L5 bilaterally with 5 mm of anterolisthesis of L5 on S1. 6.  Gallbladder absent. Prostate absent. These results will be called to the ordering clinician or representative by the Radiologist Assistant, and communication documented in the PACS or [employer]. Electronically Signed   By: Lowella Grip III M.D.   On: 12/07/2019 18:22   DG Chest 1 View  Result Date: 12/04/2019 CLINICAL DATA:  Pt with shob,chest pain and covid exposure. Pt with history of copd. Current smoker. shob, maybe covid exposure EXAM: CHEST  1 VIEW COMPARISON:  11/20/2019 FINDINGS: Patient rotated rightward. Normal cardiac silhouette. Potential patchy airspace disease in the RIGHT lower lobe. LEFT lung relatively clear. No pneumothorax. IMPRESSION: Patchy airspace disease in the RIGHT lower lobe could represent early pneumonia. Electronically Signed   By: Suzy Bouchard M.D.   On: 12/04/2019 07:37   DG Chest 2 View  Result Date: 12/18/2019 CLINICAL DATA:  Chest pain EXAM: CHEST - 2 VIEW COMPARISON:  12/04/2019 FINDINGS: Airspace disease in the posterior right lower lobe, subpleural and somewhat focal on the lateral view. No visible effusion or pneumothorax. Normal heart size and mediastinal contours. IMPRESSION: Right lower lobe infiltrate, usually pneumonia. The opacity is subpleural and there is a provided history of chest pain; a CTA could be performed if there is clinical concern for pulmonary infarct. Electronically Signed   By: Monte Fantasia M.D.   On: 12/18/2019 11:40   DG Abd 1 View  Result Date: 12/09/2019 CLINICAL DATA:  Small bowel obstruction EXAM: ABDOMEN - 1 VIEW COMPARISON:  December 06, 2019; CT abdomen and pelvis December 07, 2019 FINDINGS: There remains a loop of dilated bowel stay right of midline in the mid abdomen which on previous CT examination which shown to represent a loop of small bowel. No other appreciable bowel dilatation evident. No air-fluid levels. No free air. Extensive arterial vascular calcification noted. There are surgical clips in the pelvis.  There is patchy opacity in the right lung base. Lung bases otherwise clear. IMPRESSION: Loop of dilated bowel in the mid abdomen remains. No other appreciable bowel dilatation. No free air. Apparent persistent infiltrate right lung base. Electronically Signed   By: Lowella Grip III M.D.   On: 12/09/2019 09:14   CT Angio Chest PE W and/or Wo Contrast  Result Date: 12/23/2019 CLINICAL DATA:  Chest pain, shortness of breath EXAM: CT ANGIOGRAPHY CHEST WITH CONTRAST TECHNIQUE: Multidetector CT imaging of the chest was performed using the standard protocol during bolus administration of intravenous contrast. Multiplanar CT image reconstructions and MIPs were obtained to evaluate the vascular anatomy. CONTRAST:  57mL OMNIPAQUE IOHEXOL 350 MG/ML SOLN COMPARISON:  None. FINDINGS: Cardiovascular: Satisfactory opacification of the pulmonary arteries to the segmental level. Positive examination for pulmonary embolism with a large burden of embolus present in the distal left pulmonary artery and lobar through segmental branches. There is a smaller burden of lobar to segmental embolus in the right lung. Normal heart size. The RV LV ratio is enlarged,  approximately 1.5-1. No pericardial effusion. Aortic atherosclerosis. Three-vessel coronary artery calcifications. Mediastinum/Nodes: No enlarged mediastinal, hilar, or axillary lymph nodes. Frothy debris in the lower trachea. Thyroid gland and esophagus demonstrate no significant findings. Lungs/Pleura: Severe centrilobular emphysema. Diffuse bilateral bronchial wall thickening. There is extensive heterogeneous airspace opacity predominantly in the dependent right lower lobe with associated atelectasis or consolidation and a small right pleural effusion. There is calcification of the visceral pleura of the right lower lobe. Upper Abdomen: No acute abnormality. Musculoskeletal: No chest wall abnormality. No acute or significant osseous findings. Review of the MIP images  confirms the above findings. IMPRESSION: 1. Positive examination for pulmonary embolism with a large burden of embolus present in the distal left pulmonary artery and lobar through segmental branches. There is a smaller burden of lobar to segmental embolus in the right lung. 2. The RV LV ratio is enlarged, approximately 1.5-1, concerning for right heart strain. 3. There is extensive heterogeneous airspace opacity predominantly in the dependent right lower lobe with associated atelectasis or consolidation, concerning for infection or aspiration. This is in general not distal to occlusive embolus and does not likely reflect pulmonary infarction. 4. Small right pleural effusion with calcification of the visceral pleura of the right lower lobe, likely chronic and loculated. 5. Severe centrilobular emphysema. Emphysema (ICD10-J43.9). 6. Coronary artery disease.  Aortic Atherosclerosis (ICD10-I70.0). These results were called by telephone at the time of interpretation on 12/23/2019 at 10:31 am to Dr. Delman Kitten , who verbally acknowledged these results. Electronically Signed   By: Eddie Candle M.D.   On: 12/23/2019 10:36   DG Chest Port 1 View  Result Date: 12/23/2019 CLINICAL DATA:  Decreased oxygen saturation EXAM: PORTABLE CHEST 1 VIEW COMPARISON:  December 18, 2019 FINDINGS: There is persistent airspace opacity in the right mid and lower lung regions. There is a minimal right pleural effusion. The left lung is clear. Heart size and pulmonary vascularity are normal. No adenopathy. No bone lesions. IMPRESSION: Airspace opacity, most likely representing pneumonia in the mid and lower lung regions on the right with slightly more consolidation compared to most recent study. Rather minimal pleural effusion on the right noted. Left lung clear. Cardiac silhouette normal. Electronically Signed   By: Lowella Grip III M.D.   On: 12/23/2019 08:48   DG Abd Portable 1V  Result Date: 12/06/2019 CLINICAL DATA:  Cough,  shortness of breath EXAM: PORTABLE ABDOMEN - 1 VIEW COMPARISON:  Radiograph 05/20/2015 FINDINGS: Markedly distended loop of bowel seen in the mid abdomen, unclear if this reflects a loop of large or small bowel. Several additional loops of air-filled bowel with questionable fold thickening are noted in the immediate vicinity. Air and stool projects over the rectal vault. Moderate proximal colonic stool burden is noted. Cholecystectomy clips in the right upper quadrant. Vascular stenting, calcium and additional surgical clips throughout the pelvis. Degenerative changes in the spine, hips and pelvis. IMPRESSION: 1. Markedly distended loop of bowel in the mid abdomen, unclear if this reflects a loop of large or small bowel. Several additional loops of air-filled bowel with questionable fold thickening are noted in the immediate vicinity. Findings raise concern for a bowel obstruction. Consider further evaluation with CT. Electronically Signed   By: Lovena Le M.D.   On: 12/06/2019 16:01   Assessment/Plan Brady Smith is a 74 y.o. male with medical history significant for nicotine dependence, COPD, depression, GERD, peripheral vascular disease who was brought to the ER by EMS for evaluation of shortness of breath.  1. Pulmonary Embolism: Patient with progressively worsening shortness of breath which prompted him to seek medical attention in the emergency department.  Found to have:  Positive examination for pulmonary embolism with a large burden of embolus present in the distal left pulmonary artery and lobar through segmental branches. There is a smaller burden of lobar to segmental embolus in the right lung. With heart strain.  In the setting of PE with large clot burden, right heart strain and progressively worsening shortness of breath recommend undergoing a pulmonary thrombectomy/thrombolysis attempt to lessen the clot burden, improve symptoms and lessen right heart strain.  Procedure, risks and  benefits were explained to the patient.  All questions were answered.  The patient wished to proceed.  Agree with initiation of heparin.  2. DVT: Pending bilateral lower extremity venous duplex  3. Anticoagulation: Patient understands that he will need to be in oral anticoagulation for at least a year. We discussed the importance of compliance with medication and follow-up.  Discussed with [place], PA-C  12/23/2019 11:42 AM  This note was created with Dragon medical transcription system.  Any error is purely unintentional

## 2019-12-23 NOTE — ED Notes (Signed)
Meds given per order at this time.   Pt given graham crackers, apple sauce, and ice water upon request at this time.   Pt denies further needs at this time

## 2019-12-23 NOTE — ED Notes (Signed)
MD Agbata at the bedside at this time.

## 2019-12-23 NOTE — ED Triage Notes (Signed)
Per EMS, on arrival pt had sats 88%, but when he started talking his sats dropped to 78% on RA, pt placed on 15L with EMS, given duoneb which pt reported helped.  On arrival to ED pt was on 3L [place].

## 2019-12-23 NOTE — Consult Note (Signed)
Brady Smith for Heparin  Indication: pulmonary embolus  Allergies  Allergen Reactions  . Influenza Vaccines     High blood pressure- had to be admitted  . Lipitor [Atorvastatin Calcium]     Muscle pain    Patient Measurements: Height: 5\' 7"  (170.2 cm) Weight: 68 kg (150 lb) IBW/kg (Calculated) : 66.1 Heparin Dosing Weight: 68 kg   Vital Signs: Temp: 97.8 F (36.6 C) (09/30 0809) Temp Source: Oral (09/30 0809) BP: 126/88 (09/30 1001) Pulse Rate: 95 (09/30 1001)  Labs: Recent Labs    12/23/19 0815  HGB 14.2  HCT 44.5  PLT 194  APTT 27  LABPROT 12.5  INR 1.0  CREATININE 1.25*  TROPONINIHS 27*    Estimated Creatinine Clearance: 48.5 mL/min (A) (by C-G formula based on SCr of 1.25 mg/dL (H)).   Medications:  Confirmed w/patient no PTA anticoagulants   Assessment: Pharmacy has been consulted for heparin dosing a patient with a PE. Baseline labs are appropriate to start heparin.   Goal of Therapy:  Heparin level 0.3-0.7 units/ml Monitor platelets by anticoagulation protocol: Yes   Plan:  Baseline labs have been ordered  Heparin DW: 68 kg Give 3400 units bolus x 1 Start heparin infusion at 1050 units/hr Check anti-Xa level in 8 hours (at 1930) and daily while on heparin, per protocol Continue to monitor H&H and platelets   Brady Smith Brady Smith 12/23/2019,11:05 AM

## 2019-12-23 NOTE — Consult Note (Signed)
[facility] Consult Note  MRN : [id number]  Brady Smith is a 74 y.o. (Jun 20, 1945) male who presents with chief complaint of  Chief Complaint  Patient presents with  . Shortness of Breath   History of Present Illness:  Brady Smith is a 74 y.o. male with medical history significant for nicotine dependence, COPD, depression, GERD, peripheral vascular disease who was brought to the ER by EMS for evaluation of shortness of breath.  Patient states that he has had shortness of breath for couple of weeks initially with exertion but now he is short of breath at rest.  He had gone to the [facility] to eat breakfast when he suddenly had worsening shortness of breath and so EMS was called.  Upon EMS arrival he had room air pulse oximetry of 88% but with conversation his pulse oximetry dropped to 78% and he was placed on 15 L of oxygen.  He has been weaned down to 4 L of oxygen via nasal cannula with pulse oximetry greater than 92%. Patient was recently hospitalized for about a week for community-acquired pneumonia from September 11 through the 18th, 2021.  He has been to the emergency room once since his discharge for evaluation of shortness of breath and lower extremity swelling but left without being seen.  He denies having any fever or chills, he has a cough productive of clear phlegm, denies having any chest pain, no nausea, no vomiting, no diaphoresis or palpitations, no dizziness, no lightheadedness or loss of consciousness. He denies any past medical hx of DVT, recent surgery / trauma, prolonged immobility.   12/23/19: 1. Positive examination for pulmonary embolism with a large burden of embolus present in the distal left pulmonary artery and lobar through segmental branches. There is a smaller burden of lobar to segmental embolus in the right lung. 2. The RV LV ratio is enlarged, approximately 1.5-1, concerning for right heart strain. 3. There is  extensive heterogeneous airspace opacity predominantly in the dependent right lower lobe with associated atelectasis or consolidation, concerning for infection or aspiration. This is in general not distal to occlusive embolus and does not likely reflect pulmonary infarction. 4. Small right pleural effusion with calcification of the visceral pleura of the right lower lobe, likely chronic and loculated. 5. Severe centrilobular emphysema. Emphysema (ICD10-J43.9). 6. Coronary artery disease.  Aortic Atherosclerosis (ICD10-I70.0).  Vascular Surgery was consulted by Dr. Francine Graven for possible Thrombectomy / Thrombolysis.  Current Facility-Administered Medications  Medication Dose Route Frequency Provider Last Rate Last Admin  . 0.9 %  sodium chloride infusion   Intravenous Continuous Agbata, Tochukwu, MD      . Ampicillin-Sulbactam (UNASYN) 3 g in sodium chloride 0.9 % 100 mL IVPB  3 g Intravenous Q6H Duncan, Asajah R, RPH      . aspirin EC tablet 81 mg  81 mg Oral QODAY Agbata, Tochukwu, MD      . clonazePAM (KLONOPIN) disintegrating tablet 0.25 mg  0.25 mg Oral TID Agbata, Tochukwu, MD      . docusate sodium (COLACE) capsule 100 mg  100 mg Oral BID Agbata, Tochukwu, MD      . gabapentin (NEURONTIN) capsule 100 mg  100 mg Oral QHS Agbata, Tochukwu, MD      . heparin ADULT infusion 100 units/mL (25000 units/290mL sodium chloride 0.45%)  1,050 Units/hr Intravenous Continuous Rowland Lathe, RPH 10.5 mL/hr at 12/23/19 1135 1,050 Units/hr at 12/23/19 1135  . ipratropium-albuterol (DUONEB) 0.5-2.5 (3) MG/3ML nebulizer solution 3  mL  3 mL Nebulization TID Agbata, Tochukwu, MD      . mometasone-formoterol (DULERA) 100-5 MCG/ACT inhaler 2 puff  2 puff Inhalation BID Agbata, Tochukwu, MD      . multivitamin with minerals tablet 1 tablet  1 tablet Oral Daily Agbata, Tochukwu, MD      . Derrill Memo ON 12/24/2019] nicotine (NICODERM CQ - dosed in mg/24 hours) patch 14 mg  14 mg Transdermal Daily Agbata, Tochukwu, MD       . nicotine (NICODERM CQ - dosed in mg/24 hours) patch 21 mg  21 mg Transdermal Once Delman Kitten, MD   21 mg at 12/23/19 0849  . OLANZapine (ZYPREXA) tablet 1.25 mg  1.25 mg Oral QHS Agbata, Tochukwu, MD      . ondansetron (ZOFRAN) tablet 4 mg  4 mg Oral Q6H PRN Agbata, Tochukwu, MD       Or  . ondansetron (ZOFRAN) injection 4 mg  4 mg Intravenous Q6H PRN Agbata, Tochukwu, MD      . sucralfate (CARAFATE) tablet 1 g  1 g Oral TID WC & HS Agbata, Tochukwu, MD      . triamcinolone ointment (KENALOG) 0.5 % 1 application  1 application Topical BID Agbata, Tochukwu, MD       Current Outpatient Medications  Medication Sig Dispense Refill  . albuterol (VENTOLIN HFA) 108 (90 Base) MCG/ACT inhaler Inhale 2 puffs into the lungs every 6 (six) hours as needed for wheezing or shortness of breath. 8.5 g 5  . amLODipine (NORVASC) 10 MG tablet Take 1 tablet (10 mg total) by mouth daily. 30 tablet 2  . aspirin EC 81 MG EC tablet Take 1 tablet (81 mg total) by mouth every other day. Swallow whole. 30 tablet 11  . clonazePAM (KLONOPIN) 0.25 MG disintegrating tablet Take 1 tablet (0.25 mg total) by mouth 3 (three) times daily. 60 tablet 2  . Cyanocobalamin (VITAMIN B-12 IJ) Inject 1,000 mcg into the muscle every 30 (thirty) days.     Marland Kitchen docusate sodium (COLACE) 100 MG capsule Take 1 capsule (100 mg total) by mouth 2 (two) times daily. 60 capsule 2  . gabapentin (NEURONTIN) 100 MG capsule Take 1 capsule (100 mg total) by mouth at bedtime. 30 capsule 2  . ipratropium-albuterol (DUONEB) 0.5-2.5 (3) MG/3ML SOLN Take 3 mLs by nebulization 3 (three) times daily. 360 mL 3  . Multiple Vitamin (MULTIVITAMIN WITH MINERALS) TABS tablet Take 1 tablet by mouth daily. 30 tablet 3  . nicotine (NICODERM CQ - DOSED IN MG/24 HOURS) 14 mg/24hr patch Place 1 patch (14 mg total) onto the skin daily. 28 patch 0  . OLANZapine (ZYPREXA) 2.5 MG tablet Take 0.5 tablets (1.25 mg total) by mouth at bedtime. 30 tablet 2  . sucralfate  (CARAFATE) 1 g tablet Take 1 tablet (1 g total) by mouth 4 (four) times daily -  with meals and at bedtime. 42 tablet 3  . triamcinolone ointment (KENALOG) 0.5 % Apply 1 application topically 2 (two) times daily. To rash on leg 30 g 2   Past Medical History:  Diagnosis Date  . Anxiety   . Bladder cancer ([place])   . COPD (chronic obstructive pulmonary disease) ([place])   . Depression   . GERD (gastroesophageal reflux disease)   . History of kidney cancer   . Peripheral vascular disease ([place])   . Pneumonia    Past Surgical History:  Procedure Laterality Date  . AMPUTATION Right 07/19/2015   Procedure: AMPUTATION DIGIT ( RIGHT FOOT FIFTH  TOE );  Surgeon: Algernon Huxley, MD;  Location: [facility];  Service: Vascular;  Laterality: Right;  . BOWEL RESECTION  05/20/2015   Procedure: SMALL BOWEL RESECTION;  Surgeon: Clayburn Pert, MD;  Location: [facility];  Service: General;;  . CATARACT EXTRACTION W/PHACO Left 10/06/2018   Procedure: CATARACT EXTRACTION PHACO AND INTRAOCULAR LENS PLACEMENT ([place])  LEFT;  Surgeon: Birder Robson, MD;  Location: [facility];  Service: Ophthalmology;  Laterality: Left;  DAY OF TEST. LIVES IN BOARDING HOUSE  . CATARACT EXTRACTION W/PHACO Right 10/27/2018   Procedure: CATARACT EXTRACTION PHACO AND INTRAOCULAR LENS PLACEMENT ([place])  RIGHT;  Surgeon: Birder Robson, MD;  Location: [place];  Service: Ophthalmology;  Laterality: Right;  PT HAS TO HAVE COVID TEST MORNING OF LEAVE AT LAST PATIENT  . CHOLECYSTECTOMY    . COLONOSCOPY  2000  . CYSTECTOMY W/ CONTINENT DIVERSION  1996  . HERNIA REPAIR    . LAPAROTOMY N/A 05/20/2015   Procedure: EXPLORATORY LAPAROTOMY;  Surgeon: Clayburn Pert, MD;  Location: [facility];  Service: General;  Laterality: N/A;  . PERIPHERAL VASCULAR CATHETERIZATION N/A 07/03/2015   Procedure: Abdominal Aortogram w/Lower Extremity;  Surgeon: Algernon Huxley, MD;  Location: [place] [facility];  Service: Cardiovascular;  Laterality: N/A;  .  PERIPHERAL VASCULAR CATHETERIZATION  07/03/2015   Procedure: Lower Extremity Intervention;  Surgeon: Algernon Huxley, MD;  Location: [place] [facility];  Service: Cardiovascular;;  . PROSTATECTOMY  1996  . TONSILLECTOMY     Social History Social History   Tobacco Use  . Smoking status: Current Every Day Smoker    Packs/day: 0.25    Years: 65.00    Pack years: 16.25    Types: Cigarettes  . Smokeless tobacco: Never Used  . Tobacco comment: reduced # of packs smoked to 5-6 ciggs daily from 3 pks daily   Vaping Use  . Vaping Use: Never used  Substance Use Topics  . Alcohol use: Yes    Alcohol/week: 12.0 standard drinks    Types: 12 Cans of beer per week  . Drug use: No   Family History Family History  Problem Relation Age of Onset  . Heart disease Mother   . Heart disease Father   . COPD Sister     Allergies  Allergen Reactions  . Influenza Vaccines     High blood pressure- had to be admitted  . Lipitor [Atorvastatin Calcium]     Muscle pain  Denies family history of peripheral artery disease, venous disease or renal disease.  REVIEW OF SYSTEMS (Negative unless checked)  Constitutional: [] Weight loss  [] Fever  [] Chills Cardiac: [] Chest pain   [] Chest pressure   [] Palpitations   [x] Shortness of breath when laying flat   [x] Shortness of breath at rest   [x] Shortness of breath with exertion. Vascular:  [] Pain in legs with walking   [] Pain in legs at rest   [] Pain in legs when laying flat   [] Claudication   [] Pain in feet when walking  [] Pain in feet at rest  [] Pain in feet when laying flat   [] History of DVT   [] Phlebitis   [] Swelling in legs   [] Varicose veins   [] Non-healing ulcers Pulmonary:   [] Uses home oxygen   [] Productive cough   [] Hemoptysis   [] Wheeze  [] COPD   [] Asthma Neurologic:  [] Dizziness  [] Blackouts   [] Seizures   [] History of stroke   [] History of TIA  [] Aphasia   [] Temporary blindness   [] Dysphagia   [] Weakness or numbness in  arms   [] Weakness or numbness in  legs Musculoskeletal:  [] Arthritis   [] Joint swelling   [] Joint pain   [] Low back pain Hematologic:  [] Easy bruising  [] Easy bleeding   [] Hypercoagulable state   [] Anemic  [] Hepatitis Gastrointestinal:  [] Blood in stool   [] Vomiting blood  [] Gastroesophageal reflux/heartburn   [] Difficulty swallowing. Genitourinary:  [] Chronic kidney disease   [] Difficult urination  [] Frequent urination  [] Burning with urination   [] Blood in urine Skin:  [] Rashes   [] Ulcers   [] Wounds Psychological:  [] History of anxiety   []  History of major depression.  Physical Examination  Vitals:   12/23/19 1001 12/23/19 1030 12/23/19 1100 12/23/19 1130  BP: 126/88 139/81 127/76 111/72  Pulse: 95 (!) 104 96 90  Resp: (!) 26 20 (!) 26 (!) 23  Temp:      TempSrc:      SpO2: 94% 96% 95% 94%  Weight:      Height:       Body mass index is 23.49 kg/m. Gen:  WD/WN, NAD Head: [place]/AT, No temporalis wasting. Prominent temp pulse not noted. Ear/Nose/Throat: Hearing grossly intact, nares w/o erythema or drainage, oropharynx w/o Erythema/Exudate Eyes: Sclera non-icteric, conjunctiva clear Neck: Trachea midline.  No JVD.  Pulmonary:  Good air movement, respirations not labored @ rest, equal bilaterally.  Cardiac: RRR, normal S1, S2. Vascular:  Vessel Right Left  Radial Palpable Palpable  Ulnar Palpable Palpable  Brachial Palpable Palpable  Carotid Palpable, without bruit Palpable, without bruit  Aorta Not palpable N/A  Femoral Palpable Palpable  Popliteal Palpable Palpable  PT Palpable Palpable  DP Palpable Palpable   Gastrointestinal: soft, non-tender/non-distended. No guarding/reflex.  Musculoskeletal: M/S 5/5 throughout.  Extremities without ischemic changes.  No deformity or atrophy. No edema. Neurologic: Sensation grossly intact in extremities.  Symmetrical.  Speech is fluent. Motor exam as listed above. Psychiatric: Judgment intact, Mood & affect appropriate for pt's clinical situation. Dermatologic: No  rashes or ulcers noted.  No cellulitis or open wounds. Lymph : No Cervical, Axillary, or Inguinal lymphadenopathy.  CBC Lab Results  Component Value Date   WBC 7.1 12/23/2019   HGB 14.2 12/23/2019   HCT 44.5 12/23/2019   MCV 96.3 12/23/2019   PLT 194 12/23/2019   BMET    Component Value Date/Time   NA 142 12/23/2019 0815   NA 139 08/02/2019 1049   NA 141 07/28/2013 0444   K 3.5 12/23/2019 0815   K 4.6 07/28/2013 0444   CL 104 12/23/2019 0815   CL 111 (H) 07/28/2013 0444   CO2 24 12/23/2019 0815   CO2 26 07/28/2013 0444   GLUCOSE 118 (H) 12/23/2019 0815   GLUCOSE 86 07/28/2013 0444   BUN 18 12/23/2019 0815   BUN 13 08/02/2019 1049   BUN 6 (L) 07/28/2013 0444   CREATININE 1.25 (H) 12/23/2019 0815   CREATININE 1.13 07/28/2013 0444   CALCIUM 8.6 (L) 12/23/2019 0815   CALCIUM 8.7 07/28/2013 0444   GFRNONAA 56 (L) 12/23/2019 0815   GFRNONAA >60 07/28/2013 0444   GFRAA >60 12/23/2019 0815   GFRAA >60 07/28/2013 0444   Estimated Creatinine Clearance: 48.5 mL/min (A) (by C-G formula based on SCr of 1.25 mg/dL (H)).  COAG Lab Results  Component Value Date   INR 1.0 12/23/2019   INR 1.03 07/18/2015   INR 1.13 05/18/2015   Radiology CT ABDOMEN PELVIS WO CONTRAST  Result Date: 12/07/2019 CLINICAL DATA:  Abdominal pain EXAM: CT ABDOMEN AND PELVIS WITHOUT CONTRAST TECHNIQUE: Multidetector CT imaging of the  abdomen and pelvis was performed following the standard protocol without IV contrast. Oral contrast was administered. COMPARISON:  Abdominal radiographs December 06, 2019; CT abdomen and pelvis May 27, 2015 FINDINGS: Lower chest: There is consolidation in the right base. There is also a small area of consolidation in the inferior, lateral aspect of the right middle lobe. There are foci of coronary artery calcification. Hepatobiliary: No focal liver lesions are evident. Gallbladder is absent. There is no appreciable biliary duct dilatation. Pancreas: There is no pancreatic mass  or inflammatory focus. Spleen: No splenic lesions are evident. Adrenals/Urinary Tract: Adrenals bilaterally appear normal. There is no renal mass or hydronephrosis on either side. There is an extrarenal pelvis on the right, an anatomic variant. There is a 1 mm calculus in the mid right kidney. There is no ureteral calculus on either side. Urinary bladder is midline with wall thickness within normal limits. Stomach/Bowel: Stomach is filled with fluid. There is small bowel dilatation. There is evidence of there is a focal loop of markedly dilated small bowel in the anterior mid abdomen which contains oral contrast. Immediately distal to this localized marked bowel dilatation, there is a transition zone indicating site of small-bowel obstruction. There is contrast which passes through this area. There is no appreciable distal small bowel obstruction. The terminal ileum appears normal. There is fatty infiltration in the ileocecal valve. There is no appreciable colonic dilatation. There is moderate stool in the colon. There is no appreciable free air or portal venous air. Vascular/Lymphatic: There is no abdominal aortic aneurysm. There is localized dilatation of the distal abdominal aorta measuring 3.6 x 3.2 cm with localized saccular dilatation in this area, also present on previous study with slightly greater dilatation compared to the previous study. There is extensive aortic and iliac artery atherosclerosis. Multiple more distal pelvic arterial calcifications are noted. No adenopathy is appreciable in the abdomen or pelvis. Reproductive: Prostate absent.  Seminal vesicles absent. Other: No periappendiceal region inflammation. There is a lower abdominal ventral hernia which contains loops of small bowel. The loop of markedly dilated proximal ileum is seen in this area of herniation; the transition zone does not occur as a consequence of this hernia, however. More superiorly, there is a midline ventral hernia  containing fat but no bowel. This hernia measures 4.7 cm from right to left dimension. There is no abscess or ascites in the abdomen or pelvis. Musculoskeletal: Pars defects are noted at L5 bilaterally. There is 5 mm of anterolisthesis of L5 on S1. No blastic or lytic bone lesions. No intramuscular lesions are evident. IMPRESSION: 1. Small bowel obstruction with transition zone in the proximal to mid ileal region. A loop of markedly dilated small bowel is noted immediately proximal to the transition zone. This loop of small bowel has a maximum transverse diameter of 10.2 cm. This loop of bowel corresponds to the marked focal dilatation on radiograph from 1 day prior. Note that there is no colonic dilatation. No free air. 2. Midline ventral hernias. The larger hernia contains multiple loops of bowel. The dilated loop of proximal ileum immediately proximal to the transition zone is noted within this hernia. However, the transition zone does not appear to represent a consequence of the hernia. There is a smaller midline ventral hernia containing fat but no bowel. 3. Areas of apparent pneumonia in the right middle lobe and right lower lobe regions. 4. Dilatation of the distal aorta with a measured transverse diameter of 3.6 x 3.2 cm, slightly increased from 2017  study. This area has a saccular appearance. Recommend referral to a vascular specialist. This recommendation follows ACR consensus guidelines: White Paper of the ACR Incidental Findings Committee II on Vascular Findings. J Am Coll Radiol 2013; 10:789-794. This recommendation follows ACR consensus guidelines: White Paper of the ACR Incidental Findings Committee II on Vascular Findings. J Am Coll Radiol 2013; 10:789-794. this recommendation follows ACR consensus guidelines: White Paper of the ACR Incidental Findings Committee II on Vascular Findings. J Am Coll Radiol 2013; 10:789-794. 5. Pars defects at L5 bilaterally with 5 mm of anterolisthesis of L5 on S1. 6.  Gallbladder absent. Prostate absent. These results will be called to the ordering clinician or representative by the Radiologist Assistant, and communication documented in the PACS or [employer]. Electronically Signed   By: Lowella Grip III M.D.   On: 12/07/2019 18:22   DG Chest 1 View  Result Date: 12/04/2019 CLINICAL DATA:  Pt with shob,chest pain and covid exposure. Pt with history of copd. Current smoker. shob, maybe covid exposure EXAM: CHEST  1 VIEW COMPARISON:  11/20/2019 FINDINGS: Patient rotated rightward. Normal cardiac silhouette. Potential patchy airspace disease in the RIGHT lower lobe. LEFT lung relatively clear. No pneumothorax. IMPRESSION: Patchy airspace disease in the RIGHT lower lobe could represent early pneumonia. Electronically Signed   By: Suzy Bouchard M.D.   On: 12/04/2019 07:37   DG Chest 2 View  Result Date: 12/18/2019 CLINICAL DATA:  Chest pain EXAM: CHEST - 2 VIEW COMPARISON:  12/04/2019 FINDINGS: Airspace disease in the posterior right lower lobe, subpleural and somewhat focal on the lateral view. No visible effusion or pneumothorax. Normal heart size and mediastinal contours. IMPRESSION: Right lower lobe infiltrate, usually pneumonia. The opacity is subpleural and there is a provided history of chest pain; a CTA could be performed if there is clinical concern for pulmonary infarct. Electronically Signed   By: Monte Fantasia M.D.   On: 12/18/2019 11:40   DG Abd 1 View  Result Date: 12/09/2019 CLINICAL DATA:  Small bowel obstruction EXAM: ABDOMEN - 1 VIEW COMPARISON:  December 06, 2019; CT abdomen and pelvis December 07, 2019 FINDINGS: There remains a loop of dilated bowel stay right of midline in the mid abdomen which on previous CT examination which shown to represent a loop of small bowel. No other appreciable bowel dilatation evident. No air-fluid levels. No free air. Extensive arterial vascular calcification noted. There are surgical clips in the pelvis.  There is patchy opacity in the right lung base. Lung bases otherwise clear. IMPRESSION: Loop of dilated bowel in the mid abdomen remains. No other appreciable bowel dilatation. No free air. Apparent persistent infiltrate right lung base. Electronically Signed   By: Lowella Grip III M.D.   On: 12/09/2019 09:14   CT Angio Chest PE W and/or Wo Contrast  Result Date: 12/23/2019 CLINICAL DATA:  Chest pain, shortness of breath EXAM: CT ANGIOGRAPHY CHEST WITH CONTRAST TECHNIQUE: Multidetector CT imaging of the chest was performed using the standard protocol during bolus administration of intravenous contrast. Multiplanar CT image reconstructions and MIPs were obtained to evaluate the vascular anatomy. CONTRAST:  53mL OMNIPAQUE IOHEXOL 350 MG/ML SOLN COMPARISON:  None. FINDINGS: Cardiovascular: Satisfactory opacification of the pulmonary arteries to the segmental level. Positive examination for pulmonary embolism with a large burden of embolus present in the distal left pulmonary artery and lobar through segmental branches. There is a smaller burden of lobar to segmental embolus in the right lung. Normal heart size. The RV LV ratio is enlarged,  approximately 1.5-1. No pericardial effusion. Aortic atherosclerosis. Three-vessel coronary artery calcifications. Mediastinum/Nodes: No enlarged mediastinal, hilar, or axillary lymph nodes. Frothy debris in the lower trachea. Thyroid gland and esophagus demonstrate no significant findings. Lungs/Pleura: Severe centrilobular emphysema. Diffuse bilateral bronchial wall thickening. There is extensive heterogeneous airspace opacity predominantly in the dependent right lower lobe with associated atelectasis or consolidation and a small right pleural effusion. There is calcification of the visceral pleura of the right lower lobe. Upper Abdomen: No acute abnormality. Musculoskeletal: No chest wall abnormality. No acute or significant osseous findings. Review of the MIP images  confirms the above findings. IMPRESSION: 1. Positive examination for pulmonary embolism with a large burden of embolus present in the distal left pulmonary artery and lobar through segmental branches. There is a smaller burden of lobar to segmental embolus in the right lung. 2. The RV LV ratio is enlarged, approximately 1.5-1, concerning for right heart strain. 3. There is extensive heterogeneous airspace opacity predominantly in the dependent right lower lobe with associated atelectasis or consolidation, concerning for infection or aspiration. This is in general not distal to occlusive embolus and does not likely reflect pulmonary infarction. 4. Small right pleural effusion with calcification of the visceral pleura of the right lower lobe, likely chronic and loculated. 5. Severe centrilobular emphysema. Emphysema (ICD10-J43.9). 6. Coronary artery disease.  Aortic Atherosclerosis (ICD10-I70.0). These results were called by telephone at the time of interpretation on 12/23/2019 at 10:31 am to Dr. Delman Kitten , who verbally acknowledged these results. Electronically Signed   By: Eddie Candle M.D.   On: 12/23/2019 10:36   DG Chest Port 1 View  Result Date: 12/23/2019 CLINICAL DATA:  Decreased oxygen saturation EXAM: PORTABLE CHEST 1 VIEW COMPARISON:  December 18, 2019 FINDINGS: There is persistent airspace opacity in the right mid and lower lung regions. There is a minimal right pleural effusion. The left lung is clear. Heart size and pulmonary vascularity are normal. No adenopathy. No bone lesions. IMPRESSION: Airspace opacity, most likely representing pneumonia in the mid and lower lung regions on the right with slightly more consolidation compared to most recent study. Rather minimal pleural effusion on the right noted. Left lung clear. Cardiac silhouette normal. Electronically Signed   By: Lowella Grip III M.D.   On: 12/23/2019 08:48   DG Abd Portable 1V  Result Date: 12/06/2019 CLINICAL DATA:  Cough,  shortness of breath EXAM: PORTABLE ABDOMEN - 1 VIEW COMPARISON:  Radiograph 05/20/2015 FINDINGS: Markedly distended loop of bowel seen in the mid abdomen, unclear if this reflects a loop of large or small bowel. Several additional loops of air-filled bowel with questionable fold thickening are noted in the immediate vicinity. Air and stool projects over the rectal vault. Moderate proximal colonic stool burden is noted. Cholecystectomy clips in the right upper quadrant. Vascular stenting, calcium and additional surgical clips throughout the pelvis. Degenerative changes in the spine, hips and pelvis. IMPRESSION: 1. Markedly distended loop of bowel in the mid abdomen, unclear if this reflects a loop of large or small bowel. Several additional loops of air-filled bowel with questionable fold thickening are noted in the immediate vicinity. Findings raise concern for a bowel obstruction. Consider further evaluation with CT. Electronically Signed   By: Lovena Le M.D.   On: 12/06/2019 16:01   Assessment/Plan Brady Smith is a 74 y.o. male with medical history significant for nicotine dependence, COPD, depression, GERD, peripheral vascular disease who was brought to the ER by EMS for evaluation of shortness of breath.  1. Pulmonary Embolism: Patient with progressively worsening shortness of breath which prompted him to seek medical attention in the emergency department.  Found to have:  Positive examination for pulmonary embolism with a large burden of embolus present in the distal left pulmonary artery and lobar through segmental branches. There is a smaller burden of lobar to segmental embolus in the right lung. With heart strain.  In the setting of PE with large clot burden, right heart strain and progressively worsening shortness of breath recommend undergoing a pulmonary thrombectomy/thrombolysis attempt to lessen the clot burden, improve symptoms and lessen right heart strain.  Procedure, risks and  benefits were explained to the patient.  All questions were answered.  The patient wished to proceed.  Agree with initiation of heparin.  2. DVT: Pending bilateral lower extremity venous duplex  3. Anticoagulation: Patient understands that he will need to be in oral anticoagulation for at least a year. We discussed the importance of compliance with medication and follow-up.  Discussed with [place], PA-C  12/23/2019 11:42 AM  This note was created with Dragon medical transcription system.  Any error is purely unintentional
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
## 2019-12-24 ENCOUNTER — Encounter
Admission: EM | Disposition: A | Discharge status: Home / Self Care | Payer: Self-pay | Source: Home / Self Care | Attending: Internal Medicine

## 2019-12-24 ENCOUNTER — Encounter: Payer: Self-pay | Admitting: Internal Medicine

## 2019-12-24 ENCOUNTER — Inpatient Hospital Stay (HOSPITAL_COMMUNITY)
Admit: 2019-12-24 | Discharge: 2019-12-24 | Disposition: A | Discharge status: Home / Self Care | Payer: Medicare Other | Attending: Internal Medicine | Admitting: Internal Medicine

## 2019-12-24 DIAGNOSIS — I5031 Acute diastolic (congestive) heart failure: Secondary | ICD-10-CM

## 2019-12-24 DIAGNOSIS — R0902 Hypoxemia: Secondary | ICD-10-CM

## 2019-12-24 DIAGNOSIS — I2699 Other pulmonary embolism without acute cor pulmonale: Secondary | ICD-10-CM

## 2019-12-24 DIAGNOSIS — I480 Paroxysmal atrial fibrillation: Secondary | ICD-10-CM

## 2019-12-24 HISTORY — PX: PULMONARY THROMBECTOMY: CATH118295

## 2019-12-24 LAB — BASIC METABOLIC PANEL
Anion gap: 9 (ref 5–15)
BUN: 16 mg/dL (ref 8–23)
CO2: 22 mmol/L (ref 22–32)
Calcium: 8.1 mg/dL — ABNORMAL LOW (ref 8.9–10.3)
Chloride: 110 mmol/L (ref 98–111)
Creatinine, Ser: 0.97 mg/dL (ref 0.61–1.24)
GFR calc Af Amer: >60 mL/min (ref >60)
GFR calc non Af Amer: >60 mL/min (ref >60)
Glucose, Bld: 137 mg/dL — ABNORMAL HIGH (ref 70–99)
Potassium: 3.6 mmol/L (ref 3.5–5.1)
Sodium: 141 mmol/L (ref 135–145)

## 2019-12-24 LAB — ECHOCARDIOGRAM COMPLETE
AR max vel: 2.5 cm2
AV Area VTI: 2.81 cm2
AV Area mean vel: 2.4 cm2
AV Mean grad: 2 mmHg
AV Peak grad: 4.5 mmHg
Ao pk vel: 1.06 m/s
Area-P 1/2: 3.72 cm2
Height: 67 in
S' Lateral: 2.6 cm
Weight: 2468.8 oz

## 2019-12-24 LAB — HEPARIN LEVEL (UNFRACTIONATED): Heparin Unfractionated: 0.18 IU/mL — ABNORMAL LOW (ref 0.30–0.70)

## 2019-12-24 LAB — CBC
HCT: 36.4 % — ABNORMAL LOW (ref 39.0–52.0)
Hemoglobin: 11.4 g/dL — ABNORMAL LOW (ref 13.0–17.0)
MCH: 30.5 pg (ref 26.0–34.0)
MCHC: 31.3 g/dL (ref 30.0–36.0)
MCV: 97.3 fL (ref 80.0–100.0)
Platelets: 170 10*3/uL (ref 150–400)
RBC: 3.74 MIL/uL — ABNORMAL LOW (ref 4.22–5.81)
RDW: 13.2 % (ref 11.5–15.5)
WBC: 10 10*3/uL (ref 4.0–10.5)
nRBC: 0 % (ref 0.0–0.2)

## 2019-12-24 SURGERY — PULMONARY THROMBECTOMY
Anesthesia: Moderate Sedation

## 2019-12-24 MED ORDER — CEFAZOLIN SODIUM-DEXTROSE 2-4 GM/100ML-% IV SOLN: 2.0000 g | Freq: Once | INTRAVENOUS | Status: AC

## 2019-12-24 MED ORDER — HEPARIN SODIUM (PORCINE) 1000 UNIT/ML IJ SOLN
INTRAMUSCULAR | Status: DC | PRN
  Administered 2019-12-24: 4000 [IU] via INTRAVENOUS

## 2019-12-24 MED ORDER — HEPARIN (PORCINE) 25000 UT/250ML-% IV SOLN
1500.0000 [IU]/h | INTRAVENOUS | Status: DC
  Administered 2019-12-24 – 2019-12-25 (×2): 1250 [IU]/h via INTRAVENOUS
  Filled 2019-12-24: qty 250

## 2019-12-24 MED ORDER — DIPHENHYDRAMINE HCL 50 MG/ML IJ SOLN: 50.0000 mg | Freq: Once | INTRAMUSCULAR | Status: DC | PRN

## 2019-12-24 MED ORDER — FENTANYL CITRATE (PF) 100 MCG/2ML IJ SOLN
INTRAMUSCULAR | Status: DC | PRN
Start: 2019-12-24 — End: 2019-12-24
  Administered 2019-12-24: 50 ug via INTRAVENOUS
  Administered 2019-12-24: 100 ug via INTRAVENOUS

## 2019-12-24 MED ORDER — IPRATROPIUM-ALBUTEROL 0.5-2.5 (3) MG/3ML IN SOLN
3.0000 mL | Freq: Three times a day (TID) | RESPIRATORY_TRACT | Status: DC
  Administered 2019-12-24 – 2019-12-27 (×9): 3 mL via RESPIRATORY_TRACT
  Filled 2019-12-24 (×10): qty 3

## 2019-12-24 MED ORDER — FENTANYL CITRATE (PF) 100 MCG/2ML IJ SOLN
INTRAMUSCULAR | Status: AC
  Filled 2019-12-24: qty 2

## 2019-12-24 MED ORDER — FAMOTIDINE 20 MG PO TABS: 40.0000 mg | ORAL_TABLET | Freq: Once | ORAL | Status: DC | PRN

## 2019-12-24 MED ORDER — FENTANYL CITRATE (PF) 100 MCG/2ML IJ SOLN
INTRAMUSCULAR | Status: AC
Start: 2019-12-24 — End: 2019-12-25
  Filled 2019-12-24: qty 2

## 2019-12-24 MED ORDER — ALTEPLASE 2 MG IJ SOLR
INTRAMUSCULAR | Status: DC | PRN
  Administered 2019-12-24: 8 mg

## 2019-12-24 MED ORDER — MORPHINE SULFATE (PF) 2 MG/ML IV SOLN
2.0000 mg | INTRAVENOUS | Status: DC | PRN
  Administered 2019-12-24 – 2019-12-26 (×2): 2 mg via INTRAVENOUS
  Filled 2019-12-24 (×2): qty 1

## 2019-12-24 MED ORDER — HEPARIN BOLUS VIA INFUSION
2000.0000 [IU] | Freq: Once | INTRAVENOUS | Status: AC
  Administered 2019-12-24: 2000 [IU] via INTRAVENOUS
  Filled 2019-12-24: qty 2000

## 2019-12-24 MED ORDER — SODIUM CHLORIDE 0.9 % IV SOLN: INTRAVENOUS | Status: DC

## 2019-12-24 MED ORDER — METHYLPREDNISOLONE SODIUM SUCC 125 MG IJ SOLR: 125.0000 mg | Freq: Once | INTRAMUSCULAR | Status: DC | PRN

## 2019-12-24 MED ORDER — HEPARIN SODIUM (PORCINE) 1000 UNIT/ML IJ SOLN
INTRAMUSCULAR | Status: AC
  Filled 2019-12-24: qty 1

## 2019-12-24 MED ORDER — MIDAZOLAM HCL 2 MG/2ML IJ SOLN
INTRAMUSCULAR | Status: DC | PRN
  Administered 2019-12-24: 1 mg via INTRAVENOUS
  Administered 2019-12-24: 2 mg via INTRAVENOUS

## 2019-12-24 MED ORDER — ONDANSETRON HCL 4 MG/2ML IJ SOLN: 4.0000 mg | Freq: Four times a day (QID) | INTRAMUSCULAR | Status: DC | PRN

## 2019-12-24 MED ORDER — CEFAZOLIN SODIUM-DEXTROSE 2-4 GM/100ML-% IV SOLN
INTRAVENOUS | Status: AC
  Administered 2019-12-24: 2 g via INTRAVENOUS
  Filled 2019-12-24: qty 100

## 2019-12-24 MED ORDER — HYDROMORPHONE HCL 1 MG/ML IJ SOLN: 1.0000 mg | Freq: Once | INTRAMUSCULAR | Status: DC | PRN

## 2019-12-24 MED ORDER — MIDAZOLAM HCL 5 MG/5ML IJ SOLN
INTRAMUSCULAR | Status: AC
  Filled 2019-12-24: qty 5

## 2019-12-24 MED ORDER — POTASSIUM CHLORIDE CRYS ER 20 MEQ PO TBCR
40.0000 meq | EXTENDED_RELEASE_TABLET | ORAL | Status: AC
  Administered 2019-12-24: 40 meq via ORAL
  Filled 2019-12-24: qty 2

## 2019-12-24 MED ORDER — MIDAZOLAM HCL 2 MG/ML PO SYRP: 8.0000 mg | ORAL_SOLUTION | Freq: Once | ORAL | Status: DC | PRN

## 2019-12-24 SURGICAL SUPPLY — 23 items
CANISTER PENUMBRA ENGINE (MISCELLANEOUS) ×1 IMPLANT
CATH ANGIO 5F PIGTAIL 100CM (CATHETERS) ×1 IMPLANT
CATH BEACON 5 .035 100 JB2 TIP (CATHETERS) ×1 IMPLANT
CATH BEACON 5 .035 100 SIM1 TP (CATHETERS) ×1 IMPLANT
CATH G 5FX100 (CATHETERS) ×1 IMPLANT
CATH INFINITI 5FR ANG PIGTAIL (CATHETERS) ×1 IMPLANT
CATH INFINITI JR4 5F (CATHETERS) ×2 IMPLANT
CATH LIGHTNING 8 XTORQ 115 (CATHETERS) ×1 IMPLANT
DEVICE TORQUE .025-.038 (MISCELLANEOUS) ×1 IMPLANT
GLIDEWIRE ADV .035X180CM (WIRE) ×1 IMPLANT
GLIDEWIRE ANGLED SS 035X260CM (WIRE) ×1 IMPLANT
GUIDEWIRE ANGLED .035X260CM (WIRE) ×1 IMPLANT
NDL ENTRY 21GA 7CM ECHOTIP (NEEDLE) IMPLANT
NEEDLE ENTRY 21GA 7CM ECHOTIP (NEEDLE) ×2 IMPLANT
PACK ANGIOGRAPHY (CUSTOM PROCEDURE TRAY) ×2 IMPLANT
SET INTRO CAPELLA COAXIAL (SET/KITS/TRAYS/PACK) ×1 IMPLANT
SHEATH BRITE TIP 6FRX11 (SHEATH) ×1 IMPLANT
SHEATH BRITE TIP 8FRX11 (SHEATH) ×1 IMPLANT
SHEATH DESTINIATION 65 8FR (SHEATH) ×1 IMPLANT
SYR MEDRAD MARK 7 150ML (SYRINGE) ×1 IMPLANT
TUBING CONTRAST HIGH PRESS 72 (TUBING) ×2 IMPLANT
WIRE AMPLATZ SSTIFF .035X260CM (WIRE) ×1 IMPLANT
WIRE J 3MM .035X145CM (WIRE) ×1 IMPLANT

## 2019-12-24 NOTE — Interval H&P Note (Signed)
History and Physical Interval Note:  12/24/2019 1:26 PM  Brady Smith  has presented today for surgery, with the diagnosis of Pulmonary Embolism.  The various methods of treatment have been discussed with the patient and family. After consideration of risks, benefits and other options for treatment, the patient has consented to  Procedure(s): PULMONARY THROMBECTOMY / THROMBOLYSIS (N/A) as a surgical intervention.  The patient's history has been reviewed, patient examined, no change in status, stable for surgery.  I have reviewed the patient's chart and labs.  Questions were answered to the patient's satisfaction.     Hortencia Pilar

## 2019-12-24 NOTE — Progress Notes (Signed)
*  PRELIMINARY RESULTS* Echocardiogram 2D Echocardiogram has been performed.  Brady Smith 12/24/2019, 9:26 AM

## 2019-12-24 NOTE — Progress Notes (Signed)
Pt returns from procedural area. PAD still intact to right femerol. Very small amount of oozing noted at site, otherwise dressing is clean, dry and intact. Pt instructed to lay flat until intructed time of 6 pm. VSS, I will continue to assess.

## 2019-12-24 NOTE — Consult Note (Signed)
[facility] for Heparin  Indication: pulmonary embolus  Allergies  Allergen Reactions  . Influenza Vaccines     High blood pressure- had to be admitted  . Lipitor [Atorvastatin Calcium]     Muscle pain    Patient Measurements: Height: 5\' 7"  (170.2 cm) Weight: 68 kg (150 lb) IBW/kg (Calculated) : 66.1 Heparin Dosing Weight: 68 kg   Vital Signs: BP: 137/75 (10/01 0555) Pulse Rate: 74 (10/01 0555)  Labs: Recent Labs    12/23/19 0815 12/23/19 2054 12/24/19 0540  HGB 14.2  --  11.4*  HCT 44.5  --  36.4*  PLT 194  --  170  APTT 27  --   --   LABPROT 12.5  --   --   INR 1.0  --   --   HEPARINUNFRC  --  0.37 0.18*  CREATININE 1.25*  --  0.97  TROPONINIHS 27*  --   --     Estimated Creatinine Clearance: 62.5 mL/min (by C-G formula based on SCr of 0.97 mg/dL).   Medications:  Confirmed w/patient no PTA anticoagulants   Assessment: Pharmacy has been consulted for heparin dosing a patient with a PE. Baseline labs are appropriate to start heparin.   Baseline aPTT 27s, baseline INR 1 9/30 2054 HL 0.37, therapeutic x 1 10/1 0540 HL 0.18, bolus 2000 units and increase rate to 1250 units/hr  Goal of Therapy:  Heparin level 0.3-0.7 units/ml Monitor platelets by anticoagulation protocol: Yes   Plan:  --10/1 at 0540 HL = 0.18 is subtherapeutic. Will bolus with 2000 units and increase heparin infusion to 1250 units/hr --Re-check Heparin level in 8 hours --Daily CBC per protocol  Paulina Fusi, PharmD, BCPS 12/24/2019 6:52 AM

## 2019-12-24 NOTE — Consult Note (Signed)
Pharmacy Antibiotic Note  Brady Smith is a 74 y.o. male admitted on 12/23/2019 with aspiration pneumonia. PMH significant for COPD, GERD, PVD, depression, and nicotine dependence. Patient came to ED c/o SOB initially with exertion but now is SOB at rest. Patient was hospitalized for ~1 week for CAP (9/11-9/18). 9/30 CXR most likely representing PNA in R lung; L lung clear. CT angiogram positive for PE and consolidation concerning for infection aspiration. Pharmacy has been consulted for Unasyn dosing.  Day 2 IV antibiotics; Scr 1.25>0.97; WBC 12.8>7.1>10; PCT <0.10  Plan: Continue Unasyn 3g IV q6h Monitor renal function and adjust dose as clinically indicated  Height: 5\' 7"  (170.2 cm) Weight: 74.8 kg (165 lb) IBW/kg (Calculated) : 66.1  Temp (24hrs), Avg:97.9 F (36.6 C), Min:97.5 F (36.4 C), Max:98.4 F (36.9 C)  Recent Labs  Lab 12/18/19 1014 12/23/19 0815 12/23/19 0834 12/24/19 0540  WBC 12.8* 7.1  --  10.0  CREATININE 0.96 1.25*  --  0.97  LATICACIDVEN  --   --  2.0*  --     Estimated Creatinine Clearance: 62.5 mL/min (by C-G formula based on SCr of 0.97 mg/dL).    Allergies  Allergen Reactions  . Influenza Vaccines     High blood pressure- had to be admitted  . Lipitor [Atorvastatin Calcium]     Muscle pain    Antimicrobials this admission: 9/30 Unasyn >>   Microbiology results: 9/30 BCx: pending   Thank you for allowing pharmacy to be a part of this patient's care.  Sherilyn Banker, PharmD Pharmacy Resident  12/24/2019 12:44 PM

## 2019-12-24 NOTE — ED Notes (Signed)
Pt changed after urinating in bed. Pt able to assist in rolling from side to side. New gown applied.

## 2019-12-24 NOTE — Progress Notes (Signed)
Progress Note    Brady Smith  OIN:[id number] DOB: Apr 11, 1945  DOA: 12/23/2019 PCP: Glean Hess, MD      Brief Narrative:    Medical records reviewed and are as summarized below:  Brady Smith is a 74 y.o. male       Assessment/Plan:   Principal Problem:   Pulmonary embolism ([place]) Active Problems:   Essential hypertension   Nicotine addiction   Paroxysmal atrial fibrillation ([place])   Tobacco abuse   COPD with acute exacerbation ([place])   Acute respiratory failure with hypoxia ([place])  Right lower extremity DVT PVD  Body mass index is 25.84 kg/m.     PLAN  Continue IV heparin for acute bilateral PE and right lower extremity DVT.  Discussed long-term anticoagulation with the patient and control at the bedside.  Risks and benefits were discussed and patient is agreeable to long-term anticoagulation. Plan for pulmonary thrombolysis/thrombectomy today.  Follow-up with vascular surgeon.  2D echo showed EF estimated at 65%, indeterminate LV diastolic parameters, mild to moderate MR, mild to moderate TR, moderately elevated pulmonary artery systolic pressure.  He was on 5 L/min oxygen via nasal cannula when I saw him this morning.  Continue oxygen therapy and taper off as able.  Continue IV Unasyn for probable aspiration pneumonia.  Continue bronchodilators for COPD.   Diet Order            Diet NPO time specified  Diet effective now                    Consultants:  Vascular surgeon  Procedures:  Plan for thrombolysis/thrombectomy today    Medications:   . [MAR Hold] aspirin EC  81 mg Oral QODAY  . [MAR Hold] clonazePAM  0.25 mg Oral TID  . [MAR Hold] docusate sodium  100 mg Oral BID  . fentaNYL      . fentaNYL      . [MAR Hold] gabapentin  100 mg Oral QHS  . heparin sodium (porcine)      . [MAR Hold] ipratropium-albuterol  3 mL Nebulization TID  . midazolam      . [MAR Hold] mometasone-formoterol  2 puff Inhalation BID  . [MAR  Hold] multivitamin with minerals  1 tablet Oral Daily  . [MAR Hold] nicotine  14 mg Transdermal Daily  . [MAR Hold] OLANZapine  1.25 mg Oral QHS  . [MAR Hold] potassium chloride  40 mEq Oral Q4H  . [MAR Hold] sucralfate  1 g Oral TID WC & HS  . [MAR Hold] triamcinolone ointment  1 application Topical BID   Continuous Infusions: . sodium chloride 100 mL/hr at 12/24/19 0323  . sodium chloride    . [MAR Hold] ampicillin-sulbactam (UNASYN) IV Stopped (12/24/19 7096)  . heparin 1,250 Units/hr (12/24/19 0657)     Anti-infectives (From admission, onward)   Start     Dose/Rate Route Frequency Ordered Stop   12/24/19 1145  ceFAZolin (ANCEF) IVPB 2g/100 mL premix       Note to Pharmacy: To be given in specials   2 g 200 mL/hr over 30 Minutes Intravenous  Once 12/24/19 1136 12/24/19 1400   12/23/19 1200  [MAR Hold]  Ampicillin-Sulbactam (UNASYN) 3 g in sodium chloride 0.9 % 100 mL IVPB        (MAR Hold since Fri 12/24/2019 at 1156.Hold Reason: Transfer to a Procedural area.)   3 g 200 mL/hr over 30 Minutes Intravenous Every 6 hours 12/23/19 1116  Family Communication/Anticipated D/C date and plan/Code Status   DVT prophylaxis:      Code Status: Full Code  Family Communication: Plan discussed with Brady Smith, family member Disposition Plan:    Status is: Inpatient  Remains inpatient appropriate because:IV treatments appropriate due to intensity of illness or inability to take PO and Inpatient level of care appropriate due to severity of illness   Dispo: The patient is from: Home              Anticipated d/c is to: Home              Anticipated d/c date is: 2 days              Patient currently is not medically stable to d/c.           Subjective:   He complains of shortness of breath but is better compared to yesterday.  No chest pain or lower extremity pain.  His family member, Brady Smith was at the bedside.  Objective:    Vitals:   12/24/19 1400  12/24/19 1405 12/24/19 1410 12/24/19 1415  BP: 139/71 132/71 120/76 122/76  Pulse:      Resp: (!) 8 15 (!) 9 16  Temp:      TempSrc:      SpO2: 97% 96% 97% 99%  Weight:      Height:       No data found.   Intake/Output Summary (Last 24 hours) at 12/24/2019 1421 Last data filed at 12/24/2019 1100 Gross per 24 hour  Intake 770.15 ml  Output 0 ml  Net 770.15 ml   Filed Weights   12/23/19 0810 12/24/19 0652 12/24/19 1157  Weight: 68 kg 70 kg 74.8 kg    Exam:  GEN: NAD SKIN: No rash EYES: EOMI ENT: MMM CV: RRR PULM: CTA B ABD: soft, obese, abdominal hernia above the umbilical area, NT, +BS CNS: AAO x 3, non focal EXT: No edema or tenderness   Data Reviewed:   I have personally reviewed following labs and imaging studies:  Labs: Labs show the following:   Basic Metabolic Panel: Recent Labs  Lab 12/18/19 1014 12/18/19 1014 12/23/19 0815 12/24/19 0540  NA 138  --  142 141  K 3.5   < > 3.5 3.6  CL 97*  --  104 110  CO2 29  --  24 22  GLUCOSE 139*  --  118* 137*  BUN 14  --  18 16  CREATININE 0.96  --  1.25* 0.97  CALCIUM 8.6*  --  8.6* 8.1*   < > = values in this interval not displayed.   GFR Estimated Creatinine Clearance: 62.5 mL/min (by C-G formula based on SCr of 0.97 mg/dL). Liver Function Tests: Recent Labs  Lab 12/23/19 0815  AST 31  ALT 31  ALKPHOS 73  BILITOT 1.1  PROT 7.3  ALBUMIN 3.4*   No results for input(s): LIPASE, AMYLASE in the last 168 hours. No results for input(s): AMMONIA in the last 168 hours. Coagulation profile Recent Labs  Lab 12/23/19 0815  INR 1.0    CBC: Recent Labs  Lab 12/18/19 1014 12/23/19 0815 12/24/19 0540  WBC 12.8* 7.1 10.0  NEUTROABS  --  5.6  --   HGB 13.9 14.2 11.4*  HCT 42.4 44.5 36.4*  MCV 95.1 96.3 97.3  PLT 204 194 170   Cardiac Enzymes: No results for input(s): CKTOTAL, CKMB, CKMBINDEX, TROPONINI in the last 168 hours. BNP (last 3 results) No  results for input(s): PROBNP in the last  8760 hours. CBG: No results for input(s): GLUCAP in the last 168 hours. D-Dimer: No results for input(s): DDIMER in the last 72 hours. Hgb A1c: No results for input(s): HGBA1C in the last 72 hours. Lipid Profile: No results for input(s): CHOL, HDL, LDLCALC, TRIG, CHOLHDL, LDLDIRECT in the last 72 hours. Thyroid function studies: No results for input(s): TSH, T4TOTAL, T3FREE, THYROIDAB in the last 72 hours.  Invalid input(s): FREET3 Anemia work up: No results for input(s): VITAMINB12, FOLATE, FERRITIN, TIBC, IRON, RETICCTPCT in the last 72 hours. Sepsis Labs: Recent Labs  Lab 12/18/19 1014 12/23/19 0815 12/23/19 0834 12/24/19 0540  PROCALCITON  --  <0.10  --   --   WBC 12.8* 7.1  --  10.0  LATICACIDVEN  --   --  2.0*  --     Microbiology Recent Results (from the past 240 hour(s))  Blood culture (routine single)     Status: None (Preliminary result)   Collection Time: 12/23/19  8:12 AM   Specimen: BLOOD LEFT WRIST  Result Value Ref Range Status   Specimen Description BLOOD LEFT WRIST  Final   Special Requests   Final    BOTTLES DRAWN AEROBIC AND ANAEROBIC Blood Culture adequate volume   Culture   Final    NO GROWTH < 24 HOURS Performed at [facility], [address], [place], [place] [address]    Report Status PENDING  Incomplete  Respiratory Panel by RT PCR (Flu A&B, Covid) - Nasopharyngeal Swab     Status: None   Collection Time: 12/23/19  8:45 AM   Specimen: Nasopharyngeal Swab  Result Value Ref Range Status   SARS Coronavirus 2 by RT PCR NEGATIVE NEGATIVE Final    Comment: (NOTE) SARS-CoV-2 target nucleic acids are NOT DETECTED.  The SARS-CoV-2 RNA is generally detectable in upper respiratoy specimens during the acute phase of infection. The lowest concentration of SARS-CoV-2 viral copies this assay can detect is 131 copies/mL. A negative result does not preclude SARS-Cov-2 infection and should not be used as the sole basis for treatment or other  patient management decisions. A negative result may occur with  improper specimen collection/handling, submission of specimen other than nasopharyngeal swab, presence of viral mutation(s) within the areas targeted by this assay, and inadequate number of viral copies (<131 copies/mL). A negative result must be combined with clinical observations, patient history, and epidemiological information. The expected result is Negative.  Fact Sheet for Patients:  PinkCheek.be  Fact Sheet for Healthcare Providers:  GravelBags.it  This test is no t yet approved or cleared by the [place] FDA and  has been authorized for detection and/or diagnosis of SARS-CoV-2 by FDA under an Emergency Use Authorization (EUA). This EUA will remain  in effect (meaning this test can be used) for the duration of the COVID-19 declaration under Section 564(b)(1) of the Act, 21 U.S.C. section 360bbb-3(b)(1), unless the authorization is terminated or revoked sooner.     Influenza A by PCR NEGATIVE NEGATIVE Final   Influenza B by PCR NEGATIVE NEGATIVE Final    Comment: (NOTE) The Xpert Xpress SARS-CoV-2/FLU/RSV assay is intended as an aid in  the diagnosis of influenza from Nasopharyngeal swab specimens and  should not be used as a sole basis for treatment. Nasal washings and  aspirates are unacceptable for Xpert Xpress SARS-CoV-2/FLU/RSV  testing.  Fact Sheet for Patients: PinkCheek.be  Fact Sheet for Healthcare Providers: GravelBags.it  This test is not yet approved or  cleared by the [place] and  has been authorized for detection and/or diagnosis of SARS-CoV-2 by  FDA under an Emergency Use Authorization (EUA). This EUA will remain  in effect (meaning this test can be used) for the duration of the  Covid-19 declaration under Section 564(b)(1) of the Act, 21  U.S.C. section  360bbb-3(b)(1), unless the authorization is  terminated or revoked. Performed at [facility], [address] [place], [place] [address]     Procedures and diagnostic studies:  CT Angio Chest PE W and/or Wo Contrast  Result Date: 12/23/2019 CLINICAL DATA:  Chest pain, shortness of breath EXAM: CT ANGIOGRAPHY CHEST WITH CONTRAST TECHNIQUE: Multidetector CT imaging of the chest was performed using the standard protocol during bolus administration of intravenous contrast. Multiplanar CT image reconstructions and MIPs were obtained to evaluate the vascular anatomy. CONTRAST:  69mL OMNIPAQUE IOHEXOL 350 MG/ML SOLN COMPARISON:  None. FINDINGS: Cardiovascular: Satisfactory opacification of the pulmonary arteries to the segmental level. Positive examination for pulmonary embolism with a large burden of embolus present in the distal left pulmonary artery and lobar through segmental branches. There is a smaller burden of lobar to segmental embolus in the right lung. Normal heart size. The RV LV ratio is enlarged, approximately 1.5-1. No pericardial effusion. Aortic atherosclerosis. Three-vessel coronary artery calcifications. Mediastinum/Nodes: No enlarged mediastinal, hilar, or axillary lymph nodes. Frothy debris in the lower trachea. Thyroid gland and esophagus demonstrate no significant findings. Lungs/Pleura: Severe centrilobular emphysema. Diffuse bilateral bronchial wall thickening. There is extensive heterogeneous airspace opacity predominantly in the dependent right lower lobe with associated atelectasis or consolidation and a small right pleural effusion. There is calcification of the visceral pleura of the right lower lobe. Upper Abdomen: No acute abnormality. Musculoskeletal: No chest wall abnormality. No acute or significant osseous findings. Review of the MIP images confirms the above findings. IMPRESSION: 1. Positive examination for pulmonary embolism with a large burden of embolus present  in the distal left pulmonary artery and lobar through segmental branches. There is a smaller burden of lobar to segmental embolus in the right lung. 2. The RV LV ratio is enlarged, approximately 1.5-1, concerning for right heart strain. 3. There is extensive heterogeneous airspace opacity predominantly in the dependent right lower lobe with associated atelectasis or consolidation, concerning for infection or aspiration. This is in general not distal to occlusive embolus and does not likely reflect pulmonary infarction. 4. Small right pleural effusion with calcification of the visceral pleura of the right lower lobe, likely chronic and loculated. 5. Severe centrilobular emphysema. Emphysema (ICD10-J43.9). 6. Coronary artery disease.  Aortic Atherosclerosis (ICD10-I70.0). These results were called by telephone at the time of interpretation on 12/23/2019 at 10:31 am to Dr. Delman Kitten , who verbally acknowledged these results. Electronically Signed   By: Eddie Candle M.D.   On: 12/23/2019 10:36   US Venous Img Lower Bilateral (DVT)  Result Date: 12/23/2019 CLINICAL DATA:  Bilateral leg pain for several years EXAM: BILATERAL LOWER EXTREMITY VENOUS DOPPLER ULTRASOUND TECHNIQUE: Gray-scale sonography with graded compression, as well as color Doppler and duplex ultrasound were performed to evaluate the lower extremity deep venous systems from the level of the common femoral vein and including the common femoral, femoral, profunda femoral, popliteal and calf veins including the posterior tibial, peroneal and gastrocnemius veins when visible. The superficial great saphenous vein was also interrogated. Spectral Doppler was utilized to evaluate flow at rest and with distal augmentation maneuvers in the common femoral, femoral and popliteal veins. COMPARISON:  None. FINDINGS: RIGHT LOWER EXTREMITY Common Femoral Vein: No evidence of thrombus. Normal compressibility, respiratory phasicity and response to augmentation.  Saphenofemoral Junction: No evidence of thrombus. Normal compressibility and flow on color Doppler imaging. Profunda Femoral Vein: No evidence of thrombus. Normal compressibility and flow on color Doppler imaging. Femoral Vein: No evidence of thrombus. Normal compressibility, respiratory phasicity and response to augmentation. Popliteal Vein: Thrombus is noted with decreased compressibility in the popliteal vein. Calf Veins: Anterior tibial and peroneal veins also demonstrate thrombus with decreased compressibility. Superficial Great Saphenous Vein: No evidence of thrombus. Normal compressibility. Venous Reflux:  None. Other Findings:  None. LEFT LOWER EXTREMITY Common Femoral Vein: No evidence of thrombus. Normal compressibility, respiratory phasicity and response to augmentation. Saphenofemoral Junction: No evidence of thrombus. Normal compressibility and flow on color Doppler imaging. Profunda Femoral Vein: No evidence of thrombus. Normal compressibility and flow on color Doppler imaging. Femoral Vein: No evidence of thrombus. Normal compressibility, respiratory phasicity and response to augmentation. Popliteal Vein: No evidence of thrombus. Normal compressibility, respiratory phasicity and response to augmentation. Calf Veins: No evidence of thrombus. Normal compressibility and flow on color Doppler imaging. Superficial Great Saphenous Vein: No evidence of thrombus. Normal compressibility. Venous Reflux:  None. Other Findings:  None. IMPRESSION: Deep venous thrombosis within the right popliteal, peroneal and anterior tibial veins. Electronically Signed   By: Inez Catalina M.D.   On: 12/23/2019 13:51   DG Chest Port 1 View  Result Date: 12/23/2019 CLINICAL DATA:  Decreased oxygen saturation EXAM: PORTABLE CHEST 1 VIEW COMPARISON:  December 18, 2019 FINDINGS: There is persistent airspace opacity in the right mid and lower lung regions. There is a minimal right pleural effusion. The left lung is clear. Heart  size and pulmonary vascularity are normal. No adenopathy. No bone lesions. IMPRESSION: Airspace opacity, most likely representing pneumonia in the mid and lower lung regions on the right with slightly more consolidation compared to most recent study. Rather minimal pleural effusion on the right noted. Left lung clear. Cardiac silhouette normal. Electronically Signed   By: Lowella Grip III M.D.   On: 12/23/2019 08:48   ECHOCARDIOGRAM COMPLETE  Result Date: 12/24/2019    ECHOCARDIOGRAM REPORT   Patient Name:   Brady Smith Date of Exam: 12/24/2019 Medical Rec #:  [id number]     Height:       67.0 in Accession #:    [id number]    Weight:       154.3 lb Date of Birth:  1945/04/29     BSA:          1.811 m Patient Age:    55 years      BP:           133/74 mmHg Patient Gender: M             HR:           70 bpm. Exam Location:  [facility] Procedure: 2D Echo, Cardiac Doppler and Color Doppler Indications:     CHF-acute diastolic 017.49  History:         Patient has prior history of Echocardiogram examinations, most                  recent 05/23/2015. COPD. Pneumonia, PVD.  Sonographer:     Sherrie Sport RDCS (AE) Referring Phys:  [id number] Collier Bullock Diagnosing Phys: Ida Rogue MD  Sonographer Comments: Suboptimal apical window. Image acquisition challenging due to COPD. IMPRESSIONS  1. Left ventricular ejection fraction, by estimation,  is 60 to 65%. The left ventricle has normal function. The left ventricle has no regional wall motion abnormalities. Left ventricular diastolic parameters are indeterminate.  2. Right ventricular systolic function is mildly reduced. The right ventricular size is moderately enlarged. There is moderately elevated pulmonary artery systolic pressure. The estimated right ventricular systolic pressure is 56.3 mmHg.  3. Right atrial size was mildly dilated.  4. Tricuspid valve regurgitation is mild to moderate.  5. Mild to moderate mitral valve regurgitation.  6. Rhythm is atrial fibrillation  FINDINGS  Left Ventricle: Left ventricular ejection fraction, by estimation, is 60 to 65%. The left ventricle has normal function. The left ventricle has no regional wall motion abnormalities. The left ventricular internal cavity size was normal in size. There is  no left ventricular hypertrophy. Left ventricular diastolic parameters are indeterminate. Right Ventricle: The right ventricular size is moderately enlarged. No increase in right ventricular wall thickness. Right ventricular systolic function is mildly reduced. There is moderately elevated pulmonary artery systolic pressure. The tricuspid regurgitant velocity is 3.22 m/s, and with an assumed right atrial pressure of 5 mmHg, the estimated right ventricular systolic pressure is 87.5 mmHg. Left Atrium: Left atrial size was normal in size. Right Atrium: Right atrial size was mildly dilated. Pericardium: There is no evidence of pericardial effusion. Mitral Valve: The mitral valve is normal in structure. Mild to moderate mitral valve regurgitation. No evidence of mitral valve stenosis. Tricuspid Valve: The tricuspid valve is normal in structure. Tricuspid valve regurgitation is mild to moderate. No evidence of tricuspid stenosis. Aortic Valve: The aortic valve is normal in structure. Aortic valve regurgitation is not visualized. No aortic stenosis is present. Aortic valve mean gradient measures 2.0 mmHg. Aortic valve peak gradient measures 4.5 mmHg. Aortic valve area, by VTI measures 2.81 cm. Pulmonic Valve: The pulmonic valve was normal in structure. Pulmonic valve regurgitation is not visualized. No evidence of pulmonic stenosis. Aorta: The aortic root is normal in size and structure. Venous: The inferior vena cava is normal in size with greater than 50% respiratory variability, suggesting right atrial pressure of 3 mmHg. IAS/Shunts: No atrial level shunt detected by color flow Doppler.  LEFT VENTRICLE PLAX 2D LVIDd:         3.87 cm LVIDs:         2.60 cm LV  PW:         1.36 cm LV IVS:        0.93 cm LVOT diam:     2.00 cm LV SV:         48 LV SV Index:   27 LVOT Area:     3.14 cm  RIGHT VENTRICLE RV S prime:     17.30 cm/s TAPSE (M-mode): 3.4 cm LEFT ATRIUM           Index       RIGHT ATRIUM           Index LA diam:      2.80 cm 1.55 cm/m  RA Area:     30.50 cm LA Vol (A4C): 90.3 ml 49.86 ml/m RA Volume:   121.00 ml 66.81 ml/m  AORTIC VALVE                   PULMONIC VALVE AV Area (Vmax):    2.50 cm    RVOT Peak grad: 3 mmHg AV Area (Vmean):   2.40 cm AV Area (VTI):     2.81 cm AV Vmax:  106.00 cm/s AV Vmean:          67.950 cm/s AV VTI:            0.171 m AV Peak Grad:      4.5 mmHg AV Mean Grad:      2.0 mmHg LVOT Vmax:         84.20 cm/s LVOT Vmean:        52.000 cm/s LVOT VTI:          0.153 m LVOT/AV VTI ratio: 0.89  AORTA Ao Root diam: 3.40 cm MITRAL VALVE               TRICUSPID VALVE MV Area (PHT): 3.72 cm    TR Peak grad:   41.5 mmHg MV Decel Time: 204 msec    TR Vmax:        322.00 cm/s MV E velocity: 99.00 cm/s                            SHUNTS                            Systemic VTI:  0.15 m                            Systemic Diam: 2.00 cm Ida Rogue MD Electronically signed by Ida Rogue MD Signature Date/Time: 12/24/2019/10:25:10 AM    Final                LOS: 1 day   Marygrace Sandoval  [facility]   Pager on [email or web address]. If 7PM-7AM, please contact night-coverage at [email or web address]     12/24/2019, 2:21 PM

## 2019-12-24 NOTE — Op Note (Signed)
[facility]  Percutaneous Study/Intervention Procedural Note   Date of Surgery: 12/24/2019,3:05 PM  Surgeon:Pape Parson, Dolores Lory   Pre-operative Diagnosis: Pulmonary emboli with right heart strain and hypoxia  Post-operative diagnosis:  Same  Procedure(s) Performed:  1.  Selective injection right subsegmental pulmonary arteries  2.  Selective injection left subsegmental pulmonary artery  3.  Infusion of TPA for thrombolysis left upper and lower lobe pulmonary arteries  4.  Mechanical thrombectomy pulmonary emboli using penumbra CAT 8 lightening device left upper and lower main and segmental pulmonary arteries    Anesthesia: Conscious sedation was administered under my direct supervision by the interventional radiology RN. IV Versed plus fentanyl were utilized. Continuous ECG, pulse oximetry and blood pressure was monitored throughout the entire procedure.  Conscious sedation was administered for a total of 69 minutes and 41 seconds.  Sheath: 8 [place] destination right common femoral vein  Contrast: 70 cc   Fluoroscopy Time: 23.2 minutes  Indications:  Patient presented to the hospital with hypoxia and shortness of breath. The patient is unable to get out of bed and transfer to a chair without increased dyspnea. The patient's O2 saturations off nasal cannula are in the 80%. CT angiogram demonstrated bilateral pulmonary emboli associated with significant right heart strain. Given the long-term sequela and the patient's symptomatic condition the risks and benefits for angiography with thrombectomy are reviewed all questions are answered, the patient agrees to proceed.  Procedure:  LAQUINN SHIPPY a 74 y.o. male who was identified and appropriate procedural time out was performed.  The patient was then placed supine on the table and prepped and draped in the usual sterile fashion.  Ultrasound was used to evaluate the right common femoral vein.  It was compressible  indicating it is patent .  A digital ultrasound image was acquired for the permanent record.  A micropuncture needle was used to access the right common femoral vein under direct ultrasound guidance.  A microwire was then advanced under fluoroscopic guidance followed by micro-sheath.  A 0.035 J wire was advanced without resistance and a 5Fr sheath was placed.    The J-wire pigtail catheter was advanced up to the right atrium where a bolus injection contrast was used to demonstrate the pulmonary artery outflow. Floppy Glidewire was then exchanged for the J-wire and the pigtail catheter was exchanged for an 45 angled pigtail. Using the combination of the floppy Glidewire and the angled pigtail catheter the pulmonary outflow track was negotiated.  With the catheter in the right side bolus injection contrast was utilized to demonstrate the right pulmonary artery vasculature.  Images were not adequate and the Glidewire was reintroduced over the JR4 and I then selected first the right lower lobe pulmonary artery than the right upper lobe pulmonary artery and lastly the right middle lobe pulmonary artery.  Selective injection was performed of each of these.  These images did not demonstrate any significant thrombus.  I therefore turned my attention to the left pulmonary artery.  The Glidewire was reintroduced and the JR4 was angled to the left and the wire catheter combination was negotiated into the left main pulmonary artery.  A total of 8 mg of TPA was then infused into the left main pulmonary artery as well as the left upper and lower lobe pulmonary arteries.  This was performed through the Junction catheter negotiating a Glidewire into these segmental branches and then infusing TPA in a serial fashion.  After an appropriate dwell time the Amplatz wire  reintroduced through the angled pigtail catheter and the JR4 catheter removed the sheath was removed and an 8 French 65 cm sheath was advanced and positioned with  its tip on the left side.  The Penumbra Cat 8 extra torque catheter was then advanced into the thrombus in the left lower lobe. After multiple passes the catheter was then repositioned into the left upper lobe.  Again, after multiple passes there was free flow of blood, during these passes into the lower and then upper lobe the Glidewire was used both as a separator but also to negotiate the catheter into the segmental branches of both the upper and lower lobes.  Hand-injection of contrast was then performed through the penumbra catheter demonstrating near total resolution of the thrombus.   After review these images the catheter and sheath were removed and pressure held. There were no immediate complications.  Findings:   Right pulmonary artery: The right lower, upper and middle lobe pulmonary arteries are each selected an injection of contrast performed.  No significant thrombus is identified and therefore attention is turned to the left which is consistent with the CT scan that demonstrated the majority of the clot on the left and relatively small amount on the right.  Left pulmonary: Initial imaging demonstrates thrombus throughout the left upper and lower lobes.  Significant thrombus is located in the distal main left pulmonary artery.  After selecting the segmental branches of both the upper and the lower lobes and performing mechanical thrombectomy in association with TPA infusion there is now near total resolution of all thrombus and full perfusion of the left lung.    Disposition: Patient was taken to the recovery room in stable condition having tolerated the procedure well.  Belenda Cruise Jaymen Fetch 12/24/2019,3:05 PM

## 2019-12-25 LAB — CBC WITH DIFFERENTIAL/PLATELET
Abs Immature Granulocytes: 0.05 10*3/uL (ref 0.00–0.07)
Basophils Absolute: 0 10*3/uL (ref 0.0–0.1)
Basophils Relative: 0 %
Eosinophils Absolute: 0.1 10*3/uL (ref 0.0–0.5)
Eosinophils Relative: 1 %
HCT: 38.6 % — ABNORMAL LOW (ref 39.0–52.0)
Hemoglobin: 12.1 g/dL — ABNORMAL LOW (ref 13.0–17.0)
Immature Granulocytes: 1 %
Lymphocytes Relative: 11 %
Lymphs Abs: 0.8 10*3/uL (ref 0.7–4.0)
MCH: 31 pg (ref 26.0–34.0)
MCHC: 31.3 g/dL (ref 30.0–36.0)
MCV: 99 fL (ref 80.0–100.0)
Monocytes Absolute: 0.5 10*3/uL (ref 0.1–1.0)
Monocytes Relative: 6 %
Neutro Abs: 6 10*3/uL (ref 1.7–7.7)
Neutrophils Relative %: 81 %
Platelets: 159 10*3/uL (ref 150–400)
RBC: 3.9 MIL/uL — ABNORMAL LOW (ref 4.22–5.81)
RDW: 14.1 % (ref 11.5–15.5)
WBC: 7.4 10*3/uL (ref 4.0–10.5)
nRBC: 0 % (ref 0.0–0.2)

## 2019-12-25 LAB — BASIC METABOLIC PANEL
Anion gap: 7 (ref 5–15)
BUN: 14 mg/dL (ref 8–23)
CO2: 25 mmol/L (ref 22–32)
Calcium: 8.4 mg/dL — ABNORMAL LOW (ref 8.9–10.3)
Chloride: 110 mmol/L (ref 98–111)
Creatinine, Ser: 0.96 mg/dL (ref 0.61–1.24)
GFR calc Af Amer: >60 mL/min (ref >60)
GFR calc non Af Amer: >60 mL/min (ref >60)
Glucose, Bld: 97 mg/dL (ref 70–99)
Potassium: 4.4 mmol/L (ref 3.5–5.1)
Sodium: 142 mmol/L (ref 135–145)

## 2019-12-25 LAB — MAGNESIUM: Magnesium: 2.2 mg/dL (ref 1.7–2.4)

## 2019-12-25 LAB — HEPARIN LEVEL (UNFRACTIONATED)
Heparin Unfractionated: <0.1 IU/mL — ABNORMAL LOW (ref 0.30–0.70)
Heparin Unfractionated: 0.16 IU/mL — ABNORMAL LOW (ref 0.30–0.70)

## 2019-12-25 MED ORDER — APIXABAN 5 MG PO TABS
10.0000 mg | ORAL_TABLET | Freq: Two times a day (BID) | ORAL | Status: DC
  Administered 2019-12-25 – 2019-12-27 (×4): 10 mg via ORAL
  Filled 2019-12-25 (×5): qty 2

## 2019-12-25 MED ORDER — HALOPERIDOL LACTATE 5 MG/ML IJ SOLN: 5.0000 mg | Freq: Four times a day (QID) | INTRAMUSCULAR | Status: DC | PRN

## 2019-12-25 MED ORDER — AMOXICILLIN-POT CLAVULANATE 875-125 MG PO TABS
1.0000 | ORAL_TABLET | Freq: Two times a day (BID) | ORAL | Status: DC
  Administered 2019-12-25 – 2019-12-27 (×4): 1 via ORAL
  Filled 2019-12-25 (×5): qty 1

## 2019-12-25 MED ORDER — HALOPERIDOL 5 MG PO TABS
5.0000 mg | ORAL_TABLET | Freq: Once | ORAL | Status: AC
  Administered 2019-12-25: 5 mg via ORAL
  Filled 2019-12-25: qty 1

## 2019-12-25 MED ORDER — APIXABAN 5 MG PO TABS: 5.0000 mg | ORAL_TABLET | Freq: Two times a day (BID) | ORAL | Status: DC

## 2019-12-25 MED ORDER — HEPARIN BOLUS VIA INFUSION
2100.0000 [IU] | Freq: Once | INTRAVENOUS | Status: AC
  Administered 2019-12-25: 2100 [IU] via INTRAVENOUS
  Filled 2019-12-25: qty 2100

## 2019-12-25 NOTE — Consult Note (Signed)
[facility] for Heparin  Indication: pulmonary embolus  Allergies  Allergen Reactions  . Influenza Vaccines     High blood pressure- had to be admitted  . Lipitor [Atorvastatin Calcium]     Muscle pain   Patient Measurements: Height: 5\' 7"  (170.2 cm) Weight: 74.8 kg (165 lb) IBW/kg (Calculated) : 66.1 Heparin Dosing Weight: 68 kg   Vital Signs: Temp: 97.7 F (36.5 C) (10/01 2001) Temp Source: Oral (10/01 2001) BP: 124/80 (10/01 2001) Pulse Rate: 85 (10/01 2001)  Labs: Recent Labs    12/23/19 0815 12/23/19 2054 12/24/19 0540 12/25/19 0152  HGB 14.2  --  11.4*  --   HCT 44.5  --  36.4*  --   PLT 194  --  170  --   APTT 27  --   --   --   LABPROT 12.5  --   --   --   INR 1.0  --   --   --   HEPARINUNFRC  --  0.37 0.18* <0.10*  CREATININE 1.25*  --  0.97  --   TROPONINIHS 27*  --   --   --    Estimated Creatinine Clearance: 62.5 mL/min (by C-G formula based on SCr of 0.97 mg/dL).  Medications:  Confirmed w/patient no PTA anticoagulants   Assessment: Pharmacy has been consulted for heparin dosing a patient with a PE. Baseline labs are appropriate to start heparin.   Baseline aPTT 27s, baseline INR 1 9/30 2054 HL 0.37, therapeutic x 1 10/1 0540 HL 0.18, bolus 2000 units and increase rate to 1250 units/hr 10/2 0152 HL < 0.10: d/w nurse, heparin has been off, line was occluded, plan to restart now  Goal of Therapy:  Heparin level 0.3-0.7 units/ml Monitor platelets by anticoagulation protocol: Yes   Plan:  --10/2 at 0152 HL is subtherapeutic, line has been occluded and not infusing per nurse. Heparin being restarted now at same rate (1250 units/hr) --Re-check Heparin level in 8 hours --Daily CBC per protocol  Hart Robinsons, PharmD Clinical Pharmacist  12/25/2019 3:17 AM

## 2019-12-25 NOTE — Progress Notes (Addendum)
Progress Note    Brady Smith  YYQ:[id number] DOB: [birth year]  DOA: 12/23/2019 PCP: Glean Hess, MD      Brief Narrative:    Medical records reviewed and are as summarized below:  Brady Smith is a 74 y.o. male male with medical history significant for nicotine dependence, COPD, depression, GERD, peripheral vascular disease, recent hospitalization for pneumonia from September 11 through December 11, 2019. He presented to the hospital for evaluation  of shortness of breath. He said he has been feeling short of breath for about 2 weeks prior to admission and is at progressively worsened. He had gone to the [facility] to eat breakfast when he suddenly had worsening shortness of breath and so EMS was called. Upon EMS arrival he had room air pulse oximetry of 88% but with conversation his pulse oximetry dropped to 78% and he was placed on 15 L of oxygen.    Work-up revealed acute bilateral pulmonary embolism with large clot burden and right lower extremity DVT. He was treated with IV heparin infusion. Vascular surgeon was consulted and he underwent thrombolysis and mechanical thrombectomy of left upper and lower lobe pulmonary arteries.   There was also findings on chest imaging concerning for aspiration pneumonia so he was treated with IV Unasyn and was subsequently switched to Augmentin.   Assessment/Plan:   Principal Problem:   Pulmonary embolism (HCC) Active Problems:   Essential hypertension   Nicotine addiction   Paroxysmal atrial fibrillation (HCC)   Tobacco abuse   COPD with acute exacerbation (HCC)   Acute respiratory failure with hypoxia (HCC)  Right lower extremity DVT PVD  Body mass index is 24.56 kg/m.     PLAN  S/p thrombolysis and mechanical thrombectomy of left upper and lower lobe pulmonary arteries on 12/24/2019 for acute bilateral PE.  Discontinue IV heparin infusion later tonight and start Eliquis.  2D echo showed EF estimated at 65%,  indeterminate LV diastolic parameters, mild to moderate MR, mild to moderate TR, moderately elevated pulmonary artery systolic pressure.  Oxygen has been tapered down to 4 L/min oxygen via nasal cannula. Continue to taper off oxygen as able.  Change IV Unasyn to oral Augmentin for suspected aspiration pneumonia..  Continue bronchodilators for COPD.  Analgesics as needed for acute on chronic back pain.    ADDENDUM:  I was notified by Peggye Fothergill, RN, around 6 PM today, that patient wanted to leave [place].  Patient was upset because he said he felt confined to his room and was not allowed to walk in his room freely.  He said the bed alarm would go off anytime he tried to get up and the nurses would rush into his room to check up on him.  He said he is able to walk without any problems even though his right leg bothers him.  Patient was educated on the importance of having the bed alarm as part of safety and fall precaution, and the importance of calling for assistance when he wanted to walk around.  I called his family member, Levada Dy, and asked her to speak to the patient to convince him to stay. I met the patient face-to-face and explained the risks of leaving the hospital [place].  Patient agreed to stay for treatment.  Diet Order            Diet regular Room service appropriate? Yes; Fluid consistency: Thin  Diet effective now  Consultants:  Vascular surgeon  Procedures:  s/p thrombolysis and thrombectomy of left upper and lower lobe pulmonary arteries on 12/24/2019.    Medications:   . amoxicillin-clavulanate  1 tablet Oral Q12H  . clonazePAM  0.25 mg Oral TID  . docusate sodium  100 mg Oral BID  . gabapentin  100 mg Oral QHS  . ipratropium-albuterol  3 mL Nebulization TID  . mometasone-formoterol  2 puff Inhalation BID  . multivitamin with minerals  1 tablet Oral Daily  . nicotine  14 mg Transdermal Daily  . OLANZapine   1.25 mg Oral QHS  . sucralfate  1 g Oral TID WC & HS  . triamcinolone ointment  1 application Topical BID   Continuous Infusions: . heparin 1,500 Units/hr (12/25/19 1215)     Anti-infectives (From admission, onward)   Start     Dose/Rate Route Frequency Ordered Stop   12/25/19 2200  amoxicillin-clavulanate (AUGMENTIN) 875-125 MG per tablet 1 tablet        1 tablet Oral Every 12 hours 12/25/19 1644     12/24/19 1145  ceFAZolin (ANCEF) IVPB 2g/100 mL premix       Note to Pharmacy: To be given in specials   2 g 200 mL/hr over 30 Minutes Intravenous  Once 12/24/19 1136 12/24/19 1400   12/23/19 1200  Ampicillin-Sulbactam (UNASYN) 3 g in sodium chloride 0.9 % 100 mL IVPB  Status:  Discontinued        3 g 200 mL/hr over 30 Minutes Intravenous Every 6 hours 12/23/19 1116 12/25/19 1644             Family Communication/Anticipated D/C date and plan/Code Status   DVT prophylaxis:      Code Status: Full Code  Family Communication: Plan discussed with Levada Dy, family member Disposition Plan:    Status is: Inpatient  Remains inpatient appropriate because:IV treatments appropriate due to intensity of illness or inability to take PO and Inpatient level of care appropriate due to severity of illness   Dispo: The patient is from: Home              Anticipated d/c is to: Home              Anticipated d/c date is: 2 days              Patient currently is not medically stable to d/c.           Subjective:   No chest pain or shortness of breath. He complains of low back pain which is chronic. He still requires oxygen via nasal cannula.  Objective:    Vitals:   12/25/19 0300 12/25/19 0823 12/25/19 1238 12/25/19 1642  BP:  132/90 132/80 120/74  Pulse:  99 97 100  Resp:  20 20 16   Temp:  98.9 F (37.2 C) 99.3 F (37.4 C) 98.5 F (36.9 C)  TempSrc:  Oral Oral Oral  SpO2:  94% 95% 93%  Weight: 71.1 kg     Height: 5' 7"  (1.702 m)      No data  found.   Intake/Output Summary (Last 24 hours) at 12/25/2019 1718 Last data filed at 12/25/2019 1100 Gross per 24 hour  Intake 2915.23 ml  Output 500 ml  Net 2415.23 ml   Filed Weights   12/24/19 0652 12/24/19 1157 12/25/19 0300  Weight: 70 kg 74.8 kg 71.1 kg    Exam:  GEN: NAD SKIN: No rash EYES: EOMI ENT: MMM CV: RRR PULM: CTA B ABD:  soft, abdominal hernia, NT, +BS CNS: AAO x 3, non focal EXT: No edema or tenderness    Data Reviewed:   I have personally reviewed following labs and imaging studies:  Labs: Labs show the following:   Basic Metabolic Panel: Recent Labs  Lab 12/23/19 0815 12/23/19 0815 12/24/19 0540 12/25/19 0603  NA 142  --  141 142  K 3.5   < > 3.6 4.4  CL 104  --  110 110  CO2 24  --  22 25  GLUCOSE 118*  --  137* 97  BUN 18  --  16 14  CREATININE 1.25*  --  0.97 0.96  CALCIUM 8.6*  --  8.1* 8.4*  MG  --   --   --  2.2   < > = values in this interval not displayed.   GFR Estimated Creatinine Clearance: 63.1 mL/min (by C-G formula based on SCr of 0.96 mg/dL). Liver Function Tests: Recent Labs  Lab 12/23/19 0815  AST 31  ALT 31  ALKPHOS 73  BILITOT 1.1  PROT 7.3  ALBUMIN 3.4*   No results for input(s): LIPASE, AMYLASE in the last 168 hours. No results for input(s): AMMONIA in the last 168 hours. Coagulation profile Recent Labs  Lab 12/23/19 0815  INR 1.0    CBC: Recent Labs  Lab 12/23/19 0815 12/24/19 0540 12/25/19 0603  WBC 7.1 10.0 7.4  NEUTROABS 5.6  --  6.0  HGB 14.2 11.4* 12.1*  HCT 44.5 36.4* 38.6*  MCV 96.3 97.3 99.0  PLT 194 170 159   Cardiac Enzymes: No results for input(s): CKTOTAL, CKMB, CKMBINDEX, TROPONINI in the last 168 hours. BNP (last 3 results) No results for input(s): PROBNP in the last 8760 hours. CBG: No results for input(s): GLUCAP in the last 168 hours. D-Dimer: No results for input(s): DDIMER in the last 72 hours. Hgb A1c: No results for input(s): HGBA1C in the last 72 hours. Lipid  Profile: No results for input(s): CHOL, HDL, LDLCALC, TRIG, CHOLHDL, LDLDIRECT in the last 72 hours. Thyroid function studies: No results for input(s): TSH, T4TOTAL, T3FREE, THYROIDAB in the last 72 hours.  Invalid input(s): FREET3 Anemia work up: No results for input(s): VITAMINB12, FOLATE, FERRITIN, TIBC, IRON, RETICCTPCT in the last 72 hours. Sepsis Labs: Recent Labs  Lab 12/23/19 0815 12/23/19 0834 12/24/19 0540 12/25/19 0603  PROCALCITON <0.10  --   --   --   WBC 7.1  --  10.0 7.4  LATICACIDVEN  --  2.0*  --   --     Microbiology Recent Results (from the past 240 hour(s))  Blood culture (routine single)     Status: None (Preliminary result)   Collection Time: 12/23/19  8:12 AM   Specimen: BLOOD LEFT WRIST  Result Value Ref Range Status   Specimen Description BLOOD LEFT WRIST  Final   Special Requests   Final    BOTTLES DRAWN AEROBIC AND ANAEROBIC Blood Culture adequate volume   Culture   Final    NO GROWTH 2 DAYS Performed at [facility], [address] [place], [place] [address]    Report Status PENDING  Incomplete  Respiratory Panel by RT PCR (Flu A&B, Covid) - Nasopharyngeal Swab     Status: None   Collection Time: 12/23/19  8:45 AM   Specimen: Nasopharyngeal Swab  Result Value Ref Range Status   SARS Coronavirus 2 by RT PCR NEGATIVE NEGATIVE Final    Comment: (NOTE) SARS-CoV-2 target nucleic acids are NOT DETECTED.  The SARS-CoV-2 RNA is generally detectable in upper respiratoy specimens during the acute phase of infection. The lowest concentration of SARS-CoV-2 viral copies this assay can detect is 131 copies/mL. A negative result does not preclude SARS-Cov-2 infection and should not be used as the sole basis for treatment or other patient management decisions. A negative result may occur with  improper specimen collection/handling, submission of specimen other than nasopharyngeal swab, presence of viral mutation(s) within the areas targeted by  this assay, and inadequate number of viral copies (<131 copies/mL). A negative result must be combined with clinical observations, patient history, and epidemiological information. The expected result is Negative.  Fact Sheet for Patients:  PinkCheek.be  Fact Sheet for Healthcare Providers:  GravelBags.it  This test is no t yet approved or cleared by the [place] FDA and  has been authorized for detection and/or diagnosis of SARS-CoV-2 by FDA under an Emergency Use Authorization (EUA). This EUA will remain  in effect (meaning this test can be used) for the duration of the COVID-19 declaration under Section 564(b)(1) of the Act, 21 U.S.C. section 360bbb-3(b)(1), unless the authorization is terminated or revoked sooner.     Influenza A by PCR NEGATIVE NEGATIVE Final   Influenza B by PCR NEGATIVE NEGATIVE Final    Comment: (NOTE) The Xpert Xpress SARS-CoV-2/FLU/RSV assay is intended as an aid in  the diagnosis of influenza from Nasopharyngeal swab specimens and  should not be used as a sole basis for treatment. Nasal washings and  aspirates are unacceptable for Xpert Xpress SARS-CoV-2/FLU/RSV  testing.  Fact Sheet for Patients: PinkCheek.be  Fact Sheet for Healthcare Providers: GravelBags.it  This test is not yet approved or cleared by the [place] FDA and  has been authorized for detection and/or diagnosis of SARS-CoV-2 by  FDA under an Emergency Use Authorization (EUA). This EUA will remain  in effect (meaning this test can be used) for the duration of the  Covid-19 declaration under Section 564(b)(1) of the Act, 21  U.S.C. section 360bbb-3(b)(1), unless the authorization is  terminated or revoked. Performed at [facility], [address] [place], [place] [address]     Procedures and diagnostic studies:  PERIPHERAL VASCULAR  CATHETERIZATION  Result Date: 12/24/2019 See op note  ECHOCARDIOGRAM COMPLETE  Result Date: 12/24/2019    ECHOCARDIOGRAM REPORT   Patient Name:   Brady Smith Date of Exam: 12/24/2019 Medical Rec #:  [id number]     Height:       67.0 in Accession #:    [id number]    Weight:       154.3 lb Date of Birth:  [birth year]     BSA:          1.811 m Patient Age:    65 years      BP:           133/74 mmHg Patient Gender: M             HR:           70 bpm. Exam Location:  [facility] Procedure: 2D Echo, Cardiac Doppler and Color Doppler Indications:     CHF-acute diastolic 947.09  History:         Patient has prior history of Echocardiogram examinations, most                  recent 05/23/2015. COPD. Pneumonia, PVD.  Sonographer:     Sherrie Sport RDCS (AE) Referring Phys:  [place] Diagnosing Phys: Christia Reading  Gollan MD  Sonographer Comments: Suboptimal apical window. Image acquisition challenging due to COPD. IMPRESSIONS  1. Left ventricular ejection fraction, by estimation, is 60 to 65%. The left ventricle has normal function. The left ventricle has no regional wall motion abnormalities. Left ventricular diastolic parameters are indeterminate.  2. Right ventricular systolic function is mildly reduced. The right ventricular size is moderately enlarged. There is moderately elevated pulmonary artery systolic pressure. The estimated right ventricular systolic pressure is 24.5 mmHg.  3. Right atrial size was mildly dilated.  4. Tricuspid valve regurgitation is mild to moderate.  5. Mild to moderate mitral valve regurgitation.  6. Rhythm is atrial fibrillation FINDINGS  Left Ventricle: Left ventricular ejection fraction, by estimation, is 60 to 65%. The left ventricle has normal function. The left ventricle has no regional wall motion abnormalities. The left ventricular internal cavity size was normal in size. There is  no left ventricular hypertrophy. Left ventricular diastolic parameters are indeterminate. Right Ventricle:  The right ventricular size is moderately enlarged. No increase in right ventricular wall thickness. Right ventricular systolic function is mildly reduced. There is moderately elevated pulmonary artery systolic pressure. The tricuspid regurgitant velocity is 3.22 m/s, and with an assumed right atrial pressure of 5 mmHg, the estimated right ventricular systolic pressure is 80.9 mmHg. Left Atrium: Left atrial size was normal in size. Right Atrium: Right atrial size was mildly dilated. Pericardium: There is no evidence of pericardial effusion. Mitral Valve: The mitral valve is normal in structure. Mild to moderate mitral valve regurgitation. No evidence of mitral valve stenosis. Tricuspid Valve: The tricuspid valve is normal in structure. Tricuspid valve regurgitation is mild to moderate. No evidence of tricuspid stenosis. Aortic Valve: The aortic valve is normal in structure. Aortic valve regurgitation is not visualized. No aortic stenosis is present. Aortic valve mean gradient measures 2.0 mmHg. Aortic valve peak gradient measures 4.5 mmHg. Aortic valve area, by VTI measures 2.81 cm. Pulmonic Valve: The pulmonic valve was normal in structure. Pulmonic valve regurgitation is not visualized. No evidence of pulmonic stenosis. Aorta: The aortic root is normal in size and structure. Venous: The inferior vena cava is normal in size with greater than 50% respiratory variability, suggesting right atrial pressure of 3 mmHg. IAS/Shunts: No atrial level shunt detected by color flow Doppler.  LEFT VENTRICLE PLAX 2D LVIDd:         3.87 cm LVIDs:         2.60 cm LV PW:         1.36 cm LV IVS:        0.93 cm LVOT diam:     2.00 cm LV SV:         48 LV SV Index:   27 LVOT Area:     3.14 cm  RIGHT VENTRICLE RV S prime:     17.30 cm/s TAPSE (M-mode): 3.4 cm LEFT ATRIUM           Index       RIGHT ATRIUM           Index LA diam:      2.80 cm 1.55 cm/m  RA Area:     30.50 cm LA Vol (A4C): 90.3 ml 49.86 ml/m RA Volume:   121.00 ml  66.81 ml/m  AORTIC VALVE                   PULMONIC VALVE AV Area (Vmax):    2.50 cm    RVOT Peak grad: 3 mmHg AV  Area (Vmean):   2.40 cm AV Area (VTI):     2.81 cm AV Vmax:           106.00 cm/s AV Vmean:          67.950 cm/s AV VTI:            0.171 m AV Peak Grad:      4.5 mmHg AV Mean Grad:      2.0 mmHg LVOT Vmax:         84.20 cm/s LVOT Vmean:        52.000 cm/s LVOT VTI:          0.153 m LVOT/AV VTI ratio: 0.89  AORTA Ao Root diam: 3.40 cm MITRAL VALVE               TRICUSPID VALVE MV Area (PHT): 3.72 cm    TR Peak grad:   41.5 mmHg MV Decel Time: 204 msec    TR Vmax:        322.00 cm/s MV E velocity: 99.00 cm/s                            SHUNTS                            Systemic VTI:  0.15 m                            Systemic Diam: 2.00 cm Ida Rogue MD Electronically signed by Ida Rogue MD Signature Date/Time: 12/24/2019/10:25:10 AM    Final                LOS: 2 days   Tailyn Hantz  [facility]   Pager on [email or web address]. If 7PM-7AM, please contact night-coverage at [email or web address]     12/25/2019, 5:18 PM

## 2019-12-25 NOTE — NC FL2 (Signed)
[place] LEVEL OF CARE SCREENING TOOL     IDENTIFICATION  Patient Name: Brady Smith Birthdate: 08-Sep-1945 Sex: male Admission Date (Current Location): 12/23/2019  [place] and [place] Number:  Engineering geologist and Address:  [facility], [address] [place], [place] [address]      Provider Number: [id number]  Attending Physician Name and Address:  Jennye Boroughs, MD  Relative Name and Phone Number:  [name] [phone]    Current Level of Care: Hospital Recommended Level of Care: [place] Prior Approval Number:    Date Approved/Denied:   PASRR Number: [id number]  Discharge Plan:      Current Diagnoses: Patient Active Problem List   Diagnosis Date Noted  . COPD with acute exacerbation ([place]) 12/23/2019  . Pulmonary embolism ([place]) 12/23/2019  . Acute on chronic respiratory failure with hypoxia ([place]) 12/23/2019  . Acute respiratory failure with hypoxia ([place]) 12/23/2019  . CAP (community acquired pneumonia) 12/04/2019  . Tobacco abuse 12/04/2019  . Dermatitis 11/24/2019  . Senile ecchymosis 08/02/2019  . B12 nutritional deficiency 06/07/2019  . Memory changes 04/24/2018  . Excoriation of scalp 04/24/2018  . Paroxysmal atrial fibrillation ([place]) 06/13/2017  . Elevated blood sugar 02/03/2017  . S/P amputation of lesser toe ([place]) 07/25/2015  . Peripheral vascular disease ([place]) 07/14/2015  . Toxic metabolic encephalopathy 45/36/4680  . AA (alcohol abuse) 05/23/2015  . SBO (small bowel obstruction) ([place]) 05/18/2015  . Urinary retention 05/18/2015  . Essential hypertension 05/18/2015  . Choroidal nevus, right eye 02/08/2015  . Carpal tunnel syndrome 01/13/2015  . H/O gastrointestinal disease 01/13/2015  . History of primary malignant neoplasm of urinary bladder 01/13/2015  . Compulsive tobacco user syndrome 01/13/2015  . Nicotine addiction 01/13/2015  . HLD (hyperlipidemia) 01/13/2015  . Neurosis, phobic  01/13/2015  . COPD exacerbation ([place]) 06/11/2011  . Lung nodule, solitary 06/11/2011    Orientation RESPIRATION BLADDER Height & Weight     Self, Place  O2 Incontinent, External catheter Weight: 156 lb 12.8 oz (71.1 kg) Height:  5\' 7"  (170.2 cm)  BEHAVIORAL SYMPTOMS/MOOD NEUROLOGICAL BOWEL NUTRITION STATUS      Continent    AMBULATORY STATUS COMMUNICATION OF NEEDS Skin   Extensive Assist Verbally                         Personal Care Assistance Level of Assistance  Bathing, Feeding, Dressing Bathing Assistance: Limited assistance Feeding assistance: Limited assistance Dressing Assistance: Limited assistance     Functional Limitations Info             SPECIAL CARE FACTORS FREQUENCY  PT (By licensed PT), OT (By licensed OT)     PT Frequency: 5 x weekly OT Frequency: 5 x weekly            Contractures      Additional Factors Info  Code Status, Allergies Code Status Info: Full Allergies Info: influenza vaccine, lipitor           Current Medications (12/25/2019):  This is the current hospital active medication list Current Facility-Administered Medications  Medication Dose Route Frequency Provider Last Rate Last Admin  . amoxicillin-clavulanate (AUGMENTIN) 875-125 MG per tablet 1 tablet  1 tablet Oral Q12H Jennye Boroughs, MD      . clonazePAM Bobbye [place]) disintegrating tablet 0.25 mg  0.25 mg Oral TID Delana Meyer Dolores Lory, MD   0.25 mg at 12/25/19 1528  . docusate sodium (COLACE) capsule 100 mg  100 mg  Oral BID Delana Meyer Dolores Lory, MD   100 mg at 12/25/19 4680  . gabapentin (NEURONTIN) capsule 100 mg  100 mg Oral QHS Schnier, Dolores Lory, MD   100 mg at 12/24/19 2102  . heparin ADULT infusion 100 units/mL (25000 units/265mL sodium chloride 0.45%)  1,500 Units/hr Intravenous Continuous Jennye Boroughs, MD 15 mL/hr at 12/25/19 1215 1,500 Units/hr at 12/25/19 1215  . HYDROmorphone (DILAUDID) injection 1 mg  1 mg Intravenous Once PRN Schnier, Dolores Lory, MD      .  ipratropium-albuterol (DUONEB) 0.5-2.5 (3) MG/3ML nebulizer solution 3 mL  3 mL Nebulization TID Schnier, Dolores Lory, MD   3 mL at 12/25/19 1432  . mometasone-formoterol (DULERA) 100-5 MCG/ACT inhaler 2 puff  2 puff Inhalation BID Schnier, Dolores Lory, MD   2 puff at 12/25/19 [phone]  . morphine 2 MG/ML injection 2 mg  2 mg Intravenous Q4H PRN Jennye Boroughs, MD   2 mg at 12/24/19 1735  . multivitamin with minerals tablet 1 tablet  1 tablet Oral Daily Schnier, Dolores Lory, MD   1 tablet at 12/25/19 [phone]  . nicotine (NICODERM CQ - dosed in mg/24 hours) patch 14 mg  14 mg Transdermal Daily Schnier, Dolores Lory, MD   14 mg at 12/25/19 0939  . OLANZapine (ZYPREXA) tablet 1.25 mg  1.25 mg Oral QHS Schnier, Dolores Lory, MD   1.25 mg at 12/24/19 2102  . ondansetron (ZOFRAN) injection 4 mg  4 mg Intravenous Q6H PRN Schnier, Dolores Lory, MD      . ondansetron [facility]) tablet 4 mg  4 mg Oral Q6H PRN Schnier, Dolores Lory, MD      . sucralfate (CARAFATE) tablet 1 g  1 g Oral TID WC & HS Schnier, Dolores Lory, MD   1 g at 12/25/19 1150  . triamcinolone ointment (KENALOG) 0.5 % 1 application  1 application Topical BID Schnier, Dolores Lory, MD   1 application at 50/03/70 [phone]     Discharge Medications: Please see discharge summary for a list of discharge medications.  Relevant Imaging Results:  Relevant Lab Results:   Additional Information SS: [id number]  Meriel Flavors, LCSW

## 2019-12-25 NOTE — Consult Note (Signed)
[place] for Eliquis Indication: pulmonary embolus/DVT  Allergies  Allergen Reactions   Influenza Vaccines     High blood pressure- had to be admitted   Lipitor [Atorvastatin Calcium]     Muscle pain   Patient Measurements: Height: 5\' 7"  (170.2 cm) Weight: 71.1 kg (156 lb 12.8 oz) IBW/kg (Calculated) : 66.1   Vital Signs: Temp: 98.5 F (36.9 C) (10/02 1642) Temp Source: Oral (10/02 1642) BP: 120/74 (10/02 1642) Pulse Rate: 100 (10/02 1642)  Labs: Recent Labs    12/23/19 0815 12/23/19 2054 12/24/19 0540 12/25/19 0152 12/25/19 0603 12/25/19 1131  HGB 14.2  --  11.4*  --  12.1*  --   HCT 44.5  --  36.4*  --  38.6*  --   PLT 194  --  170  --  159  --   APTT 27  --   --   --   --   --   LABPROT 12.5  --   --   --   --   --   INR 1.0  --   --   --   --   --   HEPARINUNFRC  --    < > 0.18* <0.10*  --  0.16*  CREATININE 1.25*  --  0.97  --  0.96  --   TROPONINIHS 27*  --   --   --   --   --    < > = values in this interval not displayed.   Estimated Creatinine Clearance: 63.1 mL/min (by C-G formula based on SCr of 0.96 mg/dL).  Medications:  Confirmed w/patient no PTA anticoagulants.     Assessment: Pharmacy has been consulted for Eliquis dosing a patient with a PE.    Heparin drip will stop with first dose of Eliquis.   Goal of Therapy:  Monitor platelets by anticoagulation protocol: Yes   Plan:  Will start Eliquis 10mg  bid x 7 days, followed by Eliquis 5mg  bid thereafter  --CBC/SCr a minimum of every 3 days per protocol  Lu Duffel, PharmD, BCPS Clinical Pharmacist 12/25/2019 5:17 PM

## 2019-12-25 NOTE — Consult Note (Signed)
[place] for Heparin  Indication: pulmonary embolus  Allergies  Allergen Reactions  . Influenza Vaccines     High blood pressure- had to be admitted  . Lipitor [Atorvastatin Calcium]     Muscle pain   Patient Measurements: Height: 5\' 7"  (170.2 cm) Weight: 71.1 kg (156 lb 12.8 oz) IBW/kg (Calculated) : 66.1 Heparin Dosing Weight: 68 kg   Vital Signs: Temp: 98.9 F (37.2 C) (10/02 0823) Temp Source: Oral (10/02 0823) BP: 132/90 (10/02 0823) Pulse Rate: 99 (10/02 0823)  Labs: Recent Labs    12/23/19 0815 12/23/19 2054 12/24/19 0540 12/25/19 0152 12/25/19 0603 12/25/19 1131  HGB 14.2  --  11.4*  --  12.1*  --   HCT 44.5  --  36.4*  --  38.6*  --   PLT 194  --  170  --  159  --   APTT 27  --   --   --   --   --   LABPROT 12.5  --   --   --   --   --   INR 1.0  --   --   --   --   --   HEPARINUNFRC  --    < > 0.18* <0.10*  --  0.16*  CREATININE 1.25*  --  0.97  --  0.96  --   TROPONINIHS 27*  --   --   --   --   --    < > = values in this interval not displayed.   Estimated Creatinine Clearance: 63.1 mL/min (by C-G formula based on SCr of 0.96 mg/dL).  Medications:  Confirmed w/patient no PTA anticoagulants   Assessment: Pharmacy has been consulted for heparin dosing a patient with a PE. Baseline labs are appropriate to start heparin.   Baseline aPTT 27s, baseline INR 1 9/30 2054 HL 0.37, therapeutic x 1 10/1 0540 HL 0.18, bolus 2000 units and increase rate to 1250 units/hr 10/2 0152 HL < 0.10: d/w nurse, heparin has been off, line was occluded, plan to restart now at same rate (1250 units/hr)  Goal of Therapy:  Heparin level 0.3-0.7 units/ml Monitor platelets by anticoagulation protocol: Yes   Plan:  --10/2 at 1131 HL=0.16, is subtherapeutic,  Heparin 2100 units bolus ordered and increase in drip to 1500 units/hr. --Re-check Heparin level at 1800, f/u for transition to PO anticoag. --Daily CBC per protocol  Chinita Greenland PharmD Clinical Pharmacist 12/25/2019

## 2019-12-25 NOTE — Consult Note (Signed)
Pharmacy Antibiotic Note  Brady Smith is a 74 y.o. male admitted on 12/23/2019 with aspiration pneumonia. PMH significant for COPD, GERD, PVD, depression, and nicotine dependence. Patient came to ED c/o SOB initially with exertion but now is SOB at rest. Patient was hospitalized for ~1 week for CAP (9/11-9/18). 9/30 CXR most likely representing PNA in R lung; L lung clear. CT angiogram positive for PE and consolidation concerning for infection aspiration. Pharmacy has been consulted for Unasyn dosing.  Day 3 IV antibiotics;   WBC 12.8>7.1>10.0>7.4; Afebrile.  PCT <0.10 on 9/30  Plan:  Unasyn 3g IV q6h Monitor renal function and adjust dose as clinically indicated  Height: 5\' 7"  (170.2 cm) Weight: 71.1 kg (156 lb 12.8 oz) IBW/kg (Calculated) : 66.1  Temp (24hrs), Avg:98 F (36.7 C), Min:97.5 F (36.4 C), Max:98.9 F (37.2 C)  Recent Labs  Lab 12/23/19 0815 12/23/19 0834 12/24/19 0540 12/25/19 0603  WBC 7.1  --  10.0 7.4  CREATININE 1.25*  --  0.97 0.96  LATICACIDVEN  --  2.0*  --   --     Estimated Creatinine Clearance: 63.1 mL/min (by C-G formula based on SCr of 0.96 mg/dL).    Allergies  Allergen Reactions  . Influenza Vaccines     High blood pressure- had to be admitted  . Lipitor [Atorvastatin Calcium]     Muscle pain    Antimicrobials this admission: 9/30 Unasyn >>   Microbiology results: 9/30 BCx: pending   Thank you for allowing pharmacy to be a part of this patient's care.  Chinita Greenland PharmD Clinical Pharmacist 12/25/2019

## 2019-12-26 LAB — CBC
HCT: 35.9 % — ABNORMAL LOW (ref 39.0–52.0)
Hemoglobin: 11.3 g/dL — ABNORMAL LOW (ref 13.0–17.0)
MCH: 30.8 pg (ref 26.0–34.0)
MCHC: 31.5 g/dL (ref 30.0–36.0)
MCV: 97.8 fL (ref 80.0–100.0)
Platelets: 138 10*3/uL — ABNORMAL LOW (ref 150–400)
RBC: 3.67 MIL/uL — ABNORMAL LOW (ref 4.22–5.81)
RDW: 14.1 % (ref 11.5–15.5)
WBC: 5 10*3/uL (ref 4.0–10.5)
nRBC: 0 % (ref 0.0–0.2)

## 2019-12-26 NOTE — Evaluation (Signed)
Physical Therapy Evaluation Patient Details Name: Brady Smith MRN: [id number] DOB: Feb 24, 1946 Today's Date: 12/26/2019   History of Present Illness  Patient is a 74 y.o. male male with medical history significant for nicotine dependence, COPD, depression, GERD, peripheral vascular disease, recent hospitalization for pneumonia from September 11 through December 11, 2019. He presented to the hospital for evaluation  of shortness of breath. Work up revealed bilateral PE and RLE DVT treated with IV heparin infusion. S/p thrombosis thrombolysis and mechanical thrombectomy of left upper and lower lobe pulmonary arteries 12/24/19.   Clinical Impression  PT evaluation complete. Patient sitting up in chair on arrival to room, 1:1 sitter present. Patient is cooperative and agreeable to PT, follows single step commands without difficulty. Patient needs Min guard assistance for sit to stand transfers, performed multiple times from recliner chair. Min guard assistance also provided with ambulating 2 bouts of 30 ft. Seated rest break between walking bouts for energy conservation. Sp02 92-93% after walking on 4 L02. Good standing balance as patient is able to maintain during external perturbations and while reaching outside base of support. Patient reports he lives alone in a camper (patient is a poor historian and difficult to understand at times). Recommend SNF placement for continued PT as patient would likely be unsafe to return home alone. Recommend PT to address functional limitations to maximize independence.      Follow Up Recommendations SNF    Equipment Recommendations   (to be determined at next level of care )    Recommendations for Other Services       Precautions / Restrictions Precautions Precautions: Fall Restrictions Weight Bearing Restrictions: No      Mobility  Bed Mobility               General bed mobility comments: not addressed this session as patient sitting up on  arrival to room and post session   Transfers Overall transfer level: Needs assistance   Transfers: Sit to/from Stand Sit to Stand: Min guard         General transfer comment: Min guard for safety. 2 standing bouts performed   Ambulation/Gait Ambulation/Gait assistance: Min guard Gait Distance (Feet): 30 Feet (x 2 bouts ) Assistive device: None Gait Pattern/deviations: Narrow base of support Gait velocity: decreased   General Gait Details: verbal cues for safety and technique with negotiation around obstacles in room. no gross loss of balance with ambulation. Sp02 92% after walking on 4L 02. short seated rest break between the 2 bouts of walking for energy conservation   Stairs            Wheelchair Mobility    Modified Rankin (Stroke Patients Only)       Balance Overall balance assessment: Needs assistance Sitting-balance support: Feet supported Sitting balance-Leahy Scale: Good     Standing balance support: During functional activity;No upper extremity supported Standing balance-Leahy Scale: Good Standing balance comment: patient maintains balance while reaching outside base of support with BUE and with moderate challenged with external perturbations                              Pertinent Vitals/Pain Pain Assessment: No/denies pain    Home Living Family/patient expects to be discharged to:: Private residence Living Arrangements: Alone Available Help at Discharge: Available PRN/intermittently;Friend(s) Type of Home:  (camper )           Additional Comments: Patient is a poor historian. He reports  he lives in a camper but has friends near by that can help if needed.     Prior Function Level of Independence: Independent         Comments: Patient reports he drives every day and does not use an assistive device for ambulation      Hand Dominance   Dominant Hand: Right    Extremity/Trunk Assessment   Upper Extremity Assessment Upper  Extremity Assessment: Generalized weakness    Lower Extremity Assessment Lower Extremity Assessment: Generalized weakness       Communication   Communication:  (hard to understand at times. speech tangential at times )  Cognition Arousal/Alertness: Awake/alert Behavior During Therapy: WFL for tasks assessed/performed Overall Cognitive Status: No family/caregiver present to determine baseline cognitive functioning                                 General Comments: patient cooperative and follows all single step commands without difficulty. 1:1 sitter is in the room. oriented x 4       General Comments      Exercises General Exercises - Lower Extremity Long Arc Quad: Strengthening;AAROM;Both;10 reps;Seated Hip Flexion/Marching: AROM;Strengthening;Both;10 reps;Seated Other Exercises Other Exercises: verbal cues for exercise technique    Assessment/Plan    PT Assessment Patient needs continued PT services  PT Problem List Decreased strength;Decreased activity tolerance;Decreased balance;Decreased mobility;Decreased cognition;Decreased knowledge of use of DME;Decreased safety awareness;Decreased knowledge of precautions;Cardiopulmonary status limiting activity       PT Treatment Interventions DME instruction;Gait training;Stair training;Functional mobility training;Therapeutic activities;Therapeutic exercise;Balance training;Neuromuscular re-education;Cognitive remediation    PT Goals (Current goals can be found in the Care Plan section)  Acute Rehab PT Goals Patient Stated Goal: to walk  PT Goal Formulation: With patient Time For Goal Achievement: 01/09/20 Potential to Achieve Goals: Fair    Frequency Min 2X/week   Barriers to discharge Inaccessible home environment patient reports he lives in a camper     Co-evaluation               AM-PAC PT "6 Clicks" Mobility  Outcome Measure Help needed turning from your back to your side while in a flat bed  without using bedrails?: None Help needed moving from lying on your back to sitting on the side of a flat bed without using bedrails?: A Little Help needed moving to and from a bed to a chair (including a wheelchair)?: A Little Help needed standing up from a chair using your arms (e.g., wheelchair or bedside chair)?: A Little Help needed to walk in hospital room?: A Little Help needed climbing 3-5 steps with a railing? : A Lot 6 Click Score: 18    End of Session Equipment Utilized During Treatment: Gait belt;Oxygen Activity Tolerance: Patient tolerated treatment well Patient left: in chair;with call bell/phone within reach;with nursing/sitter in room Nurse Communication: Other (comment);Mobility status (discussed mobility status with 1:1 sitter) PT Visit Diagnosis: Unsteadiness on feet (R26.81);Muscle weakness (generalized) (M62.81)    Time: 2620-3559 PT Time Calculation (min) (ACUTE ONLY): 23 min   Charges:   PT Evaluation $PT Eval Moderate Complexity: 1 Mod PT Treatments $Therapeutic Activity: 8-22 mins        Minna Merritts, PT, MPT   Percell Locus 12/26/2019, 1:36 PM
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
## 2019-12-26 NOTE — Evaluation (Signed)
Occupational Therapy Evaluation Patient Details Name: Brady Smith MRN: [id number] DOB: December 26, 1945 Today's Date: 12/26/2019    History of Present Illness Brady Smith is a 74 y.o. male male with medical history significant for nicotine dependence, COPD, depression, GERD, peripheral vascular disease, recent hospitalization for pneumonia from September 11 through December 11, 2019. He presented to the hospital for evaluation  of shortness of breath. He said he has been feeling short of breath for about 2 weeks prior to admission and is at progressively worsened. Work-up revealed acute B pulmonary embolism with large clot burden and RLE DVT   Clinical Impression   Brady Smith was seen for OT evaluation this date. Pt is poor historian as he has garbled and tangential speech; however pt reports living alone in house c ramped entrance. Pt presents conflicting information stating he drives and does not use AD, then states he has had to lay in bed for the last 3 months. Pt presents to acute OT demonstrating impaired ADL performance, functional cognition, and functional mobility 2/2 decreased safety awareness, functional strength/balance deficits, and decreased activity tolerance. Pt currently requires CGA + RW for ADL t/f and VCs for safety. MOD I don/doff socks long sitting in bed, anticipate MIN A for safety for LBD in standing. SBA toileting and perihygiene on BSC. CGA + single UE support + VCs for rest breaks for 20" standing functional reach. Pt would benefit from skilled OT to address noted impairments and functional limitations (see below for any additional details) in order to maximize safety and independence while minimizing falls risk and caregiver burden. Upon hospital discharge, recommend SNF to maximize pt safety and return to PLOF.   Reclined: SpO2 93% on 4L [place] Seated EOB: SpO2 89% on 4L [place] BSC t/f ~5 ft: SpO2 84-86% on 4L [place] - resolved c VCs and prolonged seated rest to 96% on 4L .     Follow Up Recommendations  SNF   Equipment Recommendations  3 in 1 bedside commode    Recommendations for Other Services       Precautions / Restrictions Precautions Precautions: Fall Restrictions Weight Bearing Restrictions: No      Mobility Bed Mobility Overal bed mobility: Modified Independent    General bed mobility comments: Moves easily in bed, assist for O2 line mgmt   Transfers Overall transfer level: Needs assistance Equipment used: Rolling walker (2 wheeled) Transfers: Sit to/from [facility] Sit to Stand: Min guard Stand pivot transfers: Min guard    General transfer comment: CGA + RW + VCs for safety     Balance Overall balance assessment: Needs assistance Sitting-balance support: Feet supported Sitting balance-Leahy Scale: Good     Standing balance support: During functional activity;No upper extremity supported Standing balance-Leahy Scale: Fair        ADL either performed or assessed with clinical judgement   ADL Overall ADL's : Needs assistance/impaired        General ADL Comments: MOD I don/doff socks long sitting in bed. CGA + RW for BSC t/f, SUPERVISION toileting and perihygiene. SBA + VCs for rest breaks for standing grooming tasks.                   Pertinent Vitals/Pain Pain Assessment: No/denies pain     Hand Dominance Right   Extremity/Trunk Assessment Upper Extremity Assessment Upper Extremity Assessment: Generalized weakness   Lower Extremity Assessment Lower Extremity Assessment: Generalized weakness       Communication Communication Communication: Other (comment) (Garbled and  tangential speech )   Cognition Arousal/Alertness: Awake/alert Behavior During Therapy: Agitated Overall Cognitive Status: No family/caregiver present to determine baseline cognitive functioning          General Comments: Per CH[facility] pt attempted to leave AMA yesterday. States "if I can walk more my oxygen will get better"  and requires cues t/o for safety.    General Comments  Reclined: SpO2 93% on 4L [place]. Seated EOB: SpO2 89% on 4L [place]. Toilet t/f ~5 ft: SpO2 84-86% on 4L Brady Smith - resolved c VCs and seated rest to 96% on 4L [place] Brady Smith.     Exercises Exercises: Other exercises Other Exercises Other Exercises: Pt educated re: OT role, DME recs, d/c recs, ECS, falls prevention, safe RW techqniue Other Exercises: Toileting, self-feeding, sup<>sit, sit<>stand, rolling, sitting/standing balance/tolerance, SPT, functional reach        Home Living Family/patient expects to be discharged to:: Private residence Living Arrangements: Alone Available Help at Discharge: Family;Friend(s);Available PRN/intermittently Type of Home: House Home Access: Ramped entrance    Home Equipment: Walker - 2 wheels   Additional Comments: Pt is difficult to understand and tangential speech. States he lives in his own place attached to "a families house with all kinds of animals - pigs, chickens"      Prior Functioning/Environment Level of Independence: Independent        Comments: Pt reports conflicting information. States he drives and does not use AD, then states he has had to lay in bed for the last 3 months        OT Problem List: Decreased strength;Decreased activity tolerance;Impaired balance (sitting and/or standing);Decreased safety awareness;Decreased knowledge of use of DME or AE;Cardiopulmonary status limiting activity      OT Treatment/Interventions: Self-care/ADL training;Therapeutic exercise;Energy conservation;DME and/or AE instruction;Therapeutic activities;Balance training;Patient/family education;Cognitive remediation/compensation    OT Goals(Current goals can be found in the care plan section) Acute Rehab OT Goals Patient Stated Goal: To go home OT Goal Formulation: With patient Time For Goal Achievement: 01/09/20 Potential to Achieve Goals: Fair ADL Goals Pt Will Perform Grooming: with modified  independence;standing Pt Will Perform Lower Body Dressing: with modified independence;sit to/from stand (c LRAD PRN) Pt Will Transfer to Toilet: with modified independence;ambulating;bedside commode (c LRAD PRN) Additional ADL Goal #1: Pt will Independently verbalize plan to implement x3 ECS  OT Frequency: Min 1X/week   Barriers to D/C: Decreased caregiver support             AM-PAC OT "6 Clicks" Daily Activity     Outcome Measure Help from another person eating meals?: None Help from another person taking care of personal grooming?: A Little Help from another person toileting, which includes using toliet, bedpan, or urinal?: A Little Help from another person bathing (including washing, rinsing, drying)?: A Little Help from another person to put on and taking off regular upper body clothing?: A Little Help from another person to put on and taking off regular lower body clothing?: A Little 6 Click Score: 19   End of Session Equipment Utilized During Treatment: Rolling walker;Oxygen Nurse Communication: Mobility status  Activity Tolerance: Patient tolerated treatment well Patient left: in chair;with call bell/phone within reach;with nursing/sitter in room  OT Visit Diagnosis: Other abnormalities of gait and mobility (R26.89)                Time: 9622-2979 OT Time Calculation (min): 28 min Charges:  OT General Charges $OT Visit: 1 Visit OT Evaluation $OT Eval Moderate Complexity: 1 Mod OT Treatments $Self Care/Home Management :  8-22 mins $Therapeutic Activity: 8-22 mins  Dessie Coma, M.S. OTR/L  12/26/19, 10:54 AM  ascom [phone]

## 2019-12-27 LAB — CBC
HCT: 33 % — ABNORMAL LOW (ref 39.0–52.0)
Hemoglobin: 10.6 g/dL — ABNORMAL LOW (ref 13.0–17.0)
MCH: 30.8 pg (ref 26.0–34.0)
MCHC: 32.1 g/dL (ref 30.0–36.0)
MCV: 95.9 fL (ref 80.0–100.0)
Platelets: 145 10*3/uL — ABNORMAL LOW (ref 150–400)
RBC: 3.44 MIL/uL — ABNORMAL LOW (ref 4.22–5.81)
RDW: 14.2 % (ref 11.5–15.5)
WBC: 5.3 10*3/uL (ref 4.0–10.5)
nRBC: 0 % (ref 0.0–0.2)

## 2019-12-27 LAB — BASIC METABOLIC PANEL
Anion gap: 8 (ref 5–15)
BUN: 10 mg/dL (ref 8–23)
CO2: 29 mmol/L (ref 22–32)
Calcium: 8.4 mg/dL — ABNORMAL LOW (ref 8.9–10.3)
Chloride: 104 mmol/L (ref 98–111)
Creatinine, Ser: 0.91 mg/dL (ref 0.61–1.24)
GFR calc Af Amer: >60 mL/min (ref >60)
GFR calc non Af Amer: >60 mL/min (ref >60)
Glucose, Bld: 106 mg/dL — ABNORMAL HIGH (ref 70–99)
Potassium: 3.8 mmol/L (ref 3.5–5.1)
Sodium: 141 mmol/L (ref 135–145)

## 2019-12-27 LAB — MAGNESIUM: Magnesium: 2.1 mg/dL (ref 1.7–2.4)

## 2019-12-27 MED ORDER — AMLODIPINE BESYLATE 10 MG PO TABS: ORAL_TABLET | ORAL | 2 refills | Status: DC

## 2019-12-27 MED ORDER — APIXABAN (ELIQUIS) VTE STARTER PACK (10MG AND 5MG): ORAL_TABLET | ORAL | 0 refills | Status: DC

## 2019-12-27 MED ORDER — MOMETASONE FURO-FORMOTEROL FUM 100-5 MCG/ACT IN AERO: 2.0000 | INHALATION_SPRAY | Freq: Two times a day (BID) | RESPIRATORY_TRACT | 0 refills | Status: DC

## 2019-12-27 MED ORDER — APIXABAN 5 MG PO TABS
10.0000 mg | ORAL_TABLET | Freq: Once | ORAL | Status: AC
  Administered 2019-12-27: 10 mg via ORAL
  Filled 2019-12-27: qty 2

## 2019-12-27 NOTE — Care Management Important Message (Signed)
Important Message  Patient Details  Name: Brady Smith MRN: [id number] Date of Birth: 02-26-1946   Medicare Important Message Given:  Yes     Dannette Barbara 12/27/2019, 12:56 PM

## 2019-12-27 NOTE — Progress Notes (Signed)
AVS given to patient and all instructions/medications have been reviewed. Pt verbalized understanding. Cab called for patient and patient being transported to [facility] were his car is. No further questions or concerns at this time.   Thresa Ross, RN

## 2019-12-27 NOTE — Discharge Summary (Signed)
Physician Discharge Summary   Brady Smith  male DOB: September 07, 1945  TGG:[id number]  PCP: Glean Hess, MD  Admit date: 12/23/2019 Discharge date: 12/27/2019  Admitted From: home Disposition:  home Pt declined SNF rehab and HHPT.  Prior to discharge, pt was alert, oriented x4, understood his medical conditions and treatment plans, so had capacity to make his own decisions.  CODE STATUS: Full code  Discharge Instructions    Diet - low sodium heart healthy   Complete by: As directed    Discharge instructions   Complete by: As directed    As you know, you have large blood clots in your lungs.  Please be sure to take your blood thinner Eliquis as directed.    You still need some supplemental oxygen, which we have ordered for you to go home with.  Please follow up with your outpatient primary care doctor to monitor your oxygen needs.  We had recommended short-term rehab for you, however, you have declined.  Since you are alert, oriented and able to make your own decision, and want to go home, you are discharged to home.   Dr. Enzo Bi - -   Increase activity slowly   Complete by: As directed        Hospital Course:  For full details, please see H&P, progress notes, consult notes and ancillary notes.  Briefly,  Brady Taranto Newmanis a 74 y.o.malemalewith medical history significant fornicotine dependence, COPD, depression, GERD, peripheral vascular disease, recent hospitalization for pneumonia from September 11 through December 11, 2019. He presented to the hospital for evaluationof shortness of breath.  Upon EMS arrival he had room air pulse oximetry of 88% but with conversation his pulse oximetry dropped to 78%and he was placed on 15 L of oxygen.  Work-up revealed acute bilateral pulmonary embolism with large clot burden and right lower extremity DVT. He was treated with IV heparin infusion. Vascular surgeon was consulted and he underwent thrombolysis and mechanical  thrombectomy of left upper and lower lobe pulmonary arteries.   There was also findings on chest imaging concerning for aspiration pneumonia so he was treated with IV Unasyn and was subsequently switched to Augmentin.   # Acute bilateral PE and RLE DVT S/pthrombolysis and mechanical thrombectomy of left upper and lower lobe pulmonary arteries on 12/24/2019.  heparin gtt transitioned to Eliquis.  # Aspiration PNA Shown on chest imaging.  Pt started on IV Unasyn and transitioned to Augmentin for a 5-day course, completed.  # Acute hypoxic respiratory failure 2/2 to above Pt was treat for both PE and aspiration PNA.  O2 requirement was gradually weaned down.  Pt denied dyspnea and wanted to go home, so pt was discharged on 2L home oxygen.   # COPD DuoNeb TID and Dulera BID.    # Chronic back pain Pt was not on home narcotics, so IV dilaudid and IV morphine d/c'ed.  Continued home gabapentin.   Discharge Diagnoses:  Principal Problem:   Pulmonary embolism (HCC) Active Problems:   Essential hypertension   Nicotine addiction   Paroxysmal atrial fibrillation (HCC)   Tobacco abuse   COPD with acute exacerbation (HCC)   Acute respiratory failure with hypoxia [facility])    Discharge Instructions:  Allergies as of 12/27/2019      Reactions   Influenza Vaccines    High blood pressure- had to be admitted   Lipitor [atorvastatin Calcium]    Muscle pain      Medication List    STOP taking  these medications   aspirin 81 MG EC tablet     TAKE these medications   albuterol 108 (90 Base) MCG/ACT inhaler Commonly known as: VENTOLIN HFA Inhale 2 puffs into the lungs every 6 (six) hours as needed for wheezing or shortness of breath.   amLODipine 10 MG tablet Commonly known as: NORVASC Hold until outpatient doctor followup because blood pressure has been normal in the hospital. What changed:   how much to take  how to take this  when to take this  additional instructions     Apixaban Starter Pack (10mg  and 5mg ) Commonly known as: ELIQUIS STARTER PACK Take as directed on package: start with two-5mg  tablets twice daily for 7 days. On day 8, switch to one-5mg  tablet twice daily.   clonazePAM 0.25 MG disintegrating tablet Commonly known as: KLONOPIN Take 1 tablet (0.25 mg total) by mouth 3 (three) times daily.   docusate sodium 100 MG capsule Commonly known as: COLACE Take 1 capsule (100 mg total) by mouth 2 (two) times daily.   gabapentin 100 MG capsule Commonly known as: NEURONTIN Take 1 capsule (100 mg total) by mouth at bedtime.   ipratropium-albuterol 0.5-2.5 (3) MG/3ML Soln Commonly known as: DUONEB Take 3 mLs by nebulization 3 (three) times daily.   mometasone-formoterol 100-5 MCG/ACT Aero Commonly known as: DULERA Inhale 2 puffs into the lungs 2 (two) times daily.   multivitamin with minerals Tabs tablet Take 1 tablet by mouth daily.   nicotine 14 mg/24hr patch Commonly known as: NICODERM CQ - dosed in mg/24 hours Place 1 patch (14 mg total) onto the skin daily.   OLANZapine 2.5 MG tablet Commonly known as: ZYPREXA Take 0.5 tablets (1.25 mg total) by mouth at bedtime.   sucralfate 1 g tablet Commonly known as: CARAFATE Take 1 tablet (1 g total) by mouth 4 (four) times daily -  with meals and at bedtime.   triamcinolone ointment 0.5 % Commonly known as: KENALOG Apply 1 application topically 2 (two) times daily. To rash on leg   VITAMIN B-12 IJ Inject 1,000 mcg into the muscle every 30 (thirty) days.        Follow-up Information    Glean Hess, MD. Schedule an appointment as soon as possible for a visit in 1 week(s).   Specialty: Internal Medicine Contact information: [place] [address] [phone]        Kate Sable, MD .   Specialties: Cardiology, Radiology Contact information: [address] [place] [address] [phone]               Allergies  Allergen  Reactions  . Influenza Vaccines     High blood pressure- had to be admitted  . Lipitor [Atorvastatin Calcium]     Muscle pain     The results of significant diagnostics from this hospitalization (including imaging, microbiology, ancillary and laboratory) are listed below for reference.   Consultations:   Procedures/Studies: CT ABDOMEN PELVIS WO CONTRAST  Result Date: 12/07/2019 CLINICAL DATA:  Abdominal pain EXAM: CT ABDOMEN AND PELVIS WITHOUT CONTRAST TECHNIQUE: Multidetector CT imaging of the abdomen and pelvis was performed following the standard protocol without IV contrast. Oral contrast was administered. COMPARISON:  Abdominal radiographs December 06, 2019; CT abdomen and pelvis May 27, 2015 FINDINGS: Lower chest: There is consolidation in the right base. There is also a small area of consolidation in the inferior, lateral aspect of the right middle lobe. There are foci of coronary artery calcification. Hepatobiliary:  No focal liver lesions are evident. Gallbladder is absent. There is no appreciable biliary duct dilatation. Pancreas: There is no pancreatic mass or inflammatory focus. Spleen: No splenic lesions are evident. Adrenals/Urinary Tract: Adrenals bilaterally appear normal. There is no renal mass or hydronephrosis on either side. There is an extrarenal pelvis on the right, an anatomic variant. There is a 1 mm calculus in the mid right kidney. There is no ureteral calculus on either side. Urinary bladder is midline with wall thickness within normal limits. Stomach/Bowel: Stomach is filled with fluid. There is small bowel dilatation. There is evidence of there is a focal loop of markedly dilated small bowel in the anterior mid abdomen which contains oral contrast. Immediately distal to this localized marked bowel dilatation, there is a transition zone indicating site of small-bowel obstruction. There is contrast which passes through this area. There is no appreciable distal small bowel  obstruction. The terminal ileum appears normal. There is fatty infiltration in the ileocecal valve. There is no appreciable colonic dilatation. There is moderate stool in the colon. There is no appreciable free air or portal venous air. Vascular/Lymphatic: There is no abdominal aortic aneurysm. There is localized dilatation of the distal abdominal aorta measuring 3.6 x 3.2 cm with localized saccular dilatation in this area, also present on previous study with slightly greater dilatation compared to the previous study. There is extensive aortic and iliac artery atherosclerosis. Multiple more distal pelvic arterial calcifications are noted. No adenopathy is appreciable in the abdomen or pelvis. Reproductive: Prostate absent.  Seminal vesicles absent. Other: No periappendiceal region inflammation. There is a lower abdominal ventral hernia which contains loops of small bowel. The loop of markedly dilated proximal ileum is seen in this area of herniation; the transition zone does not occur as a consequence of this hernia, however. More superiorly, there is a midline ventral hernia containing fat but no bowel. This hernia measures 4.7 cm from right to left dimension. There is no abscess or ascites in the abdomen or pelvis. Musculoskeletal: Pars defects are noted at L5 bilaterally. There is 5 mm of anterolisthesis of L5 on S1. No blastic or lytic bone lesions. No intramuscular lesions are evident. IMPRESSION: 1. Small bowel obstruction with transition zone in the proximal to mid ileal region. A loop of markedly dilated small bowel is noted immediately proximal to the transition zone. This loop of small bowel has a maximum transverse diameter of 10.2 cm. This loop of bowel corresponds to the marked focal dilatation on radiograph from 1 day prior. Note that there is no colonic dilatation. No free air. 2. Midline ventral hernias. The larger hernia contains multiple loops of bowel. The dilated loop of proximal ileum  immediately proximal to the transition zone is noted within this hernia. However, the transition zone does not appear to represent a consequence of the hernia. There is a smaller midline ventral hernia containing fat but no bowel. 3. Areas of apparent pneumonia in the right middle lobe and right lower lobe regions. 4. Dilatation of the distal aorta with a measured transverse diameter of 3.6 x 3.2 cm, slightly increased from 2017 study. This area has a saccular appearance. Recommend referral to a vascular specialist. This recommendation follows ACR consensus guidelines: White Paper of the ACR Incidental Findings Committee II on Vascular Findings. J Am Coll Radiol 2013; 10:789-794. This recommendation follows ACR consensus guidelines: White Paper of the ACR Incidental Findings Committee II on Vascular Findings. J Am Coll Radiol 2013; 10:789-794. this recommendation follows ACR consensus  guidelines: White Paper of the ACR Incidental Findings Committee II on Vascular Findings. J Am Coll Radiol 2013; 10:789-794. 5. Pars defects at L5 bilaterally with 5 mm of anterolisthesis of L5 on S1. 6. Gallbladder absent. Prostate absent. These results will be called to the ordering clinician or representative by the Radiologist Assistant, and communication documented in the PACS or [employer]. Electronically Signed   By: Lowella Grip III M.D.   On: 12/07/2019 18:22   DG Chest 1 View  Result Date: 12/04/2019 CLINICAL DATA:  Pt with shob,chest pain and covid exposure. Pt with history of copd. Current smoker. shob, maybe covid exposure EXAM: CHEST  1 VIEW COMPARISON:  11/20/2019 FINDINGS: Patient rotated rightward. Normal cardiac silhouette. Potential patchy airspace disease in the RIGHT lower lobe. LEFT lung relatively clear. No pneumothorax. IMPRESSION: Patchy airspace disease in the RIGHT lower lobe could represent early pneumonia. Electronically Signed   By: Suzy Bouchard M.D.   On: 12/04/2019 07:37   DG Chest 2  View  Result Date: 12/18/2019 CLINICAL DATA:  Chest pain EXAM: CHEST - 2 VIEW COMPARISON:  12/04/2019 FINDINGS: Airspace disease in the posterior right lower lobe, subpleural and somewhat focal on the lateral view. No visible effusion or pneumothorax. Normal heart size and mediastinal contours. IMPRESSION: Right lower lobe infiltrate, usually pneumonia. The opacity is subpleural and there is a provided history of chest pain; a CTA could be performed if there is clinical concern for pulmonary infarct. Electronically Signed   By: Monte Fantasia M.D.   On: 12/18/2019 11:40   DG Abd 1 View  Result Date: 12/09/2019 CLINICAL DATA:  Small bowel obstruction EXAM: ABDOMEN - 1 VIEW COMPARISON:  December 06, 2019; CT abdomen and pelvis December 07, 2019 FINDINGS: There remains a loop of dilated bowel stay right of midline in the mid abdomen which on previous CT examination which shown to represent a loop of small bowel. No other appreciable bowel dilatation evident. No air-fluid levels. No free air. Extensive arterial vascular calcification noted. There are surgical clips in the pelvis. There is patchy opacity in the right lung base. Lung bases otherwise clear. IMPRESSION: Loop of dilated bowel in the mid abdomen remains. No other appreciable bowel dilatation. No free air. Apparent persistent infiltrate right lung base. Electronically Signed   By: Lowella Grip III M.D.   On: 12/09/2019 09:14   CT Angio Chest PE W and/or Wo Contrast  Result Date: 12/23/2019 CLINICAL DATA:  Chest pain, shortness of breath EXAM: CT ANGIOGRAPHY CHEST WITH CONTRAST TECHNIQUE: Multidetector CT imaging of the chest was performed using the standard protocol during bolus administration of intravenous contrast. Multiplanar CT image reconstructions and MIPs were obtained to evaluate the vascular anatomy. CONTRAST:  61mL OMNIPAQUE IOHEXOL 350 MG/ML SOLN COMPARISON:  None. FINDINGS: Cardiovascular: Satisfactory opacification of the  pulmonary arteries to the segmental level. Positive examination for pulmonary embolism with a large burden of embolus present in the distal left pulmonary artery and lobar through segmental branches. There is a smaller burden of lobar to segmental embolus in the right lung. Normal heart size. The RV LV ratio is enlarged, approximately 1.5-1. No pericardial effusion. Aortic atherosclerosis. Three-vessel coronary artery calcifications. Mediastinum/Nodes: No enlarged mediastinal, hilar, or axillary lymph nodes. Frothy debris in the lower trachea. Thyroid gland and esophagus demonstrate no significant findings. Lungs/Pleura: Severe centrilobular emphysema. Diffuse bilateral bronchial wall thickening. There is extensive heterogeneous airspace opacity predominantly in the dependent right lower lobe with associated atelectasis or consolidation and a small right pleural  effusion. There is calcification of the visceral pleura of the right lower lobe. Upper Abdomen: No acute abnormality. Musculoskeletal: No chest wall abnormality. No acute or significant osseous findings. Review of the MIP images confirms the above findings. IMPRESSION: 1. Positive examination for pulmonary embolism with a large burden of embolus present in the distal left pulmonary artery and lobar through segmental branches. There is a smaller burden of lobar to segmental embolus in the right lung. 2. The RV LV ratio is enlarged, approximately 1.5-1, concerning for right heart strain. 3. There is extensive heterogeneous airspace opacity predominantly in the dependent right lower lobe with associated atelectasis or consolidation, concerning for infection or aspiration. This is in general not distal to occlusive embolus and does not likely reflect pulmonary infarction. 4. Small right pleural effusion with calcification of the visceral pleura of the right lower lobe, likely chronic and loculated. 5. Severe centrilobular emphysema. Emphysema (ICD10-J43.9). 6.  Coronary artery disease.  Aortic Atherosclerosis (ICD10-I70.0). These results were called by telephone at the time of interpretation on 12/23/2019 at 10:31 am to Dr. Delman Kitten , who verbally acknowledged these results. Electronically Signed   By: Eddie Candle M.D.   On: 12/23/2019 10:36   PERIPHERAL VASCULAR CATHETERIZATION  Result Date: 12/24/2019 See op note  US Venous Img Lower Bilateral (DVT)  Result Date: 12/23/2019 CLINICAL DATA:  Bilateral leg pain for several years EXAM: BILATERAL LOWER EXTREMITY VENOUS DOPPLER ULTRASOUND TECHNIQUE: Gray-scale sonography with graded compression, as well as color Doppler and duplex ultrasound were performed to evaluate the lower extremity deep venous systems from the level of the common femoral vein and including the common femoral, femoral, profunda femoral, popliteal and calf veins including the posterior tibial, peroneal and gastrocnemius veins when visible. The superficial great saphenous vein was also interrogated. Spectral Doppler was utilized to evaluate flow at rest and with distal augmentation maneuvers in the common femoral, femoral and popliteal veins. COMPARISON:  None. FINDINGS: RIGHT LOWER EXTREMITY Common Femoral Vein: No evidence of thrombus. Normal compressibility, respiratory phasicity and response to augmentation. Saphenofemoral Junction: No evidence of thrombus. Normal compressibility and flow on color Doppler imaging. Profunda Femoral Vein: No evidence of thrombus. Normal compressibility and flow on color Doppler imaging. Femoral Vein: No evidence of thrombus. Normal compressibility, respiratory phasicity and response to augmentation. Popliteal Vein: Thrombus is noted with decreased compressibility in the popliteal vein. Calf Veins: Anterior tibial and peroneal veins also demonstrate thrombus with decreased compressibility. Superficial Great Saphenous Vein: No evidence of thrombus. Normal compressibility. Venous Reflux:  None. Other Findings:   None. LEFT LOWER EXTREMITY Common Femoral Vein: No evidence of thrombus. Normal compressibility, respiratory phasicity and response to augmentation. Saphenofemoral Junction: No evidence of thrombus. Normal compressibility and flow on color Doppler imaging. Profunda Femoral Vein: No evidence of thrombus. Normal compressibility and flow on color Doppler imaging. Femoral Vein: No evidence of thrombus. Normal compressibility, respiratory phasicity and response to augmentation. Popliteal Vein: No evidence of thrombus. Normal compressibility, respiratory phasicity and response to augmentation. Calf Veins: No evidence of thrombus. Normal compressibility and flow on color Doppler imaging. Superficial Great Saphenous Vein: No evidence of thrombus. Normal compressibility. Venous Reflux:  None. Other Findings:  None. IMPRESSION: Deep venous thrombosis within the right popliteal, peroneal and anterior tibial veins. Electronically Signed   By: Inez Catalina M.D.   On: 12/23/2019 13:51   DG Chest Port 1 View  Result Date: 12/23/2019 CLINICAL DATA:  Decreased oxygen saturation EXAM: PORTABLE CHEST 1 VIEW COMPARISON:  December 18, 2019 FINDINGS:  There is persistent airspace opacity in the right mid and lower lung regions. There is a minimal right pleural effusion. The left lung is clear. Heart size and pulmonary vascularity are normal. No adenopathy. No bone lesions. IMPRESSION: Airspace opacity, most likely representing pneumonia in the mid and lower lung regions on the right with slightly more consolidation compared to most recent study. Rather minimal pleural effusion on the right noted. Left lung clear. Cardiac silhouette normal. Electronically Signed   By: Lowella Grip III M.D.   On: 12/23/2019 08:48   DG Abd Portable 1V  Result Date: 12/06/2019 CLINICAL DATA:  Cough, shortness of breath EXAM: PORTABLE ABDOMEN - 1 VIEW COMPARISON:  Radiograph 05/20/2015 FINDINGS: Markedly distended loop of bowel seen in the mid  abdomen, unclear if this reflects a loop of large or small bowel. Several additional loops of air-filled bowel with questionable fold thickening are noted in the immediate vicinity. Air and stool projects over the rectal vault. Moderate proximal colonic stool burden is noted. Cholecystectomy clips in the right upper quadrant. Vascular stenting, calcium and additional surgical clips throughout the pelvis. Degenerative changes in the spine, hips and pelvis. IMPRESSION: 1. Markedly distended loop of bowel in the mid abdomen, unclear if this reflects a loop of large or small bowel. Several additional loops of air-filled bowel with questionable fold thickening are noted in the immediate vicinity. Findings raise concern for a bowel obstruction. Consider further evaluation with CT. Electronically Signed   By: Lovena Le M.D.   On: 12/06/2019 16:01   ECHOCARDIOGRAM COMPLETE  Result Date: 12/24/2019    ECHOCARDIOGRAM REPORT   Patient Name:   EVAN MACKIE Date of Exam: 12/24/2019 Medical Rec #:  [id number]     Height:       67.0 in Accession #:    [id number]    Weight:       154.3 lb Date of Birth:  03-Apr-1945     BSA:          1.811 m Patient Age:    74 years      BP:           133/74 mmHg Patient Gender: M             HR:           70 bpm. Exam Location:  [facility] Procedure: 2D Echo, Cardiac Doppler and Color Doppler Indications:     CHF-acute diastolic 626.94  History:         Patient has prior history of Echocardiogram examinations, most                  recent 05/23/2015. COPD. Pneumonia, PVD.  Sonographer:     Sherrie Sport RDCS (AE) Referring Phys:  [id number] Collier Bullock Diagnosing Phys: Ida Rogue MD  Sonographer Comments: Suboptimal apical window. Image acquisition challenging due to COPD. IMPRESSIONS  1. Left ventricular ejection fraction, by estimation, is 60 to 65%. The left ventricle has normal function. The left ventricle has no regional wall motion abnormalities. Left ventricular diastolic parameters are  indeterminate.  2. Right ventricular systolic function is mildly reduced. The right ventricular size is moderately enlarged. There is moderately elevated pulmonary artery systolic pressure. The estimated right ventricular systolic pressure is 03.5 mmHg.  3. Right atrial size was mildly dilated.  4. Tricuspid valve regurgitation is mild to moderate.  5. Mild to moderate mitral valve regurgitation.  6. Rhythm is atrial fibrillation FINDINGS  Left Ventricle: Left ventricular ejection fraction, by estimation,  is 60 to 65%. The left ventricle has normal function. The left ventricle has no regional wall motion abnormalities. The left ventricular internal cavity size was normal in size. There is  no left ventricular hypertrophy. Left ventricular diastolic parameters are indeterminate. Right Ventricle: The right ventricular size is moderately enlarged. No increase in right ventricular wall thickness. Right ventricular systolic function is mildly reduced. There is moderately elevated pulmonary artery systolic pressure. The tricuspid regurgitant velocity is 3.22 m/s, and with an assumed right atrial pressure of 5 mmHg, the estimated right ventricular systolic pressure is 93.8 mmHg. Left Atrium: Left atrial size was normal in size. Right Atrium: Right atrial size was mildly dilated. Pericardium: There is no evidence of pericardial effusion. Mitral Valve: The mitral valve is normal in structure. Mild to moderate mitral valve regurgitation. No evidence of mitral valve stenosis. Tricuspid Valve: The tricuspid valve is normal in structure. Tricuspid valve regurgitation is mild to moderate. No evidence of tricuspid stenosis. Aortic Valve: The aortic valve is normal in structure. Aortic valve regurgitation is not visualized. No aortic stenosis is present. Aortic valve mean gradient measures 2.0 mmHg. Aortic valve peak gradient measures 4.5 mmHg. Aortic valve area, by VTI measures 2.81 cm. Pulmonic Valve: The pulmonic valve was  normal in structure. Pulmonic valve regurgitation is not visualized. No evidence of pulmonic stenosis. Aorta: The aortic root is normal in size and structure. Venous: The inferior vena cava is normal in size with greater than 50% respiratory variability, suggesting right atrial pressure of 3 mmHg. IAS/Shunts: No atrial level shunt detected by color flow Doppler.  LEFT VENTRICLE PLAX 2D LVIDd:         3.87 cm LVIDs:         2.60 cm LV PW:         1.36 cm LV IVS:        0.93 cm LVOT diam:     2.00 cm LV SV:         48 LV SV Index:   27 LVOT Area:     3.14 cm  RIGHT VENTRICLE RV S prime:     17.30 cm/s TAPSE (M-mode): 3.4 cm LEFT ATRIUM           Index       RIGHT ATRIUM           Index LA diam:      2.80 cm 1.55 cm/m  RA Area:     30.50 cm LA Vol (A4C): 90.3 ml 49.86 ml/m RA Volume:   121.00 ml 66.81 ml/m  AORTIC VALVE                   PULMONIC VALVE AV Area (Vmax):    2.50 cm    RVOT Peak grad: 3 mmHg AV Area (Vmean):   2.40 cm AV Area (VTI):     2.81 cm AV Vmax:           106.00 cm/s AV Vmean:          67.950 cm/s AV VTI:            0.171 m AV Peak Grad:      4.5 mmHg AV Mean Grad:      2.0 mmHg LVOT Vmax:         84.20 cm/s LVOT Vmean:        52.000 cm/s LVOT VTI:          0.153 m LVOT/AV VTI ratio: 0.89  AORTA Ao Root diam: 3.40  cm MITRAL VALVE               TRICUSPID VALVE MV Area (PHT): 3.72 cm    TR Peak grad:   41.5 mmHg MV Decel Time: 204 msec    TR Vmax:        322.00 cm/s MV E velocity: 99.00 cm/s                            SHUNTS                            Systemic VTI:  0.15 m                            Systemic Diam: 2.00 cm Ida Rogue MD Electronically signed by Ida Rogue MD Signature Date/Time: 12/24/2019/10:25:10 AM    Final       Labs: BNP (last 3 results) Recent Labs    12/18/19 1014 12/23/19 0815  BNP 165.1* 814.4*   Basic Metabolic Panel: Recent Labs  Lab 12/23/19 0815 12/24/19 0540 12/25/19 0603 12/27/19 0648  NA 142 141 142 141  K 3.5 3.6 4.4 3.8  CL 104  110 110 104  CO2 24 22 25 29   GLUCOSE 118* 137* 97 106*  BUN 18 16 14 10   CREATININE 1.25* 0.97 0.96 0.91  CALCIUM 8.6* 8.1* 8.4* 8.4*  MG  --   --  2.2 2.1   Liver Function Tests: Recent Labs  Lab 12/23/19 0815  AST 31  ALT 31  ALKPHOS 73  BILITOT 1.1  PROT 7.3  ALBUMIN 3.4*   No results for input(s): LIPASE, AMYLASE in the last 168 hours. No results for input(s): AMMONIA in the last 168 hours. CBC: Recent Labs  Lab 12/23/19 0815 12/24/19 0540 12/25/19 0603 12/26/19 0447 12/27/19 0648  WBC 7.1 10.0 7.4 5.0 5.3  NEUTROABS 5.6  --  6.0  --   --   HGB 14.2 11.4* 12.1* 11.3* 10.6*  HCT 44.5 36.4* 38.6* 35.9* 33.0*  MCV 96.3 97.3 99.0 97.8 95.9  PLT 194 170 159 138* 145*   Cardiac Enzymes: No results for input(s): CKTOTAL, CKMB, CKMBINDEX, TROPONINI in the last 168 hours. BNP: Invalid input(s): POCBNP CBG: No results for input(s): GLUCAP in the last 168 hours. D-Dimer No results for input(s): DDIMER in the last 72 hours. Hgb A1c No results for input(s): HGBA1C in the last 72 hours. Lipid Profile No results for input(s): CHOL, HDL, LDLCALC, TRIG, CHOLHDL, LDLDIRECT in the last 72 hours. Thyroid function studies No results for input(s): TSH, T4TOTAL, T3FREE, THYROIDAB in the last 72 hours.  Invalid input(s): FREET3 Anemia work up No results for input(s): VITAMINB12, FOLATE, FERRITIN, TIBC, IRON, RETICCTPCT in the last 72 hours. Urinalysis    Component Value Date/Time   COLORURINE YELLOW (A) 12/23/2019 1612   APPEARANCEUR HAZY (A) 12/23/2019 1612   APPEARANCEUR Hazy 07/27/2013 1041   LABSPEC 1.029 12/23/2019 1612   LABSPEC 1.010 07/27/2013 1041   PHURINE 7.0 12/23/2019 1612   GLUCOSEU NEGATIVE 12/23/2019 1612   GLUCOSEU Negative 07/27/2013 1041   HGBUR SMALL (A) 12/23/2019 1612   BILIRUBINUR NEGATIVE 12/23/2019 1612   BILIRUBINUR Negative 07/27/2013 1041   KETONESUR NEGATIVE 12/23/2019 1612   PROTEINUR NEGATIVE 12/23/2019 1612   NITRITE NEGATIVE  12/23/2019 1612   LEUKOCYTESUR TRACE (A) 12/23/2019 1612   LEUKOCYTESUR Negative 07/27/2013 1041  Sepsis Labs Invalid input(s): PROCALCITONIN,  WBC,  LACTICIDVEN Microbiology Recent Results (from the past 240 hour(s))  Blood culture (routine single)     Status: None (Preliminary result)   Collection Time: 12/23/19  8:12 AM   Specimen: BLOOD LEFT WRIST  Result Value Ref Range Status   Specimen Description BLOOD LEFT WRIST  Final   Special Requests   Final    BOTTLES DRAWN AEROBIC AND ANAEROBIC Blood Culture adequate volume   Culture   Final    NO GROWTH 4 DAYS Performed at [facility], [address] [place], [place] [address]    Report Status PENDING  Incomplete  Respiratory Panel by RT PCR (Flu A&B, Covid) - Nasopharyngeal Swab     Status: None   Collection Time: 12/23/19  8:45 AM   Specimen: Nasopharyngeal Swab  Result Value Ref Range Status   SARS Coronavirus 2 by RT PCR NEGATIVE NEGATIVE Final    Comment: (NOTE) SARS-CoV-2 target nucleic acids are NOT DETECTED.  The SARS-CoV-2 RNA is generally detectable in upper respiratoy specimens during the acute phase of infection. The lowest concentration of SARS-CoV-2 viral copies this assay can detect is 131 copies/mL. A negative result does not preclude SARS-Cov-2 infection and should not be used as the sole basis for treatment or other patient management decisions. A negative result may occur with  improper specimen collection/handling, submission of specimen other than nasopharyngeal swab, presence of viral mutation(s) within the areas targeted by this assay, and inadequate number of viral copies (<131 copies/mL). A negative result must be combined with clinical observations, patient history, and epidemiological information. The expected result is Negative.  Fact Sheet for Patients:  PinkCheek.be  Fact Sheet for Healthcare Providers:   GravelBags.it  This test is no t yet approved or cleared by the [place] FDA and  has been authorized for detection and/or diagnosis of SARS-CoV-2 by FDA under an Emergency Use Authorization (EUA). This EUA will remain  in effect (meaning this test can be used) for the duration of the COVID-19 declaration under Section 564(b)(1) of the Act, 21 U.S.C. section 360bbb-3(b)(1), unless the authorization is terminated or revoked sooner.     Influenza A by PCR NEGATIVE NEGATIVE Final   Influenza B by PCR NEGATIVE NEGATIVE Final    Comment: (NOTE) The Xpert Xpress SARS-CoV-2/FLU/RSV assay is intended as an aid in  the diagnosis of influenza from Nasopharyngeal swab specimens and  should not be used as a sole basis for treatment. Nasal washings and  aspirates are unacceptable for Xpert Xpress SARS-CoV-2/FLU/RSV  testing.  Fact Sheet for Patients: PinkCheek.be  Fact Sheet for Healthcare Providers: GravelBags.it  This test is not yet approved or cleared by the [place] FDA and  has been authorized for detection and/or diagnosis of SARS-CoV-2 by  FDA under an Emergency Use Authorization (EUA). This EUA will remain  in effect (meaning this test can be used) for the duration of the  Covid-19 declaration under Section 564(b)(1) of the Act, 21  U.S.C. section 360bbb-3(b)(1), unless the authorization is  terminated or revoked. Performed at [facility], [address] [place], [place] [address]      Total time spend on discharging this patient, including the last patient exam, discussing the hospital stay, instructions for ongoing care as it relates to all pertinent caregivers, as well as preparing the medical discharge records, prescriptions, and/or referrals as applicable, is 45 minutes.    Enzo Bi, MD  [facility] 12/27/2019, 2:24 PM  If  7PM-7AM, please contact  night-coverage

## 2019-12-27 NOTE — Progress Notes (Addendum)
Patient ambulated with sitter(Carolyn) in hall with walker on RA and O2 saturation dropped to 79%. Patient was then placed on 2 L [place] and and O2@ saturation increased to 89-90% while walking. Sitting on side of bed O2 came up to 98% on  2LNC. Resting state on RA 94%. No s/s of distress. Patent tolerated well.   Thresa Ross, RN

## 2019-12-27 NOTE — TOC Transition Note (Addendum)
Transition of Care [facility]) - CM/SW Discharge Note   Patient Details  Name: Brady Smith MRN: [id number] Date of Birth: Oct 26, 1945  Transition of Care [facility]) CM/SW Contact:  Candie Chroman, LCSW Phone Number: 12/27/2019, 3:38 PM   Clinical Narrative:  CSW met with patient. No supports at bedside. CSW introduced role and explained that PT recommendations would be discussed. Patient is not interested in SNF or home health. Patient will notify his PCP if he decides he wants home health after discharge. Patient uses a rolling walker when he goes out in the community. He is not on oxygen at home. Walking sats note is in. MD will enter DME order. CSW called [facility] representative to order. Patient has orders to discharge home today. Patient reports he will have a ride home. No further concerns. CSW signing off.   4:22 pm: Eliquis coupon on front of chart for patient to take with him. MD aware patient cannot use it with a starter pack, only traditional dosing. She will call pharmacy to try and provide verbal order to switch it. CSW signing off.  Final next level of care: Home/Self Care Barriers to Discharge: No Barriers Identified   Patient Goals and CMS Choice        Discharge Placement                       Discharge Plan and Services     Post Acute Care Choice: Durable Medical Equipment          DME Arranged: Oxygen DME Agency: [employer] Date DME Agency Contacted: 12/27/19   Representative spoke with at DME Agency: [place] Determinants of Health ([place]) Interventions     Readmission Risk Interventions No flowsheet data found.

## 2019-12-28 ENCOUNTER — Other Ambulatory Visit: Payer: Self-pay

## 2019-12-28 ENCOUNTER — Emergency Department
Admission: EM | Admit: 2019-12-28 | Discharge: 2019-12-28 | Disposition: A | Discharge status: Home / Self Care | Payer: Medicare Other | Attending: Emergency Medicine | Admitting: Emergency Medicine

## 2019-12-28 ENCOUNTER — Telehealth: Payer: Self-pay

## 2019-12-28 ENCOUNTER — Encounter: Payer: Self-pay | Admitting: Emergency Medicine

## 2019-12-28 DIAGNOSIS — R0602 Shortness of breath: Secondary | ICD-10-CM | POA: Insufficient documentation

## 2019-12-28 DIAGNOSIS — Z5321 Procedure and treatment not carried out due to patient leaving prior to being seen by health care provider: Secondary | ICD-10-CM | POA: Insufficient documentation

## 2019-12-28 DIAGNOSIS — Z01818 Encounter for other preprocedural examination: Secondary | ICD-10-CM | POA: Insufficient documentation

## 2019-12-28 LAB — CULTURE, BLOOD (SINGLE)
Culture: NO GROWTH
Special Requests: ADEQUATE

## 2019-12-28 NOTE — Telephone Encounter (Signed)
Attempted to reach patient for TCM call and scheduled hospital follow up appt. Voicemail not set up. Pt was d/c from [facility] on 12/27/19 and went back to ED on 12/28/19 but left without being seen after triage.

## 2019-12-28 NOTE — ED Triage Notes (Signed)
Pt called from [facility] to treatment room, no response 

## 2019-12-28 NOTE — ED Notes (Signed)
Pt called x's 3, no response ?

## 2019-12-28 NOTE — ED Triage Notes (Addendum)
Pt to triage via w/c with no distress noted; pt denies any c/o; st he is here because he couldn't get his portable O2 to work correctly [employer]); pt instructed on how to contact [place] for instructions on operation [phone]); upon pt return demonstration, machine appears to cut off immed after turning off even when plugged into outlet--appears battery dead; pt called [facility] and rep st will have someone return his call

## 2019-12-29 ENCOUNTER — Telehealth: Payer: Self-pay

## 2019-12-29 NOTE — Telephone Encounter (Signed)
Tried calling patient and his VM is not set up so can not leave patient a message. If he is switching providers to [facility] he just needs to see them for a follow up.  Unsure why he is scheduled with [facility] otherwise. Please advise patient of this if he calls back.   CM

## 2019-12-29 NOTE — Telephone Encounter (Unsigned)
Copied from [place] [id number]. Topic: Quick Communication - See Telephone Encounter >> Dec 29, 2019 10:33 AM Loma [place] wrote: CRM for notification. See Telephone encounter for: 12/29/19.Pt has been in hospital for a blood clot in lung, another in leg, wanting to get in ASAP for HFU to better understand his situation, looks like pt has also nmade appt with [place], please call pt if a work in is possible [phone] PT states very important

## 2019-12-30 ENCOUNTER — Telehealth: Payer: Self-pay

## 2019-12-30 ENCOUNTER — Ambulatory Visit: Payer: Self-pay | Admitting: *Deleted

## 2019-12-30 NOTE — Telephone Encounter (Signed)
Noted. We will see patient on 10/11 for hospital follow up.

## 2019-12-30 NOTE — Telephone Encounter (Signed)
Caller requesting appt due to recent discharge from [facility] for blood clots. Patient very anxious and reports he is feeling better and oxygen levels are better at 94%. Patient reports he had oxygen equipment sent home and it did not work. Encouraged patient to call number on equipment to report it was not working and patient says he tried but no answer so he threw equipment away. Patient only wanted appt. appt made for 01/03/20. Care advise given. Patient verbalized understanding of care advise and to call back if he had questions or any symptoms related to blood clots.   Reason for Disposition  Requesting regular office appointment  Answer Assessment - Initial Assessment Questions 1. REASON FOR CALL or QUESTION: "What is your reason for calling today?" or "How can I best help you?" or "What question do you have that I can help answer?"     I want to see Dr. Army Smith after I was recently discharged form [facility] hospital for blood clots.  Protocols used: INFORMATION ONLY CALL - NO TRIAGE-A-AH

## 2019-12-30 NOTE — Telephone Encounter (Signed)
Copied from [place]. Topic: General - Other >> Dec 30, 2019 10:37 AM Keene Breath wrote: Reason for CRM: Patient called to schedule a follow up with Dr. [name].  In his chart he was scheduled as a new patient with Cyndia Skeeters at [facility].  Patient says that was a mistake and still is a patient with Dr. [name].  Please advise and call patient to discuss.  CB# [phone]

## 2020-01-03 ENCOUNTER — Other Ambulatory Visit: Payer: Self-pay | Admitting: Internal Medicine

## 2020-01-03 ENCOUNTER — Encounter: Payer: Self-pay | Admitting: Internal Medicine

## 2020-01-03 ENCOUNTER — Telehealth: Payer: Self-pay | Admitting: Internal Medicine

## 2020-01-03 ENCOUNTER — Ambulatory Visit (INDEPENDENT_AMBULATORY_CARE_PROVIDER_SITE_OTHER): Payer: Medicare Other | Admitting: Internal Medicine

## 2020-01-03 ENCOUNTER — Other Ambulatory Visit: Payer: Self-pay

## 2020-01-03 VITALS — BP 102/68 | HR 88 | Temp 97.4°F | Ht 67.0 in | Wt 149.0 lb

## 2020-01-03 DIAGNOSIS — J431 Panlobular emphysema: Secondary | ICD-10-CM | POA: Diagnosis not present

## 2020-01-03 DIAGNOSIS — E538 Deficiency of other specified B group vitamins: Secondary | ICD-10-CM | POA: Diagnosis not present

## 2020-01-03 DIAGNOSIS — I2699 Other pulmonary embolism without acute cor pulmonale: Secondary | ICD-10-CM

## 2020-01-03 DIAGNOSIS — F5101 Primary insomnia: Secondary | ICD-10-CM | POA: Diagnosis not present

## 2020-01-03 DIAGNOSIS — I10 Essential (primary) hypertension: Secondary | ICD-10-CM

## 2020-01-03 MED ORDER — CYANOCOBALAMIN 1000 MCG/ML IJ SOLN
1000.0000 ug | Freq: Once | INTRAMUSCULAR | Status: AC
  Administered 2020-01-03: 1000 ug via INTRAMUSCULAR

## 2020-01-03 MED ORDER — TRAZODONE HCL 50 MG PO TABS: 50.0000 mg | ORAL_TABLET | Freq: Every evening | ORAL | 2 refills | Status: DC | PRN

## 2020-01-03 MED ORDER — APIXABAN 5 MG PO TABS: 5.0000 mg | ORAL_TABLET | Freq: Two times a day (BID) | ORAL | 5 refills | Status: DC

## 2020-01-03 NOTE — Telephone Encounter (Signed)
Medication Refill - Medication: lisinopril (ZESTRIL) 10 MG tablet   Patient states he is all out of medication   Preferred Pharmacy (with phone number or street name):  [facility] [place], [address] Phone:  [phone]  Fax:  [phone]       Agent: Please be advised that RX refills may take up to 3 business days. We ask that you follow-up with your pharmacy.

## 2020-01-03 NOTE — Telephone Encounter (Signed)
Medication Refill - Medication: Duoneb   Has the patient contacted their pharmacy? Yes.   (Agent: If no, request that the patient contact the pharmacy for the refill.) (Agent: If yes, when and what did the pharmacy advise?)  Preferred Pharmacy (with phone number or street name):  [facility] [place], [address] [place]  [place] [address]  Phone: [phone] Fax: [phone]  Hours: Not open 24 hours    Agent: Please be advised that RX refills may take up to 3 business days. We ask that you follow-up with your pharmacy.

## 2020-01-03 NOTE — Telephone Encounter (Signed)
Spoke with pharmacy about patient request for duo neb.  They have refills and will contact patient. She is concerned because patient is confused. She is sending request for lisinopril 10 mg which is the rx they need.
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
## 2020-01-03 NOTE — Patient Instructions (Signed)
Take Trazodone just before going to bed every night  Get refills from the pharmacy for your nebulizer machine  Continue Lisinopril once a day for blood pressure  Take Eliquis 5 mg twice a day (morning and evening)

## 2020-01-04 ENCOUNTER — Other Ambulatory Visit: Payer: Self-pay

## 2020-01-04 ENCOUNTER — Telehealth: Payer: Self-pay

## 2020-01-04 DIAGNOSIS — I10 Essential (primary) hypertension: Secondary | ICD-10-CM

## 2020-01-04 MED ORDER — LISINOPRIL 10 MG PO TABS: 10.0000 mg | ORAL_TABLET | Freq: Every day | ORAL | 1 refills | Status: DC

## 2020-01-04 NOTE — Telephone Encounter (Signed)
Called pt could not leave VM. VM was not set up. Phone did not ring. If patient calls back let pt know to prop his feet up.  Will route result note to [facility] Nurse Triage for follow up when patient returns call to clinic. CRM created.  KP

## 2020-01-04 NOTE — Telephone Encounter (Signed)
Attempted to contact patient but was unable to leave message VM not set up.

## 2020-01-04 NOTE — Telephone Encounter (Signed)
Lisinopril was sent to pharmacy 01/04/2020.  KP

## 2020-01-04 NOTE — Telephone Encounter (Signed)
Copied from [place] [id number]. Topic: General - Call Back - No Documentation >> Jan 04, 2020 10:44 AM Erick Blinks wrote: Reason for CRM: Pt called back to report that he forgot to tell his PCP that his feet are swollen. Shared with triage, they said it was okay to send message since he was just seen yesterday.  Best contact: [phone]

## 2020-01-05 NOTE — Telephone Encounter (Signed)
TC to patient. Discussed elevating his lower legs and feet several times daily to reduce swelling. Denies SOB today. Patient began asking about his medication, Eliquis if had refills at the pharmacy. Per MAR it was ordered on 01/03/20 #60 tabs and 5 refills. He confirmed is taking the medication.

## 2020-01-06 ENCOUNTER — Telehealth: Payer: Self-pay

## 2020-01-06 NOTE — Telephone Encounter (Unsigned)
Copied from [place] [id number]. Topic: General - Other >> Jan 06, 2020 11:02 AM Rainey Pines A wrote: Patient would like to speak with nurse today in regards to what medications he should be taking. Patient feels confused about what he should take and wants to be clear on what to take.

## 2020-01-07 DIAGNOSIS — R0602 Shortness of breath: Secondary | ICD-10-CM | POA: Diagnosis not present

## 2020-01-07 DIAGNOSIS — I4891 Unspecified atrial fibrillation: Secondary | ICD-10-CM | POA: Diagnosis not present

## 2020-01-07 DIAGNOSIS — R110 Nausea: Secondary | ICD-10-CM | POA: Diagnosis not present

## 2020-01-07 DIAGNOSIS — R000 Tachycardia, unspecified: Secondary | ICD-10-CM | POA: Diagnosis not present

## 2020-01-07 DIAGNOSIS — R0902 Hypoxemia: Secondary | ICD-10-CM | POA: Diagnosis not present

## 2020-01-07 NOTE — Telephone Encounter (Signed)
Called pt could not leave VM. VM was not set up. Please inform pt of his medications if he calls back.  Will route result note to [facility] Nurse Triage for follow up when patient returns call to clinic. CRM created.  KP

## 2020-01-14 ENCOUNTER — Telehealth: Payer: Self-pay

## 2020-01-14 NOTE — Telephone Encounter (Signed)
Brady Smith came in asking why is it someone canceling his appointments because he's complaining about having blood clots, he seems pretty anxious about the whole thing and also he may seem confused.

## 2020-01-17 NOTE — Telephone Encounter (Signed)
Noted - patient is scheduled for a visit tomorrow with Dr [name]. We plan to see him and test is 6CIT to make sure he is not having any memory issues. This is different behavior from him than we are used to.  CM

## 2020-01-18 ENCOUNTER — Encounter: Payer: Self-pay | Admitting: Internal Medicine

## 2020-01-18 ENCOUNTER — Other Ambulatory Visit: Payer: Self-pay

## 2020-01-18 ENCOUNTER — Ambulatory Visit (INDEPENDENT_AMBULATORY_CARE_PROVIDER_SITE_OTHER): Payer: Medicare Other | Admitting: Internal Medicine

## 2020-01-18 VITALS — BP 94/62 | HR 78 | Temp 98.2°F | Ht 67.0 in | Wt 147.0 lb

## 2020-01-18 DIAGNOSIS — F5101 Primary insomnia: Secondary | ICD-10-CM | POA: Diagnosis not present

## 2020-01-18 DIAGNOSIS — R1013 Epigastric pain: Secondary | ICD-10-CM

## 2020-01-18 DIAGNOSIS — I2699 Other pulmonary embolism without acute cor pulmonale: Secondary | ICD-10-CM

## 2020-01-18 MED ORDER — PANTOPRAZOLE SODIUM 40 MG PO TBEC: 40.0000 mg | DELAYED_RELEASE_TABLET | Freq: Every day | ORAL | 3 refills | Status: DC
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
## 2020-01-19 ENCOUNTER — Telehealth: Payer: Self-pay

## 2020-01-19 DIAGNOSIS — R1013 Epigastric pain: Secondary | ICD-10-CM | POA: Diagnosis not present

## 2020-01-19 NOTE — Chronic Care Management (AMB) (Signed)
  Chronic Care Management   Outreach Note  01/19/2020 Name: Brady Smith MRN: [id number] DOB: 1945/10/14  [name] is a 74 y.o. year old male who is a primary care patient of Glean Hess, MD. I reached out to [name] by phone today in response to a referral sent by [name], Dr. [name]      An unsuccessful telephone outreach was attempted today. The patient was referred to the case management team for assistance with care management and care coordination.   Follow Up Plan: The care management team will reach out to the patient again over the next 7 days.  If patient returns call to provider office, please advise to call [facility]  at [place], [place], [place], [place] [address] Direct Dial: [phone] [email or web address]: [email or web address]

## 2020-01-20 ENCOUNTER — Telehealth: Payer: Self-pay

## 2020-01-20 LAB — H. PYLORI BREATH TEST: H pylori Breath Test: NEGATIVE

## 2020-01-20 NOTE — Telephone Encounter (Signed)
Called pt told him medication was ready to pick up. Pt has picked up meds.  KP

## 2020-01-20 NOTE — Telephone Encounter (Signed)
Copied from [place] [id number]. Topic: General - Inquiry >> Jan 20, 2020  9:03 AM Scherrie Gerlach wrote: Reason for CRM: pt called this am stating the pharmacy is giving him the run around and doen't carry his medication. I called the pharmacy, they said it is ready for pick up. I think pt is confused. He states this is a specialty medication. Pt states he has been to several pharmacies and is getting the run around.

## 2020-01-26 ENCOUNTER — Telehealth: Payer: Self-pay

## 2020-01-26 NOTE — Progress Notes (Signed)
error 

## 2020-01-26 NOTE — Chronic Care Management (AMB) (Signed)
  Chronic Care Management   Note  01/26/2020 Name: [name] MRN: [id number] DOB: 05-May-1945  [name] is a 74 y.o. year old male who is a primary care patient of [name] Jesse Sans, MD. I reached out to [name] by phone today in response to a referral sent by Mr. Ellender Hose PCP, Dr. [name]      Mr. [place]iston was given information about Chronic Care Management services today including:  1. CCM service includes personalized support from designated clinical staff supervised by his physician, including individualized plan of care and coordination with other care providers 2. 24/7 contact phone numbers for assistance for urgent and routine care needs. 3. Service will only be billed when office clinical staff spend 20 minutes or more in a month to coordinate care. 4. Only one practitioner may furnish and bill the service in a calendar month. 5. The patient may stop CCM services at any time (effective at the end of the month) by phone call to the office staff. 6. The patient will be responsible for cost sharing (co-pay) of up to 20% of the service fee (after annual deductible is met).  Patient agreed to services and verbal consent obtained.   Follow up plan: Telephone appointment with care management team member scheduled for:01/27/2020  Noreene Larsson, [place], [place], [place] [address] Direct Dial: [phone] [email or web address] Website: [email or web address]

## 2020-01-27 ENCOUNTER — Ambulatory Visit: Payer: Medicare Other

## 2020-01-27 DIAGNOSIS — J441 Chronic obstructive pulmonary disease with (acute) exacerbation: Secondary | ICD-10-CM | POA: Diagnosis not present

## 2020-02-03 ENCOUNTER — Ambulatory Visit: Payer: Medicare Other | Admitting: Pharmacist

## 2020-02-03 ENCOUNTER — Other Ambulatory Visit: Payer: Self-pay

## 2020-02-03 DIAGNOSIS — I480 Paroxysmal atrial fibrillation: Secondary | ICD-10-CM

## 2020-02-03 DIAGNOSIS — I10 Essential (primary) hypertension: Secondary | ICD-10-CM

## 2020-02-03 DIAGNOSIS — I2699 Other pulmonary embolism without acute cor pulmonale: Secondary | ICD-10-CM
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
## 2020-02-08 ENCOUNTER — Ambulatory Visit: Payer: Self-pay | Admitting: Pharmacist

## 2020-02-10 NOTE — Patient Instructions (Signed)
Visit Information  It was a pleasure speaking with you today! Thank you for letting me be a part of your care team. Please call with any questions or concerns.  Goals Addressed            This Visit's Progress   . Pharmacy Care Plan       CARE PLAN ENTRY (see longitudinal plan of care for additional care plan information)  Current Barriers:  . Chronic Disease Management support, education, and care coordination needs related to Hypertension, Atrial Fibrillation recent bilat PE, RLE DVT GERD, COPD, Tobacco use, and PVD. Financial barriers to Eliquis therapy.   Hypertension BP Readings from Last 3 Encounters:  01/18/20 94/62  01/03/20 102/68  12/28/19 125/73   . Pharmacist Clinical Goal(s): o Over the next 60 days, patient will work with PharmD and providers to achieve BP goal <140/90 . Current regimen:  o Lisinopril 10 mg qd . Interventions: o Reviewed fill history in computer o Educated patient on the importance of taking as prescribed o Encouraged patient to consider SMBP . Patient self care activities - Over the next 60 days, patient will: o Check BP at appointments document, and provide at future appointments o Ensure daily salt intake < 2300 mg/day  Afib/ Bilateral PE/RLE DVT . Pharmacist Clinical Goal(s) o Over the next 60 days, patient will work with PharmD and providers to eliminate financial barriers and maximize adherence. . Current regimen:  o Apixaban 5 mg bid . Interventions: o Reviewed fill history and assessed compliance  o Provided education on signs/symptoms of bleeding o Will assist patient is applying for Medicare LIS and Eliquis PAP. Marland Kitchen Patient self care activities - Over the next 60 days, patient will: o Take medication as prescribed o Provide required portion of patient assistance documentation  Medication management . Pharmacist Clinical Goal(s): o Over the next 60 days, patient will work with PharmD and providers to achieve optimal medication  adherence . Current pharmacy: [facility] . Interventions o Comprehensive medication review performed. o Continue current medication management strategy . Patient self care activities - Over the next 60 days, patient will: o Focus on medication adherence by fill dates o Take medications as prescribed o Report any questions or concerns to PharmD and/or provider(s)  Initial goal documentation        Mr. Sanor was given information about Chronic Care Management services today including:  1. CCM service includes personalized support from designated clinical staff supervised by his physician, including individualized plan of care and coordination with other care providers 2. 24/7 contact phone numbers for assistance for urgent and routine care needs. 3. Standard insurance, coinsurance, copays and deductibles apply for chronic care management only during months in which we provide at least 20 minutes of these services. Most insurances cover these services at 100%, however patients may be responsible for any copay, coinsurance and/or deductible if applicable. This service may help you avoid the need for more expensive face-to-face services. 4. Only one practitioner may furnish and bill the service in a calendar month. 5. The patient may stop CCM services at any time (effective at the end of the month) by phone call to the office staff.  Patient agreed to services and verbal consent obtained.   The patient verbalized understanding of instructions, educational materials, and care plan provided today and declined offer to receive copy of patient instructions, educational materials, and care plan.  Telephone follow up appointment with pharmacy team member scheduled for: 2 months  Junita Push. Kenton Kingfisher  PharmD, [place] Clinical Pharmacist [phone]

## 2020-02-10 NOTE — Progress Notes (Signed)
Chronic Care Management   Note  02/10/2020 Name: Brady Smith MRN: [id number] DOB: 03-Sep-1945  Reason for referral: CCM with financial difficulty identified  Referral source: High Risk  Referral medication(s): Eliquis Current insurance:[employer] Medicare  PMHx: PVD, HTN, Afib, COPD, Tobacco abuse, alcohol abuse, dyspepsia   HPI: Patient presented to [facility] 12/23/19 for SOB. Found to have bilateral PE and RLE DVT. Pulmonary Thrombolysis performed on 10/01. Patient discharged on Eliquis 10/04. Patient given sample pack at PCP appt 10/11. Patient presented for initial consult with PharmD on 02/07/20 with c/o copayment strain. He did pick up Eliquis and paid $279.00. Patient asked for help calling SS admin, pharmacy and completing PAP.   Objective: Allergies  Allergen Reactions  . Influenza Vaccines     High blood pressure- had to be admitted  . Lipitor [Atorvastatin Calcium]     Muscle pain    Medications Reviewed Today    Reviewed by Vladimir Faster, [facility] (Pharmacist) on 02/10/20 at 1137  Med List Status: <None>  Medication Order Taking? Sig Documenting Provider Last Dose Status Informant  albuterol (VENTOLIN HFA) 108 (90 Base) MCG/ACT inhaler [id number]  Inhale 2 puffs into the lungs every 6 (six) hours as needed for wheezing or shortness of breath. Deatra James, MD  Expired 01/11/20 2359 Family Member  apixaban (ELIQUIS) 5 MG TABS tablet [id number] Yes Take 1 tablet (5 mg total) by mouth 2 (two) times daily. Glean Hess, MD Taking Active   ipratropium-albuterol (DUONEB) 0.5-2.5 (3) MG/3ML SOLN [id number] No Take 3 mLs by nebulization 3 (three) times daily.  Patient not taking: Reported on 02/03/2020   Glean Hess, MD Not Taking Active Family Member  lisinopril (ZESTRIL) 10 MG tablet [id number] Yes Take 1 tablet (10 mg total) by mouth daily. Glean Hess, MD Taking Active   mometasone-formoterol [facility]) 100-5 MCG/ACT Hollie Salk [id number] No Inhale 2 puffs into the lungs 2  (two) times daily.  Patient not taking: Reported on 02/03/2020   Enzo Bi, MD Not Taking Active   pantoprazole (PROTONIX) 40 MG tablet [id number] Yes Take 1 tablet (40 mg total) by mouth daily. Glean Hess, MD Taking Active   traZODone (DESYREL) 50 MG tablet [id number] Yes Take 1 tablet (50 mg total) by mouth at bedtime as needed for sleep. Glean Hess, MD Taking Active   Trolamine Salicylate 10 % AERO [id number]  Apply topically as needed. [provider]  Active           Assessment: Financial strain relating to Eliquis.   Medication Assistance Findings:  Extra Help:   []  Already receiving Full Extra Help  []  Already receiving Partial Extra Help  [x]  Eligible based on reported income and assets  []  Not Eligible based on reported income and assets  Patient Assistance Programs: 1) Eliquis made by BMS o Income requirement met: [x]  Yes []  No []  Unknown o Out-of-pocket prescription expenditure met:    [x]  Yes []  No  []  Unknown  []  Not applicable  Additional medication assistance options reviewed with patient as warranted:  Tier Exception request  Goals Addressed            This Visit's Progress   . "I can't pay for this medicine"       CARE PLAN ENTRY (see longitudinal plan of care for additional care plan information)  Current Barriers:  . Financial Barriers in complicated patient with multiple medical conditions including Afib with acure bilateral PE and RLE DVT; patient has [facility]  and reports copay for Eliquis  is cost prohibitive at this time . Patient can not provide SSI statement and needs help obtaining documentation  Pharmacist Clinical Goal(s):  Marland Kitchen Over the next 30 days, patient will work with PharmD and providers to relieve medication access concerns  Interventions . Attempted to call SS admin with patient .  Marland Kitchen Helped patient create email account for online registration. . Based on patient reported income, Patient meets income  out of pocket spend criteria for this medication's patient assistance program. Reviewed application process. Patient needs assistance obtaining proof of income, out of pocket spend report, and will sign application. Will collaborate with primary care provider for their portion of application. Once completed, will submit to patient assistance program . Inter-disciplinary care team collaboration (see longitudinal plan of care)  Patient Self Care Activities:  . Patient will provide necessary portions of application   Initial goal documentation         Plan: Patient is to provide additional documents as necessary.   Follow up:  2 weeks  Junita Push. Kenton Kingfisher PharmD, [facility] [phone]

## 2020-02-14 ENCOUNTER — Telehealth: Payer: Self-pay | Admitting: Pharmacist

## 2020-02-14 ENCOUNTER — Ambulatory Visit: Payer: Medicare Other | Admitting: Family Medicine

## 2020-02-14 NOTE — Progress Notes (Addendum)
Open in error  Amanda Caudle, LPN Clinical Pharmacist Assistant  [phone]   

## 2020-02-14 NOTE — Progress Notes (Signed)
Chronic Care Management Pharmacy Assistant   Name: Brady Smith  MRN: [id number] DOB: 04/30/1945  Reason for Encounter: Patient Assistance   PCP : Glean Hess, MD  Allergies:   Allergies  Allergen Reactions  . Influenza Vaccines     High blood pressure- had to be admitted  . Lipitor [Atorvastatin Calcium]     Muscle pain    Medications: Outpatient Encounter Medications as of 02/14/2020  Medication Sig  . albuterol (VENTOLIN HFA) 108 (90 Base) MCG/ACT inhaler Inhale 2 puffs into the lungs every 6 (six) hours as needed for wheezing or shortness of breath.  Marland Kitchen apixaban (ELIQUIS) 5 MG TABS tablet Take 1 tablet (5 mg total) by mouth 2 (two) times daily.  Marland Kitchen ipratropium-albuterol (DUONEB) 0.5-2.5 (3) MG/3ML SOLN Take 3 mLs by nebulization 3 (three) times daily. (Patient not taking: Reported on 02/03/2020)  . lisinopril (ZESTRIL) 10 MG tablet Take 1 tablet (10 mg total) by mouth daily.  . mometasone-formoterol (DULERA) 100-5 MCG/ACT AERO Inhale 2 puffs into the lungs 2 (two) times daily. (Patient not taking: Reported on 02/03/2020)  . pantoprazole (PROTONIX) 40 MG tablet Take 1 tablet (40 mg total) by mouth daily.  . traZODone (DESYREL) 50 MG tablet Take 1 tablet (50 mg total) by mouth at bedtime as needed for sleep.  Loura Pardon Salicylate 10 % AERO Apply topically as needed.   No facility-administered encounter medications on file as of 02/14/2020.    Current Diagnosis: Patient Active Problem List   Diagnosis Date Noted  . Dyspepsia 01/18/2020  . Primary insomnia 01/18/2020  . COPD with acute exacerbation ([place]) 12/23/2019  . Pulmonary embolism ([place]) 12/23/2019  . Tobacco abuse 12/04/2019  . Dermatitis 11/24/2019  . Senile ecchymosis 08/02/2019  . B12 nutritional deficiency 06/07/2019  . Memory changes 04/24/2018  . Excoriation of scalp 04/24/2018  . Paroxysmal atrial fibrillation ([place]) 06/13/2017  . Elevated blood sugar 02/03/2017  . S/P amputation of lesser toe  ([place]) 07/25/2015  . Peripheral vascular disease ([place]) 07/14/2015  . Toxic metabolic encephalopathy 88/89/1694  . AA (alcohol abuse) 05/23/2015  . SBO (small bowel obstruction) ([place]) 05/18/2015  . Urinary retention 05/18/2015  . Essential hypertension 05/18/2015  . Choroidal nevus, right eye 02/08/2015  . Carpal tunnel syndrome 01/13/2015  . H/O gastrointestinal disease 01/13/2015  . History of primary malignant neoplasm of urinary bladder 01/13/2015  . Compulsive tobacco user syndrome 01/13/2015  . Nicotine addiction 01/13/2015  . HLD (hyperlipidemia) 01/13/2015  . Neurosis, phobic 01/13/2015  . Lung nodule, solitary 06/11/2011    02-14-2020. The patient reported to Birdena Crandall, [facility] during their last chronic care visit on 02-08-20 that he was unable to afford his medications. The patient requested help related to all of his medications and Eliquis. Explained he was not good with "technology" or being able to "understand forms or directions." Attempted to call patient to assist him in completing the online application for low-income subsidy for medicare, but the patient did not answer and per recording his voicemail was not set up. Prefilled the patient assistance application for Eliquis. The patient will need to stop by the office to provide proof of income and sign the application. Application and needed documentation was completed and printed out by Birdena Crandall, [facility]. Will continue to attempt to contact the patient to complete his low-income subsidy application and explain to him what is needed to complete his Eliquis patient assistance application.   02-15-20: Second attempt to contact patient-related low-income subsidy. No answer or way  to leave voicemail due to "voicemail not being set up" per recording. Will continue to try and make contact with the patient. Birdena Crandall, [facility] made aware.  02-16-20: Third attempt to contact the patient. No answer or way to leave voicemail due to  "voicemail not being set up". Birdena Crandall, [facility] made aware.   Cloretta Ned, LPN Clinical Pharmacist Assistant  [phone]    Follow-Up:  Patient Assistance Coordination

## 2020-02-14 NOTE — Progress Notes (Signed)
  Open in error.   Cloretta Ned, LPN Clinical Pharmacist Assistant  [phone]

## 2020-02-26 DIAGNOSIS — J441 Chronic obstructive pulmonary disease with (acute) exacerbation: Secondary | ICD-10-CM | POA: Diagnosis not present

## 2020-03-02 ENCOUNTER — Other Ambulatory Visit: Payer: Self-pay | Admitting: Internal Medicine

## 2020-03-02 DIAGNOSIS — I2699 Other pulmonary embolism without acute cor pulmonale: Secondary | ICD-10-CM

## 2020-03-02 MED ORDER — APIXABAN 5 MG PO TABS: 5.0000 mg | ORAL_TABLET | Freq: Two times a day (BID) | ORAL | 4 refills | Status: DC

## 2020-03-06 ENCOUNTER — Ambulatory Visit: Payer: Self-pay | Admitting: *Deleted

## 2020-03-06 NOTE — Telephone Encounter (Signed)
Patient calling in with complaints of having a low blood pressure on yesterday and feeling like he was going to pass out. Patient states that SBP was 98, DP and pulse reading not given. Patient states that he does not feel like he is going to pass out currently. Patient states he does not have a way to take BP while on the phone with Triage Nurse. Patient states he was seen yesterday at a "walk in ambulance station". Unable to clearly obtain information from the patient as to where he received care. Patient states that he takes medication for BP but needs education on all his medications and how he is supposed to take them. Attempted to go over medications and their indications on the phone but patient states the labels on the medication bottles can not be read. Patient states the pharmacy printed labels but he is still not able to read them. Patient advised to seek treatment in the ED if he felt as if he was going to pass out again. Understanding verbalized. Patient offered office appt for today but states he was not able to come in because he had things to do. Patient scheduled for appt on 03/07/20.  Reason for Disposition  [6] Systolic BP 86-168 AND [3] taking blood pressure medications AND [3] NOT dizzy, lightheaded or weak  Answer Assessment - Initial Assessment Questions 1. BLOOD PRESSURE: "What is the blood pressure?" "Did you take at least two measurements 5 minutes apart?"     No blood pressure reading taken today, patient states he was unable to take BP today 2. ONSET: "When did you take your blood pressure?"     Yesterday evening at "walk in ambulance station" 3. HOW: "How did you obtain the blood pressure?" (e.g., visiting nurse, automatic home BP monitor)     "Walk in ambulance station" 4. HISTORY: "Do you have a history of low blood pressure?" "What is your blood pressure normally?"     Unable to obtain information from patient 5. MEDICATIONS: "Are you taking any medications for blood  pressure?" If Yes, ask: "Have they been changed recently?"     Yes, patient unclear on medications and how he should be taking them 6. PULSE RATE: "Do you know what your pulse rate is?"      no 7. OTHER SYMPTOMS: "Have you been sick recently?" "Have you had a recent injury?"     Not assessed 8. PREGNANCY: "Is there any chance you are pregnant?" "When was your last menstrual period?"     n/a  Protocols used: BLOOD PRESSURE - LOW-A-AH

## 2020-03-06 NOTE — Telephone Encounter (Signed)
Noted   Pt has appt for low blood pressure.  KP

## 2020-03-07 ENCOUNTER — Other Ambulatory Visit: Payer: Self-pay

## 2020-03-07 ENCOUNTER — Ambulatory Visit (INDEPENDENT_AMBULATORY_CARE_PROVIDER_SITE_OTHER): Payer: Medicare Other | Admitting: Internal Medicine

## 2020-03-07 ENCOUNTER — Encounter: Payer: Self-pay | Admitting: Internal Medicine

## 2020-03-07 VITALS — BP 100/60 | HR 80 | Temp 97.6°F | Ht 67.0 in | Wt 154.0 lb

## 2020-03-07 DIAGNOSIS — J449 Chronic obstructive pulmonary disease, unspecified: Secondary | ICD-10-CM | POA: Diagnosis not present

## 2020-03-07 DIAGNOSIS — I10 Essential (primary) hypertension: Secondary | ICD-10-CM

## 2020-03-07 DIAGNOSIS — I2699 Other pulmonary embolism without acute cor pulmonale: Secondary | ICD-10-CM

## 2020-03-07 DIAGNOSIS — I480 Paroxysmal atrial fibrillation: Secondary | ICD-10-CM | POA: Diagnosis not present

## 2020-03-07 DIAGNOSIS — E538 Deficiency of other specified B group vitamins: Secondary | ICD-10-CM

## 2020-03-07 MED ORDER — CYANOCOBALAMIN 1000 MCG/ML IJ SOLN
1000.0000 ug | Freq: Once | INTRAMUSCULAR | Status: AC
  Administered 2020-03-07: 1000 ug via INTRAMUSCULAR

## 2020-03-07 NOTE — Progress Notes (Signed)
Date:  03/07/2020   Name:  Brady Smith   DOB:  Feb 26, 1946   MRN:  [id number]   Chief Complaint: Hypotension  Dizziness This is a new problem. The current episode started yesterday. The problem has been resolved (went to fire station - BP sys 100). Associated symptoms include coughing. Pertinent negatives include no abdominal pain, chest pain, chills, fatigue or headaches. The symptoms are aggravated by standing (occured when he tried to get out of bed).  He is concerned about his BP being low.  Per his last hospital stay, his bp meds were held. Lisinopril is on his list, However, he is not taking any medication besides Eliquis.  He says that he can't make out the labels and doesn't know what they are for so he has not taken any in some time - he can not be more specific. Pulmonary embolism - started Eliquis in September at hospital discharge.  He denies bleeding issues.  The plan was to take it for three months.  However, he also has Afib on his problem list so it is unclear the duration of therapy.  He was referred to Cardiology but cancelled the appointment because he does not think there is anything wrong with his heart.  Lab Results  Component Value Date   CREATININE 0.91 12/27/2019   BUN 10 12/27/2019   NA 141 12/27/2019   K 3.8 12/27/2019   CL 104 12/27/2019   CO2 29 12/27/2019   Lab Results  Component Value Date   CHOL 159 08/02/2019   HDL 43 08/02/2019   LDLCALC 87 08/02/2019   TRIG 165 (H) 08/02/2019   CHOLHDL 3.7 08/02/2019   Lab Results  Component Value Date   TSH 1.100 08/02/2019   Lab Results  Component Value Date   HGBA1C 5.3 06/13/2017   Lab Results  Component Value Date   WBC 5.3 12/27/2019   HGB 10.6 (L) 12/27/2019   HCT 33.0 (L) 12/27/2019   MCV 95.9 12/27/2019   PLT 145 (L) 12/27/2019   Lab Results  Component Value Date   ALT 31 12/23/2019   AST 31 12/23/2019   ALKPHOS 73 12/23/2019   BILITOT 1.1 12/23/2019     Review of Systems   Constitutional: Negative for chills, fatigue and unexpected weight change.  Respiratory: Positive for cough, chest tightness, shortness of breath and wheezing.   Cardiovascular: Negative for chest pain, palpitations and leg swelling.  Gastrointestinal: Positive for abdominal distention. Negative for abdominal pain.  Genitourinary: Negative for difficulty urinating.  Neurological: Negative for dizziness, light-headedness and headaches.  Psychiatric/Behavioral: Negative for dysphoric mood and sleep disturbance. The patient is not nervous/anxious.     Patient Active Problem List   Diagnosis Date Noted  . Dyspepsia 01/18/2020  . Primary insomnia 01/18/2020  . COPD with acute exacerbation ([place]) 12/23/2019  . Pulmonary embolism ([place]) 12/23/2019  . Tobacco abuse 12/04/2019  . Dermatitis 11/24/2019  . Senile ecchymosis 08/02/2019  . B12 nutritional deficiency 06/07/2019  . Memory changes 04/24/2018  . Excoriation of scalp 04/24/2018  . Paroxysmal atrial fibrillation ([place]) 06/13/2017  . Elevated blood sugar 02/03/2017  . S/P amputation of lesser toe ([place]) 07/25/2015  . Peripheral vascular disease ([place]) 07/14/2015  . Toxic metabolic encephalopathy 38/75/6433  . AA (alcohol abuse) 05/23/2015  . SBO (small bowel obstruction) ([place]) 05/18/2015  . Urinary retention 05/18/2015  . Essential hypertension 05/18/2015  . Choroidal nevus, right eye 02/08/2015  . Chronic obstructive pulmonary disease ([place]) 02/08/2015  . Carpal tunnel  syndrome 01/13/2015  . H/O gastrointestinal disease 01/13/2015  . History of primary malignant neoplasm of urinary bladder 01/13/2015  . Compulsive tobacco user syndrome 01/13/2015  . Nicotine addiction 01/13/2015  . HLD (hyperlipidemia) 01/13/2015  . Neurosis, phobic 01/13/2015  . Lung nodule, solitary 06/11/2011    Allergies  Allergen Reactions  . Influenza Vaccines     High blood pressure- had to be admitted  . Lipitor [Atorvastatin Calcium]     Muscle pain     Past Surgical History:  Procedure Laterality Date  . AMPUTATION Right 07/19/2015   Procedure: AMPUTATION DIGIT ( RIGHT FOOT FIFTH TOE );  Surgeon: Algernon Huxley, MD;  Location: [facility];  Service: Vascular;  Laterality: Right;  . BOWEL RESECTION  05/20/2015   Procedure: SMALL BOWEL RESECTION;  Surgeon: Clayburn Pert, MD;  Location: [facility];  Service: General;;  . CATARACT EXTRACTION W/PHACO Left 10/06/2018   Procedure: CATARACT EXTRACTION PHACO AND INTRAOCULAR LENS PLACEMENT ([place])  LEFT;  Surgeon: Birder Robson, MD;  Location: [place];  Service: Ophthalmology;  Laterality: Left;  DAY OF TEST. LIVES IN BOARDING HOUSE  . CATARACT EXTRACTION W/PHACO Right 10/27/2018   Procedure: CATARACT EXTRACTION PHACO AND INTRAOCULAR LENS PLACEMENT ([place])  RIGHT;  Surgeon: Birder Robson, MD;  Location: [place];  Service: Ophthalmology;  Laterality: Right;  PT HAS TO HAVE COVID TEST MORNING OF LEAVE AT LAST PATIENT  . CHOLECYSTECTOMY    . COLONOSCOPY  2000  . CYSTECTOMY W/ CONTINENT DIVERSION  1996  . HERNIA REPAIR    . LAPAROTOMY N/A 05/20/2015   Procedure: EXPLORATORY LAPAROTOMY;  Surgeon: Clayburn Pert, MD;  Location: [facility];  Service: General;  Laterality: N/A;  . PERIPHERAL VASCULAR CATHETERIZATION N/A 07/03/2015   Procedure: Abdominal Aortogram w/Lower Extremity;  Surgeon: Algernon Huxley, MD;  Location: [place] [facility];  Service: Cardiovascular;  Laterality: N/A;  . PERIPHERAL VASCULAR CATHETERIZATION  07/03/2015   Procedure: Lower Extremity Intervention;  Surgeon: Algernon Huxley, MD;  Location: [facility] [place] [facility];  Service: Cardiovascular;;  . PROSTATECTOMY  1996  . PULMONARY THROMBECTOMY N/A 12/24/2019   Procedure: PULMONARY THROMBECTOMY / THROMBOLYSIS;  Surgeon: Katha Cabal, MD;  Location: [facility];  Service: Cardiovascular;  Laterality: N/A;  . TONSILLECTOMY      Social History   Tobacco Use  . Smoking status: Current Every Day Smoker     Packs/day: 0.25    Years: 65.00    Pack years: 16.25    Types: Cigarettes  . Smokeless tobacco: Never Used  . Tobacco comment: reduced # of packs smoked to 5-6 ciggs daily from 3 pks daily   Vaping Use  . Vaping Use: Never used  Substance Use Topics  . Alcohol use: Yes    Alcohol/week: 12.0 standard drinks    Types: 12 Cans of beer per week  . Drug use: No     Medication list has been reviewed and updated.  Current Meds  Medication Sig  . albuterol (VENTOLIN HFA) 108 (90 Base) MCG/ACT inhaler Inhale 2 puffs into the lungs every 6 (six) hours as needed for wheezing or shortness of breath.  Marland Kitchen apixaban (ELIQUIS) 5 MG TABS tablet Take 1 tablet (5 mg total) by mouth 2 (two) times daily.    PHQ 2/9 Scores 03/07/2020 01/18/2020 01/03/2020 06/07/2019  PHQ - 2 Score 0 1 4 4   PHQ- 9 Score 0 1 12 5     GAD 7 : Generalized Anxiety Score 03/07/2020 01/18/2020 01/03/2020  Nervous, Anxious,  on Edge 0 0 0  Control/stop worrying 0 1 0  Worry too much - different things 0 1 0  Trouble relaxing 0 0 0  Restless 0 0 0  Easily annoyed or irritable 0 0 0  Afraid - awful might happen 0 0 0  Total GAD 7 Score 0 2 0  Anxiety Difficulty - Not difficult at all Not difficult at all    BP Readings from Last 3 Encounters:  03/07/20 100/60  01/18/20 94/62  01/03/20 102/68    Physical Exam Vitals and nursing note reviewed.  Constitutional:      General: He is not in acute distress.    Appearance: He is well-developed.  HENT:     Head: Normocephalic and atraumatic.  Cardiovascular:     Rate and Rhythm: Normal rate and regular rhythm.     Pulses: Normal pulses.     Heart sounds: No murmur heard.   Pulmonary:     Effort: Pulmonary effort is normal. No respiratory distress.     Breath sounds: Decreased breath sounds present.  Musculoskeletal:        General: Normal range of motion.     Cervical back: Normal range of motion.  Lymphadenopathy:     Cervical: No cervical adenopathy.  Skin:     General: Skin is warm and dry.     Findings: No rash.  Neurological:     Mental Status: He is alert and oriented to person, place, and time.  Psychiatric:        Attention and Perception: Attention normal.        Mood and Affect: Mood and affect and mood normal.     Wt Readings from Last 3 Encounters:  03/07/20 154 lb (69.9 kg)  01/18/20 147 lb (66.7 kg)  01/03/20 149 lb (67.6 kg)    BP 100/60   Pulse 80   Temp 97.6 F (36.4 C) (Oral)   Ht 5\' 7"  (1.702 m)   Wt 154 lb (69.9 kg)   BMI 24.12 kg/m   Assessment and Plan: 1. Essential hypertension Not requiring any medication at this time Med list reviewed with patient  2. Acute pulmonary embolism without acute cor pulmonale, unspecified pulmonary embolism type (HCC) Continue Eliquis for 6 months - he can stop the medication at the end of March  3. Paroxysmal atrial fibrillation (HCC) Not clear when this was noted - not a problem during last hospital stay Pt declined cardiology consult - I may be able to have him continue Eliquis indefinitely if he can afford it  4. Chronic obstructive pulmonary disease, unspecified COPD type ([place]) Pt states he can not use a nebulizer He uses Albuterol MDI PRN He does not get any perceived benefit from [facility]   Partially dictated using [employer]. Any errors are unintentional.  Halina Maidens, MD [facility]  03/07/2020

## 2020-03-14 ENCOUNTER — Ambulatory Visit: Payer: Self-pay | Admitting: Pharmacist

## 2020-03-14 DIAGNOSIS — I10 Essential (primary) hypertension: Secondary | ICD-10-CM

## 2020-03-14 DIAGNOSIS — I480 Paroxysmal atrial fibrillation: Secondary | ICD-10-CM
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
## 2020-03-21 ENCOUNTER — Ambulatory Visit: Payer: Medicare Other | Admitting: Family Medicine

## 2020-03-21 ENCOUNTER — Telehealth: Payer: Self-pay | Admitting: Internal Medicine

## 2020-03-21 NOTE — Telephone Encounter (Signed)
NO ANSWER/NO VM. Pt due to schedule Medicare Annual Wellness Visit (AWV) either virtually/audio only or in office. Whichever the patients preference is.  Last AWV 07/10/17; please schedule at anytime with [facility]-Nurse Health Advisor.  This should be a 40 minute visit. 

## 2020-03-26 NOTE — Patient Instructions (Addendum)
Visit Information  It was a pleasure speaking with you today. Thank you for letting me be part of your clinical team. Please call with any questions or concerns.   Goals Addressed            This Visit's Progress   . Pharmacy Care Plan       CARE PLAN ENTRY (see longitudinal plan of care for additional care plan information)  Current Barriers:  . Chronic Disease Management support, education, and care coordination needs related to Hypertension, Atrial Fibrillation recent bilat PE, RLE DVT GERD, COPD, Tobacco use, and PVD. Financial barriers to Eliquis therapy.   Hypertension BP Readings from Last 3 Encounters:  01/18/20 94/62  01/03/20 102/68  12/28/19 125/73   . Pharmacist Clinical Goal(s): o Over the next 60 days, patient will work with PharmD and providers to achieve BP goal <140/90 . Current regimen:  o None . Interventions: o Provided diet and exercise counseling. o Encouraged patient to consider SMBP . Patient self care activities - Over the next 60 days, patient will: o Check BP at appointments document, and provide at future appointments o Ensure daily salt intake < 2300 mg/day  Afib/ Bilateral PE/RLE DVT . Pharmacist Clinical Goal(s) o Over the next 60 days, patient will work with PharmD and providers to eliminate financial barriers and maximize adherence. . Current regimen:  o Apixaban 5 mg bid . Interventions: o Reviewed fill history and assessed compliance  o Provided education on signs/symptoms of bleeding o Eliquis PAP completed and faxed. Will complete LIS once SS statement received by patient . Patient self care activities - Over the next 60 days, patient will: o Take medication as prescribed o Provide required portion of patient assistance documentation  Medication management . Pharmacist Clinical Goal(s): o Over the next 60 days, patient will work with PharmD and providers to achieve optimal medication adherence . Current pharmacy:  [facility] . Interventions o Comprehensive medication review performed. o Continue current medication management strategy . Patient self care activities - Over the next 60 days, patient will: o Focus on medication adherence by fill dates o Take medications as prescribed o Report any questions or concerns to PharmD and/or provider(s)  Please see past updates related to this goal by clicking on the "Past Updates" button in the selected goal         The patient verbalized understanding of instructions, educational materials, and care plan provided today and agreed to receive a mailed copy of patient instructions, educational materials, and care plan.   Telephone follow up appointment with pharmacy team member scheduled for: 2 months  [place] [facility]. Donta Mcinroy PharmD, BCPS Clinical Pharmacist [phone]  Eating Plan for Chronic Obstructive Pulmonary Disease Chronic obstructive pulmonary disease (COPD) causes symptoms such as shortness of breath, coughing, and chest discomfort. These symptoms can make it difficult to eat enough to maintain a healthy weight. Generally, people with COPD should eat a diet that is high in calories, protein, and other nutrients to maintain body weight and to keep the lungs as healthy as possible. Depending on the medicines you take and other health conditions you may have, your health care provider may give you additional recommendations on what to eat or avoid. Talk with your health care provider about your goals for body weight, and work with a dietitian to develop an eating plan that is right for you. What are tips for following this plan? Reading food labels   Avoid foods with more than 300 milligrams (mg) of salt (  sodium) per serving.  Choose foods that contain at least 4 grams (g) of fiber per serving. Try to eat 20-30 g of fiber each day.  Choose foods that are high in calories and protein, such as nuts, beans, yogurt, and cheese. Shopping  Do not buy foods  labeled as diet, low-calorie, or low-fat.  If you are able to eat dairy products: ? Avoid low-fat or skim milk. ? Buy dairy products that have at least 2% fat.  Buy nutritional supplement drinks.  Buy grains and prepared foods labeled as enriched or fortified.  Consider buying low-sodium, pre-made foods to conserve energy for eating. Cooking  Add dry milk or protein powder to smoothies.  Cook with healthy fats, such as olive oil, canola oil, sunflower oil, and grapeseed oil.  Add oil, butter, cream cheese, or nut butters to foods to increase fat and calories.  To make foods easier to chew and swallow: ? Cook vegetables, pasta, and rice until soft. ? Cut or grind meat into very small pieces. ? Dip breads in liquid. Meal planning   Eat when you feel hungry.  Eat 5-6 small meals throughout the day.  Drink 6-8 glasses of water each day.  Do not drink liquids with meals. Drink liquids at the end of the meal to avoid feeling full too quickly.  Eat a variety of fruits and vegetables every day.  Ask for assistance from family or friends with planning and preparing meals as needed.  Avoid foods that cause you to feel bloated, such as carbonated drinks, fried foods, beans, broccoli, cabbage, and apples.  For older adults, ask your local agency on aging whether you are eligible for meal assistance programs, such as Meals on Wheels. Lifestyle   Do not smoke.  Eat slowly. Take small bites and chew food well before swallowing.  Do not overeat. This may make it more difficult to breathe after eating.  Sit up while eating.  If needed, continue to use supplemental oxygen while eating.  Rest or relax for 30 minutes before and after eating.  Monitor your weight as told by your health care provider.  Exercise as told by your health care provider. What foods can I eat? Fruits All fresh, dried, canned, or frozen fruits that do not cause gas. Vegetables All fresh, canned (no  salt added), or frozen vegetables that do not cause gas. Grains Whole grain bread. Enriched whole grain pasta. Fortified whole grain cereals. Fortified rice. Quinoa. Meats and other proteins Lean meat. Poultry. Fish. Dried beans. Unsalted nuts. Tofu. Eggs. Nut butters. Dairy Whole or 2% milk. Cheese. Yogurt. Fats and oils Olive oil. Canola oil. Butter. Margarine. Beverages Water. Vegetable juice (no salt added). Decaffeinated coffee. Decaffeinated or herbal tea. Seasonings and condiments Fresh or dried herbs. Low-salt or salt-free seasonings. Low-sodium soy sauce. The items listed above may not be a complete list of foods and beverages you can eat. Contact a dietitian for more information. What foods are not recommended? Fruits Fruits that cause gas, such as apples or melon. Vegetables Vegetables that cause gas, such as broccoli, Brussels sprouts, cabbage, cauliflower, and onions. Canned vegetables with added salt. Meats and other proteins Fried meat. Salt-cured meat. Processed meat. Dairy Fat-free or low-fat milk, yogurt, or cheese. Processed cheese. Beverages Carbonated drinks. Caffeinated drinks, such as coffee, tea, and soft drinks. Juice. Alcohol. Vegetable juice with added salt. Seasonings and condiments Salt. Seasoning mixes with salt. Soy sauce. Angie Fava. Other foods Clear soup or broth. Fried foods. Prepared frozen meals. The  items listed above may not be a complete list of foods and beverages you should avoid. Contact a dietitian for more information. Summary  COPD symptoms can make it difficult to eat enough to maintain a healthy weight.  A COPD eating plan can help you maintain your body weight and keep your lungs as healthy as possible.  Eat a diet that is high in calories, protein, and other nutrients. Read labels to make sure that you are getting the right nutrients. Cook foods to make them easy to chew and swallow.  Eat 5-6 small meals throughout the day, and  avoid foods that cause gas or make you feel bloated. This information is not intended to replace advice given to you by your health care provider. Make sure you discuss any questions you have with your health care provider. Document Revised: 07/02/2018 Document Reviewed: 05/27/2017 Elsevier Patient Education  [place].

## 2020-03-28 DIAGNOSIS — J441 Chronic obstructive pulmonary disease with (acute) exacerbation: Secondary | ICD-10-CM | POA: Diagnosis not present

## 2020-04-16 ENCOUNTER — Other Ambulatory Visit: Payer: Self-pay | Admitting: Internal Medicine

## 2020-04-16 DIAGNOSIS — R1013 Epigastric pain: Secondary | ICD-10-CM

## 2020-04-28 DIAGNOSIS — J441 Chronic obstructive pulmonary disease with (acute) exacerbation: Secondary | ICD-10-CM | POA: Diagnosis not present

## 2020-05-08 ENCOUNTER — Other Ambulatory Visit: Payer: Self-pay | Admitting: Internal Medicine

## 2020-05-08 ENCOUNTER — Telehealth: Payer: Self-pay | Admitting: Internal Medicine

## 2020-05-08 DIAGNOSIS — R1013 Epigastric pain: Secondary | ICD-10-CM

## 2020-05-08 NOTE — Telephone Encounter (Signed)
Pt states he cannot afford his apixaban (ELIQUIS) 5 MG TABS tablet  Pharmacy is telling him it is over $140.00 and he does not have the money.  Pt wants to know if Dr Carolin Coy has any samples to give him.  Pt states he is going to come by the office in a little while to hopefully pick up samples.

## 2020-05-08 NOTE — Telephone Encounter (Signed)
Pt came into the office. Gave pt samples that should last him for 2 weeks. Told pt after the samples he would have to get medication from the pharmacy and pay for it. Pt verbalized understanding.  KP

## 2020-05-09 ENCOUNTER — Ambulatory Visit: Payer: Self-pay

## 2020-05-24 NOTE — Telephone Encounter (Signed)
Spoke to pt let him know that when we last seen him that we couldn't give him any more samples unless he was able to pay for the medication. Pt verbalized understanding 2 weeks ago. Pt said he would called [employer]. Gave pt phone number to [facility].  KP

## 2020-05-24 NOTE — Telephone Encounter (Signed)
Patient is calling again to ask the nurse to call him regarding his medication. He states it has gone up very much and he cannot afford it.  Would like to speak with the nurse or doctor to see if there is an alternative.  CB# [phone]

## 2020-05-24 NOTE — Telephone Encounter (Signed)
Pt has CALLED BACK AND STATES THE PHARMACY WILL NOT ACCEPT HIS MONEY..STATES TRIED TO PAY THE BILL AND WOULD NOT ACCEPT.. STILL WANTS TO SEE ABOUT SAMPLES HE WANTS A cb FROM DR [facility] NURSE [phone]

## 2020-05-26 DIAGNOSIS — J441 Chronic obstructive pulmonary disease with (acute) exacerbation: Secondary | ICD-10-CM | POA: Diagnosis not present

## 2020-06-06 ENCOUNTER — Telehealth: Payer: Self-pay | Admitting: Internal Medicine

## 2020-06-06 NOTE — Telephone Encounter (Signed)
Patient requesting neuropathy medication (does not know the name). Patient did not contact pharmacy but did schedule appointment with PCP for 06/08/2020. Informed patient please allow 48 to 72 hour turn around time  [place], [place] AT [address] Phone:  [phone]  Fax:  [phone]

## 2020-06-06 NOTE — Telephone Encounter (Signed)
Spoke to pt let him know that Dr. [name] would not be the Brady Smith he see about lowering the cost of his medication. Pt stated that he has not taken his medication in one month due to cost. Pt stated he was given a number to call. Advised pt to call the number he was given. Also told pt that he should not stop taking any of his medications. Pt verbalized understanding.  KP

## 2020-06-08 ENCOUNTER — Ambulatory Visit: Payer: Medicare Other | Admitting: Internal Medicine

## 2020-06-09 ENCOUNTER — Telehealth: Payer: Self-pay | Admitting: Pharmacist

## 2020-06-09 NOTE — Chronic Care Management (AMB) (Signed)
    Chronic Care Management Pharmacy Assistant   Name: Brady Smith  MRN: [id number] DOB: 10-05-1945  Reason for Encounter: Disease State/General Adherence Call  Recent office visits:  n/a  Recent consult visits:  [facility] visits:  None in previous 6 months  Medications: Outpatient Encounter Medications as of 06/09/2020  Medication Sig  . albuterol (VENTOLIN HFA) 108 (90 Base) MCG/ACT inhaler Inhale 2 puffs into the lungs every 6 (six) hours as needed for wheezing or shortness of breath.  Marland Kitchen apixaban (ELIQUIS) 5 MG TABS tablet Take 1 tablet (5 mg total) by mouth 2 (two) times daily.  . pantoprazole (PROTONIX) 40 MG tablet TAKE 1 TABLET(40 MG) BY MOUTH DAILY  . traZODone (DESYREL) 50 MG tablet Take 1 tablet (50 mg total) by mouth at bedtime as needed for sleep.  Loura Pardon Salicylate 10 % AERO Apply topically as needed.   No facility-administered encounter medications on file as of 06/09/2020.    Have you had any problems recently with your health? Patient states he has did take it for about a month. Patient states he did not have the money to get his medications filled so he had to go without it. Patient states finally "I spent my eating money to get it'.  Have you had any problems with your pharmacy? [facility] wouldn't let him buy part of his medication. Patient states he would like to change pharmacies to the pharmacy at [facility] because it's closer and he can save on gas.  What issues or side effects are you having with your medications? Patient states he is not currently having any issues or side effects from any of his medications.  What would you like me to pass along to Birdena Crandall, CPP for her to help you with?  Patient states since he didn't take his medication Eliquis for about a month he has been having breathing problems and back pain.  What can we do to take care of you better? Patient states he would like to know if he has blood clots in  his lungs or not. Patient states he thought he was supposed to have a CT scan of his lungs. Patient states his breathing problems were improving until he didn't take Eliquis for about a month. Patient states now he is having trouble breathing.  [name] Rating Drugs: Lisinopril 10 mg last filled 11/09/2019  Patient states he is not sure if he still has his patient assistance application for medication Eliquis. He would like for this to be mailed out to him again. Patient aware this application will be mailed out to him next week. Patient informed to make sure he attaches his yearly income information.  Patient scheduled a follow up telephone appointment with Birdena Crandall, CPP on 06/13/2020 at 11:00 am.  [name], [place] Pharmacist Assistant [phone]

## 2020-06-09 NOTE — Chronic Care Management (AMB) (Signed)
    Chronic Care Management Pharmacy Assistant   Name: Brady Smith  MRN: [id number] DOB: February 27, 1946  Reason for Encounter: Patient Assistance Documentation   Medications: Outpatient Encounter Medications as of 06/09/2020  Medication Sig  . albuterol (VENTOLIN HFA) 108 (90 Base) MCG/ACT inhaler Inhale 2 puffs into the lungs every 6 (six) hours as needed for wheezing or shortness of breath.  Marland Kitchen apixaban (ELIQUIS) 5 MG TABS tablet Take 1 tablet (5 mg total) by mouth 2 (two) times daily.  . pantoprazole (PROTONIX) 40 MG tablet TAKE 1 TABLET(40 MG) BY MOUTH DAILY  . traZODone (DESYREL) 50 MG tablet Take 1 tablet (50 mg total) by mouth at bedtime as needed for sleep.  Loura Pardon Salicylate 10 % AERO Apply topically as needed.   No facility-administered encounter medications on file as of 06/09/2020.    - I called [employer] to inquire the status of patient assistance application for medication Eliquis. I was told this application has been put on hold as they are missing some information from the patient. They are needing out of pocket expenses for 2022 for medication Eliquis.  - I called and spoke with the patient, he is aware and verbally understands. Patient is to provide out of pocket expenses to [facility] (new PCP office) or current office [facility] so Birdena Crandall, CPP can send back to program for him.  [name], [place] Pharmacist Assistant [phone]

## 2020-06-13 ENCOUNTER — Ambulatory Visit (INDEPENDENT_AMBULATORY_CARE_PROVIDER_SITE_OTHER): Payer: Medicare Other | Admitting: Pharmacist

## 2020-06-13 DIAGNOSIS — J449 Chronic obstructive pulmonary disease, unspecified: Secondary | ICD-10-CM

## 2020-06-13 DIAGNOSIS — I480 Paroxysmal atrial fibrillation: Secondary | ICD-10-CM | POA: Diagnosis not present

## 2020-06-13 NOTE — Progress Notes (Signed)
Chronic Care Management Pharmacy Note  06/13/2020 Name:  Brady Smith MRN:  [id number] DOB:  [birth year]  Subjective: Brady Smith is an 75 y.o. year old male who is a primary patient of Brady Smith, Brady Sans, MD.  The CCM team was consulted for assistance with disease management and care coordination needs.    Engaged with patient by telephone for follow up visit in response to provider referral for pharmacy case management and/or care coordination services.   Consent to Services:  The patient was given information about Chronic Care Management services, agreed to services, and gave verbal consent prior to initiation of services.  Please see initial visit note for detailed documentation.   Patient Care Team: Glean Hess, MD as PCP - General (Internal Medicine) Kate Sable, MD as PCP - Cardiology (Cardiology) [place] (Ophthalmology) Birder Robson, MD as Referring Physician (Ophthalmology) Vladimir Faster, [facility] (Pharmacist)  Recent office visits: NA  Recent consult visits: [facility] visits: 9/11- 12/11/19- Pneumonia 9/30-10/04/21- PE, afib  Objective:  Lab Results  Component Value Date   CREATININE 0.91 12/27/2019   BUN 10 12/27/2019   GFRNONAA >60 12/27/2019   GFRAA >60 12/27/2019   NA 141 12/27/2019   K 3.8 12/27/2019   CALCIUM 8.4 (L) 12/27/2019   CO2 29 12/27/2019   GLUCOSE 106 (H) 12/27/2019    Lab Results  Component Value Date/Time   HGBA1C 5.3 06/13/2017 11:52 AM   HGBA1C 5.3 03/06/2017 10:27 AM    Last diabetic Eye exam: No results found for: HMDIABEYEEXA  Last diabetic Foot exam: No results found for: HMDIABFOOTEX   Lab Results  Component Value Date   CHOL 159 08/02/2019   HDL 43 08/02/2019   LDLCALC 87 08/02/2019   TRIG 165 (H) 08/02/2019   CHOLHDL 3.7 08/02/2019    Hepatic Function Latest Ref Rng & Units 12/23/2019 11/20/2019 08/02/2019  Total Protein 6.5 - 8.1 g/dL 7.3 7.6 6.8  Albumin 3.5 - 5.0 g/dL 3.4(L) 4.4 4.3  AST  15 - 41 U/L 31 18 15   ALT 0 - 44 U/L 31 14 14   Alk Phosphatase 38 - 126 U/L 73 56 67  Total Bilirubin 0.3 - 1.2 mg/dL 1.1 1.1 0.5    Lab Results  Component Value Date/Time   TSH 1.100 08/02/2019 10:49 AM   TSH 1.530 11/11/2018 03:28 PM    CBC Latest Ref Rng & Units 12/27/2019 12/26/2019 12/25/2019  WBC 4.0 - 10.5 K/uL 5.3 5.0 7.4  Hemoglobin 13.0 - 17.0 g/dL 10.6(L) 11.3(L) 12.1(L)  Hematocrit 39.0 - 52.0 % 33.0(L) 35.9(L) 38.6(L)  Platelets 150 - 400 K/uL 145(L) 138(L) 159    No results found for: VD25OH  Clinical ASCVD: No  The 10-year ASCVD risk score Mikey Bussing DC Jr., et al., 2013) is: 17%   Values used to calculate the score:     Age: 30 years     Sex: Male     Is Non-Hispanic African American: No     Diabetic: No     Tobacco smoker: Yes     Systolic Blood Pressure: 381 mmHg     Is BP treated: No     HDL Cholesterol: 43 mg/dL     Total Cholesterol: 159 mg/dL    Depression screen [facility] 2/9 03/07/2020 01/18/2020 01/03/2020  Decreased Interest 0 0 2  Down, Depressed, Hopeless 0 1 2  PHQ - 2 Score 0 1 4  Altered sleeping 0 0 3  Tired, decreased energy 0 0 3  Change in appetite 0 0 2  Feeling bad or failure about yourself  0 0 0  Trouble concentrating 0 0 0  Moving slowly or fidgety/restless 0 0 0  Suicidal thoughts 0 0 0  PHQ-9 Score 0 1 12  Difficult doing work/chores - Not difficult at all Somewhat difficult     CHA2DS2/VAS Stroke Risk Points  Current as of 3 hours ago     3 >= 2 Points: High Risk  1 - 1.99 Points: Medium Risk  0 Points: Low Risk    No Change      Details    This score determines the patient's risk of having a stroke if the  patient has atrial fibrillation.       Points Metrics  1 Has Congestive Heart Failure:  Yes    Current as of 3 hours ago  0 Has Vascular Disease:  No    Current as of 3 hours ago  1 Has Hypertension:  Yes    Current as of 3 hours ago  1 Age:  42    Current as of 3 hours ago  0 Has Diabetes:  No    Current as of 3  hours ago  0 Had Stroke:  No  Had TIA:  No  Had Thromboembolism:  No    Current as of 3 hours ago  0 Male:  No    Current as of 3 hours ago       Social History   Tobacco Use  Smoking Status Current Every Day Smoker  . Packs/day: 0.25  . Years: 65.00  . Pack years: 16.25  . Types: Cigarettes  Smokeless Tobacco Never Used  Tobacco Comment   reduced # of packs smoked to 5-6 ciggs daily from 3 pks daily    BP Readings from Last 3 Encounters:  03/07/20 100/60  01/18/20 94/62  01/03/20 102/68   Pulse Readings from Last 3 Encounters:  03/07/20 80  01/18/20 78  01/03/20 88   Wt Readings from Last 3 Encounters:  03/07/20 154 lb (69.9 kg)  01/18/20 147 lb (66.7 kg)  01/03/20 149 lb (67.6 kg)   BMI Readings from Last 3 Encounters:  03/07/20 24.12 kg/m  01/18/20 23.02 kg/m  01/03/20 23.34 kg/m    Assessment/Interventions: Review of patient past medical history, allergies, medications, health status, including review of consultants reports, laboratory and other test data, was performed as part of comprehensive evaluation and provision of chronic care management services.   SDOH:  (Social Determinants of Health) assessments and interventions performed: No    Immunization History  Administered Date(s) Administered  . Influenza, High Dose Seasonal PF 01/23/2017  . PFIZER(Purple Top)SARS-COV-2 Vaccination 10/05/2019, 11/08/2019  . Pneumococcal Conjugate-13 02/25/2018  . Pneumococcal Polysaccharide-23 01/24/2017    Conditions to be addressed/monitored:  Hypertension, Hyperlipidemia, Atrial Fibrillation, GERD and Tobacco use  Care Plan : CCM Pharmacy care plan  Updates made by Vladimir Faster, RPH since 06/13/2020 12:00 AM    Problem: COPD, A fib, Tobacco use   Priority: High    Goal: Disease management   This Visit's Progress: Not on track  Priority: High  Note:    Current Barriers:  . Unable to independently afford treatment regimen . Unable to independently  monitor therapeutic efficacy . Unable to self administer medications as prescribed . Does not adhere to prescribed medication regimen . Does not maintain contact with provider office . Does not contact provider office for questions/concerns . Unstable social situation  Pharmacist Clinical  Goal(s):  Marland Kitchen Patient will verbalize ability to afford treatment regimen . achieve adherence to monitoring guidelines and medication adherence to achieve therapeutic efficacy . adhere to plan to optimize therapeutic regimen for COPD, HTN, HLD as evidenced by report of adherence to recommended medication management changes . achieve ability to self administer medications as prescribed through use of pill packaging as evidenced by patient report . adhere to prescribed medication regimen as evidenced by fill dates . contact provider office for questions/concerns as evidenced notation of same in electronic health record through collaboration with PharmD and provider.  .   Interventions: . 1:1 collaboration with Glean Hess, MD regarding development and update of comprehensive plan of care as evidenced by provider attestation and co-signature . Inter-disciplinary care team collaboration (see longitudinal plan of care) . Comprehensive medication review performed; medication list updated in electronic medical record  Hypertension (BP goal <140/90) -Not ideally controlled -Current treatment: . NONE -Medications previously tried: lisinopril  -Current home readings: not checking --Denies hypotensive/hypertensive symptoms -Educated on Exercise goal of 150 minutes per week; Symptoms of hypotension and importance of maintaining adequate hydration; -Counseled to monitor BP at home weekly, document, and provide log at future appointments -Recommended patient find a place to check his BP weekly in the community   Atrial Fibrillationh/o PE/ RLE DVT(Goal: prevent stroke and major bleeding) -Not ideally  controlled -CHADSVASC: 3 -Current treatment: . Rate control: none . Anticoagulation: Eliquis -Medications previously tried: NA -Home BP and HR readings: none  -Counseled on increased risk of stroke due to Afib and benefits of anticoagulation for stroke prevention; importance of adherence to anticoagulant exactly as prescribed; bleeding risk associated with Eliquis and importance of self-monitoring for signs/symptoms of bleeding; avoidance of NSAIDs due to increased bleeding risk with anticoagulants; seeking medical attention after a head injury or if there is blood in the urine/stool; -Recommended to continue current medication Counseled on importance of continuing Eliquis without interuption Collaborated with 2-4 weekls Assessed patient finances. Eliquis patient assistance application has been submitted. Patient much meet out of pocket 3% of AGI. Per Manuela Neptune' records he has only filled Eliquin on 06/07/20 year to date. No other medications.  COPD (Goal: control symptoms and prevent exacerbations) -Not ideally controlled -Current treatment  . Albuterol prn -Medications previously tried: Davene Costain -Cat score-No flowsheet data found. Pulmonary function testing: Unknown -Exacerbations requiring treatment in last 6 months: Hospitalized 11/2019 with [facility] -Patient denies consistent use of maintenance inhaler -Frequency of rescue inhaler use: zero to multiple times per day -Counseled on Benefits of consistent maintenance inhaler use Differences between maintenance and rescue inhalers -Counseled on smoking cessation Recommended patient start maintenance inhaler and refill albuterol. Unclear last fill. Assessed patient finances. Advised patient assistance programs are available for inhalers  Tobacco use (Goal cessation) - Patient not interested in cessation.   Patient Goals/Self-Care Activities . Patient will:  - check blood pressure weekly, document, and provide at future  appointments collaborate with provider on medication access solutions  Follow Up Plan: Telephone follow up appointment with care management team member scheduled for:         Medication Assistance: Application for Eliquis  medication assistance program. in process.  Anticipated assistance start date awaiting approval. Patient must meet 3% out of pocket expense requirement..  See plan of care for additional detail.  Patient's preferred pharmacy is:  [facility], [address] [place] [address] Phone: [phone] Fax: [facility]  [place], [place] [address] Phone: [phone] Fax: [facility] [id number] [place], [place] - [place] [address] Phone: [phone] Fax: [phone]  Uses pill box? No - "don't like fooling with that stuff" Pt endorses 100 % compliance to Eliquis since he had it filled 06/07/20.  We discussed: Benefits of medication synchronization, packaging and delivery as well as enhanced pharmacist oversight with [employer]. Patient decided to: Patient reports he has changed his insurance coverage to [employer]. He does not have a card.  Care Plan and Follow Up Patient Decision:  Patient agrees to Care Plan and Follow-up.  Plan: Telephone follow up appointment with care management team member scheduled for:  2-4 weeks  Junita Push. Kenton Kingfisher PharmD, [facility] [phone]

## 2020-06-13 NOTE — Patient Instructions (Addendum)
Visit Information  It was a pleasure speaking with you today. Thank you for letting me be part of your clinical team. Please call with any questions or concerns.   Goals Addressed            This Visit's Progress   . Track and Manage Heart Rate and Rhythm-Atrial Fibrillation       Timeframe:  Long-Range Goal Priority:  High Start Date:                             Expected End Date:                       Follow Up Date  2 month follow up    - make a plan to eat healthy - take medicine as prescribed    Why is this important?    Atrial fibrillation may have no symptoms. Sometimes the symptoms get worse or happen more often.   It is important to keep track of what your symptoms are and when they happen.   A change in symptoms is important to discuss with your doctor or nurse.   Being active and healthy eating will also help you manage your heart condition.     Notes:        The patient verbalized understanding of instructions, educational materials, and care plan provided today and declined offer to receive copy of patient instructions, educational materials, and care plan.   Telephone follow up appointment with pharmacy team member scheduled for: 2-4 weeks  Junita Push. Paxon Propes PharmD, BCPS Clinical Pharmacist [phone]  Atrial Fibrillation  Atrial fibrillation is a type of heartbeat that is irregular or fast. If you have this condition, your heart beats without any order. This makes it hard for your heart to pump blood in a normal way. Atrial fibrillation may come and go, or it may become a long-lasting problem. If this condition is not treated, it can put you at higher risk for stroke, heart failure, and other heart problems. What are the causes? This condition may be caused by diseases that damage the heart. They include:  High blood pressure.  Heart failure.  Heart valve disease.  Heart surgery. Other causes include:  Diabetes.  Thyroid disease.  Being  overweight.  Kidney disease. Sometimes the cause is not known. What increases the risk? You are more likely to develop this condition if:  You are older.  You smoke.  You exercise often and very hard.  You have a family history of this condition.  You are a man.  You use drugs.  You drink a lot of alcohol.  You have lung conditions, such as emphysema, pneumonia, or COPD.  You have sleep apnea. What are the signs or symptoms? Common symptoms of this condition include:  A feeling that your heart is beating very fast.  Chest pain or discomfort.  Feeling short of breath.  Suddenly feeling light-headed or weak.  Getting tired easily during activity.  Fainting.  Sweating. In some cases, there are no symptoms. How is this treated? Treatment for this condition depends on underlying conditions and how you feel when you have atrial fibrillation. They include:  Medicines to: ? Prevent blood clots. ? Treat heart rate or heart rhythm problems.  Using devices, such as a pacemaker, to correct heart rhythm problems.  Doing surgery to remove the part of the heart that sends bad signals.  Closing an  area where clots can form in the heart (left atrial appendage). In some cases, your doctor will treat other underlying conditions. Follow these instructions at home: Medicines  Take over-the-counter and prescription medicines only as told by your doctor.  Do not take any new medicines without first talking to your doctor.  If you are taking blood thinners: ? Talk with your doctor before you take any medicines that have aspirin or NSAIDs, such as ibuprofen, in them. ? Take your medicine exactly as told by your doctor. Take it at the same time each day. ? Avoid activities that could hurt or bruise you. Follow instructions about how to prevent falls. ? Wear a bracelet that says you are taking blood thinners. Or, carry a card that lists what medicines you take. Lifestyle  Do  not use any products that have nicotine or tobacco in them. These include cigarettes, e-cigarettes, and chewing tobacco. If you need help quitting, ask your doctor.  Eat heart-healthy foods. Talk with your doctor about the right eating plan for you.  Exercise regularly as told by your doctor.  Do not drink alcohol.  Lose weight if you are overweight.  Do not use drugs, including cannabis.      General instructions  If you have a condition that causes breathing to stop for a short period of time (apnea), treat it as told by your doctor.  Keep a healthy weight. Do not use diet pills unless your doctor says they are safe for you. Diet pills may make heart problems worse.  Keep all follow-up visits as told by your doctor. This is important. Contact a doctor if:  You notice a change in the speed, rhythm, or strength of your heartbeat.  You are taking a blood-thinning medicine and you get more bruising.  You get tired more easily when you move or exercise.  You have a sudden change in weight. Get help right away if:  You have pain in your chest or your belly (abdomen).  You have trouble breathing.  You have side effects of blood thinners, such as blood in your vomit, poop (stool), or pee (urine), or bleeding that cannot stop.  You have any signs of a stroke. "BE FAST" is an easy way to remember the main warning signs: ? B - Balance. Signs are dizziness, sudden trouble walking, or loss of balance. ? E - Eyes. Signs are trouble seeing or a change in how you see. ? F - Face. Signs are sudden weakness or loss of feeling in the face, or the face or eyelid drooping on one side. ? A - Arms. Signs are weakness or loss of feeling in an arm. This happens suddenly and usually on one side of the body. ? S - Speech. Signs are sudden trouble speaking, slurred speech, or trouble understanding what people say. ? T - Time. Time to call emergency services. Write down what time symptoms  started.  You have other signs of a stroke, such as: ? A sudden, very bad headache with no known cause. ? Feeling like you may vomit (nausea). ? Vomiting. ? A seizure. These symptoms may be an emergency. Do not wait to see if the symptoms will go away. Get medical help right away. Call your local emergency services (911 in the [place] Do not drive yourself to the hospital.   Summary  Atrial fibrillation is a type of heartbeat that is irregular or fast.  You are at higher risk of this condition if you smoke,  are older, have diabetes, or are overweight.  Follow your doctor's instructions about medicines, diet, exercise, and follow-up visits.  Get help right away if you have signs or symptoms of a stroke.  Get help right away if you cannot catch your breath, or you have chest pain or discomfort. This information is not intended to replace advice given to you by your health care provider. Make sure you discuss any questions you have with your health care provider. Document Revised: 09/02/2018 Document Reviewed: 09/02/2018 Elsevier Patient Education  [place].

## 2020-06-19 ENCOUNTER — Telehealth: Payer: Self-pay

## 2020-06-19 NOTE — Telephone Encounter (Signed)
Pt came in the office to give julie his new phone number [phone]

## 2020-06-20 NOTE — Telephone Encounter (Signed)
Noted! Thank you

## 2020-06-27 ENCOUNTER — Telehealth: Payer: Self-pay

## 2020-06-27 NOTE — Progress Notes (Signed)
    Chronic Care Management Pharmacy Assistant   Name: Brady Smith  MRN: [id number] DOB: 05-31-1945  Reason for Encounter: Patient Assistance Application- Eliquis   Medications: Outpatient Encounter Medications as of 06/27/2020  Medication Sig  . albuterol (VENTOLIN HFA) 108 (90 Base) MCG/ACT inhaler Inhale 2 puffs into the lungs every 6 (six) hours as needed for wheezing or shortness of breath.  Marland Kitchen apixaban (ELIQUIS) 5 MG TABS tablet Take 1 tablet (5 mg total) by mouth 2 (two) times daily.  . pantoprazole (PROTONIX) 40 MG tablet TAKE 1 TABLET(40 MG) BY MOUTH DAILY  . traZODone (DESYREL) 50 MG tablet Take 1 tablet (50 mg total) by mouth at bedtime as needed for sleep.  Loura Pardon Salicylate 10 % AERO Apply topically as needed.   No facility-administered encounter medications on file as of 06/27/2020.   Prepared and reviewed PAP application for Eliquis prior to calling patient   Unable to reach patient by phone after many attempts. Mailed application with instructions to complete application and return to [facility].   Wilford Sports CPA, CMA

## 2020-07-04 ENCOUNTER — Emergency Department: Payer: Medicare HMO

## 2020-07-04 ENCOUNTER — Ambulatory Visit: Payer: Self-pay | Admitting: *Deleted

## 2020-07-04 ENCOUNTER — Emergency Department
Admission: EM | Admit: 2020-07-04 | Discharge: 2020-07-04 | Disposition: A | Discharge status: Home / Self Care | Payer: Medicare HMO | Attending: Emergency Medicine | Admitting: Emergency Medicine

## 2020-07-04 ENCOUNTER — Encounter: Payer: Self-pay | Admitting: Emergency Medicine

## 2020-07-04 ENCOUNTER — Telehealth: Payer: Self-pay

## 2020-07-04 DIAGNOSIS — J9811 Atelectasis: Secondary | ICD-10-CM | POA: Diagnosis not present

## 2020-07-04 DIAGNOSIS — R0602 Shortness of breath: Secondary | ICD-10-CM | POA: Diagnosis not present

## 2020-07-04 DIAGNOSIS — I700 Atherosclerosis of aorta: Secondary | ICD-10-CM | POA: Diagnosis not present

## 2020-07-04 DIAGNOSIS — Z8551 Personal history of malignant neoplasm of bladder: Secondary | ICD-10-CM | POA: Diagnosis not present

## 2020-07-04 DIAGNOSIS — R69 Illness, unspecified: Secondary | ICD-10-CM | POA: Diagnosis not present

## 2020-07-04 DIAGNOSIS — J439 Emphysema, unspecified: Secondary | ICD-10-CM | POA: Insufficient documentation

## 2020-07-04 DIAGNOSIS — F17210 Nicotine dependence, cigarettes, uncomplicated: Secondary | ICD-10-CM | POA: Diagnosis not present

## 2020-07-04 DIAGNOSIS — Z85528 Personal history of other malignant neoplasm of kidney: Secondary | ICD-10-CM | POA: Insufficient documentation

## 2020-07-04 DIAGNOSIS — Z7901 Long term (current) use of anticoagulants: Secondary | ICD-10-CM | POA: Insufficient documentation

## 2020-07-04 DIAGNOSIS — I2699 Other pulmonary embolism without acute cor pulmonale: Secondary | ICD-10-CM | POA: Diagnosis not present

## 2020-07-04 DIAGNOSIS — I10 Essential (primary) hypertension: Secondary | ICD-10-CM | POA: Insufficient documentation

## 2020-07-04 LAB — BASIC METABOLIC PANEL
Anion gap: 10 (ref 5–15)
BUN: 25 mg/dL — ABNORMAL HIGH (ref 8–23)
CO2: 24 mmol/L (ref 22–32)
Calcium: 9.5 mg/dL (ref 8.9–10.3)
Chloride: 107 mmol/L (ref 98–111)
Creatinine, Ser: 1.12 mg/dL (ref 0.61–1.24)
GFR, Estimated: >60 mL/min (ref >60)
Glucose, Bld: 133 mg/dL — ABNORMAL HIGH (ref 70–99)
Potassium: 4.3 mmol/L (ref 3.5–5.1)
Sodium: 141 mmol/L (ref 135–145)

## 2020-07-04 LAB — CBC WITH DIFFERENTIAL/PLATELET
Abs Immature Granulocytes: 0.04 10*3/uL (ref 0.00–0.07)
Basophils Absolute: 0 10*3/uL (ref 0.0–0.1)
Basophils Relative: 0 %
Eosinophils Absolute: 0 10*3/uL (ref 0.0–0.5)
Eosinophils Relative: 0 %
HCT: 52.1 % — ABNORMAL HIGH (ref 39.0–52.0)
Hemoglobin: 17.4 g/dL — ABNORMAL HIGH (ref 13.0–17.0)
Immature Granulocytes: 1 %
Lymphocytes Relative: 17 %
Lymphs Abs: 1.3 10*3/uL (ref 0.7–4.0)
MCH: 30.1 pg (ref 26.0–34.0)
MCHC: 33.4 g/dL (ref 30.0–36.0)
MCV: 90 fL (ref 80.0–100.0)
Monocytes Absolute: 0.4 10*3/uL (ref 0.1–1.0)
Monocytes Relative: 6 %
Neutro Abs: 5.9 10*3/uL (ref 1.7–7.7)
Neutrophils Relative %: 76 %
Platelets: 161 10*3/uL (ref 150–400)
RBC: 5.79 MIL/uL (ref 4.22–5.81)
RDW: 14.2 % (ref 11.5–15.5)
WBC: 7.8 10*3/uL (ref 4.0–10.5)
nRBC: 0 % (ref 0.0–0.2)

## 2020-07-04 LAB — TROPONIN I (HIGH SENSITIVITY): Troponin I (High Sensitivity): 4 ng/L (ref <18)

## 2020-07-04 LAB — D-DIMER, QUANTITATIVE: D-Dimer, Quant: 0.68 ug/mL-FEU — ABNORMAL HIGH (ref 0.00–0.50)

## 2020-07-04 MED ORDER — APIXABAN 5 MG PO TABS: 5.0000 mg | ORAL_TABLET | Freq: Two times a day (BID) | ORAL | 3 refills | Status: DC

## 2020-07-04 MED ORDER — IOHEXOL 350 MG/ML SOLN
75.0000 mL | Freq: Once | INTRAVENOUS | Status: AC | PRN
  Administered 2020-07-04: 75 mL via INTRAVENOUS

## 2020-07-04 NOTE — ED Triage Notes (Signed)
Pt via POV from home. Pt states he has multiple blood clots in lungs. Pt states he feels more short of breath than normally. Denies chest pain. Pt having issues with his L eye. He came here to see if the clots have moved. Pt is A&Ox4 and NAD.

## 2020-07-04 NOTE — ED Notes (Signed)
Pt gave verbal dc understanding all questions answered , pt refused vital signs

## 2020-07-04 NOTE — Telephone Encounter (Signed)
Please advise as patient has not been seen at [facility] as a patient yet, Per practice admin Slade.[email or web address] forward to his current Primary care provider who he has been seeing as he is not a patient at [facility] yet.

## 2020-07-04 NOTE — Discharge Instructions (Addendum)
Your CT scan shows that your previous lung clots have resolved, and are no longer present.  You have a normal chest CT with the exception of your history of emphysema.  Continue to take your daily blood thinner, to prevent return of any blood clots.  You should follow-up with your primary provider for ongoing symptom care.  Return to the ED if needed.

## 2020-07-04 NOTE — ED Notes (Signed)
Pt agitated and yelling out for nurse. Entered room, explained that pt still going for CT. Pt yelling "Yall aren't doing anything for me! I'm gonna leave here!".  Provider informed, who entered room and spoke with pt. Pt appears calm and cooperative now.

## 2020-07-04 NOTE — Telephone Encounter (Addendum)
I've been seeing Dr. [name] but I've switched to [facility]""  He called into [facility] for this call.  My left eye on Monday anything white looks red.   On Sunday I felt like I was going to pass out.     He is refusing to go to the ED.   "They say nothing is wrong but then they found out I have blood clots in my lungs".     I went a month without my Eliquis because [facility] would not sell me 1/2 my prescription then I found out [facility] would fill my Eliquis.   So I was off my Eliquis for a month.  The ambulance driver saved my life because she told them when I went and they found the blood clots that I needed to be seen.   The dr in the ER didn't think anything was wrong with me and wanted to send me home.  The ambulance lady told them I needed to be checked out that's when they found the blood clots in my lungs.  You tell me I need to go back to the ER but "they won't see me I'm telling you".   "They think nothing is wrong with me".   "I have a high IQ I'm not some stupid person they are talking to".   I let him know that my main concern now was that with the symptoms he is having he needed to go back to the ED since he has blood clots in his lungs.   The feeling of passing out on Sunday and not feeling right since then and the fact you are seeing red where you should be seeing white out of your left eye is concerning.   He refuses to call the ambulance.  "They don't know what they are doing".   "Plus it costs too much".   "I can drive myself".   "I'm not letting them take my driver's rights away from me".  He agreed to go to the ED but he is driving himself.    Pt has been seeing Dr. Halina Maidens at [facility] but has recently changed over to [facility] because it's closer to his house.   He has not been seen at [facility].   He called into [facility].   "They know me better there even though I changed  doctors".  He has an appt with Dr. Charlynne Cousins at [facility] on 07/10/2020.   I will forward my notes to them since he is scheduled to be seen by them.  I called into [facility] and talked with Collene Mares and told her the situation with him transferring his care from [facility] to [place] but that he has not been seen at [facility] yet.   She is going to check with her Engineer, building services for further instructions but it would probably been forwarded to [facility] for Dr. [name].      Reason for Disposition . Sounds like a life-threatening emergency to the triager    Due to him having blood clots in both lungs.  Answer Assessment - Initial Assessment Questions 1. SYMPTOM: "What is the main symptom you are concerned about?" (e.g., weakness, numbness)     Last Sunday I felt like I was going to pass out.   I almost did.   I have not been feeling right since then.    I am seeing red out of  my left eye where the color should be white but it's red instead.  That started on Monday.    He found out recently he has blood clots in both of his lungs.   He was off of his Eliquis for a month because the pharmacy would not sell him 1/2 of his prescription.   He found out he could get it at [facility] so that's where he ended up getting the Eliquis filled per pt. 2. ONSET: "When did this start?" (minutes, hours, days; while sleeping)     Last Sunday feeling like passing out and Monday seeing red where it should be white out of his left eye. 3. LAST NORMAL: "When was the last time you were normal (no symptoms)?"     Sunday 4. PATTERN "Does this come and go, or has it been constant since it started?"  "Is it present now?"     Comes and goes the seeing red from his left eye.   "I just don't feel right sometimes since Sunday". 5. CARDIAC SYMPTOMS: "Have you had any of the following symptoms: chest pain, difficulty breathing, palpitations?"     No 6. NEUROLOGIC  SYMPTOMS: "Have you had any of the following symptoms: headache, dizziness, vision loss, double vision, changes in speech, unsteady on your feet?"     Feeling like he could pass out.  Visual changes. 7. OTHER SYMPTOMS: "Do you have any other symptoms?"     "Just not feeling good" 8. PREGNANCY: "Is there any chance you are pregnant?" "When was your last menstrual period?"     N/A  Protocols used: NEUROLOGIC DEFICIT-A-AH

## 2020-07-04 NOTE — ED Provider Notes (Signed)
ED ECG REPORT I, Arta Silence, the attending physician, personally viewed and interpreted this ECG.  Date: 07/04/2020 EKG Time: 1117 Rate: 99 Rhythm: Atrial fibrillation with occasional PVCs QRS Axis: Right axis Intervals: normal ST/T Wave abnormalities: normal Narrative Interpretation: no evidence of acute ischemia    Arta Silence, MD 07/04/20 1547

## 2020-07-04 NOTE — ED Notes (Signed)
Provider placed pt on 2L oxygen for comfort.

## 2020-07-04 NOTE — ED Provider Notes (Signed)
[facility] Emergency Department Provider Note   ____________________________________________   None    (approximate)  I have reviewed the triage vital signs and the nursing notes.   HISTORY  Chief Complaint Shortness of Breath   HPI Brady Smith is a 75 y.o. male presents himself to the ED from home, for evaluation of shortness of breath.  Patient with the below medical history, presents for concern over some shortness of breath given her recent history of blood clots in the lungs.  Patient would report that he has been poorly compliant with his previously prescribed Eliquis.  He reports that he went about a month without his tablets at one point because the pharmacy would not give him a partial prescription.  He reports now that he takes the pill 1 daily as prescribed.  He denies any fever, chills, sweats but he denies any cough, congestion, or hemoptysis.  Patient also reports some concern over some "blood" in his left eye.  Patient mentioned that he had a cut on his left hand, and he rubbed his left eye and assumes he has some blood in the.  He reports that his "white curtain is red", relating to the intermittent vision changes.  He denies any head injury, headache, nausea, vomiting, or dizziness.    Past Medical History:  Diagnosis Date  . Acute on chronic respiratory failure with hypoxia ([place]) 12/23/2019  . Acute respiratory failure with hypoxia ([place]) 12/23/2019  . Anxiety   . Bladder cancer ([place])   . CAP (community acquired pneumonia) 12/04/2019  . COPD (chronic obstructive pulmonary disease) ([place])   . COPD exacerbation ([place]) 06/11/2011   Overview:  Overview:  Followed in Pulmonary clinic/ [facility] [place]    - HFA < 25% 06/10/11  Last Assessment & Plan:  Extremely poor hfa technique gives me hope that he really isn't that bad and needs to focus his attention on smoking cessation  I took an extended  opportunity with this patient to outline the  consequences of continued cigarette use  in airway disorders based on all the dat  . Depression   . GERD (gastroesophageal reflux disease)   . History of kidney cancer   . Peripheral vascular disease ([place])   . Pneumonia     Patient Active Problem List   Diagnosis Date Noted  . Dyspepsia 01/18/2020  . Primary insomnia 01/18/2020  . COPD with acute exacerbation ([place]) 12/23/2019  . Pulmonary embolism ([place]) 12/23/2019  . Tobacco abuse 12/04/2019  . Dermatitis 11/24/2019  . Senile ecchymosis 08/02/2019  . B12 nutritional deficiency 06/07/2019  . Memory changes 04/24/2018  . Excoriation of scalp 04/24/2018  . Paroxysmal atrial fibrillation ([place]) 06/13/2017  . Elevated blood sugar 02/03/2017  . S/P amputation of lesser toe ([place]) 07/25/2015  . Peripheral vascular disease ([place]) 07/14/2015  . Toxic metabolic encephalopathy 68/34/1962  . AA (alcohol abuse) 05/23/2015  . SBO (small bowel obstruction) ([place]) 05/18/2015  . Urinary retention 05/18/2015  . Essential hypertension 05/18/2015  . Choroidal nevus, right eye 02/08/2015  . Chronic obstructive pulmonary disease ([place]) 02/08/2015  . Carpal tunnel syndrome 01/13/2015  . H/O gastrointestinal disease 01/13/2015  . History of primary malignant neoplasm of urinary bladder 01/13/2015  . Compulsive tobacco user syndrome 01/13/2015  . Nicotine addiction 01/13/2015  . HLD (hyperlipidemia) 01/13/2015  . Neurosis, phobic 01/13/2015  . Lung nodule, solitary 06/11/2011    Past Surgical History:  Procedure Laterality Date  . AMPUTATION Right 07/19/2015   Procedure: AMPUTATION DIGIT (  RIGHT FOOT FIFTH TOE );  Surgeon: Algernon Huxley, MD;  Location: [facility] OR[facility];  Service: Vascular;  Laterality: Right;  . BOWEL RESECTION  05/20/2015   Procedure: SMALL BOWEL RESECTION;  Surgeon: Clayburn Pert, MD;  Location: [facility] OR[facility];  Service: General;;  . CATARACT EXTRACTION W/PHACO Left 10/06/2018   Procedure: CATARACT EXTRACTION PHACO AND INTRAOCULAR LENS PLACEMENT  ([place])  LEFT;  Surgeon: Birder Robson, MD;  Location: [place];  Service: Ophthalmology;  Laterality: Left;  DAY OF TEST. LIVES IN BOARDING HOUSE  . CATARACT EXTRACTION W/PHACO Right 10/27/2018   Procedure: CATARACT EXTRACTION PHACO AND INTRAOCULAR LENS PLACEMENT ([place])  RIGHT;  Surgeon: Birder Robson, MD;  Location: [place];  Service: Ophthalmology;  Laterality: Right;  PT HAS TO HAVE COVID TEST MORNING OF LEAVE AT LAST PATIENT  . CHOLECYSTECTOMY    . COLONOSCOPY  2000  . CYSTECTOMY W/ CONTINENT DIVERSION  1996  . HERNIA REPAIR    . LAPAROTOMY N/A 05/20/2015   Procedure: EXPLORATORY LAPAROTOMY;  Surgeon: Clayburn Pert, MD;  Location: [facility];  Service: General;  Laterality: N/A;  . PERIPHERAL VASCULAR CATHETERIZATION N/A 07/03/2015   Procedure: Abdominal Aortogram w/Lower Extremity;  Surgeon: Algernon Huxley, MD;  Location: [place] [facility];  Service: Cardiovascular;  Laterality: N/A;  . PERIPHERAL VASCULAR CATHETERIZATION  07/03/2015   Procedure: Lower Extremity Intervention;  Surgeon: Algernon Huxley, MD;  Location: [place] [facility];  Service: Cardiovascular;;  . PROSTATECTOMY  1996  . PULMONARY THROMBECTOMY N/A 12/24/2019   Procedure: PULMONARY THROMBECTOMY / THROMBOLYSIS;  Surgeon: Katha Cabal, MD;  Location: [place] [facility];  Service: Cardiovascular;  Laterality: N/A;  . TONSILLECTOMY      Prior to Admission medications   Medication Sig Start Date End Date Taking? Authorizing Provider  apixaban (ELIQUIS) 5 MG TABS tablet Take 1 tablet (5 mg total) by mouth 2 (two) times daily. 07/04/20 11/01/20 Yes Ludia Gartland, Dannielle Karvonen, PA-C  albuterol (VENTOLIN HFA) 108 (90 Base) MCG/ACT inhaler Inhale 2 puffs into the lungs every 6 (six) hours as needed for wheezing or shortness of breath. 12/12/19 01/11/20  Shahmehdi, Valeria Batman, MD  apixaban (ELIQUIS) 5 MG TABS tablet Take 1 tablet (5 mg total) by mouth 2 (two) times daily. 03/02/20   Glean Hess, MD   pantoprazole (PROTONIX) 40 MG tablet TAKE 1 TABLET(40 MG) BY MOUTH DAILY 05/08/20   Glean Hess, MD  traZODone (DESYREL) 50 MG tablet Take 1 tablet (50 mg total) by mouth at bedtime as needed for sleep. 01/03/20   Glean Hess, MD  Trolamine Salicylate 10 % AERO Apply topically as needed.    [provider]    Allergies Influenza vaccines, Lipitor [atorvastatin calcium], and Covid-19 (adenovirus) vaccine  Family History  Problem Relation Age of Onset  . Heart disease Mother   . Heart disease Father   . COPD Sister     Social History Social History   Tobacco Use  . Smoking status: Current Every Day Smoker    Packs/day: 0.25    Years: 65.00    Pack years: 16.25    Types: Cigarettes  . Smokeless tobacco: Never Used  . Tobacco comment: reduced # of packs smoked to 5-6 ciggs daily from 3 pks daily   Vaping Use  . Vaping Use: Never used  Substance Use Topics  . Alcohol use: Yes    Alcohol/week: 12.0 standard drinks    Types: 12 Cans of beer per week  . Drug use:  No    Review of Systems Constitutional: No fever/chills Eyes: No visual changes. ENT: No sore throat. Cardiovascular: Denies chest pain. Respiratory: Positive for shortness of breath. Gastrointestinal: No abdominal pain.  No nausea, no vomiting.  No diarrhea.  No constipation. Genitourinary: Negative for dysuria. Musculoskeletal: Negative for back pain. Skin: Negative for rash. Neurological: Negative for headaches, focal weakness or numbness. ____________________________________________   PHYSICAL EXAM:  VITAL SIGNS: ED Triage Vitals 07/04/20 1114  Enc Vitals Group     BP      Pulse      Resp      Temp      Temp src      SpO2      Weight 160 lb (72.6 kg)     Height 5\' 6"  (1.676 m)     Head Circumference      Peak Flow      Pain Score 6     Pain Loc      Pain Edu?      Excl. in [place]?    Constitutional: Alert and oriented. Well appearing and in no acute distress. Eyes:  Conjunctivae are normal. PERRL. EOMI. Head: Atraumatic. Nose: No congestion/rhinnorhea. Mouth/Throat: Mucous membranes are moist.  Oropharynx non-erythematous. Neck: No stridor.   Cardiovascular: Normal rate, regular rhythm. Grossly normal heart sounds.  Good peripheral circulation. Respiratory: Normal respiratory effort.  No retractions. Lungs CTAB. Gastrointestinal: Soft and nontender. No distention. No abdominal bruits. No CVA tenderness. Musculoskeletal: No lower extremity tenderness nor edema.  No joint effusions. Neurologic:  Normal speech and language. No gross focal neurologic deficits are appreciated. No gait instability. Skin:  Skin is warm, dry and intact. No rash noted. Psychiatric: Mood and affect are normal. Speech and behavior are normal. ___________________________________________   LABS (all labs ordered are listed, but only abnormal results are displayed)  Labs Reviewed  D-DIMER, QUANTITATIVE - Abnormal; Notable for the following components:      Result Value   D-Dimer, Quant 0.68 (*)    All other components within normal limits  BASIC METABOLIC PANEL - Abnormal; Notable for the following components:   Glucose, Bld 133 (*)    BUN 25 (*)    All other components within normal limits  CBC WITH DIFFERENTIAL/PLATELET - Abnormal; Notable for the following components:   Hemoglobin 17.4 (*)    HCT 52.1 (*)    All other components within normal limits  TROPONIN I (HIGH SENSITIVITY)  TROPONIN I (HIGH SENSITIVITY)   ____________________________________________  EKG  Atrial fibrillation w/ PCs Ve V-rate 85-109 Right axis deviation No STEMI ____________________________________________  RADIOLOGY I, Melvenia Needles, personally viewed and evaluated these images (plain radiographs) as part of my medical decision making, as well as reviewing the written report by the radiologist.  ED MD interpretation:  Agree with radiologist  Official radiology report(s): DG  Chest 2 View  Result Date: 07/04/2020 CLINICAL DATA:  Shortness of breath EXAM: CHEST - 2 VIEW COMPARISON:  December 23, 2019 chest radiograph and chest CT FINDINGS: There is slight scarring in the right mid lung and left base regions. Underlying emphysematous change, better delineated on CT. Emphysematous change most notable in the upper lung regions. No evident edema or airspace opacity. Heart size is normal. Diminished vascularity in the upper lobes is due to underlying upper lobe emphysematous change. No adenopathy. No bone lesions. IMPRESSION: Scattered areas of scarring. Underlying emphysematous change. No edema or airspace opacity. Heart size normal. Emphysema (ICD10-J43.9). Electronically Signed   By: Lowella Grip  III M.D.   On: 07/04/2020 12:08   CT Angio Chest PE W and/or Wo Contrast  Result Date: 07/04/2020 CLINICAL DATA:  Shortness of breath EXAM: CT ANGIOGRAPHY CHEST WITH CONTRAST TECHNIQUE: Multidetector CT imaging of the chest was performed using the standard protocol during bolus administration of intravenous contrast. Multiplanar CT image reconstructions and MIPs were obtained to evaluate the vascular anatomy. CONTRAST:  80mL OMNIPAQUE IOHEXOL 350 MG/ML SOLN COMPARISON:  Chest radiograph July 04, 2020; chest CT December 23, 2019 FINDINGS: Cardiovascular: In comparison with prior CT angiogram chest from 2021, there has been resolution of pulmonary embolism. Currently no demonstrable pulmonary emboli. There is no thoracic aortic aneurysm or dissection. Scattered foci of calcification noted in visualized great vessels. There are foci of aortic atherosclerosis as well as multiple foci of coronary artery calcification. No evident pericardial effusion or pericardial thickening. Mediastinum/Nodes: Thyroid appears unremarkable. No appreciable thoracic adenopathy by size criteria. There is a subcentimeter lymph node immediately to the right of the carina anteriorly which does not meet size  criteria for pathologic significance. No esophageal lesions are appreciable. Lungs/Pleura: There is underlying centrilobular and paraseptal emphysematous change. There is chronic pleural thickening in the right base with parietal pleural calcification, stable. No free-flowing pleural effusions evident. There is mild scarring and atelectasis in the inferior lingula. There is mild atelectatic change in each lower lobe. There is no edema or airspace consolidation. No pneumothorax. Trachea and major bronchial structures appear normal. Upper Abdomen: There is upper abdominal aortic atherosclerosis. Visualized upper abdominal structures otherwise appear unremarkable. Musculoskeletal: There is degenerative change in the thoracic spine. No blastic or lytic bone lesions. No evident chest wall lesions. Review of the MIP images confirms the above findings. IMPRESSION: 1. No pulmonary emboli evident. Interval resolution of pulmonary emboli compared to prior study from September 2021. No thoracic aortic aneurysm or dissection. There is aortic atherosclerosis as well as foci of great vessel and coronary artery calcification. 2. Underlying emphysematous change. No edema or airspace opacity. Areas of mild scarring and atelectasis. Apparent posterior right base pleural thickening with parietal pleural calcification. Question prior asbestos exposure versus residua of prior empyema in this area. 3.  No evident adenopathy by size criteria. Aortic Atherosclerosis (ICD10-I70.0) and Emphysema (ICD10-J43.9). Electronically Signed   By: Lowella Grip III M.D.   On: 07/04/2020 13:20    ____________________________________________   PROCEDURES  Procedure(s) performed (including Critical Care):  Procedures   ____________________________________________   INITIAL IMPRESSION / ASSESSMENT AND PLAN / ED COURSE  As part of my medical decision making, I reviewed the following data within the [place]  reviewed elevated D-dimer, Notes from prior ED visits and [place] Controlled Substance Database    Differential includes, but is not limited to, viral syndrome, bronchitis including COPD exacerbation, pneumonia, reactive airway disease including asthma, CHF including exacerbation with or without pulmonary/interstitial edema, pneumothorax, ACS, thoracic trauma, and pulmonary embolism.    Patient with history of emphysema and chronic bronchitis, with recent history of bilateral pulmonary emboli, presents for evaluation of them for shortness of breath that has been stable during his course in the ED.  Labs are reassuring with exception of an elevated D-dimer, that was reevaluated with a CT chest angio.  The subsequent report shows complete resolution of previous identified emboli, when compared to 9/21.  No other acute abnormalities are appreciated.  Patient is reassured, and is demanding to leave the ED at this time.  He will be discharged with a prescription for his  apixaban, and encouraged to take the medication as directed.  Follow-up with primary provider or return to the ED if needed. ____________________________________________   FINAL CLINICAL IMPRESSION(S) / ED DIAGNOSES  Final diagnoses:  SOB (shortness of breath)  Pulmonary emphysema, unspecified emphysema type [facility])     ED Discharge Orders         Ordered    apixaban (ELIQUIS) 5 MG TABS tablet  2 times daily        07/04/20 1346          *Please note:  [name] was evaluated in Emergency Department on 07/04/2020 for the symptoms described in the history of present illness. He was evaluated in the context of the global COVID-19 pandemic, which necessitated consideration that the patient might be at risk for infection with the SARS-CoV-2 virus that causes COVID-19. Institutional protocols and algorithms that pertain to the evaluation of patients at risk for COVID-19 are in a state of rapid change based on information released by  regulatory bodies including the CDC and federal and state organizations. These policies and algorithms were followed during the patient's care in the ED.  Some ED evaluations and interventions may be delayed as a result of limited staffing during and the pandemic.*   Note:  This document was prepared using Dragon voice recognition software and may include unintentional dictation errors.    Melvenia Needles, PA-C 07/04/20 1354    Arta Silence, MD 07/04/20 1510

## 2020-07-04 NOTE — ED Notes (Signed)
Pt in NAD talking with provider. VS normal. A fib on monitor.

## 2020-07-04 NOTE — Telephone Encounter (Signed)
Copied from [place] [id number]. Topic: General - Other >> Jul 04, 2020  2:59 PM Tessa Lerner A wrote: Reason for CRM: Patient has made contact to thank Dr. [name] for the care provided to them  Patient expressed complete gratitude and appreciation and wanted to share their feelings with Dr. [name]

## 2020-07-10 ENCOUNTER — Other Ambulatory Visit: Payer: Self-pay

## 2020-07-10 ENCOUNTER — Ambulatory Visit (INDEPENDENT_AMBULATORY_CARE_PROVIDER_SITE_OTHER): Payer: Medicare HMO | Admitting: Internal Medicine

## 2020-07-10 ENCOUNTER — Encounter: Payer: Self-pay | Admitting: Internal Medicine

## 2020-07-10 VITALS — BP 118/80 | HR 56 | Temp 97.2°F | Ht 65.43 in | Wt 153.2 lb

## 2020-07-10 DIAGNOSIS — R079 Chest pain, unspecified: Secondary | ICD-10-CM

## 2020-07-10 NOTE — Progress Notes (Signed)
Ht 5' 5.43" (1.662 m)   Wt 153 lb 3.2 oz (69.5 kg)   BMI 25.16 kg/m    Subjective:    Patient ID: Brady Smith, male    DOB: [birth year], 75 y.o.   MRN: [id number]  HPI: Brady Smith is a 75 y.o. male  Pt is here to establish care, new to the practics has a ho Afib, ? CHF, ho bilateral Pe's , NOT TAKING ELIQUIS and presents with a purple / blusih discoloration to his left index finger. He c/o acute pain in the affected extremity and has been hurting per his verbal record x 1 week worsen x last night. He has a ho Raynauds per his verbal record.  Pt is als chest pain at this visit and shortness of breath. He had an EKG in the office which showed that he is in A. fib with a heart rate of 76.  Patient's pulse oximetry could not be taken was found to have an oxygen sat of 99% however towards the end of the visit. Pt didn't want to get his O2 at first because he couldn't breathe per nursing.  Kept asking for pain meds for his finger as he walked through the door at every step of todays visit.    Chief Complaint  Patient presents with  . New Patient (Initial Visit)  . Hand Pain    Left index finger pain, swollen, red x 1 week, pain got worse last night.     Relevant past medical, surgical, family and social history reviewed and updated as indicated. Interim medical history since our last visit reviewed. Allergies and medications reviewed and updated.  Review of Systems  Constitutional: Negative for activity change, appetite change, chills, fatigue and fever.  HENT: Negative for congestion.   Eyes: Negative for visual disturbance.  Respiratory: Positive for chest tightness and shortness of breath. Negative for apnea, cough and wheezing.   Cardiovascular: Positive for chest pain. Negative for palpitations and leg swelling.  Gastrointestinal: Negative for abdominal distention, abdominal pain, diarrhea and nausea.  Endocrine: Negative for cold intolerance, heat intolerance, polydipsia,  polyphagia and polyuria.  Genitourinary: Negative for difficulty urinating, dysuria, frequency, hematuria and urgency.  Musculoskeletal: Negative for arthralgias.  Skin: Positive for pallor. Negative for color change and rash.       Pale and purple finger tips along with purple discolroation of the DIP of the Rt hand  Neurological: Negative for speech difficulty, weakness, light-headedness, numbness and headaches.  Psychiatric/Behavioral: Positive for agitation.    Per HPI unless specifically indicated above     Objective:    Ht 5' 5.43" (1.662 m)   Wt 153 lb 3.2 oz (69.5 kg)   BMI 25.16 kg/m   Wt Readings from Last 3 Encounters:  07/10/20 153 lb 3.2 oz (69.5 kg)  07/04/20 160 lb (72.6 kg)  03/07/20 154 lb (69.9 kg)    Physical Exam Vitals and nursing note reviewed.  Constitutional:      General: He is not in acute distress.    Appearance: Normal appearance. He is not ill-appearing or diaphoretic.  HENT:     Right Ear: External ear normal.     Mouth/Throat:     Pharynx: No oropharyngeal exudate.  Cardiovascular:     Heart sounds: No friction rub. No gallop.      Comments: ireegularly irregular heart rate  Pulmonary:     Effort: No respiratory distress.  Musculoskeletal:        General: Tenderness present. No  swelling or deformity.     Right lower leg: No edema.     Left lower leg: No edema.     Comments: Purplish discoloration of the Rt index finger.   Skin:    General: Skin is warm and dry.     Coloration: Skin is not jaundiced.     Findings: No erythema.  Neurological:     Mental Status: He is alert.     Results for orders placed or performed during the hospital encounter of 07/04/20  D-dimer, quantitative  Result Value Ref Range   D-Dimer, Quant 0.68 (H) 0.00 - 0.50 ug/mL-FEU  Basic metabolic panel  Result Value Ref Range   Sodium 141 135 - 145 mmol/L   Potassium 4.3 3.5 - 5.1 mmol/L   Chloride 107 98 - 111 mmol/L   CO2 24 22 - 32 mmol/L   Glucose, Bld  133 (H) 70 - 99 mg/dL   BUN 25 (H) 8 - 23 mg/dL   Creatinine, Ser 1.12 0.61 - 1.24 mg/dL   Calcium 9.5 8.9 - 10.3 mg/dL   GFR, Estimated >60 >60 mL/min   Anion gap 10 5 - 15  CBC with Differential  Result Value Ref Range   WBC 7.8 4.0 - 10.5 K/uL   RBC 5.79 4.22 - 5.81 MIL/uL   Hemoglobin 17.4 (H) 13.0 - 17.0 g/dL   HCT 52.1 (H) 39.0 - 52.0 %   MCV 90.0 80.0 - 100.0 fL   MCH 30.1 26.0 - 34.0 pg   MCHC 33.4 30.0 - 36.0 g/dL   RDW 14.2 11.5 - 15.5 %   Platelets 161 150 - 400 K/uL   nRBC 0.0 0.0 - 0.2 %   Neutrophils Relative % 76 %   Neutro Abs 5.9 1.7 - 7.7 K/uL   Lymphocytes Relative 17 %   Lymphs Abs 1.3 0.7 - 4.0 K/uL   Monocytes Relative 6 %   Monocytes Absolute 0.4 0.1 - 1.0 K/uL   Eosinophils Relative 0 %   Eosinophils Absolute 0.0 0.0 - 0.5 K/uL   Basophils Relative 0 %   Basophils Absolute 0.0 0.0 - 0.1 K/uL   Immature Granulocytes 1 %   Abs Immature Granulocytes 0.04 0.00 - 0.07 K/uL  Troponin I (High Sensitivity)  Result Value Ref Range   Troponin I (High Sensitivity) 4 <18 ng/L        Current Outpatient Medications:  .  albuterol (VENTOLIN HFA) 108 (90 Base) MCG/ACT inhaler, Inhale 2 puffs into the lungs every 6 (six) hours as needed for wheezing or shortness of breath., Disp: 8.5 g, Rfl: 5 .  apixaban (ELIQUIS) 5 MG TABS tablet, Take 1 tablet (5 mg total) by mouth 2 (two) times daily., Disp: 180 tablet, Rfl: 4 .  apixaban (ELIQUIS) 5 MG TABS tablet, Take 1 tablet (5 mg total) by mouth 2 (two) times daily., Disp: 60 tablet, Rfl: 3    Assessment & Plan:  1. Purple discoloration of finger ? Sec to Peripheral Embolus to such vs infectious etiology will need further wu in the Hospital setting.  EKG shows he is in Afib ? Causing his Chest pain vs another PE since he isnt taking Eliquis.  He was advised to go to the ER to take care of his possible embolus and Chest pain / SOB which I think is secondary to afib induced emboli vs recurrent Pulmonary embolus.  Pt  advised to go to the Emergency room for further evaluation and treatment of his Chest pain / Sob  and acute purplish discoloration of his peripheral fingers /hands . Pt declines doing this and says he might go back to his last pcp. Advised pt strongly to go to the ER for acute management of his medical problems. He was very rude and declined doing this despite explaining to him that he could have recurrent PE's / emboli in his extremities and that this could be a life threatening emergency and needs urgent imaging and intervention.  The pt however was focused only on pain in his fingers and demanded that he be given pain medications for his finger.  His behavior to multiple staff members and myself was very disrespectful. His questions to the staff were pointed to my ethnicity and my country of origin and my religious beliefs. pts overall demeanor throughout thisi encounter was rude and left me feeling uncomfortable.     Problem List Items Addressed This Visit   None   Visit Diagnoses    Chest pain, unspecified type    -  Primary   Relevant Orders   EKG 12-Lead (Completed)       Follow up plan: No follow-ups on file.

## 2020-07-11 ENCOUNTER — Telehealth: Payer: Self-pay

## 2020-07-11 ENCOUNTER — Encounter: Payer: Self-pay | Admitting: Internal Medicine

## 2020-07-11 ENCOUNTER — Encounter: Payer: Self-pay | Admitting: Emergency Medicine

## 2020-07-11 ENCOUNTER — Emergency Department
Admission: EM | Admit: 2020-07-11 | Discharge: 2020-07-11 | Disposition: A | Discharge status: Home / Self Care | Payer: Medicare HMO | Attending: Emergency Medicine | Admitting: Emergency Medicine

## 2020-07-11 ENCOUNTER — Other Ambulatory Visit: Payer: Self-pay

## 2020-07-11 ENCOUNTER — Ambulatory Visit (INDEPENDENT_AMBULATORY_CARE_PROVIDER_SITE_OTHER): Payer: Medicare HMO | Admitting: Internal Medicine

## 2020-07-11 VITALS — BP 140/96 | HR 82 | Temp 98.1°F | Ht 65.0 in | Wt 153.0 lb

## 2020-07-11 DIAGNOSIS — F419 Anxiety disorder, unspecified: Secondary | ICD-10-CM

## 2020-07-11 DIAGNOSIS — R69 Illness, unspecified: Secondary | ICD-10-CM | POA: Diagnosis not present

## 2020-07-11 DIAGNOSIS — M79645 Pain in left finger(s): Secondary | ICD-10-CM | POA: Diagnosis not present

## 2020-07-11 DIAGNOSIS — Z7901 Long term (current) use of anticoagulants: Secondary | ICD-10-CM | POA: Insufficient documentation

## 2020-07-11 DIAGNOSIS — L299 Pruritus, unspecified: Secondary | ICD-10-CM

## 2020-07-11 DIAGNOSIS — Z85528 Personal history of other malignant neoplasm of kidney: Secondary | ICD-10-CM | POA: Diagnosis not present

## 2020-07-11 DIAGNOSIS — J441 Chronic obstructive pulmonary disease with (acute) exacerbation: Secondary | ICD-10-CM | POA: Diagnosis not present

## 2020-07-11 DIAGNOSIS — L03011 Cellulitis of right finger: Secondary | ICD-10-CM | POA: Diagnosis not present

## 2020-07-11 DIAGNOSIS — I10 Essential (primary) hypertension: Secondary | ICD-10-CM | POA: Insufficient documentation

## 2020-07-11 DIAGNOSIS — F17210 Nicotine dependence, cigarettes, uncomplicated: Secondary | ICD-10-CM | POA: Insufficient documentation

## 2020-07-11 DIAGNOSIS — L03012 Cellulitis of left finger: Secondary | ICD-10-CM

## 2020-07-11 DIAGNOSIS — G6289 Other specified polyneuropathies: Secondary | ICD-10-CM | POA: Diagnosis not present

## 2020-07-11 DIAGNOSIS — R739 Hyperglycemia, unspecified: Secondary | ICD-10-CM | POA: Diagnosis not present

## 2020-07-11 LAB — BAYER DCA HB A1C WAIVED: HB A1C (BAYER DCA - WAIVED): 5.3 % (ref <7.0)

## 2020-07-11 MED ORDER — OXYCODONE-ACETAMINOPHEN 5-325 MG PO TABS: 1.0000 | ORAL_TABLET | Freq: Once | ORAL | Status: DC

## 2020-07-11 MED ORDER — SULFAMETHOXAZOLE-TRIMETHOPRIM 800-160 MG PO TABS: 1.0000 | ORAL_TABLET | Freq: Two times a day (BID) | ORAL | 0 refills | Status: AC

## 2020-07-11 MED ORDER — GABAPENTIN 600 MG PO TABS: 600.0000 mg | ORAL_TABLET | Freq: Every day | ORAL | 1 refills | Status: DC

## 2020-07-11 MED ORDER — CITALOPRAM HYDROBROMIDE 10 MG PO TABS: 10.0000 mg | ORAL_TABLET | Freq: Every day | ORAL | 0 refills | Status: DC

## 2020-07-11 NOTE — Telephone Encounter (Signed)
Patient called our clinic to discuss chest pain that he has yesterday. Michela Pitcher he is still having complaints. Was seen yesterday by [facility] to establish care and was told he is no longer a patient in our clinic since he established at another office.   ( See notes- he also called to thank [place] for the care we gave him as though he was no longer coming here)  Please advise patient on what to do. Patient is complaining of chest pain and abdominal pain with hand pain.

## 2020-07-11 NOTE — Telephone Encounter (Signed)
Received message from [facility] with [facility], where patient was previously established. She states that the patient contacted their clinic this morning complaining of chest pain. Patient was advised to contact our clinic as he established care with [place] yesterday.   Patient currently at ER according to chart review.   Routing to provider as an Pharmacist, hospital.

## 2020-07-11 NOTE — ED Notes (Signed)
Pt given D/C papers, states he needs pain medication, this RN explained that due to patient driving himself unable to be given pain medication. Pt states he will find a ride, this RN explained patient will have to wait in room until his ride arrives, pt states he "ain't gonna wait around for that bullshit". Pt visualized ambulatory to the lobby at this time. EDP aware and at bedside speaking with patient at this time.

## 2020-07-11 NOTE — ED Notes (Signed)
Pt c/o pain to L 2nd digit digit x several days, pt states pus coming out at this time. Pt with some redness noted around the fingernail of the L 2nd digit.

## 2020-07-11 NOTE — Progress Notes (Unsigned)
BP (!) 140/96   Pulse 82   Temp 98.1 F (36.7 C) (Oral)   Wt 153 lb (69.4 kg)   SpO2 99%   BMI 25.46 kg/m    Subjective:    Patient ID: Brady Smith, male    DOB: 1945-04-09, 75 y.o.   MRN: [id number]  HPI: Brady Smith is a 75 y.o. male  Pt is back here from the ER, has a pmh of COPD, smoked since he was 75 yrs old 19 yrs of such, smokes x 2 ppd. 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  Heart Problem This is a chronic (has chronic atrial fibrillation) problem. Episode onset: is on eliquis for such     No chief complaint on file.   Relevant past medical, surgical, family and social history reviewed and updated as indicated. Interim medical history since our last visit reviewed. Allergies and medications reviewed and updated.  Review of Systems  Respiratory: Positive for shortness of breath.     Per HPI unless specifically indicated above     Objective:    BP (!) 140/96   Pulse 82   Temp 98.1 F (36.7 C) (Oral)   Wt 153 lb (69.4 kg)   SpO2 99%   BMI 25.46 kg/m   Wt Readings from Last 3 Encounters:  07/11/20 153 lb (69.4 kg)  07/11/20 153 lb (69.4 kg)  07/10/20 153 lb 3.2 oz (69.5 kg)    Physical Exam  Results for orders placed or performed during the hospital encounter of 07/04/20  D-dimer, quantitative  Result Value Ref Range   D-Dimer, Quant 0.68 (H) 0.00 - 0.50 ug/mL-FEU  Basic metabolic panel  Result Value Ref Range   Sodium 141 135 - 145 mmol/L   Potassium 4.3 3.5 - 5.1 mmol/L   Chloride 107 98 - 111 mmol/L   CO2 24 22 - 32 mmol/L   Glucose, Bld 133 (H) 70 - 99 mg/dL   BUN 25 (H) 8 - 23 mg/dL   Creatinine, Ser 1.12 0.61 - 1.24 mg/dL   Calcium 9.5 8.9 - 10.3 mg/dL   GFR, Estimated >60 >60 mL/min   Anion gap 10 5 -  15  CBC with Differential  Result Value Ref Range   WBC 7.8 4.0 - 10.5 K/uL   RBC 5.79 4.22 - 5.81 MIL/uL   Hemoglobin 17.4 (H) 13.0 - 17.0 g/dL   HCT 52.1 (H) 39.0 - 52.0 %   MCV 90.0 80.0 - 100.0 fL   MCH 30.1 26.0 - 34.0 pg   MCHC 33.4 30.0 - 36.0 g/dL   RDW 14.2 11.5 - 15.5 %   Platelets 161 150 - 400 K/uL   nRBC 0.0 0.0 - 0.2 %   Neutrophils Relative % 76 %   Neutro Abs 5.9 1.7 - 7.7 K/uL   Lymphocytes Relative 17 %   Lymphs Abs 1.3 0.7 - 4.0 K/uL   Monocytes Relative 6 %   Monocytes Absolute 0.4 0.1 - 1.0 K/uL   Eosinophils Relative 0 %   Eosinophils Absolute 0.0 0.0 - 0.5 K/uL   Basophils Relative 0 %   Basophils Absolute 0.0 0.0 - 0.1 K/uL   Immature Granulocytes 1 %   Abs Immature Granulocytes 0.04 0.00 - 0.07  K/uL  Troponin I (High Sensitivity)  Result Value Ref Range   Troponin I (High Sensitivity) 4 <18 ng/L        Current Outpatient Medications:  .  albuterol (VENTOLIN HFA) 108 (90 Base) MCG/ACT inhaler, Inhale 2 puffs into the lungs every 6 (six) hours as needed for wheezing or shortness of breath., Disp: 8.5 g, Rfl: 5 .  apixaban (ELIQUIS) 5 MG TABS tablet, Take 1 tablet (5 mg total) by mouth 2 (two) times daily., Disp: 180 tablet, Rfl: 4 .  apixaban (ELIQUIS) 5 MG TABS tablet, Take 1 tablet (5 mg total) by mouth 2 (two) times daily., Disp: 60 tablet, Rfl: 3    Assessment & Plan:  Severe chronic COPD : only on albuterol Inhaler.  Pt says " he feels he knows his body better than anyone and feels medications have SE.      Problem List Items Addressed This Visit   None      Follow up plan: No follow-ups on file.

## 2020-07-11 NOTE — ED Triage Notes (Signed)
Patient to ER for c/o pain to left 2nd finger. Patient states he was seen recently for blood clot and told he might have one in his finger.

## 2020-07-11 NOTE — ED Provider Notes (Signed)
[facility] Emergency Department Provider Note  Time seen: 11:41 AM  I have reviewed the triage vital signs and the nursing notes.   HISTORY  Chief Complaint Hand Pain   HPI Brady Smith is a 75 y.o. male with a past medical history COPD, gastric reflux, past pulmonary embolism now on Eliquis who presents to the emergency department for left index finger pain.  According to the patient for the past 3 days he has noted pain to his left index finger and now has small amount of pus draining.  Patient states he went to his doctor and she grabbed the finger which caused a lot of pain so he left the doctor and came here.  He states that he was told by her that it could be a blood clot.  Overall the patient appears well.  Denies any fever.  Does take chronic chest pain and abdominal pain but denies any acute discomfort.  States he was diagnosed with neuropathy and wants pain medication for this.   Patient states his doctor refuses to give him pain medication.  Past Medical History:  Diagnosis Date  . Acute on chronic respiratory failure with hypoxia ([place]) 12/23/2019  . Acute respiratory failure with hypoxia ([place]) 12/23/2019  . Anxiety   . Bladder cancer ([place])   . CAP (community acquired pneumonia) 12/04/2019  . COPD (chronic obstructive pulmonary disease) ([place])   . COPD exacerbation ([place]) 06/11/2011   Overview:  Overview:  Followed in Pulmonary clinic/ [facility]/ [place]    - HFA < 25% 06/10/11  Last Assessment & Plan:  Extremely poor hfa technique gives me hope that he really isn't that bad and needs to focus his attention on smoking cessation  I took an extended  opportunity with this patient to outline the consequences of continued cigarette use  in airway disorders based on all the dat  . Depression   . GERD (gastroesophageal reflux disease)   . History of kidney cancer   . Peripheral vascular disease ([place])   . Pneumonia     Patient Active Problem List    Diagnosis Date Noted  . Dyspepsia 01/18/2020  . Primary insomnia 01/18/2020  . COPD with acute exacerbation ([place]) 12/23/2019  . Pulmonary embolism ([place]) 12/23/2019  . Tobacco abuse 12/04/2019  . Dermatitis 11/24/2019  . Senile ecchymosis 08/02/2019  . B12 nutritional deficiency 06/07/2019  . Memory changes 04/24/2018  . Excoriation of scalp 04/24/2018  . Paroxysmal atrial fibrillation ([place]) 06/13/2017  . Elevated blood sugar 02/03/2017  . S/P amputation of lesser toe ([place]) 07/25/2015  . Peripheral vascular disease ([place]) 07/14/2015  . Toxic metabolic encephalopathy 04/02/3233  . AA (alcohol abuse) 05/23/2015  . SBO (small bowel obstruction) ([place]) 05/18/2015  . Urinary retention 05/18/2015  . Essential hypertension 05/18/2015  . Choroidal nevus, right eye 02/08/2015  . Chronic obstructive pulmonary disease ([place]) 02/08/2015  . Carpal tunnel syndrome 01/13/2015  . H/O gastrointestinal disease 01/13/2015  . History of primary malignant neoplasm of urinary bladder 01/13/2015  . Compulsive tobacco user syndrome 01/13/2015  . Nicotine addiction 01/13/2015  . HLD (hyperlipidemia) 01/13/2015  . Neurosis, phobic 01/13/2015  . Lung nodule, solitary 06/11/2011    Past Surgical History:  Procedure Laterality Date  . AMPUTATION Right 07/19/2015   Procedure: AMPUTATION DIGIT ( RIGHT FOOT FIFTH TOE );  Surgeon: Algernon Huxley, MD;  Location: [facility];  Service: Vascular;  Laterality: Right;  . BOWEL RESECTION  05/20/2015   Procedure: SMALL BOWEL RESECTION;  Surgeon:  Clayburn Pert, MD;  Location: [facility];  Service: General;;  . CATARACT EXTRACTION W/PHACO Left 10/06/2018   Procedure: CATARACT EXTRACTION PHACO AND INTRAOCULAR LENS PLACEMENT ([place])  LEFT;  Surgeon: Birder Robson, MD;  Location: [facility];  Service: Ophthalmology;  Laterality: Left;  DAY OF TEST. LIVES IN BOARDING HOUSE  . CATARACT EXTRACTION W/PHACO Right 10/27/2018   Procedure: CATARACT EXTRACTION PHACO AND INTRAOCULAR  LENS PLACEMENT ([place])  RIGHT;  Surgeon: Birder Robson, MD;  Location: [place] [facility];  Service: Ophthalmology;  Laterality: Right;  PT HAS TO HAVE COVID TEST MORNING OF LEAVE AT LAST PATIENT  . CHOLECYSTECTOMY    . COLONOSCOPY  2000  . CYSTECTOMY W/ CONTINENT DIVERSION  1996  . HERNIA REPAIR    . LAPAROTOMY N/A 05/20/2015   Procedure: EXPLORATORY LAPAROTOMY;  Surgeon: Clayburn Pert, MD;  Location: [facility];  Service: General;  Laterality: N/A;  . PERIPHERAL VASCULAR CATHETERIZATION N/A 07/03/2015   Procedure: Abdominal Aortogram w/Lower Extremity;  Surgeon: Algernon Huxley, MD;  Location: [place] [facility];  Service: Cardiovascular;  Laterality: N/A;  . PERIPHERAL VASCULAR CATHETERIZATION  07/03/2015   Procedure: Lower Extremity Intervention;  Surgeon: Algernon Huxley, MD;  Location: [place] [facility];  Service: Cardiovascular;;  . PROSTATECTOMY  1996  . PULMONARY THROMBECTOMY N/A 12/24/2019   Procedure: PULMONARY THROMBECTOMY / THROMBOLYSIS;  Surgeon: Katha Cabal, MD;  Location: [facility];  Service: Cardiovascular;  Laterality: N/A;  . TONSILLECTOMY      Prior to Admission medications   Medication Sig Start Date End Date Taking? Authorizing Provider  albuterol (VENTOLIN HFA) 108 (90 Base) MCG/ACT inhaler Inhale 2 puffs into the lungs every 6 (six) hours as needed for wheezing or shortness of breath. 12/12/19 01/11/20  Shahmehdi, Valeria Batman, MD  apixaban (ELIQUIS) 5 MG TABS tablet Take 1 tablet (5 mg total) by mouth 2 (two) times daily. 03/02/20   Glean Hess, MD  apixaban (ELIQUIS) 5 MG TABS tablet Take 1 tablet (5 mg total) by mouth 2 (two) times daily. 07/04/20 11/01/20  Menshew, Dannielle Karvonen, PA-C    Allergies  Allergen Reactions  . Influenza Vaccines     High blood pressure- had to be admitted  . Lipitor [Atorvastatin Calcium]     Muscle pain  . Covid-19 (Adenovirus) Vaccine Rash    Family History  Problem Relation Age of Onset  . Heart disease Mother    . Heart disease Father   . COPD Sister     Social History Social History   Tobacco Use  . Smoking status: Current Every Day Smoker    Packs/day: 0.25    Years: 65.00    Pack years: 16.25    Types: Cigarettes  . Smokeless tobacco: Never Used  . Tobacco comment: reduced # of packs smoked to 5-6 ciggs daily from 3 pks daily   Vaping Use  . Vaping Use: Never used  Substance Use Topics  . Alcohol use: Yes    Alcohol/week: 12.0 standard drinks    Types: 12 Cans of beer per week  . Drug use: No    Review of Systems Constitutional: Negative for fever. Cardiovascular: States chronic chest pain Respiratory: Negative for shortness of breath. Gastrointestinal: States chronic abdominal pain Genitourinary: Negative for urinary compaints Musculoskeletal: 3 days of left index finger pain Skin: Drainage around the left index finger nail. Neurological: Negative for headache All other ROS negative  ____________________________________________   PHYSICAL EXAM:  VITAL SIGNS: ED Triage Vitals  Enc  Vitals Group     BP 07/11/20 1044 129/85     Pulse Rate 07/11/20 1044 80     Resp 07/11/20 1044 18     Temp 07/11/20 1044 97.9 F (36.6 C)     Temp Source 07/11/20 1044 Oral     SpO2 07/11/20 1044 98 %     Weight 07/11/20 1045 153 lb (69.4 kg)     Height 07/11/20 1045 5\' 5"  (1.651 m)     Head Circumference --      Peak Flow --      Pain Score 07/11/20 1045 10     Pain Loc --      Pain Edu? --      Excl. in [place]? --     Constitutional: Alert and oriented. Well appearing and in no distress. Eyes: Normal exam ENT      Head: Normocephalic and atraumatic.      Mouth/Throat: Mucous membranes are moist. Cardiovascular: Normal rate, regular rhythm. Respiratory: Normal respiratory effort without tachypnea nor retractions. Breath sounds are clear Gastrointestinal: Soft and nontender. No distention.   Musculoskeletal: Patient does have erythema and induration surrounding the left index  finger nail with a small area of drainage. Neurologic:  Normal speech and language. No gross focal neurologic deficits Skin:  Skin is warm Psychiatric: Mood and affect are normal.     INITIAL IMPRESSION / ASSESSMENT AND PLAN / ED COURSE  Pertinent labs & imaging results that were available during my care of the patient were reviewed by me and considered in my medical decision making (see chart for details).   Patient presents emergency department for 3 days of discomfort and swelling to the distal left index finger.  Examination is consistent with paronychia patient has a small amount of drainage.  18-gauge needle was used along the fingernail with small amount of exudate drained from the paronychia.  Discussed with the patient Tylenol or ibuprofen for discomfort and follow-up with his primary care doctor in 2 days for recheck.  Patient became somewhat upset saying that he needs pain medication I agreed to give him a one-time dose of Percocet in the emergency department for acute discomfort after the procedure but after that I told the patient he would need to use Tylenol or ibuprofen.  Patient then threatened to go get heroin off the street and states he would never do heroin but his doctor will not write him for pain medication.  Discussed with the patient this is something that needs to be followed up with his primary care doctor.  Discussed return precautions from a paronychia standpoint.  Brady Smith was evaluated in Emergency Department on 07/11/2020 for the symptoms described in the history of present illness. He was evaluated in the context of the global COVID-19 pandemic, which necessitated consideration that the patient might be at risk for infection with the SARS-CoV-2 virus that causes COVID-19. Institutional protocols and algorithms that pertain to the evaluation of patients at risk for COVID-19 are in a state of rapid change based on information released by regulatory bodies including the  CDC and federal and state organizations. These policies and algorithms were followed during the patient's care in the ED.  ____________________________________________   FINAL CLINICAL IMPRESSION(S) / ED DIAGNOSES  Wayne Sever, MD 07/11/20 1144

## 2020-07-12 LAB — BASIC METABOLIC PANEL
BUN/Creatinine Ratio: 17 (ref 10–24)
BUN: 20 mg/dL (ref 8–27)
CO2: 13 mmol/L — ABNORMAL LOW (ref 20–29)
Calcium: 9.4 mg/dL (ref 8.6–10.2)
Chloride: 104 mmol/L (ref 96–106)
Creatinine, Ser: 1.15 mg/dL (ref 0.76–1.27)
Glucose: 86 mg/dL (ref 65–99)
Potassium: 4.6 mmol/L (ref 3.5–5.2)
Sodium: 137 mmol/L (ref 134–144)
eGFR: 67 mL/min/{1.73_m2} (ref >59)

## 2020-07-12 NOTE — Telephone Encounter (Signed)
Error

## 2020-07-14 LAB — HEPATIC FUNCTION PANEL
ALT: 9 IU/L (ref 0–44)
AST: 15 IU/L (ref 0–40)
Albumin: 5.1 g/dL — ABNORMAL HIGH (ref 3.7–4.7)
Alkaline Phosphatase: 80 IU/L (ref 44–121)
Bilirubin Total: <0.2 mg/dL (ref 0.0–1.2)
Bilirubin, Direct: <0.1 mg/dL (ref 0.00–0.40)
Total Protein: 8.3 g/dL (ref 6.0–8.5)

## 2020-07-14 LAB — SPECIMEN STATUS REPORT

## 2020-07-19 ENCOUNTER — Encounter: Payer: Self-pay | Admitting: Internal Medicine

## 2020-07-19 ENCOUNTER — Telehealth: Payer: Self-pay

## 2020-07-19 NOTE — Telephone Encounter (Signed)
Called to r/s 5/10 appt no answer vm not set up

## 2020-07-25 ENCOUNTER — Encounter: Payer: Self-pay | Admitting: Internal Medicine

## 2020-07-25 NOTE — Progress Notes (Signed)
Problem List Items Addressed This Visit   None   Visit Diagnoses    Paronychia of finger of right hand    -  Primary   Relevant Orders   Bayer DCA Hb A1c Waived (Completed)   Basic metabolic panel (Completed)   Itching       Relevant Medications   gabapentin (NEURONTIN) 600 MG tablet   Other Relevant Orders   Bilirubin, Direct   Hepatic function panel   Hepatic function panel (Completed)   Specimen status report (Completed)   Anxiety       Relevant Medications      BP (!) 140/96   Pulse 82   Temp 98.1 F (36.7 C) (Oral)   Ht 5' 5"  (1.651 m)   Wt 153 lb (69.4 kg)   SpO2 99%   BMI 25.46 kg/m    Subjective:    Patient ID: Brady Smith, male    DOB: [birth year], 75 y.o.   MRN: [id number]  HPI: Brady Smith is a 75 y.o. male  HPI  No chief complaint on file.   Relevant past medical, surgical, family and social history reviewed and updated as indicated. Interim medical history since our last visit reviewed. Allergies and medications reviewed and updated.  Review of Systems  Per HPI unless specifically indicated above     Objective:    BP (!) 140/96   Pulse 82   Temp 98.1 F (36.7 C) (Oral)   Ht 5' 5"  (1.651 m)   Wt 153 lb (69.4 kg)   SpO2 99%   BMI 25.46 kg/m   Wt Readings from Last 3 Encounters:  07/11/20 153 lb (69.4 kg)  07/11/20 153 lb (69.4 kg)  07/10/20 153 lb 3.2 oz (69.5 kg)    Physical Exam  Results for orders placed or performed in visit on 07/11/20  Bayer DCA Hb A1c Waived  Result Value Ref Range   HB A1C (BAYER DCA - WAIVED) 5.3 <9.2 %  Basic metabolic panel  Result Value Ref Range   Glucose 86 65 - 99 mg/dL   BUN 20 8 - 27 mg/dL   Creatinine, Ser 1.15 0.76 - 1.27 mg/dL   eGFR 67 >59 mL/min/1.73   BUN/Creatinine Ratio 17 10 - 24   Sodium 137 134 - 144 mmol/L   Potassium 4.6 3.5 - 5.2 mmol/L   Chloride 104 96 - 106 mmol/L   CO2 13 (L) 20 - 29 mmol/L   Calcium 9.4 8.6 - 10.2 mg/dL  Hepatic function panel  Result Value Ref Range    Total Protein 8.3 6.0 - 8.5 g/dL   Albumin 5.1 (H) 3.7 - 4.7 g/dL   Bilirubin Total <0.2 0.0 - 1.2 mg/dL   Bilirubin, Direct <0.10 0.00 - 0.40 mg/dL   Alkaline Phosphatase 80 44 - 121 IU/L   AST 15 0 - 40 IU/L   ALT 9 0 - 44 IU/L  Specimen status report  Result Value Ref Range   specimen status report Comment         Current Outpatient Medications:  .  citalopram (CELEXA) 10 MG tablet, Take 1 tablet (10 mg total) by mouth daily., Disp: 30 tablet, Rfl: 0 .  gabapentin (NEURONTIN) 600 MG tablet, Take 1 tablet (600 mg total) by mouth at bedtime., Disp: 60 tablet, Rfl: 1 .  albuterol (VENTOLIN HFA) 108 (90 Base) MCG/ACT inhaler, Inhale 2 puffs into the lungs every 6 (six) hours as needed for wheezing or shortness of breath., Disp: 8.5 g, Rfl:  5 .  apixaban (ELIQUIS) 5 MG TABS tablet, Take 1 tablet (5 mg total) by mouth 2 (two) times daily., Disp: 180 tablet, Rfl: 4 .  apixaban (ELIQUIS) 5 MG TABS tablet, Take 1 tablet (5 mg total) by mouth 2 (two) times daily., Disp: 60 tablet, Rfl: 3    Assessment & Plan:   Problem List Items Addressed This Visit   None   Visit Diagnoses    Paronychia of finger of right hand    -  Primary   Relevant Orders   Bayer DCA Hb A1c Waived (Completed)   Basic metabolic panel (Completed)   Itching       Relevant Medications   gabapentin (NEURONTIN) 600 MG tablet   Other Relevant Orders   Bilirubin, Direct   Hepatic function panel   Hepatic function panel (Completed)   Specimen status report (Completed)   Anxiety       Relevant Medications   citalopram (CELEXA) 10 MG tablet   Other polyneuropathy       Relevant Medications   citalopram (CELEXA) 10 MG tablet   gabapentin (NEURONTIN) 600 MG tablet       Follow up plan: Return in about 3 weeks (around 08/01/2020).    citalopram (CELEXA) 10 MG tablet   Other polyneuropathy       Relevant Medications   citalopram (CELEXA) 10 MG tablet   gabapentin (NEURONTIN) 600 MG tablet

## 2020-07-25 NOTE — Progress Notes (Signed)
Pt in a better mood from last visit, however doesn't want to see any specialists fro afib, doesn't want any new meds says : its his body and he knows what is good for his body ":  On multiple occasions, pt kept saying he used a chemical called " [employer]ILL[employer] " in his profession.

## 2020-07-27 ENCOUNTER — Telehealth: Payer: Self-pay | Admitting: Pharmacist

## 2020-07-27 ENCOUNTER — Ambulatory Visit (INDEPENDENT_AMBULATORY_CARE_PROVIDER_SITE_OTHER): Payer: Medicare HMO | Admitting: Pharmacist

## 2020-07-27 DIAGNOSIS — I10 Essential (primary) hypertension: Secondary | ICD-10-CM | POA: Diagnosis not present

## 2020-07-27 DIAGNOSIS — J449 Chronic obstructive pulmonary disease, unspecified: Secondary | ICD-10-CM | POA: Diagnosis not present

## 2020-07-27 DIAGNOSIS — I480 Paroxysmal atrial fibrillation: Secondary | ICD-10-CM

## 2020-07-27 DIAGNOSIS — J441 Chronic obstructive pulmonary disease with (acute) exacerbation: Secondary | ICD-10-CM

## 2020-07-27 NOTE — Telephone Encounter (Signed)
Patient would like Dr. Neomia Dear to know he previously received B12 injections monthly. He would like to have his  B12 level drawn with next labs to determine if he should resume injections.

## 2020-07-27 NOTE — Patient Instructions (Addendum)
Visit Information  It was a pleasure speaking with you today. Thank you for letting me be part of your clinical team. Please call with any questions or concerns.   Goals Addressed            This Visit's Progress   . Pharmacy Care Plan       CARE PLAN ENTRY (see longitudinal plan of care for additional care plan information)  Current Barriers:  . Chronic Disease Management support, education, and care coordination needs related to Hypertension, Atrial Fibrillation recent bilat PE, RLE DVT GERD, COPD, Tobacco use, and PVD. Financial barriers to Eliquis therapy.   Hypertension BP Readings from Last 3 Encounters:  07/11/20 (!) 140/96  07/11/20 129/85  07/10/20 118/80   . Pharmacist Clinical Goal(s): o Over the next 60 days, patient will work with PharmD and providers to achieve BP goal <140/90 . Current regimen:  o None . Interventions: o Provided diet and exercise counseling. o Encouraged patient to consider SMBP . Patient self care activities - Over the next 60 days, patient will: o Check BP at appointments document, and provide at future appointments o Ensure daily salt intake < 2300 mg/day  Afib/ Bilateral PE/RLE DVT . Pharmacist Clinical Goal(s) o Over the next 60 days, patient will work with PharmD and providers to eliminate financial barriers and maximize adherence. . Current regimen:  o Apixaban 5 mg bid . Interventions: o Reviewed fill history and assessed compliance  o Provided education on signs/symptoms of bleeding o Eliquis PAP completed and faxed. Will complete LIS once SS statement received by patient . Patient self care activities - Over the next 60 days, patient will: o Take medication as prescribed o Provide required portion of patient assistance documentation  Medication management . Pharmacist Clinical Goal(s): o Over the next 60 days, patient will work with PharmD and providers to achieve optimal medication adherence . Current pharmacy:  [facility] . Interventions o Comprehensive medication review performed. o Continue current medication management strategy . Patient self care activities - Over the next 60 days, patient will: o Focus on medication adherence by fill dates o Take medications as prescribed o Report any questions or concerns to PharmD and/or provider(s)  Please see past updates related to this goal by clicking on the "Past Updates" button in the selected goal      . Track and Manage Heart Rate and Rhythm-Atrial Fibrillation   Not on track    Timeframe:  Long-Range Goal Priority:  High Start Date:                             Expected End Date:                       Follow Up Date  2 month follow up    - make a plan to eat healthy - take medicine as prescribed    Why is this important?    Atrial fibrillation may have no symptoms. Sometimes the symptoms get worse or happen more often.   It is important to keep track of what your symptoms are and when they happen.   A change in symptoms is important to discuss with your doctor or nurse.   Being active and healthy eating will also help you manage your heart condition.     Notes:     . Track and Manage My Symptoms-COPD   Not on track    Timeframe:  Short-Term Goal Priority:  Medium Start Date:                             Expected End Date:                       Follow Up Date 2- 3 month follow up    - eliminate symptom triggers at home - keep follow-up appointments    Why is this important?    Tracking your symptoms and other information about your health helps your doctor plan your care.   Write down the symptoms, the time of day, what you were doing and what medicine you are taking.   You will soon learn how to manage your symptoms.     Notes:        The patient verbalized understanding of instructions, educational materials, and care plan provided today and declined offer to receive copy of patient instructions, educational  materials, and care plan.   Telephone follow up appointment with pharmacy team member scheduled for: 1 month  Junita Push. Kenton Kingfisher PharmD, BCPS Clinical Pharmacist [phone]  COPD and Physical Activity Chronic obstructive pulmonary disease (COPD) is a long-term (chronic) condition that affects the lungs. COPD is a general term that can be used to describe many different lung problems that cause lung swelling (inflammation) and limit airflow, including chronic bronchitis and emphysema. The main symptom of COPD is shortness of breath, which makes it harder to do even simple tasks. This can also make it harder to exercise and be active. Talk with your health care provider about treatments to help you breathe better and actions you can take to prevent breathing problems during physical activity. What are the benefits of exercising with COPD? Exercising regularly is an important part of a healthy lifestyle. You can still exercise and do physical activities even though you have COPD. Exercise and physical activity improve your shortness of breath by increasing blood flow (circulation). This causes your heart to pump more oxygen through your body. Moderate exercise can improve your:  Oxygen use.  Energy level.  Shortness of breath.  Strength in your breathing muscles.  Heart health.  Sleep.  Self-esteem and feelings of self-worth.  Depression, stress, and anxiety levels. Exercise can benefit everyone with COPD. The severity of your disease may affect how hard you can exercise, especially at first, but everyone can benefit. Talk with your health care provider about how much exercise is safe for you, and which activities and exercises are safe for you.   What actions can I take to prevent breathing problems during physical activity?  Sign up for a pulmonary rehabilitation program. This type of program may include: ? Education about lung diseases. ? Exercise classes that teach you how to  exercise and be more active while improving your breathing. This usually involves:  Exercise using your lower extremities, such as a stationary bicycle.  About 30 minutes of exercise, 2 to 5 times per week, for 6 to 12 weeks  Strength training, such as push ups or leg lifts. ? Nutrition education. ? Group classes in which you can talk with others who also have COPD and learn ways to manage stress.  If you use an oxygen tank, you should use it while you exercise. Work with your health care provider to adjust your oxygen for your physical activity. Your resting flow rate is different from your flow rate during physical activity.  While you are exercising: ? Take slow breaths. ? Pace yourself and do not try to go too fast. ? Purse your lips while breathing out. Pursing your lips is similar to a kissing or whistling position. ? If doing exercise that uses a quick burst of effort, such as weight lifting:  Breathe in before starting the exercise.  Breathe out during the hardest part of the exercise (such as raising the weights). Where to find support You can find support for exercising with COPD from:  Your health care provider.  A pulmonary rehabilitation program.  Your local health department or community health programs.  Support groups, online or in-person. Your health care provider may be able to recommend support groups. Where to find more information You can find more information about exercising with COPD from:  American Lung Association: ClassInsider.se.  COPD Foundation: https://www.rivera.net/. Contact a health care provider if:  Your symptoms get worse.  You have chest pain.  You have nausea.  You have a fever.  You have trouble talking or catching your breath.  You want to start a new exercise program or a new activity. Summary  COPD is a general term that can be used to describe many different lung problems that cause lung swelling (inflammation) and limit airflow.  This includes chronic bronchitis and emphysema.  Exercise and physical activity improve your shortness of breath by increasing blood flow (circulation). This causes your heart to provide more oxygen to your body.  Contact your health care provider before starting any exercise program or new activity. Ask your health care provider what exercises and activities are safe for you. This information is not intended to replace advice given to you by your health care provider. Make sure you discuss any questions you have with your health care provider. Document Revised: 07/01/2018 Document Reviewed: 04/03/2017 Elsevier Patient Education  2021 [employer].

## 2020-07-27 NOTE — Telephone Encounter (Signed)
Pt has apt on 07/28/2020 for lab

## 2020-07-27 NOTE — Progress Notes (Signed)
Chronic Care Management Pharmacy Note  07/27/2020 Name:  DAINE GUNTHER MRN:  [id number] DOB:  02-Sep-1945  Subjective: Brady Smith is an 75 y.o. year old male who is a primary patient of Vigg, Avanti, MD.  The CCM team was consulted for assistance with disease management and care coordination needs.    Engaged with patient face to face for follow up visit in response to provider referral for pharmacy case management and/or care coordination services.   Consent to Services:  The patient was given information about Chronic Care Management services, agreed to services, and gave verbal consent prior to initiation of services.  Please see initial visit note for detailed documentation.   Patient Care Team: Charlynne Cousins, MD as PCP - General (Internal Medicine) Kate Sable, MD as PCP - Cardiology (Cardiology) [place] (Ophthalmology) Birder Robson, MD as Referring Physician (Ophthalmology)  Recent office visits: 07/11/20- Vigg (PCP)-blood work, citalopram 10 mg qd, gabapentin 600 mg qd, bactrim ds bid x7d 07/10/20- Avanti Vigg (new PCP)- chest pain, purple finger, referred to ED  Recent consult visits: [facility] visits: 07/11/20- [facility] ED)- left index finger paronychia, drained precocet x 1 dose 07/04/20-Siadecki (ED)-SOB, ekg afib with pvcs, apixaban refills 9/11- 12/11/19- Pneumonia 9/30-10/04/21- PE, afib  Objective:  Lab Results  Component Value Date   CREATININE 1.15 07/11/2020   BUN 20 07/11/2020   GFRNONAA >60 07/04/2020   GFRAA >60 12/27/2019   NA 137 07/11/2020   K 4.6 07/11/2020   CALCIUM 9.4 07/11/2020   CO2 13 (L) 07/11/2020   GLUCOSE 86 07/11/2020    Lab Results  Component Value Date/Time   HGBA1C 5.3 07/11/2020 02:03 PM   HGBA1C 5.3 06/13/2017 11:52 AM   HGBA1C 5.3 03/06/2017 10:27 AM    Last diabetic Eye exam: No results found for: HMDIABEYEEXA  Last diabetic Foot exam: No results found for: HMDIABFOOTEX   Lab Results   Component Value Date   CHOL 159 08/02/2019   HDL 43 08/02/2019   LDLCALC 87 08/02/2019   TRIG 165 (H) 08/02/2019   CHOLHDL 3.7 08/02/2019    Hepatic Function Latest Ref Rng & Units 07/11/2020 12/23/2019 11/20/2019  Total Protein 6.0 - 8.5 g/dL 8.3 7.3 7.6  Albumin 3.7 - 4.7 g/dL 5.1(H) 3.4(L) 4.4  AST 0 - 40 IU/L 15 31 18   ALT 0 - 44 IU/L 9 31 14   Alk Phosphatase 44 - 121 IU/L 80 73 56  Total Bilirubin 0.0 - 1.2 mg/dL <0.2 1.1 1.1  Bilirubin, Direct 0.00 - 0.40 mg/dL <0.10 - -    Lab Results  Component Value Date/Time   TSH 1.100 08/02/2019 10:49 AM   TSH 1.530 11/11/2018 03:28 PM    CBC Latest Ref Rng & Units 07/04/2020 12/27/2019 12/26/2019  WBC 4.0 - 10.5 K/uL 7.8 5.3 5.0  Hemoglobin 13.0 - 17.0 g/dL 17.4(H) 10.6(L) 11.3(L)  Hematocrit 39.0 - 52.0 % 52.1(H) 33.0(L) 35.9(L)  Platelets 150 - 400 K/uL 161 145(L) 138(L)    No results found for: VD25OH  Clinical ASCVD: No  The 10-year ASCVD risk score Mikey Bussing DC Jr., et al., 2013) is: 28.6%   Values used to calculate the score:     Age: 41 years     Sex: Male     Is Non-Hispanic African American: No     Diabetic: No     Tobacco smoker: Yes     Systolic Blood Pressure: 681 mmHg     Is BP treated: No  HDL Cholesterol: 43 mg/dL     Total Cholesterol: 159 mg/dL    Depression screen [facility] 2/9 07/10/2020 03/07/2020 01/18/2020  Decreased Interest 0 0 0  Down, Depressed, Hopeless 0 0 1  PHQ - 2 Score 0 0 1  Altered sleeping 0 0 0  Tired, decreased energy 0 0 0  Change in appetite 0 0 0  Feeling bad or failure about yourself  0 0 0  Trouble concentrating 0 0 0  Moving slowly or fidgety/restless 0 0 0  Suicidal thoughts 0 0 0  PHQ-9 Score 0 0 1  Difficult doing work/chores - - Not difficult at all  Some recent data might be hidden     CHA2DS2/VAS Stroke Risk Points  Current as of 3 hours ago     3 >= 2 Points: High Risk  1 - 1.99 Points: Medium Risk  0 Points: Low Risk    No Change      Details    This score  determines the patient's risk of having a stroke if the  patient has atrial fibrillation.       Points Metrics  1 Has Congestive Heart Failure:  Yes    Current as of 3 hours ago  0 Has Vascular Disease:  No    Current as of 3 hours ago  1 Has Hypertension:  Yes    Current as of 3 hours ago  1 Age:  8    Current as of 3 hours ago  0 Has Diabetes:  No    Current as of 3 hours ago  0 Had Stroke:  No  Had TIA:  No  Had Thromboembolism:  No    Current as of 3 hours ago  0 Male:  No    Current as of 3 hours ago       Social History   Tobacco Use  Smoking Status Current Every Day Smoker  . Packs/day: 0.25  . Years: 65.00  . Pack years: 16.25  . Types: Cigarettes  Smokeless Tobacco Never Used  Tobacco Comment   reduced # of packs smoked to 5-6 ciggs daily from 3 pks daily    BP Readings from Last 3 Encounters:  07/11/20 (!) 140/96  07/11/20 129/85  07/10/20 118/80   Pulse Readings from Last 3 Encounters:  07/11/20 82  07/11/20 80  07/10/20 (!) 56   Wt Readings from Last 3 Encounters:  07/11/20 153 lb (69.4 kg)  07/11/20 153 lb (69.4 kg)  07/10/20 153 lb 3.2 oz (69.5 kg)   BMI Readings from Last 3 Encounters:  07/11/20 25.46 kg/m  07/11/20 25.46 kg/m  07/10/20 25.16 kg/m    Assessment/Interventions: Review of patient past medical history, allergies, medications, health status, including review of consultants reports, laboratory and other test data, was performed as part of comprehensive evaluation and provision of chronic care management services.   SDOH:  (Social Determinants of Health) assessments and interventions performed: Yes SDOH Interventions   Flowsheet Row Most Recent Value  SDOH Interventions   Financial Strain Interventions Other (Comment)  Jesse Fall, ]       Immunization History  Administered Date(s) Administered  . Influenza, High Dose Seasonal PF 01/23/2017  . PFIZER(Purple Top)SARS-COV-2 Vaccination 10/05/2019, 11/08/2019  .  Pneumococcal Conjugate-13 02/25/2018  . Pneumococcal Polysaccharide-23 01/24/2017    Conditions to be addressed/monitored:  Hypertension, Hyperlipidemia, Atrial Fibrillation, GERD and Tobacco use  Care Plan : CCM Pharmacy care plan  Updates made by Vladimir Faster, [place] since 07/27/2020  12:00 AM    Problem: COPD, A fib, Tobacco use   Priority: High    Goal: Disease management   Recent Progress: Not on track  Priority: High  Note:    Current Barriers:  . Unable to independently afford treatment regimen . Unable to independently monitor therapeutic efficacy . Unable to self administer medications as prescribed . Does not adhere to prescribed medication regimen . Does not maintain contact with provider office . Does not contact provider office for questions/concerns . Unstable social situation  Pharmacist Clinical Goal(s):  Marland Kitchen Patient will verbalize ability to afford treatment regimen . achieve adherence to monitoring guidelines and medication adherence to achieve therapeutic efficacy . adhere to plan to optimize therapeutic regimen for COPD, HTN, HLD as evidenced by report of adherence to recommended medication management changes . achieve ability to self administer medications as prescribed through use of pill packaging as evidenced by patient report . adhere to prescribed medication regimen as evidenced by fill dates . contact provider office for questions/concerns as evidenced notation of same in electronic health record through collaboration with PharmD and provider.  .   Interventions: . 1:1 collaboration with Glean Hess, MD regarding development and update of comprehensive plan of care as evidenced by provider attestation and co-signature . Inter-disciplinary care team collaboration (see longitudinal plan of care) . Comprehensive medication review performed; medication list updated in electronic medical record  Hypertension (BP goal <140/90) -Not ideally  controlled -Current treatment: . NONE -Medications previously tried: lisinopril  -Current home readings: not checking --Denies hypotensive/hypertensive symptoms -Educated on Exercise goal of 150 minutes per week; Symptoms of hypotension and importance of maintaining adequate hydration; -Counseled to monitor BP at home weekly, document, and provide log at future appointments -Recommended patient find a place to check his BP weekly in the community   Atrial Fibrillationh/o PE/ RLE DVT(Goal: prevent stroke and major bleeding) -Not ideally controlled -CHADSVASC: 3 -Current treatment: . Rate control: none . Anticoagulation: Eliquis -Medications previously tried: NA -Home BP and HR readings: none  -Counseled on increased risk of stroke due to Afib and benefits of anticoagulation for stroke prevention; importance of adherence to anticoagulant exactly as prescribed; bleeding risk associated with Eliquis and importance of self-monitoring for signs/symptoms of bleeding; avoidance of NSAIDs due to increased bleeding risk with anticoagulants; seeking medical attention after a head injury or if there is blood in the urine/stool; -Recommended to continue current medication Counseled on importance of continuing Eliquis without interuption Collaborated with 2-4 weekls Assessed patient finances. Eliquis patient assistance application has been submitted. Patient much meet out of pocket 3% of AGI. Per Manuela Neptune' records he has only filled Eliquin on 06/07/20 year to date. No other medications.  COPD (Goal: control symptoms and prevent exacerbations) -Not ideally controlled -Current treatment  . Albuterol prn -Medications previously tried: Davene Costain -Cat score-No flowsheet data found. Pulmonary function testing: Unknown -Exacerbations requiring treatment in last 6 months: Hospitalized 11/2019 with [facility] -Patient denies consistent use of maintenance inhaler -Frequency of rescue inhaler use: zero to  multiple times per day -Counseled on Benefits of consistent maintenance inhaler use Differences between maintenance and rescue inhalers -Counseled on smoking cessation Recommended patient start maintenance inhaler and refill albuterol. Unclear last fill. Assessed patient finances. Advised patient assistance programs are available for inhalers  Tobacco use (Goal cessation) - Patient not interested in cessation.   Patient Goals/Self-Care Activities . Patient will:  - check blood pressure weekly, document, and provide at future appointments collaborate with provider on medication  access solutions  Follow Up Plan: Telephone follow up appointment with care management team member scheduled for: 1 month CPA   Atrial Fibrillation / H/o PE/DVT(Goal: prevent stroke and major bleeding) -Not ideally controlled -CHADSVASC: CHA2DS2/VAS Stroke Risk Points  Current as of about an hour ago     3 >= 2 Points: High Risk  -Current treatment: . Rate control: None . Anticoagulation: Apixaban 5 mg bid -Medications previously tried: NA -Home BP and HR readings: does not check -Counseled on increased risk of stroke due to Afib and benefits of anticoagulation for stroke prevention; importance of adherence to anticoagulant exactly as prescribed; bleeding risk associated with Eliquis and importance of self-monitoring for signs/symptoms of bleeding; avoidance of NSAIDs due to increased bleeding risk with anticoagulants; importance of regular laboratory monitoring; seeking medical attention after a head injury or if there is blood in the urine/stool; -Recommended to continue current medication Counseled on importance of taking Eliquis as prescribed ~every 12 hours to keep levels therapeutic Assessed patient finances. Patietn recently switched to [employer] and states his copayments at [employer] are acceptable but he can not recall exact price. Have completed PAP for Eliquis and will submit once patient meets out  of pocket requirements.  Patient reports difficult living situation at current boarding house. Will consult with LCSW for resources and possible referral to community care guides.  Health Maintenance history of B12 Deficiency -Vaccine gaps: Covid booster -Current therapy:  . Tylenol 650 mg prn -Educated on keeping OTC meds in original container for identification. I identified tablets in an old Eliquis bottle as Tylenol extra strength today. -Patient would like new PCP to check B12 level as he was previously on monthly injections. Last level in chart from 2020. -Counseled on diet and exercise extensively Collaborated with new PCP to check B12 level with next labs. GAD 7 : Generalized Anxiety Score 07/10/2020 03/07/2020 01/18/2020 01/03/2020  Nervous, Anxious, on Edge 0 0 0 0  Control/stop worrying 0 0 1 0  Worry too much - different things 0 0 1 0  Trouble relaxing 0 0 0 0  Restless 0 0 0 0  Easily annoyed or irritable 0 0 0 0  Afraid - awful might happen 0 0 0 0  Total GAD 7 Score 0 0 2 0  Anxiety Difficulty - - Not difficult at all Not difficult at all    [facility] SCORE ONLY 07/10/2020 03/07/2020 01/18/2020  PHQ-9 Total Score 0 0 1    Depression/Anxiety (Goal: reduced symptoms) -Controlled -Current treatment: . Citalopram 10 mg qd -Medications previously tried/failed: na -PHQ9: 0 -GAD7: 0 -Connected with LCSW for mental health support -Educated on Benefits of medication for symptom control -Recommended to continue current medication Recommended patient follow up with PCP at 5/19 appointment          Medication Assistance: Application for Eliquis  medication assistance program. in process.  Anticipated assistance start date awaiting approval. Patient must meet 3% out of pocket expense requirement..  See plan of care for additional detail.  Patient's preferred pharmacy is:  [facility] [place]  [address] Phone: [phone] Fax: [place], [place] [address] [place] [address] Phone: [phone] Fax: [place], [place] [address] Phone: [phone] Fax: [phone]  Uses pill box? No - "don't like  fooling with that stuff" Pt endorses 100 % compliance to Eliquis since he had it filled 06/07/20.  We discussed: Benefits of medication synchronization, packaging and delivery as well as enhanced pharmacist oversight with [employer]. Patient decided to: Continue current medication management strategy  Is now using [place] drug  Care Plan and Follow Up Patient Decision:  Patient agrees to Care Plan and Follow-up.  Plan: Telephone follow up appointment with care management team member scheduled for:  one month CPA, 2 month PharmD  Junita Push. Kenton Kingfisher PharmD, BCPS Clinical Pharmacist [facility] [phone]  Current Barriers:  . Unable to independently monitor therapeutic efficacy . Unable to achieve control of afib, copd, hld, htn  . Unable to self administer medications as prescribed . Does not adhere to prescribed medication regimen . Does not maintain contact with provider office  Pharmacist Clinical Goal(s):  Marland Kitchen Patient will achieve adherence to monitoring guidelines and medication adherence to achieve therapeutic efficacy . achieve control of afib,neuropathy, htn as evidenced by lab values . maintain control of afib, neuropathy as evidenced by patient report, medication adherence   . achieve ability to self administer medications as prescribed through use of pill box, auto fill as evidenced by patient report . adhere to prescribed medication regimen as evidenced by fill dates . contact provider office for questions/concerns as evidenced notation of same in electronic health record  through collaboration with PharmD and provider.   Interventions: . 1:1 collaboration with Charlynne Cousins, MD regarding development and update of comprehensive plan of care as evidenced by provider attestation and co-signature . Inter-disciplinary care team collaboration (see longitudinal plan of care) . Comprehensive medication review performed; medication list updated in electronic medical record  Hypertension (BP goal <130/80) -Controlled -Current treatment: . None -Medications previously tried: amlodipine, lisinopril, metoprolol -Current home readings: does not check -Current dietary habits: Poor diet, eats fast food, no dietary restrictions -Current exercise habits: None -Denies hypotensive/hypertensive symptoms -Educated on Daily salt intake goal < 2300 mg; Exercise goal of 150 minutes per week; -Counseled to monitor BP at office visits and weekly as possible, document, and provide log at future appointments -Counseled on diet and exercise extensively Recommended patient find a pharmacy to check bp  Lab Results  Component Value Date   LDLCALC 87 08/02/2019   Lab Results  Component Value Date   CHOL 159 08/02/2019   HDL 43 08/02/2019   LDLCALC 87 08/02/2019   TRIG 165 (H) 08/02/2019   CHOLHDL 3.7 08/02/2019      Tobacco use (Goal cessation) -Uncontrolled -Previous quit attempts: NONE -Current treatment  . None  -On a scale of 1-10, reports MOTIVATION to quit is 0 -On a scale of 1-10, reports CONFIDENCE in quitting is 0 -Provided contact information for [place] Quit Line (1-800-QUIT-NOW) and encouraged patient to reach out to this group for support. -Recommended patient cut back on cigarettes. He voiced no interest in quitting and had  ~10-12 packs of cigarettes in his bag with him at  appointment today.    Patient Goals/Self-Care Activities . Patient will:  - take medications as prescribed focus on medication adherence by pill box  Follow Up Plan: Telephone follow  up appointment with care management team member scheduled for: 1 month CPA, 2-3 month PharmD

## 2020-07-28 ENCOUNTER — Other Ambulatory Visit: Payer: Medicare HMO

## 2020-07-28 ENCOUNTER — Other Ambulatory Visit: Payer: Self-pay

## 2020-07-28 DIAGNOSIS — Z8639 Personal history of other endocrine, nutritional and metabolic disease: Secondary | ICD-10-CM | POA: Diagnosis not present

## 2020-07-29 LAB — VITAMIN B12: Vitamin B-12: 294 pg/mL (ref 232–1245)

## 2020-08-01 ENCOUNTER — Telehealth: Payer: Self-pay

## 2020-08-01 ENCOUNTER — Ambulatory Visit: Payer: Medicare HMO | Admitting: Internal Medicine

## 2020-08-01 DIAGNOSIS — Z599 Problem related to housing and economic circumstances, unspecified: Secondary | ICD-10-CM

## 2020-08-01 NOTE — Telephone Encounter (Signed)
Have placed emergent referral to [facility] and connected care for assist.  Please alert patient and alert him they will call on his telephone, so please answer all calls.

## 2020-08-01 NOTE — Telephone Encounter (Signed)
Pt aware that he will be receiving a call from team and will try to answer all calls pt stated his landlord only gave him 7 days to leave and stated he feels due to his health he shouldn't live in his car.Pt stated he was appreciative for our help

## 2020-08-01 NOTE — Telephone Encounter (Signed)
Copied from [place] [id number]. Topic: General - Other >> Aug 01, 2020  2:28 PM Alanda Slim E wrote: Reason for CRM: Pt was advised to call office for assistance with a place to live/ pt is currently renting and home is being sold/ they have given him no time to move, find a place or get his stuff out of the home/ pt asked for urgent help/ please advise asap / pt states its urgent

## 2020-08-03 ENCOUNTER — Ambulatory Visit: Payer: Self-pay | Admitting: Licensed Clinical Social Worker

## 2020-08-03 DIAGNOSIS — J449 Chronic obstructive pulmonary disease, unspecified: Secondary | ICD-10-CM

## 2020-08-03 DIAGNOSIS — I10 Essential (primary) hypertension: Secondary | ICD-10-CM

## 2020-08-03 DIAGNOSIS — Z7189 Other specified counseling: Secondary | ICD-10-CM

## 2020-08-04 ENCOUNTER — Telehealth: Payer: Self-pay | Admitting: Licensed Clinical Social Worker

## 2020-08-04 NOTE — Telephone Encounter (Signed)
    Clinical Social Work  Chronic Care Management   Phone Outreach    08/04/2020 Name: OLAJUWON FOSDICK MRN: [id number] DOB: 12-29-1945  [name] Daigle is a 75 y.o. year old male who is a primary care patient of Vigg, Avanti, MD .   F/U phone call today to assess needs, and progress with care plan goals.   Telephone outreach was unsuccessful  Plan:CCM LCSW will wait for return call. If no return call is received, Will reach out to patient again in the next 7 days .   Review of patient status, including review of consultants reports, relevant laboratory and other test results, and collaboration with appropriate care team members and the patient's provider was performed as part of comprehensive patient evaluation and provision of care management services.    Christa See, MSW, [email or web address] Phone [phone] 4:29 PM

## 2020-08-07 ENCOUNTER — Ambulatory Visit: Payer: Medicare HMO | Admitting: Licensed Clinical Social Worker

## 2020-08-07 DIAGNOSIS — I10 Essential (primary) hypertension: Secondary | ICD-10-CM

## 2020-08-07 DIAGNOSIS — Z789 Other specified health status: Secondary | ICD-10-CM

## 2020-08-07 DIAGNOSIS — J441 Chronic obstructive pulmonary disease with (acute) exacerbation: Secondary | ICD-10-CM

## 2020-08-07 DIAGNOSIS — Z7189 Other specified counseling: Secondary | ICD-10-CM
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
## 2020-08-07 NOTE — Patient Instructions (Signed)
Visit Information  Goals Addressed            This Visit's Progress   . Obtain safe and Affordable Housing       Timeframe:  Long-Range Goal Priority:  High Start Date:  08/03/20                           Expected End Date:  11/22/20                     Follow Up Date 08/07/20   Patient Goals/Self-Care Activities: Over the next 120 days . Follow up on housing resources discussed . Attend all scheduled provider appointments . Contact Law Enforcement as discussed with any safety concerns       Patient verbalizes understanding of instructions provided today.   Telephone follow up appointment with care management team member scheduled for:08/07/20  Christa See, MSW, [email or web address] Phone [phone] 11:08 AM
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
## 2020-08-08 NOTE — Patient Instructions (Signed)
Visit Information  Goals Addressed   None     Patient verbalizes understanding of instructions provided today.   Telephone follow up appointment with care management team member scheduled for:08/10/20  Christa See, MSW, [email or web address] Phone [phone] 9:51 AM

## 2020-08-10 ENCOUNTER — Encounter: Payer: Self-pay | Admitting: Internal Medicine

## 2020-08-10 ENCOUNTER — Ambulatory Visit: Payer: Medicare HMO | Admitting: Licensed Clinical Social Worker

## 2020-08-10 ENCOUNTER — Ambulatory Visit (INDEPENDENT_AMBULATORY_CARE_PROVIDER_SITE_OTHER): Payer: Medicare HMO | Admitting: Internal Medicine

## 2020-08-10 VITALS — BP 154/81 | HR 89 | Temp 98.0°F | Ht 65.0 in | Wt 154.4 lb

## 2020-08-10 DIAGNOSIS — J441 Chronic obstructive pulmonary disease with (acute) exacerbation: Secondary | ICD-10-CM

## 2020-08-10 DIAGNOSIS — I4891 Unspecified atrial fibrillation: Secondary | ICD-10-CM | POA: Diagnosis not present

## 2020-08-10 DIAGNOSIS — F419 Anxiety disorder, unspecified: Secondary | ICD-10-CM

## 2020-08-10 DIAGNOSIS — L299 Pruritus, unspecified: Secondary | ICD-10-CM

## 2020-08-10 DIAGNOSIS — Z7189 Other specified counseling: Secondary | ICD-10-CM

## 2020-08-10 DIAGNOSIS — G6289 Other specified polyneuropathies: Secondary | ICD-10-CM | POA: Diagnosis not present

## 2020-08-10 DIAGNOSIS — R69 Illness, unspecified: Secondary | ICD-10-CM | POA: Diagnosis not present

## 2020-08-10 DIAGNOSIS — I10 Essential (primary) hypertension: Secondary | ICD-10-CM

## 2020-08-10 DIAGNOSIS — J449 Chronic obstructive pulmonary disease, unspecified: Secondary | ICD-10-CM | POA: Diagnosis not present

## 2020-08-10 DIAGNOSIS — I480 Paroxysmal atrial fibrillation: Secondary | ICD-10-CM | POA: Diagnosis not present

## 2020-08-10 MED ORDER — CITALOPRAM HYDROBROMIDE 10 MG PO TABS: 10.0000 mg | ORAL_TABLET | Freq: Every day | ORAL | 3 refills | Status: DC

## 2020-08-10 MED ORDER — GABAPENTIN 600 MG PO TABS: 600.0000 mg | ORAL_TABLET | Freq: Every day | ORAL | 4 refills | Status: DC

## 2020-08-10 MED ORDER — ALBUTEROL SULFATE HFA 108 (90 BASE) MCG/ACT IN AERS: 2.0000 | INHALATION_SPRAY | Freq: Four times a day (QID) | RESPIRATORY_TRACT | 5 refills | Status: DC | PRN

## 2020-08-10 NOTE — Progress Notes (Signed)
BP (!) 154/81   Pulse 89   Temp 98 F (36.7 C) (Oral)   Ht 5\' 5"  (1.651 m)   Wt 154 lb 6.4 oz (70 kg)   BMI 25.69 kg/m    Subjective:    Patient ID: Brady Smith, male    DOB: 11-03-1945, 75 y.o.   MRN: [id number]  HPI: Brady Smith is a 75 y.o. male  Pt is here for a follow up.   COPD There is no cough, shortness of breath or wheezing. Pertinent negatives include no appetite change, chest pain, ear pain, fever, headaches or myalgias. His past medical history is significant for COPD.    Chief Complaint  Patient presents with  . COPD    Relevant past medical, surgical, family and social history reviewed and updated as indicated. Interim medical history since our last visit reviewed. Allergies and medications reviewed and updated.  Review of Systems  Constitutional: Negative for activity change, appetite change, chills, fatigue and fever.  HENT: Negative for congestion, ear discharge, ear pain and facial swelling.   Eyes: Negative for pain, discharge and itching.  Respiratory: Negative for cough, chest tightness, shortness of breath and wheezing.   Cardiovascular: Negative for chest pain, palpitations and leg swelling.  Gastrointestinal: Negative for abdominal distention, abdominal pain, blood in stool, constipation, diarrhea, nausea and vomiting.  Endocrine: Negative for cold intolerance, heat intolerance, polydipsia, polyphagia and polyuria.  Genitourinary: Negative for difficulty urinating, dysuria, flank pain, frequency, hematuria and urgency.  Musculoskeletal: Negative for arthralgias, gait problem, joint swelling and myalgias.  Skin: Negative for color change, rash and wound.  Neurological: Negative for dizziness, tremors, speech difficulty, weakness, light-headedness, numbness and headaches.  Hematological: Does not bruise/bleed easily.  Psychiatric/Behavioral: Negative for agitation, confusion, decreased concentration, sleep disturbance and suicidal ideas.     Per HPI unless specifically indicated above     Objective:    BP (!) 154/81   Pulse 89   Temp 98 F (36.7 C) (Oral)   Ht 5\' 5"  (1.651 m)   Wt 154 lb 6.4 oz (70 kg)   BMI 25.69 kg/m   Wt Readings from Last 3 Encounters:  08/10/20 154 lb 6.4 oz (70 kg)  07/11/20 153 lb (69.4 kg)  07/11/20 153 lb (69.4 kg)    Physical Exam Vitals and nursing note reviewed.  Constitutional:      General: He is not in acute distress.    Appearance: Normal appearance. He is not ill-appearing or diaphoretic.  HENT:     Head: Normocephalic and atraumatic.     Right Ear: Tympanic membrane and external ear normal. There is no impacted cerumen.     Left Ear: External ear normal.     Nose: No congestion or rhinorrhea.     Mouth/Throat:     Pharynx: No oropharyngeal exudate or posterior oropharyngeal erythema.  Eyes:     Conjunctiva/sclera: Conjunctivae normal.     Pupils: Pupils are equal, round, and reactive to light.  Cardiovascular:     Rate and Rhythm: Normal rate and regular rhythm.     Heart sounds: No murmur heard. No friction rub. No gallop.   Pulmonary:     Effort: No respiratory distress.     Breath sounds: No stridor. No wheezing or rhonchi.  Chest:     Chest wall: No tenderness.  Abdominal:     General: Abdomen is flat. Bowel sounds are normal. There is no distension.     Palpations: Abdomen is soft. There is no  mass.     Tenderness: There is no abdominal tenderness. There is no guarding.  Musculoskeletal:        General: No swelling or deformity.     Cervical back: Normal range of motion and neck supple. No rigidity or tenderness.     Right lower leg: No edema.     Left lower leg: No edema.  Skin:    General: Skin is warm and dry.     Coloration: Skin is not jaundiced.     Findings: No erythema.  Neurological:     Mental Status: He is alert and oriented to person, place, and time. Mental status is at baseline.  Psychiatric:        Mood and Affect: Mood normal.         Behavior: Behavior normal.        Thought Content: Thought content normal.        Judgment: Judgment normal.     Results for orders placed or performed in visit on 07/28/20  B12  Result Value Ref Range   Vitamin B-12 294 232 - 1,245 pg/mL        Current Outpatient Medications:  .  albuterol (VENTOLIN HFA) 108 (90 Base) MCG/ACT inhaler, Inhale 2 puffs into the lungs every 6 (six) hours as needed for wheezing or shortness of breath., Disp: 8.5 g, Rfl: 5 .  apixaban (ELIQUIS) 5 MG TABS tablet, Take 1 tablet (5 mg total) by mouth 2 (two) times daily., Disp: 180 tablet, Rfl: 4 .  apixaban (ELIQUIS) 5 MG TABS tablet, Take 1 tablet (5 mg total) by mouth 2 (two) times daily., Disp: 60 tablet, Rfl: 3 .  citalopram (CELEXA) 10 MG tablet, Take 1 tablet (10 mg total) by mouth daily., Disp: 30 tablet, Rfl: 0 .  gabapentin (NEURONTIN) 600 MG tablet, Take 1 tablet (600 mg total) by mouth at bedtime., Disp: 60 tablet, Rfl: 1    Assessment & Plan:  1.Anxiety : is on celexa for such  Continue such  Social worker sees pt. Pt   2. Peripheral neuropathy  Is on neurotin for such   3. Atial fibrillation:  Is  On eliquis  Will refer to cardiology    Problem List Items Addressed This Visit   None     Follow up plan: No follow-ups on file.

## 2020-08-10 NOTE — Patient Instructions (Signed)
Visit Information  Goals Addressed            This Visit's Progress   . Obtain safe and Affordable Housing       Timeframe:  Long-Range Goal Priority:  High Start Date:  08/03/20                           Expected End Date:  11/22/20                     Follow Up Date within 14 days from 08/10/20   Patient Goals/Self-Care Activities: Over the next 120 days . Follow up on housing resources provided . Attend all scheduled provider appointments . Contact Law Enforcement as discussed with any safety concerns       Patient verbalizes understanding of instructions provided today and agrees to view in [place].   Telephone follow up appointment with care management team member scheduled PRF:FMBWGY 14 days from 08/10/20  Christa See, MSW, [email or web address] Phone [phone] 4:45 PM

## 2020-08-10 NOTE — Chronic Care Management (AMB) (Signed)
Chronic Care Management    Clinical Social Work Note  08/10/2020 Name: Brady Smith MRN: [id number] DOB: Apr 14, 1945  Brady Smith is a 75 y.o. year old male who is a primary care patient of Vigg, Avanti, MD. The CCM team was consulted to assist the patient with chronic disease management and/or care coordination needs related to: [employer] .   Engaged with patient face to face for follow up visit in response to provider referral for social work chronic care management and care coordination services.   Consent to Services:  The patient was given information about Chronic Care Management services, agreed to services, and gave verbal consent prior to initiation of services.  Please see initial visit note for detailed documentation.   Patient agreed to services and consent obtained.   Assessment: Patient is currently experiencing symptoms of  anxiety which seems to be exacerbated by housing instability. He is not willing to reside with family. CCM LCSW has provided patient with independent housing options, in addition, to local emergency shelters. Patient has agreed to follow up with information provided and a new referral to Legal Aid has been submitted. See Care Plan below for interventions and patient self-care actives. Recent life changes /stressors: Unstable housing  Recommendation: Patient may benefit from, and is in agreement to compliance with care plan to assist with management of symptoms.  Follow up Plan: Patient would like continued follow-up.  CCM LCSW will follow up with patient within 14 days. Patient will call office if needed prior to next encounter.    SDOH (Social Determinants of Health) assessments and interventions performed:    Advanced Directives Status: Not addressed in this encounter.  CCM Care Plan  Allergies  Allergen Reactions  . Influenza Vaccines     High blood pressure- had to be admitted  . Lipitor [Atorvastatin Calcium]     Muscle pain  .  Covid-19 (Adenovirus) Vaccine Rash    Outpatient Encounter Medications as of 08/10/2020  Medication Sig  . albuterol (VENTOLIN HFA) 108 (90 Base) MCG/ACT inhaler Inhale 2 puffs into the lungs every 6 (six) hours as needed for wheezing or shortness of breath.  Marland Kitchen apixaban (ELIQUIS) 5 MG TABS tablet Take 1 tablet (5 mg total) by mouth 2 (two) times daily.  . citalopram (CELEXA) 10 MG tablet Take 1 tablet (10 mg total) by mouth daily.  Marland Kitchen gabapentin (NEURONTIN) 600 MG tablet Take 1 tablet (600 mg total) by mouth at bedtime.   No facility-administered encounter medications on file as of 08/10/2020.    Patient Active Problem List   Diagnosis Date Noted  . Dyspepsia 01/18/2020  . Primary insomnia 01/18/2020  . COPD with acute exacerbation ([place]) 12/23/2019  . Pulmonary embolism ([place]) 12/23/2019  . Tobacco abuse 12/04/2019  . Dermatitis 11/24/2019  . Senile ecchymosis 08/02/2019  . B12 nutritional deficiency 06/07/2019  . Memory changes 04/24/2018  . Excoriation of scalp 04/24/2018  . Paroxysmal atrial fibrillation ([place]) 06/13/2017  . Elevated blood sugar 02/03/2017  . S/P amputation of lesser toe ([place]) 07/25/2015  . Peripheral vascular disease ([place]) 07/14/2015  . Toxic metabolic encephalopathy 40/10/6759  . AA (alcohol abuse) 05/23/2015  . SBO (small bowel obstruction) ([place]) 05/18/2015  . Urinary retention 05/18/2015  . Essential hypertension 05/18/2015  . Choroidal nevus, right eye 02/08/2015  . Chronic obstructive pulmonary disease ([place]) 02/08/2015  . Carpal tunnel syndrome 01/13/2015  . H/O gastrointestinal disease 01/13/2015  . History of primary malignant neoplasm of urinary bladder 01/13/2015  . Compulsive tobacco  user syndrome 01/13/2015  . Nicotine addiction 01/13/2015  . HLD (hyperlipidemia) 01/13/2015  . Neurosis, phobic 01/13/2015  . Lung nodule, solitary 06/11/2011    Conditions to be addressed/monitored: Anxiety; Housing barriers  Care Plan : General Social Work  (Adult)  Updates made by [name], LCSW since 08/10/2020 12:00 AM    Problem: Barriers to Treatment     Goal: Barriers to Treatment Identified and Managed   Start Date: 08/03/2020  This Visit's Progress: On track  Recent Progress: On track  Priority: High  Note:   Current barriers:   .  Housing barriers Clinical Goals: Patient will work with 120 days to address needs related to obtaining safe and affordable housing Clinical Interventions:  . Assessment of needs and barriers to care conducted . Patient reports that he has family and a friend that he can stay with; however, he is uncomfortable with asking and feels that it will not be appropriate. Patient would like to focus on obtaining independent housing. He has a hx of residing in his car and is willing to do this again, if needed . CCM LCSW provided patient with independent housing options within [facility]. Patient agreed to call landlords to inquire about vacancies and/or eligibility requirements  . CCM LCSW will make another referral to Legal Aid and contact [place] to assist patient with application.  . Collaboration with Charlynne Cousins, MD regarding development and update of comprehensive plan of care was discussed in person at visit. CCM LCSW has concerns for cognitive delays; however, patient refuses to be assessed by psychiatry.    Bertram Savin care team collaboration (see longitudinal plan of care) . Review various resources, discussed options and provided patient information about Housing resources  . Other interventions provided:Solution-Focused Strategies, Active listening / Reflection utilized , and Emotional Supportive Provided   Patient Goals/Self-Care Activities: Over the next 120 days . Follow up on housing resources provided . Attend all scheduled provider appointments . Contact Event organiser as discussed with any safety concerns         Christa See, MSW, [email or web address] Phone [phone] 4:43 PM

## 2020-08-11 NOTE — Progress Notes (Signed)
Please let pt know this was normal. Needs to take otc multivitamins. thnx

## 2020-08-14 ENCOUNTER — Telehealth: Payer: Self-pay | Admitting: Internal Medicine

## 2020-08-14 NOTE — Telephone Encounter (Signed)
Thedora Hinders (on [employer]) is calling for [employer]. Please advise Cb- [phone]

## 2020-08-15 NOTE — Telephone Encounter (Signed)
Pt is calling and would like jasmine lewis to provide him with the name of shelter in [place] and address and phone number

## 2020-08-17 ENCOUNTER — Telehealth: Payer: Self-pay | Admitting: Licensed Clinical Social Worker

## 2020-08-17 ENCOUNTER — Telehealth: Payer: Self-pay

## 2020-08-17 NOTE — Telephone Encounter (Signed)
Thank you. I will call him.

## 2020-08-17 NOTE — Telephone Encounter (Signed)
Pt would like a call from pharmacist  as he has some questions about his medication and would like to speak  To Almyra Free.

## 2020-08-26 DIAGNOSIS — J441 Chronic obstructive pulmonary disease with (acute) exacerbation: Secondary | ICD-10-CM | POA: Diagnosis not present

## 2020-09-11 ENCOUNTER — Ambulatory Visit (INDEPENDENT_AMBULATORY_CARE_PROVIDER_SITE_OTHER): Payer: Medicare HMO | Admitting: Licensed Clinical Social Worker

## 2020-09-11 DIAGNOSIS — I10 Essential (primary) hypertension: Secondary | ICD-10-CM

## 2020-09-11 DIAGNOSIS — J449 Chronic obstructive pulmonary disease, unspecified: Secondary | ICD-10-CM | POA: Diagnosis not present

## 2020-09-11 DIAGNOSIS — Z789 Other specified health status: Secondary | ICD-10-CM

## 2020-09-13 NOTE — Patient Instructions (Signed)
Visit Information   Goals Addressed             This Visit's Progress    Obtain safe and Affordable Housing   On track    Timeframe:  Long-Range Goal Priority:  High Start Date:  08/03/20                           Expected End Date:  11/22/20                     Follow Up Date within 14 days from 09/11/20   Patient Goals/Self-Care Activities: Over the next 120 days Follow up on housing resources provided Attend all scheduled provider appointments          Patient verbalizes understanding of instructions provided today.   CCM LCSW will follow up with patient within 14 days from 09/11/20  Christa See, MSW, [email or web address] Phone [phone] 10:14 AM

## 2020-09-13 NOTE — Chronic Care Management (AMB) (Signed)
Chronic Care Management    Clinical Social Work Note  09/13/2020 Name: Brady Smith MRN: [id number] DOB: 1946/03/05  Brady Smith is a 75 y.o. year old male who is a primary care patient of Vigg, Avanti, MD. The CCM team was consulted to assist the patient with chronic disease management and/or care coordination needs related to: [employer] .   Engaged with patient by telephone for follow up visit in response to provider referral for social work chronic care management and care coordination services.   Consent to Services:  The patient was given information about Chronic Care Management services, agreed to services, and gave verbal consent prior to initiation of services.  Please see initial visit note for detailed documentation.   Patient agreed to services and consent obtained.   Assessment: Patient is engaged in conversation, continues to maintain positive progress with care plan goals. Pt has obtained a new residence (room to rent) and reports no safety concerns. CCM LCSW provided independent income-based senior housing options and offered assistance with completion of applications. See Care Plan below for interventions and patient self-care actives. Recommendation: Patient may benefit from, and is in agreement to work with LCSW to address care coordination needs and will continue to work with the clinical team to address health care and disease management related needs.  Follow up Plan: Patient would like continued follow-up.  CCM LCSW will follow up with patient. Patient will call office if needed prior to next encounter.  SDOH (Social Determinants of Health) assessments and interventions performed:    Advanced Directives Status: Not addressed in this encounter.  CCM Care Plan  Allergies  Allergen Reactions   Influenza Vaccines     High blood pressure- had to be admitted   Lipitor [Atorvastatin Calcium]     Muscle pain   Covid-19 (Adenovirus) Vaccine Rash     Outpatient Encounter Medications as of 09/11/2020  Medication Sig   apixaban (ELIQUIS) 5 MG TABS tablet Take 1 tablet (5 mg total) by mouth 2 (two) times daily.   citalopram (CELEXA) 10 MG tablet Take 1 tablet (10 mg total) by mouth daily.   gabapentin (NEURONTIN) 600 MG tablet Take 1 tablet (600 mg total) by mouth at bedtime.   No facility-administered encounter medications on file as of 09/11/2020.    Patient Active Problem List   Diagnosis Date Noted   Dyspepsia 01/18/2020   Primary insomnia 01/18/2020   COPD with acute exacerbation ([place]) 12/23/2019   Pulmonary embolism ([place]) 12/23/2019   Tobacco abuse 12/04/2019   Dermatitis 11/24/2019   Senile ecchymosis 08/02/2019   B12 nutritional deficiency 06/07/2019   Memory changes 04/24/2018   Excoriation of scalp 04/24/2018   Paroxysmal atrial fibrillation ([place]) 06/13/2017   Elevated blood sugar 02/03/2017   S/P amputation of lesser toe ([place]) 07/25/2015   Peripheral vascular disease ([place]) 76/28/3151   Toxic metabolic encephalopathy 76/16/0737   AA (alcohol abuse) 05/23/2015   SBO (small bowel obstruction) ([place]) 05/18/2015   Urinary retention 05/18/2015   Essential hypertension 05/18/2015   Choroidal nevus, right eye 02/08/2015   Chronic obstructive pulmonary disease ([place]) 02/08/2015   Carpal tunnel syndrome 01/13/2015   H/O gastrointestinal disease 01/13/2015   History of primary malignant neoplasm of urinary bladder 01/13/2015   Compulsive tobacco user syndrome 01/13/2015   Nicotine addiction 01/13/2015   HLD (hyperlipidemia) 01/13/2015   Neurosis, phobic 01/13/2015   Lung nodule, solitary 06/11/2011    Conditions to be addressed/monitored: Homelessness; Housing barriers  Care Plan : General Social  Work (Adult)  Updates made by [name], LCSW since 09/13/2020 12:00 AM     Problem: Barriers to Treatment      Goal: Barriers to Treatment Identified and Managed   Start Date: 08/03/2020  This Visit's Progress:  On track  Recent Progress: On track  Priority: High  Note:   Current barriers:    Housing barriers Clinical Goals: Patient will work with 120 days to address needs related to obtaining safe and affordable housing Clinical Interventions:  Assessment of needs and barriers to care conducted CCM LCSW received an incoming call from patient's friend, Ms. Jerline Pain. She shared concerns about patient's new residence because the homeowner continues to ask to borrow money. Ms. Jerline Pain has discussed her concerns with patient because he is on a fixed income and experiences financial strain, at times. Patient's brother encouraged him to stop paying his rent early and pay on the date due. Ms. Jerline Pain will continue to look for independent housing options for patient  Patient reports that he has moved to [place]. He is currently renting a room and states that things are going well. He reports no safety concerns and states, "rent is reasonable and they're nice" He is able to stay at residence for as long as he needs to.  CCM LCSW informed patient of various senior housing wait-lists that are approximately 2-3 months long. Patient was encouraged to submit housing applications to be added to wait-lists. Patient was informed that CCM LCSW could assist with completion of applications  Patient reports that he would prefer to know how much rent would be prior to completing applications stating, "I don't want to waste time if it's out of my budget". Patient prefers rent to be approx. $450. CCM LCSW agreed to follow up with senior housing management to obtain an approximate rent amount.  CCM LCSW has concerns for cognitive delays; however, patient refuses to be assessed by psychiatry.   Collaboration with Charlynne Cousins, MD regarding development and update of comprehensive plan of care was discussed in person at visit.   Inter-disciplinary care team collaboration (see longitudinal plan of care) Review various resources, discussed  options and provided patient information about Housing resources  Other interventions provided:Solution-Focused Strategies, Active listening / Reflection utilized , and Emotional Supportive Provided   Patient Goals/Self-Care Activities: Over the next 120 days Follow up on housing resources provided Attend all scheduled provider appointments Contact Law Enforcement as discussed with any safety concerns        Christa See, MSW, [email or web address] Phone [phone] 10:13 AM

## 2020-09-25 DIAGNOSIS — J441 Chronic obstructive pulmonary disease with (acute) exacerbation: Secondary | ICD-10-CM | POA: Diagnosis not present

## 2020-10-09 ENCOUNTER — Ambulatory Visit (INDEPENDENT_AMBULATORY_CARE_PROVIDER_SITE_OTHER): Payer: Medicare HMO | Admitting: Licensed Clinical Social Worker

## 2020-10-09 DIAGNOSIS — J449 Chronic obstructive pulmonary disease, unspecified: Secondary | ICD-10-CM

## 2020-10-09 DIAGNOSIS — I10 Essential (primary) hypertension: Secondary | ICD-10-CM | POA: Diagnosis not present

## 2020-10-09 DIAGNOSIS — R413 Other amnesia: Secondary | ICD-10-CM

## 2020-10-09 DIAGNOSIS — Z789 Other specified health status: Secondary | ICD-10-CM

## 2020-10-10 NOTE — Patient Instructions (Signed)
Visit Information   Goals Addressed             This Visit's Progress    Obtain safe and Affordable Housing   On track    Timeframe:  Long-Range Goal Priority:  High Start Date:  08/03/20                           Expected End Date:  11/22/20                     Follow Up Date: 10/13/20   Patient Goals/Self-Care Activities: Over the next 120 days Follow up on housing resources provided Attend all scheduled provider appointments         Patient verbalizes understanding of instructions provided today.   Telephone follow up appointment with care management team member scheduled for:10/13/20  Christa See, MSW, [email or web address] Phone [phone] 9:00 AM

## 2020-10-10 NOTE — Chronic Care Management (AMB) (Signed)
Chronic Care Management    Clinical Social Work Note  10/10/2020 Name: Brady Smith MRN: [id number] DOB: 1946-03-09  Brady Smith is a 75 y.o. year old male who is a primary care patient of Vigg, Avanti, MD. The CCM team was consulted to assist the patient with chronic disease management and/or care coordination needs related to: [employer] , Mental Health Counseling and Resources, and Financial Difficulties related to Housing .   Engaged with patient by telephone for follow up visit in response to provider referral for social work chronic care management and care coordination services.   Consent to Services:  The patient was given information about Chronic Care Management services, agreed to services, and gave verbal consent prior to initiation of services.  Please see initial visit note for detailed documentation.   Patient agreed to services and consent obtained.   Assessment: Patient is engaged in conversation, continues to maintain positive progress with care plan goals. Patient is currently experiencing difficulty with obtaining independent housing. Patient is currently residing in vehicle. CCM LCSW will discuss additional housing options at next scheduled appointment. See Care Plan below for interventions and patient self-care actives. Recent life changes [name]: Housing Recommendation: Patient may benefit from, and is in agreement to work with LCSW to address care coordination needs and will continue to work with the clinical team to address health care and disease management related needs.  Follow up Plan: Patient would like continued follow-up from CCM LCSW .  Follow up scheduled in 10/13/20. Patient will call office if needed prior to next encounter.    SDOH (Social Determinants of Health) assessments and interventions performed:    Advanced Directives Status: Not addressed in this encounter.  CCM Care Plan  Allergies  Allergen Reactions   Influenza Vaccines      High blood pressure- had to be admitted   Lipitor [Atorvastatin Calcium]     Muscle pain   Covid-19 (Adenovirus) Vaccine Rash    Outpatient Encounter Medications as of 10/09/2020  Medication Sig   apixaban (ELIQUIS) 5 MG TABS tablet Take 1 tablet (5 mg total) by mouth 2 (two) times daily.   citalopram (CELEXA) 10 MG tablet Take 1 tablet (10 mg total) by mouth daily.   gabapentin (NEURONTIN) 600 MG tablet Take 1 tablet (600 mg total) by mouth at bedtime.   No facility-administered encounter medications on file as of 10/09/2020.    Patient Active Problem List   Diagnosis Date Noted   Dyspepsia 01/18/2020   Primary insomnia 01/18/2020   COPD with acute exacerbation ([place]) 12/23/2019   Pulmonary embolism ([place]) 12/23/2019   Tobacco abuse 12/04/2019   Dermatitis 11/24/2019   Senile ecchymosis 08/02/2019   B12 nutritional deficiency 06/07/2019   Memory changes 04/24/2018   Excoriation of scalp 04/24/2018   Paroxysmal atrial fibrillation ([place]) 06/13/2017   Elevated blood sugar 02/03/2017   S/P amputation of lesser toe ([place]) 07/25/2015   Peripheral vascular disease ([place]) 75/12/2583   Toxic metabolic encephalopathy 27/78/2423   AA (alcohol abuse) 05/23/2015   SBO (small bowel obstruction) ([place]) 05/18/2015   Urinary retention 05/18/2015   Essential hypertension 05/18/2015   Choroidal nevus, right eye 02/08/2015   Chronic obstructive pulmonary disease ([place]) 02/08/2015   Carpal tunnel syndrome 01/13/2015   H/O gastrointestinal disease 01/13/2015   History of primary malignant neoplasm of urinary bladder 01/13/2015   Compulsive tobacco user syndrome 01/13/2015   Nicotine addiction 01/13/2015   HLD (hyperlipidemia) 01/13/2015   Neurosis, phobic 01/13/2015   Lung nodule,  solitary 06/11/2011    Conditions to be addressed/monitored:  Stress ; Financial constraints related to Housing, Limited social support, Housing barriers, and Mental Health Concerns   Care Plan : General Social Work  (Adult)  Updates made by [name], LCSW since 10/10/2020 12:00 AM     Problem: Barriers to Treatment      Goal: Barriers to Treatment Identified and Managed   Start Date: 08/03/2020  This Visit's Progress: On track  Recent Progress: On track  Priority: High  Note:   Current barriers:    Housing barriers Clinical Goals: Patient will work with 120 days to address needs related to obtaining safe and affordable housing Clinical Interventions:  Assessment of needs and barriers to care conducted Patient reported that he has been residing in his vehicle for a week. States that the family he was staying with collected his rent money and kicked him out of the home. He has concerns that they use drugs due to the behavior he observed while there Patient reports that he plans to stay in his vehicle to assist with saving money. He gets paid at the beginning of the month (3rd and 31st) Support and encouragement was provided. CCM LCSW discussed housing options Patient shared that he needs to speak with CCM LCSW at a later time. CCM LCSW will follow up with patient toward the end of the week CCM LCSW informed patient of various senior housing wait-lists that are approximately 2-3 months long. Patient was encouraged to submit housing applications to be added to wait-lists. Patient was informed that CCM LCSW could assist with completion of applications  Patient reports that he would prefer to know how much rent would be prior to completing applications stating, "I don't want to waste time if it's out of my budget". Patient prefers rent to be approx. $450. CCM LCSW agreed to follow up with senior housing management to obtain an approximate rent amount.  CCM LCSW has concerns for cognitive delays; however, patient refuses to be assessed by psychiatry.   Collaboration with Charlynne Cousins, MD regarding development and update of comprehensive plan of care was discussed in person at visit.   Inter-disciplinary  care team collaboration (see longitudinal plan of care) Review various resources, discussed options and provided patient information about Housing resources  Other interventions provided:Solution-Focused Strategies, Active listening / Reflection utilized , and Emotional Supportive Provided   Patient Goals/Self-Care Activities: Over the next 120 days Follow up on housing resources provided Attend all scheduled provider appointments Contact Law Enforcement as discussed with any safety concerns        Christa See, MSW, [email or web address] Phone [phone] 8:58 AM

## 2020-10-26 DIAGNOSIS — J441 Chronic obstructive pulmonary disease with (acute) exacerbation: Secondary | ICD-10-CM | POA: Diagnosis not present

## 2020-10-27 ENCOUNTER — Ambulatory Visit (INDEPENDENT_AMBULATORY_CARE_PROVIDER_SITE_OTHER): Payer: Medicare HMO

## 2020-10-27 ENCOUNTER — Telehealth: Payer: Self-pay | Admitting: Internal Medicine

## 2020-10-27 VITALS — Ht 66.0 in | Wt 160.0 lb

## 2020-10-27 DIAGNOSIS — Z0000 Encounter for general adult medical examination without abnormal findings: Secondary | ICD-10-CM

## 2020-10-27 NOTE — Telephone Encounter (Signed)
Patient would like someone to contact him in reference to on going itching he has been experiencing.    Would like to see if something can help relieve the itching

## 2020-10-27 NOTE — Telephone Encounter (Signed)
Pt has been scheduled for an appointment on Tuesday 10/31/2020 @ 8:40

## 2020-10-27 NOTE — Progress Notes (Signed)
I connected with Brady Smith today by telephone and verified that I am speaking with the correct person using two identifiers. Location patient: home Location provider: work Persons participating in the virtual visit: Brady Smith, Glenna Durand LPN.   I discussed the limitations, risks, security and privacy concerns of performing an evaluation and management service by telephone and the availability of in person appointments. I also discussed with the patient that there may be a patient responsible charge related to this service. The patient expressed understanding and verbally consented to this telephonic visit.    Interactive audio and video telecommunications were attempted between this provider and patient, however failed, due to patient having technical difficulties OR patient did not have access to video capability.  We continued and completed visit with audio only.     Vital signs may be patient reported or missing.  Subjective:   Brady Smith is a 75 y.o. male who presents for Medicare Annual/Subsequent preventive examination.  Review of Systems     Cardiac Risk Factors include: advanced age (>17mn, >>75women);hypertension;smoking/ tobacco exposure;male gender;sedentary lifestyle     Objective:    Today's Vitals   10/27/20 1516  Weight: 160 lb (72.6 kg)  Height: '5\' 6"'$  (1.676 m)   Body mass index is 25.82 kg/m.  Advanced Directives 10/27/2020 12/28/2019 12/24/2019 12/24/2019 12/18/2019 12/04/2019 11/20/2019  Does Patient Have a Medical Advance Directive? No No No No No No No  Type of Advance Directive - - - - - - -  Does patient want to make changes to medical advance directive? - - - - - - -  Copy of [place]? - - - - - - -  Would patient like information on creating a medical advance directive? - No - Patient declined - No - Patient declined No - Patient declined No - Patient declined -    Current Medications (verified) Outpatient Encounter  Medications as of 10/27/2020  Medication Sig   apixaban (ELIQUIS) 5 MG TABS tablet Take 1 tablet (5 mg total) by mouth 2 (two) times daily.   albuterol (VENTOLIN HFA) 108 (90 Base) MCG/ACT inhaler Inhale 2 puffs into the lungs every 6 (six) hours as needed for wheezing or shortness of breath.   citalopram (CELEXA) 10 MG tablet Take 1 tablet (10 mg total) by mouth daily. (Patient not taking: Reported on 10/27/2020)   gabapentin (NEURONTIN) 600 MG tablet Take 1 tablet (600 mg total) by mouth at bedtime. (Patient not taking: Reported on 10/27/2020)   No facility-administered encounter medications on file as of 10/27/2020.    Allergies (verified) Influenza vaccines, Lipitor [atorvastatin calcium], and Covid-19 (adenovirus) vaccine   History: Past Medical History:  Diagnosis Date   Acute on chronic respiratory failure with hypoxia ([facility] 12/23/2019   Acute respiratory failure with hypoxia ([facility] 12/23/2019   Anxiety    Bladder cancer ([facility]    CAP (community acquired pneumonia) 12/04/2019   COPD (chronic obstructive pulmonary disease) (HCC)    COPD exacerbation ([facility] 06/11/2011   Overview:  Overview:  Followed in Pulmonary clinic/ [facility]/ [place]    - HFA < 25% 06/10/11  Last Assessment & Plan:  Extremely poor hfa technique gives me hope that he really isn't that bad and needs to focus his attention on smoking cessation  I took an extended  opportunity with this patient to outline the consequences of continued cigarette use  in airway disorders based on all the dat   Depression    GERD (  gastroesophageal reflux disease)    History of kidney cancer    Peripheral vascular disease (HCC)    Pneumonia    Past Surgical History:  Procedure Laterality Date   AMPUTATION Right 07/19/2015   Procedure: AMPUTATION DIGIT ( RIGHT FOOT FIFTH TOE );  Surgeon: Algernon Huxley, MD;  Location: [facility];  Service: Vascular;  Laterality: Right;   BOWEL RESECTION  05/20/2015   Procedure: SMALL BOWEL RESECTION;  Surgeon:  Clayburn Pert, MD;  Location: [facility];  Service: General;;   CATARACT EXTRACTION W/PHACO Left 10/06/2018   Procedure: CATARACT EXTRACTION PHACO AND INTRAOCULAR LENS PLACEMENT ([place])  LEFT;  Surgeon: Birder Robson, MD;  Location: [place];  Service: Ophthalmology;  Laterality: Left;  DAY OF TEST. LIVES IN BOARDING HOUSE   CATARACT EXTRACTION W/PHACO Right 10/27/2018   Procedure: CATARACT EXTRACTION PHACO AND INTRAOCULAR LENS PLACEMENT ([place])  RIGHT;  Surgeon: Birder Robson, MD;  Location: [place];  Service: Ophthalmology;  Laterality: Right;  PT HAS TO HAVE COVID TEST MORNING OF LEAVE AT LAST PATIENT   CHOLECYSTECTOMY     COLONOSCOPY  2000   CYSTECTOMY W/ CONTINENT DIVERSION  1996   HERNIA REPAIR     LAPAROTOMY N/A 05/20/2015   Procedure: EXPLORATORY LAPAROTOMY;  Surgeon: Clayburn Pert, MD;  Location: [facility];  Service: General;  Laterality: N/A;   PERIPHERAL VASCULAR CATHETERIZATION N/A 07/03/2015   Procedure: Abdominal Aortogram w/Lower Extremity;  Surgeon: Algernon Huxley, MD;  Location: [place] [facility];  Service: Cardiovascular;  Laterality: N/A;   PERIPHERAL VASCULAR CATHETERIZATION  07/03/2015   Procedure: Lower Extremity Intervention;  Surgeon: Algernon Huxley, MD;  Location: [place] [place] [facility];  Service: Cardiovascular;;   PROSTATECTOMY  1996   PULMONARY THROMBECTOMY N/A 12/24/2019   Procedure: PULMONARY THROMBECTOMY / THROMBOLYSIS;  Surgeon: Katha Cabal, MD;  Location: [place] [facility];  Service: Cardiovascular;  Laterality: N/A;   TONSILLECTOMY     Family History  Problem Relation Age of Onset   Heart disease Mother    Heart disease Father    COPD Sister    Social History   Socioeconomic History   Marital status: Widowed    Spouse name: Not on file   Number of children: 2   Years of education: Not on file   Highest education level: 8th grade  Occupational History   Occupation: disabled  Tobacco Use   Smoking status: Every Day     Packs/day: 0.25    Years: 65.00    Pack years: 16.25    Types: Cigarettes   Smokeless tobacco: Never   Tobacco comments:    reduced # of packs smoked to 5-6 ciggs daily from 3 pks daily   Vaping Use   Vaping Use: Never used  Substance and Sexual Activity   Alcohol use: Yes    Alcohol/week: 12.0 standard drinks    Types: 12 Cans of beer per week   Drug use: No   Sexual activity: Not Currently  Other Topics Concern   Not on file  Social History Narrative   Not on file   Social Determinants of Health   Financial Resource Strain: Low Risk    Difficulty of Paying Living Expenses: Not hard at all  Food Insecurity: No Food Insecurity   Worried About Charity fundraiser in the Last Year: Never true   [place] in the Last Year: Never true  Transportation Needs: No Transportation Needs   Lack of Transportation (Medical): No  Lack of Transportation (Non-Medical): No  Physical Activity: Inactive   Days of Exercise per Week: 0 days   Minutes of Exercise per Session: 0 min  Stress: Stress Concern Present   Feeling of Stress : To some extent  Social Connections: Not on file    Tobacco Counseling Ready to quit: Not Answered Counseling given: Not Answered Tobacco comments: reduced # of packs smoked to 5-6 ciggs daily from 3 pks daily    Clinical Intake:  Pre-visit preparation completed: Yes  Pain : No/denies pain     Nutritional Status: BMI 25 -29 Overweight Nutritional Risks: None Diabetes: No  How often do you need to have someone help you when you read instructions, pamphlets, or other written materials from your doctor or pharmacy?: 1 - Never  Diabetic? no     Information entered by :: NAllen LPN   Activities of Daily Living In your present state of health, do you have any difficulty performing the following activities: 10/27/2020 12/27/2019  Hearing? [place]? N -  Difficulty concentrating or making decisions? N -  Walking or climbing stairs? N -   Dressing or bathing? N -  Doing errands, shopping? N N  Preparing Food and eating ? N -  Using the Toilet? N -  In the past six months, have you accidently leaked urine? Y -  Do you have problems with loss of bowel control? Y -  Managing your Medications? N -  Managing your Finances? N -  Housekeeping or managing your Housekeeping? N -  Some recent data might be hidden    Patient Care Team: Charlynne Cousins, MD as PCP - General (Internal Medicine) Kate Sable, MD as PCP - Cardiology (Cardiology) [place] (Ophthalmology) Birder Robson, MD as Referring Physician (Ophthalmology) [name], LCSW as Social Worker (Licensed Clinical Social Worker)  Indicate any recent [place] you may have received from other than [facility] providers in the past year (date may be approximate).     Assessment:   This is a routine wellness examination for Brady Smith.  Hearing/Vision screen Vision Screening - Comments:: No regular eye exams,  Dietary issues and exercise activities discussed: Current Exercise Habits: The patient does not participate in regular exercise at present   Goals Addressed             This Visit's Progress    Patient Stated       10/27/2020, wants to get rid of itching       Depression Screen PHQ 2/9 Scores 10/27/2020 08/10/2020 07/10/2020 03/07/2020 01/18/2020 01/03/2020 06/07/2019  PHQ - 2 Score 0 0 0 0 '1 4 4  '$ PHQ- 9 Score - - 0 0 '1 12 5    '$ Fall Risk Fall Risk  10/27/2020 08/10/2020 07/10/2020 03/07/2020 01/18/2020  Falls in the past year? 0 0 0 0 0  Number falls in past yr: - 0 - - -  Injury with Fall? - 0 0 - -  Risk for fall due to : Medication side effect - - - -  Risk for fall due to: Comment - - - - -  Follow up Falls evaluation completed;Education provided;Falls prevention discussed Falls evaluation completed - Falls evaluation completed Falls evaluation completed    FALL RISK PREVENTION PERTAINING TO THE HOME:  Any stairs in or around the  home? Yes  If so, are there any without handrails? No  Home free of loose throw rugs in walkways, pet beds, electrical cords, etc? Yes  Adequate lighting in  your home to reduce risk of falls? Yes   ASSISTIVE DEVICES UTILIZED TO PREVENT FALLS:  Life alert? No  Use of a cane, walker or w/c? No  Grab bars in the bathroom? No  Shower chair or bench in shower? No  Elevated toilet seat or a handicapped toilet? No   TIMED UP AND GO:  Was the test performed? No .      Cognitive Function:     6CIT Screen 04/24/2018 07/10/2017  What Year? 0 points 0 points  What month? 0 points 0 points  What time? 0 points 0 points  Count back from 20 0 points 0 points  Months in reverse 4 points 4 points  Repeat phrase 2 points 2 points  Total Score 6 6    Immunizations Immunization History  Administered Date(s) Administered   Influenza, High Dose Seasonal PF 01/23/2017   PFIZER(Purple Top)SARS-COV-2 Vaccination 10/05/2019, 11/08/2019   Pneumococcal Conjugate-13 02/25/2018   Pneumococcal Polysaccharide-23 01/24/2017    TDAP status: Due, Education has been provided regarding the importance of this vaccine. Advised may receive this vaccine at local pharmacy or Health Dept. Aware to provide a copy of the vaccination record if obtained from local pharmacy or Health Dept. Verbalized acceptance and understanding.  Flu Vaccine status: Declined, Education has been provided regarding the importance of this vaccine but patient still declined. Advised may receive this vaccine at local pharmacy or Health Dept. Aware to provide a copy of the vaccination record if obtained from local pharmacy or Health Dept. Verbalized acceptance and understanding.  Pneumococcal vaccine status: Up to date  Covid-19 vaccine status: Completed vaccines  Qualifies for Shingles Vaccine? Yes   Zostavax completed No   Shingrix Completed?: No.    Education has been provided regarding the importance of this vaccine. Patient has  been advised to call insurance company to determine out of pocket expense if they have not yet received this vaccine. Advised may also receive vaccine at local pharmacy or Health Dept. Verbalized acceptance and understanding.  Screening Tests Health Maintenance  Topic Date Due   Zoster Vaccines- Shingrix (1 of 2) Never done   COVID-19 Vaccine (3 - Booster for Pfizer series) 04/09/2020   TETANUS/TDAP  11/23/2020 (Originally 11/08/1964)   Hepatitis C Screening  Completed   PNA vac Low Risk Adult  Completed   HPV VACCINES  Aged Out   INFLUENZA VACCINE  Discontinued   COLONOSCOPY (Pts 45-3yr Insurance coverage will need to be confirmed)  Discontinued    Health Maintenance  Health Maintenance Due  Topic Date Due   Zoster Vaccines- Shingrix (1 of 2) Never done   COVID-19 Vaccine (3 - Booster for Pfizer series) 04/09/2020    Colorectal cancer screening: No longer required.   Lung Cancer Screening: (Low Dose CT Chest recommended if Age 75-80years, 30 pack-year currently smoking OR have quit w/in 15years.) does not qualify.   Lung Cancer Screening Referral: no  Additional Screening:  Hepatitis C Screening: does qualify; Completed 03/06/2017  Vision Screening: Recommended annual ophthalmology exams for early detection of glaucoma and other disorders of the eye. Is the patient up to date with their annual eye exam?  No  Who is the provider or what is the name of the office in which the patient attends annual eye exams? none If pt is not established with a provider, would they like to be referred to a provider to establish care? No .   Dental Screening: Recommended annual dental exams for proper oral hygiene  Community Resource Referral / Chronic Care Management: CRR required this visit?  No   CCM required this visit?  No      Plan:     I have personally reviewed and noted the following in the patient's chart:   Medical and social history Use of alcohol, tobacco or illicit  drugs  Current medications and supplements including opioid prescriptions. Patient is not currently taking opioid prescriptions. Functional ability and status Nutritional status Physical activity Advanced directives List of other physicians Hospitalizations, surgeries, and ER visits in previous 12 months Vitals Screenings to include cognitive, depression, and falls Referrals and appointments  In addition, I have reviewed and discussed with patient certain preventive protocols, quality metrics, and best practice recommendations. A written personalized care plan for preventive services as well as general preventive health recommendations were provided to patient.     Kellie Simmering, LPN   [id number]   Nurse Notes: 6 CIT not administered. Patient declined.

## 2020-10-27 NOTE — Patient Instructions (Signed)
Brady Smith , Thank you for taking time to come for your Medicare Wellness Visit. I appreciate your ongoing commitment to your health goals. Please review the following plan we discussed and let me know if I can assist you in the future.   Screening recommendations/referrals: Colonoscopy: decline Recommended yearly ophthalmology/optometry visit for glaucoma screening and checkup Recommended yearly dental visit for hygiene and checkup  Vaccinations: Influenza vaccine: decline Pneumococcal vaccine: completed 02/25/2018 Tdap vaccine: decline Shingles vaccine: discussed   Covid-19:  10/29/2019, 10/05/2019  Advanced directives: Advance directive discussed with you today.   Conditions/risks identified: smoking  Next appointment: Follow up in one year for your annual wellness visit.   Preventive Care 19 Years and Older, Male Preventive care refers to lifestyle choices and visits with your health care provider that can promote health and wellness. What does preventive care include? A yearly physical exam. This is also called an annual well check. Dental exams once or twice a year. Routine eye exams. Ask your health care provider how often you should have your eyes checked. Personal lifestyle choices, including: Daily care of your teeth and gums. Regular physical activity. Eating a healthy diet. Avoiding tobacco and drug use. Limiting alcohol use. Practicing safe sex. Taking low doses of aspirin every day. Taking vitamin and mineral supplements as recommended by your health care provider. What happens during an annual well check? The services and screenings done by your health care provider during your annual well check will depend on your age, overall health, lifestyle risk factors, and family history of disease. Counseling  Your health care provider may ask you questions about your: Alcohol use. Tobacco use. Drug use. Emotional well-being. Home and relationship well-being. Sexual  activity. Eating habits. History of falls. Memory and ability to understand (cognition). Work and work Statistician. Screening  You may have the following tests or measurements: Height, weight, and BMI. Blood pressure. Lipid and cholesterol levels. These may be checked every 5 years, or more frequently if you are over 31 years old. Skin check. Lung cancer screening. You may have this screening every year starting at age 22 if you have a 30-pack-year history of smoking and currently smoke or have quit within the past 15 years. Fecal occult blood test (FOBT) of the stool. You may have this test every year starting at age 41. Flexible sigmoidoscopy or colonoscopy. You may have a sigmoidoscopy every 5 years or a colonoscopy every 10 years starting at age 60. Prostate cancer screening. Recommendations will vary depending on your family history and other risks. Hepatitis C blood test. Hepatitis B blood test. Sexually transmitted disease (STD) testing. Diabetes screening. This is done by checking your blood sugar (glucose) after you have not eaten for a while (fasting). You may have this done every 1-3 years. Abdominal aortic aneurysm (AAA) screening. You may need this if you are a current or former smoker. Osteoporosis. You may be screened starting at age 57 if you are at high risk. Talk with your health care provider about your test results, treatment options, and if necessary, the need for more tests. Vaccines  Your health care provider may recommend certain vaccines, such as: Influenza vaccine. This is recommended every year. Tetanus, diphtheria, and acellular pertussis (Tdap, Td) vaccine. You may need a Td booster every 10 years. Zoster vaccine. You may need this after age 63. Pneumococcal 13-valent conjugate (PCV13) vaccine. One dose is recommended after age 28. Pneumococcal polysaccharide (PPSV23) vaccine. One dose is recommended after age 18. Talk to your  health care provider about which  screenings and vaccines you need and how often you need them. This information is not intended to replace advice given to you by your health care provider. Make sure you discuss any questions you have with your health care provider. Document Released: 04/07/2015 Document Revised: 11/29/2015 Document Reviewed: 01/10/2015 Elsevier Interactive Patient Education  2017 [place] Prevention in the Home Falls can cause injuries. They can happen to people of all ages. There are many things you can do to make your home safe and to help prevent falls. What can I do on the outside of my home? Regularly fix the edges of walkways and driveways and fix any cracks. Remove anything that might make you trip as you walk through a door, such as a raised step or threshold. Trim any bushes or trees on the path to your home. Use bright outdoor lighting. Clear any walking paths of anything that might make someone trip, such as rocks or tools. Regularly check to see if handrails are loose or broken. Make sure that both sides of any steps have handrails. Any raised decks and porches should have guardrails on the edges. Have any leaves, snow, or ice cleared regularly. Use sand or salt on walking paths during winter. Clean up any spills in your garage right away. This includes oil or grease spills. What can I do in the bathroom? Use night lights. Install grab bars by the toilet and in the tub and shower. Do not use towel bars as grab bars. Use non-skid mats or decals in the tub or shower. If you need to sit down in the shower, use a plastic, non-slip stool. Keep the floor dry. Clean up any water that spills on the floor as soon as it happens. Remove soap buildup in the tub or shower regularly. Attach bath mats securely with double-sided non-slip rug tape. Do not have throw rugs and other things on the floor that can make you trip. What can I do in the bedroom? Use night lights. Make sure that you have a  light by your bed that is easy to reach. Do not use any sheets or blankets that are too big for your bed. They should not hang down onto the floor. Have a firm chair that has side arms. You can use this for support while you get dressed. Do not have throw rugs and other things on the floor that can make you trip. What can I do in the kitchen? Clean up any spills right away. Avoid walking on wet floors. Keep items that you use a lot in easy-to-reach places. If you need to reach something above you, use a strong step stool that has a grab bar. Keep electrical cords out of the way. Do not use floor polish or wax that makes floors slippery. If you must use wax, use non-skid floor wax. Do not have throw rugs and other things on the floor that can make you trip. What can I do with my stairs? Do not leave any items on the stairs. Make sure that there are handrails on both sides of the stairs and use them. Fix handrails that are broken or loose. Make sure that handrails are as long as the stairways. Check any carpeting to make sure that it is firmly attached to the stairs. Fix any carpet that is loose or worn. Avoid having throw rugs at the top or bottom of the stairs. If you do have throw rugs, attach them  to the floor with carpet tape. Make sure that you have a light switch at the top of the stairs and the bottom of the stairs. If you do not have them, ask someone to add them for you. What else can I do to help prevent falls? Wear shoes that: Do not have high heels. Have rubber bottoms. Are comfortable and fit you well. Are closed at the toe. Do not wear sandals. If you use a stepladder: Make sure that it is fully opened. Do not climb a closed stepladder. Make sure that both sides of the stepladder are locked into place. Ask someone to hold it for you, if possible. Clearly mark and make sure that you can see: Any grab bars or handrails. First and last steps. Where the edge of each step  is. Use tools that help you move around (mobility aids) if they are needed. These include: Canes. Walkers. Scooters. Crutches. Turn on the lights when you go into a dark area. Replace any light bulbs as soon as they burn out. Set up your furniture so you have a clear path. Avoid moving your furniture around. If any of your floors are uneven, fix them. If there are any pets around you, be aware of where they are. Review your medicines with your doctor. Some medicines can make you feel dizzy. This can increase your chance of falling. Ask your doctor what other things that you can do to help prevent falls. This information is not intended to replace advice given to you by your health care provider. Make sure you discuss any questions you have with your health care provider. Document Released: 01/05/2009 Document Revised: 08/17/2015 Document Reviewed: 04/15/2014 Elsevier Interactive Patient Education  2017 [employer].

## 2020-10-31 ENCOUNTER — Other Ambulatory Visit: Payer: Self-pay

## 2020-10-31 ENCOUNTER — Ambulatory Visit (INDEPENDENT_AMBULATORY_CARE_PROVIDER_SITE_OTHER): Payer: Medicare HMO | Admitting: Family Medicine

## 2020-10-31 ENCOUNTER — Ambulatory Visit: Payer: Medicare HMO | Admitting: Internal Medicine

## 2020-10-31 ENCOUNTER — Encounter: Payer: Self-pay | Admitting: Family Medicine

## 2020-10-31 VITALS — BP 121/78 | HR 74 | Temp 97.3°F | Wt 148.6 lb

## 2020-10-31 DIAGNOSIS — L299 Pruritus, unspecified: Secondary | ICD-10-CM | POA: Diagnosis not present

## 2020-10-31 DIAGNOSIS — K529 Noninfective gastroenteritis and colitis, unspecified: Secondary | ICD-10-CM | POA: Diagnosis not present

## 2020-10-31 DIAGNOSIS — G6289 Other specified polyneuropathies: Secondary | ICD-10-CM

## 2020-10-31 DIAGNOSIS — G629 Polyneuropathy, unspecified: Secondary | ICD-10-CM | POA: Insufficient documentation

## 2020-10-31 MED ORDER — GABAPENTIN 100 MG PO CAPS: 100.0000 mg | ORAL_CAPSULE | Freq: Three times a day (TID) | ORAL | 3 refills | Status: DC

## 2020-10-31 NOTE — Progress Notes (Signed)
BP 121/78   Pulse 74   Temp (!) 97.3 F (36.3 C) (Oral)   Wt 148 lb 9.6 oz (67.4 kg)   SpO2 97%   BMI 23.98 kg/m    Subjective:    Patient ID: Brady Smith, male    DOB: 1945-11-25, 75 y.o.   MRN: [id number]  HPI: Brady Smith is a 75 y.o. male  Chief Complaint  Patient presents with   Pruritis    Pt states he has had horrible itching all over his neck, head, and face for the last few years   Diarrhea    Pt states he has been having abdominal pain and diarrhea off and on for the last 2 years   Has been having itching for about 3-4+ years. Has generalized itching every  ABDOMINAL ISSUES Duration: 10 years Nature: aching Location: periumbilcal  Severity: mild  Radiation: no Episode duration: couple of minutes Frequency: occasionally Aggravating factors: eating at [employer] Treatments attempted: none Constipation: no Diarrhea: yes Episodes of diarrhea/day: up to 7 times a day Mucous in the stool: yes Heartburn: yes Bloating:no Flatulence: no Nausea: yes- rarely Vomiting: no Episodes of vomit/day: Melena or hematochezia: no Rash: no Jaundice: no Fever: no Weight loss: yes   Relevant past medical, surgical, family and social history reviewed and updated as indicated. Interim medical history since our last visit reviewed. Allergies and medications reviewed and updated.  Review of Systems  Constitutional: Negative.   HENT: Negative.    Respiratory: Negative.    Cardiovascular: Negative.   Gastrointestinal:  Positive for abdominal pain, diarrhea and nausea. Negative for abdominal distention, anal bleeding, blood in stool, constipation, rectal pain and vomiting.  Musculoskeletal: Negative.   Skin: Negative.   Neurological:  Positive for numbness. Negative for dizziness, tremors, seizures, syncope, facial asymmetry, speech difficulty, weakness, light-headedness and headaches.  Psychiatric/Behavioral: Negative.     Per HPI unless specifically indicated  above     Objective:    BP 121/78   Pulse 74   Temp (!) 97.3 F (36.3 C) (Oral)   Wt 148 lb 9.6 oz (67.4 kg)   SpO2 97%   BMI 23.98 kg/m   Wt Readings from Last 3 Encounters:  10/31/20 148 lb 9.6 oz (67.4 kg)  10/27/20 160 lb (72.6 kg)  08/10/20 154 lb 6.4 oz (70 kg)    Physical Exam Vitals and nursing note reviewed.  Constitutional:      General: He is not in acute distress.    Appearance: Normal appearance. He is not ill-appearing, toxic-appearing or diaphoretic.  HENT:     Head: Normocephalic and atraumatic.     Right Ear: External ear normal.     Left Ear: External ear normal.     Nose: Nose normal.     Mouth/Throat:     Mouth: Mucous membranes are moist.     Pharynx: Oropharynx is clear.  Eyes:     General: No scleral icterus.       Right eye: No discharge.        Left eye: No discharge.     Extraocular Movements: Extraocular movements intact.     Conjunctiva/sclera: Conjunctivae normal.     Pupils: Pupils are equal, round, and reactive to light.  Cardiovascular:     Rate and Rhythm: Normal rate and regular rhythm.     Pulses: Normal pulses.     Heart sounds: Normal heart sounds. No murmur heard.   No friction rub. No gallop.  Pulmonary:  Effort: Pulmonary effort is normal. No respiratory distress.     Breath sounds: Normal breath sounds. No stridor. No wheezing, rhonchi or rales.  Chest:     Chest wall: No tenderness.  Musculoskeletal:        General: Normal range of motion.     Cervical back: Normal range of motion and neck supple.  Skin:    General: Skin is warm and dry.     Capillary Refill: Capillary refill takes less than 2 seconds.     Coloration: Skin is not jaundiced or pale.     Findings: No bruising, erythema, lesion or rash.  Neurological:     General: No focal deficit present.     Mental Status: He is alert and oriented to person, place, and time. Mental status is at baseline.  Psychiatric:        Mood and Affect: Mood normal.         Behavior: Behavior normal.        Thought Content: Thought content normal.        Judgment: Judgment normal.    Results for orders placed or performed in visit on 07/28/20  B12  Result Value Ref Range   Vitamin B-12 294 232 - 1,245 pg/mL      Assessment & Plan:   Problem List Items Addressed This Visit       Digestive   Chronic diarrhea    10+ years. Will check labs today and get him into see GI. Referral generated today. Last weight was incorrect and self reported. Patient has lost 6lbs. But has not lost 12lbs. Call with any concerns.        Relevant Orders   Ambulatory referral to Gastroenterology     Nervous and Auditory   Peripheral neuropathy    Had stopped his gabapentin because he didn't know what it was for. Itching has increased. Will restart gabapentin. Recheck 1 month with PCP.        Relevant Medications   gabapentin (NEURONTIN) 100 MG capsule   Other Visit Diagnoses     Itching    -  Primary   No rash. Liver functions normal in April. Will recheck. Likely neuropathic- will restart his gabapentin. Follow up with PCP in 1 month.    Relevant Orders   Comprehensive metabolic panel   CBC with Differential/Platelet   TSH        Follow up plan: Return in about 4 weeks (around 11/28/2020) for with PCP.

## 2020-10-31 NOTE — Assessment & Plan Note (Signed)
Had stopped his gabapentin because he didn't know what it was for. Itching has increased. Will restart gabapentin. Recheck 1 month with PCP.

## 2020-10-31 NOTE — Assessment & Plan Note (Addendum)
10+ years. Will check labs today and get him into see GI. Referral generated today. Last weight was incorrect and self reported. Patient has lost 6lbs. But has not lost 12lbs. Call with any concerns.

## 2020-11-01 ENCOUNTER — Encounter: Payer: Self-pay | Admitting: Family Medicine

## 2020-11-01 LAB — COMPREHENSIVE METABOLIC PANEL
ALT: 11 IU/L (ref 0–44)
AST: 12 IU/L (ref 0–40)
Albumin/Globulin Ratio: 1.7 (ref 1.2–2.2)
Albumin: 4 g/dL (ref 3.7–4.7)
Alkaline Phosphatase: 69 IU/L (ref 44–121)
BUN/Creatinine Ratio: 12 (ref 10–24)
BUN: 12 mg/dL (ref 8–27)
Bilirubin Total: 0.5 mg/dL (ref 0.0–1.2)
CO2: 19 mmol/L — ABNORMAL LOW (ref 20–29)
Calcium: 8.8 mg/dL (ref 8.6–10.2)
Chloride: 106 mmol/L (ref 96–106)
Creatinine, Ser: 1.04 mg/dL (ref 0.76–1.27)
Globulin, Total: 2.3 g/dL (ref 1.5–4.5)
Glucose: 85 mg/dL (ref 65–99)
Potassium: 4.2 mmol/L (ref 3.5–5.2)
Sodium: 142 mmol/L (ref 134–144)
Total Protein: 6.3 g/dL (ref 6.0–8.5)
eGFR: 75 mL/min/{1.73_m2} (ref >59)

## 2020-11-01 LAB — CBC WITH DIFFERENTIAL/PLATELET
Basophils Absolute: 0.1 10*3/uL (ref 0.0–0.2)
Basos: 1 %
EOS (ABSOLUTE): 0.1 10*3/uL (ref 0.0–0.4)
Eos: 1 %
Hematocrit: 50.7 % (ref 37.5–51.0)
Hemoglobin: 16.7 g/dL (ref 13.0–17.7)
Immature Grans (Abs): 0.1 10*3/uL (ref 0.0–0.1)
Immature Granulocytes: 1 %
Lymphocytes Absolute: 1.4 10*3/uL (ref 0.7–3.1)
Lymphs: 17 %
MCH: 30.8 pg (ref 26.6–33.0)
MCHC: 32.9 g/dL (ref 31.5–35.7)
MCV: 93 fL (ref 79–97)
Monocytes Absolute: 0.7 10*3/uL (ref 0.1–0.9)
Monocytes: 8 %
Neutrophils Absolute: 5.9 10*3/uL (ref 1.4–7.0)
Neutrophils: 72 %
Platelets: 183 10*3/uL (ref 150–450)
RBC: 5.43 x10E6/uL (ref 4.14–5.80)
RDW: 14.9 % (ref 11.6–15.4)
WBC: 8.1 10*3/uL (ref 3.4–10.8)

## 2020-11-01 LAB — TSH: TSH: 1.28 u[IU]/mL (ref 0.450–4.500)

## 2020-11-06 ENCOUNTER — Other Ambulatory Visit: Payer: Medicare HMO

## 2020-11-13 ENCOUNTER — Ambulatory Visit: Payer: Medicare HMO | Admitting: Internal Medicine

## 2020-11-26 DIAGNOSIS — J441 Chronic obstructive pulmonary disease with (acute) exacerbation: Secondary | ICD-10-CM | POA: Diagnosis not present

## 2020-12-01 ENCOUNTER — Other Ambulatory Visit: Payer: Self-pay | Admitting: Internal Medicine

## 2020-12-01 MED ORDER — ALBUTEROL SULFATE HFA 108 (90 BASE) MCG/ACT IN AERS: 2.0000 | INHALATION_SPRAY | Freq: Four times a day (QID) | RESPIRATORY_TRACT | 5 refills | Status: DC | PRN

## 2020-12-01 NOTE — Telephone Encounter (Signed)
Requested medications are due for refill today.  unsure  Requested medications are on the active medications list.  yes  Last refill. 08/10/2020-09/09/2020  Future visit scheduled.   yes  Notes to clinic.  Prescription is expired.

## 2020-12-01 NOTE — Telephone Encounter (Signed)
Medication: albuterol (VENTOLIN HFA) 108 (90 Base) MCG/ACT inhaler [place]:[id number]  Has the patient contacted their pharmacy? YES  (Agent: If no, request that the patient contact the pharmacy for the refill.) (Agent: If yes, when and what did the pharmacy advise?)  Preferred Pharmacy (with phone number or street name): [place], [place] [address] Phone: [phone] Fax: [phone] Hours: Not open 24 hours  Agent: Please be advised that RX refills may take up to 3 business days. We ask that you follow-up with your pharmacy.

## 2020-12-04 ENCOUNTER — Encounter: Payer: Self-pay | Admitting: Internal Medicine

## 2020-12-04 ENCOUNTER — Other Ambulatory Visit: Payer: Self-pay

## 2020-12-04 ENCOUNTER — Ambulatory Visit (INDEPENDENT_AMBULATORY_CARE_PROVIDER_SITE_OTHER): Payer: Medicare HMO | Admitting: Internal Medicine

## 2020-12-04 VITALS — BP 150/92 | HR 76 | Temp 97.7°F | Ht 66.14 in | Wt 155.4 lb

## 2020-12-04 DIAGNOSIS — J449 Chronic obstructive pulmonary disease, unspecified: Secondary | ICD-10-CM

## 2020-12-04 DIAGNOSIS — R413 Other amnesia: Secondary | ICD-10-CM

## 2020-12-04 MED ORDER — APIXABAN 5 MG PO TABS: 5.0000 mg | ORAL_TABLET | Freq: Two times a day (BID) | ORAL | 3 refills | Status: DC

## 2020-12-04 MED ORDER — TRELEGY ELLIPTA 100-62.5-25 MCG/INH IN AEPB: 1.0000 | INHALATION_SPRAY | Freq: Every day | RESPIRATORY_TRACT | 11 refills | Status: DC

## 2020-12-04 MED ORDER — GABAPENTIN 100 MG PO CAPS: 100.0000 mg | ORAL_CAPSULE | Freq: Every day | ORAL | 3 refills | Status: DC

## 2020-12-04 NOTE — Progress Notes (Signed)
BP (!) 150/92   Pulse 76   Temp 97.7 F (36.5 C) (Oral)   Ht 5' 6.14" (1.68 m)   Wt 155 lb 6.4 oz (70.5 kg)   SpO2 98%   BMI 24.97 kg/m    Subjective:    Patient ID: Brady Smith, male    DOB: 06-30-1945, 75 y.o.   MRN: [id number]  Chief Complaint  Patient presents with   Medication Problem    Patient states that his gabapentin 136m is making him lose his memory.     HPI: PCODI KERTZis a 75y.o. male  Patient presents with: Medication Problem: Patient states that his gabapentin 1029mis making him lose his memory.   Pt says he had met freidns who suggested his memory issues were sec to such.   Says he has had lapses of memory   Shortness of Breath This is a chronic (uses albuterol prn hasnt been usng this regularly per pt. declines seeing any specialists.) problem. The current episode started more than 1 year ago. Associated symptoms include wheezing. Pertinent negatives include no abdominal pain, chest pain, claudication, coryza, ear pain, fever, headaches, hemoptysis, leg pain, leg swelling, orthopnea, PND, rhinorrhea, sore throat, sputum production, swollen glands, syncope or vomiting.   Chief Complaint  Patient presents with   Medication Problem    Patient states that his gabapentin 10014ms making him lose his memory.     Relevant past medical, surgical, family and social history reviewed and updated as indicated. Interim medical history since our last visit reviewed. Allergies and medications reviewed and updated.  Review of Systems  Constitutional:  Negative for fever.  HENT:  Negative for ear pain, rhinorrhea and sore throat.   Respiratory:  Positive for shortness of breath and wheezing. Negative for hemoptysis and sputum production.   Cardiovascular:  Negative for chest pain, orthopnea, claudication, leg swelling, syncope and PND.  Gastrointestinal:  Negative for abdominal pain and vomiting.  Genitourinary:  Negative for difficulty urinating.   Musculoskeletal:  Negative for arthralgias and back pain.  Neurological:  Negative for dizziness and headaches.  Psychiatric/Behavioral:  Positive for behavioral problems.    Per HPI unless specifically indicated above     Objective:    BP (!) 150/92   Pulse 76   Temp 97.7 F (36.5 C) (Oral)   Ht 5' 6.14" (1.68 m)   Wt 155 lb 6.4 oz (70.5 kg)   SpO2 98%   BMI 24.97 kg/m   Wt Readings from Last 3 Encounters:  12/04/20 155 lb 6.4 oz (70.5 kg)  10/31/20 148 lb 9.6 oz (67.4 kg)  10/27/20 160 lb (72.6 kg)    Physical Exam Vitals and nursing note reviewed.  Constitutional:      General: He is not in acute distress.    Appearance: Normal appearance. He is not ill-appearing or diaphoretic.  HENT:     Head: Normocephalic and atraumatic.     Right Ear: Tympanic membrane and external ear normal. There is no impacted cerumen.     Left Ear: External ear normal.     Nose: No congestion or rhinorrhea.     Mouth/Throat:     Pharynx: No oropharyngeal exudate or posterior oropharyngeal erythema.  Eyes:     Conjunctiva/sclera: Conjunctivae normal.     Pupils: Pupils are equal, round, and reactive to light.  Cardiovascular:     Rate and Rhythm: Normal rate and regular rhythm.     Heart sounds: No murmur heard.  No friction rub. No gallop.  Pulmonary:     Effort: No respiratory distress.     Breath sounds: No stridor. No wheezing or rhonchi.  Chest:     Chest wall: No tenderness.  Abdominal:     General: Abdomen is flat. Bowel sounds are normal.     Palpations: Abdomen is soft. There is no mass.     Tenderness: There is no abdominal tenderness.  Musculoskeletal:     Cervical back: Normal range of motion and neck supple. No rigidity or tenderness.     Left lower leg: No edema.  Skin:    General: Skin is warm and dry.  Neurological:     Mental Status: He is alert.    Results for orders placed or performed in visit on 10/31/20  Comprehensive metabolic panel  Result Value Ref  Range   Glucose 85 65 - 99 mg/dL   BUN 12 8 - 27 mg/dL   Creatinine, Ser 1.04 0.76 - 1.27 mg/dL   eGFR 75 >59 mL/min/1.73   BUN/Creatinine Ratio 12 10 - 24   Sodium 142 134 - 144 mmol/L   Potassium 4.2 3.5 - 5.2 mmol/L   Chloride 106 96 - 106 mmol/L   CO2 19 (L) 20 - 29 mmol/L   Calcium 8.8 8.6 - 10.2 mg/dL   Total Protein 6.3 6.0 - 8.5 g/dL   Albumin 4.0 3.7 - 4.7 g/dL   Globulin, Total 2.3 1.5 - 4.5 g/dL   Albumin/Globulin Ratio 1.7 1.2 - 2.2   Bilirubin Total 0.5 0.0 - 1.2 mg/dL   Alkaline Phosphatase 69 44 - 121 IU/L   AST 12 0 - 40 IU/L   ALT 11 0 - 44 IU/L  CBC with Differential/Platelet  Result Value Ref Range   WBC 8.1 3.4 - 10.8 x10E3/uL   RBC 5.43 4.14 - 5.80 x10E6/uL   Hemoglobin 16.7 13.0 - 17.7 g/dL   Hematocrit 50.7 37.5 - 51.0 %   MCV 93 79 - 97 fL   MCH 30.8 26.6 - 33.0 pg   MCHC 32.9 31.5 - 35.7 g/dL   RDW 14.9 11.6 - 15.4 %   Platelets 183 150 - 450 x10E3/uL   Neutrophils 72 Not Estab. %   Lymphs 17 Not Estab. %   Monocytes 8 Not Estab. %   Eos 1 Not Estab. %   Basos 1 Not Estab. %   Neutrophils Absolute 5.9 1.4 - 7.0 x10E3/uL   Lymphocytes Absolute 1.4 0.7 - 3.1 x10E3/uL   Monocytes Absolute 0.7 0.1 - 0.9 x10E3/uL   EOS (ABSOLUTE) 0.1 0.0 - 0.4 x10E3/uL   Basophils Absolute 0.1 0.0 - 0.2 x10E3/uL   Immature Granulocytes 1 Not Estab. %   Immature Grans (Abs) 0.1 0.0 - 0.1 x10E3/uL  TSH  Result Value Ref Range   TSH 1.280 0.450 - 4.500 uIU/mL        Current Outpatient Medications:    albuterol (VENTOLIN HFA) 108 (90 Base) MCG/ACT inhaler, Inhale 2 puffs into the lungs every 6 (six) hours as needed for wheezing or shortness of breath., Disp: 8.5 g, Rfl: 5   apixaban (ELIQUIS) 5 MG TABS tablet, Take 1 tablet (5 mg total) by mouth 2 (two) times daily., Disp: 60 tablet, Rfl: 3   gabapentin (NEURONTIN) 100 MG capsule, Take 1 capsule (100 mg total) by mouth at bedtime. FOR ANGRY NERVES TO CALM THEM DOWN, Disp: 30 capsule, Rfl: 3    Assessment & Plan:   Dementia: Will reduce Neurontin to 100  mg q hs.  Will refer to neurology for such if gets worse.  Pt is reluctant to see any specialists.  Check Mini Cog test.   Consider starting pt  on aricept 5 mg daily. check B12, folate, TSH, RPR. Patient probably has had severe dementia for a while now. Not been treated for such.   COPD ?> sob per pt has bene smoking since he wa 75 yrs old per his verbal record.  Will start pt on trelegy sec to SOB and wheezing  Declines getting Pfts     Problem List Items Addressed This Visit   None    No orders of the defined types were placed in this encounter.    Meds ordered this encounter  Medications   apixaban (ELIQUIS) 5 MG TABS tablet    Sig: Take 1 tablet (5 mg total) by mouth 2 (two) times daily.    Dispense:  60 tablet    Refill:  3   gabapentin (NEURONTIN) 100 MG capsule    Sig: Take 1 capsule (100 mg total) by mouth at bedtime. FOR ANGRY NERVES TO CALM THEM DOWN    Dispense:  30 capsule    Refill:  3   Fluticasone-Umeclidin-Vilant (TRELEGY ELLIPTA) 100-62.5-25 MCG/INH AEPB    Sig: Inhale 1 puff into the lungs daily.    Dispense:  1 each    Refill:  11     Follow up plan: No follow-ups on file.

## 2020-12-05 ENCOUNTER — Telehealth: Payer: Self-pay | Admitting: Internal Medicine

## 2020-12-05 LAB — VITAMIN B12: Vitamin B-12: 241 pg/mL (ref 232–1245)

## 2020-12-05 LAB — FOLATE: Folate: 7.1 ng/mL (ref >3.0)

## 2020-12-05 LAB — RPR: RPR Ser Ql: NONREACTIVE

## 2020-12-05 NOTE — Telephone Encounter (Signed)
FYI:  Pt came in to the office wanting to know why his Trelegy was costing him a copay of $47.00 and the provider stated that it would be a zero copay.  Pt is not aware that the provider can not keep up with all the cost of each medication.  Pt is aware that this information will be given to Edison Nasuti the Pharmacist to see if there is something that he can assist with the pt getting his medication at a less expensive cost to him and this information will be given to [facility] on Monday 12/11/2020.

## 2020-12-08 ENCOUNTER — Telehealth: Payer: Self-pay

## 2020-12-08 NOTE — Chronic Care Management (AMB) (Signed)
  Chronic Care Management   Note  12/08/2020 Name: Brady Smith MRN: [id number] DOB: 01/26/1946  Brady Smith is a 75 y.o. year old male who is a primary care patient of Vigg, Avanti, MD. Brady Smith is currently enrolled in care management services. An additional referral for Pharm D  was placed.   Follow up plan: Unsuccessful telephone outreach attempt made.  The care management team will reach out to the patient again over the next 7 days.  If patient returns call to provider office, please advise to call [facility]  at [place], [place], [place], [place] [address] Direct Dial: [phone] [email or web address]: [email or web address]

## 2020-12-18 NOTE — Chronic Care Management (AMB) (Signed)
  Chronic Care Management   Note  12/18/2020 Name: Brady Smith MRN: [id number] DOB: [birth year]  Brady Smith is a 75 y.o. year old male who is a primary care patient of Vigg, Avanti, MD. Brady Smith is currently enrolled in care management services. An additional referral for Pharm D was placed.   Follow up plan: Telephone appointment with care management team member scheduled for:12/25/2020  Noreene Larsson, [place], [place], [place] [address] Direct Dial: [phone] [email or web address]

## 2020-12-18 NOTE — Telephone Encounter (Signed)
Patient has been scheduled

## 2020-12-25 ENCOUNTER — Ambulatory Visit (INDEPENDENT_AMBULATORY_CARE_PROVIDER_SITE_OTHER): Payer: Medicare HMO

## 2020-12-25 ENCOUNTER — Telehealth: Payer: Self-pay

## 2020-12-25 DIAGNOSIS — I10 Essential (primary) hypertension: Secondary | ICD-10-CM

## 2020-12-25 DIAGNOSIS — I480 Paroxysmal atrial fibrillation: Secondary | ICD-10-CM

## 2020-12-25 DIAGNOSIS — J449 Chronic obstructive pulmonary disease, unspecified: Secondary | ICD-10-CM

## 2020-12-25 DIAGNOSIS — E782 Mixed hyperlipidemia: Secondary | ICD-10-CM

## 2020-12-25 NOTE — Patient Instructions (Signed)
Brady Smith,  Thank you for talking with me today. I have included our care plan/goals in the following pages.   Please review and call me at [phone] with any questions.  Thanks! Brady Smith, PharmD Clinical Pharmacist  [phone]  Care Plan : [facility] care plan  Updates made by Brady Smith, Brady Smith since 12/25/2020 12:00 AM     Problem: COPD, A fib, Tobacco use   Priority: High     Long-Range Goal: Disease management   Start Date: 12/25/2020  Expected End Date: 12/25/2021  This Visit's Progress: On track  Recent Progress: Not on track  Priority: High  Note:   Hypertension (BP goal <140/90) -Not ideally controlled -Current treatment: No current medications  -Medications previously tried: Lisinopril, amlodipine, diltiazem, metoprolol tartrate -Current home readings: none provided -Denies hypotensive/hypertensive symptoms -Recommend for pt to monitor BP at home 1-2x/day, document, and provide log at future appointments -Would like to review diet/lifestyle at follow-up -No rx changes   Hyperlipidemia: (LDL goal < 100) -Controlled -10-yr ASCVD >30% -Myalgia w/ atorvastatin reported under allergy -Alcohol abuse -Current treatment: No current medications  -Medications previously tried: atorvastatin (myalgia), additional statin hx not noted -Educated on Cholesterol goals;   Atrial Fibrillation (Goal: prevent stroke and major bleeding) -Controlled -Current treatment: Rate control: no current hx Anticoagulation: Eliquis 5 mg BID -Medications previously tried: Metoprolol tartrate, diltiazem  -Home BP and HR readings: not provided  -Counseled on increased risk of stroke due to Afib and benefits of anticoagulation for stroke prevention; Reviewed finances - appears would be eligible for patient assistance on eliquis -Annual income [phone], would need to spend ~520 OOP on medication  -Recommended to continue current medication  COPD (Goal: control  symptoms and prevent exacerbations) -Controlled -Feels breathing I well controled with current inhaler -Current treatment  Trelegy inhaler once daily  -Medications previously tried: n/a  A (low sx, <2 exacerbations/yr) -MMRC/CAT score: incomplete -Exacerbations requiring treatment in last 6 months: 0 -Patient reports consistent use of maintenance inhaler -Frequency of rescue inhaler use: not reported -Counseled on Proper inhaler technique; smoking cessation -Smoking cessation support declined due to patient wanting to stop on his own and previously medications (NRT, bupropion) not working for him  -Assessed patient finances. Agreed to pursue Trelegy patient assistance together  Medication Assistance: Application for trelegy and eliquis  medication assistance program. in process.  Anticipated assistance start date 01/2021.  See plan of care for additional detail.    The patient verbalized understanding of instructions provided today and agreed to receive a MyChart copy of patient instruction and/or educational materials. Telephone follow up appointment with pharmacy team member scheduled for: See next appointment with "Care Management Staff" under "What's Next" below.

## 2020-12-25 NOTE — Progress Notes (Addendum)
Chronic Care Management Pharmacy Note  12/25/2020 Name:  Brady Smith MRN:  [id number] DOB:  08/04/1945  Summary: -Will pursue patient assistance for eliquis and trelegy   Subjective: Brady Smith is an 75 y.o. year old male who is a primary patient of Vigg, Avanti, MD.  The CCM team was consulted for assistance with disease management and care coordination needs.    Engaged with patient by telephone for initial visit in response to provider referral for pharmacy case management and/or care coordination services.   Consent to Services:  The patient was given information about Chronic Care Management services, agreed to services, and gave verbal consent prior to initiation of services.  Please see initial visit note for detailed documentation.   Patient Care Team: Charlynne Cousins, MD as PCP - General (Internal Medicine) Kate Sable, MD as PCP - Cardiology (Cardiology) [place] (Ophthalmology) Birder Robson, MD as Referring Physician (Ophthalmology) [name], LCSW as Social Worker (Licensed Clinical Social Worker)  Objective:  Lab Results  Component Value Date   CREATININE 1.04 10/31/2020   CREATININE 1.15 07/11/2020   CREATININE 1.12 07/04/2020    Lab Results  Component Value Date   HGBA1C 5.3 07/11/2020   Last diabetic Eye exam: No results found for: HMDIABEYEEXA  Last diabetic Foot exam: No results found for: HMDIABFOOTEX      Component Value Date/Time   CHOL 159 08/02/2019 1049   TRIG 165 (H) 08/02/2019 1049   HDL 43 08/02/2019 1049   CHOLHDL 3.7 08/02/2019 1049   Hanna 87 08/02/2019 1049    Hepatic Function Latest Ref Rng & Units 10/31/2020 07/11/2020 12/23/2019  Total Protein 6.0 - 8.5 g/dL 6.3 8.3 7.3  Albumin 3.7 - 4.7 g/dL 4.0 5.1(H) 3.4(L)  AST 0 - 40 IU/L 12 15 31   ALT 0 - 44 IU/L 11 9 31   Alk Phosphatase 44 - 121 IU/L 69 80 73  Total Bilirubin 0.0 - 1.2 mg/dL 0.5 <0.2 1.1  Bilirubin, Direct 0.00 - 0.40 mg/dL - <0.10 -    Lab  Results  Component Value Date/Time   TSH 1.280 10/31/2020 09:20 AM   TSH 1.100 08/02/2019 10:49 AM    CBC Latest Ref Rng & Units 10/31/2020 07/04/2020 12/27/2019  WBC 3.4 - 10.8 x10E3/uL 8.1 7.8 5.3  Hemoglobin 13.0 - 17.7 g/dL 16.7 17.4(H) 10.6(L)  Hematocrit 37.5 - 51.0 % 50.7 52.1(H) 33.0(L)  Platelets 150 - 450 x10E3/uL 183 161 145(L)    No results found for: VD25OH  Clinical ASCVD:  The 10-year ASCVD risk score (Arnett DK, et al., 2019) is: 32.9%   Values used to calculate the score:     Age: 79 years     Sex: Male     Is Non-Hispanic African American: No     Diabetic: No     Tobacco smoker: Yes     Systolic Blood Pressure: 078 mmHg     Is BP treated: No     HDL Cholesterol: 43 mg/dL     Total Cholesterol: 159 mg/dL   Social History   Tobacco Use  Smoking Status Every Day   Packs/day: 0.25   Years: 65.00   Pack years: 16.25   Types: Cigarettes  Smokeless Tobacco Never  Tobacco Comments   reduced # of packs smoked to 5-6 ciggs daily from 3 pks daily    BP Readings from Last 3 Encounters:  12/04/20 (!) 150/92  10/31/20 121/78  08/10/20 (!) 154/81   Pulse Readings from Last 3  Encounters:  12/04/20 76  10/31/20 74  08/10/20 89   Wt Readings from Last 3 Encounters:  12/04/20 155 lb 6.4 oz (70.5 kg)  10/31/20 148 lb 9.6 oz (67.4 kg)  10/27/20 160 lb (72.6 kg)    Assessment: Review of patient past medical history, allergies, medications, health status, including review of consultants reports, laboratory and other test data, was performed as part of comprehensive evaluation and provision of chronic care management services.   SDOH:  (Social Determinants of Health) assessments and interventions performed: Yes   CCM Care Plan  Allergies  Allergen Reactions   Influenza Vaccines     High blood pressure- had to be admitted   Lipitor [Atorvastatin Calcium]     Muscle pain   Covid-19 (Adenovirus) Vaccine Rash    Medications Reviewed Today     Reviewed by  Madelin Rear, [facility] (Pharmacist) on 12/25/20 at 1256  Med List Status: <None>   Medication Order Taking? Sig Documenting Provider Last Dose Status Informant  albuterol (VENTOLIN HFA) 108 (90 Base) MCG/ACT inhaler [id number] Yes Inhale 2 puffs into the lungs every 6 (six) hours as needed for wheezing or shortness of breath. Vigg, Avanti, MD  Active   apixaban (ELIQUIS) 5 MG TABS tablet [id number] Yes Take 1 tablet (5 mg total) by mouth 2 (two) times daily. Vigg, Avanti, MD  Active   Fluticasone-Umeclidin-Vilant (TRELEGY ELLIPTA) 100-62.5-25 MCG/INH AEPB [id number] Yes Inhale 1 puff into the lungs daily. Vigg, Avanti, MD  Active   gabapentin (NEURONTIN) 100 MG capsule [id number]  Take 1 capsule (100 mg total) by mouth at bedtime. FOR ANGRY NERVES TO CALM THEM DOWN Charlynne Cousins, MD  Active            Patient Active Problem List   Diagnosis Date Noted   Chronic diarrhea 10/31/2020   Peripheral neuropathy 10/31/2020   Dyspepsia 01/18/2020   Primary insomnia 01/18/2020   COPD with acute exacerbation ([place]) 12/23/2019   Pulmonary embolism ([place]) 12/23/2019   Tobacco abuse 12/04/2019   Dermatitis 11/24/2019   Senile ecchymosis 08/02/2019   B12 nutritional deficiency 06/07/2019   Memory loss 04/24/2018   Excoriation of scalp 04/24/2018   Paroxysmal atrial fibrillation ([place]) 06/13/2017   S/P amputation of lesser toe ([place]) 07/25/2015   Peripheral vascular disease ([place]) 44/81/8563   Toxic metabolic encephalopathy 14/97/0263   AA (alcohol abuse) 05/23/2015   SBO (small bowel obstruction) ([place]) 05/18/2015   Urinary retention 05/18/2015   Essential hypertension 05/18/2015   Choroidal nevus, right eye 02/08/2015   Chronic obstructive pulmonary disease ([place]) 02/08/2015   Carpal tunnel syndrome 01/13/2015   H/O gastrointestinal disease 01/13/2015   History of primary malignant neoplasm of urinary bladder 01/13/2015   Compulsive tobacco user syndrome 01/13/2015   Nicotine addiction 01/13/2015   HLD  (hyperlipidemia) 01/13/2015   Neurosis, phobic 01/13/2015   Lung nodule, solitary 06/11/2011    Immunization History  Administered Date(s) Administered   Influenza, High Dose Seasonal PF 01/23/2017   PFIZER(Purple Top)SARS-COV-2 Vaccination 10/05/2019, 11/08/2019   Pneumococcal Conjugate-13 02/25/2018   Pneumococcal Polysaccharide-23 01/24/2017   Conditions to be addressed/monitored: Afib, HTN, HLD, COPD, and Nicotine abuse  Care Plan : CCM Pharmacy care plan  Updates made by Madelin Rear, [place] since 12/25/2020 12:00 AM     Problem: COPD, A fib, Tobacco use   Priority: High     Long-Range Goal: Disease management   Start Date: 12/25/2020  Expected End Date: 12/25/2021  This Visit's Progress: On track  Recent Progress: Not on track  Priority: High  Note:   Hypertension (BP goal <140/90) -Not ideally controlled -Current treatment: No current medications  -Medications previously tried: Lisinopril, amlodipine, diltiazem, metoprolol tartrate -Current home readings: none provided -Denies hypotensive/hypertensive symptoms -Recommend for pt to monitor BP at home 1-2x/day, document, and provide log at future appointments -Would like to review diet/lifestyle at follow-up -No rx changes   Hyperlipidemia: (LDL goal < 100) -Controlled -10-yr ASCVD >30% -Myalgia w/ atorvastatin reported under allergy -Alcohol abuse -Current treatment: No current medications  -Medications previously tried: atorvastatin (myalgia), additional statin hx not noted -Educated on Cholesterol goals;   Atrial Fibrillation (Goal: prevent stroke and major bleeding) -Controlled -Current treatment: Rate control: no current hx Anticoagulation: Eliquis 5 mg BID -Medications previously tried: Metoprolol tartrate, diltiazem  -Home BP and HR readings: not provided  -Counseled on increased risk of stroke due to Afib and benefits of anticoagulation for stroke prevention; Reviewed finances - appears would be  eligible for patient assistance on eliquis -Annual income [phone], would need to spend ~520 OOP on medication  -Recommended to continue current medication  COPD (Goal: control symptoms and prevent exacerbations) -Controlled -Feels breathing I well controled with current inhaler -Current treatment  Trelegy inhaler once daily  -Medications previously tried: n/a  A (low sx, <2 exacerbations/yr) -MMRC/CAT score: incomplete -Exacerbations requiring treatment in last 6 months: 0 -Patient reports consistent use of maintenance inhaler -Frequency of rescue inhaler use: not reported -Counseled on Proper inhaler technique; smoking cessation -Smoking cessation support declined due to patient wanting to stop on his own and previously medications (NRT, bupropion) not working for him  -Assessed patient finances. Agreed to pursue Trelegy patient assistance together  Medication Assistance: Application for trelegy and eliquis  medication assistance program. in process.  Anticipated assistance start date 01/2021.  See plan of care for additional detail.     Patient's preferred pharmacy is:  [facility] [address] [place] [address] Phone: [phone] Fax: [place], [place] - [address] [place] [address] Phone: [phone] Fax: [place], [place] - [place] [address] Phone: [phone] Fax: [phone]  Pt endorses 100% compliance  Follow Up:  Patient agrees to Care Plan and Follow-up.  Plan:  CMA complete patient assistance. 1 month f/u to check on completion. [place] f/u 3 months for further review of HLD COPD  Future Appointments  Date Time Provider [place]  01/01/2021 10:20 AM Charlynne Cousins, MD [facility] P[facility]  04/09/2021  3:00 PM CFP CCM PHARMACY [facility] P[facility]  10/29/2021  3:15 PM CFP  NURSE HEALTH ADVISOR CFP-CFP [place], PharmD, [place] Pharmacist  [phone]

## 2020-12-25 NOTE — Chronic Care Management (AMB) (Signed)
    Chronic Care Management Pharmacy Assistant   Name: ANASTACIO BUA  MRN: [id number] DOB: Mar 01, 1946  AIDEN HELZER is an 75 y.o. year old male who presents for his initial CCM visit with the clinical pharmacist.   Recent office visits:  12/04/20 Charlynne Cousins MD - Seen for memory loss - Labs ordered - reduce Neurontin to 100 mg q hs - Start Trelegy 100-62.5-25 MCG/INH AEPB, 1 puff daily - Follow up in 4 weeks  10/31/20 [place] for itching - Labs ordered - Referral to gastroenterology - Restart gabapentin - Follow up in 4 weeks  09/11/20 Charlynne Cousins MD - Seen for Chronic obstructive pulmonary disease - No notes available  08/10/20 Charlynne Cousins MD - Seen for Atrial fibrillation - Referral to cardiology - No medication changes noted - Follow up in 3 months  07/11/20 Avanti Vigg MD - Seen for Paronychia of finger of right hand - Labs ordered - Started Gabapentin 600 mg - Started Bactrim 800-160 MG tablet - Follow up in 3 weeks  07/10/20 Avanti Vigg MD - Seen for chest pain - EKG ordered - Advised to ED for afib and possible embolus (patient denied) -  No follow up noted   Recent consult visits:  None noted   Hospital visits:   Admitted to the hospital on 07/11/20 due to Paronychia of finger of left hand. Discharge date was 07/11/20. Discharged from [facility]?Medications Started at [facility] Discharge:?? N/a  Medication Changes at Hospital Discharge: N/a  Medications Discontinued at Hospital Discharge: N/a  Medications that remain the same after Hospital Discharge:??  -All other medications will remain the same.     Admitted to the hospital on 07/04/20 due to shortness of breath. Discharge date was 07/04/20. Discharged from [place]?Medications Started at [facility] Discharge:?? N/a  Medication Changes at Hospital Discharge: N/a  Medications Discontinued at Hospital  Discharge: N/a  Medications that remain the same after Hospital Discharge:??  -All other medications will remain the same.    Medications: Outpatient Encounter Medications as of 12/25/2020  Medication Sig   albuterol (VENTOLIN HFA) 108 (90 Base) MCG/ACT inhaler Inhale 2 puffs into the lungs every 6 (six) hours as needed for wheezing or shortness of breath.   apixaban (ELIQUIS) 5 MG TABS tablet Take 1 tablet (5 mg total) by mouth 2 (two) times daily.   Fluticasone-Umeclidin-Vilant (TRELEGY ELLIPTA) 100-62.5-25 MCG/INH AEPB Inhale 1 puff into the lungs daily.   gabapentin (NEURONTIN) 100 MG capsule Take 1 capsule (100 mg total) by mouth at bedtime. FOR ANGRY NERVES TO CALM THEM DOWN   No facility-administered encounter medications on file as of 12/25/2020.   Care Gaps: None  albuterol (VENTOLIN HFA) 108 (90 Base) MCG/ACT inhaler last fill 12/01/20 apixaban (ELIQUIS) 5 MG TABS tablet last fill 12/04/20 30 DS  Fluticasone-Umeclidin-Vilant (TRELEGY ELLIPTA) 100-62.5-25 MCG/INH AEPB last fill 12/04/20 gabapentin (NEURONTIN) 100 MG capsule last fill 12/04/20 30 DS    [name] Rating Drugs: None    Andee Poles, CMA

## 2020-12-26 DIAGNOSIS — J441 Chronic obstructive pulmonary disease with (acute) exacerbation: Secondary | ICD-10-CM | POA: Diagnosis not present

## 2020-12-28 ENCOUNTER — Telehealth: Payer: Self-pay

## 2020-12-28 NOTE — Progress Notes (Signed)
    Chronic Care Management Pharmacy Assistant   Name: Brady Smith  MRN: [id number] DOB: 1945-09-19  Attempted to call patient to notify him that Patient assistance forms have been sent out for medications Eliquis and Trelegy. With the forms that were sent out I informed the patient to ensure that he fills out all the highlighted sections, attach a copy of his income verification, and when complete to either mail or bring forms to his primary care office.   Andee Poles, CMA

## 2021-01-01 ENCOUNTER — Ambulatory Visit: Payer: Medicare HMO | Admitting: Internal Medicine

## 2021-01-05 ENCOUNTER — Ambulatory Visit (INDEPENDENT_AMBULATORY_CARE_PROVIDER_SITE_OTHER): Payer: Medicare HMO | Admitting: Internal Medicine

## 2021-01-05 ENCOUNTER — Other Ambulatory Visit: Payer: Self-pay

## 2021-01-05 DIAGNOSIS — T148XXA Other injury of unspecified body region, initial encounter: Secondary | ICD-10-CM

## 2021-01-05 DIAGNOSIS — R059 Cough, unspecified: Secondary | ICD-10-CM | POA: Diagnosis not present

## 2021-01-05 DIAGNOSIS — L299 Pruritus, unspecified: Secondary | ICD-10-CM

## 2021-01-05 MED ORDER — HYDROXYZINE PAMOATE 25 MG PO CAPS: 25.0000 mg | ORAL_CAPSULE | Freq: Three times a day (TID) | ORAL | 0 refills | Status: DC | PRN

## 2021-01-05 NOTE — Progress Notes (Signed)
There were no vitals taken for this visit.   Subjective:    Patient ID: Brady Smith, male    DOB: May 19, 1945, 75 y.o.   MRN: [id number]  No chief complaint on file.   HPI: Brady Smith is a 75 y.o. male  Pt was exposed to a person whos daughter in law   Cough This is a new problem. The current episode started yesterday. Pertinent negatives include no chest pain, chills, ear congestion, ear pain, fever, headaches, heartburn, hemoptysis, myalgias, nasal congestion, postnasal drip, rash, rhinorrhea, sore throat, shortness of breath, sweats, weight loss or wheezing.   No chief complaint on file.   Relevant past medical, surgical, family and social history reviewed and updated as indicated. Interim medical history since our last visit reviewed. Allergies and medications reviewed and updated.  Review of Systems  Constitutional:  Negative for chills, fever and weight loss.  HENT:  Negative for ear pain, postnasal drip, rhinorrhea and sore throat.   Respiratory:  Positive for cough. Negative for hemoptysis, shortness of breath and wheezing.   Cardiovascular:  Negative for chest pain.  Gastrointestinal:  Negative for heartburn.  Musculoskeletal:  Negative for myalgias.  Skin:  Negative for rash.  Neurological:  Negative for headaches.   Per HPI unless specifically indicated above     Objective:    There were no vitals taken for this visit.  Wt Readings from Last 3 Encounters:  12/04/20 155 lb 6.4 oz (70.5 kg)  10/31/20 148 lb 9.6 oz (67.4 kg)  10/27/20 160 lb (72.6 kg)    Physical Exam Unable to peform sec to virtual visit.     Results for orders placed or performed in visit on 12/04/20  Vitamin B12  Result Value Ref Range   Vitamin B-12 241 232 - 1,245 pg/mL  RPR  Result Value Ref Range   RPR Ser Ql Non Reactive Non Reactive  Folate  Result Value Ref Range   Folate 7.1 >3.0 ng/mL        Current Outpatient Medications:    albuterol (VENTOLIN HFA) 108 (90  Base) MCG/ACT inhaler, Inhale 2 puffs into the lungs every 6 (six) hours as needed for wheezing or shortness of breath., Disp: 8.5 g, Rfl: 5   apixaban (ELIQUIS) 5 MG TABS tablet, Take 1 tablet (5 mg total) by mouth 2 (two) times daily., Disp: 60 tablet, Rfl: 3   Fluticasone-Umeclidin-Vilant (TRELEGY ELLIPTA) 100-62.5-25 MCG/INH AEPB, Inhale 1 puff into the lungs daily., Disp: 1 each, Rfl: 11   gabapentin (NEURONTIN) 100 MG capsule, Take 1 capsule (100 mg total) by mouth at bedtime. FOR ANGRY NERVES TO CALM THEM DOWN, Disp: 30 capsule, Rfl: 3    Assessment & Plan:  Itching : ? Bug bites Will refer to dermatology.  2. Wheezing is on trelegy/ Albuterol.  Exposure to covid 19  Increase fluid intake.  Use  tyelnol every 4-6 hrs prn and alternate this with ibubrufen 800 mg q 8 hrly. Sinus pressure: use steam inhalation.  OTC -  Allegra / claritin.  5 days quarantine.  Ok to rtw in 5 days if tests -ve follow  Pt will need to come in wait in the car and get swabbed for flu and COVID test today  Pt verbalized understanding of such, to get to the office at today and get a curb side test for the above.   Problem List Items Addressed This Visit   None Visit Diagnoses     Cough, unspecified type    -  Primary   Relevant Orders   Novel Coronavirus, NAA ([facility])        Orders Placed This Encounter  Procedures   Novel Coronavirus, NAA ([facility])     Meds ordered this encounter  Medications   hydrOXYzine (VISTARIL) 25 MG capsule    Sig: Take 1 capsule (25 mg total) by mouth every 8 (eight) hours as needed.    Dispense:  30 capsule    Refill:  0     Follow up plan: No follow-ups on file.  {Blank single:19197::"This vi`sit was completed via telephone due to the restrictions of the COVID-19 pandemic. All issues as above were discussed and addressed but no physical exam was performed. If it was felt that the patient should be evaluated in the office, they were directed there. The patient  verbally consented to this visit. Patient was unable to complete an audio/visual visit due to Technical difficulties", "Lack of internet. Due to the catastrophic nature of the COVID-19 pandemic, this visit was done through audio contact only.","This visit was completed via telephone  through [facility] due to the restrictions of the COVID-19 pandemic. All issues as above were discussed and addressed.  If it was felt that the patient should be evaluated in the office, they were directed there. The patient verbally consented to this visit Location of the patient: parking lot Location of the provider: work       Those involved with this call:  Provider: Charlynne Cousins, MD CMA: Frazier Butt, [place] Desk/Registration: Myrlene Broker  Time spent on call:  10 minutes on the phone discussing health concerns. 10 minutes total spent in review of patient's record and preparation of their chart.

## 2021-01-06 LAB — NOVEL CORONAVIRUS, NAA: SARS-CoV-2, NAA: NOT DETECTED

## 2021-01-06 LAB — SARS-COV-2, NAA 2 DAY TAT

## 2021-01-08 ENCOUNTER — Encounter: Payer: Self-pay | Admitting: Internal Medicine

## 2021-01-08 ENCOUNTER — Telehealth: Payer: Self-pay

## 2021-01-08 NOTE — Telephone Encounter (Signed)
PA for hydroxyzine  has been approved  from 06/23/20 to 03/24/21

## 2021-01-08 NOTE — Progress Notes (Signed)
Please let pt know this was normal.

## 2021-01-08 NOTE — Telephone Encounter (Signed)
PA was started for Hydroxyzine 25 mg cap through cover my meds. Awaiting on determination.

## 2021-01-12 ENCOUNTER — Telehealth: Payer: Self-pay

## 2021-01-12 NOTE — Chronic Care Management (AMB) (Signed)
  Chronic Care Management   Note  01/12/2021 Name: Brady Smith MRN: [id number] DOB: [birth year]  Brady Smith is a 75 y.o. year old male who is a primary care patient of Vigg, Avanti, MD. Brady Smith is currently enrolled in care management services. An additional referral for LCSW was placed.   Follow up plan: Unsuccessful telephone outreach attempt made. A HIPAA compliant phone message was left for the patient providing contact information and requesting a return call.  The care management team will reach out to the patient again over the next 7 days.  If patient returns call to provider office, please advise to call [facility]  at [place], [place], [place], [place] [address] Direct Dial: [phone] [email or web address]: [email or web address]

## 2021-01-17 NOTE — Chronic Care Management (AMB) (Signed)
  Chronic Care Management   Note  01/17/2021 Name: Brady Smith MRN: [id number] DOB: [birth year]  Brady Smith is a 75 y.o. year old male who is a primary care patient of Vigg, Avanti, MD. Brady Smith is currently enrolled in care management services. An additional referral for LCSW was placed.   Follow up plan: Unsuccessful telephone outreach attempt made. The care management team will reach out to the patient again over the next 7 days.  If patient returns call to provider office, please advise to call [facility]  at [place], [place], [place], [place] [address] Direct Dial: [phone] [email or web address] Website: [email or web address]

## 2021-01-18 ENCOUNTER — Telehealth: Payer: Self-pay | Admitting: Internal Medicine

## 2021-01-18 NOTE — Telephone Encounter (Signed)
Pt came in needing assistance completing paperwork that  was sent by Edison Nasuti. Pt would like Edison Nasuti to call him and explain why he was sent the paperwork and may need help completing it.

## 2021-01-22 DIAGNOSIS — I480 Paroxysmal atrial fibrillation: Secondary | ICD-10-CM

## 2021-01-22 DIAGNOSIS — J449 Chronic obstructive pulmonary disease, unspecified: Secondary | ICD-10-CM | POA: Diagnosis not present

## 2021-01-22 DIAGNOSIS — E782 Mixed hyperlipidemia: Secondary | ICD-10-CM | POA: Diagnosis not present

## 2021-01-22 DIAGNOSIS — I10 Essential (primary) hypertension: Secondary | ICD-10-CM

## 2021-01-23 ENCOUNTER — Telehealth: Payer: Self-pay | Admitting: Internal Medicine

## 2021-01-23 NOTE — Telephone Encounter (Signed)
Please see below and advise as needed.

## 2021-01-23 NOTE — Telephone Encounter (Signed)
Pt called and is requesting to have BP medication sent into the pharmacy for him. Please advise.        [place], [place] - [place] [address]  [address] [place] [address]  Phone: [phone] Fax: [phone]  Hours: Not open 24 hours

## 2021-01-23 NOTE — Progress Notes (Signed)
Attempted to call patient to go over Patient assistance requirements and answer any concerns. Resent out patient assistance forms for Eliquis and Trelegy on 01/26/21 . Attached my contact information as well as the instructions for the forms. Out of pocket expense report has already been sent to the office with attention to Madelin Rear. CPP has been notified of [place] fax from pharmacy.

## 2021-01-24 NOTE — Telephone Encounter (Signed)
Sure thing can refill doent need to come in not sure which med tho please find out thnx.

## 2021-01-24 NOTE — Telephone Encounter (Signed)
Called patient to clarify which medication he is currently taking for Hypertension. Patient voicemail is not set up and unable to leave voice message for patient to give our office a call back.

## 2021-01-25 NOTE — Chronic Care Management (AMB) (Signed)
  Chronic Care Management   Note  01/25/2021 Name: Brady Smith MRN: [id number] DOB: 14-Oct-1945  Brady Smith is a 75 y.o. year old male who is a primary care patient of Vigg, Avanti, MD. Brady Smith is currently enrolled in care management services. An additional referral for LCSW was placed.   Follow up plan: Unable to make contact on outreach attempts x 3. PCP LCSW notified via routed documentation in medical record.   Noreene Larsson, [place], [place], [place] [address] Direct Dial: [phone] [email or web address] Website: [email or web address]

## 2021-01-25 NOTE — Telephone Encounter (Signed)
Called patient to clarify which medication he is currently taking for Hypertension. Patient voicemail is not set up and unable to leave voice message for patient to give our office a call back.

## 2021-01-26 DIAGNOSIS — J441 Chronic obstructive pulmonary disease with (acute) exacerbation: Secondary | ICD-10-CM | POA: Diagnosis not present

## 2021-02-01 ENCOUNTER — Telehealth: Payer: Self-pay

## 2021-02-01 NOTE — Chronic Care Management (AMB) (Signed)
    Chronic Care Management Pharmacy Assistant   Name: MASSIMILIANO ROHLEDER  MRN: [id number] DOB: 01/29/1946  Reason for Encounter: Patient assistance   Patient called and states that he received patient assistance forms but needed help filling them out. He was confused as to what the forms were for. I explained to him what the forms are for and the process for it. I made an appointment with CPP to assist with forms on 02/26/21 @ 9 am.     Andee Poles, CMA

## 2021-02-13 ENCOUNTER — Telehealth: Payer: Self-pay

## 2021-02-13 NOTE — Chronic Care Management (AMB) (Signed)
  Care Management   Note  02/13/2021 Name: ODEN LINDAMAN MRN: [id number] DOB: 1945-09-28  [name] Trompeter is a 75 y.o. year old male who is a primary care patient of Vigg, Avanti, MD and is actively engaged with the care management team. I reached out to [name] by phone today to assist with re-scheduling a follow up visit with the Pharmacist  Follow up plan: Unsuccessful telephone outreach attempt made. A HIPAA compliant phone message was left for the patient providing contact information and requesting a return call.  The care management team will reach out to the patient again over the next 7 days.  If patient returns call to provider office, please advise to call [facility]  at [place], [place], [place], [place] [address] Direct Dial: [phone] [email or web address]: [email or web address]

## 2021-02-21 ENCOUNTER — Ambulatory Visit: Payer: Self-pay

## 2021-02-21 NOTE — Telephone Encounter (Signed)
Pt calling in stating that he thinks he's got another blood clot in his RLE and wants to get it checked out. Says he has a hx of blood clots previously. Reports that he always has pain in his legs so pain isn't worse but he had a knot come up on his RLE that he thinks may be a blood clot. Advised pt he should be seen today so attemped to schedule appt for today, first available was 02/22/21 at 1100 with Dr. Neomia Dear. Called and spoke with Cristie Hem, [facility] who states nothing available as well to send CRM and if pt needs to be seen today, will call and let him know. Advised pt of this information. Pt wanted to schedule appt for tomorrow so he has time to wash up good before his appt. Scheduled appt at 1100 on 02/22/21. Advised him if needed, we will reach back out. Care advice given and pt verbalized understanding. No other questions/concerns noted.     Reason for Disposition  History of prior "blood clot" in leg or lungs (i.e., deep vein thrombosis, pulmonary embolism)  Answer Assessment - Initial Assessment Questions 1. ONSET: "When did the pain start?"      Couple of days ago 2. LOCATION: "Where is the pain located?"      R lower leg 3. PAIN: "How bad is the pain?"    (Scale 1-10; or mild, moderate, severe)   -  MILD (1-3): doesn't interfere with normal activities    -  MODERATE (4-7): interferes with normal activities (e.g., work or school) or awakens from sleep, limping    -  SEVERE (8-10): excruciating pain, unable to do any normal activities, unable to walk     max 4. WORK OR EXERCISE: "Has there been any recent work or exercise that involved this part of the body?"      No 5. CAUSE: "What do you think is causing the leg pain?"     Possible clot  6. OTHER SYMPTOMS: "Do you have any other symptoms?" (e.g., chest pain, back pain, breathing difficulty, swelling, rash, fever, numbness, weakness)     No 7. PREGNANCY: "Is there any chance you are pregnant?" "When was your last menstrual period?"      NA  Protocols used: Leg Pain-A-AH

## 2021-02-22 ENCOUNTER — Ambulatory Visit: Payer: Medicare HMO | Admitting: Internal Medicine

## 2021-02-22 ENCOUNTER — Telehealth: Payer: Self-pay

## 2021-02-22 ENCOUNTER — Other Ambulatory Visit: Payer: Self-pay

## 2021-02-22 NOTE — Telephone Encounter (Signed)
Error

## 2021-02-25 DIAGNOSIS — J441 Chronic obstructive pulmonary disease with (acute) exacerbation: Secondary | ICD-10-CM | POA: Diagnosis not present

## 2021-02-26 ENCOUNTER — Ambulatory Visit: Payer: Medicare HMO

## 2021-02-26 ENCOUNTER — Telehealth: Payer: Self-pay

## 2021-02-26 ENCOUNTER — Telehealth: Payer: Self-pay | Admitting: Internal Medicine

## 2021-02-26 NOTE — Telephone Encounter (Signed)
Pt came into the office asking for Edison Nasuti and was upset because his paperwork is due today and he needed help with it. I helped him fill out his part and I let pt know he needed his income information. I placed the paperwork in provider's folder to be signed.  Pt is unhappy because he states nobody helped him get this done in time.

## 2021-02-26 NOTE — Telephone Encounter (Signed)
  Care Management   Follow Up Note   02/26/2021 Name: KEDRIC BUMGARNER MRN: [id number] DOB: 11/27/1945   Referred by: Charlynne Cousins, MD Reason for referral : Chronic Care Management (RNCM: Response for help in paperwork needs for patient assistance information- pharmacy)   [place] desk staff contacted the [facility] for assistance in helping the patient with patient assistance paperwork. Secure email sent to [facility] asking for follow up to the patient to help the patient with paperwork needs. The patient brought paperwork to the office today at [facility] as requested from pharm Roxan Diesel.  RNCM advised office staff of information being sent to [facility] for assistance with the needs of the patient with his paperwork for patient assistance.   Follow Up Plan: The care management team will reach out to the patient again over the next 1 to 14 days.   Noreene Larsson RN, MSN, [facility] Mobile: [phone]

## 2021-02-26 NOTE — Telephone Encounter (Signed)
I assume this is for the Trelegy and Eliquis applications the patient was sent November 1st?   Eliquis ([place]): -If patient initialed the income section, we only need to attach the out-of-pocket spend report which was faxed from the pharmacy in October and is scanned into the media tab. Once provider signs this application, the OOP spend report should be added and the forms can be faxed to [employer]: [phone]  Trelegy ([place]) -this application requires both proof of income and out-of-pocket report. It also requires a printed Rx signed by the prescriber to be faxed along with the application. Once that is complete, the application can be faxed to [place]: [phone]  Brady Smith - can you make sure patient is aware he needs to bring proof of income, we already have his out of pocket costs faxed from the pharmacy. As for him being upset, it looks like someone did try to call him a week or 2 ago to reschedule his appt today with Edison Nasuti and help with needs, unfortunately no one was able to reach him.

## 2021-03-01 NOTE — Telephone Encounter (Signed)
Paperwork was faxed to [employer] yesterday and again today.

## 2021-03-03 ENCOUNTER — Other Ambulatory Visit: Payer: Self-pay

## 2021-03-03 ENCOUNTER — Emergency Department
Admission: EM | Admit: 2021-03-03 | Discharge: 2021-03-03 | Disposition: A | Discharge status: Home / Self Care | Payer: Medicare HMO | Attending: Emergency Medicine | Admitting: Emergency Medicine

## 2021-03-03 ENCOUNTER — Emergency Department: Payer: Medicare HMO

## 2021-03-03 DIAGNOSIS — R0602 Shortness of breath: Secondary | ICD-10-CM | POA: Diagnosis not present

## 2021-03-03 DIAGNOSIS — Z5321 Procedure and treatment not carried out due to patient leaving prior to being seen by health care provider: Secondary | ICD-10-CM | POA: Diagnosis not present

## 2021-03-03 DIAGNOSIS — J449 Chronic obstructive pulmonary disease, unspecified: Secondary | ICD-10-CM | POA: Diagnosis not present

## 2021-03-03 LAB — CBC
HCT: 51.1 % (ref 39.0–52.0)
Hemoglobin: 16.9 g/dL (ref 13.0–17.0)
MCH: 32.1 pg (ref 26.0–34.0)
MCHC: 33.1 g/dL (ref 30.0–36.0)
MCV: 97.1 fL (ref 80.0–100.0)
Platelets: 141 10*3/uL — ABNORMAL LOW (ref 150–400)
RBC: 5.26 MIL/uL (ref 4.22–5.81)
RDW: 13.5 % (ref 11.5–15.5)
WBC: 8.3 10*3/uL (ref 4.0–10.5)
nRBC: 0 % (ref 0.0–0.2)

## 2021-03-03 LAB — BASIC METABOLIC PANEL
Anion gap: 7 (ref 5–15)
BUN: 14 mg/dL (ref 8–23)
CO2: 25 mmol/L (ref 22–32)
Calcium: 9.4 mg/dL (ref 8.9–10.3)
Chloride: 108 mmol/L (ref 98–111)
Creatinine, Ser: 0.94 mg/dL (ref 0.61–1.24)
GFR, Estimated: >60 mL/min (ref >60)
Glucose, Bld: 79 mg/dL (ref 70–99)
Potassium: 4.7 mmol/L (ref 3.5–5.1)
Sodium: 140 mmol/L (ref 135–145)

## 2021-03-03 LAB — PROTIME-INR
INR: 0.9 (ref 0.8–1.2)
Prothrombin Time: 12.5 seconds (ref 11.4–15.2)

## 2021-03-03 LAB — TROPONIN I (HIGH SENSITIVITY): Troponin I (High Sensitivity): 5 ng/L (ref <18)

## 2021-03-03 NOTE — ED Triage Notes (Signed)
Pt states he has been having SHOB "for a long time" but it has gotten worse over the past couple days and is concerned he may have a blood clot as he has a hx of blood clots

## 2021-03-03 NOTE — ED Notes (Signed)
Pt reported to front desk staff that he was leaving and left his bracelet.

## 2021-03-05 ENCOUNTER — Encounter: Payer: Self-pay | Admitting: Nurse Practitioner

## 2021-03-05 ENCOUNTER — Other Ambulatory Visit: Payer: Self-pay

## 2021-03-05 ENCOUNTER — Ambulatory Visit (INDEPENDENT_AMBULATORY_CARE_PROVIDER_SITE_OTHER): Payer: Medicare HMO | Admitting: Nurse Practitioner

## 2021-03-05 VITALS — BP 165/69 | HR 62 | Temp 97.9°F | Wt 165.0 lb

## 2021-03-05 DIAGNOSIS — R32 Unspecified urinary incontinence: Secondary | ICD-10-CM | POA: Diagnosis not present

## 2021-03-05 DIAGNOSIS — I10 Essential (primary) hypertension: Secondary | ICD-10-CM

## 2021-03-05 MED ORDER — LISINOPRIL 5 MG PO TABS: 5.0000 mg | ORAL_TABLET | Freq: Every day | ORAL | 0 refills | Status: DC

## 2021-03-05 NOTE — Patient Instructions (Signed)
Start lisinopril 1 tablet daily for your blood pressure

## 2021-03-05 NOTE — Progress Notes (Signed)
Established Patient Office Visit  Subjective:  Patient ID: Brady Smith, male    DOB: 22-Aug-1945  Age: 75 y.o. MRN: [id number]  CC:  Chief Complaint  Patient presents with   Hospitalization Follow-up    Patient went to ED for shortness of breath and wanted to know if he still had blood clots in his lungs but did not wait to be seen. Patient was told his BP was high and needed to follow up with PCP.     HPI Brady Smith presents for follow-up with high blood pressure after going to the ER on 03/03/21. He left without being seen by a provider. He does not check his blood pressure at home. He endorses intermittent shortness of breath but says this is from his COPD and emphysema. He denies chest pain and dizziness. He does have mild, intermittent headaches. He does not remember being on any blood pressure medication in the past.   Past Medical History:  Diagnosis Date   Acute on chronic respiratory failure with hypoxia ([place]) 12/23/2019   Acute respiratory failure with hypoxia ([place]) 12/23/2019   Anxiety    Bladder cancer ([place])    CAP (community acquired pneumonia) 12/04/2019   COPD (chronic obstructive pulmonary disease) (HCC)    COPD exacerbation ([place]) 06/11/2011   Overview:  Overview:  Followed in Pulmonary clinic/ [facility]/ [place]    - HFA < 25% 06/10/11  Last Assessment & Plan:  Extremely poor hfa technique gives me hope that he really isn't that bad and needs to focus his attention on smoking cessation  I took an extended  opportunity with this patient to outline the consequences of continued cigarette use  in airway disorders based on all the dat   Depression    GERD (gastroesophageal reflux disease)    History of kidney cancer    Peripheral vascular disease ([place])    Pneumonia     Past Surgical History:  Procedure Laterality Date   AMPUTATION Right 07/19/2015   Procedure: AMPUTATION DIGIT ( RIGHT FOOT FIFTH TOE );  Surgeon: Algernon Huxley, MD;  Location: [facility];  Service:  Vascular;  Laterality: Right;   BOWEL RESECTION  05/20/2015   Procedure: SMALL BOWEL RESECTION;  Surgeon: Clayburn Pert, MD;  Location: [facility];  Service: General;;   CATARACT EXTRACTION W/PHACO Left 10/06/2018   Procedure: CATARACT EXTRACTION PHACO AND INTRAOCULAR LENS PLACEMENT ([place])  LEFT;  Surgeon: Birder Robson, MD;  Location: [place];  Service: Ophthalmology;  Laterality: Left;  DAY OF TEST. LIVES IN BOARDING HOUSE   CATARACT EXTRACTION W/PHACO Right 10/27/2018   Procedure: CATARACT EXTRACTION PHACO AND INTRAOCULAR LENS PLACEMENT ([place])  RIGHT;  Surgeon: Birder Robson, MD;  Location: [place];  Service: Ophthalmology;  Laterality: Right;  PT HAS TO HAVE COVID TEST MORNING OF LEAVE AT LAST PATIENT   CHOLECYSTECTOMY     COLONOSCOPY  2000   CYSTECTOMY W/ CONTINENT DIVERSION  1996   HERNIA REPAIR     LAPAROTOMY N/A 05/20/2015   Procedure: EXPLORATORY LAPAROTOMY;  Surgeon: Clayburn Pert, MD;  Location: [facility];  Service: General;  Laterality: N/A;   PERIPHERAL VASCULAR CATHETERIZATION N/A 07/03/2015   Procedure: Abdominal Aortogram w/Lower Extremity;  Surgeon: Algernon Huxley, MD;  Location: [place] [facility];  Service: Cardiovascular;  Laterality: N/A;   PERIPHERAL VASCULAR CATHETERIZATION  07/03/2015   Procedure: Lower Extremity Intervention;  Surgeon: Algernon Huxley, MD;  Location: [place] [facility];  Service: Cardiovascular;;   PROSTATECTOMY  1996   PULMONARY THROMBECTOMY N/A 12/24/2019   Procedure: PULMONARY THROMBECTOMY / THROMBOLYSIS;  Surgeon: Katha Cabal, MD;  Location: [place] [facility];  Service: Cardiovascular;  Laterality: N/A;   TONSILLECTOMY      Family History  Problem Relation Age of Onset   Heart disease Mother    Heart disease Father    COPD Sister     Social History   Socioeconomic History   Marital status: Widowed    Spouse name: Not on file   Number of children: 2   Years of education: Not on file   Highest education  level: 8th grade  Occupational History   Occupation: disabled  Tobacco Use   Smoking status: Every Day    Packs/day: 0.25    Years: 65.00    Pack years: 16.25    Types: Cigarettes   Smokeless tobacco: Never   Tobacco comments:    reduced # of packs smoked to 5-6 ciggs daily from 3 pks daily   Vaping Use   Vaping Use: Never used  Substance and Sexual Activity   Alcohol use: Yes    Alcohol/week: 2.0 standard drinks    Types: 2 Cans of beer per week   Drug use: No   Sexual activity: Not Currently  Other Topics Concern   Not on file  Social History Narrative   Not on file   Social Determinants of Health   Financial Resource Strain: Low Risk    Difficulty of Paying Living Expenses: Not hard at all  Food Insecurity: No Food Insecurity   Worried About Charity fundraiser in the Last Year: Never true   [place] in the Last Year: Never true  Transportation Needs: No Transportation Needs   Lack of Transportation (Medical): No   Lack of Transportation (Non-Medical): No  Physical Activity: Inactive   Days of Exercise per Week: 0 days   Minutes of Exercise per Session: 0 min  Stress: Stress Concern Present   Feeling of Stress : To some extent  Social Connections: Not on file  Intimate Partner Violence: Not on file    Outpatient Medications Prior to Visit  Medication Sig Dispense Refill   apixaban (ELIQUIS) 5 MG TABS tablet Take 1 tablet (5 mg total) by mouth 2 (two) times daily. 60 tablet 3   Fluticasone-Umeclidin-Vilant (TRELEGY ELLIPTA) 100-62.5-25 MCG/INH AEPB Inhale 1 puff into the lungs daily. 1 each 11   albuterol (VENTOLIN HFA) 108 (90 Base) MCG/ACT inhaler Inhale 2 puffs into the lungs every 6 (six) hours as needed for wheezing or shortness of breath. 8.5 g 5   gabapentin (NEURONTIN) 100 MG capsule Take 1 capsule (100 mg total) by mouth at bedtime. FOR ANGRY NERVES TO CALM THEM DOWN (Patient not taking: Reported on 03/05/2021) 30 capsule 3   hydrOXYzine (VISTARIL)  25 MG capsule Take 1 capsule (25 mg total) by mouth every 8 (eight) hours as needed. (Patient not taking: Reported on 03/05/2021) 30 capsule 0   No facility-administered medications prior to visit.    Allergies  Allergen Reactions   Influenza Vaccines     High blood pressure- had to be admitted   Lipitor [Atorvastatin Calcium]     Muscle pain   Covid-19 (Adenovirus) Vaccine Rash    ROS Review of Systems  Constitutional:  Positive for fatigue.  HENT: Negative.    Respiratory:  Positive for shortness of breath (chronic).   Cardiovascular: Negative.   Gastrointestinal: Negative.   Genitourinary:  Incontinence at night  Neurological:  Positive for headaches (mild, intermittent). Negative for dizziness.     Objective:    Physical Exam Vitals and nursing note reviewed.  Constitutional:      General: He is not in acute distress.    Appearance: Normal appearance. He is not ill-appearing.  HENT:     Head: Normocephalic.  Eyes:     Conjunctiva/sclera: Conjunctivae normal.  Cardiovascular:     Rate and Rhythm: Normal rate and regular rhythm.     Pulses: Normal pulses.     Heart sounds: Normal heart sounds.  Pulmonary:     Effort: Pulmonary effort is normal.     Breath sounds: Normal breath sounds.  Musculoskeletal:     Cervical back: Normal range of motion.  Skin:    General: Skin is warm and dry.  Neurological:     General: No focal deficit present.     Mental Status: He is alert and oriented to person, place, and time.  Psychiatric:        Mood and Affect: Mood normal.        Behavior: Behavior normal.        Thought Content: Thought content normal.        Judgment: Judgment normal.    BP (!) 165/69   Pulse 62   Temp 97.9 F (36.6 C)   Wt 165 lb (74.8 kg)   SpO2 99%   BMI 26.63 kg/m  Wt Readings from Last 3 Encounters:  03/05/21 165 lb (74.8 kg)  03/03/21 155 lb (70.3 kg)  12/04/20 155 lb 6.4 oz (70.5 kg)     Health Maintenance Due  Topic Date  Due   COVID-19 Vaccine (3 - Booster for Pfizer series) 01/03/2020    There are no preventive care reminders to display for this patient.  Lab Results  Component Value Date   TSH 1.280 10/31/2020   Lab Results  Component Value Date   WBC 8.3 03/03/2021   HGB 16.9 03/03/2021   HCT 51.1 03/03/2021   MCV 97.1 03/03/2021   PLT 141 (L) 03/03/2021   Lab Results  Component Value Date   NA 140 03/03/2021   K 4.7 03/03/2021   CO2 25 03/03/2021   GLUCOSE 79 03/03/2021   BUN 14 03/03/2021   CREATININE 0.94 03/03/2021   BILITOT 0.5 10/31/2020   ALKPHOS 69 10/31/2020   AST 12 10/31/2020   ALT 11 10/31/2020   PROT 6.3 10/31/2020   ALBUMIN 4.0 10/31/2020   CALCIUM 9.4 03/03/2021   ANIONGAP 7 03/03/2021   EGFR 75 10/31/2020   Lab Results  Component Value Date   CHOL 159 08/02/2019   Lab Results  Component Value Date   HDL 43 08/02/2019   Lab Results  Component Value Date   LDLCALC 87 08/02/2019   Lab Results  Component Value Date   TRIG 165 (H) 08/02/2019   Lab Results  Component Value Date   CHOLHDL 3.7 08/02/2019   Lab Results  Component Value Date   HGBA1C 5.3 07/11/2020      Assessment & Plan:   Problem List Items Addressed This Visit       Cardiovascular and Mediastinum   Essential hypertension - Primary    Blood pressure not at goal, recheck was 165/69. Upon chart review, his blood pressure has been elevated for the past several visits. He was taking lisinopril in the past, however the patient does not remember this or why it was stopped. Will start lisinopril 66m  daily. Encouraged him to check his blood pressure at home. Recent labs reviewed from ER visit. Follow up with PCP in 2 weeks.       Relevant Medications   lisinopril (ZESTRIL) 5 MG tablet   Other Visit Diagnoses     Urinary incontinence, unspecified type       Ongoing at night since abdominal surgery. Will order incontinence supplies through insurance.    Relevant Orders   Incontinence  supply       Meds ordered this encounter  Medications   lisinopril (ZESTRIL) 5 MG tablet    Sig: Take 1 tablet (5 mg total) by mouth daily.    Dispense:  30 tablet    Refill:  0     Follow-up: Return in about 2 weeks (around 03/19/2021) for htn.    Charyl Dancer, NP

## 2021-03-05 NOTE — Assessment & Plan Note (Signed)
Blood pressure not at goal, recheck was 165/69. Upon chart review, his blood pressure has been elevated for the past several visits. He was taking lisinopril in the past, however the patient does not remember this or why it was stopped. Will start lisinopril 5mg  daily. Encouraged him to check his blood pressure at home. Recent labs reviewed from ER visit. Follow up with PCP in 2 weeks.

## 2021-03-15 ENCOUNTER — Telehealth: Payer: Self-pay | Admitting: Internal Medicine

## 2021-03-15 NOTE — Telephone Encounter (Signed)
Spoke with Brittney from [facility] Patient Assistance program and verified that patient is not admitted in the hospital, Claiborne states taht she will re submit the application for Eliquis

## 2021-03-15 NOTE — Telephone Encounter (Signed)
Sherrell with [employer] called from the mediation assistance for Eliquis and states that patient is admitted right. Or at least the form is checked in patient that she is needing to confirm that this is correct.  CB#  [phone]

## 2021-03-22 ENCOUNTER — Ambulatory Visit (INDEPENDENT_AMBULATORY_CARE_PROVIDER_SITE_OTHER): Payer: Medicare HMO | Admitting: Internal Medicine

## 2021-03-22 ENCOUNTER — Encounter: Payer: Self-pay | Admitting: Internal Medicine

## 2021-03-22 ENCOUNTER — Other Ambulatory Visit: Payer: Self-pay

## 2021-03-22 VITALS — BP 137/87 | HR 76 | Temp 97.8°F | Ht 66.14 in | Wt 164.4 lb

## 2021-03-22 DIAGNOSIS — J449 Chronic obstructive pulmonary disease, unspecified: Secondary | ICD-10-CM | POA: Diagnosis not present

## 2021-03-22 DIAGNOSIS — I4891 Unspecified atrial fibrillation: Secondary | ICD-10-CM | POA: Diagnosis not present

## 2021-03-22 DIAGNOSIS — L600 Ingrowing nail: Secondary | ICD-10-CM | POA: Diagnosis not present

## 2021-03-22 MED ORDER — LISINOPRIL 5 MG PO TABS: 5.0000 mg | ORAL_TABLET | Freq: Every day | ORAL | 0 refills | Status: DC

## 2021-03-22 MED ORDER — LISINOPRIL 5 MG PO TABS: 5.0000 mg | ORAL_TABLET | Freq: Every day | ORAL | 9 refills | Status: AC

## 2021-03-22 MED ORDER — APIXABAN 5 MG PO TABS: 5.0000 mg | ORAL_TABLET | Freq: Two times a day (BID) | ORAL | 3 refills | Status: AC

## 2021-03-22 NOTE — Progress Notes (Signed)
BP 137/87    Pulse 76    Temp 97.8 F (36.6 C) (Oral)    Ht 5' 6.14" (1.68 m)    Wt 164 lb 6.4 oz (74.6 kg)    SpO2 98%    BMI 26.42 kg/m    Subjective:    Patient ID: Brady Smith, male    DOB: 27-Feb-1946, 75 y.o.   MRN: [id number]  Chief Complaint  Patient presents with   Hypertension   Foot pain    B/L foot pain, patient states that he has ingrown toe nails and the are the worst he ever had     HPI: Brady Smith is a 75 y.o. male  Has had surgery 27 year ago " took smal and large intestines and had kidney - bladder made out of his intestines " in [place]   Has ingrown toe nails.   Hypertension This is a chronic problem. The current episode started more than 1 year ago. The problem is unchanged. The problem is controlled. Pertinent negatives include no anxiety, blurred vision, chest pain, headaches, malaise/fatigue, neck pain, orthopnea, palpitations, peripheral edema, PND, shortness of breath or sweats.  Heart Problem This is a chronic problem. The current episode started more than 1 month ago. Pertinent negatives include no abdominal pain, arthralgias, chest pain, chills, congestion, coughing, fatigue, fever, headaches, joint swelling, myalgias, nausea, neck pain, numbness, rash, vomiting or weakness.   Chief Complaint  Patient presents with   Hypertension   Foot pain    B/L foot pain, patient states that he has ingrown toe nails and the are the worst he ever had     Relevant past medical, surgical, family and social history reviewed and updated as indicated. Interim medical history since our last visit reviewed. Allergies and medications reviewed and updated.  Review of Systems  Constitutional:  Negative for activity change, appetite change, chills, fatigue, fever and malaise/fatigue.  HENT:  Negative for congestion, ear discharge, ear pain and facial swelling.   Eyes:  Negative for blurred vision, pain, discharge and itching.  Respiratory:  Negative for  cough, chest tightness, shortness of breath and wheezing.   Cardiovascular:  Negative for chest pain, palpitations, orthopnea, leg swelling and PND.  Gastrointestinal:  Negative for abdominal distention, abdominal pain, blood in stool, constipation, diarrhea, nausea and vomiting.  Endocrine: Negative for cold intolerance, heat intolerance, polydipsia, polyphagia and polyuria.  Genitourinary:  Negative for difficulty urinating, dysuria, flank pain, frequency, hematuria and urgency.  Musculoskeletal:  Negative for arthralgias, gait problem, joint swelling, myalgias and neck pain.  Skin:  Negative for color change, rash and wound.  Neurological:  Negative for dizziness, tremors, speech difficulty, weakness, light-headedness, numbness and headaches.  Hematological:  Does not bruise/bleed easily.  Psychiatric/Behavioral:  Negative for agitation, confusion, decreased concentration, sleep disturbance and suicidal ideas.    Per HPI unless specifically indicated above     Objective:    BP 137/87    Pulse 76    Temp 97.8 F (36.6 C) (Oral)    Ht 5' 6.14" (1.68 m)    Wt 164 lb 6.4 oz (74.6 kg)    SpO2 98%    BMI 26.42 kg/m   Wt Readings from Last 3 Encounters:  03/22/21 164 lb 6.4 oz (74.6 kg)  03/05/21 165 lb (74.8 kg)  03/03/21 155 lb (70.3 kg)    Physical Exam Vitals and nursing note reviewed.  Constitutional:      General: He is not in acute distress.  Appearance: Normal appearance. He is not ill-appearing or diaphoretic.  HENT:     Head: Normocephalic and atraumatic.     Right Ear: Tympanic membrane and external ear normal. There is no impacted cerumen.     Left Ear: External ear normal.     Nose: No congestion or rhinorrhea.     Mouth/Throat:     Pharynx: No oropharyngeal exudate or posterior oropharyngeal erythema.  Eyes:     Conjunctiva/sclera: Conjunctivae normal.     Pupils: Pupils are equal, round, and reactive to light.  Cardiovascular:     Rate and Rhythm: Normal rate and  regular rhythm.     Heart sounds: No murmur heard.   No friction rub. No gallop.  Pulmonary:     Effort: No respiratory distress.     Breath sounds: No stridor. No wheezing or rhonchi.  Chest:     Chest wall: No tenderness.  Abdominal:     General: There is distension.     Palpations: Abdomen is soft. There is mass.     Tenderness: There is no abdominal tenderness. There is no right CVA tenderness, left CVA tenderness, guarding or rebound.     Hernia: No hernia is present.     Comments: Abdominal distention and abnormal  masses noted  Musculoskeletal:     Cervical back: Normal range of motion and neck supple. No rigidity or tenderness.     Left lower leg: No edema.  Skin:    General: Skin is warm and dry.  Neurological:     Mental Status: He is alert.    Results for orders placed or performed during the hospital encounter of 88/28/00  Basic metabolic panel  Result Value Ref Range   Sodium 140 135 - 145 mmol/L   Potassium 4.7 3.5 - 5.1 mmol/L   Chloride 108 98 - 111 mmol/L   CO2 25 22 - 32 mmol/L   Glucose, Bld 79 70 - 99 mg/dL   BUN 14 8 - 23 mg/dL   Creatinine, Ser 0.94 0.61 - 1.24 mg/dL   Calcium 9.4 8.9 - 10.3 mg/dL   GFR, Estimated >60 >60 mL/min   Anion gap 7 5 - 15  CBC  Result Value Ref Range   WBC 8.3 4.0 - 10.5 K/uL   RBC 5.26 4.22 - 5.81 MIL/uL   Hemoglobin 16.9 13.0 - 17.0 g/dL   HCT 51.1 39.0 - 52.0 %   MCV 97.1 80.0 - 100.0 fL   MCH 32.1 26.0 - 34.0 pg   MCHC 33.1 30.0 - 36.0 g/dL   RDW 13.5 11.5 - 15.5 %   Platelets 141 (L) 150 - 400 K/uL   nRBC 0.0 0.0 - 0.2 %  Protime-INR (order if Patient is taking Coumadin / Warfarin)  Result Value Ref Range   Prothrombin Time 12.5 11.4 - 15.2 seconds   INR 0.9 0.8 - 1.2  Troponin I (High Sensitivity)  Result Value Ref Range   Troponin I (High Sensitivity) 5 <18 ng/L        Current Outpatient Medications:    Fluticasone-Umeclidin-Vilant (TRELEGY ELLIPTA) 100-62.5-25 MCG/INH AEPB, Inhale 1 puff into the  lungs daily., Disp: 1 each, Rfl: 11   apixaban (ELIQUIS) 5 MG TABS tablet, Take 1 tablet (5 mg total) by mouth 2 (two) times daily., Disp: 60 tablet, Rfl: 3   gabapentin (NEURONTIN) 100 MG capsule, Take 1 capsule (100 mg total) by mouth at bedtime. FOR ANGRY NERVES TO CALM THEM DOWN (Patient not taking: Reported on 03/05/2021),  Disp: 30 capsule, Rfl: 3   hydrOXYzine (VISTARIL) 25 MG capsule, Take 1 capsule (25 mg total) by mouth every 8 (eight) hours as needed. (Patient not taking: Reported on 03/05/2021), Disp: 30 capsule, Rfl: 0   lisinopril (ZESTRIL) 5 MG tablet, Take 1 tablet (5 mg total) by mouth daily., Disp: 30 tablet, Rfl: 0    Assessment & Plan:  HTN is on liisnopril 5 mg daily will resend Continue current meds.  Medication compliance emphasised. pt advised to keep Bp logs. Pt verbalised understanding of the same. Pt to have a low salt diet . Exercise to reach a goal of at least 150 mins a week.  lifestyle modifications explained and pt understands importance of the above.  2. Afib on eliquis 5 mg  Stable, chronic  Doesn't want to see cardiology for such   3. Abdominal mass / protrusions noted on abdominal wall Pt declines referrals and doesn't want to see anyone for such    4. Ingrown toe nails will refer to podiatry for such  Problem List Items Addressed This Visit   None    No orders of the defined types were placed in this encounter.    Meds ordered this encounter  Medications   apixaban (ELIQUIS) 5 MG TABS tablet    Sig: Take 1 tablet (5 mg total) by mouth 2 (two) times daily.    Dispense:  60 tablet    Refill:  3   lisinopril (ZESTRIL) 5 MG tablet    Sig: Take 1 tablet (5 mg total) by mouth daily.    Dispense:  30 tablet    Refill:  0     Follow up plan: No follow-ups on file.

## 2021-03-27 ENCOUNTER — Ambulatory Visit: Payer: Medicare HMO | Admitting: Podiatry

## 2021-03-27 ENCOUNTER — Other Ambulatory Visit: Payer: Self-pay

## 2021-03-27 ENCOUNTER — Encounter: Payer: Self-pay | Admitting: Podiatry

## 2021-03-27 DIAGNOSIS — M79675 Pain in left toe(s): Secondary | ICD-10-CM | POA: Diagnosis not present

## 2021-03-27 DIAGNOSIS — B351 Tinea unguium: Secondary | ICD-10-CM | POA: Diagnosis not present

## 2021-03-27 DIAGNOSIS — G6289 Other specified polyneuropathies: Secondary | ICD-10-CM

## 2021-03-27 DIAGNOSIS — M79674 Pain in right toe(s): Secondary | ICD-10-CM | POA: Diagnosis not present

## 2021-03-27 NOTE — Progress Notes (Signed)
°  Subjective:  Patient ID: MONTERRIO GERST, male    DOB: 1945/10/10,  MRN: [id number]  Chief Complaint  Patient presents with   Nail Problem    Nail trim    76 y.o. male returns for the above complaint.  Patient presents with thickened elongated dystrophic toenails x10.  Patient states.  To touch.  He is a smoker.  He is not a diabetic.  He would like to have them debrided out is not able to do it himself.  Objective:  There were no vitals filed for this visit. Podiatric Exam: Vascular: dorsalis pedis and posterior tibial pulses are palpable bilateral. Capillary return is immediate. Temperature gradient is WNL. Skin turgor WNL  Sensorium: Normal Semmes Weinstein monofilament test. Normal tactile sensation bilaterally. Nail Exam: Pt has thick disfigured discolored nails with subungual debris noted bilateral entire nail hallux through fifth toenails except for right fifth digit status post amputation.  Pain on palpation to the nails. Ulcer Exam: There is no evidence of ulcer or pre-ulcerative changes or infection. Orthopedic Exam: Muscle tone and strength are WNL. No limitations in general ROM. No crepitus or effusions noted.  Right fifth digit amputation Skin: No Porokeratosis. No infection or ulcers    Assessment & Plan:   1. Pain due to onychomycosis of toenails of both feet   2. Other polyneuropathy     Patient was evaluated and treated and all questions answered.  Onychomycosis with pain  -Nails palliatively debrided as below. -Educated on self-care  Procedure: Nail Debridement Rationale: pain  Type of Debridement: manual, sharp debridement. Instrumentation: Nail nipper, rotary burr. Number of Nails: 9  Procedures and Treatment: Consent by patient was obtained for treatment procedures. The patient understood the discussion of treatment and procedures well. All questions were answered thoroughly reviewed. Debridement of mycotic and hypertrophic toenails, 1 through 5 bilateral  and clearing of subungual debris. No ulceration, no infection noted.  Return Visit-Office Procedure: Patient instructed to return to the office for a follow up visit 3 months for continued evaluation and treatment.  Boneta Lucks, DPM    Return in about 6 months (around 09/24/2021).

## 2021-03-28 DIAGNOSIS — J441 Chronic obstructive pulmonary disease with (acute) exacerbation: Secondary | ICD-10-CM | POA: Diagnosis not present

## 2021-04-09 ENCOUNTER — Telehealth: Payer: Medicare HMO

## 2021-04-09 ENCOUNTER — Telehealth: Payer: Self-pay

## 2021-04-09 NOTE — Telephone Encounter (Signed)
°  Care Management   Follow Up Note   04/09/2021 Name: Brady Smith MRN: [id number] DOB: 10-19-1945   Referred by: Charlynne Cousins, MD Reason for referral : No chief complaint on file.   An unsuccessful telephone outreach was attempted today. The patient was referred to the case management team for assistance with care management and care coordination.   Follow Up Plan:  Care guide notified to Ida, PharmD, [facility] Clinical Pharmacist  [facility]  [phone]

## 2021-04-10 ENCOUNTER — Encounter: Payer: Self-pay | Admitting: Internal Medicine

## 2021-04-12 ENCOUNTER — Ambulatory Visit: Payer: Medicare HMO | Admitting: Internal Medicine

## 2021-04-28 DIAGNOSIS — J441 Chronic obstructive pulmonary disease with (acute) exacerbation: Secondary | ICD-10-CM | POA: Diagnosis not present

## 2021-05-03 ENCOUNTER — Telehealth: Payer: Self-pay

## 2021-05-03 NOTE — Chronic Care Management (AMB) (Unsigned)
°  Care Management   Note  05/03/2021 Name: Brady Smith MRN: [id number] DOB: [birth year]  [name] Beller is a 76 y.o. year old male who is a primary care patient of Vigg, Avanti, MD and is actively engaged with the care management team. I reached out to [name] by phone today to assist with re-scheduling a follow up visit with the Pharmacist  Follow up plan: Unsuccessful telephone outreach attempt made. A HIPAA compliant phone message was left for the patient providing contact information and requesting a return call.  The care management team will reach out to the patient again over the next 7 days.  If patient returns call to provider office, please advise to call [facility]  at [place] [address], [place], [place], [place] [address] Direct Dial: [phone] [email or web address] Website: [email or web address]

## 2021-05-22 NOTE — Chronic Care Management (AMB) (Unsigned)
°  Care Management   Note  05/22/2021 Name: ACETON KINNEAR MRN: [id number] DOB: 1945-11-24  [name] Gladden is a 76 y.o. year old male who is a primary care patient of Vigg, Avanti, MD and is actively engaged with the care management team. I reached out to [name] by phone today to assist with re-scheduling a follow up visit with the Pharmacist  Follow up plan: Unsuccessful telephone outreach attempt made. A HIPAA compliant phone message was left for the patient providing contact information and requesting a return call.  The care management team will reach out to the patient again over the next 7 days.  If patient returns call to provider office, please advise to call [facility]  at [address], [address], [place], [place] [address] Direct Dial: [phone] [email or web address] Website: [email or web address]

## 2021-05-26 DIAGNOSIS — J441 Chronic obstructive pulmonary disease with (acute) exacerbation: Secondary | ICD-10-CM | POA: Diagnosis not present

## 2021-05-28 ENCOUNTER — Encounter: Payer: Self-pay | Admitting: Internal Medicine

## 2021-05-28 ENCOUNTER — Other Ambulatory Visit: Payer: Self-pay

## 2021-05-28 ENCOUNTER — Ambulatory Visit (INDEPENDENT_AMBULATORY_CARE_PROVIDER_SITE_OTHER): Payer: Medicare PPO | Admitting: Internal Medicine

## 2021-05-28 VITALS — BP 132/78 | HR 92 | Temp 97.4°F | Ht 66.14 in | Wt 166.8 lb

## 2021-05-28 DIAGNOSIS — R350 Frequency of micturition: Secondary | ICD-10-CM | POA: Diagnosis not present

## 2021-05-28 DIAGNOSIS — R202 Paresthesia of skin: Secondary | ICD-10-CM | POA: Diagnosis not present

## 2021-05-28 DIAGNOSIS — R200 Anesthesia of skin: Secondary | ICD-10-CM | POA: Diagnosis not present

## 2021-05-28 DIAGNOSIS — K0889 Other specified disorders of teeth and supporting structures: Secondary | ICD-10-CM

## 2021-05-28 LAB — URINALYSIS, ROUTINE W REFLEX MICROSCOPIC
Bilirubin, UA: NEGATIVE
Glucose, UA: NEGATIVE
Ketones, UA: NEGATIVE
Leukocytes,UA: NEGATIVE
Nitrite, UA: NEGATIVE
Protein,UA: NEGATIVE
RBC, UA: NEGATIVE
Specific Gravity, UA: 1.02 (ref 1.005–1.030)
Urobilinogen, Ur: 0.2 mg/dL (ref 0.2–1.0)
pH, UA: 7 (ref 5.0–7.5)

## 2021-05-28 LAB — BAYER DCA HB A1C WAIVED: HB A1C (BAYER DCA - WAIVED): 5.5 % (ref 4.8–5.6)

## 2021-05-28 MED ORDER — MELOXICAM 7.5 MG PO TABS: 7.5000 mg | ORAL_TABLET | Freq: Every day | ORAL | 0 refills | Status: DC

## 2021-05-28 MED ORDER — AMOXICILLIN 500 MG PO CAPS: 500.0000 mg | ORAL_CAPSULE | Freq: Two times a day (BID) | ORAL | 0 refills | Status: AC

## 2021-05-28 NOTE — Progress Notes (Signed)
? ?BP 132/78   Pulse 92   Temp (!) 97.4 ?F (36.3 ?C) (Oral)   Ht 5' 6.14" (1.68 m)   Wt 166 lb 12.8 oz (75.7 kg)   SpO2 96%   BMI 26.81 kg/m?   ? ?Subjective:  ? ? Patient ID: Brady Smith, male    DOB: 12-23-1945, 76 y.o.   MRN: [id number] ? ?Chief Complaint  ?Patient presents with  ? Hand Pain  ? Dental Pain  ? ? ?HPI: ?Brady Smith is a 76 y.o. male ? ?Hand Pain  ?The incident occurred more than 1 week ago (intemrittent per pt has numbness in bil hands). The pain radiates to the right hand and left hand. The pain is at a severity of 4/10. The pain has been Intermittent since the incident. Associated symptoms include numbness and tingling.  ?Dental Pain  ?This is a recurrent problem. The current episode started 1 to 4 weeks ago.  ?Urinary Frequency  ?Chronicity: has had nocturia 3-5 times. Associated symptoms include frequency.  ? ?Chief Complaint  ?Patient presents with  ? Hand Pain  ? Dental Pain  ? ?Past Medical History:  ?Diagnosis Date  ? Acute on chronic respiratory failure with hypoxia ([place]) 12/23/2019  ? Acute respiratory failure with hypoxia ([place]) 12/23/2019  ? Anxiety   ? Bladder cancer ([place])   ? CAP (community acquired pneumonia) 12/04/2019  ? COPD (chronic obstructive pulmonary disease) ([place])   ? COPD exacerbation ([place]) 06/11/2011  ? Overview:  Overview:  Followed in Pulmonary clinic/ [facility]/ [place]    - HFA < 25% 06/10/11  Last Assessment & Plan:  Extremely poor hfa technique gives me hope that he really isn't that bad and needs to focus his attention on smoking cessation  I took an extended  opportunity with this patient to outline the consequences of continued cigarette use  in airway disorders based on all the dat  ? Depression   ? GERD (gastroesophageal reflux disease)   ? History of kidney cancer   ? Peripheral vascular disease ([place])   ? Pneumonia   ? ? ?Relevant past medical, surgical, family and social history reviewed and updated as indicated. Interim medical history since our last  visit reviewed. ?Allergies and medications reviewed and updated. ? ?Review of Systems  ?Genitourinary:  Positive for frequency.  ?Neurological:  Positive for tingling and numbness.  ? ?Per HPI unless specifically indicated above ? ?   ?Objective:  ?  ?BP 132/78   Pulse 92   Temp (!) 97.4 ?F (36.3 ?C) (Oral)   Ht 5' 6.14" (1.68 m)   Wt 166 lb 12.8 oz (75.7 kg)   SpO2 96%   BMI 26.81 kg/m?   ?Wt Readings from Last 3 Encounters:  ?05/28/21 166 lb 12.8 oz (75.7 kg)  ?03/22/21 164 lb 6.4 oz (74.6 kg)  ?03/05/21 165 lb (74.8 kg)  ?  ?Physical Exam ?Vitals and nursing note reviewed.  ?Constitutional:   ?   General: He is not in acute distress. ?   Appearance: Normal appearance. He is not ill-appearing or diaphoretic.  ?Cardiovascular:  ?   Rate and Rhythm: Normal rate and regular rhythm.  ?   Heart sounds:  ?  No gallop.  ?Pulmonary:  ?   Effort: No respiratory distress.  ?   Breath sounds: No stridor. No wheezing or rhonchi.  ?Chest:  ?   Chest wall: No tenderness.  ?Abdominal:  ?   General: There is distension.  ?   Palpations:  There is no mass.  ?   Tenderness: There is no abdominal tenderness. There is no guarding.  ?   Hernia: A hernia is present.  ?   Comments: Has an abdominal hernia and a protruding abdominal mass  ?Musculoskeletal:  ?   Cervical back: Normal range of motion and neck supple. No rigidity or tenderness.  ?   Left lower leg: No edema.  ?Skin: ?   General: Skin is warm and dry.  ?Neurological:  ?   Mental Status: He is alert.  ? ? ?Results for orders placed or performed during the hospital encounter of 03/03/21  ?Basic metabolic panel  ?Result Value Ref Range  ? Sodium 140 135 - 145 mmol/L  ? Potassium 4.7 3.5 - 5.1 mmol/L  ? Chloride 108 98 - 111 mmol/L  ? CO2 25 22 - 32 mmol/L  ? Glucose, Bld 79 70 - 99 mg/dL  ? BUN 14 8 - 23 mg/dL  ? Creatinine, Ser 0.94 0.61 - 1.24 mg/dL  ? Calcium 9.4 8.9 - 10.3 mg/dL  ? GFR, Estimated >60 >60 mL/min  ? Anion gap 7 5 - 15  ?CBC  ?Result Value Ref Range  ?  WBC 8.3 4.0 - 10.5 K/uL  ? RBC 5.26 4.22 - 5.81 MIL/uL  ? Hemoglobin 16.9 13.0 - 17.0 g/dL  ? HCT 51.1 39.0 - 52.0 %  ? MCV 97.1 80.0 - 100.0 fL  ? MCH 32.1 26.0 - 34.0 pg  ? MCHC 33.1 30.0 - 36.0 g/dL  ? RDW 13.5 11.5 - 15.5 %  ? Platelets 141 (L) 150 - 400 K/uL  ? nRBC 0.0 0.0 - 0.2 %  ?Protime-INR (order if Patient is taking Coumadin / Warfarin)  ?Result Value Ref Range  ? Prothrombin Time 12.5 11.4 - 15.2 seconds  ? INR 0.9 0.8 - 1.2  ?Troponin I (High Sensitivity)  ?Result Value Ref Range  ? Troponin I (High Sensitivity) 5 <18 ng/L  ? ?   ? ? ?Current Outpatient Medications:  ?  apixaban (ELIQUIS) 5 MG TABS tablet, Take 1 tablet (5 mg total) by mouth 2 (two) times daily., Disp: 60 tablet, Rfl: 3 ?  Fluticasone-Umeclidin-Vilant (TRELEGY ELLIPTA) 100-62.5-25 MCG/INH AEPB, Inhale 1 puff into the lungs daily., Disp: 1 each, Rfl: 11 ?  lisinopril (ZESTRIL) 5 MG tablet, Take 1 tablet (5 mg total) by mouth daily., Disp: 30 tablet, Rfl: 9 ?  meloxicam (MOBIC) 7.5 MG tablet, Take 1 tablet (7.5 mg total) by mouth daily for 7 days., Disp: 7 tablet, Rfl: 0  ? ? ?Assessment & Plan:  ? ?Tingling numbness in bil hands /Hand pain :  ?Will start pt on meloxicam for such  ?Will check a1c/ b12/ tsh  ? ? ?Urinary freq with nocturia says he had a bladder made from his intestines for a ho bladder cancer  ?Lost to fu  ?Pt very non compliant with follow ups and hasnt been seen in a while  ?No labs done since last year ?Doesn't want this evaluated ? ?Protruding abdominal mass Arlice Colt ?Will need to fu with gen surg ?Declines any referrals ? ?Tooth ache ?Start mobic for scuh  ?Doesn't have a dentist will need to get an appt to see one soon. ?Unsure if pt will make an appt or follow up  ?Will rx with amoxicillin for potential toothi nfection ? ? ? ? ?Problem List Items Addressed This Visit   ? ?  ? Other  ? Tooth ache - Primary  ?  ? ?  No orders of the defined types were placed in this encounter. ?  ? ?Meds ordered this encounter   ?Medications  ? meloxicam (MOBIC) 7.5 MG tablet  ?  Sig: Take 1 tablet (7.5 mg total) by mouth daily for 7 days.  ?  Dispense:  7 tablet  ?  Refill:  0  ?  ? ?Follow up plan: ?No follow-ups on file. ? ? ? ?

## 2021-05-29 DIAGNOSIS — R202 Paresthesia of skin: Secondary | ICD-10-CM | POA: Insufficient documentation

## 2021-05-29 DIAGNOSIS — R200 Anesthesia of skin: Secondary | ICD-10-CM | POA: Insufficient documentation

## 2021-05-29 DIAGNOSIS — R350 Frequency of micturition: Secondary | ICD-10-CM | POA: Insufficient documentation

## 2021-05-29 LAB — COMPREHENSIVE METABOLIC PANEL
ALT: 15 IU/L (ref 0–44)
AST: 13 IU/L (ref 0–40)
Albumin/Globulin Ratio: 2.2 (ref 1.2–2.2)
Albumin: 4.2 g/dL (ref 3.7–4.7)
Alkaline Phosphatase: 75 IU/L (ref 44–121)
BUN/Creatinine Ratio: 15 (ref 10–24)
BUN: 18 mg/dL (ref 8–27)
Bilirubin Total: 0.4 mg/dL (ref 0.0–1.2)
CO2: 22 mmol/L (ref 20–29)
Calcium: 9.3 mg/dL (ref 8.6–10.2)
Chloride: 107 mmol/L — ABNORMAL HIGH (ref 96–106)
Creatinine, Ser: 1.23 mg/dL (ref 0.76–1.27)
Globulin, Total: 1.9 g/dL (ref 1.5–4.5)
Glucose: 102 mg/dL — ABNORMAL HIGH (ref 70–99)
Potassium: 4.6 mmol/L (ref 3.5–5.2)
Sodium: 146 mmol/L — ABNORMAL HIGH (ref 134–144)
Total Protein: 6.1 g/dL (ref 6.0–8.5)
eGFR: 61 mL/min/{1.73_m2} (ref >59)

## 2021-05-29 LAB — TSH: TSH: 1.13 u[IU]/mL (ref 0.450–4.500)

## 2021-05-29 LAB — VITAMIN B12: Vitamin B-12: 202 pg/mL — ABNORMAL LOW (ref 232–1245)

## 2021-05-29 NOTE — Progress Notes (Signed)
Pl let him know his b12 levels are very low and ? Causing his tingling and numbness in his hands, needs shots q monthly, plase pursuade him to come and get them. Thnx

## 2021-06-01 ENCOUNTER — Ambulatory Visit (INDEPENDENT_AMBULATORY_CARE_PROVIDER_SITE_OTHER): Payer: Medicare PPO

## 2021-06-01 ENCOUNTER — Other Ambulatory Visit: Payer: Self-pay

## 2021-06-01 DIAGNOSIS — E538 Deficiency of other specified B group vitamins: Secondary | ICD-10-CM

## 2021-06-01 MED ORDER — CYANOCOBALAMIN 1000 MCG/ML IJ SOLN
1000.0000 ug | Freq: Once | INTRAMUSCULAR | Status: AC
  Administered 2021-06-01: 1000 ug via INTRAMUSCULAR

## 2021-06-01 MED ORDER — MELOXICAM 7.5 MG PO TABS: 7.5000 mg | ORAL_TABLET | Freq: Every day | ORAL | 0 refills | Status: AC

## 2021-06-01 NOTE — Progress Notes (Signed)
Patient present today for his B-12, received in Left Deltoid, patient tolerated well. ?

## 2021-06-07 NOTE — Chronic Care Management (AMB) (Signed)
?  Care Management  ? ?Note ? ?06/07/2021 ?Name: Brady Smith MRN: [id number] DOB: 1945/07/11 ? ?Brady Smith is a 76 y.o. year old male who is a primary care patient of Vigg, Avanti, MD and is actively engaged with the care management team. I reached out to [name] by phone today to assist with re-scheduling a follow up visit with the Pharmacist ? ?Follow up plan: ?Unable to make contact on outreach attempts x 3. PCP Vigg, Avanti, MD notified via routed documentation in medical record.  ? ?Noreene Larsson, RMA ?Care Guide, Embedded Care Coordination ?[facility]  ?[place], [place] [address] ?Direct Dial: [phone] ?[email or web address] ?Website: [email or web address]  ? ?

## 2021-06-26 DIAGNOSIS — J441 Chronic obstructive pulmonary disease with (acute) exacerbation: Secondary | ICD-10-CM | POA: Diagnosis not present

## 2021-06-27 ENCOUNTER — Other Ambulatory Visit: Payer: Self-pay | Admitting: Internal Medicine

## 2021-06-27 ENCOUNTER — Ambulatory Visit (INDEPENDENT_AMBULATORY_CARE_PROVIDER_SITE_OTHER): Payer: Medicare PPO | Admitting: Internal Medicine

## 2021-06-27 ENCOUNTER — Encounter: Payer: Self-pay | Admitting: Internal Medicine

## 2021-06-27 VITALS — BP 138/87 | HR 70 | Temp 97.7°F | Ht 66.14 in | Wt 164.0 lb

## 2021-06-27 DIAGNOSIS — R413 Other amnesia: Secondary | ICD-10-CM

## 2021-06-27 DIAGNOSIS — E538 Deficiency of other specified B group vitamins: Secondary | ICD-10-CM

## 2021-06-27 DIAGNOSIS — R1907 Generalized intra-abdominal and pelvic swelling, mass and lump: Secondary | ICD-10-CM

## 2021-06-27 MED ORDER — SPIRIVA RESPIMAT 2.5 MCG/ACT IN AERS: 2.0000 | INHALATION_SPRAY | Freq: Every day | RESPIRATORY_TRACT | 5 refills | Status: AC

## 2021-06-27 MED ORDER — CYANOCOBALAMIN 1000 MCG/ML IJ SOLN
1000.0000 ug | Freq: Once | INTRAMUSCULAR | Status: AC
  Administered 2021-06-27: 1000 ug via INTRAMUSCULAR

## 2021-06-27 MED ORDER — MELOXICAM 7.5 MG PO TABS: 7.5000 mg | ORAL_TABLET | Freq: Every day | ORAL | 0 refills | Status: AC

## 2021-06-27 MED ORDER — TRELEGY ELLIPTA 100-62.5-25 MCG/ACT IN AEPB: 1.0000 | INHALATION_SPRAY | Freq: Every day | RESPIRATORY_TRACT | 11 refills | Status: DC

## 2021-06-27 NOTE — Progress Notes (Signed)
? ?BP 138/87   Pulse 70   Temp 97.7 ?F (36.5 ?C) (Oral)   Ht 5' 6.14" (1.68 m)   Wt 164 lb (74.4 kg)   SpO2 100%   BMI 26.36 kg/m?   ? ?Subjective:  ? ? Patient ID: Brady Smith, male    DOB: Oct 27, 1945, 76 y.o.   MRN: [id number] ? ?Chief Complaint  ?Patient presents with  ? Leg Pain  ?  Right leg pain  ? ? ?HPI: ?Brady Smith is a 76 y.o. male ? ?Leg Pain  ?The incident occurred more than 1 week ago (off and on for the last 6 months - 1 yr.). The quality of the pain is described as aching. The pain is at a severity of 7/10. The pain is mild. The pain has been Fluctuating since onset. Pertinent negatives include no inability to bear weight, loss of motion, loss of sensation, muscle weakness, numbness or tingling. Associated symptoms comments: Can hardly walk but a few feet. .  ? ?Chief Complaint  ?Patient presents with  ? Leg Pain  ?  Right leg pain  ? ? ?Relevant past medical, surgical, family and social history reviewed and updated as indicated. Interim medical history since our last visit reviewed. ?Allergies and medications reviewed and updated. ? ?Review of Systems  ?Neurological:  Negative for tingling and numbness.  ? ?Per HPI unless specifically indicated above ? ?   ?Objective:  ?  ?BP 138/87   Pulse 70   Temp 97.7 ?F (36.5 ?C) (Oral)   Ht 5' 6.14" (1.68 m)   Wt 164 lb (74.4 kg)   SpO2 100%   BMI 26.36 kg/m?   ?Wt Readings from Last 3 Encounters:  ?06/27/21 164 lb (74.4 kg)  ?05/28/21 166 lb 12.8 oz (75.7 kg)  ?03/22/21 164 lb 6.4 oz (74.6 kg)  ?  ?Physical Exam ?Vitals and nursing note reviewed.  ?Constitutional:   ?   General: He is not in acute distress. ?   Appearance: Normal appearance. He is not ill-appearing or diaphoretic.  ?HENT:  ?   Head: Normocephalic and atraumatic.  ?   Right Ear: Tympanic membrane and external ear normal. There is no impacted cerumen.  ?   Left Ear: External ear normal.  ?   Nose: No congestion or rhinorrhea.  ?   Mouth/Throat:  ?   Pharynx: No oropharyngeal  exudate or posterior oropharyngeal erythema.  ?Eyes:  ?   Conjunctiva/sclera: Conjunctivae normal.  ?   Pupils: Pupils are equal, round, and reactive to light.  ?Cardiovascular:  ?   Rate and Rhythm: Normal rate and regular rhythm.  ?   Heart sounds: No murmur heard. ?  No friction rub. No gallop.  ?Pulmonary:  ?   Effort: No respiratory distress.  ?   Breath sounds: No stridor. No wheezing or rhonchi.  ?Chest:  ?   Chest wall: No tenderness.  ?Abdominal:  ?   General: Bowel sounds are normal.  ?   Palpations: Abdomen is soft. There is mass.  ?   Tenderness: There is no abdominal tenderness.  ?   Comments: Protruding abdminal mass noted on abdominal exam   ?Musculoskeletal:     ?   General: No swelling, tenderness, deformity or signs of injury.  ?   Cervical back: Normal range of motion and neck supple. No rigidity or tenderness.  ?   Right lower leg: No edema.  ?   Left lower leg: No edema.  ?Skin: ?  General: Skin is warm and dry.  ?Neurological:  ?   Mental Status: He is alert.  ? ? ?Results for orders placed or performed in visit on 05/28/21  ?Bayer DCA Hb A1c Waived (STAT)  ?Result Value Ref Range  ? HB A1C (BAYER DCA - WAIVED) 5.5 4.8 - 5.6 %  ?TSH  ?Result Value Ref Range  ? TSH 1.130 0.450 - 4.500 uIU/mL  ?B12  ?Result Value Ref Range  ? Vitamin B-12 202 (L) 232 - 1,245 pg/mL  ?Comprehensive metabolic panel  ?Result Value Ref Range  ? Glucose 102 (H) 70 - 99 mg/dL  ? BUN 18 8 - 27 mg/dL  ? Creatinine, Ser 1.23 0.76 - 1.27 mg/dL  ? eGFR 61 >59 mL/min/1.73  ? BUN/Creatinine Ratio 15 10 - 24  ? Sodium 146 (H) 134 - 144 mmol/L  ? Potassium 4.6 3.5 - 5.2 mmol/L  ? Chloride 107 (H) 96 - 106 mmol/L  ? CO2 22 20 - 29 mmol/L  ? Calcium 9.3 8.6 - 10.2 mg/dL  ? Total Protein 6.1 6.0 - 8.5 g/dL  ? Albumin 4.2 3.7 - 4.7 g/dL  ? Globulin, Total 1.9 1.5 - 4.5 g/dL  ? Albumin/Globulin Ratio 2.2 1.2 - 2.2  ? Bilirubin Total 0.4 0.0 - 1.2 mg/dL  ? Alkaline Phosphatase 75 44 - 121 IU/L  ? AST 13 0 - 40 IU/L  ? ALT 15 0 - 44  IU/L  ?Urinalysis, Routine w reflex microscopic  ?Result Value Ref Range  ? Specific Gravity, UA 1.020 1.005 - 1.030  ? pH, UA 7.0 5.0 - 7.5  ? Color, UA Yellow Yellow  ? Appearance Ur Clear Clear  ? Leukocytes,UA Negative Negative  ? Protein,UA Negative Negative/Trace  ? Glucose, UA Negative Negative  ? Ketones, UA Negative Negative  ? RBC, UA Negative Negative  ? Bilirubin, UA Negative Negative  ? Urobilinogen, Ur 0.2 0.2 - 1.0 mg/dL  ? Nitrite, UA Negative Negative  ? ?   ? ? ?Current Outpatient Medications:  ?  albuterol (VENTOLIN HFA) 108 (90 Base) MCG/ACT inhaler, Inhale into the lungs., Disp: , Rfl:  ?  apixaban (ELIQUIS) 5 MG TABS tablet, Take 1 tablet (5 mg total) by mouth 2 (two) times daily., Disp: 60 tablet, Rfl: 3 ?  lisinopril (ZESTRIL) 5 MG tablet, Take 1 tablet (5 mg total) by mouth daily., Disp: 30 tablet, Rfl: 9 ?  meloxicam (MOBIC) 7.5 MG tablet, Take 1 tablet (7.5 mg total) by mouth daily for 14 days., Disp: 14 tablet, Rfl: 0 ?  Tiotropium Bromide Monohydrate (SPIRIVA RESPIMAT) 2.5 MCG/ACT AERS, Inhale 2 puffs into the lungs daily., Disp: 1 each, Rfl: 5  ? ? ?Assessment & Plan:  ?Leg pain: right lower ext  no bruising ?Will need to   ? ?B12 deficiency ? Nutritional most likely  ?Low levels  ? ?Abdominal mass ?Has had surgery for " had a kidney and bladder made out of his intestines - 25 yrs ago" pt delcined multuple referals in the past   ?Will refer to general surgery as abdominal mass is still present and he is having nutritional issues.  ? ?COPD  ?Is on and albuterol for such  ?Will add spiriva ?Pt says trelegy was very expensive ? ? ?Problem List Items Addressed This Visit   ? ?  ? Other  ? Memory loss  ? ?Other Visit Diagnoses   ? ? B12 deficiency    -  Primary  ? Relevant Medications  ? cyanocobalamin ((VITAMIN  B-12)) injection 1,000 mcg (Completed)  ? Generalized abdominal mass      ? Relevant Orders  ? Ambulatory referral to General Surgery  ? ?  ?  ? ?Orders Placed This Encounter   ?Procedures  ? Ambulatory referral to General Surgery  ?  ? ?Meds ordered this encounter  ?Medications  ? meloxicam (MOBIC) 7.5 MG tablet  ?  Sig: Take 1 tablet (7.5 mg total) by mouth daily for 14 days.  ?  Dispense:  14 tablet  ?  Refill:  0  ? cyanocobalamin ((VITAMIN B-12)) injection 1,000 mcg  ? DISCONTD: Fluticasone-Umeclidin-Vilant (TRELEGY ELLIPTA) 100-62.5-25 MCG/ACT AEPB  ?  Sig: Inhale 1 puff into the lungs daily.  ?  Dispense:  1 each  ?  Refill:  11  ? Tiotropium Bromide Monohydrate (SPIRIVA RESPIMAT) 2.5 MCG/ACT AERS  ?  Sig: Inhale 2 puffs into the lungs daily.  ?  Dispense:  1 each  ?  Refill:  5  ?  ? ?Follow up plan: ?Return in about 3 months (around 09/26/2021). ? ? ? ?

## 2021-06-27 NOTE — Telephone Encounter (Signed)
Requested medication (s) are due for refill today: no ? ?Requested medication (s) are on the active medication list: yes ? ?Last refill:  today ? ?Future visit scheduled: yes ? ?Notes to clinic:   ?  Possible duplicate: Hover to review recent actions on this medication  ? Sig: Inhale 2 puffs into the lungs daily.  ? Disp:  1 each    Refills:  5  ? Start: 06/27/2021  ? Class: Normal  ? Last ordered: Today by Charlynne Cousins, MD   ? Rx #: [id number]  ? Pharmacy comment: [place] S/E ALSO $$$$$ - DO YOU WANT HIM  ON BOTH?? PLUS HE CANT AFFORED EITHER ? HES NOT GOING TO GET ANYTHING BUT ALBUTEROL MDI-- PLEASE PROVIDE PT ASSISTANCE.  ? ? ? ?  ?Requested Prescriptions  ?Pending Prescriptions Disp Refills  ? [place] 2.5 MCG/ACT AERS [Pharmacy Med Name: Tiotropium Bromide Monohydrate (SPIRIVA RESPIMAT) 2.5 MCG/ACT Aero Soln] 1 each 5  ?  Sig: Inhale 2 puffs into the lungs daily.  ?  ? Pulmonology:  Anticholinergic Agents Passed - 06/27/2021 11:31 AM  ?  ?  Passed - Valid encounter within last 12 months  ?  Recent Outpatient Visits   ? ?      ? Today B12 deficiency  ? [facility] Vigg, Avanti, MD  ? 1 month ago Tooth ache  ? [facility] Vigg, Avanti, MD  ? 3 months ago Atrial fibrillation, unspecified type ([place])  ? [facility] Vigg, Avanti, MD  ? 3 months ago Essential hypertension  ? [facility], Lauren A, NP  ? 5 months ago Cough, unspecified type  ? [facility] Vigg, Avanti, MD  ? ?  ?  ?Future Appointments   ? ?        ? In 2 months Vigg, Avanti, MD [facility], [facility]  ? In 4 months  [place], [facility]  ? ?  ? ?  ?  ?  ? ?

## 2021-06-28 ENCOUNTER — Ambulatory Visit: Payer: Medicare HMO | Admitting: Podiatry

## 2021-06-29 ENCOUNTER — Telehealth: Payer: Self-pay

## 2021-06-29 NOTE — Chronic Care Management (AMB) (Signed)
? ? ?  Chronic Care Management ?Pharmacy Assistant  ? ?Name: Brady Smith  MRN: [id number] DOB: [birth year] ? ?Reason for Encounter: Disease State General and Patient assistance application on [place] ? ? ?Recent office visits:  ?06/27/21-Avanti Vigg, MD (PCP) Seen for leg pain. Ambulatory referral to General Surgery for generalized abdominal mass. Start on Spiriva 2.5 mcg inhale 2 puffs daily. Follow up in 3 months. ?05/28/21-Avanti Vigg, MD (PCP) Seen for hand pain and dental pain. Start on Meloxicam 7.5 mg for 7 days. ?03/22/21-Avanti Vigg, MD (PCP) Seen for hypertension and foot pain. Follow up in 6 months. ?03/05/21-Lauren McElwee, NP. Hospital follow up visit. Start on Lisinopril 5 mg daily. Follow up in 2 weeks. ?01/05/21-Avanti Vigg, MD (PCP) Seen for a cough. Ambulatory referral to Dermatology. ? ?Recent consult visits:  ?03/27/21-Kevin P. Posey Pronto, DPM (Podiatry) Seen for a nail problem. Follow up in 6 months. ? ?Hospital visits:  ?Medication Reconciliation was completed by comparing discharge summary, patient?s EMR and Pharmacy list, and upon discussion with patient. ? ?Admitted to the hospital on 03/03/21 due to shortness of breath. Discharge date was 03/03/21. Discharged from [facility].   ? ?New?Medications Started at [facility] Discharge:?? ?-started none noted ? ?Medication Changes at Hospital Discharge: ?-Changed none noted ? ?Medications Discontinued at Hospital Discharge: ?-Stopped none noted ?Medications that remain the same after Hospital Discharge:??  ?-All other medications will remain the same.   ? ?Medications: ?Outpatient Encounter Medications as of 06/29/2021  ?Medication Sig  ? albuterol (VENTOLIN HFA) 108 (90 Base) MCG/ACT inhaler Inhale into the lungs.  ? apixaban (ELIQUIS) 5 MG TABS tablet Take 1 tablet (5 mg total) by mouth 2 (two) times daily.  ? lisinopril (ZESTRIL) 5 MG tablet Take 1 tablet (5 mg total) by mouth daily.  ? meloxicam (MOBIC) 7.5 MG tablet Take 1 tablet (7.5 mg  total) by mouth daily for 14 days.  ? Tiotropium Bromide Monohydrate (SPIRIVA RESPIMAT) 2.5 MCG/ACT AERS Inhale 2 puffs into the lungs daily.  ? ?No facility-administered encounter medications on file as of 06/29/2021.  ? ?Have you had any problems recently with your health? ?Patient states Brady Smith has no problems with his health ? ?Have you had any problems with your pharmacy? ?Patient states Brady Smith has no problems with his pharmacy. ? ?What issues or side effects are you having with your medications? ?Patient states Brady Smith has no issues or side effects to any of his medications. ? ?What would you like me to pass along to Edison Nasuti Potts,CPP for them to help you with?  ?Patient states there is nothing at this time. ? ?What can we do to take care of you better? ?Patient states there is nothing at this time. ? ?I have prefilled and mail out patient assistance application for patient. I have explained to him that Brady Smith will will need to complete the highlighted areas of the form sign and date, once completed Brady Smith will need to bring the form back to the office so his PCP can sign as well. Patient understood.  ? ? ?Care Gaps: ?Zoster Vaccines:Never done ? ? ?[name] Rating Drugs: ?Lisinopril 5 mg Last filled:05/03/21 30 DS ? ?Corrie Mckusick, RMA ?Health Concierge ? ?

## 2021-07-09 ENCOUNTER — Ambulatory Visit: Payer: Medicare PPO

## 2021-07-10 ENCOUNTER — Ambulatory Visit: Payer: Medicare PPO | Admitting: Surgery

## 2021-07-26 ENCOUNTER — Ambulatory Visit (INDEPENDENT_AMBULATORY_CARE_PROVIDER_SITE_OTHER): Payer: Medicare PPO

## 2021-07-26 DIAGNOSIS — E538 Deficiency of other specified B group vitamins: Secondary | ICD-10-CM

## 2021-07-26 DIAGNOSIS — J441 Chronic obstructive pulmonary disease with (acute) exacerbation: Secondary | ICD-10-CM | POA: Diagnosis not present

## 2021-07-26 MED ORDER — CYANOCOBALAMIN 1000 MCG/ML IJ SOLN
1000.0000 ug | Freq: Once | INTRAMUSCULAR | Status: AC
  Administered 2021-07-26: 1000 ug via INTRAMUSCULAR

## 2021-07-26 NOTE — Progress Notes (Signed)
Patient presents today for B-12 injection was given in right deltoid, patient tolerated well.  ?

## 2021-08-01 ENCOUNTER — Ambulatory Visit (INDEPENDENT_AMBULATORY_CARE_PROVIDER_SITE_OTHER): Payer: Medicare PPO | Admitting: Internal Medicine

## 2021-08-01 VITALS — BP 126/85 | HR 83 | Temp 97.8°F | Ht 66.14 in | Wt 162.4 lb

## 2021-08-01 DIAGNOSIS — R21 Rash and other nonspecific skin eruption: Secondary | ICD-10-CM | POA: Diagnosis not present

## 2021-08-01 MED ORDER — FEXOFENADINE HCL 180 MG PO TABS: 180.0000 mg | ORAL_TABLET | Freq: Every day | ORAL | 1 refills | Status: AC

## 2021-08-01 MED ORDER — PERMETHRIN 0.25 % LIQD: 1.0000 "application " | Freq: Once | 0 refills | Status: AC

## 2021-08-01 MED ORDER — CALAMINE EX LOTN: 1.0000 "application " | TOPICAL_LOTION | CUTANEOUS | 1 refills | Status: AC | PRN

## 2021-08-01 NOTE — Progress Notes (Signed)
? ?BP 126/85   Pulse 83   Temp 97.8 ?F (36.6 ?C) (Oral)   Ht 5' 6.14" (1.68 m)   Wt 162 lb 6.4 oz (73.7 kg)   SpO2 100%   BMI 26.10 kg/m?   ? ?Subjective:  ? ? Patient ID: Brady Smith, male    DOB: [birth year], 76 y.o.   MRN: [id number] ? ?Chief Complaint  ?Patient presents with  ?? Shortness of Breath  ?? Problems Walking  ? ? ?HPI: ?Brady Smith is a 75 y.o. male ? ?Rash ?This is a new problem. The current episode started in the past 7 days. The affected locations include the chest. Pertinent negatives include no anorexia, congestion, cough, diarrhea, eye pain, facial edema, fatigue, fever, joint pain, nail changes, rhinorrhea, shortness of breath, sore throat or vomiting.  ? ?Chief Complaint  ?Patient presents with  ?? Shortness of Breath  ?? Problems Walking  ? ? ?Relevant past medical, surgical, family and social history reviewed and updated as indicated. Interim medical history since our last visit reviewed. ?Allergies and medications reviewed and updated. ? ?Review of Systems  ?Constitutional:  Negative for fatigue and fever.  ?HENT:  Negative for congestion, rhinorrhea and sore throat.   ?Eyes:  Negative for pain.  ?Respiratory:  Negative for cough and shortness of breath.   ?Gastrointestinal:  Negative for anorexia, diarrhea and vomiting.  ?Musculoskeletal:  Negative for joint pain.  ?Skin:  Positive for rash. Negative for nail changes.  ? ?Per HPI unless specifically indicated above ? ?   ?Objective:  ?  ?BP 126/85   Pulse 83   Temp 97.8 ?F (36.6 ?C) (Oral)   Ht 5' 6.14" (1.68 m)   Wt 162 lb 6.4 oz (73.7 kg)   SpO2 100%   BMI 26.10 kg/m?   ?Wt Readings from Last 3 Encounters:  ?08/01/21 162 lb 6.4 oz (73.7 kg)  ?06/27/21 164 lb (74.4 kg)  ?05/28/21 166 lb 12.8 oz (75.7 kg)  ?  ?Physical Exam ? ?Results for orders placed or performed in visit on 05/28/21  ?Bayer DCA Hb A1c Waived (STAT)  ?Result Value Ref Range  ? HB A1C (BAYER DCA - WAIVED) 5.5 4.8 - 5.6 %  ?TSH  ?Result Value Ref Range  ? TSH  1.130 0.450 - 4.500 uIU/mL  ?B12  ?Result Value Ref Range  ? Vitamin B-12 202 (L) 232 - 1,245 pg/mL  ?Comprehensive metabolic panel  ?Result Value Ref Range  ? Glucose 102 (H) 70 - 99 mg/dL  ? BUN 18 8 - 27 mg/dL  ? Creatinine, Ser 1.23 0.76 - 1.27 mg/dL  ? eGFR 61 >59 mL/min/1.73  ? BUN/Creatinine Ratio 15 10 - 24  ? Sodium 146 (H) 134 - 144 mmol/L  ? Potassium 4.6 3.5 - 5.2 mmol/L  ? Chloride 107 (H) 96 - 106 mmol/L  ? CO2 22 20 - 29 mmol/L  ? Calcium 9.3 8.6 - 10.2 mg/dL  ? Total Protein 6.1 6.0 - 8.5 g/dL  ? Albumin 4.2 3.7 - 4.7 g/dL  ? Globulin, Total 1.9 1.5 - 4.5 g/dL  ? Albumin/Globulin Ratio 2.2 1.2 - 2.2  ? Bilirubin Total 0.4 0.0 - 1.2 mg/dL  ? Alkaline Phosphatase 75 44 - 121 IU/L  ? AST 13 0 - 40 IU/L  ? ALT 15 0 - 44 IU/L  ?Urinalysis, Routine w reflex microscopic  ?Result Value Ref Range  ? Specific Gravity, UA 1.020 1.005 - 1.030  ? pH, UA 7.0 5.0 - 7.5  ?  Color, UA Yellow Yellow  ? Appearance Ur Clear Clear  ? Leukocytes,UA Negative Negative  ? Protein,UA Negative Negative/Trace  ? Glucose, UA Negative Negative  ? Ketones, UA Negative Negative  ? RBC, UA Negative Negative  ? Bilirubin, UA Negative Negative  ? Urobilinogen, Ur 0.2 0.2 - 1.0 mg/dL  ? Nitrite, UA Negative Negative  ? ?   ? ? ?Current Outpatient Medications:  ??  albuterol (VENTOLIN HFA) 108 (90 Base) MCG/ACT inhaler, Inhale into the lungs., Disp: , Rfl:  ??  calamine lotion, Apply 1 application. topically as needed for itching., Disp: 177 mL, Rfl: 1 ??  fexofenadine (ALLEGRA ALLERGY) 180 MG tablet, Take 1 tablet (180 mg total) by mouth daily., Disp: 10 tablet, Rfl: 1 ??  gabapentin (NEURONTIN) 100 MG capsule, Take by mouth., Disp: , Rfl:  ??  lisinopril (ZESTRIL) 5 MG tablet, Take 1 tablet (5 mg total) by mouth daily., Disp: 30 tablet, Rfl: 9 ??  permethrin (NIX) 0.25 % LIQD, Apply 1 application. topically once for 1 dose., Disp: 74 mL, Rfl: 0 ??  Tiotropium Bromide Monohydrate (SPIRIVA RESPIMAT) 2.5 MCG/ACT AERS, Inhale 2 puffs into  the lungs daily., Disp: 1 each, Rfl: 5 ??  apixaban (ELIQUIS) 5 MG TABS tablet, Take 1 tablet (5 mg total) by mouth 2 (two) times daily., Disp: 60 tablet, Rfl: 3  ? ? ?Assessment & Plan:  ?Rash on chest ? Bed bugs vs scabies.  ?Will allegra and permethrin for such 1 time application  ? ? ? ? ?Problem List Items Addressed This Visit   ? ?  ? Musculoskeletal and Integument  ? Rash and nonspecific skin eruption - Primary  ?  ? ?No orders of the defined types were placed in this encounter. ?  ? ?Meds ordered this encounter  ?Medications  ?? fexofenadine (ALLEGRA ALLERGY) 180 MG tablet  ?  Sig: Take 1 tablet (180 mg total) by mouth daily.  ?  Dispense:  10 tablet  ?  Refill:  1  ?? permethrin (NIX) 0.25 % LIQD  ?  Sig: Apply 1 application. topically once for 1 dose.  ?  Dispense:  74 mL  ?  Refill:  0  ?? calamine lotion  ?  Sig: Apply 1 application. topically as needed for itching.  ?  Dispense:  177 mL  ?  Refill:  1  ?  ? ?Follow up plan: ?Return in about 3 months (around 11/01/2021). ? ? ?

## 2021-08-26 DIAGNOSIS — J441 Chronic obstructive pulmonary disease with (acute) exacerbation: Secondary | ICD-10-CM | POA: Diagnosis not present

## 2021-08-27 ENCOUNTER — Telehealth: Payer: Self-pay

## 2021-08-27 NOTE — Chronic Care Management (AMB) (Signed)
Chronic Care Management Pharmacy Assistant   Name: Brady Smith  MRN: [id number] DOB: [birth year]  Reason for Encounter: Disease State General     Recent office visits:  08/01/21-Avanti Vigg, MD (PCP) Seen for shortness of breath and problems walking. Follow up in 3 months.  06/27/21-Avanti Vigg, MD (PCP) Seen for leg pain. Ambulatory referral to General Surgery. Start on Spirivia 2.5 mg. Follow up in 3 months. 05/28/21-Avanti Vigg, MD (PCP) Seen for head pain and dental pain. Start on Meloxicam 7.5 mg.  03/22/21-Avanti Vigg, MD (PCP) Seen for hypertension and foot pain. Follow up in 6 months.  03/05/21-Lauren Avelina Laine, NP. Hospital follow up visit. Will start lisinopril '5mg'$  daily. Follow up in 2 weeks.   Recent consult visits:  03/27/21-Kevin P. Posey Pronto, DPM (Podiatry) Seen for a nail problem. Follow up in 6 months.  Hospital visits:  Medication Reconciliation was completed by comparing discharge summary, patient's EMR and Pharmacy list, and upon discussion with patient.  Admitted to the hospital on 03/03/21 due to shortness of breath. Discharge date was 02/22/21. Discharged from [place]?Medications Started at [facility] Discharge:?? -started none noted  Medication Changes at Hospital Discharge: -Changed  none noted  Medications Discontinued at Hospital Discharge: -Stopped  none noted  Medications that remain the same after Hospital Discharge:??  -All other medications will remain the same.    Medications: Outpatient Encounter Medications as of 08/27/2021  Medication Sig   albuterol (VENTOLIN HFA) 108 (90 Base) MCG/ACT inhaler Inhale into the lungs.   apixaban (ELIQUIS) 5 MG TABS tablet Take 1 tablet (5 mg total) by mouth 2 (two) times daily.   calamine lotion Apply 1 application. topically as needed for itching.   fexofenadine (ALLEGRA ALLERGY) 180 MG tablet Take 1 tablet (180 mg total) by mouth daily.   gabapentin (NEURONTIN) 100 MG capsule Take  by mouth.   lisinopril (ZESTRIL) 5 MG tablet Take 1 tablet (5 mg total) by mouth daily.   Tiotropium Bromide Monohydrate (SPIRIVA RESPIMAT) 2.5 MCG/ACT AERS Inhale 2 puffs into the lungs daily.   No facility-administered encounter medications on file as of 08/27/2021.   [place] for General Review Call   Chart Review:  Have there been any documented new, changed, or discontinued medications since last visit? Yes (If yes, include name, dose, frequency, date) 06/27/21:Start on Spirivia 2.5 mg. 05/28/21:Start on Meloxicam 7.5 mg. 03/05/21:Will start lisinopril '5mg'$  daily.  Has there been any documented recent hospitalizations or ED visits since last visit with Clinical Pharmacist? Yes   Adherence Review:  Does the Clinical Pharmacist Assistant have access to adherence rates? Yes  Adherence rates for [name] metric medications (List medication(s)/day supply/ last 2 fill dates). See start list note  Does the patient have >5 day gap between last estimated fill dates for any of the above medications or other medication gaps? No    Disease State Questions:  Able to connect with Patient? Yes Did patient have any problems with their health recently? No Note problems and Concerns:Patient states he has no new problems or concerns,  Have you had any admissions or emergency room visits or worsening of your condition(s) since last visit? Yes Details of ED visit, hospital visit and/or worsening condition(s):N/a  Have you had any visits with new specialists or providers since your last visit? No Explain:N/a  Have you had any new health care problem(s) since your last visit? No New problem(s) reported:Patient states he has no new health care problems.  Have you run out of any of your medications since you last spoke with clinical pharmacist? No What caused you to run out of your medications? Patient states he has not ran out of any of his medications.  Are there any medications you  are not taking as prescribed? No What kept you from taking your medications as prescribed? N/a  Are you having any issues or side effects with your medications? No Note of issues or side effects:Patient states he has no issues or side effects to any of his medications.  Do you have any other health concerns or questions you want to discuss with your Clinical Pharmacist before your next visit? No Note additional concerns and questions from Patient. Patient states there is nothing at this time.  Are there any health concerns that you feel we can do a better job addressing? No Note Patient's response. Patient states there is nothing at this time.  Are you having any problems with any of the following since the last visit: (select all that apply)  None  Details:N/a  12. Any falls since last visit? No  Details:N/a  13. Any increased or uncontrolled pain since last visit? No  Details:N/a  14. Next visit Type: office       Visit with:Avanti Vigg, MD        Date:09/20/21        Time:8:20  15. Additional Details? No    Care Gaps: Zoster Vaccines:Never done  [name] Rating Drugs: Lisinopril 5 mg Last filled:06/29/21 30 DS  Myriam Elta [place], [place]

## 2021-08-28 ENCOUNTER — Telehealth: Payer: Self-pay | Admitting: Internal Medicine

## 2021-08-28 ENCOUNTER — Ambulatory Visit: Payer: Medicare PPO | Admitting: Internal Medicine

## 2021-08-28 NOTE — Telephone Encounter (Signed)
Routing to provider and Education officer, museum.

## 2021-08-28 NOTE — Telephone Encounter (Signed)
Pt needs transportation to his appt Friday, June 9.  He needs his B12.

## 2021-08-30 ENCOUNTER — Other Ambulatory Visit: Payer: Self-pay | Admitting: Internal Medicine

## 2021-08-31 ENCOUNTER — Ambulatory Visit: Payer: Medicare PPO

## 2021-08-31 NOTE — Telephone Encounter (Signed)
Requested medication (s) are due for refill today: Yes  Requested medication (s) are on the active medication list: No  Last refill:  06/27/21  Future visit scheduled: Yes  Notes to clinic:  Medication not on list.    Requested Prescriptions  Pending Prescriptions Disp Refills   meloxicam (MOBIC) 7.5 MG tablet [Pharmacy Med Name: MELOXICAM 7.5 MG TABLET] 14 tablet 0    Sig: Take 1 tablet (7.5 mg total) by mouth daily for 14 days.     Analgesics:  COX2 Inhibitors Failed - 08/30/2021 12:34 PM      Failed - Manual Review: Labs are only required if the patient has taken medication for more than 8 weeks.      Passed - HGB in normal range and within 360 days    Hemoglobin  Date Value Ref Range Status  03/03/2021 16.9 13.0 - 17.0 g/dL Final  10/31/2020 16.7 13.0 - 17.7 g/dL Final         Passed - Cr in normal range and within 360 days    Creatinine  Date Value Ref Range Status  07/28/2013 1.13 0.60 - 1.30 mg/dL Final   Creatinine, Ser  Date Value Ref Range Status  05/28/2021 1.23 0.76 - 1.27 mg/dL Final         Passed - HCT in normal range and within 360 days    HCT  Date Value Ref Range Status  03/03/2021 51.1 39.0 - 52.0 % Final   Hematocrit  Date Value Ref Range Status  10/31/2020 50.7 37.5 - 51.0 % Final         Passed - AST in normal range and within 360 days    AST  Date Value Ref Range Status  05/28/2021 13 0 - 40 IU/L Final   SGOT(AST)  Date Value Ref Range Status  07/27/2013 17 15 - 37 Unit/L Final         Passed - ALT in normal range and within 360 days    ALT  Date Value Ref Range Status  05/28/2021 15 0 - 44 IU/L Final   SGPT (ALT)  Date Value Ref Range Status  07/27/2013 20 12 - 78 U/L Final         Passed - eGFR is 30 or above and within 360 days    EGFR (African American)  Date Value Ref Range Status  07/28/2013 >60  Final   GFR calc Af Amer  Date Value Ref Range Status  12/27/2019 >60 >60 mL/min Final   EGFR (Non-African Amer.)  Date  Value Ref Range Status  07/28/2013 >60  Final    Comment:    eGFR values <60mL/min/1.73 m2 may be an indication of chronic kidney disease (CKD). Calculated eGFR is useful in patients with stable renal function. The eGFR calculation will not be reliable in acutely ill patients when serum creatinine is changing rapidly. It is not useful in  patients on dialysis. The eGFR calculation may not be applicable to patients at the low and high extremes of body sizes, pregnant women, and vegetarians.    GFR, Estimated  Date Value Ref Range Status  03/03/2021 >60 >60 mL/min Final    Comment:    (NOTE) Calculated using the CKD-EPI Creatinine Equation (2021)    eGFR  Date Value Ref Range Status  05/28/2021 61 >59 mL/min/1.73 Final         Passed - Patient is not pregnant      Passed - Valid encounter within last 12 months      Recent Outpatient Visits           1 month ago Rash and nonspecific skin eruption   [facility] Vigg, Avanti, MD   2 months ago B12 deficiency   [facility] Vigg, Avanti, MD   3 months ago Tooth ache   [facility] Vigg, Avanti, MD   5 months ago Atrial fibrillation, unspecified type (HCC)   [facility] Vigg, Avanti, MD   5 months ago Essential hypertension   [facility] McElwee, Lauren A, NP       Future Appointments             In 2 weeks Vigg, Avanti, MD [facility]   In 1 month  [facility]             

## 2021-09-03 ENCOUNTER — Ambulatory Visit (INDEPENDENT_AMBULATORY_CARE_PROVIDER_SITE_OTHER): Payer: Medicare PPO

## 2021-09-03 DIAGNOSIS — E538 Deficiency of other specified B group vitamins: Secondary | ICD-10-CM | POA: Diagnosis not present

## 2021-09-03 MED ORDER — CYANOCOBALAMIN 1000 MCG/ML IJ SOLN
1000.0000 ug | Freq: Once | INTRAMUSCULAR | Status: AC
  Administered 2021-09-03: 1000 ug via INTRAMUSCULAR

## 2021-09-19 NOTE — Telephone Encounter (Addendum)
Please close this for me, as you will be finishing with CFP, I am unable to sign/close this old message.

## 2021-09-20 ENCOUNTER — Ambulatory Visit: Payer: Medicare HMO | Admitting: Internal Medicine

## 2021-09-22 DEATH — deceased

## 2021-10-02 ENCOUNTER — Other Ambulatory Visit: Payer: Self-pay

## 2021-10-02 DIAGNOSIS — E538 Deficiency of other specified B group vitamins: Secondary | ICD-10-CM

## 2021-10-02 MED ORDER — CYANOCOBALAMIN 1000 MCG/ML IJ SOLN: 1000.0000 ug | Freq: Once | INTRAMUSCULAR | Status: AC

## 2021-10-03 ENCOUNTER — Ambulatory Visit: Payer: Medicare PPO

## 2021-10-08 ENCOUNTER — Telehealth: Payer: Self-pay | Admitting: Licensed Clinical Social Worker

## 2021-10-08 NOTE — Telephone Encounter (Signed)
    Clinical Social Work  Care Management   Phone Outreach    10/08/2021 Name: Brady Smith MRN: [id number] DOB: 1946-02-05  [name] is a 76 y.o. year old male who is a primary care patient of [facility], [facility] .   Reason for referral: Transportation Needs .    F/U phone call today to assess needs, progress and barriers with care plan goals.   Telephone outreach was unsuccessful. Unable to leave a HIPPA compliant phone message due to voice mail not set up.  Plan:CCM LCSW will wait for return call.  Review of patient status, including review of consultants reports, relevant laboratory and other test results, and collaboration with appropriate care team members and the patient's provider was performed as part of comprehensive patient evaluation and provision of care management services.    Christa See, MSW, [email or web address] Phone [phone] 9:17 AM

## 2021-10-22 ENCOUNTER — Telehealth: Payer: Self-pay

## 2021-10-22 ENCOUNTER — Telehealth: Payer: Medicare PPO

## 2021-10-22 NOTE — Telephone Encounter (Signed)
  Care Management   Follow Up Note   10/22/2021 Name: Brady Smith MRN: [id number] DOB: 08-12-1945   Referred by: [facility] Reason for referral : No chief complaint on file.   An unsuccessful telephone outreach was attempted today. The patient was referred to the case management team for assistance with care management and care coordination.   Follow Up Plan: The patient has been provided with contact information for the care management team and has been advised to call with any health related questions or concerns.   [place] Constable, Pharm.D. - [phone]

## 2021-10-29 ENCOUNTER — Ambulatory Visit: Payer: Self-pay

## 2021-11-14 NOTE — Progress Notes (Signed)
I have attempted to reschedule patients appointment with the clinical pharmacist but patient has call restrictions and unable to leave voicemail message to return phone call.  Brady Smith, [place]

## 2021-11-27 ENCOUNTER — Telehealth: Payer: Self-pay

## 2021-11-27 NOTE — Progress Notes (Signed)
Chronic Care Management Pharmacy Assistant   Name: Brady Smith  MRN: [id number] DOB: 1945/11/29   Reason for Encounter: Disease State-Hypertension   Recent office visits:  08/01/21 Charlynne Cousins, MD-Internal Medicine (Sob, problems walking, Rash) Orders: none, Medication changes: calamine-Zinc Oxide 1 application, fexofenadine 180 mg, permethrin 6.26% 1 application   Recent consult visits:  None since last coordination call on 06/29/21  Hospital visits:  None since last coordination call on 06/29/21  Medications: Outpatient Encounter Medications as of 11/27/2021  Medication Sig   albuterol (VENTOLIN HFA) 108 (90 Base) MCG/ACT inhaler Inhale into the lungs.   apixaban (ELIQUIS) 5 MG TABS tablet Take 1 tablet (5 mg total) by mouth 2 (two) times daily.   calamine lotion Apply 1 application. topically as needed for itching.   fexofenadine (ALLEGRA ALLERGY) 180 MG tablet Take 1 tablet (180 mg total) by mouth daily.   gabapentin (NEURONTIN) 100 MG capsule Take by mouth.   lisinopril (ZESTRIL) 5 MG tablet Take 1 tablet (5 mg total) by mouth daily.   Tiotropium Bromide Monohydrate (SPIRIVA RESPIMAT) 2.5 MCG/ACT AERS Inhale 2 puffs into the lungs daily.   Facility-Administered Encounter Medications as of 11/27/2021  Medication   cyanocobalamin ((VITAMIN B-12)) injection 1,000 mcg    Recent Office Vitals: BP Readings from Last 3 Encounters:  08/01/21 126/85  06/27/21 138/87  05/28/21 132/78   Pulse Readings from Last 3 Encounters:  08/01/21 83  06/27/21 70  05/28/21 92    Wt Readings from Last 3 Encounters:  08/01/21 162 lb 6.4 oz (73.7 kg)  06/27/21 164 lb (74.4 kg)  05/28/21 166 lb 12.8 oz (75.7 kg)     Kidney Function Lab Results  Component Value Date/Time   CREATININE 1.23 05/28/2021 03:41 PM   CREATININE 0.94 03/03/2021 01:33 PM   CREATININE 1.13 07/28/2013 04:44 AM   CREATININE 1.26 07/27/2013 10:41 AM   GFRNONAA >60 03/03/2021 01:33 PM   GFRNONAA >60 07/28/2013  04:44 AM   GFRAA >60 12/27/2019 06:48 AM   GFRAA >60 07/28/2013 04:44 AM       Latest Ref Rng & Units 05/28/2021    3:41 PM 03/03/2021    1:33 PM 10/31/2020    9:20 AM  BMP  Glucose 70 - 99 mg/dL 102  79  85   BUN 8 - 27 mg/dL '18  14  12   '$ Creatinine 0.76 - 1.27 mg/dL 1.23  0.94  1.04   BUN/Creat Ratio 10 - '24 15   12   '$ Sodium 134 - 144 mmol/L 146  140  142   Potassium 3.5 - 5.2 mmol/L 4.6  4.7  4.2   Chloride 96 - 106 mmol/L 107  108  106   CO2 20 - 29 mmol/L '22  25  19   '$ Calcium 8.6 - 10.2 mg/dL 9.3  9.4  8.8    Reviewed chart prior to disease state call. 3 attempts made to speak with patient regarding BP.  Current antihypertensive regimen:  Lisinopril 5 mg Take 1 tab daily   What recent interventions/DTPs have been made by any provider to improve Blood Pressure control since last CPP Visit: None since last coordination call  Any recent hospitalizations or ED visits since last visit with CPP? None since last coordination call   Adherence Review: Is the patient currently on ACE/ARB medication? Yes Does the patient have >5 day gap between last estimated fill dates? No   Care Gaps: Colonoscopy-NA Diabetic Foot Exam-NA Ophthalmology-NA Dexa Scan - NA  Annual Well Visit - 10/27/20 (Medicare)  Micro albumin-NA Hemoglobin A1c- 05/28/21  [name] Rating Drugs: Lisinopril 5 mg-last fill 08/29/21 90 ds, 08/01/21 30 ds  Ethelene Hal Clinical Pharmacist Assistant [phone]
